# Patient Record
Sex: Male | Born: 1950
Health system: Southern US, Community
[De-identification: ages and names within clinical notes are randomized; demographics above are authoritative.]

## PROBLEM LIST (undated history)

## (undated) DIAGNOSIS — R972 Elevated prostate specific antigen [PSA]: Secondary | ICD-10-CM

## (undated) DIAGNOSIS — K649 Unspecified hemorrhoids: Secondary | ICD-10-CM

## (undated) DIAGNOSIS — G20 Parkinson's disease: Secondary | ICD-10-CM

## (undated) DIAGNOSIS — I2510 Atherosclerotic heart disease of native coronary artery without angina pectoris: Secondary | ICD-10-CM

## (undated) DIAGNOSIS — F32A Depression, unspecified: Secondary | ICD-10-CM

## (undated) DIAGNOSIS — Z9289 Personal history of other medical treatment: Secondary | ICD-10-CM

## (undated) DIAGNOSIS — Z87448 Personal history of other diseases of urinary system: Secondary | ICD-10-CM

## (undated) DIAGNOSIS — I5189 Other ill-defined heart diseases: Secondary | ICD-10-CM

## (undated) DIAGNOSIS — M51369 Other intervertebral disc degeneration, lumbar region without mention of lumbar back pain or lower extremity pain: Secondary | ICD-10-CM

## (undated) DIAGNOSIS — R0600 Dyspnea, unspecified: Secondary | ICD-10-CM

## (undated) DIAGNOSIS — F410 Panic disorder [episodic paroxysmal anxiety] without agoraphobia: Secondary | ICD-10-CM

## (undated) DIAGNOSIS — R351 Nocturia: Secondary | ICD-10-CM

## (undated) DIAGNOSIS — E785 Hyperlipidemia, unspecified: Secondary | ICD-10-CM

## (undated) DIAGNOSIS — N3000 Acute cystitis without hematuria: Secondary | ICD-10-CM

## (undated) DIAGNOSIS — I639 Cerebral infarction, unspecified: Secondary | ICD-10-CM

## (undated) DIAGNOSIS — F319 Bipolar disorder, unspecified: Secondary | ICD-10-CM

## (undated) DIAGNOSIS — I119 Hypertensive heart disease without heart failure: Secondary | ICD-10-CM

## (undated) DIAGNOSIS — E559 Vitamin D deficiency, unspecified: Secondary | ICD-10-CM

## (undated) DIAGNOSIS — G629 Polyneuropathy, unspecified: Secondary | ICD-10-CM

## (undated) DIAGNOSIS — I10 Essential (primary) hypertension: Secondary | ICD-10-CM

## (undated) DIAGNOSIS — G20A1 Parkinson's disease without dyskinesia, without mention of fluctuations: Secondary | ICD-10-CM

## (undated) DIAGNOSIS — M1990 Unspecified osteoarthritis, unspecified site: Secondary | ICD-10-CM

## (undated) DIAGNOSIS — E119 Type 2 diabetes mellitus without complications: Secondary | ICD-10-CM

## (undated) DIAGNOSIS — R42 Dizziness and giddiness: Secondary | ICD-10-CM

## (undated) DIAGNOSIS — G5600 Carpal tunnel syndrome, unspecified upper limb: Secondary | ICD-10-CM

## (undated) DIAGNOSIS — R399 Unspecified symptoms and signs involving the genitourinary system: Secondary | ICD-10-CM

## (undated) DIAGNOSIS — R1319 Other dysphagia: Secondary | ICD-10-CM

## (undated) DIAGNOSIS — F329 Major depressive disorder, single episode, unspecified: Secondary | ICD-10-CM

## (undated) DIAGNOSIS — I209 Angina pectoris, unspecified: Secondary | ICD-10-CM

## (undated) DIAGNOSIS — N400 Enlarged prostate without lower urinary tract symptoms: Secondary | ICD-10-CM

## (undated) DIAGNOSIS — N35919 Unspecified urethral stricture, male, unspecified site: Secondary | ICD-10-CM

## (undated) DIAGNOSIS — F429 Obsessive-compulsive disorder, unspecified: Secondary | ICD-10-CM

## (undated) DIAGNOSIS — K219 Gastro-esophageal reflux disease without esophagitis: Secondary | ICD-10-CM

## (undated) DIAGNOSIS — G4730 Sleep apnea, unspecified: Secondary | ICD-10-CM

## (undated) DIAGNOSIS — M5136 Other intervertebral disc degeneration, lumbar region: Secondary | ICD-10-CM

## (undated) DIAGNOSIS — D649 Anemia, unspecified: Secondary | ICD-10-CM

## (undated) DIAGNOSIS — R339 Retention of urine, unspecified: Secondary | ICD-10-CM

## (undated) DIAGNOSIS — R109 Unspecified abdominal pain: Secondary | ICD-10-CM

## (undated) DIAGNOSIS — D509 Iron deficiency anemia, unspecified: Secondary | ICD-10-CM

## (undated) DIAGNOSIS — Z87440 Personal history of urinary (tract) infections: Secondary | ICD-10-CM

## (undated) DIAGNOSIS — R319 Hematuria, unspecified: Secondary | ICD-10-CM

## (undated) DIAGNOSIS — Z87438 Personal history of other diseases of male genital organs: Secondary | ICD-10-CM

## (undated) DIAGNOSIS — I8390 Asymptomatic varicose veins of unspecified lower extremity: Secondary | ICD-10-CM

## (undated) DIAGNOSIS — J45909 Unspecified asthma, uncomplicated: Secondary | ICD-10-CM

## (undated) DIAGNOSIS — Z972 Presence of dental prosthetic device (complete) (partial): Secondary | ICD-10-CM

## (undated) DIAGNOSIS — I219 Acute myocardial infarction, unspecified: Secondary | ICD-10-CM

## (undated) HISTORY — DX: Vitamin D deficiency, unspecified: E55.9

## (undated) HISTORY — DX: Unspecified hemorrhoids: K64.9

## (undated) HISTORY — DX: Gastro-esophageal reflux disease without esophagitis: K21.9

## (undated) HISTORY — DX: Personal history of other medical treatment: Z92.89

## (undated) HISTORY — DX: Obsessive-compulsive disorder, unspecified: F42.9

## (undated) HISTORY — PX: SKIN GRAFT: SHX250

## (undated) HISTORY — DX: Other intervertebral disc degeneration, lumbar region without mention of lumbar back pain or lower extremity pain: M51.369

## (undated) HISTORY — DX: Unspecified abdominal pain: R10.9

## (undated) HISTORY — DX: Unspecified osteoarthritis, unspecified site: M19.90

## (undated) HISTORY — PX: BACK SURGERY: SHX140

## (undated) HISTORY — DX: Sleep apnea, unspecified: G47.30

## (undated) HISTORY — DX: Hyperlipidemia, unspecified: E78.5

## (undated) HISTORY — DX: Iron deficiency anemia, unspecified: D50.9

## (undated) HISTORY — DX: Nocturia: R35.1

## (undated) HISTORY — DX: Acute cystitis without hematuria: N30.00

## (undated) HISTORY — DX: Asymptomatic varicose veins of unspecified lower extremity: I83.90

## (undated) HISTORY — PX: CARDIAC CATHETERIZATION: SHX172

## (undated) HISTORY — DX: Elevated prostate specific antigen (PSA): R97.20

## (undated) HISTORY — DX: Personal history of other diseases of urinary system: Z87.448

## (undated) HISTORY — DX: Polyneuropathy, unspecified: G62.9

## (undated) HISTORY — DX: Retention of urine, unspecified: R33.9

## (undated) HISTORY — DX: Hematuria, unspecified: R31.9

## (undated) HISTORY — DX: Other dysphagia: R13.19

## (undated) HISTORY — PX: TONSILLECTOMY: SUR1361

## (undated) HISTORY — PX: UVULOPALATOPHARYNGOPLASTY: SHX827

## (undated) HISTORY — DX: Panic disorder (episodic paroxysmal anxiety): F41.0

## (undated) HISTORY — DX: Essential (primary) hypertension: I10

## (undated) HISTORY — DX: Unspecified urethral stricture, male, unspecified site: N35.919

## (undated) HISTORY — PX: CORONARY ARTERY BYPASS GRAFT: SHX141

## (undated) HISTORY — DX: Personal history of other diseases of male genital organs: Z87.438

## (undated) HISTORY — DX: Type 2 diabetes mellitus without complications: E11.9

## (undated) HISTORY — DX: Bipolar disorder, unspecified: F31.9

## (undated) HISTORY — DX: Hypertensive heart disease without heart failure: I11.9

## (undated) HISTORY — DX: Personal history of urinary (tract) infections: Z87.440

## (undated) HISTORY — PX: CORONARY ANGIOPLASTY: SHX604

## (undated) HISTORY — PX: CYST REMOVAL LEG: SHX6280

## (undated) HISTORY — DX: Carpal tunnel syndrome, unspecified upper limb: G56.00

## (undated) HISTORY — DX: Anemia, unspecified: D64.9

## (undated) HISTORY — DX: Unspecified symptoms and signs involving the genitourinary system: R39.9

## (undated) HISTORY — DX: Benign prostatic hyperplasia without lower urinary tract symptoms: N40.0

## (undated) HISTORY — DX: Other intervertebral disc degeneration, lumbar region: M51.36

## (undated) HISTORY — DX: Other ill-defined heart diseases: I51.89

---
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
## 1982-03-28 HISTORY — PX: BACK SURGERY: SHX140

## 2001-05-30 ENCOUNTER — Encounter: Admission: RE | Admit: 2001-05-30 | Discharge: 2001-05-30 | Payer: Self-pay | Admitting: *Deleted

## 2001-06-29 ENCOUNTER — Encounter: Admission: RE | Admit: 2001-06-29 | Discharge: 2001-06-29 | Payer: Self-pay | Admitting: *Deleted

## 2001-09-03 ENCOUNTER — Encounter: Admission: RE | Admit: 2001-09-03 | Discharge: 2001-09-03 | Payer: Self-pay | Admitting: *Deleted

## 2001-11-28 ENCOUNTER — Encounter: Admission: RE | Admit: 2001-11-28 | Discharge: 2001-11-28 | Payer: Self-pay | Admitting: *Deleted

## 2002-02-27 ENCOUNTER — Encounter: Admission: RE | Admit: 2002-02-27 | Discharge: 2002-02-27 | Payer: Self-pay | Admitting: *Deleted

## 2002-04-24 ENCOUNTER — Inpatient Hospital Stay (HOSPITAL_COMMUNITY): Admission: EM | Admit: 2002-04-24 | Discharge: 2002-04-27 | Payer: Self-pay | Admitting: *Deleted

## 2002-05-01 ENCOUNTER — Encounter: Admission: RE | Admit: 2002-05-01 | Discharge: 2002-05-01 | Payer: Self-pay | Admitting: *Deleted

## 2002-05-27 ENCOUNTER — Encounter: Admission: RE | Admit: 2002-05-27 | Discharge: 2002-05-27 | Payer: Self-pay | Admitting: *Deleted

## 2002-07-22 ENCOUNTER — Encounter: Admission: RE | Admit: 2002-07-22 | Discharge: 2002-07-22 | Payer: Self-pay | Admitting: *Deleted

## 2003-12-03 ENCOUNTER — Ambulatory Visit (HOSPITAL_COMMUNITY): Payer: Self-pay | Admitting: Psychiatry

## 2004-02-04 ENCOUNTER — Ambulatory Visit (HOSPITAL_COMMUNITY): Payer: Self-pay | Admitting: Psychiatry

## 2004-04-09 ENCOUNTER — Ambulatory Visit: Payer: Self-pay | Admitting: Internal Medicine

## 2004-05-19 ENCOUNTER — Ambulatory Visit (HOSPITAL_COMMUNITY): Payer: Self-pay | Admitting: Psychiatry

## 2004-06-08 ENCOUNTER — Ambulatory Visit: Payer: Self-pay | Admitting: Urology

## 2004-06-11 ENCOUNTER — Ambulatory Visit: Payer: Self-pay | Admitting: Urology

## 2004-07-26 ENCOUNTER — Ambulatory Visit: Payer: Self-pay | Admitting: Urology

## 2004-08-04 ENCOUNTER — Ambulatory Visit (HOSPITAL_COMMUNITY): Payer: Self-pay | Admitting: Psychiatry

## 2004-09-29 ENCOUNTER — Ambulatory Visit: Payer: Self-pay | Admitting: Internal Medicine

## 2004-10-05 ENCOUNTER — Ambulatory Visit: Payer: Self-pay | Admitting: Internal Medicine

## 2004-10-11 ENCOUNTER — Ambulatory Visit (HOSPITAL_COMMUNITY): Payer: Self-pay | Admitting: Psychiatry

## 2004-11-11 ENCOUNTER — Ambulatory Visit: Payer: Self-pay | Admitting: Urology

## 2004-11-15 ENCOUNTER — Ambulatory Visit: Payer: Self-pay | Admitting: Urology

## 2005-02-08 ENCOUNTER — Other Ambulatory Visit: Payer: Self-pay

## 2005-02-08 ENCOUNTER — Ambulatory Visit: Payer: Self-pay | Admitting: Urology

## 2005-02-14 ENCOUNTER — Ambulatory Visit: Payer: Self-pay | Admitting: Urology

## 2005-04-16 ENCOUNTER — Ambulatory Visit: Payer: Self-pay | Admitting: Internal Medicine

## 2005-06-01 ENCOUNTER — Ambulatory Visit (HOSPITAL_COMMUNITY): Payer: Self-pay | Admitting: Psychiatry

## 2005-08-11 ENCOUNTER — Ambulatory Visit: Payer: Self-pay

## 2005-10-27 ENCOUNTER — Ambulatory Visit (HOSPITAL_COMMUNITY): Payer: Self-pay | Admitting: Psychiatry

## 2005-11-17 ENCOUNTER — Ambulatory Visit: Payer: Self-pay | Admitting: Gastroenterology

## 2006-07-31 ENCOUNTER — Ambulatory Visit: Payer: Self-pay | Admitting: Urology

## 2006-12-29 ENCOUNTER — Ambulatory Visit: Payer: Self-pay | Admitting: Internal Medicine

## 2007-01-03 ENCOUNTER — Ambulatory Visit: Payer: Self-pay | Admitting: Unknown Physician Specialty

## 2007-01-30 ENCOUNTER — Inpatient Hospital Stay: Payer: Self-pay | Admitting: Internal Medicine

## 2007-01-30 ENCOUNTER — Other Ambulatory Visit: Payer: Self-pay

## 2007-03-29 HISTORY — PX: CARPAL TUNNEL RELEASE: SHX101

## 2007-04-22 ENCOUNTER — Ambulatory Visit: Payer: Self-pay | Admitting: Unknown Physician Specialty

## 2007-06-28 ENCOUNTER — Ambulatory Visit: Payer: Self-pay | Admitting: Orthopaedic Surgery

## 2007-06-28 ENCOUNTER — Other Ambulatory Visit: Payer: Self-pay

## 2007-07-05 ENCOUNTER — Ambulatory Visit: Payer: Self-pay | Admitting: Orthopaedic Surgery

## 2007-08-18 ENCOUNTER — Inpatient Hospital Stay: Payer: Self-pay | Admitting: Internal Medicine

## 2007-08-18 ENCOUNTER — Other Ambulatory Visit: Payer: Self-pay

## 2007-08-23 ENCOUNTER — Other Ambulatory Visit: Payer: Self-pay

## 2007-08-23 ENCOUNTER — Inpatient Hospital Stay: Payer: Self-pay | Admitting: Internal Medicine

## 2007-09-14 ENCOUNTER — Emergency Department: Payer: Self-pay

## 2007-09-14 ENCOUNTER — Other Ambulatory Visit: Payer: Self-pay

## 2007-10-02 ENCOUNTER — Ambulatory Visit: Payer: Self-pay | Admitting: Otolaryngology

## 2007-10-08 ENCOUNTER — Inpatient Hospital Stay: Payer: Self-pay | Admitting: Otolaryngology

## 2007-10-11 ENCOUNTER — Inpatient Hospital Stay: Payer: Self-pay | Admitting: Otolaryngology

## 2008-08-14 ENCOUNTER — Emergency Department: Payer: Self-pay | Admitting: Unknown Physician Specialty

## 2008-09-03 ENCOUNTER — Ambulatory Visit: Payer: Self-pay | Admitting: Cardiovascular Disease

## 2008-10-23 ENCOUNTER — Ambulatory Visit: Payer: Self-pay | Admitting: Internal Medicine

## 2008-12-03 ENCOUNTER — Inpatient Hospital Stay: Payer: Self-pay | Admitting: Psychiatry

## 2008-12-30 ENCOUNTER — Ambulatory Visit: Payer: Self-pay | Admitting: Internal Medicine

## 2009-08-05 ENCOUNTER — Ambulatory Visit: Payer: Self-pay | Admitting: Cardiovascular Disease

## 2010-01-15 ENCOUNTER — Ambulatory Visit: Payer: Self-pay | Admitting: Specialist

## 2010-02-03 ENCOUNTER — Ambulatory Visit: Payer: Self-pay | Admitting: Otolaryngology

## 2010-03-03 ENCOUNTER — Ambulatory Visit: Payer: Self-pay | Admitting: Gastroenterology

## 2010-03-05 LAB — PATHOLOGY REPORT

## 2010-04-15 ENCOUNTER — Ambulatory Visit: Payer: Self-pay | Admitting: Anesthesiology

## 2010-10-27 ENCOUNTER — Inpatient Hospital Stay: Payer: Self-pay | Admitting: Internal Medicine

## 2010-11-03 ENCOUNTER — Ambulatory Visit (HOSPITAL_COMMUNITY): Payer: Self-pay | Admitting: Psychiatry

## 2010-11-11 ENCOUNTER — Encounter: Payer: Self-pay | Admitting: Cardiovascular Disease

## 2010-11-14 ENCOUNTER — Inpatient Hospital Stay: Payer: Self-pay | Admitting: Internal Medicine

## 2010-11-20 ENCOUNTER — Emergency Department: Payer: Self-pay | Admitting: *Deleted

## 2010-11-30 ENCOUNTER — Ambulatory Visit (HOSPITAL_COMMUNITY): Payer: Self-pay | Admitting: Physician Assistant

## 2010-12-20 ENCOUNTER — Emergency Department: Payer: Self-pay | Admitting: *Deleted

## 2010-12-28 ENCOUNTER — Inpatient Hospital Stay: Payer: Self-pay | Admitting: Internal Medicine

## 2010-12-28 DIAGNOSIS — R072 Precordial pain: Secondary | ICD-10-CM

## 2011-05-31 ENCOUNTER — Ambulatory Visit: Admit: 2011-05-31 | Payer: Self-pay | Admitting: Surgery

## 2011-05-31 SURGERY — GASTRIC BANDING, LAPAROSCOPIC
Anesthesia: General

## 2011-06-25 ENCOUNTER — Inpatient Hospital Stay: Payer: Self-pay | Admitting: Internal Medicine

## 2011-06-25 LAB — PROTIME-INR
INR: 1.1
Prothrombin Time: 14.5 secs (ref 11.5–14.7)

## 2011-06-25 LAB — COMPREHENSIVE METABOLIC PANEL WITH GFR
Albumin: 3.4 g/dL
Alkaline Phosphatase: 63 U/L
Anion Gap: 10
BUN: 19 mg/dL — ABNORMAL HIGH
Bilirubin,Total: 0.4 mg/dL
Calcium, Total: 8.2 mg/dL — ABNORMAL LOW
Chloride: 113 mmol/L — ABNORMAL HIGH
Co2: 22 mmol/L
Creatinine: 0.95 mg/dL
EGFR (African American): >60
EGFR (Non-African Amer.): >60
Glucose: 109 mg/dL — ABNORMAL HIGH
Osmolality: 292
Potassium: 3.3 mmol/L — ABNORMAL LOW
SGOT(AST): 20 U/L
SGPT (ALT): 20 U/L
Sodium: 145 mmol/L
Total Protein: 6 g/dL — ABNORMAL LOW

## 2011-06-25 LAB — ETHANOL
Ethanol %: <0.003 % (ref 0.000–0.080)
Ethanol: <3 mg/dL

## 2011-06-25 LAB — CBC
HCT: 32.8 % — ABNORMAL LOW (ref 40.0–52.0)
HGB: 11.2 g/dL — ABNORMAL LOW (ref 13.0–18.0)
MCH: 30.7 pg (ref 26.0–34.0)
MCV: 90 fL (ref 80–100)
Platelet: 156 10*3/uL (ref 150–440)
RBC: 3.65 10*6/uL — ABNORMAL LOW (ref 4.40–5.90)
RDW: 14 % (ref 11.5–14.5)

## 2011-06-25 LAB — URINALYSIS, COMPLETE
Bilirubin,UR: NEGATIVE
Ketone: NEGATIVE
Nitrite: POSITIVE
Ph: 7 (ref 4.5–8.0)
Protein: NEGATIVE
Specific Gravity: 1.012 (ref 1.003–1.030)
Squamous Epithelial: 1
WBC UR: 15 /HPF (ref 0–5)

## 2011-06-25 LAB — DRUG SCREEN, URINE
Amphetamines, Ur Screen: NEGATIVE (ref ?–1000)
Benzodiazepine, Ur Scrn: NEGATIVE (ref ?–200)
Cannabinoid 50 Ng, Ur ~~LOC~~: NEGATIVE (ref ?–50)
Cocaine Metabolite,Ur ~~LOC~~: NEGATIVE (ref ?–300)
MDMA (Ecstasy)Ur Screen: NEGATIVE (ref ?–500)

## 2011-06-25 LAB — AMMONIA: Ammonia, Plasma: 35 umol/L — ABNORMAL HIGH

## 2011-06-25 LAB — TROPONIN I
Troponin-I: 0.09 ng/mL — ABNORMAL HIGH
Troponin-I: 0.09 ng/mL — ABNORMAL HIGH

## 2011-06-25 LAB — ACETAMINOPHEN LEVEL: Acetaminophen: 3 ug/mL — ABNORMAL LOW

## 2011-07-06 ENCOUNTER — Ambulatory Visit: Payer: Medicare Other | Admitting: Cardiovascular Disease

## 2011-08-05 ENCOUNTER — Ambulatory Visit: Payer: Medicare Other | Admitting: Cardiovascular Disease

## 2011-09-03 DIAGNOSIS — I252 Old myocardial infarction: Secondary | ICD-10-CM | POA: Insufficient documentation

## 2011-09-03 DIAGNOSIS — J302 Other seasonal allergic rhinitis: Secondary | ICD-10-CM | POA: Insufficient documentation

## 2011-09-08 ENCOUNTER — Emergency Department: Payer: Self-pay | Admitting: Emergency Medicine

## 2011-09-08 LAB — DRUG SCREEN, URINE
Amphetamines, Ur Screen: NEGATIVE (ref ?–1000)
Barbiturates, Ur Screen: NEGATIVE (ref ?–200)
Benzodiazepine, Ur Scrn: NEGATIVE (ref ?–200)
Cannabinoid 50 Ng, Ur ~~LOC~~: NEGATIVE (ref ?–50)
Cocaine Metabolite,Ur ~~LOC~~: NEGATIVE (ref ?–300)
MDMA (Ecstasy)Ur Screen: NEGATIVE (ref ?–500)
Methadone, Ur Screen: NEGATIVE (ref ?–300)
Opiate, Ur Screen: NEGATIVE (ref ?–300)
Tricyclic, Ur Screen: POSITIVE (ref ?–1000)

## 2011-09-08 LAB — URINALYSIS, COMPLETE
Bilirubin,UR: NEGATIVE
Glucose,UR: NEGATIVE mg/dL (ref 0–75)
Ketone: NEGATIVE
Ph: 7 (ref 4.5–8.0)
Protein: 30
RBC,UR: 9 /HPF (ref 0–5)
Specific Gravity: 1.009 (ref 1.003–1.030)
Squamous Epithelial: 1
WBC UR: 84 /HPF (ref 0–5)

## 2011-09-08 LAB — CBC
MCH: 29.9 pg (ref 26.0–34.0)
MCHC: 32.9 g/dL (ref 32.0–36.0)
Platelet: 160 10*3/uL (ref 150–440)
WBC: 5.2 10*3/uL (ref 3.8–10.6)

## 2011-09-08 LAB — ACETAMINOPHEN LEVEL: Acetaminophen: <2 ug/mL

## 2011-09-08 LAB — AMMONIA: Ammonia, Plasma: 27 mcmol/L (ref 11–32)

## 2011-09-08 LAB — COMPREHENSIVE METABOLIC PANEL
Albumin: 3.5 g/dL (ref 3.4–5.0)
Alkaline Phosphatase: 84 U/L (ref 50–136)
Anion Gap: 8 (ref 7–16)
BUN: 24 mg/dL — ABNORMAL HIGH (ref 7–18)
Bilirubin,Total: 0.3 mg/dL (ref 0.2–1.0)
Calcium, Total: 9.1 mg/dL (ref 8.5–10.1)
Co2: 25 mmol/L (ref 21–32)
Creatinine: 1.04 mg/dL (ref 0.60–1.30)
Glucose: 110 mg/dL — ABNORMAL HIGH (ref 65–99)
Potassium: 3.9 mmol/L (ref 3.5–5.1)
Sodium: 141 mmol/L (ref 136–145)
Total Protein: 6.4 g/dL (ref 6.4–8.2)

## 2011-09-08 LAB — ETHANOL
Ethanol %: <0.003 % (ref 0.000–0.080)
Ethanol: <3 mg/dL

## 2011-09-08 LAB — SALICYLATE LEVEL: Salicylates, Serum: <1.7 mg/dL

## 2011-09-08 LAB — TROPONIN I: Troponin-I: 0.07 ng/mL — ABNORMAL HIGH

## 2011-09-08 LAB — CARBAMAZEPINE LEVEL, TOTAL: Carbamazepine: <0.5 ug/mL — ABNORMAL LOW (ref 4.0–12.0)

## 2011-09-08 LAB — TSH: Thyroid Stimulating Horm: 1.73 u[IU]/mL

## 2011-09-08 LAB — PROTIME-INR: INR: 1.1

## 2011-09-10 ENCOUNTER — Emergency Department: Payer: Self-pay | Admitting: Orthopedic Surgery

## 2011-09-11 LAB — URINE CULTURE

## 2011-09-13 ENCOUNTER — Emergency Department: Payer: Self-pay | Admitting: Emergency Medicine

## 2011-09-28 ENCOUNTER — Encounter: Payer: Self-pay | Admitting: *Deleted

## 2011-09-28 ENCOUNTER — Encounter: Payer: Self-pay | Admitting: Cardiovascular Disease

## 2011-09-30 ENCOUNTER — Encounter: Payer: Self-pay | Admitting: Cardiovascular Disease

## 2011-09-30 ENCOUNTER — Ambulatory Visit (INDEPENDENT_AMBULATORY_CARE_PROVIDER_SITE_OTHER): Payer: Medicare Other | Admitting: Cardiovascular Disease

## 2011-09-30 VITALS — BP 148/94 | HR 77 | Ht 77.0 in | Wt 217.0 lb

## 2011-09-30 DIAGNOSIS — Z9861 Coronary angioplasty status: Secondary | ICD-10-CM

## 2011-09-30 DIAGNOSIS — R079 Chest pain, unspecified: Secondary | ICD-10-CM | POA: Insufficient documentation

## 2011-09-30 DIAGNOSIS — E119 Type 2 diabetes mellitus without complications: Secondary | ICD-10-CM | POA: Insufficient documentation

## 2011-09-30 DIAGNOSIS — I2510 Atherosclerotic heart disease of native coronary artery without angina pectoris: Secondary | ICD-10-CM

## 2011-09-30 DIAGNOSIS — E785 Hyperlipidemia, unspecified: Secondary | ICD-10-CM

## 2011-09-30 DIAGNOSIS — Z955 Presence of coronary angioplasty implant and graft: Secondary | ICD-10-CM

## 2011-09-30 MED ORDER — ISOSORBIDE MONONITRATE ER 30 MG PO TB24: 30.0000 mg | ORAL_TABLET | Freq: Two times a day (BID) | ORAL | Status: DC

## 2011-09-30 MED ORDER — RANOLAZINE ER 1000 MG PO TB12: 1000.0000 mg | ORAL_TABLET | Freq: Two times a day (BID) | ORAL | Status: DC

## 2011-09-30 NOTE — Patient Instructions (Addendum)
You are doing well.  Please increase the isosorbide to 30 mg twice a day for chest pain If you continue to have chest pains on a regular basis, Please increase the ranexa to 1000 mg twice a day  Please call us if you have new issues that need to be addressed before your next appt.  Your physician wants you to follow-up in: 6 months.  You will receive a reminder letter in the mail two months in advance. If you don't receive a letter, please call our office to schedule the follow-up appointment.

## 2011-09-30 NOTE — Assessment & Plan Note (Signed)
Chronic chest pain. He has had numerous cardiac catheterizations with patent stent to the left circumflex. No further testing at this time. We will increase his isosorbide to 30 mg twice a day and increase ranexa to 1000 mg twice a day.

## 2011-09-30 NOTE — Progress Notes (Signed)
Patient ID: Roger Pruitt, male    DOB: 1950-04-11, 61 y.o.   MRN: [id number]  HPI Comments: Mr. Hidrogo is a 61 year old gentleman with history of coronary artery disease, stent x2 to his circumflex, hypertension, hyperlipidemia, diabetes, obstructive sleep apnea, chronic pain, bipolar and schizophrenia who presents to establish care. Notes indicate PTCA performed 2001 in [place], mid left circumflex stent bare metal vision stent June 2010 3.0 x 12 mm.  he has a long history of smoking, stopped several years ago.  He reports that he has episodes of chest pain several times per week. He takes nitroglycerin occasionally. He was started on ranexa 500 mg twice a day and has been taking samples. He is otherwise active with no complaints.  Most recent cardiac catheterization October 2012 showing 60% proximal LAD disease, distal LAD 50%, 30% diagonal disease, 40% ostial circumflex and proximal circumflex, 30% proximal and mid RCA, 50% distal RCA, 70% PDA.  He had catheterization also in August 2012 for similar symptoms as well as May 2011  Echocardiogram August 2012 showing essentially normal LV function with moderate LVH, diastolic dysfunction, mild MR Repeat echocardiogram in May 2013 done by Dr. Welton Flakes showing moderately dilated left and right atrium, normal LV systolic function, moderate LVH, diastolic dysfunction, mild MR  Stress test May 2013 showing possible ischemia in the left circumflex and RCA territory with normal ejection fraction Note indicates moderate inferolateral defect with partial reversibility  He had an admission in March 2013 for unresponsiveness felt secondary to medications He has been time in jail. He signed out AMA for the admission in March 2013  EKG shows normal sinus rhythm with rate 77 beats per minute with poor R-wave progression to the anterior precordial leads, left anterior fascicular block   Outpatient Encounter Prescriptions as of 09/30/2011  Medication Sig Dispense  Refill  . aspirin 81 MG tablet Take 81 mg by mouth daily.        Marland Kitchen atorvastatin (LIPITOR) 80 MG tablet Take 80 mg by mouth daily.      Marland Kitchen buPROPion (ZYBAN) 150 MG 12 hr tablet Take 150 mg by mouth 2 (two) times daily.      . cloNIDine (CATAPRES) 0.2 MG tablet Take 0.2 mg by mouth 2 (two) times daily.       . clopidogrel (PLAVIX) 75 MG tablet Take 75 mg by mouth daily.      Marland Kitchen lisinopril (PRINIVIL,ZESTRIL) 10 MG tablet Take 10 mg by mouth daily.      Marland Kitchen LORazepam (ATIVAN) 1 MG tablet Take 1 mg by mouth 4 (four) times daily.       . metoprolol succinate (TOPROL-XL) 25 MG 24 hr tablet Take 25 mg by mouth daily.        . mirtazapine (REMERON) 30 MG tablet Take 30 mg by mouth at bedtime.      . nitroGLYCERIN (NITROSTAT) 0.4 MG SL tablet Place 0.4 mg under the tongue every 5 (five) minutes as needed.      Marland Kitchen oxcarbazepine (TRILEPTAL) 600 MG tablet Take 600 mg by mouth 2 (two) times daily.      . paliperidone (INVEGA) 6 MG 24 hr tablet Take 6 mg by mouth at bedtime.      . paliperidone (INVEGA) 6 MG 24 hr tablet Take 6 mg by mouth daily.      . pantoprazole (PROTONIX) 40 MG tablet Take 40 mg by mouth daily.      . QUEtiapine (SEROQUEL) 400 MG tablet Take 400 mg by mouth  at bedtime.        . Tamsulosin HCl (FLOMAX) 0.4 MG CAPS Take 0.4 mg by mouth daily.      Marland Kitchen topiramate (TOPAMAX) 100 MG tablet Take 100 mg by mouth daily.      . traMADol (ULTRAM) 50 MG tablet Take 50 mg by mouth 3 (three) times daily.       .  isosorbide mononitrate (IMDUR) 30 MG 24 hr tablet Take 30 mg by mouth daily.      . ranolazine (RANEXA) 500 MG 12 hr tablet Take 500 mg by mouth 2 (two) times daily.         Review of Systems  Constitutional: Negative.   HENT: Negative.   Eyes: Negative.   Respiratory: Negative.   Cardiovascular: Positive for chest pain.  Gastrointestinal: Negative.   Musculoskeletal: Negative.   Skin: Negative.   Neurological: Negative.   Hematological: Negative.   Psychiatric/Behavioral: Negative.     All other systems reviewed and are negative.    BP 148/94  Pulse 77  Ht 6\' 5"  (1.956 m)  Wt 217 lb (98.431 kg)  BMI 25.73 kg/m2  Physical Exam  Nursing note and vitals reviewed. Constitutional: He is oriented to person, place, and time. He appears well-developed and well-nourished.  HENT:  Head: Normocephalic.  Nose: Nose normal.  Mouth/Throat: Oropharynx is clear and moist.  Eyes: Conjunctivae are normal. Pupils are equal, round, and reactive to light.  Neck: Normal range of motion. Neck supple. No JVD present.  Cardiovascular: Normal rate, regular rhythm, S1 normal, S2 normal, normal heart sounds and intact distal pulses.  Exam reveals no gallop and no friction rub.   No murmur heard. Pulmonary/Chest: Effort normal and breath sounds normal. No respiratory distress. He has no wheezes. He has no rales. He exhibits no tenderness.  Abdominal: Soft. Bowel sounds are normal. He exhibits no distension. There is no tenderness.  Musculoskeletal: Normal range of motion. He exhibits no edema and no tenderness.  Lymphadenopathy:    He has no cervical adenopathy.  Neurological: He is alert and oriented to person, place, and time. Coordination normal.  Skin: Skin is warm and dry. No rash noted. No erythema.  Psychiatric: He has a normal mood and affect. His behavior is normal. Judgment and thought content normal.           Assessment and Plan

## 2011-09-30 NOTE — Assessment & Plan Note (Signed)
We have encouraged continued exercise, careful diet management in an effort to lose weight. 

## 2011-09-30 NOTE — Assessment & Plan Note (Signed)
Diffuse mild to moderate disease with patent stent by catheterization in 2012. Medical management at this time.

## 2011-09-30 NOTE — Assessment & Plan Note (Signed)
Continue aggressive cholesterol management. Goal LDL less than 70 

## 2012-05-26 HISTORY — PX: CARDIAC CATHETERIZATION: SHX172

## 2012-06-06 ENCOUNTER — Telehealth: Payer: Self-pay

## 2012-06-06 NOTE — Telephone Encounter (Signed)
Dr. Dario Guardian called to say pt has appt with Dr. Mariah Milling at end of month but wants pt to be seen this week by Dr. Kirke Corin for abnormal stress test Says he saw Dr. Kirke Corin in past and wants pt to see him  I offered an appt with Dr. Kirke Corin today in [facility] at 4:15 but Dr. Dario Guardian says pt not willing to go to [place] Would be ok with Friday appt  i scheduled pt for Friday 3/14 at 3:45 pm Devon at dr. Aurelio Brash office made aware I will also let Dr. Kirke Corin know and will call pt back with different time if not ok with him  I attempted to page Dr. Kirke Corin but he is in the middle of a heart cath

## 2012-06-06 NOTE — Telephone Encounter (Signed)
Friday is fine

## 2012-06-07 ENCOUNTER — Encounter: Payer: Self-pay | Admitting: *Deleted

## 2012-06-08 ENCOUNTER — Ambulatory Visit: Payer: Self-pay | Admitting: Cardiovascular Disease

## 2012-06-08 ENCOUNTER — Encounter: Payer: Self-pay | Admitting: Cardiovascular Disease

## 2012-06-08 ENCOUNTER — Ambulatory Visit (INDEPENDENT_AMBULATORY_CARE_PROVIDER_SITE_OTHER): Payer: Medicare Other | Admitting: Cardiovascular Disease

## 2012-06-08 VITALS — BP 120/90 | HR 58 | Ht 75.0 in | Wt 234.5 lb

## 2012-06-08 DIAGNOSIS — R000 Tachycardia, unspecified: Secondary | ICD-10-CM

## 2012-06-08 DIAGNOSIS — I2510 Atherosclerotic heart disease of native coronary artery without angina pectoris: Secondary | ICD-10-CM

## 2012-06-08 DIAGNOSIS — R079 Chest pain, unspecified: Secondary | ICD-10-CM

## 2012-06-08 DIAGNOSIS — E785 Hyperlipidemia, unspecified: Secondary | ICD-10-CM

## 2012-06-08 NOTE — Patient Instructions (Addendum)
[facility] Cardiac Cath

## 2012-06-09 LAB — CBC WITH DIFFERENTIAL
Basos: 1 % (ref 0–3)
HCT: 37.6 % (ref 37.5–51.0)
Hemoglobin: 12.8 g/dL (ref 12.6–17.7)
Immature Grans (Abs): 0 10*3/uL (ref 0.0–0.1)
Lymphs: 26 % (ref 14–46)
Monocytes: 7 % (ref 4–12)
Neutrophils Absolute: 3.8 10*3/uL (ref 1.4–7.0)
RBC: 4.19 x10E6/uL (ref 4.14–5.80)
WBC: 6 10*3/uL (ref 3.4–10.8)

## 2012-06-09 LAB — BASIC METABOLIC PANEL
BUN: 13 mg/dL (ref 8–27)
Calcium: 9.3 mg/dL (ref 8.6–10.2)
Creatinine, Ser: 1.16 mg/dL (ref 0.76–1.27)
GFR calc non Af Amer: 68 mL/min/{1.73_m2} (ref >59)
Glucose: 100 mg/dL — ABNORMAL HIGH (ref 65–99)

## 2012-06-09 LAB — PROTIME-INR: Prothrombin Time: 11.1 s (ref 9.1–12.0)

## 2012-06-11 ENCOUNTER — Encounter: Payer: Self-pay | Admitting: Cardiovascular Disease

## 2012-06-11 NOTE — Progress Notes (Signed)
Primary Care physician: Dr. Dario Guardian HPI  This is a 62 year old male who is here today for followup regarding an abnormal stress test. He has no history of coronary artery disease, stent x2 to his circumflex, hypertension, hyperlipidemia, diabetes, obstructive sleep apnea, chronic pain, bipolar and schizophrenia. He had previous PTCA performed 2001 in [place], mid left circumflex stent bare metal vision stent June 2010 3.0 x 12 mm.  I performed stent placement in the left circumflex in 2011.  Most recent cardiac catheterization October 2012 showing 60% proximal LAD disease, distal LAD 50%, 30% diagonal disease, 40% ostial circumflex and proximal circumflex, 30% proximal and mid RCA, 50% distal RCA, 70% PDA. He had catheterization also in August 2012 for similar symptoms as well as May 2011. He is known to have chronic angina reasonably controlled with medications. However, he reports significant worsening over the last few months. He gets substernal chest tightness and dyspnea with mild to moderate activities. He underwent a recent nuclear stress test which showed severe anterolateral ischemia with normal ejection fraction.  He had an admission in March 2013 for unresponsiveness felt secondary to medications   Allergies  Allergen Reactions  . Morphine And Related      Current Outpatient Prescriptions on File Prior to Visit  Medication Sig Dispense Refill  . atorvastatin (LIPITOR) 80 MG tablet Take 80 mg by mouth daily.      Marland Kitchen buPROPion (ZYBAN) 150 MG 12 hr tablet Take 150 mg by mouth daily.       . cloNIDine (CATAPRES) 0.2 MG tablet Take 0.2 mg by mouth 3 (three) times daily.       . clopidogrel (PLAVIX) 75 MG tablet Take 75 mg by mouth daily.      . ergocalciferol (VITAMIN D2) 50000 UNITS capsule Take 50,000 Units by mouth once a week.      . isosorbide mononitrate (IMDUR) 30 MG 24 hr tablet Take 1 tablet (30 mg total) by mouth 2 (two) times daily.  60 tablet  11  . lisinopril  (PRINIVIL,ZESTRIL) 10 MG tablet Take 10 mg by mouth daily.      Marland Kitchen LORazepam (ATIVAN) 1 MG tablet Take 1 mg by mouth 3 (three) times daily.       . metoprolol succinate (TOPROL-XL) 25 MG 24 hr tablet Take 25 mg by mouth daily.        . mirtazapine (REMERON) 30 MG tablet Take 30 mg by mouth at bedtime.      . nitroGLYCERIN (NITROSTAT) 0.4 MG SL tablet Place 0.4 mg under the tongue every 5 (five) minutes as needed.      Marland Kitchen oxcarbazepine (TRILEPTAL) 600 MG tablet Take 600 mg by mouth daily.       . paliperidone (INVEGA) 6 MG 24 hr tablet Take 6 mg by mouth daily.      . pantoprazole (PROTONIX) 40 MG tablet Take 40 mg by mouth daily.      . QUEtiapine (SEROQUEL) 400 MG tablet Take 400 mg by mouth at bedtime.        . ranolazine (RANEXA) 1000 MG SR tablet Take 1 tablet (1,000 mg total) by mouth 2 (two) times daily.  60 tablet  11  . Tamsulosin HCl (FLOMAX) 0.4 MG CAPS Take 0.4 mg by mouth daily.      Marland Kitchen topiramate (TOPAMAX) 100 MG tablet Take 100 mg by mouth daily.      . traMADol (ULTRAM) 50 MG tablet Take 50 mg by mouth 4 (four) times daily.  No current facility-administered medications on file prior to visit.     Past Medical History  Diagnosis Date  . Hypertension   . Diabetes mellitus     Type II  . Coronary artery disease   . Sleep apnea   . Esophageal reflux   . Disc degeneration, lumbar   . Bipolar disorder   . Hyperlipidemia   . MI (myocardial infarction) 2001;2012;2012  . Arthritis   . BPD (bronchopulmonary dysplasia)   . Panic attack   . Neuropathy   . Obsessive compulsive disorder   . Carpal tunnel syndrome   . History of bladder infections   . Unspecified vitamin D deficiency   . Other dysphagia   . Iron deficiency anemia, unspecified      Past Surgical History  Procedure Laterality Date  . Tonsillectomy    . Back surgery    . Skin graft    . Carpal tunnel release  2009    left  . Cardiac catheterization  6/10;2011;Aug.2012;Oct. 2012;   . Coronary  angioplasty  2011 & 2001    s/p stent     Family History  Problem Relation Age of Onset  . Heart disease Mother   . Heart disease Brother   . Heart disease Brother      History   Social History  . Marital Status: Married    Spouse Name: N/A    Number of Children: N/A  . Years of Education: N/A   Occupational History  . Not on file.   Social History Main Topics  . Smoking status: Former Smoker -- 15 years    Types: Cigarettes    Quit date: 05/17/2000  . Smokeless tobacco: Not on file  . Alcohol Use: No  . Drug Use: No  . Sexually Active:    Other Topics Concern  . Not on file   Social History Narrative  . No narrative on file     PHYSICAL EXAM   BP 120/90  Pulse 58  Ht 6\' 3"  (1.905 m)  Wt 234 lb 8 oz (106.369 kg)  BMI 29.31 kg/m2  Constitutional: He is oriented to person, place, and time. He appears well-developed and well-nourished. No distress.  HENT: No nasal discharge.  Head: Normocephalic and atraumatic.  Eyes: Pupils are equal and round. Right eye exhibits no discharge. Left eye exhibits no discharge.  Neck: Normal range of motion. Neck supple. No JVD present. No thyromegaly present.  Cardiovascular: Normal rate, regular rhythm, normal heart sounds and. Exam reveals no gallop and no friction rub. No murmur heard.  Pulmonary/Chest: Effort normal and breath sounds normal. No stridor. No respiratory distress. He has no wheezes. He has no rales. He exhibits no tenderness.  Abdominal: Soft. Bowel sounds are normal. He exhibits no distension. There is no tenderness. There is no rebound and no guarding.  Musculoskeletal: Normal range of motion. He exhibits no edema and no tenderness.  Neurological: He is alert and oriented to person, place, and time. Coordination normal.  Skin: Skin is warm and dry. No rash noted. He is not diaphoretic. No erythema. No pallor.  Psychiatric: He has a normal mood and affect. His behavior is normal. Judgment and thought content  normal.      ZOX:WRUEA  Bradycardia  -First degree A-V block  PRi = 224 -Nonspecific QRS widening and anterior fascicular block.   ABNORMAL    ASSESSMENT AND PLAN

## 2012-06-11 NOTE — Assessment & Plan Note (Signed)
Continue treatment with atorvastatin. 

## 2012-06-11 NOTE — Assessment & Plan Note (Signed)
Roger Pruitt is currently having symptoms suggestive of class III angina in spite of optimal medical therapy. His recent nuclear stress test was abnormal and showed evidence of inferolateral ischemia with normal ejection fraction. His most recent cardiac catheterization also showed borderline significant disease in the left anterior descending artery. Due to that, I recommend proceeding with cardiac catheterization and possible coronary intervention. Risks, benefits and alternatives were discussed with the patient.

## 2012-06-14 ENCOUNTER — Ambulatory Visit: Payer: Medicare Other | Admitting: Cardiovascular Disease

## 2012-06-22 ENCOUNTER — Encounter: Payer: Self-pay | Admitting: Cardiovascular Disease

## 2012-06-22 ENCOUNTER — Ambulatory Visit: Payer: Self-pay | Admitting: Cardiovascular Disease

## 2012-06-22 DIAGNOSIS — I2510 Atherosclerotic heart disease of native coronary artery without angina pectoris: Secondary | ICD-10-CM

## 2012-06-28 ENCOUNTER — Encounter: Payer: Self-pay | Admitting: *Deleted

## 2012-06-29 ENCOUNTER — Ambulatory Visit: Payer: Self-pay | Admitting: Internal Medicine

## 2012-07-10 ENCOUNTER — Ambulatory Visit (INDEPENDENT_AMBULATORY_CARE_PROVIDER_SITE_OTHER): Payer: Medicare Other | Admitting: Cardiovascular Disease

## 2012-07-10 ENCOUNTER — Encounter: Payer: Self-pay | Admitting: Cardiovascular Disease

## 2012-07-10 VITALS — BP 168/70 | HR 62 | Ht 75.0 in | Wt 234.2 lb

## 2012-07-10 DIAGNOSIS — R079 Chest pain, unspecified: Secondary | ICD-10-CM

## 2012-07-10 DIAGNOSIS — R0602 Shortness of breath: Secondary | ICD-10-CM

## 2012-07-10 DIAGNOSIS — I10 Essential (primary) hypertension: Secondary | ICD-10-CM

## 2012-07-10 DIAGNOSIS — E785 Hyperlipidemia, unspecified: Secondary | ICD-10-CM

## 2012-07-10 DIAGNOSIS — I2510 Atherosclerotic heart disease of native coronary artery without angina pectoris: Secondary | ICD-10-CM

## 2012-07-10 MED ORDER — LISINOPRIL-HYDROCHLOROTHIAZIDE 20-25 MG PO TABS: 1.0000 | ORAL_TABLET | Freq: Every day | ORAL | Status: DC

## 2012-07-10 NOTE — Assessment & Plan Note (Signed)
Continue treatment with atorvastatin. 

## 2012-07-10 NOTE — Assessment & Plan Note (Signed)
Recent cardiac catheterization showed no evidence of obstructive disease. He was noted to have elevated blood pressure mildly elevated left ventricular end-diastolic pressure which might be contributing to his symptoms of dyspnea. Thus, I will add small dose hydrochlorothiazide 25 mg once daily. Otherwise, he is on anti-angina medications.

## 2012-07-10 NOTE — Progress Notes (Signed)
Primary Care physician: Dr. Dario Guardian HPI  This is a 62 year old male who is here today for a followup visit. He has known history of coronary artery disease, stent x2 to his circumflex, hypertension, hyperlipidemia, diabetes, obstructive sleep apnea, chronic pain, bipolar and schizophrenia. He was seen recently for abnormal stress test which showed possible ischemia in the inferolateral wall. He underwent cardiac catheterization which showed patent stent in the left circumflex with moderate three-vessel disease without evidence of obstructive CAD. His coronary anatomy was unchanged from most recent cardiac catheterization. He was noted to be hypertensive with mildly elevated left ventricular end-diastolic pressure. He has been doing reasonably well. He has stable symptoms of chest pain an average of twice a week which does not require nitroglycerin. It's usually brief. He also complains of exertional dyspnea.   Allergies  Allergen Reactions  . Morphine And Related      Current Outpatient Prescriptions on File Prior to Visit  Medication Sig Dispense Refill  . atorvastatin (LIPITOR) 80 MG tablet Take 80 mg by mouth daily.      . cloNIDine (CATAPRES) 0.2 MG tablet Take 0.2 mg by mouth 3 (three) times daily.       . clopidogrel (PLAVIX) 75 MG tablet Take 75 mg by mouth daily.      . ergocalciferol (VITAMIN D2) 50000 UNITS capsule Take 50,000 Units by mouth once a week.      . isosorbide mononitrate (IMDUR) 30 MG 24 hr tablet Take 1 tablet (30 mg total) by mouth 2 (two) times daily.  60 tablet  11  . LORazepam (ATIVAN) 1 MG tablet Take 1 mg by mouth 3 (three) times daily.       . metoprolol succinate (TOPROL-XL) 25 MG 24 hr tablet Take 25 mg by mouth daily.        . mirtazapine (REMERON) 30 MG tablet Take 30 mg by mouth at bedtime.      . nitroGLYCERIN (NITROSTAT) 0.4 MG SL tablet Place 0.4 mg under the tongue every 5 (five) minutes as needed.      Marland Kitchen oxcarbazepine (TRILEPTAL) 600 MG tablet  Take 600 mg by mouth daily.       . paliperidone (INVEGA) 6 MG 24 hr tablet Take 6 mg by mouth daily.      . pantoprazole (PROTONIX) 40 MG tablet Take 40 mg by mouth daily.      . ranolazine (RANEXA) 1000 MG SR tablet Take 1 tablet (1,000 mg total) by mouth 2 (two) times daily.  60 tablet  11  . Tamsulosin HCl (FLOMAX) 0.4 MG CAPS Take 0.4 mg by mouth daily.      Marland Kitchen topiramate (TOPAMAX) 100 MG tablet Take 100 mg by mouth daily.      . traMADol (ULTRAM) 50 MG tablet Take 50 mg by mouth 4 (four) times daily.        No current facility-administered medications on file prior to visit.     Past Medical History  Diagnosis Date  . Hypertension   . Diabetes mellitus     Type II  . Coronary artery disease   . Sleep apnea   . Esophageal reflux   . Disc degeneration, lumbar   . Bipolar disorder   . Hyperlipidemia   . MI (myocardial infarction) 2001;2012;2012  . Arthritis   . BPD (bronchopulmonary dysplasia)   . Panic attack   . Neuropathy   . Obsessive compulsive disorder   . Carpal tunnel syndrome   . History of bladder  infections   . Unspecified vitamin D deficiency   . Other dysphagia   . Iron deficiency anemia, unspecified      Past Surgical History  Procedure Laterality Date  . Tonsillectomy    . Back surgery    . Skin graft    . Carpal tunnel release  2009    left  . Cardiac catheterization  6/10;2011;Aug.2012;Oct. 2012;   . Coronary angioplasty  2011 & 2001    s/p stent  . Cardiac catheterization  05/2012    [facility]; EF 60%, patent stent in the left circumflex, moderate three-vessel disease with no flow-limiting lesions. Unchanged from most recent catheterization     Family History  Problem Relation Age of Onset  . Heart disease Mother   . Heart disease Brother   . Heart disease Brother      History   Social History  . Marital Status: Married    Spouse Name: N/A    Number of Children: N/A  . Years of Education: N/A   Occupational History  . Not on file.    Social History Main Topics  . Smoking status: Former Smoker -- 15 years    Types: Cigarettes    Quit date: 05/17/2000  . Smokeless tobacco: Not on file  . Alcohol Use: No  . Drug Use: No  . Sexually Active:    Other Topics Concern  . Not on file   Social History Narrative  . No narrative on file     PHYSICAL EXAM   BP 168/70  Pulse 62  Ht 6\' 3"  (1.905 m)  Wt 234 lb 4 oz (106.255 kg)  BMI 29.28 kg/m2  Constitutional: He is oriented to person, place, and time. He appears well-developed and well-nourished. No distress.  HENT: No nasal discharge.  Head: Normocephalic and atraumatic.  Eyes: Pupils are equal and round. Right eye exhibits no discharge. Left eye exhibits no discharge.  Neck: Normal range of motion. Neck supple. No JVD present. No thyromegaly present.  Cardiovascular: Normal rate, regular rhythm, normal heart sounds and. Exam reveals no gallop and no friction rub. No murmur heard.  Pulmonary/Chest: Effort normal and breath sounds normal. No stridor. No respiratory distress. He has no wheezes. He has no rales. He exhibits no tenderness.  Abdominal: Soft. Bowel sounds are normal. He exhibits no distension. There is no tenderness. There is no rebound and no guarding.  Musculoskeletal: Normal range of motion. He exhibits no edema and no tenderness.  Neurological: He is alert and oriented to person, place, and time. Coordination normal.  Skin: Skin is warm and dry. No rash noted. He is not diaphoretic. No erythema. No pallor.  Psychiatric: He has a normal mood and affect. His behavior is normal. Judgment and thought content normal.  Right radial pulse is normal with no hematoma    ZOX:WRUEA  Rhythm  -First degree A-V block  PRi = 220 -Nonspecific QRS widening and anterior fascicular block.   ABNORMAL     ASSESSMENT AND PLAN

## 2012-07-10 NOTE — Patient Instructions (Addendum)
Stop Lisinopril.  Start Lisinopril-Hydrochlorothiazide 20/25 mg once daily.  Check labs in 1 week.  Follow up in 3 months.

## 2012-07-10 NOTE — Assessment & Plan Note (Signed)
His blood pressure is mildly elevated. Hydrochlorothiazide will be added as outlined above. I think one consideration would be to try to wean him off clonidine slowly and add amlodipine which is a better antihypertensive and anti-angina medication.

## 2012-07-17 ENCOUNTER — Ambulatory Visit (INDEPENDENT_AMBULATORY_CARE_PROVIDER_SITE_OTHER): Payer: Medicare Other

## 2012-07-17 DIAGNOSIS — R0602 Shortness of breath: Secondary | ICD-10-CM

## 2012-07-19 NOTE — Progress Notes (Signed)
Pt informed of normal lab results

## 2012-07-27 ENCOUNTER — Ambulatory Visit: Payer: Self-pay | Admitting: Emergency Medicine

## 2012-10-11 ENCOUNTER — Ambulatory Visit: Payer: Medicare Other | Admitting: Cardiovascular Disease

## 2012-11-19 ENCOUNTER — Ambulatory Visit: Payer: Medicare Other | Admitting: Cardiovascular Disease

## 2012-11-20 ENCOUNTER — Ambulatory Visit: Payer: Medicare Other | Admitting: Cardiovascular Disease

## 2012-12-03 ENCOUNTER — Inpatient Hospital Stay: Payer: Self-pay | Admitting: Internal Medicine

## 2012-12-03 DIAGNOSIS — I517 Cardiomegaly: Secondary | ICD-10-CM

## 2012-12-03 DIAGNOSIS — R079 Chest pain, unspecified: Secondary | ICD-10-CM

## 2012-12-03 LAB — DRUG SCREEN, URINE
Amphetamines, Ur Screen: NEGATIVE (ref ?–1000)
Barbiturates, Ur Screen: NEGATIVE (ref ?–200)
Benzodiazepine, Ur Scrn: NEGATIVE (ref ?–200)
Cannabinoid 50 Ng, Ur ~~LOC~~: NEGATIVE (ref ?–50)
Cocaine Metabolite,Ur ~~LOC~~: NEGATIVE (ref ?–300)
Opiate, Ur Screen: NEGATIVE (ref ?–300)
Phencyclidine (PCP) Ur S: NEGATIVE (ref ?–25)
Tricyclic, Ur Screen: POSITIVE (ref ?–1000)

## 2012-12-03 LAB — ETHANOL: Ethanol %: <0.003 % (ref 0.000–0.080)

## 2012-12-03 LAB — HEPATIC FUNCTION PANEL A (ARMC)
Bilirubin, Direct: <0.1 mg/dL (ref 0.00–0.20)
Bilirubin,Total: 0.2 mg/dL (ref 0.2–1.0)
SGPT (ALT): 26 U/L (ref 12–78)
Total Protein: 6.3 g/dL — ABNORMAL LOW (ref 6.4–8.2)

## 2012-12-03 LAB — AMMONIA: Ammonia, Plasma: 42 mcmol/L — ABNORMAL HIGH (ref 11–32)

## 2012-12-03 LAB — BASIC METABOLIC PANEL
Anion Gap: 5 — ABNORMAL LOW (ref 7–16)
BUN: 17 mg/dL (ref 7–18)
Calcium, Total: 8.8 mg/dL (ref 8.5–10.1)
Chloride: 105 mmol/L (ref 98–107)
Co2: 28 mmol/L (ref 21–32)
EGFR (Non-African Amer.): >60
Glucose: 260 mg/dL — ABNORMAL HIGH (ref 65–99)
Osmolality: 286 (ref 275–301)
Sodium: 138 mmol/L (ref 136–145)

## 2012-12-03 LAB — TROPONIN I
Troponin-I: 0.12 ng/mL — ABNORMAL HIGH
Troponin-I: 0.12 ng/mL — ABNORMAL HIGH
Troponin-I: 0.11 ng/mL — ABNORMAL HIGH

## 2012-12-03 LAB — CBC
MCV: 91 fL (ref 80–100)
Platelet: 148 10*3/uL — ABNORMAL LOW (ref 150–440)
RBC: 4.12 10*6/uL — ABNORMAL LOW (ref 4.40–5.90)
WBC: 5.4 10*3/uL (ref 3.8–10.6)

## 2012-12-03 LAB — CK TOTAL AND CKMB (NOT AT ARMC)
CK, Total: 329 U/L — ABNORMAL HIGH (ref 35–232)
CK, Total: 180 U/L (ref 35–232)
CK-MB: 2.3 ng/mL (ref 0.5–3.6)

## 2012-12-03 LAB — HEMOGLOBIN A1C: Hemoglobin A1C: 7.2 % — ABNORMAL HIGH (ref 4.2–6.3)

## 2012-12-04 LAB — CBC WITH DIFFERENTIAL/PLATELET
Basophil #: 0 10*3/uL (ref 0.0–0.1)
Basophil %: 0.7 %
Eosinophil #: 0.1 10*3/uL (ref 0.0–0.7)
Eosinophil %: 2.4 %
HCT: 35.5 % — ABNORMAL LOW (ref 40.0–52.0)
HGB: 12.3 g/dL — ABNORMAL LOW (ref 13.0–18.0)
Lymphocyte #: 1.7 10*3/uL (ref 1.0–3.6)
Lymphocyte %: 27.1 %
MCH: 31.5 pg (ref 26.0–34.0)
MCHC: 34.6 g/dL (ref 32.0–36.0)
MCV: 91 fL (ref 80–100)
Monocyte %: 8.1 %
Neutrophil #: 3.8 10*3/uL (ref 1.4–6.5)
RBC: 3.9 10*6/uL — ABNORMAL LOW (ref 4.40–5.90)
WBC: 6.2 10*3/uL (ref 3.8–10.6)

## 2012-12-06 ENCOUNTER — Ambulatory Visit: Payer: Medicare Other | Admitting: Cardiovascular Disease

## 2012-12-07 ENCOUNTER — Telehealth: Payer: Self-pay

## 2012-12-07 NOTE — Telephone Encounter (Signed)
Dr Curly Sh[place] office, psychiatrist ofc, has question about pt medications. Please call.

## 2012-12-10 ENCOUNTER — Telehealth: Payer: Self-pay

## 2012-12-10 NOTE — Telephone Encounter (Signed)
Spoke w/ Burna Mortimer at Dr. Kathreen Cornfield office.   She puts pt's medication box together for him and is questioning the change of lisinopril 10 mg to lisinopril hctz. Informed her that pt has never been seen in our office, but she said that Dr. Kirke Corin consulted on this pt in [facility]. Called her back and left a message stating that [facility] records state that pt is on Lisinopril 10 mg, but clarified that pt has never been seen in our office, so I am unable to verify the med list and for her to call with any questions or concerns.

## 2012-12-17 ENCOUNTER — Ambulatory Visit: Payer: Medicare Other | Admitting: Cardiovascular Disease

## 2012-12-26 ENCOUNTER — Emergency Department: Payer: Self-pay | Admitting: Emergency Medicine

## 2012-12-26 LAB — COMPREHENSIVE METABOLIC PANEL
Albumin: 3.6 g/dL (ref 3.4–5.0)
Alkaline Phosphatase: 71 U/L (ref 50–136)
Anion Gap: 7 (ref 7–16)
BUN: 10 mg/dL (ref 7–18)
Bilirubin,Total: 0.2 mg/dL (ref 0.2–1.0)
Calcium, Total: 8.5 mg/dL (ref 8.5–10.1)
Chloride: 106 mmol/L (ref 98–107)
Co2: 26 mmol/L (ref 21–32)
Creatinine: 1.1 mg/dL (ref 0.60–1.30)
EGFR (African American): >60
EGFR (Non-African Amer.): >60
Glucose: 153 mg/dL — ABNORMAL HIGH (ref 65–99)
Osmolality: 280 (ref 275–301)
SGOT(AST): 23 U/L (ref 15–37)
SGPT (ALT): 22 U/L (ref 12–78)
Sodium: 139 mmol/L (ref 136–145)
Total Protein: 6.5 g/dL (ref 6.4–8.2)

## 2012-12-26 LAB — CBC WITH DIFFERENTIAL/PLATELET
Basophil #: 0.1 10*3/uL (ref 0.0–0.1)
Basophil %: 1 %
HCT: 38.6 % — ABNORMAL LOW (ref 40.0–52.0)
Lymphocyte #: 1.8 10*3/uL (ref 1.0–3.6)
MCV: 90 fL (ref 80–100)
Monocyte #: 0.4 x10 3/mm (ref 0.2–1.0)
Monocyte %: 7 %
Neutrophil %: 61.1 %
Platelet: 204 10*3/uL (ref 150–440)
RBC: 4.29 10*6/uL — ABNORMAL LOW (ref 4.40–5.90)
RDW: 13.3 % (ref 11.5–14.5)
WBC: 6.4 10*3/uL (ref 3.8–10.6)

## 2012-12-27 ENCOUNTER — Encounter: Payer: Self-pay | Admitting: Cardiovascular Disease

## 2012-12-27 ENCOUNTER — Other Ambulatory Visit: Payer: Self-pay | Admitting: Cardiovascular Disease

## 2012-12-27 ENCOUNTER — Ambulatory Visit (INDEPENDENT_AMBULATORY_CARE_PROVIDER_SITE_OTHER): Payer: Medicare Other | Admitting: Cardiovascular Disease

## 2012-12-27 VITALS — BP 148/100 | HR 67 | Ht 75.0 in | Wt 247.2 lb

## 2012-12-27 DIAGNOSIS — E785 Hyperlipidemia, unspecified: Secondary | ICD-10-CM

## 2012-12-27 DIAGNOSIS — R079 Chest pain, unspecified: Secondary | ICD-10-CM

## 2012-12-27 DIAGNOSIS — I10 Essential (primary) hypertension: Secondary | ICD-10-CM

## 2012-12-27 DIAGNOSIS — I2510 Atherosclerotic heart disease of native coronary artery without angina pectoris: Secondary | ICD-10-CM

## 2012-12-27 MED ORDER — CARVEDILOL 6.25 MG PO TABS: 6.2500 mg | ORAL_TABLET | Freq: Two times a day (BID) | ORAL | Status: DC

## 2012-12-27 NOTE — Telephone Encounter (Signed)
Refilled Carvedilol sent to [facility] pharmacy.

## 2012-12-27 NOTE — Assessment & Plan Note (Signed)
Continue treatment with atorvastatin. 

## 2012-12-27 NOTE — Patient Instructions (Addendum)
Stop Metoprolol (Torol) Start Carvedilol 6.26 mg twice daily.   Follow up in 2 weeks.

## 2012-12-27 NOTE — Assessment & Plan Note (Signed)
His angina pattern has not changed. I think we have to control his blood pressure better. Continue medical therapy. Cardiac catheterization in March of this year showed moderate three-vessel coronary disease.

## 2012-12-27 NOTE — Progress Notes (Signed)
Primary Care physician: Dr. Dario Guardian  HPI  This is a 62 year old male who is here today for a followup visit. He has known history of coronary artery disease, stent x2 to his circumflex, hypertension, hyperlipidemia, diabetes, obstructive sleep apnea, chronic pain, bipolar and schizophrenia. He had an abnormal stress test in March, 2014 which showed possible ischemia in the inferolateral wall. He underwent cardiac catheterization which showed patent stent in the left circumflex with moderate three-vessel disease without evidence of obstructive CAD. His coronary anatomy was unchanged from most recent cardiac catheterization. He was noted to be hypertensive with mildly elevated left ventricular end-diastolic pressure. He has chronic stable angina which has been reasonably controlled with medications. He was hospitalized recently at [facility] with atypical chest pain which seemed to be musculoskeletal. He seems to have borderline chronic elevation of troponin. He also had abdominal distention of unclear etiology that resolved without intervention. Abdominal ultrasound showed no evidence of ascites but there was fatty liver. The patient had previous history of alcohol abuse but not in the recent years. He woke up this morning with a headache. He called EMS and was noted to have high blood pressure of 200/109. He was taken to the emergency room at [facility]. Troponin was again slightly elevated. He denies chest pain or dyspnea.   Allergies  Allergen Reactions  . Morphine And Related      Current Outpatient Prescriptions on File Prior to Visit  Medication Sig Dispense Refill  . atorvastatin (LIPITOR) 80 MG tablet Take 80 mg by mouth daily.      Marland Kitchen buPROPion (WELLBUTRIN XL) 300 MG 24 hr tablet Take 300 mg by mouth daily.       . clopidogrel (PLAVIX) 75 MG tablet Take 75 mg by mouth daily.      Marland Kitchen gabapentin (NEURONTIN) 300 MG capsule Take 300 mg by mouth 3 (three) times daily.       . isosorbide mononitrate  (IMDUR) 30 MG 24 hr tablet Take 1 tablet (30 mg total) by mouth 2 (two) times daily.  60 tablet  11  . lisinopril-hydrochlorothiazide (PRINZIDE,ZESTORETIC) 20-25 MG per tablet Take 1 tablet by mouth daily.  30 tablet  6  . LORazepam (ATIVAN) 1 MG tablet Take 1 mg by mouth 3 (three) times daily.       . metoprolol succinate (TOPROL-XL) 25 MG 24 hr tablet Take 25 mg by mouth daily.        . nitroGLYCERIN (NITROSTAT) 0.4 MG SL tablet Place 0.4 mg under the tongue every 5 (five) minutes as needed.      . pantoprazole (PROTONIX) 40 MG tablet Take 40 mg by mouth daily.      . QUEtiapine (SEROQUEL) 200 MG tablet Take 200 mg by mouth at bedtime.      . ranolazine (RANEXA) 1000 MG SR tablet Take 1 tablet (1,000 mg total) by mouth 2 (two) times daily.  60 tablet  11  . Tamsulosin HCl (FLOMAX) 0.4 MG CAPS Take 0.4 mg by mouth daily.      Marland Kitchen topiramate (TOPAMAX) 100 MG tablet Take 100 mg by mouth daily.      . traMADol (ULTRAM) 50 MG tablet Take 50 mg by mouth 4 (four) times daily.        No current facility-administered medications on file prior to visit.     Past Medical History  Diagnosis Date  . Diabetes mellitus     Type II  . Coronary artery disease   . Sleep apnea   .  Esophageal reflux   . Disc degeneration, lumbar   . Bipolar disorder   . Hyperlipidemia   . MI (myocardial infarction) 2001;2012;2012  . Arthritis   . BPD (bronchopulmonary dysplasia)   . Panic attack   . Neuropathy   . Obsessive compulsive disorder   . Carpal tunnel syndrome   . History of bladder infections   . Unspecified vitamin D deficiency   . Other dysphagia   . Iron deficiency anemia, unspecified   . Hypertension      Past Surgical History  Procedure Laterality Date  . Tonsillectomy    . Back surgery    . Skin graft    . Carpal tunnel release  2009    left  . Cardiac catheterization  6/10;2011;Aug.2012;Oct. 2012;   . Coronary angioplasty  2011 & 2001    s/p stent  . Cardiac catheterization  05/2012     [facility]; EF 60%, patent stent in the left circumflex, moderate three-vessel disease with no flow-limiting lesions. Unchanged from most recent catheterization     Family History  Problem Relation Age of Onset  . Heart disease Mother   . Heart disease Brother   . Heart disease Brother      History   Social History  . Marital Status: Married    Spouse Name: N/A    Number of Children: N/A  . Years of Education: N/A   Occupational History  . Not on file.   Social History Main Topics  . Smoking status: Former Smoker -- 15 years    Types: Cigarettes    Quit date: 05/17/2000  . Smokeless tobacco: Not on file  . Alcohol Use: No  . Drug Use: No  . Sexual Activity:    Other Topics Concern  . Not on file   Social History Narrative  . No narrative on file     PHYSICAL EXAM   BP 148/100  Pulse 67  Ht 6\' 3"  (1.905 m)  Wt 247 lb 4 oz (112.152 kg)  BMI 30.9 kg/m2  Constitutional: He is oriented to person, place, and time. He appears well-developed and well-nourished. No distress.  HENT: No nasal discharge.  Head: Normocephalic and atraumatic.  Eyes: Pupils are equal and round. Right eye exhibits no discharge. Left eye exhibits no discharge.  Neck: Normal range of motion. Neck supple. No JVD present. No thyromegaly present.  Cardiovascular: Normal rate, regular rhythm, normal heart sounds and. Exam reveals no gallop and no friction rub. No murmur heard.  Pulmonary/Chest: Effort normal and breath sounds normal. No stridor. No respiratory distress. He has no wheezes. He has no rales. He exhibits no tenderness.  Abdominal: Soft. Bowel sounds are normal. He exhibits no distension. There is no tenderness. There is no rebound and no guarding.  Musculoskeletal: Normal range of motion. He exhibits no edema and no tenderness.  Neurological: He is alert and oriented to person, place, and time. Coordination normal.  Skin: Skin is warm and dry. No rash noted. He is not diaphoretic. No  erythema. No pallor.  Psychiatric: He has a normal mood and affect. His behavior is normal. Judgment and thought content normal.  Right radial pulse is normal with no hematoma    EKG: Sinus  Rhythm  -First degree A-V block  PRi = 220 -Left axis -anterior fascicular block.   Voltage criteria for LVH  (R(aVL) exceeds 1.26 mV)  -Nonspecific QRS widening.   ABNORMAL     ASSESSMENT AND PLAN

## 2012-12-27 NOTE — Assessment & Plan Note (Signed)
I will stop metoprolol and switched to carvedilol 6.25 mg once daily. I will bring him back in 2 weeks to see if we need to increase this medication.

## 2012-12-31 ENCOUNTER — Emergency Department: Payer: Self-pay | Admitting: Emergency Medicine

## 2012-12-31 LAB — URINALYSIS, COMPLETE
Bacteria: NONE SEEN
Bilirubin,UR: NEGATIVE
Blood: NEGATIVE
Glucose,UR: NEGATIVE mg/dL (ref 0–75)
Nitrite: NEGATIVE
Ph: 6 (ref 4.5–8.0)
Protein: NEGATIVE
Specific Gravity: 1.009 (ref 1.003–1.030)
Squamous Epithelial: <1

## 2012-12-31 LAB — CBC
HCT: 41.5 % (ref 40.0–52.0)
HGB: 14.3 g/dL (ref 13.0–18.0)
MCHC: 34.4 g/dL (ref 32.0–36.0)
Platelet: 209 10*3/uL (ref 150–440)
RDW: 13.2 % (ref 11.5–14.5)
WBC: 7.3 10*3/uL (ref 3.8–10.6)

## 2012-12-31 LAB — COMPREHENSIVE METABOLIC PANEL
Alkaline Phosphatase: 68 U/L (ref 50–136)
Anion Gap: 5 — ABNORMAL LOW (ref 7–16)
BUN: 11 mg/dL (ref 7–18)
Bilirubin,Total: 0.5 mg/dL (ref 0.2–1.0)
Co2: 27 mmol/L (ref 21–32)
EGFR (African American): >60
EGFR (Non-African Amer.): >60
Glucose: 90 mg/dL (ref 65–99)
Potassium: 3.7 mmol/L (ref 3.5–5.1)
SGOT(AST): 23 U/L (ref 15–37)
SGPT (ALT): 25 U/L (ref 12–78)
Sodium: 138 mmol/L (ref 136–145)

## 2012-12-31 LAB — TROPONIN I: Troponin-I: 0.2 ng/mL — ABNORMAL HIGH

## 2013-01-01 ENCOUNTER — Encounter (HOSPITAL_COMMUNITY): Payer: Self-pay

## 2013-01-01 ENCOUNTER — Inpatient Hospital Stay (HOSPITAL_COMMUNITY)
Admission: AD | Admit: 2013-01-01 | Discharge: 2013-01-01 | DRG: 305 | Disposition: A | Discharge status: Home / Self Care | Payer: Medicare Other | Source: Other Acute Inpatient Hospital | Attending: Internal Medicine | Admitting: Internal Medicine

## 2013-01-01 DIAGNOSIS — G4730 Sleep apnea, unspecified: Secondary | ICD-10-CM | POA: Diagnosis present

## 2013-01-01 DIAGNOSIS — I152 Hypertension secondary to endocrine disorders: Secondary | ICD-10-CM | POA: Diagnosis present

## 2013-01-01 DIAGNOSIS — K219 Gastro-esophageal reflux disease without esophagitis: Secondary | ICD-10-CM | POA: Diagnosis present

## 2013-01-01 DIAGNOSIS — M51379 Other intervertebral disc degeneration, lumbosacral region without mention of lumbar back pain or lower extremity pain: Secondary | ICD-10-CM | POA: Diagnosis present

## 2013-01-01 DIAGNOSIS — I160 Hypertensive urgency: Secondary | ICD-10-CM | POA: Diagnosis present

## 2013-01-01 DIAGNOSIS — R079 Chest pain, unspecified: Secondary | ICD-10-CM | POA: Diagnosis present

## 2013-01-01 DIAGNOSIS — I252 Old myocardial infarction: Secondary | ICD-10-CM

## 2013-01-01 DIAGNOSIS — F429 Obsessive-compulsive disorder, unspecified: Secondary | ICD-10-CM | POA: Diagnosis present

## 2013-01-01 DIAGNOSIS — Z87440 Personal history of urinary (tract) infections: Secondary | ICD-10-CM

## 2013-01-01 DIAGNOSIS — Z9861 Coronary angioplasty status: Secondary | ICD-10-CM

## 2013-01-01 DIAGNOSIS — Z7902 Long term (current) use of antithrombotics/antiplatelets: Secondary | ICD-10-CM

## 2013-01-01 DIAGNOSIS — F410 Panic disorder [episodic paroxysmal anxiety] without agoraphobia: Secondary | ICD-10-CM | POA: Diagnosis present

## 2013-01-01 DIAGNOSIS — Z87891 Personal history of nicotine dependence: Secondary | ICD-10-CM

## 2013-01-01 DIAGNOSIS — E559 Vitamin D deficiency, unspecified: Secondary | ICD-10-CM | POA: Diagnosis present

## 2013-01-01 DIAGNOSIS — D509 Iron deficiency anemia, unspecified: Secondary | ICD-10-CM | POA: Diagnosis present

## 2013-01-01 DIAGNOSIS — E785 Hyperlipidemia, unspecified: Secondary | ICD-10-CM | POA: Diagnosis present

## 2013-01-01 DIAGNOSIS — E119 Type 2 diabetes mellitus without complications: Secondary | ICD-10-CM | POA: Diagnosis present

## 2013-01-01 DIAGNOSIS — Z79899 Other long term (current) drug therapy: Secondary | ICD-10-CM

## 2013-01-01 DIAGNOSIS — M129 Arthropathy, unspecified: Secondary | ICD-10-CM | POA: Diagnosis present

## 2013-01-01 DIAGNOSIS — F319 Bipolar disorder, unspecified: Secondary | ICD-10-CM | POA: Diagnosis present

## 2013-01-01 DIAGNOSIS — I10 Essential (primary) hypertension: Principal | ICD-10-CM | POA: Diagnosis present

## 2013-01-01 DIAGNOSIS — M5137 Other intervertebral disc degeneration, lumbosacral region: Secondary | ICD-10-CM | POA: Diagnosis present

## 2013-01-01 DIAGNOSIS — Z8249 Family history of ischemic heart disease and other diseases of the circulatory system: Secondary | ICD-10-CM

## 2013-01-01 DIAGNOSIS — I2510 Atherosclerotic heart disease of native coronary artery without angina pectoris: Secondary | ICD-10-CM

## 2013-01-01 DIAGNOSIS — M47817 Spondylosis without myelopathy or radiculopathy, lumbosacral region: Secondary | ICD-10-CM | POA: Diagnosis present

## 2013-01-01 LAB — CBC
HCT: 37.6 % — ABNORMAL LOW (ref 39.0–52.0)
Hemoglobin: 13 g/dL (ref 13.0–17.0)
MCHC: 34.6 g/dL (ref 30.0–36.0)
MCV: 89.1 fL (ref 78.0–100.0)
RBC: 4.22 MIL/uL (ref 4.22–5.81)
RDW: 12.8 % (ref 11.5–15.5)
WBC: 8 10*3/uL (ref 4.0–10.5)

## 2013-01-01 LAB — HEMOGLOBIN A1C: Mean Plasma Glucose: 148 mg/dL — ABNORMAL HIGH (ref <117)

## 2013-01-01 LAB — GLUCOSE, CAPILLARY
Glucose-Capillary: 136 mg/dL — ABNORMAL HIGH (ref 70–99)
Glucose-Capillary: 128 mg/dL — ABNORMAL HIGH (ref 70–99)

## 2013-01-01 LAB — COMPREHENSIVE METABOLIC PANEL
AST: 16 U/L (ref 0–37)
Albumin: 3.7 g/dL (ref 3.5–5.2)
Alkaline Phosphatase: 53 U/L (ref 39–117)
BUN: 15 mg/dL (ref 6–23)
CO2: 24 mEq/L (ref 19–32)
Chloride: 103 mEq/L (ref 96–112)
Creatinine, Ser: 0.86 mg/dL (ref 0.50–1.35)
Potassium: 4.2 mEq/L (ref 3.5–5.1)
Total Bilirubin: 0.4 mg/dL (ref 0.3–1.2)
Total Protein: 6.2 g/dL (ref 6.0–8.3)

## 2013-01-01 LAB — MAGNESIUM: Magnesium: 1.8 mg/dL (ref 1.5–2.5)

## 2013-01-01 LAB — LIPASE, BLOOD: Lipase: 17 U/L (ref 11–59)

## 2013-01-01 LAB — TSH: TSH: 1.336 u[IU]/mL (ref 0.350–4.500)

## 2013-01-01 LAB — TROPONIN I
Troponin I: <0.3 ng/mL (ref <0.30)
Troponin I: <0.3 ng/mL (ref <0.30)

## 2013-01-01 MED ORDER — TAMSULOSIN HCL 0.4 MG PO CAPS
0.4000 mg | ORAL_CAPSULE | Freq: Every day | ORAL | Status: DC
  Administered 2013-01-01: 0.4 mg via ORAL
  Filled 2013-01-01: qty 1

## 2013-01-01 MED ORDER — PALIPERIDONE ER 6 MG PO TB24
6.0000 mg | ORAL_TABLET | Freq: Every day | ORAL | Status: DC
  Administered 2013-01-01: 6 mg via ORAL
  Filled 2013-01-01: qty 1

## 2013-01-01 MED ORDER — TRAMADOL HCL 50 MG PO TABS
50.0000 mg | ORAL_TABLET | Freq: Three times a day (TID) | ORAL | Status: DC
  Administered 2013-01-01 (×2): 50 mg via ORAL
  Filled 2013-01-01 (×2): qty 1

## 2013-01-01 MED ORDER — ISOSORBIDE MONONITRATE ER 60 MG PO TB24
60.0000 mg | ORAL_TABLET | Freq: Every day | ORAL | Status: DC
  Administered 2013-01-01: 60 mg via ORAL
  Filled 2013-01-01: qty 1

## 2013-01-01 MED ORDER — QUETIAPINE FUMARATE 400 MG PO TABS
400.0000 mg | ORAL_TABLET | Freq: Every day | ORAL | Status: DC
  Filled 2013-01-01: qty 1

## 2013-01-01 MED ORDER — METOPROLOL SUCCINATE ER 25 MG PO TB24
25.0000 mg | ORAL_TABLET | Freq: Every day | ORAL | Status: DC
  Administered 2013-01-01: 25 mg via ORAL
  Filled 2013-01-01: qty 1

## 2013-01-01 MED ORDER — ONDANSETRON HCL 4 MG/2ML IJ SOLN
4.0000 mg | Freq: Four times a day (QID) | INTRAMUSCULAR | Status: DC | PRN
  Administered 2013-01-01: 4 mg via INTRAVENOUS
  Filled 2013-01-01: qty 2

## 2013-01-01 MED ORDER — SODIUM CHLORIDE 0.9 % IV SOLN: INTRAVENOUS | Status: DC

## 2013-01-01 MED ORDER — ATORVASTATIN CALCIUM 80 MG PO TABS
80.0000 mg | ORAL_TABLET | Freq: Every day | ORAL | Status: DC
  Administered 2013-01-01: 80 mg via ORAL
  Filled 2013-01-01: qty 1

## 2013-01-01 MED ORDER — AMLODIPINE BESYLATE 5 MG PO TABS
5.0000 mg | ORAL_TABLET | Freq: Every day | ORAL | Status: DC
  Administered 2013-01-01: 5 mg via ORAL
  Filled 2013-01-01: qty 1

## 2013-01-01 MED ORDER — RANOLAZINE ER 500 MG PO TB12
1000.0000 mg | ORAL_TABLET | Freq: Two times a day (BID) | ORAL | Status: DC
  Administered 2013-01-01: 1000 mg via ORAL
  Filled 2013-01-01 (×2): qty 2

## 2013-01-01 MED ORDER — HYDROCODONE-ACETAMINOPHEN 5-325 MG PO TABS
1.0000 | ORAL_TABLET | ORAL | Status: DC | PRN
  Administered 2013-01-01: 2 via ORAL
  Administered 2013-01-01: 1 via ORAL
  Filled 2013-01-01: qty 1
  Filled 2013-01-01: qty 2

## 2013-01-01 MED ORDER — HYDROMORPHONE HCL PF 1 MG/ML IJ SOLN: 0.5000 mg | INTRAMUSCULAR | Status: DC | PRN

## 2013-01-01 MED ORDER — ACETAMINOPHEN 650 MG RE SUPP: 650.0000 mg | Freq: Four times a day (QID) | RECTAL | Status: DC | PRN

## 2013-01-01 MED ORDER — INSULIN ASPART 100 UNIT/ML ~~LOC~~ SOLN
0.0000 [IU] | Freq: Three times a day (TID) | SUBCUTANEOUS | Status: DC
  Administered 2013-01-01: 1 [IU] via SUBCUTANEOUS

## 2013-01-01 MED ORDER — LORAZEPAM 1 MG PO TABS
1.0000 mg | ORAL_TABLET | Freq: Three times a day (TID) | ORAL | Status: DC
  Administered 2013-01-01: 1 mg via ORAL
  Filled 2013-01-01: qty 1

## 2013-01-01 MED ORDER — DOCUSATE SODIUM 100 MG PO CAPS
100.0000 mg | ORAL_CAPSULE | Freq: Two times a day (BID) | ORAL | Status: DC
  Administered 2013-01-01: 100 mg via ORAL
  Filled 2013-01-01 (×2): qty 1

## 2013-01-01 MED ORDER — BUPROPION HCL ER (XL) 300 MG PO TB24
300.0000 mg | ORAL_TABLET | Freq: Every day | ORAL | Status: DC
  Administered 2013-01-01: 300 mg via ORAL
  Filled 2013-01-01: qty 1

## 2013-01-01 MED ORDER — SODIUM CHLORIDE 0.9 % IJ SOLN: 3.0000 mL | Freq: Two times a day (BID) | INTRAMUSCULAR | Status: DC

## 2013-01-01 MED ORDER — ONDANSETRON HCL 4 MG PO TABS
4.0000 mg | ORAL_TABLET | Freq: Four times a day (QID) | ORAL | Status: DC | PRN
  Administered 2013-01-01: 4 mg via ORAL
  Filled 2013-01-01: qty 1

## 2013-01-01 MED ORDER — NITROGLYCERIN IN D5W 200-5 MCG/ML-% IV SOLN: 2.0000 ug/min | INTRAVENOUS | Status: DC

## 2013-01-01 MED ORDER — OXCARBAZEPINE 300 MG PO TABS
600.0000 mg | ORAL_TABLET | Freq: Every day | ORAL | Status: DC
  Filled 2013-01-01: qty 2

## 2013-01-01 MED ORDER — CLOPIDOGREL BISULFATE 75 MG PO TABS
75.0000 mg | ORAL_TABLET | Freq: Every day | ORAL | Status: DC
  Administered 2013-01-01: 75 mg via ORAL
  Filled 2013-01-01: qty 1

## 2013-01-01 MED ORDER — MIRTAZAPINE 30 MG PO TABS
30.0000 mg | ORAL_TABLET | Freq: Every day | ORAL | Status: DC
  Filled 2013-01-01: qty 1

## 2013-01-01 MED ORDER — ASPIRIN EC 81 MG PO TBEC
81.0000 mg | DELAYED_RELEASE_TABLET | Freq: Every day | ORAL | Status: DC
  Administered 2013-01-01: 81 mg via ORAL
  Filled 2013-01-01: qty 1

## 2013-01-01 MED ORDER — INSULIN ASPART 100 UNIT/ML ~~LOC~~ SOLN: 0.0000 [IU] | Freq: Every day | SUBCUTANEOUS | Status: DC

## 2013-01-01 MED ORDER — PANTOPRAZOLE SODIUM 40 MG PO TBEC
40.0000 mg | DELAYED_RELEASE_TABLET | Freq: Every day | ORAL | Status: DC
  Administered 2013-01-01: 40 mg via ORAL
  Filled 2013-01-01: qty 1

## 2013-01-01 MED ORDER — ACETAMINOPHEN 325 MG PO TABS: 650.0000 mg | ORAL_TABLET | Freq: Four times a day (QID) | ORAL | Status: DC | PRN

## 2013-01-01 MED ORDER — ISOSORBIDE MONONITRATE ER 30 MG PO TB24: 30.0000 mg | ORAL_TABLET | Freq: Two times a day (BID) | ORAL | Status: DC

## 2013-01-01 MED ORDER — GABAPENTIN 300 MG PO CAPS
300.0000 mg | ORAL_CAPSULE | Freq: Three times a day (TID) | ORAL | Status: DC
  Administered 2013-01-01: 300 mg via ORAL
  Filled 2013-01-01 (×3): qty 1

## 2013-01-01 MED ORDER — TRAZODONE HCL 50 MG PO TABS
50.0000 mg | ORAL_TABLET | Freq: Every day | ORAL | Status: DC
  Filled 2013-01-01: qty 1

## 2013-01-01 MED ORDER — ENOXAPARIN SODIUM 40 MG/0.4ML ~~LOC~~ SOLN
40.0000 mg | Freq: Every day | SUBCUTANEOUS | Status: DC
  Administered 2013-01-01: 40 mg via SUBCUTANEOUS
  Filled 2013-01-01: qty 0.4

## 2013-01-01 MED ORDER — TOPIRAMATE 100 MG PO TABS
200.0000 mg | ORAL_TABLET | Freq: Two times a day (BID) | ORAL | Status: DC
  Administered 2013-01-01: 200 mg via ORAL
  Filled 2013-01-01 (×2): qty 2

## 2013-01-01 NOTE — Progress Notes (Signed)
Discharge instructions given to patient. Patient understood information and repeated. Patient's wife will be picking him up. No change in V/S, no distress noted.

## 2013-01-01 NOTE — Discharge Summary (Signed)
Physician Discharge Summary  Roger Pruitt QIO:[id number] DOB: 31-Jul-1950 DOA: 01/01/2013  PCP: Sherrie Mustache, MD  Admit date: 01/01/2013 Discharge date: 01/01/2013  Time spent: 40 minutes  Recommendations for Outpatient Follow-up:  1. Patient is being discharged to home, however he will followup with his cardiologist this week and primary care doctor next week  Discharge Diagnoses:  Active Problems:   Chest pain   CAD (coronary artery disease)   Diabetes mellitus   Hypertension   Hypertensive urgency   Discharge Condition: Improved, being discharged home  Diet recommendation: Carb modified heart healthy  Filed Weights   01/01/13 0200 01/01/13 0800  Weight: 109.9 kg (242 lb 4.6 oz) 109.9 kg (242 lb 4.6 oz)    History of present illness:  Earlier this morning on 10/7: Patient is a 62 year old white male past medical history of bipolar disorder CAD, diabetes mellitus who has been seeing a psychiatrist the day before and was noted to have markedly high blood pressure. Patient states he's been compliant with his medication, and has been occasionally hospitalized for elevated blood pressures. Patient was sent over to the [facility] emergency room where it was felt he needed to be admitted. He was transferred over to [facility]: Because of lack of beds at [facility]. In the emergency room there, he was noted to have a blood pressure of 220/120 and he was given a dose of IV metoprolol with little improvement. Patient was started on nitroglycerin drip. He was transferred to step down unit here.  Hospital Course:  Active Problems:   Chest pain: Patient told a psychiatrist he has intermittent chest pain which is not new for him. EKG and troponins were stable during his hospitalization. He had no further chest pain after getting to the [facility] ER    CAD (coronary artery disease): Stable.    Diabetes mellitus: Patient recently diagnosed and started on metformin. Stable. CBG stable during his  hospitalization   Hypertension   Hypertensive urgency: Patient's blood pressure continued to improve nitroglycerin drip. He was given his morning medications and then nitroglycerin drip was discontinued. The patient's blood pressure stayed stable in the 140 systolic. At this point, patient in no other complaints and was felt he could be discharged home with follow up with his cardiologist/PCP to manage his blood pressure in the next week or 2.   Procedures:  None  Consultations:  None  Discharge Exam: Filed Vitals:   01/01/13 1500  BP: 165/90  Pulse:   Temp:   Resp: 20    General: Alert and oriented x3, no acute distress Cardiovascular: Regular rate and rhythm, S1-S2 Respiratory: Clear to auscultation bilaterally Abdomen: Soft, nontender, nondistended, positive bowel sounds  Discharge Instructions  Discharge Orders   Future Appointments Provider Department Dept Phone   01/10/2013 10:45 AM Iran Ouch, MD [facility] [phone]   Future Orders Complete By Expires   Diet - low sodium heart healthy  As directed    Increase activity slowly  As directed        Medication List    STOP taking these medications       lisinopril-hydrochlorothiazide 20-25 MG per tablet  Commonly known as:  PRINZIDE,ZESTORETIC      TAKE these medications       amLODipine 5 MG tablet  Commonly known as:  NORVASC  Take 5 mg by mouth daily.     atorvastatin 80 MG tablet  Commonly known as:  LIPITOR  Take 80 mg by mouth at bedtime.  buPROPion 300 MG 24 hr tablet  Commonly known as:  WELLBUTRIN XL  Take 300 mg by mouth daily.     clopidogrel 75 MG tablet  Commonly known as:  PLAVIX  Take 75 mg by mouth daily.     furosemide 40 MG tablet  Commonly known as:  LASIX  Take 40 mg by mouth daily.     isosorbide mononitrate 60 MG 24 hr tablet  Commonly known as:  IMDUR  Take 60 mg by mouth daily.     lisinopril 10 MG tablet  Commonly known as:   PRINIVIL,ZESTRIL  Take 10 mg by mouth daily.     LORazepam 1 MG tablet  Commonly known as:  ATIVAN  Take 1 mg by mouth 3 (three) times daily.     metoprolol succinate 50 MG 24 hr tablet  Commonly known as:  TOPROL-XL  Take 25 mg by mouth daily. Take with or immediately following a meal.     mirtazapine 30 MG tablet  Commonly known as:  REMERON  Take 30 mg by mouth at bedtime.     nitroGLYCERIN 0.4 MG SL tablet  Commonly known as:  NITROSTAT  Place 0.4 mg under the tongue every 5 (five) minutes as needed.     oxcarbazepine 600 MG tablet  Commonly known as:  TRILEPTAL  Take 600 mg by mouth at bedtime.     paliperidone 6 MG 24 hr tablet  Commonly known as:  INVEGA  Take 6 mg by mouth every morning.     pantoprazole 40 MG tablet  Commonly known as:  PROTONIX  Take 40 mg by mouth daily.     QUEtiapine 400 MG 24 hr tablet  Commonly known as:  SEROQUEL XR  Take 400 mg by mouth at bedtime.     ranolazine 1000 MG SR tablet  Commonly known as:  RANEXA  Take 1 tablet (1,000 mg total) by mouth 2 (two) times daily.     tamsulosin 0.4 MG Caps capsule  Commonly known as:  FLOMAX  Take 0.4 mg by mouth daily.     topiramate 100 MG tablet  Commonly known as:  TOPAMAX  Take 200 mg by mouth 2 (two) times daily.     traMADol 50 MG tablet  Commonly known as:  ULTRAM  Take 50 mg by mouth every 6 (six) hours as needed for pain.     traZODone 50 MG tablet  Commonly known as:  DESYREL  Take 50 mg by mouth at bedtime.       Allergies  Allergen Reactions  . Morphine And Related   . Synalgos-Dc [Aspirin-Caff-Dihydrocodeine] Itching    "Felt like 1000's bee's were stinging me"       Follow-up Information   Follow up with [facility], MD In 1 week.   Specialty:  Internal Medicine   Contact information:   [address] Marya Fossa   [place] [address] [phone]       Follow up with Lorine Bears, MD In 2 weeks.   Specialty:  Cardiology   Contact information:   [facility] [address] [place] [address] [phone]        The results of significant diagnostics from this hospitalization (including imaging, microbiology, ancillary and laboratory) are listed below for reference.    Significant Diagnostic Studies: No results found.  Microbiology: Recent Results (from the past 240 hour(s))  MRSA PCR SCREENING     Status: None   Collection Time    01/01/13  3:14 AM      Result Value Range Status   MRSA by PCR NEGATIVE  NEGATIVE Final   Comment:            The GeneXpert MRSA Assay (FDA     approved for NASAL specimens     only), is one component of a     comprehensive MRSA colonization     surveillance program. It is not     intended to diagnose MRSA     infection nor to guide or     monitor treatment for     MRSA infections.     Labs: Basic Metabolic Panel:  Recent Labs Lab 01/01/13 0420  NA 141  K 4.2  CL 103  CO2 24  GLUCOSE 139*  BUN 15  CREATININE 0.86  CALCIUM 8.8  MG 1.8  PHOS 3.3   Liver Function Tests:  Recent Labs Lab 01/01/13 0420  AST 16  ALT 14  ALKPHOS 53  BILITOT 0.4  PROT 6.2  ALBUMIN 3.7    Recent Labs Lab 01/01/13 0420  LIPASE 17   No results found for this basename: AMMONIA,  in the last 168 hours CBC:  Recent Labs Lab 01/01/13 0420  WBC 8.0  HGB 13.0  HCT 37.6*  MCV 89.1  PLT 201   Cardiac Enzymes:  Recent Labs Lab 01/01/13 0420 01/01/13 1017 01/01/13 1403  TROPONINI <0.30 <0.30 <0.30   BNP: BNP (last 3 results) No results found for this basename: PROBNP,  in the last 8760 hours CBG:  Recent Labs Lab 01/01/13 0816 01/01/13 1222  GLUCAP 136* 128*       Signed:  Hollice Espy  [facility] 01/01/2013, 4:32 PM

## 2013-01-01 NOTE — Care Management Note (Signed)
    Page 1 of 1   01/01/2013     8:42:44 AM   CARE MANAGEMENT NOTE 01/01/2013  Patient:  Roger Pruitt, Roger Pruitt   Account Number:  [id number]  Date Initiated:  01/01/2013  Documentation initiated by:  Junius Creamer  Subjective/Objective Assessment:   adm w htn     Action/Plan:   lives w wife, pcp dr Sherrie Mustache   Anticipated DC Date:     Anticipated DC Plan:        DC Planning Services  CM consult      Choice offered to / List presented to:             Status of service:   Medicare Important Message given?   (If response is "NO", the following Medicare IM given date fields will be blank) Date Medicare IM given:   Date Additional Medicare IM given:    Discharge Disposition:    Per UR Regulation:  Reviewed for med. necessity/level of care/duration of stay  If discussed at Long Length of Stay Meetings, dates discussed:    Comments:
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
## 2013-01-10 ENCOUNTER — Ambulatory Visit: Payer: Medicare Other | Admitting: Cardiovascular Disease

## 2013-01-14 ENCOUNTER — Ambulatory Visit (INDEPENDENT_AMBULATORY_CARE_PROVIDER_SITE_OTHER): Payer: Medicare Other | Admitting: Cardiovascular Disease

## 2013-01-14 ENCOUNTER — Encounter: Payer: Self-pay | Admitting: Cardiovascular Disease

## 2013-01-14 VITALS — BP 131/82 | HR 100 | Ht 75.0 in | Wt 235.0 lb

## 2013-01-14 DIAGNOSIS — I2510 Atherosclerotic heart disease of native coronary artery without angina pectoris: Secondary | ICD-10-CM

## 2013-01-14 DIAGNOSIS — R0789 Other chest pain: Secondary | ICD-10-CM

## 2013-01-14 DIAGNOSIS — E785 Hyperlipidemia, unspecified: Secondary | ICD-10-CM

## 2013-01-14 DIAGNOSIS — I10 Essential (primary) hypertension: Secondary | ICD-10-CM

## 2013-01-14 MED ORDER — CARVEDILOL 12.5 MG PO TABS: 12.5000 mg | ORAL_TABLET | Freq: Two times a day (BID) | ORAL | Status: DC

## 2013-01-14 NOTE — Patient Instructions (Signed)
Increase Carvedilol to 12.5 mg twice daily.  Follow up in 2 weeks.

## 2013-01-14 NOTE — Progress Notes (Signed)
Primary Care physician: Dr. Dario Guardian  HPI  This is a 62 year old male who is here today for a followup visit. He has known history of coronary artery disease, stent x2 to his circumflex, hypertension, hyperlipidemia, diabetes, obstructive sleep apnea, chronic pain, bipolar and schizophrenia. He had an abnormal stress test in March, 2014 which showed possible ischemia in the inferolateral wall. He underwent cardiac catheterization which showed patent stent in the left circumflex with moderate three-vessel disease without evidence of obstructive CAD. His coronary anatomy was unchanged from most recent cardiac catheterization. He was noted to be hypertensive with mildly elevated left ventricular end-diastolic pressure. He has chronic stable angina which has been reasonably controlled with medications. He was hospitalized recently at [facility] with atypical chest pain which seemed to be musculoskeletal. He seems to have borderline chronic elevation of troponin. He also had abdominal distention of unclear etiology that resolved without intervention. Abdominal ultrasound showed no evidence of ascites but there was fatty liver. The patient had previous history of alcohol abuse but not in the recent years. He has been having problems with uncontrolled hypertension. I switched him recently from metoprolol to carvedilol. He went to the [facility] at [facility] due to elevated blood pressure. He is feeling better now. He denies noncompliance with medications.   Allergies  Allergen Reactions  . Morphine And Related   . Synalgos-Dc [Aspirin-Caff-Dihydrocodeine] Itching    "Felt like 1000's bee's were stinging me"     Current Outpatient Prescriptions on File Prior to Visit  Medication Sig Dispense Refill  . amLODipine (NORVASC) 5 MG tablet Take 5 mg by mouth daily.       Marland Kitchen atorvastatin (LIPITOR) 80 MG tablet Take 80 mg by mouth at bedtime.      Marland Kitchen buPROPion (WELLBUTRIN XL) 300 MG 24 hr tablet Take 300 mg by  mouth daily.       . clopidogrel (PLAVIX) 75 MG tablet Take 75 mg by mouth daily.      . furosemide (LASIX) 40 MG tablet Take 40 mg by mouth daily.       . isosorbide mononitrate (IMDUR) 60 MG 24 hr tablet Take 60 mg by mouth daily.      Marland Kitchen lisinopril (PRINIVIL,ZESTRIL) 10 MG tablet Take 10 mg by mouth daily.      Marland Kitchen LORazepam (ATIVAN) 1 MG tablet Take 1 mg by mouth 3 (three) times daily.       . metoprolol succinate (TOPROL-XL) 50 MG 24 hr tablet Take 50 mg by mouth daily. Take with or immediately following a meal.      . nitroGLYCERIN (NITROSTAT) 0.4 MG SL tablet Place 0.4 mg under the tongue every 5 (five) minutes as needed.      Marland Kitchen oxcarbazepine (TRILEPTAL) 600 MG tablet Take 600 mg by mouth at bedtime.      . pantoprazole (PROTONIX) 40 MG tablet Take 40 mg by mouth daily.      . QUEtiapine (SEROQUEL XR) 400 MG 24 hr tablet Take 400 mg by mouth at bedtime.      . Tamsulosin HCl (FLOMAX) 0.4 MG CAPS Take 0.4 mg by mouth daily.      Marland Kitchen topiramate (TOPAMAX) 100 MG tablet Take 200 mg by mouth 2 (two) times daily.       . traMADol (ULTRAM) 50 MG tablet Take 50 mg by mouth every 6 (six) hours as needed for pain.      . traZODone (DESYREL) 50 MG tablet Take 50 mg by mouth at  bedtime.       No current facility-administered medications on file prior to visit.     Past Medical History  Diagnosis Date  . Diabetes mellitus     Type II  . Coronary artery disease   . Sleep apnea   . Esophageal reflux   . Disc degeneration, lumbar   . Bipolar disorder   . Hyperlipidemia   . MI (myocardial infarction) 2001;2012;2012  . Arthritis   . BPD (bronchopulmonary dysplasia)   . Panic attack   . Neuropathy   . Obsessive compulsive disorder   . Carpal tunnel syndrome   . History of bladder infections   . Unspecified vitamin D deficiency   . Other dysphagia   . Iron deficiency anemia, unspecified   . Hypertension      Past Surgical History  Procedure Laterality Date  . Tonsillectomy    . Back  surgery    . Skin graft    . Carpal tunnel release  2009    left  . Cardiac catheterization  6/10;2011;Aug.2012;Oct. 2012;   . Coronary angioplasty  2011 & 2001    s/p stent  . Cardiac catheterization  05/2012    [facility]; EF 60%, patent stent in the left circumflex, moderate three-vessel disease with no flow-limiting lesions. Unchanged from most recent catheterization     Family History  Problem Relation Age of Onset  . Heart disease Mother   . Heart disease Brother   . Heart disease Brother      History   Social History  . Marital Status: Married    Spouse Name: N/A    Number of Children: N/A  . Years of Education: N/A   Occupational History  . Not on file.   Social History Main Topics  . Smoking status: Former Smoker -- 15 years    Types: Cigarettes    Quit date: 05/17/2000  . Smokeless tobacco: Not on file  . Alcohol Use: No  . Drug Use: No  . Sexual Activity:    Other Topics Concern  . Not on file   Social History Narrative  . No narrative on file     PHYSICAL EXAM   BP 131/82  Pulse 100  Ht 6\' 3"  (1.905 m)  Wt 235 lb (106.595 kg)  BMI 29.37 kg/m2  Constitutional: He is oriented to person, place, and time. He appears well-developed and well-nourished. No distress.  HENT: No nasal discharge.  Head: Normocephalic and atraumatic.  Eyes: Pupils are equal and round. Right eye exhibits no discharge. Left eye exhibits no discharge.  Neck: Normal range of motion. Neck supple. No JVD present. No thyromegaly present.  Cardiovascular: Normal rate, regular rhythm, normal heart sounds and. Exam reveals no gallop and no friction rub. No murmur heard.  Pulmonary/Chest: Effort normal and breath sounds normal. No stridor. No respiratory distress. He has no wheezes. He has no rales. He exhibits no tenderness.  Abdominal: Soft. Bowel sounds are normal. He exhibits no distension. There is no tenderness. There is no rebound and no guarding.  Musculoskeletal: Normal range  of motion. He exhibits no edema and no tenderness.  Neurological: He is alert and oriented to person, place, and time. Coordination normal.  Skin: Skin is warm and dry. No rash noted. He is not diaphoretic. No erythema. No pallor.  Psychiatric: He has a normal mood and affect. His behavior is normal. Judgment and thought content normal.     EKG: Sinus  Rhythm  -Nonspecific QRS widening and anterior fascicular  block.   ABNORMAL     ASSESSMENT AND PLAN

## 2013-01-17 ENCOUNTER — Encounter: Payer: Self-pay | Admitting: Cardiovascular Disease

## 2013-01-17 NOTE — Assessment & Plan Note (Signed)
He is currently on atorvastatin 80 mg daily. This is followed by Dr.Jadali.

## 2013-01-17 NOTE — Assessment & Plan Note (Signed)
No symptoms of angina. Continue medical therapy. 

## 2013-01-17 NOTE — Assessment & Plan Note (Signed)
Blood pressure improved but he had another emergency room visit for high blood pressure. I will increase the dose of carvedilol to 12.5 mg twice daily. I discussed with him the importance of low sodium diet.

## 2013-01-24 ENCOUNTER — Ambulatory Visit: Payer: Self-pay | Admitting: Vascular Surgery

## 2013-01-24 LAB — BASIC METABOLIC PANEL
BUN: 20 mg/dL — ABNORMAL HIGH (ref 7–18)
Calcium, Total: 9 mg/dL (ref 8.5–10.1)
Chloride: 106 mmol/L (ref 98–107)
Co2: 31 mmol/L (ref 21–32)
Creatinine: 0.98 mg/dL (ref 0.60–1.30)
EGFR (Non-African Amer.): >60
Osmolality: 284 (ref 275–301)
Sodium: 141 mmol/L (ref 136–145)

## 2013-01-24 LAB — CBC
HCT: 37.6 % — ABNORMAL LOW (ref 40.0–52.0)
HGB: 13.1 g/dL (ref 13.0–18.0)
MCH: 31.1 pg (ref 26.0–34.0)
Platelet: 170 10*3/uL (ref 150–440)
WBC: 5.7 10*3/uL (ref 3.8–10.6)

## 2013-01-25 ENCOUNTER — Other Ambulatory Visit: Payer: Self-pay

## 2013-01-25 NOTE — Telephone Encounter (Signed)
[facility]. Please call

## 2013-01-25 NOTE — Telephone Encounter (Signed)
Refill called in for Lisinopril #30 R#3.

## 2013-01-25 NOTE — Telephone Encounter (Signed)
Error

## 2013-02-01 ENCOUNTER — Encounter: Payer: Self-pay | Admitting: Cardiovascular Disease

## 2013-02-01 ENCOUNTER — Ambulatory Visit (INDEPENDENT_AMBULATORY_CARE_PROVIDER_SITE_OTHER): Payer: Medicare Other | Admitting: Cardiovascular Disease

## 2013-02-01 VITALS — BP 160/103 | HR 68 | Ht 75.0 in | Wt 240.5 lb

## 2013-02-01 DIAGNOSIS — E785 Hyperlipidemia, unspecified: Secondary | ICD-10-CM

## 2013-02-01 DIAGNOSIS — I2510 Atherosclerotic heart disease of native coronary artery without angina pectoris: Secondary | ICD-10-CM

## 2013-02-01 DIAGNOSIS — I10 Essential (primary) hypertension: Secondary | ICD-10-CM

## 2013-02-01 MED ORDER — LISINOPRIL 20 MG PO TABS: 20.0000 mg | ORAL_TABLET | Freq: Every day | ORAL | Status: DC

## 2013-02-01 MED ORDER — CARVEDILOL 25 MG PO TABS: 25.0000 mg | ORAL_TABLET | Freq: Two times a day (BID) | ORAL | Status: DC

## 2013-02-01 NOTE — Progress Notes (Signed)
Primary Care physician: Dr. Dario Guardian  HPI  This is a 62 year old male who is here today for a followup visit. He has known history of coronary artery disease, stent x2 to his circumflex, hypertension, hyperlipidemia, diabetes, obstructive sleep apnea, chronic pain, bipolar and schizophrenia. He had an abnormal stress test in March, 2014 which showed possible ischemia in the inferolateral wall. He underwent cardiac catheterization which showed patent stent in the left circumflex with moderate three-vessel disease without evidence of obstructive CAD. His coronary anatomy was unchanged from most recent cardiac catheterization. He has chronic stable angina which has been reasonably controlled with medications. We have been having issues with uncontrolled hypertension. I recently increased the dose of carvedilol to 12.5 mg twice daily. Blood pressure is still elevated. He denies any chest pain or dyspnea.   Allergies  Allergen Reactions  . Morphine And Related   . Synalgos-Dc [Aspirin-Caff-Dihydrocodeine] Itching    "Felt like 1000's bee's were stinging me"     Current Outpatient Prescriptions on File Prior to Visit  Medication Sig Dispense Refill  . amLODipine (NORVASC) 5 MG tablet Take 5 mg by mouth daily.       Marland Kitchen atorvastatin (LIPITOR) 80 MG tablet Take 80 mg by mouth at bedtime.      Marland Kitchen buPROPion (WELLBUTRIN XL) 300 MG 24 hr tablet Take 300 mg by mouth daily.       . carvedilol (COREG) 12.5 MG tablet Take 1 tablet (12.5 mg total) by mouth 2 (two) times daily.  60 tablet  6  . clopidogrel (PLAVIX) 75 MG tablet Take 75 mg by mouth daily.      Marland Kitchen doxepin (SINEQUAN) 25 MG capsule Take 25 mg by mouth at bedtime.       . EQUETRO 200 MG CP12 12 hr capsule 200 mg 2 (two) times daily.       . furosemide (LASIX) 40 MG tablet Take 40 mg by mouth daily.       . isosorbide mononitrate (IMDUR) 60 MG 24 hr tablet Take 60 mg by mouth daily.      Marland Kitchen lisinopril (PRINIVIL,ZESTRIL) 10 MG tablet Take 10 mg by  mouth daily.      Marland Kitchen LORazepam (ATIVAN) 1 MG tablet Take 1 mg by mouth 3 (three) times daily.       . nitroGLYCERIN (NITROSTAT) 0.4 MG SL tablet Place 0.4 mg under the tongue every 5 (five) minutes as needed.      Marland Kitchen oxcarbazepine (TRILEPTAL) 600 MG tablet Take 600 mg by mouth at bedtime.      . Paliperidone Palmitate (INVEGA SUSTENNA IM) Inject into the muscle every 30 (thirty) days.      . pantoprazole (PROTONIX) 40 MG tablet Take 40 mg by mouth daily.      . QUEtiapine (SEROQUEL XR) 400 MG 24 hr tablet Take 400 mg by mouth at bedtime.      . ranolazine (RANEXA) 1000 MG SR tablet Take 500 mg by mouth 2 (two) times daily.      . Tamsulosin HCl (FLOMAX) 0.4 MG CAPS Take 0.4 mg by mouth daily.      Marland Kitchen topiramate (TOPAMAX) 100 MG tablet Take 200 mg by mouth 2 (two) times daily.       . traMADol (ULTRAM) 50 MG tablet Take 50 mg by mouth every 6 (six) hours as needed for pain.      . traZODone (DESYREL) 50 MG tablet Take 50 mg by mouth at bedtime.       No  current facility-administered medications on file prior to visit.     Past Medical History  Diagnosis Date  . Diabetes mellitus     Type II  . Coronary artery disease   . Sleep apnea   . Esophageal reflux   . Disc degeneration, lumbar   . Bipolar disorder   . Hyperlipidemia   . MI (myocardial infarction) 2001;2012;2012  . Arthritis   . BPD (bronchopulmonary dysplasia)   . Panic attack   . Neuropathy   . Obsessive compulsive disorder   . Carpal tunnel syndrome   . History of bladder infections   . Unspecified vitamin D deficiency   . Other dysphagia   . Iron deficiency anemia, unspecified   . Hypertension      Past Surgical History  Procedure Laterality Date  . Tonsillectomy    . Back surgery    . Skin graft    . Carpal tunnel release  2009    left  . Cardiac catheterization  6/10;2011;Aug.2012;Oct. 2012;   . Coronary angioplasty  2011 & 2001    s/p stent  . Cardiac catheterization  05/2012    [facility]; EF 60%, patent stent  in the left circumflex, moderate three-vessel disease with no flow-limiting lesions. Unchanged from most recent catheterization  . Cyst removal leg Right      Family History  Problem Relation Age of Onset  . Heart disease Mother   . Heart disease Brother   . Heart disease Brother      History   Social History  . Marital Status: Married    Spouse Name: N/A    Number of Children: N/A  . Years of Education: N/A   Occupational History  . Not on file.   Social History Main Topics  . Smoking status: Former Smoker -- 15 years    Types: Cigarettes    Quit date: 05/17/2000  . Smokeless tobacco: Not on file  . Alcohol Use: No  . Drug Use: No  . Sexual Activity: Not on file   Other Topics Concern  . Not on file   Social History Narrative  . No narrative on file     PHYSICAL EXAM   BP 160/103  Pulse 68  Ht 6\' 3"  (1.905 m)  Wt 240 lb 8 oz (109.09 kg)  BMI 30.06 kg/m2  Constitutional: He is oriented to person, place, and time. He appears well-developed and well-nourished. No distress.  HENT: No nasal discharge.  Head: Normocephalic and atraumatic.  Eyes: Pupils are equal and round. Right eye exhibits no discharge. Left eye exhibits no discharge.  Neck: Normal range of motion. Neck supple. No JVD present. No thyromegaly present.  Cardiovascular: Normal rate, regular rhythm, normal heart sounds and. Exam reveals no gallop and no friction rub. No murmur heard.  Pulmonary/Chest: Effort normal and breath sounds normal. No stridor. No respiratory distress. He has no wheezes. He has no rales. He exhibits no tenderness.  Abdominal: Soft. Bowel sounds are normal. He exhibits no distension. There is no tenderness. There is no rebound and no guarding.  Musculoskeletal: Normal range of motion. He exhibits no edema and no tenderness.  Neurological: He is alert and oriented to person, place, and time. Coordination normal.  Skin: Skin is warm and dry. No rash noted. He is not  diaphoretic. No erythema. No pallor.  Psychiatric: He has a normal mood and affect. His behavior is normal. Judgment and thought content normal.       ASSESSMENT AND PLAN

## 2013-02-01 NOTE — Assessment & Plan Note (Signed)
Continue treatment with atorvastatin. 

## 2013-02-01 NOTE — Assessment & Plan Note (Signed)
No angina continue medical therapy.

## 2013-02-01 NOTE — Assessment & Plan Note (Signed)
Blood pressure is still elevated. He denies noncompliance. I am increasing carvedilol to 25 mg twice daily and lisinopril to 20 mg once daily.

## 2013-02-01 NOTE — Patient Instructions (Signed)
Increase Carvedilol (Coreg) to 25 mg twice daily.  Increase Lisinopril to 20 mg once daily.   Follow up in 2 months.

## 2013-02-06 ENCOUNTER — Ambulatory Visit (INDEPENDENT_AMBULATORY_CARE_PROVIDER_SITE_OTHER): Payer: Medicare Other | Admitting: Podiatry

## 2013-02-06 ENCOUNTER — Ambulatory Visit: Payer: Medicaid Other | Admitting: Podiatry

## 2013-02-06 ENCOUNTER — Encounter: Payer: Self-pay | Admitting: Podiatry

## 2013-02-06 VITALS — BP 135/87 | HR 64 | Resp 16 | Ht 75.0 in | Wt 240.0 lb

## 2013-02-06 DIAGNOSIS — E1149 Type 2 diabetes mellitus with other diabetic neurological complication: Secondary | ICD-10-CM

## 2013-02-06 DIAGNOSIS — M79609 Pain in unspecified limb: Secondary | ICD-10-CM

## 2013-02-06 MED ORDER — GABAPENTIN 300 MG PO CAPS: 300.0000 mg | ORAL_CAPSULE | Freq: Three times a day (TID) | ORAL | Status: DC

## 2013-02-06 NOTE — Progress Notes (Signed)
Roger Pruitt presents today for followup of his gabapentin he states that he is been out of it for the past several months. He relates that his feet are returned back to normal with severe pain in the plantar aspect of the bilateral foot. States that his hemoglobin A1c is maintaining normal.  Objective: Vital signs are stable he is alert and oriented x3. Pulses are strongly palpable bilateral. Neurologic sensorium is decreased per since once the monofilament.  Assessment: Diabetic peripheral neuropathy bilateral.  Plan: Gabapentin 300 mg 1 by mouth 3 times a day.

## 2013-02-19 ENCOUNTER — Encounter: Payer: Self-pay | Admitting: *Deleted

## 2013-02-19 ENCOUNTER — Telehealth: Payer: Self-pay

## 2013-02-19 NOTE — Telephone Encounter (Signed)
Pt going for colonoscopy on Dec 5 and needs cardiac clearance. Procedure is with Dr Servando Snare [phone]. Pt woould like for [place] to call and give verbal conformation to his nurse. Ginger.

## 2013-02-19 NOTE — Telephone Encounter (Signed)
Needs cardiac clearance for colonoscopy for Dec 5. Please call.

## 2013-02-20 NOTE — Telephone Encounter (Signed)
Please send a note to GI that he is low risk for colonoscopy from a cardiac standpoint. Plavix can be stopped 5-7 days before procedure and resumed after.

## 2013-02-25 ENCOUNTER — Telehealth: Payer: Self-pay | Admitting: *Deleted

## 2013-02-25 ENCOUNTER — Encounter: Payer: Self-pay | Admitting: *Deleted

## 2013-02-25 NOTE — Telephone Encounter (Signed)
Pt informed that cardiac clearance letter was sent today. Pt stated he stopped his Plavix on 11/26.

## 2013-02-25 NOTE — Telephone Encounter (Signed)
Left voicemail for pt to stop plavix and call office on 11/24 and 12/1. Clearance and plavix instructions faxed to Dr. Vilma Prader office.

## 2013-04-04 ENCOUNTER — Ambulatory Visit (INDEPENDENT_AMBULATORY_CARE_PROVIDER_SITE_OTHER): Payer: Medicare Other | Admitting: Cardiovascular Disease

## 2013-04-04 ENCOUNTER — Encounter: Payer: Self-pay | Admitting: Cardiovascular Disease

## 2013-04-04 VITALS — BP 138/98 | HR 74 | Ht 75.0 in | Wt 240.2 lb

## 2013-04-04 DIAGNOSIS — E785 Hyperlipidemia, unspecified: Secondary | ICD-10-CM

## 2013-04-04 DIAGNOSIS — I10 Essential (primary) hypertension: Secondary | ICD-10-CM

## 2013-04-04 DIAGNOSIS — I2510 Atherosclerotic heart disease of native coronary artery without angina pectoris: Secondary | ICD-10-CM

## 2013-04-04 NOTE — Progress Notes (Signed)
Primary Care physician: Dr. Rosario Jacks  HPI  This is a 63 year old male who is here today for a followup visit. He has known history of coronary artery disease, stent x2 to his circumflex, hypertension, hyperlipidemia, diabetes, obstructive sleep apnea, chronic pain, bipolar and schizophrenia. He had an abnormal stress test in March, 2014 which showed possible ischemia in the inferolateral wall. He underwent cardiac catheterization which showed patent stent in the left circumflex with moderate three-vessel disease without evidence of obstructive CAD. His coronary anatomy was unchanged from most recent cardiac catheterization. He has chronic stable angina which has been reasonably controlled with medications. We have been having issues with uncontrolled hypertension. I recently increased the dose of both carvedilol and Lisinopril. He has been doing reasonably well. He fell this morning when he was trying to stand up quickly.    Allergies  Allergen Reactions  . Morphine And Related   . Synalgos-Dc [Aspirin-Caff-Dihydrocodeine] Itching    "Felt like 1000's bee's were stinging me"     Current Outpatient Prescriptions on File Prior to Visit  Medication Sig Dispense Refill  . amLODipine (NORVASC) 5 MG tablet Take 5 mg by mouth daily.       Marland Kitchen atorvastatin (LIPITOR) 80 MG tablet Take 80 mg by mouth at bedtime.      Marland Kitchen buPROPion (WELLBUTRIN XL) 300 MG 24 hr tablet Take 300 mg by mouth daily.       . carvedilol (COREG) 25 MG tablet Take 1 tablet (25 mg total) by mouth 2 (two) times daily.  60 tablet  6  . clopidogrel (PLAVIX) 75 MG tablet Take 75 mg by mouth daily.      Marland Kitchen doxepin (SINEQUAN) 25 MG capsule Take 25 mg by mouth at bedtime.       . EQUETRO 200 MG CP12 12 hr capsule 200 mg 2 (two) times daily.       . furosemide (LASIX) 40 MG tablet Take 40 mg by mouth daily.       Marland Kitchen gabapentin (NEURONTIN) 300 MG capsule Take 1 capsule (300 mg total) by mouth 3 (three) times daily.  90 capsule  11  .  isosorbide mononitrate (IMDUR) 60 MG 24 hr tablet Take 60 mg by mouth daily.      Marland Kitchen LORazepam (ATIVAN) 1 MG tablet Take 1 mg by mouth 3 (three) times daily.       . nitroGLYCERIN (NITROSTAT) 0.4 MG SL tablet Place 0.4 mg under the tongue every 5 (five) minutes as needed.      Marland Kitchen oxcarbazepine (TRILEPTAL) 600 MG tablet Take 600 mg by mouth at bedtime.      . Paliperidone Palmitate (INVEGA SUSTENNA IM) Inject into the muscle every 30 (thirty) days.      . pantoprazole (PROTONIX) 40 MG tablet Take 40 mg by mouth daily.      . QUEtiapine (SEROQUEL XR) 400 MG 24 hr tablet Take 400 mg by mouth at bedtime.      . ranolazine (RANEXA) 1000 MG SR tablet Take 500 mg by mouth 2 (two) times daily.      . Tamsulosin HCl (FLOMAX) 0.4 MG CAPS Take 0.4 mg by mouth daily.      Marland Kitchen topiramate (TOPAMAX) 100 MG tablet Take 200 mg by mouth 2 (two) times daily.       . traMADol (ULTRAM) 50 MG tablet Take 50 mg by mouth every 6 (six) hours as needed for pain.      . traZODone (DESYREL) 50 MG tablet Take 50  mg by mouth at bedtime.       No current facility-administered medications on file prior to visit.     Past Medical History  Diagnosis Date  . Diabetes mellitus     Type II  . Coronary artery disease   . Sleep apnea   . Esophageal reflux   . Disc degeneration, lumbar   . Bipolar disorder   . Hyperlipidemia   . MI (myocardial infarction) 2001;2012;2012  . Arthritis   . BPD (bronchopulmonary dysplasia)   . Panic attack   . Neuropathy   . Obsessive compulsive disorder   . Carpal tunnel syndrome   . History of bladder infections   . Unspecified vitamin D deficiency   . Other dysphagia   . Iron deficiency anemia, unspecified   . Hypertension      Past Surgical History  Procedure Laterality Date  . Tonsillectomy    . Back surgery    . Skin graft    . Carpal tunnel release  2009    left  . Cardiac catheterization  6/10;2011;Aug.2012;Oct. 2012;   . Coronary angioplasty  2011 & 2001    s/p stent  .  Cardiac catheterization  05/2012    [facility]; EF 60%, patent stent in the left circumflex, moderate three-vessel disease with no flow-limiting lesions. Unchanged from most recent catheterization  . Cyst removal leg Right      Family History  Problem Relation Age of Onset  . Heart disease Mother   . Heart disease Brother   . Heart disease Brother      History   Social History  . Marital Status: Married    Spouse Name: N/A    Number of Children: N/A  . Years of Education: N/A   Occupational History  . Not on file.   Social History Main Topics  . Smoking status: Former Smoker -- 15 years    Types: Cigarettes    Quit date: 05/17/2000  . Smokeless tobacco: Never Used  . Alcohol Use: No  . Drug Use: No  . Sexual Activity: Not on file   Other Topics Concern  . Not on file   Social History Narrative  . No narrative on file     PHYSICAL EXAM   BP 138/98  Pulse 74  Ht 6\' 3"  (1.905 m)  Wt 240 lb 4 oz (108.977 kg)  BMI 30.03 kg/m2  Constitutional: He is oriented to person, place, and time. He appears well-developed and well-nourished. No distress.  HENT: No nasal discharge.  Head: Normocephalic and atraumatic.  Eyes: Pupils are equal and round. Right eye exhibits no discharge. Left eye exhibits no discharge.  Neck: Normal range of motion. Neck supple. No JVD present. No thyromegaly present.  Cardiovascular: Normal rate, regular rhythm, normal heart sounds and. Exam reveals no gallop and no friction rub. No murmur heard.  Pulmonary/Chest: Effort normal and breath sounds normal. No stridor. No respiratory distress. He has no wheezes. He has no rales. He exhibits no tenderness.  Abdominal: Soft. Bowel sounds are normal. He exhibits no distension. There is no tenderness. There is no rebound and no guarding.  Musculoskeletal: Normal range of motion. He exhibits no edema and no tenderness.  Neurological: He is alert and oriented to person, place, and time. Coordination normal.    Skin: Skin is warm and dry. No rash noted. He is not diaphoretic. No erythema. No pallor.  Psychiatric: He has a normal mood and affect. His behavior is normal. Judgment and thought content normal.  ASSESSMENT AND PLAN

## 2013-04-04 NOTE — Assessment & Plan Note (Signed)
Blood pressure improved significantly after the gradually increasing the dose of carvedilol and lisinopril. Given his recent episode of what seems to be orthostatic hypotension, I am not increasing the dose of his antihypertensive medications today.

## 2013-04-04 NOTE — Assessment & Plan Note (Signed)
His angina is under good control with medical therapy.

## 2013-04-04 NOTE — Patient Instructions (Signed)
Your physician wants you to follow-up in:  6 months. You will receive a reminder letter in the mail two months in advance. If you don't receive a letter, please call our office to schedule the follow-up appointment.   

## 2013-04-04 NOTE — Assessment & Plan Note (Signed)
He is on atorvastatin 80 mg daily. This is being followed by Dr. Jadali.    

## 2013-07-27 ENCOUNTER — Ambulatory Visit: Payer: Self-pay | Admitting: Family Medicine

## 2013-08-06 ENCOUNTER — Other Ambulatory Visit: Payer: Self-pay | Admitting: Cardiovascular Disease

## 2013-08-08 ENCOUNTER — Other Ambulatory Visit: Payer: Self-pay

## 2013-08-09 ENCOUNTER — Other Ambulatory Visit: Payer: Self-pay

## 2013-08-09 ENCOUNTER — Telehealth: Payer: Self-pay

## 2013-08-09 MED ORDER — LISINOPRIL 20 MG PO TABS: 20.0000 mg | ORAL_TABLET | Freq: Every day | ORAL | Status: DC

## 2013-08-09 NOTE — Telephone Encounter (Signed)
In EPIC, when a prescription runs out it does show under active medication list. It shows under history as a "completed course". In general, these medications should always be refilled unless they were a one time course such as an antibiotic.

## 2013-08-09 NOTE — Telephone Encounter (Signed)
Per Dr. Fletcher Anon pt is to continue on the Lisinopril 20 mg.

## 2013-08-09 NOTE — Telephone Encounter (Signed)
Refill sent for Lisinopril 20 mg take one tablet daily.

## 2013-08-09 NOTE — Telephone Encounter (Signed)
Pharmacy calling for a refill on Lisinopril 20 mg take one tablet daily. I do not see on the medication list for the patient but in the notes, states Lisinopril 20 mg. Please advise if patient should be on Lisinopril 20 mg one tablet daily.

## 2013-09-24 NOTE — Telephone Encounter (Signed)
This encounter was created in error - please disregard.

## 2013-09-25 ENCOUNTER — Ambulatory Visit (INDEPENDENT_AMBULATORY_CARE_PROVIDER_SITE_OTHER): Payer: Medicare Other | Admitting: Podiatry

## 2013-09-25 VITALS — BP 142/110 | HR 73 | Resp 16

## 2013-09-25 DIAGNOSIS — B351 Tinea unguium: Secondary | ICD-10-CM

## 2013-09-25 DIAGNOSIS — M79669 Pain in unspecified lower leg: Secondary | ICD-10-CM

## 2013-09-25 DIAGNOSIS — M79609 Pain in unspecified limb: Secondary | ICD-10-CM

## 2013-09-25 DIAGNOSIS — E1149 Type 2 diabetes mellitus with other diabetic neurological complication: Secondary | ICD-10-CM

## 2013-09-26 NOTE — Progress Notes (Signed)
He presents today chief complaint of painful elongated toenails and occasional pain in his feet he cannot tell me having times a day he takes his gabapentin. He doesn't think he takes more than once or twice a day though it is written for 3 times a day.  Objective: Pulses are palpable bilateral. Hammertoe deformities are present bilateral. Nails are thick yellow dystrophic onychomycotic and painful palpation.  Assessment: Diabetic peripheral neuropathy is present with hammertoe deformities pain in limb secondary to onychomycosis 1 through 5 bilateral.  Plan: Discussed etiology pathology conservative versus surgical therapies debridement nails 1 through 5 bilateral covered service secondary to pain instructed him to bring his prescriptions within the next him he comes that we can consider increasing his Neurontin provided he is taking the this point.

## 2013-09-30 ENCOUNTER — Encounter: Payer: Self-pay | Admitting: *Deleted

## 2013-09-30 NOTE — Telephone Encounter (Signed)
This encounter was created in error - please disregard.

## 2013-10-03 ENCOUNTER — Ambulatory Visit: Payer: Medicare Other | Admitting: Cardiovascular Disease

## 2013-10-25 ENCOUNTER — Ambulatory Visit (INDEPENDENT_AMBULATORY_CARE_PROVIDER_SITE_OTHER): Payer: Medicare Other | Admitting: Cardiovascular Disease

## 2013-10-25 ENCOUNTER — Encounter: Payer: Self-pay | Admitting: Cardiovascular Disease

## 2013-10-25 VITALS — BP 138/98 | HR 62 | Ht 74.0 in | Wt 213.8 lb

## 2013-10-25 DIAGNOSIS — I209 Angina pectoris, unspecified: Secondary | ICD-10-CM

## 2013-10-25 DIAGNOSIS — I10 Essential (primary) hypertension: Secondary | ICD-10-CM

## 2013-10-25 DIAGNOSIS — R0602 Shortness of breath: Secondary | ICD-10-CM

## 2013-10-25 DIAGNOSIS — E785 Hyperlipidemia, unspecified: Secondary | ICD-10-CM

## 2013-10-25 DIAGNOSIS — I2510 Atherosclerotic heart disease of native coronary artery without angina pectoris: Secondary | ICD-10-CM

## 2013-10-25 DIAGNOSIS — I25118 Atherosclerotic heart disease of native coronary artery with other forms of angina pectoris: Secondary | ICD-10-CM

## 2013-10-25 DIAGNOSIS — Z955 Presence of coronary angioplasty implant and graft: Secondary | ICD-10-CM

## 2013-10-25 DIAGNOSIS — Z9861 Coronary angioplasty status: Secondary | ICD-10-CM

## 2013-10-25 MED ORDER — LISINOPRIL 40 MG PO TABS: 40.0000 mg | ORAL_TABLET | Freq: Every day | ORAL | Status: DC

## 2013-10-25 NOTE — Progress Notes (Signed)
Primary Care physician: Dr. Rosario Jacks  HPI  This is a 63 year old male who is here today for a followup visit. He has known history of coronary artery disease, stent x2 to his circumflex, hypertension, hyperlipidemia, diabetes, obstructive sleep apnea, chronic pain, bipolar and schizophrenia. He had an abnormal stress test in March, 2014 which showed possible ischemia in the inferolateral wall. He underwent cardiac catheterization which showed patent stent in the left circumflex with moderate three-vessel disease without evidence of obstructive CAD. His coronary anatomy was unchanged from most recent cardiac catheterization. He has chronic stable angina which has been reasonably controlled with medications. He has been doing well and denies chest pain or dyspnea.     Allergies  Allergen Reactions  . Morphine And Related   . Synalgos-Dc [Aspirin-Caff-Dihydrocodeine] Itching    "Felt like 1000's bee's were stinging me"     Current Outpatient Prescriptions on File Prior to Visit  Medication Sig Dispense Refill  . amLODipine (NORVASC) 5 MG tablet Take 5 mg by mouth daily.       Marland Kitchen atorvastatin (LIPITOR) 80 MG tablet Take 80 mg by mouth at bedtime.      Marland Kitchen buPROPion (WELLBUTRIN XL) 300 MG 24 hr tablet Take 300 mg by mouth daily.       . carvedilol (COREG) 25 MG tablet TAKE 1 TABLET BY MOUTH TWICE DAILY  60 tablet  3  . clopidogrel (PLAVIX) 75 MG tablet Take 75 mg by mouth daily.      Marland Kitchen doxepin (SINEQUAN) 25 MG capsule Take 25 mg by mouth at bedtime.       . EQUETRO 200 MG CP12 12 hr capsule 200 mg 2 (two) times daily.       . furosemide (LASIX) 40 MG tablet Take 40 mg by mouth daily.       Marland Kitchen gabapentin (NEURONTIN) 300 MG capsule Take 1 capsule (300 mg total) by mouth 3 (three) times daily.  90 capsule  11  . lisinopril (PRINIVIL,ZESTRIL) 20 MG tablet Take 1 tablet (20 mg total) by mouth daily.  30 tablet  11  . LORazepam (ATIVAN) 1 MG tablet Take 1 mg by mouth 3 (three) times daily.       .  metFORMIN (GLUCOPHAGE) 500 MG tablet Take 500 mg by mouth daily with breakfast.       . nitroGLYCERIN (NITROSTAT) 0.4 MG SL tablet Place 0.4 mg under the tongue every 5 (five) minutes as needed.      Marland Kitchen oxcarbazepine (TRILEPTAL) 600 MG tablet Take 600 mg by mouth at bedtime.      . Paliperidone Palmitate (INVEGA SUSTENNA IM) Inject into the muscle every 30 (thirty) days.      . pantoprazole (PROTONIX) 40 MG tablet Take 40 mg by mouth daily.      . QUEtiapine (SEROQUEL XR) 400 MG 24 hr tablet Take 400 mg by mouth at bedtime.      . ranolazine (RANEXA) 1000 MG SR tablet Take 1,000 mg by mouth daily.       . Tamsulosin HCl (FLOMAX) 0.4 MG CAPS Take 0.4 mg by mouth daily.      Marland Kitchen topiramate (TOPAMAX) 100 MG tablet Take 200 mg by mouth 2 (two) times daily.       . traMADol (ULTRAM) 50 MG tablet Take 50 mg by mouth every 6 (six) hours as needed for pain.       No current facility-administered medications on file prior to visit.     Past Medical History  Diagnosis Date  . Diabetes mellitus     Type II  . Coronary artery disease   . Sleep apnea   . Esophageal reflux   . Disc degeneration, lumbar   . Bipolar disorder   . Hyperlipidemia   . MI (myocardial infarction) 2001;2012;2012  . Arthritis   . BPD (bronchopulmonary dysplasia)   . Panic attack   . Neuropathy   . Obsessive compulsive disorder   . Carpal tunnel syndrome   . History of bladder infections   . Unspecified vitamin D deficiency   . Other dysphagia   . Iron deficiency anemia, unspecified   . Hypertension      Past Surgical History  Procedure Laterality Date  . Tonsillectomy    . Back surgery    . Skin graft    . Carpal tunnel release  2009    left  . Cardiac catheterization  6/10;2011;Aug.2012;Oct. 2012;   . Coronary angioplasty  2011 & 2001    s/p stent  . Cardiac catheterization  05/2012    [facility]; EF 60%, patent stent in the left circumflex, moderate three-vessel disease with no flow-limiting lesions. Unchanged  from most recent catheterization  . Cyst removal leg Right      Family History  Problem Relation Age of Onset  . Heart disease Mother   . Heart disease Brother   . Heart disease Brother      History   Social History  . Marital Status: Married    Spouse Name: N/A    Number of Children: N/A  . Years of Education: N/A   Occupational History  . Not on file.   Social History Main Topics  . Smoking status: Former Smoker -- 15 years    Types: Cigarettes    Quit date: 05/17/2000  . Smokeless tobacco: Never Used  . Alcohol Use: No  . Drug Use: No  . Sexual Activity: Not on file   Other Topics Concern  . Not on file   Social History Narrative  . No narrative on file     PHYSICAL EXAM   BP 138/98  Pulse 62  Ht 6\' 2"  (1.88 m)  Wt 213 lb 12 oz (96.956 kg)  BMI 27.43 kg/m2  Constitutional: He is oriented to person, place, and time. He appears well-developed and well-nourished. No distress.  HENT: No nasal discharge.  Head: Normocephalic and atraumatic.  Eyes: Pupils are equal and round. Right eye exhibits no discharge. Left eye exhibits no discharge.  Neck: Normal range of motion. Neck supple. No JVD present. No thyromegaly present.  Cardiovascular: Normal rate, regular rhythm, normal heart sounds and. Exam reveals no gallop and no friction rub. No murmur heard.  Pulmonary/Chest: Effort normal and breath sounds normal. No stridor. No respiratory distress. He has no wheezes. He has no rales. He exhibits no tenderness.  Abdominal: Soft. Bowel sounds are normal. He exhibits no distension. There is no tenderness. There is no rebound and no guarding.  Musculoskeletal: Normal range of motion. He exhibits no edema and no tenderness.  Neurological: He is alert and oriented to person, place, and time. Coordination normal.  Skin: Skin is warm and dry. No rash noted. He is not diaphoretic. No erythema. No pallor.  Psychiatric: He has a normal mood and affect. His behavior is  normal. Judgment and thought content normal.     EKG: Sinus  Rhythm  -Nonspecific QRS widening and anterior fascicular block.   -  Nonspecific T-abnormality.   ABNORMAL   ASSESSMENT AND  PLAN

## 2013-10-25 NOTE — Assessment & Plan Note (Signed)
BP is elevated. I increased Lisinopril to 40 mg once daily.

## 2013-10-25 NOTE — Assessment & Plan Note (Signed)
He is doing very well with no symptoms of angina. Continue medical therapy.

## 2013-10-25 NOTE — Assessment & Plan Note (Signed)
He is on atorvastatin 80 mg daily. This is being followed by Dr. Rosario Jacks.

## 2013-10-25 NOTE — Patient Instructions (Signed)
Your physician has recommended you make the following change in your medication:  Increase Lisinopril 40 mg once daily   Your physician wants you to follow-up in: 6 months. You will receive a reminder letter in the mail two months in advance. If you don't receive a letter, please call our office to schedule the follow-up appointment.

## 2013-11-20 ENCOUNTER — Other Ambulatory Visit: Payer: Self-pay | Admitting: *Deleted

## 2013-11-20 ENCOUNTER — Ambulatory Visit: Payer: Medicaid Other | Admitting: Podiatry

## 2013-11-20 MED ORDER — GABAPENTIN 300 MG PO CAPS: 300.0000 mg | ORAL_CAPSULE | Freq: Three times a day (TID) | ORAL | Status: DC

## 2013-11-20 NOTE — Telephone Encounter (Signed)
[facility] sent refill req for gabapentin 300 mg one by mouth 3 times daily. Per dr Milinda Pointer fill gabapentin #90 with 3 refills.

## 2013-11-27 ENCOUNTER — Emergency Department: Payer: Self-pay | Admitting: Emergency Medicine

## 2013-11-27 ENCOUNTER — Ambulatory Visit: Payer: Self-pay | Admitting: Urology

## 2013-12-09 ENCOUNTER — Other Ambulatory Visit: Payer: Self-pay | Admitting: Podiatry

## 2013-12-31 ENCOUNTER — Other Ambulatory Visit: Payer: Self-pay | Admitting: Cardiovascular Disease

## 2014-01-01 ENCOUNTER — Ambulatory Visit: Payer: Medicare Other | Admitting: Podiatry

## 2014-01-13 ENCOUNTER — Emergency Department: Payer: Self-pay | Admitting: Emergency Medicine

## 2014-01-13 LAB — BASIC METABOLIC PANEL
Anion Gap: 8 (ref 7–16)
BUN: 13 mg/dL (ref 7–18)
CHLORIDE: 107 mmol/L (ref 98–107)
CO2: 24 mmol/L (ref 21–32)
CREATININE: 0.7 mg/dL (ref 0.60–1.30)
Calcium, Total: 8.3 mg/dL — ABNORMAL LOW (ref 8.5–10.1)
EGFR (African American): >60
GLUCOSE: 73 mg/dL (ref 65–99)
Osmolality: 276 (ref 275–301)
Potassium: 4.3 mmol/L (ref 3.5–5.1)
Sodium: 139 mmol/L (ref 136–145)

## 2014-01-13 LAB — CBC WITH DIFFERENTIAL/PLATELET
BASOS ABS: 0.1 10*3/uL (ref 0.0–0.1)
Basophil %: 1 %
EOS PCT: 3.7 %
Eosinophil #: 0.2 10*3/uL (ref 0.0–0.7)
HCT: 39.3 % — AB (ref 40.0–52.0)
HGB: 13.2 g/dL (ref 13.0–18.0)
Lymphocyte #: 1.4 10*3/uL (ref 1.0–3.6)
Lymphocyte %: 24.7 %
MCH: 31.1 pg (ref 26.0–34.0)
MCHC: 33.5 g/dL (ref 32.0–36.0)
MCV: 93 fL (ref 80–100)
MONO ABS: 0.4 x10 3/mm (ref 0.2–1.0)
Monocyte %: 7.5 %
Neutrophil #: 3.5 10*3/uL (ref 1.4–6.5)
Neutrophil %: 63.1 %
Platelet: 195 10*3/uL (ref 150–440)
RBC: 4.24 10*6/uL — ABNORMAL LOW (ref 4.40–5.90)
RDW: 13.1 % (ref 11.5–14.5)
WBC: 5.5 10*3/uL (ref 3.8–10.6)

## 2014-01-13 LAB — TROPONIN I: Troponin-I: 0.15 ng/mL — ABNORMAL HIGH

## 2014-01-30 ENCOUNTER — Inpatient Hospital Stay: Payer: Self-pay | Admitting: Internal Medicine

## 2014-01-30 LAB — COMPREHENSIVE METABOLIC PANEL
ALT: 15 U/L
ANION GAP: 8 (ref 7–16)
Albumin: 3.4 g/dL (ref 3.4–5.0)
Alkaline Phosphatase: 58 U/L
BUN: 26 mg/dL — ABNORMAL HIGH (ref 7–18)
Bilirubin,Total: 0.8 mg/dL (ref 0.2–1.0)
CHLORIDE: 104 mmol/L (ref 98–107)
CREATININE: 1.72 mg/dL — AB (ref 0.60–1.30)
Calcium, Total: 8.6 mg/dL (ref 8.5–10.1)
Co2: 24 mmol/L (ref 21–32)
GFR CALC AF AMER: 52 — AB
GFR CALC NON AF AMER: 43 — AB
GLUCOSE: 122 mg/dL — AB (ref 65–99)
Osmolality: 278 (ref 275–301)
POTASSIUM: 4 mmol/L (ref 3.5–5.1)
SGOT(AST): 14 U/L — ABNORMAL LOW (ref 15–37)
SODIUM: 136 mmol/L (ref 136–145)
Total Protein: 6.6 g/dL (ref 6.4–8.2)

## 2014-01-30 LAB — PROTIME-INR
INR: 1.1
PROTHROMBIN TIME: 14.4 s (ref 11.5–14.7)

## 2014-01-30 LAB — DRUG SCREEN, URINE
AMPHETAMINES, UR SCREEN: NEGATIVE (ref ?–1000)
BARBITURATES, UR SCREEN: NEGATIVE (ref ?–200)
BENZODIAZEPINE, UR SCRN: NEGATIVE (ref ?–200)
CANNABINOID 50 NG, UR ~~LOC~~: NEGATIVE (ref ?–50)
Cocaine Metabolite,Ur ~~LOC~~: NEGATIVE (ref ?–300)
MDMA (Ecstasy)Ur Screen: POSITIVE (ref ?–500)
Methadone, Ur Screen: NEGATIVE (ref ?–300)
OPIATE, UR SCREEN: NEGATIVE (ref ?–300)
Phencyclidine (PCP) Ur S: NEGATIVE (ref ?–25)
Tricyclic, Ur Screen: POSITIVE (ref ?–1000)

## 2014-01-30 LAB — URINALYSIS, COMPLETE
BILIRUBIN, UR: NEGATIVE
GLUCOSE, UR: NEGATIVE mg/dL (ref 0–75)
Ketone: NEGATIVE
NITRITE: NEGATIVE
Ph: 6 (ref 4.5–8.0)
Protein: 100
RBC,UR: 8 /HPF (ref 0–5)
Specific Gravity: 1.013 (ref 1.003–1.030)
Squamous Epithelial: 2

## 2014-01-30 LAB — CBC
HCT: 38.3 % — ABNORMAL LOW (ref 40.0–52.0)
HGB: 12.8 g/dL — ABNORMAL LOW (ref 13.0–18.0)
MCH: 31.1 pg (ref 26.0–34.0)
MCHC: 33.4 g/dL (ref 32.0–36.0)
MCV: 93 fL (ref 80–100)
Platelet: 152 10*3/uL (ref 150–440)
RBC: 4.11 10*6/uL — ABNORMAL LOW (ref 4.40–5.90)
RDW: 13.6 % (ref 11.5–14.5)
WBC: 13 10*3/uL — ABNORMAL HIGH (ref 3.8–10.6)

## 2014-01-30 LAB — CK TOTAL AND CKMB (NOT AT ARMC)
CK, TOTAL: 51 U/L
CK-MB: <0.5 ng/mL — ABNORMAL LOW (ref 0.5–3.6)

## 2014-01-30 LAB — TROPONIN I
Troponin-I: 0.13 ng/mL — ABNORMAL HIGH
Troponin-I: 0.11 ng/mL — ABNORMAL HIGH
Troponin-I: 0.1 ng/mL — ABNORMAL HIGH

## 2014-01-30 LAB — ETHANOL

## 2014-01-30 LAB — ED INFLUENZA
H1N1 flu by pcr: NOT DETECTED
INFLAPCR: NEGATIVE
Influenza B By PCR: NEGATIVE

## 2014-01-30 LAB — AMMONIA: AMMONIA, PLASMA: 28 umol/L (ref 11–32)

## 2014-01-30 LAB — SALICYLATE LEVEL: Salicylates, Serum: <1.7 mg/dL

## 2014-01-30 LAB — TSH: Thyroid Stimulating Horm: 1.24 u[IU]/mL

## 2014-01-30 LAB — ACETAMINOPHEN LEVEL

## 2014-01-31 LAB — CBC WITH DIFFERENTIAL/PLATELET
BASOS PCT: 0.5 %
Basophil #: 0 10*3/uL (ref 0.0–0.1)
EOS ABS: 0.1 10*3/uL (ref 0.0–0.7)
Eosinophil %: 0.7 %
HCT: 29.8 % — ABNORMAL LOW (ref 40.0–52.0)
HGB: 10.1 g/dL — ABNORMAL LOW (ref 13.0–18.0)
LYMPHS ABS: 1 10*3/uL (ref 1.0–3.6)
LYMPHS PCT: 9.6 %
MCH: 31.8 pg (ref 26.0–34.0)
MCHC: 34.1 g/dL (ref 32.0–36.0)
MCV: 93 fL (ref 80–100)
Monocyte #: 0.8 x10 3/mm (ref 0.2–1.0)
Monocyte %: 8.3 %
NEUTROS ABS: 8.2 10*3/uL — AB (ref 1.4–6.5)
Neutrophil %: 80.9 %
Platelet: 121 10*3/uL — ABNORMAL LOW (ref 150–440)
RBC: 3.19 10*6/uL — ABNORMAL LOW (ref 4.40–5.90)
RDW: 13.3 % (ref 11.5–14.5)
WBC: 10.2 10*3/uL (ref 3.8–10.6)

## 2014-01-31 LAB — BASIC METABOLIC PANEL
ANION GAP: 7 (ref 7–16)
BUN: 24 mg/dL — AB (ref 7–18)
CALCIUM: 7.8 mg/dL — AB (ref 8.5–10.1)
CO2: 24 mmol/L (ref 21–32)
CREATININE: 1.03 mg/dL (ref 0.60–1.30)
Chloride: 109 mmol/L — ABNORMAL HIGH (ref 98–107)
EGFR (African American): >60
EGFR (Non-African Amer.): >60
GLUCOSE: 101 mg/dL — AB (ref 65–99)
Osmolality: 284 (ref 275–301)
Potassium: 3.7 mmol/L (ref 3.5–5.1)
Sodium: 140 mmol/L (ref 136–145)

## 2014-01-31 LAB — LIPID PANEL
CHOLESTEROL: 124 mg/dL (ref 0–200)
HDL Cholesterol: 25 mg/dL — ABNORMAL LOW (ref 40–60)
Ldl Cholesterol, Calc: 76 mg/dL (ref 0–100)
Triglycerides: 117 mg/dL (ref 0–200)
VLDL CHOLESTEROL, CALC: 23 mg/dL (ref 5–40)

## 2014-02-01 LAB — BASIC METABOLIC PANEL
Anion Gap: 7 (ref 7–16)
BUN: 16 mg/dL (ref 7–18)
CALCIUM: 8.3 mg/dL — AB (ref 8.5–10.1)
CO2: 23 mmol/L (ref 21–32)
CREATININE: 0.83 mg/dL (ref 0.60–1.30)
Chloride: 108 mmol/L — ABNORMAL HIGH (ref 98–107)
EGFR (Non-African Amer.): >60
Glucose: 101 mg/dL — ABNORMAL HIGH (ref 65–99)
Osmolality: 277 (ref 275–301)
POTASSIUM: 3.8 mmol/L (ref 3.5–5.1)
Sodium: 138 mmol/L (ref 136–145)

## 2014-02-01 LAB — CBC WITH DIFFERENTIAL/PLATELET
BASOS ABS: 0 10*3/uL (ref 0.0–0.1)
BASOS PCT: 0.4 %
EOS ABS: 0.2 10*3/uL (ref 0.0–0.7)
Eosinophil %: 2.3 %
HCT: 31.2 % — ABNORMAL LOW (ref 40.0–52.0)
HGB: 10.3 g/dL — AB (ref 13.0–18.0)
LYMPHS PCT: 10.2 %
Lymphocyte #: 0.8 10*3/uL — ABNORMAL LOW (ref 1.0–3.6)
MCH: 31.1 pg (ref 26.0–34.0)
MCHC: 33.1 g/dL (ref 32.0–36.0)
MCV: 94 fL (ref 80–100)
MONOS PCT: 6.9 %
Monocyte #: 0.5 x10 3/mm (ref 0.2–1.0)
NEUTROS PCT: 80.2 %
Neutrophil #: 6.3 10*3/uL (ref 1.4–6.5)
Platelet: 140 10*3/uL — ABNORMAL LOW (ref 150–440)
RBC: 3.32 10*6/uL — ABNORMAL LOW (ref 4.40–5.90)
RDW: 13.1 % (ref 11.5–14.5)
WBC: 7.8 10*3/uL (ref 3.8–10.6)

## 2014-02-02 LAB — CBC WITH DIFFERENTIAL/PLATELET
Basophil #: 0 10*3/uL (ref 0.0–0.1)
Basophil %: 0.6 %
Eosinophil #: 0.2 10*3/uL (ref 0.0–0.7)
Eosinophil %: 2.6 %
HCT: 32.4 % — ABNORMAL LOW (ref 40.0–52.0)
HGB: 11.1 g/dL — ABNORMAL LOW (ref 13.0–18.0)
Lymphocyte #: 1 10*3/uL (ref 1.0–3.6)
Lymphocyte %: 13.1 %
MCH: 31.7 pg (ref 26.0–34.0)
MCHC: 34.3 g/dL (ref 32.0–36.0)
MCV: 92 fL (ref 80–100)
MONOS PCT: 8.9 %
Monocyte #: 0.7 x10 3/mm (ref 0.2–1.0)
NEUTROS ABS: 5.8 10*3/uL (ref 1.4–6.5)
Neutrophil %: 74.8 %
Platelet: 161 10*3/uL (ref 150–440)
RBC: 3.5 10*6/uL — AB (ref 4.40–5.90)
RDW: 13.1 % (ref 11.5–14.5)
WBC: 7.8 10*3/uL (ref 3.8–10.6)

## 2014-02-02 LAB — BASIC METABOLIC PANEL
Anion Gap: 8 (ref 7–16)
BUN: 16 mg/dL (ref 7–18)
CREATININE: 0.81 mg/dL (ref 0.60–1.30)
Calcium, Total: 8.7 mg/dL (ref 8.5–10.1)
Chloride: 108 mmol/L — ABNORMAL HIGH (ref 98–107)
Co2: 26 mmol/L (ref 21–32)
EGFR (African American): >60
Glucose: 108 mg/dL — ABNORMAL HIGH (ref 65–99)
Osmolality: 285 (ref 275–301)
Potassium: 3.9 mmol/L (ref 3.5–5.1)
Sodium: 142 mmol/L (ref 136–145)

## 2014-02-02 LAB — URINE CULTURE

## 2014-02-03 LAB — BASIC METABOLIC PANEL
Anion Gap: 8 (ref 7–16)
BUN: 16 mg/dL (ref 7–18)
CALCIUM: 8.6 mg/dL (ref 8.5–10.1)
CHLORIDE: 104 mmol/L (ref 98–107)
CO2: 25 mmol/L (ref 21–32)
Creatinine: 0.86 mg/dL (ref 0.60–1.30)
EGFR (African American): >60
EGFR (Non-African Amer.): >60
Glucose: 124 mg/dL — ABNORMAL HIGH (ref 65–99)
Osmolality: 276 (ref 275–301)
POTASSIUM: 3.8 mmol/L (ref 3.5–5.1)
Sodium: 137 mmol/L (ref 136–145)

## 2014-02-03 LAB — CBC WITH DIFFERENTIAL/PLATELET
Basophil #: 0 10*3/uL (ref 0.0–0.1)
Basophil %: 0.5 %
Eosinophil #: 0.1 10*3/uL (ref 0.0–0.7)
Eosinophil %: 1.6 %
HCT: 35 % — AB (ref 40.0–52.0)
HGB: 11.5 g/dL — AB (ref 13.0–18.0)
Lymphocyte #: 0.9 10*3/uL — ABNORMAL LOW (ref 1.0–3.6)
Lymphocyte %: 12.8 %
MCH: 30.5 pg (ref 26.0–34.0)
MCHC: 32.8 g/dL (ref 32.0–36.0)
MCV: 93 fL (ref 80–100)
MONO ABS: 0.7 x10 3/mm (ref 0.2–1.0)
Monocyte %: 10.2 %
NEUTROS PCT: 74.9 %
Neutrophil #: 5.1 10*3/uL (ref 1.4–6.5)
Platelet: 174 10*3/uL (ref 150–440)
RBC: 3.76 10*6/uL — AB (ref 4.40–5.90)
RDW: 13.1 % (ref 11.5–14.5)
WBC: 6.8 10*3/uL (ref 3.8–10.6)

## 2014-02-04 LAB — CULTURE, BLOOD (SINGLE)

## 2014-02-10 ENCOUNTER — Ambulatory Visit: Payer: Medicaid Other | Admitting: Podiatry

## 2014-02-11 ENCOUNTER — Observation Stay: Payer: Self-pay | Admitting: Internal Medicine

## 2014-02-11 LAB — COMPREHENSIVE METABOLIC PANEL
ALBUMIN: 3.2 g/dL — AB (ref 3.4–5.0)
ALT: 16 U/L
ANION GAP: 2 — AB (ref 7–16)
AST: 9 U/L — AB (ref 15–37)
Alkaline Phosphatase: 58 U/L
BUN: 13 mg/dL (ref 7–18)
Bilirubin,Total: 0.2 mg/dL (ref 0.2–1.0)
CHLORIDE: 107 mmol/L (ref 98–107)
CREATININE: 1.05 mg/dL (ref 0.60–1.30)
Calcium, Total: 8.6 mg/dL (ref 8.5–10.1)
Co2: 29 mmol/L (ref 21–32)
Glucose: 109 mg/dL — ABNORMAL HIGH (ref 65–99)
Osmolality: 276 (ref 275–301)
POTASSIUM: 3.6 mmol/L (ref 3.5–5.1)
Sodium: 138 mmol/L (ref 136–145)
Total Protein: 6.2 g/dL — ABNORMAL LOW (ref 6.4–8.2)

## 2014-02-11 LAB — CK TOTAL AND CKMB (NOT AT ARMC)
CK, Total: 44 U/L
CK-MB: 0.9 ng/mL (ref 0.5–3.6)

## 2014-02-11 LAB — CBC
HCT: 34.5 % — ABNORMAL LOW (ref 40.0–52.0)
HGB: 11.7 g/dL — AB (ref 13.0–18.0)
MCH: 30.8 pg (ref 26.0–34.0)
MCHC: 33.8 g/dL (ref 32.0–36.0)
MCV: 91 fL (ref 80–100)
Platelet: 250 10*3/uL (ref 150–440)
RBC: 3.79 10*6/uL — ABNORMAL LOW (ref 4.40–5.90)
RDW: 13 % (ref 11.5–14.5)
WBC: 4.8 10*3/uL (ref 3.8–10.6)

## 2014-02-11 LAB — TROPONIN I
TROPONIN-I: 0.11 ng/mL — AB
Troponin-I: 0.12 ng/mL — ABNORMAL HIGH

## 2014-02-11 LAB — URINALYSIS, COMPLETE
BACTERIA: NONE SEEN
BILIRUBIN, UR: NEGATIVE
BLOOD: NEGATIVE
Glucose,UR: NEGATIVE mg/dL (ref 0–75)
Ketone: NEGATIVE
Leukocyte Esterase: NEGATIVE
Nitrite: NEGATIVE
PH: 7 (ref 4.5–8.0)
Protein: NEGATIVE
SPECIFIC GRAVITY: 1.006 (ref 1.003–1.030)
Squamous Epithelial: <1
WBC UR: 3 /HPF (ref 0–5)

## 2014-02-12 LAB — TROPONIN I: Troponin-I: 0.12 ng/mL — ABNORMAL HIGH

## 2014-03-03 ENCOUNTER — Telehealth: Payer: Self-pay | Admitting: Cardiovascular Disease

## 2014-03-03 NOTE — Telephone Encounter (Signed)
Pt wife calling wanted to speak to nurse. Please call patient.

## 2014-03-03 NOTE — Telephone Encounter (Signed)
Patients wife stated that she did not call us and is unsure why we are calling

## 2014-03-17 ENCOUNTER — Telehealth: Payer: Self-pay | Admitting: Cardiovascular Disease

## 2014-03-17 NOTE — Telephone Encounter (Signed)
[facility] calling needing a clearance that was sent to [place]. Pt is having a KPT laser of prostate. Needs to know when to stop meds.

## 2014-03-17 NOTE — Telephone Encounter (Signed)
Informed patient that clearance is in Dr. Jacklynn Ganong box awaiting approval  I will call when clearance is sent  Patient verbalized understanding

## 2014-03-19 ENCOUNTER — Other Ambulatory Visit: Payer: Self-pay | Admitting: Podiatry

## 2014-03-19 ENCOUNTER — Encounter: Payer: Self-pay | Admitting: *Deleted

## 2014-03-19 NOTE — Telephone Encounter (Signed)
He is at low risk from a cardiac standpoint. Hold Plavix 7 days before surgery and resume after.

## 2014-03-19 NOTE — Telephone Encounter (Signed)
Cardiac clearance and note faxed to [facility]  Patient aware of instructions

## 2014-03-25 ENCOUNTER — Ambulatory Visit: Payer: Self-pay | Admitting: Urology

## 2014-03-25 LAB — BASIC METABOLIC PANEL
Anion Gap: 7 (ref 7–16)
BUN: 14 mg/dL (ref 7–18)
CALCIUM: 9.2 mg/dL (ref 8.5–10.1)
CHLORIDE: 108 mmol/L — AB (ref 98–107)
CREATININE: 0.82 mg/dL (ref 0.60–1.30)
Co2: 27 mmol/L (ref 21–32)
EGFR (African American): >60
Glucose: 116 mg/dL — ABNORMAL HIGH (ref 65–99)
Osmolality: 285 (ref 275–301)
POTASSIUM: 3.4 mmol/L — AB (ref 3.5–5.1)
SODIUM: 142 mmol/L (ref 136–145)

## 2014-03-25 LAB — CBC
HCT: 38.4 % — AB (ref 40.0–52.0)
HGB: 12.8 g/dL — ABNORMAL LOW (ref 13.0–18.0)
MCH: 30.7 pg (ref 26.0–34.0)
MCHC: 33.3 g/dL (ref 32.0–36.0)
MCV: 93 fL (ref 80–100)
Platelet: 206 10*3/uL (ref 150–440)
RBC: 4.15 10*6/uL — ABNORMAL LOW (ref 4.40–5.90)
RDW: 13.6 % (ref 11.5–14.5)
WBC: 6.1 10*3/uL (ref 3.8–10.6)

## 2014-03-31 ENCOUNTER — Ambulatory Visit: Payer: Self-pay | Admitting: Urology

## 2014-04-11 ENCOUNTER — Ambulatory Visit: Payer: Medicare Other | Admitting: Cardiovascular Disease

## 2014-04-14 ENCOUNTER — Ambulatory Visit: Payer: Medicare Other | Admitting: Cardiovascular Disease

## 2014-04-16 ENCOUNTER — Other Ambulatory Visit: Payer: Self-pay | Admitting: Cardiovascular Disease

## 2014-05-26 ENCOUNTER — Other Ambulatory Visit: Payer: Self-pay | Admitting: Cardiovascular Disease

## 2014-06-02 ENCOUNTER — Ambulatory Visit: Payer: Medicaid Other | Admitting: Podiatry

## 2014-06-08 ENCOUNTER — Ambulatory Visit: Payer: Self-pay | Admitting: Family Medicine

## 2014-07-18 NOTE — Discharge Summary (Signed)
PATIENT NAME:  Roger Pruitt, Roger Pruitt MR#:  [id number] DATE OF BIRTH:  [birth year]  DATE OF ADMISSION:  12/03/2012 DATE OF DISCHARGE:  12/05/2012  DISCHARGE DIAGNOSES:  1.  Generalized weakness.  2.  Chest pain due to gastroesophageal reflux disease.  3.  Bipolar disorder.  4.  Coronary artery disease.  5.  Chronic angina.   DISCHARGE MEDICATIONS:  Pantoprazole 40 mg p.o. daily, Plavix 75 mg p.o. daily,  Invega 6 mg p.o. daily, Seroquel XR 400 mg p.o. daily, lisinopril 10 mg p.o. daily, Trileptal 600 mg p.o. daily, mirtazapine 30 mg p.o. daily, Topamax 200 mg p.o. b.i.d., trazodone 50 mg p.o. at bedtime, tamsulosin 0.4 mg p.o. daily, tramadol 50 mg p.o. every 4 hours, Ativan 1 mg p.o. t.i.d., bupropion xr 300 mg every day, Mobic 15 mg p.o. daily, Ranexa 1000 mg p.o. b.i.d., atorvastatin 80 mg p.o. daily, metoprolol succinate 25 mg p.o. daily, furosemide 40 mg p.o. daily, Imdur 60 mg p.o. daily, amlodipine 5 mg p.o. daily. Clonidine was discontinued from his medications.  Amlodipine has been added.    FOLLOW-UP:  The patient needs to follow with Dr. Fletcher Anon and also primary doctor, Dr. Rosario Jacks.   CONSULTATIONS:  Cardiology consult with Dr. Fletcher Anon.   HOSPITAL COURSE:  A 64 year old male patient admitted by me for generalized weakness, weight gain and also some episode of weakness.  The patient admitted for multiple things.  One is possible TIA versus CVA.  Another one is also possible non-ST-elevation MI because of troponin elevation.  The patient admitted to telemetry, started on aspirin, beta-blockers, statins, Ranexa, nitroglycerin and patient's troponins were monitored.  Initial troponins were 0.12 with the second one 0.12 as well.  The patient's CT head unremarkable and patient did not have chest pain, but seen by Dr. Fletcher Anon because of positive troponins and a history of coronary artery disease.  The patient was thought to have chronic troponin elevation.  Dr. Fletcher Anon said the patient has a history of chronic pains  in the chest, thought to have chronic angina, but this episode is not related to chronic angina.  The patient's echocardiogram showed EF more than 55% with no wall motion abnormality.  The patient also complained of weight gain, so ultrasound of abdomen was done to evaluate for any ascites.  Ultrasound showed   1. The study is somewhat limited due to bowel gas. There is hepatomegaly  with fatty infiltrative change. There is likely a stone within the fundus  of the gallbladder. 2. There is a nonobstructing stone in the upper pole of the right kidney.  There is a cyst in the left kidney. Neither kidney exhibits  hydronephrosis.  changes and also nonobstructing probable stone in the right kidney.  The patient had a history of heavy alcohol abuse, but ultrasound negative for ascites.    Regarding stroke, the patient's CAT scan of the head is negative.  He got an MRI because of some speech difficulties.  The patient has no evidence of stroke on MRI.  The patient also seen by physical therapy because of generalized weakness.  Physical therapy said the patient can go home and the patient was ambulating fine with physical therapy.  They did not recommend any skilled physical therapy needs.    The patient does have bipolar disorder and was admitted to [facility] before.  The patient is on multiple psychiatric medications that can probably cause him to have some dizziness and also history of falls.  He needs to follow up with his psychiatry  doctor to adjust the medications.   Regarding his chronic angina, the patient is on Imdur 30 mg daily.  Dr. Fletcher Anon recommended to increase to 60 mg and also clonidine was stopped and amlodipine was started for an additional antianginal med.  Also, advised to continue high-dose statins and Ranexa along with aspirin and Plavix.   Time spent on discharge preparation more than 30 minutes.     ____________________________ Epifanio Lesches, MD sk:ea D: 12/06/2012 21:52:00  ET T: 12/06/2012 23:23:54 ET JOB#: [id number]  cc: Muhammad A. Fletcher Anon, MD Isla Pence, MD Epifanio Lesches, MD, <Dictator>   Epifanio Lesches MD ELECTRONICALLY SIGNED 12/19/2012 21:58

## 2014-07-18 NOTE — Op Note (Signed)
PATIENT NAME:  Roger Pruitt, Roger Pruitt MR#:  [id number] DATE OF BIRTH:  1951-01-23  DATE OF PROCEDURE:  01/24/2013  PREOPERATIVE DIAGNOSIS: Symptomatic right leg mass.   POSTOPERATIVE DIAGNOSIS: Symptomatic right leg mass.   PROCEDURE: Excision of right leg mass.   SURGEON: Algernon Huxley, M.D.   ANESTHESIA: MAC.   ESTIMATED BLOOD LOSS: Minimal.   INDICATION FOR PROCEDURE: This is a 64 year old male who presented to my office last week with a small mass in his right thigh that was painful. There was what appeared to be a varicose vein associated with this and the possibility of a small venous malformation was raised, which was why he was referred to our office. He desired removal of this. Risks and benefits were discussed. Informed consent was obtained.   DESCRIPTION OF PROCEDURE: The patient was brought to the operative suite and after an adequate level of intravenous sedation was obtained, the area was sterilely prepped and draped and a sterile surgical field was created. An oblique incision was created in an elliptical fashion around the mass. This was dissected out without difficulty. There was a little plexus of veins in a cystic structure that was probably about 2 cm in size. This was removed and sent to pathology. The wound was then closed with 3-0 Vicryl and 4-0 nylon. Sterile dressing was placed. The patient tolerated the procedure well and was taken to the recovery room in stable condition.   ____________________________ Algernon Huxley, MD jsd:jm D: 01/24/2013 16:08:00 ET T: 01/24/2013 18:32:11 ET JOB#: [id number]  cc: Algernon Huxley, MD, <Dictator> Algernon Huxley MD ELECTRONICALLY SIGNED 01/28/2013 9:57

## 2014-07-18 NOTE — H&P (Signed)
PATIENT NAME:  Roger Pruitt, Roger Pruitt MR#:  [id number] DATE OF BIRTH:  January 12, 1951  DATE OF ADMISSION:  12/03/2012  CHIEF COMPLAINT: Difficulty in ambulation, and chest pain.   HISTORY OF PRESENT ILLNESS: A 64 year old male patient with history of hypertension, diabetes, coronary artery disease, chronic back pains, and bipolar disorder came in because of generalized weakness getting progressively worse for the past 3 to 4 days. The patient's is bumping into  things and also his wife noted that he took a long time to come out of the bathtub 2 to 3 days ago. Denies any history of falls. No history of injury to legs, and the patient uses a cane at home, but recently weakness is so large that he is feeling legs are giving out.  The patient also complains of positional chest pain which is going on and off. The patient says the chest pain is not new. No radiation to the back or hands. No nausea or vomiting, but does complain of shortness of breath and cough. Denies any fever.   The patient's past medical history is significant for a history of hypertension, bipolar disorder, coronary artery disease, history of obstructive sleep apnea, chronic back pain, arthritis, diabetes mellitus, and also neuropathy.   The patient had a weight gain of around 30 pounds in 1 month, according to the wife.   PAST SURGICAL HISTORY: The patient had a bilateral uvulopalatopharyngoplasty.  ALLERGIES: The patient is allergic to [facility] and to MORPHINE.   SOCIAL HISTORY: Previous alcoholic and previous smoker, and no drugs. The patient was in jail before.   FAMILY HISTORY: The patient had a brother who died of a massive heart attack. Mother had a stroke.   PAST MEDICAL HISTORY: Includes coronary artery disease with MI in 2001 when he had a stent, and this was mentioned in 2012 by Dr. Fletcher Anon.  PRESENT MEDICATONS:  1.  Ativan 1 mg p.o. t.i.d. 2.  Atorvastatin 80 mg p.o. daily.  3.  Bupropion 300 mg.  4.  seroquel xr 400 mg po daily   5.  Clonidine 0.2 mg p.o. b.i.d. 6.  Plavix 75 mg p.o. daily.  7.  Invega 6 mg at night.  8.  Imdur 30 mg, one-half tablet daily.  9.  Lisinopril 10 mg daily.  10.  Metoprolol succinate 25 mg p.o. daily.  11.  (remeron 30 mg at bedtime.  12.  Mobic; discontinued.  13.  Pantoprazole 40 mg p.o. daily.  14.  Ranexa 1000 mg p.o. b.i.d.  15.  Seroquel XR 400 mg p.o. daily.  16.  Tamsulosin 0.4 mg at bedtime.  17.  Topamax 200 mg p.o. b.i.d.  18.  Tramadol 50 mg every 6 hours as needed for pain.  19.  Trazodone 50 mg at bedtime.  20.  Trileptal 600 mg p.o. at bedtime.   REVIEW OF SYSTEMS:  CONSTITUTIONAL: Feels weak and fatigued. Denies any fever.  EYES: No blurred vision.  ENT: No tinnitus. No epistaxis. No difficulty swallowing.  RESPIRATORY: Complains of some occasional shortness of breath, and also has some cough with phlegm.  CARDIOVASCULAR: Has mid-sternal chest pain going on for a while. No orthopnea. No pedal edema.  GASTROINTESTINAL: Has no nausea. No vomiting. No abdominal pain.  GENITOURINARY: No dysuria. Complains of burning urination.  ENDOCRINE: No polyuria or nocturia.  HEMATOLOGIC: No anemia.  INTEGUMENTARY: No skin rashes.  MUSCULOSKELETAL: Complains of chronic back pain.  NEUROLOGIC: No history of strokes, but has neuropathy.  PSYCHIATRIC: Has a history of bipolar disorder.  PHYSICAL EXAMINATION: VITAL SIGNS: Temperature is 97.6, heart rate 69, blood pressure 137/74, saturations 97% on room air.  GENERAL: He is an alert, awake, oriented well-developed, well-nourished male, not in distress.  HEENT: Head normocephalic, atraumatic.  EYES: Pupils equally reacting to light. No conjunctival pallor. No scleral icterus.  NOSE: No turbinate hypertrophy.  EARS: No tympanic membrane congestion.  MOUTH: No erythema or edema.  NECK: Supple. No JVD. No carotid bruits.  RESPIRATORY: Bilaterally clear to auscultation. No wheeze. No rales.  CARDIOVASCULAR: S1, S2 regular. No  murmurs. PMI not displaced. Pulses are equal in carotid, femoral and dorsalis pedis. The patient has trace pedal edema.  GASTROINTESTINAL: Abdomen is soft, nontender, slightly distended. Bowel sounds are present. No organomegaly.  MUSCULOSKELETAL: The patient's extremities move x 4.  SKIN: Has no skin rashes.  VASCULAR: Good pedal pulses.  NEUROLOGIC: The patient is awake, alert, and oriented. Cranial nerves II-XII intact. DTRs  2+ bilaterally. Strength 4 x 4 in upper and lower extremities.  PSYCH: The patient is alert, awake, oriented. No hallucinations or delusions.   LABORATORY DATA: WBC 5.4, hemoglobin 12.9, hematocrit 37.2, platelets 148.   Electrolytes: Sodium 138, potassium 3.3, chloride 105, bicarbonate 28, BUN 17, creatinine  1.09, glucose 160.   The patient's chest x-ray is negative for acute cardiopulmonary disease.   Ammonia is slightly up at 42, troponins are 0.12.   CT head shows no acute abnormality.   The patient had troponins this morning. At 3:30 it was 0.12, and the second one drawn at 6:20, that is within three hours, was 0.12.   The patient did not have any CK or other labs drawn.   EKG shows normal sinus rhythm with a first-degree AV block, 71 beats minutes. No ST-T changes.   ASSESSMENT AND PLAN: The patient is a 64 year old male patient with chest pain, positive troponins. The patient has a history of coronary artery disease, hypertension and also a history of stent placement.   1.  Admit him to telemetry: The patient sees Dr. Fletcher Anon. Will consult him. At this time we are going to recheck the troponins every 8 hours for 1 more set, and along with CK and CPK-MB. The patient is already on aspirin and Plavix, Ranexa, beta blockers, ACE inhibitors, Imdur, and continue all those along with statins, and obtain cardiology consult with Dr. Fletcher Anon. Get an echocardiogram. 2.  Generalized weakness, getting progressively worse: The patient will be seen with physical therapy.   3.  Bipolar disorder, on multiple psychiatric medications including Seroquel, trazodone, Topamax and Ativan. Continue them.  4.  Elevated ammonia levels on admission: The patient has no confusion. Has no liver disease. Follow the trend. At this time will hold off on any further testing. The patient is not encephalopathic. Note that the patient was seen by the ER doctor and will hold off on any further testing. The patient is not encephalopathic. Note the patient's mental status is clear and he denies any confusion episodes, and the reason he came is generalized weakness and chest pain. The patient does have elevated ammonia at this time. It is probably secondary to multiple psychiatric medications, but has no liver disease so we will follow the trend.  5.  Chronic back pain, on tramadol 50 mg every 6 hours p.r.n. Continue that.  6.  Benign prostatic hypertrophy: Continue Flomax at 4 mg daily.   Time spent on history and physical is about 55 minutes.   This is an inpatient admission.     ____________________________  Epifanio Lesches, MD sk:dm D: 12/03/2012 08:56:26 ET T: 12/03/2012 09:17:47 ET JOB#: [id number]  cc: Epifanio Lesches, MD, <Dictator> Isla Pence, MD Epifanio Lesches MD ELECTRONICALLY SIGNED 12/29/2012 23:03

## 2014-07-18 NOTE — Consult Note (Signed)
General Aspect [name] is a 64 yo male w/ PMHx s/f CAD s/p PCI-LCx x 2, nonobstructive CAD and patent LCx stents on cath 06/22/12, chronic pain, schizophrenia, HTN, HLD, DM2, OSA, h/o EtOH abuse and bipolar disorder who was admitted to [facility] today for chest pain, + troponin.   He had a stress test earlier this year revealing changes c/w inferolateral ischemia. Cardiac cath 06/22/12 revealed moderate, nonobstructive CAD and patent LCx stents, mildly elevated LVEDP, moderate apical HK, EF 80% and a "spade-like" appearance of the LV. Aggressive medical therapy was recommended. He was seen in follow-up 06/2012. He has chronic stable angina (two brief episodes/week not requiring NTG). He noted exertional dyspnea. HCTZ was added given evidence of elevated LVEDP on cath. He is on Plavix/ACEi/Toprol-XL/statin/Ranexa 1000/Imdur 30/NTG SL PRN. He takes clonidine for HTN, There was consideration to transition to Norvasc for dual antianginal/antihypertensive benefit.  He reports experiencing generalized weakness over the past several weeks, mostly in his legs. He does note an episode of slurred speech and facial droop several days ago. Denies numbness. The weakness progressively worsened to the point where he could not lift himself from a seated or laying position and he presented to the ED.   He reports experiencing sharp substernal chest pain rated at a 6/10 without radiation lasting for several hours at a time. He relates this to his prior angina. He does note that these episodes have become more frequent. He does report exertional dyspnea. He notes a 40 lbs weight gain and significant abdominal distention. No LE edema. He continues to take all of his medications as prescribed. He was at first unsure if he was still taking Ranexa, however. Denies EtOH, tobacco or illicit drug use.   Present Illness In the ED, EKG revealed NSR, 1st degree AVB, widened QRS (134 ms), 71 bpm, LAD, no ST/T changes. TnI 0.12 x 2.  Noncontrast head CT- no acute abnormality. CXR w/o cardiopulmonary dz. BMET- K 3.2. A1C 7.2%. UDS- + opiates, MDMA. CBC- mild normocytic anemia Hgb 12.9/37.2, mild thrombocytopenia PLT 148 K. Ammonia level 42. Ethanol <0.003%. LFTs WNL.   PAST SURGICAL HISTORY: The patient had a bilateral uvulopalatopharyngoplasty.  ALLERGIES: The patient is allergic to [facility] and to MORPHINE.   SOCIAL HISTORY: Previous alcoholic and previous smoker, and no drugs. The patient was in jail before.   FAMILY HISTORY: The patient had a brother who died of a massive heart attack. Mother had a stroke.   PAST MEDICAL HISTORY: Includes coronary artery disease with MI in 2001 when he had a stent, and this was mentioned in 2012 by Dr. Fletcher Anon.   Physical Exam:  GEN no acute distress, obese   HEENT pink conjunctivae, PERRL, hearing intact to voice, + hearing aid   NECK supple  No masses  trachea midline  no appreciable JVD   RESP normal resp effort  clear BS  no use of accessory muscles  no wheezes, rales or rhonchi   CARD Regular rate and rhythm  Normal, S1, S2  II/VI systolic crescendo-decrescendo murmur at RUSB   ABD denies tenderness  soft  distant bowel sounds, fluid shift appreciated   EXTR negative cyanosis/clubbing, negative edema   SKIN normal to palpation   NEURO follows commands, motor/sensory function intact, no appreciable facial droop, equal strength to resisted ankle and hip flexion/extension, hand grip with equal strength   PSYCH alert, A+O to time, place, person   Review of Systems:  Subjective/Chief Complaint weakness   General: Weight loss or gain  Weakness   Respiratory: dyspnea on exertion   Cardiovascular: Chest pain or discomfort  Dyspnea   Gastrointestinal: abdominal distention   Neurologic: slurred speech, facial droop, bilateral leg weakness   Home Medications: Medication Instructions Status  Mobic 15 mg tablet 1 tab(s) orally once a day x 10 days Active  Trileptal 600  mg oral tablet  orally once a day (at bedtime) Active  mirtazapine 30 mg oral tablet 1 tab(s) orally once a day (at bedtime) Active  Topamax 200 mg oral tablet 1 tab(s) orally 2 times a day Active  trazodone 50 mg oral tablet  orally once a day (at bedtime) Active  tamsulosin 0.4 mg oral capsule 1 cap(s) orally once a day Active  tramadol 50 mg oral tablet 1 tab(s) orally every 4 hours Active  Ativan 1 mg oral tablet 1 tab(s) orally 3 times a day Active  buPROPion 300 mg/24 hours oral tablet, extended release 1 tab(s) orally every 24 hours Active  Ranexa 1000 mg oral tablet, extended release 1 tab(s) orally 2 times a day Active  pantoprazole 40 mg oral delayed release tablet 1 tab(s) orally once a day Active  clopidogrel 75 mg oral tablet 1 tab(s) orally once a day Active  Invega 6 mg oral tablet, extended release 1 tab(s) orally once a day (at bedtime) Active  Seroquel XR 400 mg oral tablet, extended release 1 tab(s) orally once a day (at bedtime) Active  atorvastatin 80 mg oral tablet 1 tab(s) orally once a day (at bedtime) Active  clonidine 0.2 mg oral tablet 1 tab(s) orally 2 times a day Active  lisinopril 10 mg oral tablet 1 tab(s) orally once a day Active  metoprolol succinate 25 mg oral tablet, extended release 1 tab(s) orally once a day Active  isosorbide mononitrate 30 mg oral tablet, extended release 0.5 tab(s) orally once a day Active   Lab Results:  Routine Chem:  08-Sep-14 03:32   Hemoglobin A1c ([facility])  7.2 (The American Diabetes Association recommends that a primary goal of therapy should be <7% and that physicians should reevaluate the treatment regimen in patients with HbA1c values consistently >8%.)    06:26   Ammonia, Plasma  42 (Result(s) reported on 03 Dec 2012 at 07:18AM.)  Cardiac:  08-Sep-14 03:32   Troponin I  0.12 (0.00-0.05 0.05 ng/mL or less: NEGATIVE  Repeat testing in 3-6 hrs  if clinically indicated. >0.05 ng/mL: POTENTIAL  MYOCARDIAL INJURY. Repeat   testing in 3-6 hrs if  clinically indicated. NOTE: An increase or decrease  of 30% or more on serial  testing suggests a  clinically important change)    06:26   Troponin I  0.12 (0.00-0.05 0.05 ng/mL or less: NEGATIVE  Repeat testing in 3-6 hrs  if clinically indicated. >0.05 ng/mL: POTENTIAL  MYOCARDIAL INJURY. Repeat  testing in 3-6 hrs if  clinically indicated. NOTE: An increase or decrease  of 30% or more on serial  testing suggests a  clinically important change)    Synalgos DC: Itching  Morphine: N/V/Diarrhea  Vital Signs/Nurse's Notes: **Vital Signs.:   08-Sep-14 09:53  Vital Signs Type Admission  Temperature Temperature (F) 97.3  Celsius 36.2  Temperature Source oral  Pulse Pulse 65  Respirations Respirations 20  Systolic BP Systolic BP 284  Diastolic BP (mmHg) Diastolic BP (mmHg) 132  Mean BP 117  Pulse Ox % Pulse Ox % 96  Pulse Ox Activity Level  At rest  Oxygen Delivery Room Air/ 21 %    Impression 64  yo male w/ PMHx s/f CAD s/p PCI-LCx x 2, nonobstructive CAD and patent LCx stents on cath 06/22/12, chronic pain, schizophrenia, HTN, HLD, DM2, OSA and bipolar disorder who was admitted to [facility] today for chest pain, + troponin.  1. Atypical chest pain, elevated troponin, h/o CAD s/p PCI The patient reports experiencing sharp substernal chest pain lasting for several hours aggravated at times by position changes and inspiration. He he relates this to his chronic anginal pain. Description itself is fairly atypical and more consistent with pleuritic pain. He had a cardiac cath < 6 months revealing stable, moderate, nonobstructive CAD and a preserved EF. He does have a history of CAD s/p PCI. He is on several antianginals. There was a question whether he is still taking Ranexa. Objectively, EKG indicates no ischemic changes. Troponin-I mildly elevated at 0.12. Would not pursue further ischemic work-up. Angina is overall fairly stable.       -- Obtain one additional  troponin to evaluate trend      -- Ensure Ranexa adherance      -- Could increase Imdur to 60mg  daily as nitrate tolerance may have developed or transition from clonidine to amlodipine for added antianginal benefit      -- Continue Plavix/lisinopril/Toprol-XL/atorvastatin/NTG SL PRN  2. Ascites  He reports exertional dyspnea, 40 lbs weight gain and significant abdominal distention which is appreciated on exam. Lungs are clear, no appreciable JVD or LE edema. CXR w/o evidence of pulmonary edema. The patient has a history of significant EtOH abuse ~ 30 years ago. Elevated ammonia level. Normal LFTs.       -- Check BNP to evaluate for R sided CHF as a cause for ascites      -- Review echo, R sided pressures      -- Consider abdominal u/s +/- diagnostic paracentesis   3. Weakness No focal neuro abnormality on exam. He reports experiencing slurred speech and facial droop recently. History mentions incoordination. Noncon head CT unremarkable. -- Consider brain MRI   Plan 3. HTN BP 153/100.  -- Continue current antihypertensives. Adjustments as above.   4. HLD -- Continue high dose atorvastatin.   5. Type 2 DM A1C 7.2%.  -- Consider starting oral agent.  6. Positive MDMA on UDS Patient denies illicit drug use. Pharmacy reports outpatient bupropion may cause this finding.   Electronic Signatures for Addendum Section:  Kathlyn Sacramento (MD) (Signed Addendum 08-Sep-14 13:30)  The patient was seen and examined. Agree with the above. He is well known to me. Cardiac cath in 05/2012 showed patent stents without obstructive disease. He has chronic angina which has not worsened. He presented with weakness and inability to stand. He reports 40 lbs of weight gain with abdominal distension.  TnI is mildly elevated but not trending up.  Given no worsening of angina and recent cath in 03/14, I recommend continuing medical therapy. I am concerned about his rapid weight gain and ascitis. No signs of right  sided heart failure. Echo showed normal EF with no evidence of pulmonary hypertension.  I recommend an abdominal ultrasound and diagnostic/theraputic paracentesis.   Electronic Signatures: Meriel Pica (PA-C)  (Signed 08-Sep-14 12:29)  Authored: General Aspect/Present Illness, History and Physical Exam, Review of System, Home Medications, Labs, Allergies, Vital Signs/Nurse's Notes, Impression/Plan Kathlyn Sacramento (MD)  (Signed 08-Sep-14 13:30)  Co-Signer: General Aspect/Present Illness, Home Medications, Allergies, Impression/Plan   Last Updated: 08-Sep-14 13:30 by Kathlyn Sacramento (MD)
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
## 2014-07-19 NOTE — H&P (Signed)
PATIENT NAME:  Roger Pruitt, Roger Pruitt MR#:  [id number] DATE OF BIRTH:  [birth year]  DATE OF ADMISSION:  01/30/2014  PRIMARY CARE PHYSICIAN:  Isla Pence, MD  CARDIOLOGIST:  Kathlyn Sacramento, MD  CHIEF COMPLAINT: Fever.   HISTORY OF PRESENT ILLNESS: This is a 64 year old man who has been having fever of 3 days now, spiking temperatures up to 102.  He has been disoriented.  This morning he thought his glasses was his phone. It took him 15 minutes to get out of bed, to get to the bathroom and then it took him 10 minutes to get off the toilet.  He has been very weak.  He has been staggering over the past 3 months. His speech has been slurred.  His thinking is not right.  He has been thinking a lot about his childhood.  He has been having chills with a fever this morning. He took some ibuprofen.  He is here lying in bed.  Family doing most of the history at this point since he is pretty lethargic.  In the ER, he was initially found to be hypotensive, acute renal failure, borderline troponin. Urine was positive for leukocyte esterase.  Hospitalist services were contacted for further evaluation for sepsis. The patient's white count was up and febrile.   PAST MEDICAL HISTORY: Coronary artery disease, hypertension, diabetes, bipolar schizoaffective disorder, arthritis, either a neurogenic bladder versus benign prostatic hypertrophy, has been doing self-catheterizations for the past 2 weeks. He has been using new equipment each time he does it.   PAST SURGICAL HISTORY: Stents in the heart.   ALLERGIES: MORPHINE.   MEDICATIONS: Include amlodipine 10 mg daily, Wellbutrin 300 mg daily, Coreg 25 mg twice a day, Plavix 75 mg daily, doxepin 25 mg 2 capsules at bedtime. Equetro 300 mg extended release twice a day, finasteride 5 mg daily, gabapentin 300 mg 3 times a day, lisinopril 40 mg daily, lorazepam 1 mg in the morning, 2 mg at bedtime, metformin 500 mg twice a day, omeprazole 40 mg twice a day, Protonix 40 mg daily,  Ranexa 1000 mg twice a day, Flomax 0.4 mg daily, temazepam 15 mg at bedtime, topiramate 100 mg at 12 noon, trazodone 50 mg daily vitamin D3 at 200 international units daily.   SOCIAL HISTORY: Quit smoking 2001; did smoke 2 to 3 packs per day since early age, maybe 63.  Recovering alcoholic; quit drinking in 2001. No drug use. Was a truck driver.   FAMILY HISTORY:  Mother died of a CVA. Father died at 31, unknown medical cause.   REVIEW OF SYSTEMS:  CONSTITUTIONAL: Positive for fever, positive for chills, positive for fatigue.  EYES: Does wear glasses.  EARS, NOSE, MOUTH AND THROAT: Positive for dysphagia to liquids.  CARDIOVASCULAR: No chest pain. No palpitations.  RESPIRATORY: No shortness of breath. No cough.  GASTROINTESTINAL: No nausea. No vomiting. No abdominal pain. No diarrhea. No constipation. No bright red blood per rectum. No melena.  GENITOURINARY: Has been doing self-catheterizations in the past 2 weeks.  MUSCULOSKELETAL: Positive for knee pain and knees giving out.  PSYCHIATRIC: On medication for bipolar and schizoaffective disorder.  ENDOCRINE: No thyroid problems.  HEMATOLOGIC AND LYMPHATIC: No anemia. No easy bruising or bleeding.   PHYSICAL EXAMINATION:  VITAL SIGNS: On presentation to the ER included a blood pressure on presentation of 91/57, pulse oximetry 97% on room air, respirations 16, temperature went up to 100.7, pulse 76.  GENERAL: No respiratory distress.  EYES: Conjunctivae and lids normal. Pupils equal, round,  and reactive to light. Extraocular muscles intact. No nystagmus.  EARS, NOSE, MOUTH AND THROAT: Tympanic membranes: No erythema. Nasal mucosa: No erythema. Throat:  No erythema, no exudate seen. Lips and gums: No lesions.  NECK: No JVD. No bruits. No lymphadenopathy. No thyromegaly. No thyroid nodules palpated.  LUNGS: Clear to auscultation. No use of accessory muscles to breathe. No rhonchi, rales, or wheeze heard.  CARDIOVASCULAR: S1, S2 normal. No  gallops, rubs, or murmurs heard. Carotid upstroke 2+ bilaterally. No bruits. Dorsalis pedis pulses 2+ bilaterally. No edema of the lower extremity.  ABDOMEN: Soft, nontender. No organosplenomegaly. Normoactive bowel sounds. No masses felt.  LYMPHATIC: No lymph nodes in the neck.  MUSCULOSKELETAL: No clubbing, edema, or cyanosis.  SKIN: No ulcers or lesions.  NEUROLOGIC: Cranial nerves II through XII grossly intact. Deep tendon reflexes 1+ bilateral lower extremities.  PSYCHIATRIC: The patient is alert, oriented to person and place, some confusion with answers and lethargy.   LABORATORY AND RADIOLOGICAL DATA: EKG showed normal sinus rhythm, right bundle branch block, left anterior fascicular block, left ventricular hypertrophy. Chest x-ray; No acute abnormality. ABG showed a pH of 7.38, pCO2 of 38, pO2 of 65, oxygen saturation 21%, bicarbonate 22.5.  Ammonia 28, acetaminophen less than 2.  CPK 51, glucose 122, BUN 26, creatinine 1.72, sodium 136, potassium 4.0, chloride 104, CO2 of 24, calcium 8.6.  Liver function tests normal range. Ethanol less than 3. White blood cell count 13.0, hemoglobin and hematocrit 12.8 and 33.0, INR 1.1, salicylates less than 1.7. Troponin borderline at 0.13. TSH 1.24.  Urinalysis positive for 3+ leukocyte esterase.  Urine toxicology positive for MDMA and tricyclic antidepressants.   ASSESSMENT AND PLAN:  1.  Severe sepsis with transient hypotension, acute renal failure, acute encephalopathy, fever, leukocytosis. This is likely an acute cystitis with his risk factor of having self-catheterizations for the past 2 weeks. A Foley catheter was placed in the ER.  We will keep that in for right now while the patient does have some confusion until he is able to do self-catheterizations. We will give Rocephin 1 gram IV every 12 hours. Follow up urine culture and blood cultures.  2.  Elevated troponin. I believe this is demand ischemia with sepsis and acute renal failure. The patient is  not having any chest pain or shortness of breath. The patient is on Plavix, need to hold beta blocker at this time secondary to relative hypotension.  3.  Hypotension. Hold blood pressure medications.  4.  Acute renal failure. We will give IV fluid hydration. Hold metformin and anything that can affect the kidney function.  5.  Acute encephalopathy, likely secondary to sepsis.  6.  Weakness likely secondary to sepsis. The wife would like to get an MRI done on this hospitalization because the patient has been staggering within the last 3 months and had slurred speech.  7.  History of coronary artery disease, unable to give beta blocker with hypotension; continue the Plavix.  8.  Gastroesophageal reflux disease on omeprazole and Protonix at home. We will just continue Protonix here.  9.  Diabetes. We will give sliding scale, hold Glucophage. Glucophage contraindicated with acute renal failure.  10. Bipolar disorder; will continue some of psychiatric medications. Will hold off on gabapentin for right now and the Restoril and doxepin. Other psychiatric medications will continue.  11. Either neurogenic bladder versus benign prostatic hypertrophy. Continue benign prostatic hypertrophy medications.  We will leave the Foley in for right now. Continue self-catheterizations once the Foley is  out.  12. Anemia. Will likely get worse with hydration; continue to monitor.    TIME SPENT ON ADMISSION:  55 minutes.  CODE STATUS: FULL CODE.    ____________________________ Tana Conch. Leslye Peer, MD rjw:DT D: 01/30/2014 10:51:27 ET T: 01/30/2014 11:17:42 ET JOB#: [id number]  cc: Tana Conch. Leslye Peer, MD, <Dictator> Isla Pence, MD Mertie Clause Fletcher Anon, MD  Marisue Brooklyn MD ELECTRONICALLY SIGNED 02/06/2014 1:37

## 2014-07-19 NOTE — Discharge Summary (Signed)
Dates of Admission and Diagnosis:  Date of Admission 11-Feb-2014   Date of Discharge 13-Feb-2014   Admitting Diagnosis gneralized weakness.   Final Diagnosis Generalized weakness Diabetes Stroke in past elevated troponin.    Chief Complaint/History of Present Illness a 64 year old Caucasian male with multiple medical problems, was recently admitted to the hospital on November 5th and discharged with a discharge diagnosis severe sepsis and UTI. The patient was discharged home. During that admission, he had elevated troponin which seemed to be from demand ischemia. Today, the patient is brought into the ED by wife as the patient was having generalized weakness associated with frequent falls since he was discharged from the hospital. His wife notified the primary care physician, Dr. Rosario Jacks, who sent this patient to the ED for further evaluation. The patient was evaluated by the ED physician. Troponin is still elevated at 0.12 which seems to be chronically elevated. The patient is asymptomatic, denies any chest pain or shortness of breath. He is just complaining of generalized weakness and his legs are giving away. As there is a concern for frequent falls, hospitalist team is called to admit the patient under observation status for possible treatment. The patient is resting comfortably during my examination with no complaints. Wife is not at bedside.   Allergies:  Synalgos DC: Itching  Morphine: N/V/Diarrhea  Pertinent Past History:  Pertinent Past History GERD, Diabetes mellitus type 2, bipolar disorder, chronic anemia, history of stroke, coronary artery disease, schizoaffective disorder, neurogenic bladder versus benign prostatic hypertrophy, doing self-catheterization for the past 2 weeks.   Hospital Course:  Hospital Course A 64 year old Caucasian male who was just recently admitted to the hospital on November 5th and was discharged on November 10 with a diagnosis of sepsis from urinary  tract infection, elevated troponin from demand ischemia. He went home, but feeling extremely weak and having frequent falls. Wife brought him into the ED as he is falling frequently with generalized weakness.   1.  Generalized weakness with frequent falls from recent acute sepsis. telemetry.   negative CT head to r/o new stroke, as had old stroke.    PT consult appreciated- suggest Rehab placement.     CSW working on that. likely d/c today. 2.  Chronically elevated troponin. The patient is asymptomatic. monitor him on telemetry. The patient seemed to have chronically elevated troponin as a previous troponin was also chronically elevated.    no further work ups. 3.  Gastroesophageal reflux disease.  continue Pepcid.  4.  Diabetes mellitus. on sliding scale insulin.  5.  Chronic anemia. Hemoglobin is stable at this time. No bleeding or hematemesis or melena are noticed. Monitor hemoglobin and hematocrit closely. 6.  Chronic history of cerebrovascular accident with weakness.  continue Plavix. 7.  History of neurogenic bladder versus benign prostatic hypertrophy. Continue tamsulosin if needed. The patient considering self-catheterization.   Condition on Discharge Stable   DISCHARGE INSTRUCTIONS HOME MEDS:  Medication Reconciliation: Patient's Home Medications at Discharge:     Medication Instructions  metformin 500 mg oral tablet  1 tab(s) orally 2 times a day   carvedilol 25 mg oral tablet  1 tab(s) orally 2 times a day   ranexa 1000 mg oral tablet, extended release  1 tab(s) orally 2 times a day   topiramate 100 mg oral tablet  1 tab(s) orally once a day at 12 pm   trazodone 50 mg oral tablet  1 tab(s) orally once a day (at bedtime)   gabapentin 300 mg oral capsule  1 cap(s) orally 3 times a day   equetro 300 mg oral capsule, extended release  1 cap(s) orally 2 times a day   amlodipine 10 mg oral tablet  1 tab(s) orally once a day (in the morning)   bupropion 300 mg/24 hours oral tablet,  extended release  1 tab(s) orally once a day (at bedtime)   clopidogrel 75 mg oral tablet  1 tab(s) orally once a day (in the morning)   doxepin 25 mg oral capsule  1 cap(s) orally once a day (at bedtime)   finasteride 5 mg oral tablet  1 tab(s) orally once a day (in the morning)   lisinopril 40 mg oral tablet  1 tab(s) orally once a day (in the morning)   lorazepam 1 mg oral tablet  1 tab(s) orally 2 times a day   pantoprazole 40 mg oral delayed release tablet  1 tab(s) orally once a day (in the morning)   tamsulosin 0.4 mg oral capsule  1 cap(s) orally once a day (in the morning)   temazepam 30 mg oral capsule  1 cap(s) orally once a day (at bedtime)   vitamin d3 2000 intl units oral tablet  1 tab(s) orally once a day (in the morning)   acetaminophen-hydrocodone 325 mg-5 mg oral tablet  1 tab(s) orally every 6 hours, As Needed - for Pain    PRESCRIPTIONS: ELECTRONICALLY SUBMITTED   Physician's Instructions:  Diet Low Sodium  Carbohydrate Controlled (ADA) Diet   Dietary Supplements Ensure   Dietary Supplements Frequency Two times per day   Diet Consistency Regular Consistency   Activity Limitations As tolerated   Return to Work Not Applicable   Time frame for Follow Up Appointment 1-2 weeks  PMD     Rosario Jacks, Fayegh(Family Physician): [facility], [address] [place], [place] [address], [phone]  Electronic Signatures: Vaughan Basta (MD)  (Signed [phone] 08:54)  Authored: ADMISSION DATE AND DIAGNOSIS, CHIEF COMPLAINT/HPI, Allergies, PERTINENT PAST HISTORY, HOSPITAL COURSE, Speed, PATIENT INSTRUCTIONS, Follow Up Physician   Last Updated: 20-Nov-15 08:54 by Vaughan Basta (MD)

## 2014-07-19 NOTE — Discharge Summary (Signed)
PATIENT NAME:  Roger Pruitt, Roger Pruitt MR#:  [id number] DATE OF BIRTH:  [birth year]  DATE OF ADMISSION:  01/30/2014 DATE OF DISCHARGE:  02/04/2014  PRESENTING COMPLAINT: Fever.   DISCHARGE DIAGNOSES:  1.  Severe sepsis due to urinary tract infection.  2.  Transient hypotension due to sepsis.  3.  Urinary tract infection.  4.  Elevated troponin due to demand ischemia.  5.  Acute renal failure, resolved.  6.  Acute encephalopathy, resolved.  7.  Generalized weakness.  8.  History of coronary artery disease.  9.  Gastroesophageal reflux disease.  10.  Diabetes mellitus type 2.  11.  Bipolar disorder.  12.  Neurogenic bladder versus benign prostatic hypertrophy.  13.  Chronic anemia.  14.  History of cerebrovascular accident seen for the first time on CT, thought to have occurred sometime in 2014 or 2015, not acute, no specific residual defects.   CONSULTATIONS: None.   PROCEDURES:  1.  Chest x-ray November 5th, no acute abnormality.  2.  CT of the head without contrast November 7th shows no acute intracranial findings. Bilateral lacunar infarcts which are new from 2004 but appear chronic. Generalized atrophy.  3.  Bilateral carotid Dopplers show no hemodynamically significant stenosis within bilateral internal carotid arteries.   HISTORY OF PRESENT ILLNESS: This very pleasant 64 year old man presents after 3 days of fever, temperature spiking to 102. He has been experiencing weakness and disorientation. Urine positive for leukocyte esterase. He does have urinary retention and has been needing to in and out catheterize every 4 hours at home. In the Emergency Room, he is found to be hypotensive, in acute renal failure with a borderline elevated troponin.   HOSPITAL COURSE BY PROBLEM:  1.  Severe sepsis due to urinary tract infection: This is likely due to in and out catheterization with poor technique. Urine cultures have grown pseudomonas and enterococcus, both sensitive to Levaquin. He is discharged  on oral Levaquin. Blood pressure improved with IV hydration. Blood culture were negative. Mental status improved, encephalopathy on presentation likely due to sepsis, resolved.  2.  Neurogenic bladder versus benign prostatic hypertrophy: The patient continues to require in and out catheterization. He will need to follow up with his urologist to discuss proper technique, which was demonstrated in hospital on several occasions.  3.  History of CVA: No specific residual defects. He does seem to have some progressive decrease in cognitive function per his wife. He is already on Plavix and aspirin at home. Blood pressures have supposedly been well controlled. He has had a lot of difficulty with blood pressure control. His wife thinks that he does not take his blood pressure medications reliably. Even in hospital, his blood pressure was fairly elevated once sepsis resolved. Blood pressure was controlled on home regimen and is controlled at the time of discharge.  4.  Hypertension: Resume home medications.  5.  Elevated troponin: Likely due to demand ischemia. There was no chest pain or shortness of breath. No EKG changes. He will continue on Plavix, beta blocker and statin therapy.  6.  Acute renal failure, which was due to sepsis and hypotension, acute tubular necrosis: Resolved with IV hydration. 7.  Generalized weakness: The patient was assessed by physical therapy who initially recommended skilled nursing facility and this was arranged. However, he recovered well and by the time of discharge he is actually a candidate for in-home physical therapy. He will be discharged with continued in-home physical therapy.  8.  Diabetes mellitus type 2: Controlled in  the hospital with sliding scale insulin.  9.  Bipolar disorder: Stable throughout hospitalization.   DISCHARGE PHYSICAL EXAMINATION:  VITAL SIGNS: Temperature 97.5, heart rate 67, respirations 15, blood pressure 114/77, oxygenation 95% on room air.   GENERAL: No acute distress.  CARDIOVASCULAR: Regular rate and rhythm. No murmurs, rubs or gallops.  PULMONARY: Clear to auscultation bilaterally with good air movement.  PSYCHIATRIC: The patient is alert and oriented x 4. Does have good insight into his clinical condition. May exhibit poor judgment. No signs of uncontrolled anxiety or depression.   LABORATORY DATA: Sodium 137, potassium 3.8, chloride 104, bicarb 25, BUN 16, creatinine 0.86. GFR is greater than 60. White blood cells 6.8, hemoglobin 11.5, platelets 174,000, MCV 93. Urine culture shows pseudomonas and enterococcus, both sensitive to Levaquin. Blood cultures negative, possible contaminant in one of his blood cultures. Not likely a true bacteremia.   DISCHARGE MEDICATIONS:  1.  Protonix 40 mg 1 tablet daily.  2.  Clopidogrel 75 mg 1 tablet daily.  3.  Tamsulosin 0.4 mg 1 tablet daily.  4.  Metformin 500 mg 1 tablet twice a day.  5.  Amlodipine 10 mg 1 tablet daily.  6.  Lorazepam 1 mg 2 tablets once a day at bedtime.  7.  Carvedilol 25 mg 1 tablet twice a day.  8.  Doxepin 25 mg 2 capsules once a day at bedtime.  9.  Lisinopril 40 mg 1 tablet daily.  10.  Ranexa 1000 mg oral tablet extended release 1 tablet 2 times a day.  11.  Topiramate 100 mg 1 tablet once a day at 12:00 p.m.  12.  Vitamin D3 at 2000 international units 1 tablet once a day.  13.  Trazodone 50 mg 1 tablet once a day.  14.  Gabapentin 300 mg 1 capsule 3 times a day.  15.  Bupropion 300 mg 24 hour oral tablet extended release 1 tablet once a day.  16.  Equetro 300 mg 1 capsule twice a day.  17.  Finasteride 5 mg 1 tablet once a day.  18.  Temazepam 15 mg 2 capsules once a day.  19.  Invega Sustenna 234 mg/1.5 mL intramuscular suspension 1 dose intramuscularly every 4 weeks.  20.  Levofloxacin 750 mg 1 tablet every 24 hours; stop after last dose on November 12th.  21.  Lorazepam 1 mg 1 tablet once a day in the morning.   CONDITION ON DISCHARGE: Stable.    DISCHARGE DISPOSITION: Discharge to home with home health physical therapy, nursing, social work and Doctor, hospital.   DISCHARGE INSTRUCTIONS:  DIET: Low-fat, low-cholesterol carbohydrate-controlled, ADA diet.   ACTIVITY LIMITATIONS: None.   TIMEFRAME FOR FOLLOWUP: Within 1-2 weeks with urology and 1-2 weeks with primary care.   TIME SPENT ON DISCHARGE: Forty-five minutes.    ____________________________ Earleen Newport. Volanda Napoleon, MD cpw:TT D: 02/06/2014 14:51:20 ET T: 02/06/2014 18:15:10 ET JOB#: [id number]  cc: Barnetta Chapel P. Volanda Napoleon, MD, <Dictator> Aldean Jewett MD ELECTRONICALLY SIGNED 02/07/2014 11:49

## 2014-07-20 NOTE — Consult Note (Signed)
Brief Consult Note: Comments: 64 yr old male with h/o bipolar disroder who has been on seroquel xr 400 mg for last 2days brought in by wife for AMS":AMS:likely due to seroquel,his CT head,ABg.Ammonia.cbc.BMP are wnl,not suyspecting medical  reason for AMS,recommend psych eval,off seroquel 2.:urine retention: likely from [place]  wth DR.Yves Dill (saw him before for urtheral stricture)recommended to discharge him with foley(when he  is ready),and ad flomax,follow up with him in one week, because ot foley was able to pass thru without any problem he said it is not recurrence of stricture. 3.htn/h/o cad:no chest pain:continue his ASA,bblocker,statin and plavix,ace will follow.  Electronic Signatures: Epifanio Lesches (MD)  (Signed 13-Jun-13 15:38)  Authored: Brief Consult Note   Last Updated: 13-Jun-13 15:38 by Epifanio Lesches (MD)
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
## 2014-07-20 NOTE — Consult Note (Signed)
PATIENT NAME:  Roger Pruitt, Roger Pruitt MR#:  [id number] DATE OF BIRTH:  1950/12/21  DATE OF CONSULTATION:  09/08/2011  REFERRING PHYSICIAN:   CONSULTING PHYSICIAN:  Epifanio Lesches, MD  ADDENDUM  Patient has urinary retention. I spoke with Dr. Yves Dill who is his urologist. He recommended patient can go home with Foley to the leg bag and he will see him in the office in one week and he also recommended to start him on Flomax. I am going to start him on Flomax and give appointment for patient to follow with Dr. Yves Dill in one week and he will start on Flomax as well. There is no acute need to see the patient in the hospital because his kidney function normal and he has gotten the Foley without any problems so urethral stricture is unlikely so he will follow up with him as an outpatient.   ____________________________ Epifanio Lesches, MD sk:cms D: 09/08/2011 15:36:47 ET T: 09/09/2011 07:00:27 ET JOB#: [id number]  cc: Epifanio Lesches, MD, <Dictator> Epifanio Lesches MD ELECTRONICALLY SIGNED 09/20/2011 0:02
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
## 2014-07-20 NOTE — Consult Note (Signed)
Brief Consult Note: Diagnosis: schizophrenia.   Patient was seen by consultant.   Consult note dictated.   Discussed with Attending MD.   Comments: Psychiatry: Patient seen. Chart reviewed. 64 year old man came in due to subacute difficulty talking and inability to stand up steadily. Reports recent change in medication in being placed on seroquel 400mg  at night and topamax 100mg  at night starting a few days ago. This, however, is the same medication combination he has been on before.  Now patient is alert and oriented and denies current AH or SI or HI. I could not make him stand due to urinary  catheter. Patient mentally at baseline. No need for psych admit. Unclear why he was symptomatic or why he cleared up. Suggest he skip seroquel tonight but contact Dr A tomorrow. He agrees.  Electronic Signatures: Gonzella Lex (MD)  (Signed 13-Jun-13 17:18)  Authored: Brief Consult Note   Last Updated: 13-Jun-13 17:18 by Gonzella Lex (MD)

## 2014-07-20 NOTE — Discharge Summary (Signed)
PATIENT NAME:  Roger Pruitt, Roger Pruitt MR#:  [id number] DATE OF BIRTH:  04-01-1950  DATE OF ADMISSION:  06/25/2011 DATE OF SIGNING OUT AGAINST MEDICAL ADVICE:  06/25/2011  PRIMARY CARE PHYSICIAN: Dr. Heber Tilden  PSYCHIATRIST: Dr. Bernita Raisin   CHIEF COMPLAINT: Patient was brought in secondary to unresponsiveness.   HISTORY OF PRESENT ILLNESS: 64 year old man with history of bipolar disorder, hypertension, coronary artery disease, sleep apnea, neuropathy, chronic pain was brought in with unresponsiveness and found to have a possible urinary tract infection. His unresponsiveness was thought to be secondary to taking an extra day of medications that he has been on, quite a few medications that he was not previously on in the jail. He was admitted to the medical floor. He did wake up later and signed out [place]. As per the wife he not suicidal. He was doing well prior to coming in. Actually painted the house prior to coming in today. When I saw him he was able to answer some questions but fell back asleep. I was called back by the nurse later that day that he woke up and we did start him on a diet. For his urinary tract infection we started him on Rocephin. He did have a respiratory acidosis most likely secondary to over sedation with medications. For his hypokalemia we did give him a potassium bolus. Of note troponins were done and were borderline at 0.09 and 0.11. The patient does have a history of coronary artery disease and does take Plavix, Imdur Toprol-XL and Lipitor for cardioprotection. The patient did not have any complaints of chest pain as per wife and patient. Patient signed out [place] on the evening of the 30th.   ____________________________ Tana Conch. Leslye Peer, MD rjw:cms D: 07/04/2011 13:04:19 ET T: 07/05/2011 10:12:08 ET JOB#: [id number]  cc: Tana Conch. Leslye Peer, MD, <Dictator> Maureen P. Heber [place], MD Alexander Bergeron, MD Marisue Brooklyn MD ELECTRONICALLY SIGNED  07/08/2011 16:20

## 2014-07-27 NOTE — Op Note (Signed)
PATIENT NAME:  Roger Pruitt, Roger Pruitt MR#:  [id number] DATE OF BIRTH:  Oct 27, 1950  DATE OF PROCEDURE:  03/31/2014  PREOPERATIVE DIAGNOSIS: Vesical outlet obstruction secondary to benign prostatic hypertrophy with urinary retention.  POSTOPERATIVE DIAGNOSIS: Vesical outlet obstruction secondary to benign prostatic hypertrophy with urinary retention.  PROCEDURE: KPT laser of the prostate.   ANESTHESIA: General.  SURGEON: Richard D. Elnoria Howard, DO.   DESCRIPTION OF PROCEDURE: With the patient sterilely prepped and draped and in the supine lithotomy position for ease of approach to the external genitalia and after an appropriate timeout witnessed by all parties, we begin the procedure. A 22-French laser panendoscope was introduced. The bladder was viewed. There are no tumors, masses, growths, The bladder has good volume with minimal trabeculation present. No saccular diverticula or stones or tumors are present. I began the KTP laser prostatectomy and noted a very high bladder neck sweeping laterally with the KPT laser use utilizing the verumontanum as the end point for laser energy. So we go from the bladder neck to the verumontanum which is about 3 cm. It is not a long distance. The lateral lobes are present, and they are swept away. It is very, very tight with the lateral lobes and the high bladder neck present. So we sweep laterally until I have enough space that I can see the beam projecting out of the end of the KPT laser. Then with this beam at 80 joules and then maximum utilizing 80 watts  to 120 watts and 102,000 joules, we lasered for 17 minutes on his prostate and significant opening, The patient tolerated the procedure well. There is minimal bleeding. I had 2 small oozing bleeds that were disintegrated with the laser. Calculi are seen toward the end of the procedure so I know I am right at the edge of the prostatic tissue. The bladder is left filled, the laser removed and the scope removed. There is flow. I have  inserted a 22-French Foley catheter so I can send the patient home safely. He tolerates this well. He was sent to recovery in satisfactory condition with 30 mL of Marcaine in his bladder and a B and O suppository in his rectum. Rectal exam reveals no masses, tumors, or growths.   ____________________________ Janice Coffin. Elnoria Howard, DO rdh:jh D: 03/31/2014 10:25:40 ET T: 03/31/2014 13:11:15 ET JOB#: [id number]  cc: Janice Coffin. Elnoria Howard, DO, <Dictator> RICHARD D HART DO ELECTRONICALLY SIGNED 04/04/2014 14:42

## 2014-07-29 ENCOUNTER — Other Ambulatory Visit: Payer: Self-pay | Admitting: Cardiovascular Disease

## 2014-08-23 ENCOUNTER — Emergency Department: Payer: Medicare Other

## 2014-08-23 ENCOUNTER — Observation Stay: Payer: Medicare Other

## 2014-08-23 ENCOUNTER — Observation Stay (HOSPITAL_BASED_OUTPATIENT_CLINIC_OR_DEPARTMENT_OTHER)
Admit: 2014-08-23 | Discharge: 2014-08-23 | Disposition: A | Discharge status: Home / Self Care | Payer: Medicare Other | Attending: Internal Medicine | Admitting: Internal Medicine

## 2014-08-23 ENCOUNTER — Other Ambulatory Visit: Payer: Self-pay

## 2014-08-23 ENCOUNTER — Observation Stay
Admission: EM | Admit: 2014-08-23 | Discharge: 2014-08-24 | Disposition: A | Discharge status: Home / Self Care | Payer: Medicare Other | Attending: Internal Medicine | Admitting: Internal Medicine

## 2014-08-23 DIAGNOSIS — Z8673 Personal history of transient ischemic attack (TIA), and cerebral infarction without residual deficits: Secondary | ICD-10-CM | POA: Insufficient documentation

## 2014-08-23 DIAGNOSIS — E119 Type 2 diabetes mellitus without complications: Secondary | ICD-10-CM | POA: Insufficient documentation

## 2014-08-23 DIAGNOSIS — K219 Gastro-esophageal reflux disease without esophagitis: Secondary | ICD-10-CM | POA: Diagnosis not present

## 2014-08-23 DIAGNOSIS — I2510 Atherosclerotic heart disease of native coronary artery without angina pectoris: Secondary | ICD-10-CM | POA: Insufficient documentation

## 2014-08-23 DIAGNOSIS — I10 Essential (primary) hypertension: Secondary | ICD-10-CM | POA: Insufficient documentation

## 2014-08-23 DIAGNOSIS — R4781 Slurred speech: Secondary | ICD-10-CM

## 2014-08-23 DIAGNOSIS — Z79899 Other long term (current) drug therapy: Secondary | ICD-10-CM | POA: Insufficient documentation

## 2014-08-23 DIAGNOSIS — R531 Weakness: Secondary | ICD-10-CM | POA: Diagnosis not present

## 2014-08-23 DIAGNOSIS — R079 Chest pain, unspecified: Secondary | ICD-10-CM | POA: Diagnosis present

## 2014-08-23 DIAGNOSIS — D72829 Elevated white blood cell count, unspecified: Secondary | ICD-10-CM | POA: Diagnosis present

## 2014-08-23 DIAGNOSIS — Z8249 Family history of ischemic heart disease and other diseases of the circulatory system: Secondary | ICD-10-CM | POA: Insufficient documentation

## 2014-08-23 DIAGNOSIS — E559 Vitamin D deficiency, unspecified: Secondary | ICD-10-CM | POA: Insufficient documentation

## 2014-08-23 DIAGNOSIS — Z955 Presence of coronary angioplasty implant and graft: Secondary | ICD-10-CM | POA: Insufficient documentation

## 2014-08-23 DIAGNOSIS — F42 Obsessive-compulsive disorder: Secondary | ICD-10-CM | POA: Diagnosis not present

## 2014-08-23 DIAGNOSIS — E785 Hyperlipidemia, unspecified: Secondary | ICD-10-CM | POA: Diagnosis not present

## 2014-08-23 DIAGNOSIS — M5136 Other intervertebral disc degeneration, lumbar region: Secondary | ICD-10-CM | POA: Insufficient documentation

## 2014-08-23 DIAGNOSIS — I639 Cerebral infarction, unspecified: Principal | ICD-10-CM | POA: Diagnosis present

## 2014-08-23 DIAGNOSIS — R410 Disorientation, unspecified: Secondary | ICD-10-CM | POA: Insufficient documentation

## 2014-08-23 DIAGNOSIS — I69328 Other speech and language deficits following cerebral infarction: Secondary | ICD-10-CM | POA: Insufficient documentation

## 2014-08-23 DIAGNOSIS — G629 Polyneuropathy, unspecified: Secondary | ICD-10-CM | POA: Insufficient documentation

## 2014-08-23 DIAGNOSIS — Z886 Allergy status to analgesic agent status: Secondary | ICD-10-CM | POA: Insufficient documentation

## 2014-08-23 DIAGNOSIS — F319 Bipolar disorder, unspecified: Secondary | ICD-10-CM | POA: Diagnosis not present

## 2014-08-23 DIAGNOSIS — F410 Panic disorder [episodic paroxysmal anxiety] without agoraphobia: Secondary | ICD-10-CM | POA: Diagnosis not present

## 2014-08-23 DIAGNOSIS — N400 Enlarged prostate without lower urinary tract symptoms: Secondary | ICD-10-CM | POA: Insufficient documentation

## 2014-08-23 DIAGNOSIS — M1380 Other specified arthritis, unspecified site: Secondary | ICD-10-CM | POA: Diagnosis not present

## 2014-08-23 DIAGNOSIS — I739 Peripheral vascular disease, unspecified: Secondary | ICD-10-CM | POA: Insufficient documentation

## 2014-08-23 DIAGNOSIS — I252 Old myocardial infarction: Secondary | ICD-10-CM | POA: Insufficient documentation

## 2014-08-23 DIAGNOSIS — I209 Angina pectoris, unspecified: Secondary | ICD-10-CM | POA: Insufficient documentation

## 2014-08-23 DIAGNOSIS — Z885 Allergy status to narcotic agent status: Secondary | ICD-10-CM | POA: Diagnosis not present

## 2014-08-23 DIAGNOSIS — F329 Major depressive disorder, single episode, unspecified: Secondary | ICD-10-CM | POA: Insufficient documentation

## 2014-08-23 DIAGNOSIS — R0609 Other forms of dyspnea: Secondary | ICD-10-CM | POA: Diagnosis not present

## 2014-08-23 DIAGNOSIS — G4730 Sleep apnea, unspecified: Secondary | ICD-10-CM | POA: Insufficient documentation

## 2014-08-23 DIAGNOSIS — Z87440 Personal history of urinary (tract) infections: Secondary | ICD-10-CM | POA: Diagnosis not present

## 2014-08-23 DIAGNOSIS — Z87891 Personal history of nicotine dependence: Secondary | ICD-10-CM | POA: Diagnosis not present

## 2014-08-23 DIAGNOSIS — G9340 Encephalopathy, unspecified: Secondary | ICD-10-CM | POA: Diagnosis present

## 2014-08-23 DIAGNOSIS — E876 Hypokalemia: Secondary | ICD-10-CM | POA: Diagnosis not present

## 2014-08-23 LAB — CBC WITH DIFFERENTIAL/PLATELET
BASOS PCT: 0 %
Basophils Absolute: 0.1 10*3/uL (ref 0–0.1)
EOS ABS: 0 10*3/uL (ref 0–0.7)
Eosinophils Relative: 0 %
HEMATOCRIT: 32.8 % — AB (ref 40.0–52.0)
Hemoglobin: 11.1 g/dL — ABNORMAL LOW (ref 13.0–18.0)
LYMPHS ABS: 1 10*3/uL (ref 1.0–3.6)
LYMPHS PCT: 8 %
MCH: 31 pg (ref 26.0–34.0)
MCHC: 34 g/dL (ref 32.0–36.0)
MCV: 91.3 fL (ref 80.0–100.0)
Monocytes Absolute: 0.9 10*3/uL (ref 0.2–1.0)
Monocytes Relative: 7 %
Neutro Abs: 10.1 10*3/uL — ABNORMAL HIGH (ref 1.4–6.5)
Neutrophils Relative %: 85 %
Platelets: 173 10*3/uL (ref 150–440)
RBC: 3.59 MIL/uL — ABNORMAL LOW (ref 4.40–5.90)
RDW: 13.2 % (ref 11.5–14.5)
WBC: 12 10*3/uL — ABNORMAL HIGH (ref 3.8–10.6)

## 2014-08-23 LAB — URINALYSIS COMPLETE WITH MICROSCOPIC (ARMC ONLY)
Bilirubin Urine: NEGATIVE
Glucose, UA: NEGATIVE mg/dL
Ketones, ur: NEGATIVE mg/dL
Nitrite: NEGATIVE
Protein, ur: 30 mg/dL — AB
Specific Gravity, Urine: 1.013 (ref 1.005–1.030)
pH: 6 (ref 5.0–8.0)

## 2014-08-23 LAB — COMPREHENSIVE METABOLIC PANEL
ALT: 12 U/L — ABNORMAL LOW (ref 17–63)
AST: 18 U/L (ref 15–41)
Albumin: 3.7 g/dL (ref 3.5–5.0)
Alkaline Phosphatase: 36 U/L — ABNORMAL LOW (ref 38–126)
Anion gap: 9 (ref 5–15)
BILIRUBIN TOTAL: 0.8 mg/dL (ref 0.3–1.2)
BUN: 21 mg/dL — AB (ref 6–20)
CO2: 27 mmol/L (ref 22–32)
Calcium: 8.7 mg/dL — ABNORMAL LOW (ref 8.9–10.3)
Chloride: 100 mmol/L — ABNORMAL LOW (ref 101–111)
Creatinine, Ser: 1.06 mg/dL (ref 0.61–1.24)
GFR calc non Af Amer: >60 mL/min (ref >60)
Glucose, Bld: 137 mg/dL — ABNORMAL HIGH (ref 65–99)
POTASSIUM: 3.1 mmol/L — AB (ref 3.5–5.1)
Sodium: 136 mmol/L (ref 135–145)
TOTAL PROTEIN: 6.2 g/dL — AB (ref 6.5–8.1)

## 2014-08-23 LAB — MAGNESIUM: MAGNESIUM: 1.7 mg/dL (ref 1.7–2.4)

## 2014-08-23 LAB — TROPONIN I: Troponin I: <0.03 ng/mL (ref <0.031)

## 2014-08-23 LAB — ETHANOL: Alcohol, Ethyl (B): <5 mg/dL (ref <5)

## 2014-08-23 MED ORDER — CARVEDILOL 25 MG PO TABS
25.0000 mg | ORAL_TABLET | Freq: Two times a day (BID) | ORAL | Status: DC
  Administered 2014-08-24: 25 mg via ORAL
  Filled 2014-08-23 (×2): qty 1

## 2014-08-23 MED ORDER — HEPARIN SODIUM (PORCINE) 5000 UNIT/ML IJ SOLN: 5000.0000 [IU] | Freq: Three times a day (TID) | INTRAMUSCULAR | Status: DC

## 2014-08-23 MED ORDER — HEPARIN SODIUM (PORCINE) 5000 UNIT/ML IJ SOLN
5000.0000 [IU] | Freq: Three times a day (TID) | INTRAMUSCULAR | Status: DC
  Administered 2014-08-23 – 2014-08-24 (×3): 5000 [IU] via SUBCUTANEOUS
  Filled 2014-08-23 (×3): qty 1

## 2014-08-23 MED ORDER — DOXEPIN HCL 25 MG PO CAPS
50.0000 mg | ORAL_CAPSULE | Freq: Every day | ORAL | Status: DC
Start: 2014-08-23 — End: 2014-08-24
  Administered 2014-08-23: 50 mg via ORAL
  Filled 2014-08-23: qty 2
  Filled 2014-08-23: qty 1

## 2014-08-23 MED ORDER — TAMSULOSIN HCL 0.4 MG PO CAPS
0.4000 mg | ORAL_CAPSULE | Freq: Every day | ORAL | Status: DC
  Administered 2014-08-24: 0.4 mg via ORAL
  Filled 2014-08-23: qty 1

## 2014-08-23 MED ORDER — GABAPENTIN 300 MG PO CAPS
300.0000 mg | ORAL_CAPSULE | Freq: Three times a day (TID) | ORAL | Status: DC
  Administered 2014-08-23 – 2014-08-24 (×3): 300 mg via ORAL
  Filled 2014-08-23 (×3): qty 1

## 2014-08-23 MED ORDER — ATORVASTATIN CALCIUM 20 MG PO TABS: 40.0000 mg | ORAL_TABLET | Freq: Every day | ORAL | Status: DC

## 2014-08-23 MED ORDER — CLOPIDOGREL BISULFATE 75 MG PO TABS
75.0000 mg | ORAL_TABLET | Freq: Every day | ORAL | Status: DC
  Administered 2014-08-24: 75 mg via ORAL
  Filled 2014-08-23: qty 1

## 2014-08-23 MED ORDER — CHLORTHALIDONE 25 MG PO TABS
25.0000 mg | ORAL_TABLET | Freq: Every morning | ORAL | Status: DC
  Administered 2014-08-24: 25 mg via ORAL
  Filled 2014-08-23: qty 1

## 2014-08-23 MED ORDER — LISINOPRIL 20 MG PO TABS
40.0000 mg | ORAL_TABLET | Freq: Every day | ORAL | Status: DC
  Administered 2014-08-24: 40 mg via ORAL
  Filled 2014-08-23: qty 2

## 2014-08-23 MED ORDER — HYDROCHLOROTHIAZIDE 25 MG PO TABS
25.0000 mg | ORAL_TABLET | Freq: Every morning | ORAL | Status: DC
  Administered 2014-08-24: 25 mg via ORAL
  Filled 2014-08-23: qty 1

## 2014-08-23 MED ORDER — CLOPIDOGREL BISULFATE 75 MG PO TABS
75.0000 mg | ORAL_TABLET | Freq: Once | ORAL | Status: AC
  Administered 2014-08-23: 75 mg via ORAL
  Filled 2014-08-23: qty 1

## 2014-08-23 MED ORDER — STROKE: EARLY STAGES OF RECOVERY BOOK
Freq: Once | Status: AC
  Administered 2014-08-23: 18:00:00

## 2014-08-23 MED ORDER — NITROGLYCERIN 0.4 MG SL SUBL: 0.4000 mg | SUBLINGUAL_TABLET | SUBLINGUAL | Status: DC | PRN

## 2014-08-23 MED ORDER — TRAZODONE HCL 50 MG PO TABS
50.0000 mg | ORAL_TABLET | Freq: Every day | ORAL | Status: DC
  Filled 2014-08-23: qty 1

## 2014-08-23 MED ORDER — POTASSIUM CHLORIDE CRYS ER 20 MEQ PO TBCR
40.0000 meq | EXTENDED_RELEASE_TABLET | Freq: Once | ORAL | Status: AC
  Administered 2014-08-23: 40 meq via ORAL
  Filled 2014-08-23: qty 2

## 2014-08-23 MED ORDER — ACETAMINOPHEN 325 MG PO TABS: 650.0000 mg | ORAL_TABLET | Freq: Four times a day (QID) | ORAL | Status: DC | PRN

## 2014-08-23 MED ORDER — SODIUM CHLORIDE 0.9 % IV SOLN
INTRAVENOUS | Status: DC
  Administered 2014-08-23 – 2014-08-24 (×2): via INTRAVENOUS

## 2014-08-23 MED ORDER — FINASTERIDE 5 MG PO TABS
5.0000 mg | ORAL_TABLET | Freq: Every morning | ORAL | Status: DC
Start: 2014-08-24 — End: 2014-08-24
  Administered 2014-08-24: 5 mg via ORAL
  Filled 2014-08-23: qty 1

## 2014-08-23 MED ORDER — ONDANSETRON HCL 4 MG PO TABS: 4.0000 mg | ORAL_TABLET | Freq: Four times a day (QID) | ORAL | Status: DC | PRN

## 2014-08-23 MED ORDER — AMLODIPINE BESYLATE 10 MG PO TABS
10.0000 mg | ORAL_TABLET | Freq: Every morning | ORAL | Status: DC
  Administered 2014-08-24: 10 mg via ORAL
  Filled 2014-08-23: qty 1

## 2014-08-23 MED ORDER — BUPROPION HCL ER (XL) 150 MG PO TB24
300.0000 mg | ORAL_TABLET | Freq: Every day | ORAL | Status: DC
  Administered 2014-08-24: 300 mg via ORAL
  Filled 2014-08-23: qty 2

## 2014-08-23 MED ORDER — ACETAMINOPHEN 650 MG RE SUPP: 650.0000 mg | Freq: Four times a day (QID) | RECTAL | Status: DC | PRN

## 2014-08-23 MED ORDER — CARBAMAZEPINE ER 300 MG PO CP12: 300.0000 mg | ORAL_CAPSULE | Freq: Three times a day (TID) | ORAL | Status: DC

## 2014-08-23 MED ORDER — TOPIRAMATE 100 MG PO TABS
100.0000 mg | ORAL_TABLET | Freq: Every day | ORAL | Status: DC
  Administered 2014-08-24: 100 mg via ORAL
  Filled 2014-08-23: qty 1

## 2014-08-23 MED ORDER — ONDANSETRON HCL 4 MG/2ML IJ SOLN: 4.0000 mg | Freq: Four times a day (QID) | INTRAMUSCULAR | Status: DC | PRN

## 2014-08-23 MED ORDER — SENNOSIDES-DOCUSATE SODIUM 8.6-50 MG PO TABS
1.0000 | ORAL_TABLET | Freq: Every evening | ORAL | Status: DC | PRN
  Administered 2014-08-23: 1 via ORAL
  Filled 2014-08-23: qty 1

## 2014-08-23 MED ORDER — LORAZEPAM 1 MG PO TABS
1.0000 mg | ORAL_TABLET | Freq: Every day | ORAL | Status: DC
  Administered 2014-08-23: 1 mg via ORAL
  Filled 2014-08-23: qty 1

## 2014-08-23 MED ORDER — RANOLAZINE ER 500 MG PO TB12
1000.0000 mg | ORAL_TABLET | Freq: Three times a day (TID) | ORAL | Status: DC
  Administered 2014-08-23 – 2014-08-24 (×3): 1000 mg via ORAL
  Filled 2014-08-23 (×3): qty 2

## 2014-08-23 MED ORDER — FUROSEMIDE 40 MG PO TABS
40.0000 mg | ORAL_TABLET | Freq: Every day | ORAL | Status: DC
  Administered 2014-08-24: 40 mg via ORAL
  Filled 2014-08-23: qty 1

## 2014-08-23 MED ORDER — CARBAMAZEPINE ER 200 MG PO TB12
300.0000 mg | ORAL_TABLET | Freq: Three times a day (TID) | ORAL | Status: DC
  Administered 2014-08-23 – 2014-08-24 (×3): 300 mg via ORAL
  Filled 2014-08-23 (×5): qty 1

## 2014-08-23 MED ORDER — ALBUTEROL SULFATE (2.5 MG/3ML) 0.083% IN NEBU: 2.5000 mg | INHALATION_SOLUTION | RESPIRATORY_TRACT | Status: DC | PRN

## 2014-08-23 NOTE — ED Notes (Signed)
Patient alert and oriented.  Speech mumbled and quiet.  Bed low position.  Rails up X 2.  Call bell in reach.  Respirations unlabored.  Skin warm, dry and pink.  Radial pulses WNL.

## 2014-08-23 NOTE — Progress Notes (Signed)
*  PRELIMINARY RESULTS* Echocardiogram 2D Echocardiogram has been performed.  Roger Pruitt 08/23/2014, 6:40 PM

## 2014-08-23 NOTE — ED Notes (Signed)
Alert and oriented. Admitted to floor.  Will transport with RN and telemetry.

## 2014-08-23 NOTE — H&P (Signed)
[facility] [place] NAME: Roger Pruitt    MR#:  [id number]  DATE OF BIRTH:  03-05-1951  DATE OF ADMISSION:  08/23/2014  PRIMARY CARE PHYSICIAN: JADALI,FAYEGH, MD   REQUESTING/REFERRING PHYSICIAN: Dr. Kerman Passey.  CHIEF COMPLAINT:   Chief Complaint  Patient presents with  . Chest Pain   slurred speech and generalized weakness for 2 days.  HISTORY OF PRESENT ILLNESS:  Roger Pruitt  is a 64 y.o. male with a known history of CAD with 2 stents, hypertension, diabetes, hyperlipidemia and TIA twice. The patient was sent to ED due to slurred speech, somnolence and generalized weakness. The patient is alert, awake and oriented in now. He said he has had a generalized weakness since yesterday. In addition, he has had a slurred speech for the past 2 days. Since the patient was noticed to have a slurred speech, weakness and somnolence, the patient's wife called ambulance. According to ED physician, the patient complained of chest pain for the past the 2 days, but the patient denies any chest pain. Patient denies any other symptoms. The patient did start a new medication Abilify 5 days ago, but his wife states symptoms did not start until yesterday. The patient does still has a slurred speech the ED.  PAST MEDICAL HISTORY:   Past Medical History  Diagnosis Date  . Diabetes mellitus     Type II  . Coronary artery disease   . Sleep apnea   . Esophageal reflux   . Disc degeneration, lumbar   . Bipolar disorder   . Hyperlipidemia   . MI (myocardial infarction) 2001;2012;2012  . Arthritis   . BPD (bronchopulmonary dysplasia)   . Panic attack   . Neuropathy   . Obsessive compulsive disorder   . Carpal tunnel syndrome   . History of bladder infections   . Unspecified vitamin D deficiency   . Other dysphagia   . Iron deficiency anemia, unspecified   . Hypertension     PAST SURGICAL HISTORY:   Past Surgical History  Procedure Laterality Date   . Tonsillectomy    . Back surgery    . Skin graft    . Carpal tunnel release  2009    left  . Cardiac catheterization  6/10;2011;Aug.2012;Oct. 2012;   . Coronary angioplasty  2011 & 2001    s/p stent  . Cardiac catheterization  05/2012    [facility]; EF 60%, patent stent in the left circumflex, moderate three-vessel disease with no flow-limiting lesions. Unchanged from most recent catheterization  . Cyst removal leg Right     SOCIAL HISTORY:   History  Substance Use Topics  . Smoking status: Former Smoker -- 15 years    Types: Cigarettes    Quit date: 05/17/2000  . Smokeless tobacco: Never Used  . Alcohol Use: No    FAMILY HISTORY:   Family History  Problem Relation Age of Onset  . Heart disease Mother   . Heart disease Brother   . Heart disease Brother     DRUG ALLERGIES:   Allergies  Allergen Reactions  . Morphine And Related Nausea And Vomiting  . Synalgos-Dc [Aspirin-Caff-Dihydrocodeine] Itching    "Felt like 1000's bee's were stinging me"    REVIEW OF SYSTEMS:  CONSTITUTIONAL: No fever or chills, but has a generalized weakness.  EYES: No blurred or double vision.  EARS, NOSE, AND THROAT: No tinnitus or ear pain.  RESPIRATORY: No cough, shortness of breath, wheezing or hemoptysis.  CARDIOVASCULAR:  No chest pain, orthopnea, edema.  GASTROINTESTINAL: No nausea, vomiting, diarrhea or abdominal pain.  GENITOURINARY: No dysuria, hematuria.  ENDOCRINE: No polyuria, nocturia,  HEMATOLOGY: No anemia, easy bruising or bleeding SKIN: No rash or lesion. MUSCULOSKELETAL: No joint pain or arthritis.   NEUROLOGIC: No syncope, loss of consciousness or seizure. No tingling, numbness, or focal weakness, but has a slurred speech. PSYCHIATRY: No anxiety or depression.   MEDICATIONS AT HOME:   Prior to Admission medications   Medication Sig Start Date End Date Taking? Authorizing Provider  ABILIFY MAINTENA 400 MG SUSR Inject 400 mg into the muscle every 30 (thirty) days.  08/11/14  Yes Historical Provider, MD  Alcohol Swabs 70 % PADS Use as directed. 08/09/14  Yes Historical Provider, MD  amLODipine (NORVASC) 10 MG tablet Take 10 mg by mouth every morning.   Yes Historical Provider, MD  buPROPion (WELLBUTRIN XL) 300 MG 24 hr tablet Take 300 mg by mouth daily.  05/16/12  Yes Historical Provider, MD  Carbamazepine (EQUETRO) 300 MG CP12 Take 300 mg by mouth 3 (three) times daily.   Yes Historical Provider, MD  carvedilol (COREG) 25 MG tablet TAKE 1 TABLET BY MOUTH TWICE DAILY 07/29/14  Yes Wellington Hampshire, MD  chlorthalidone (HYGROTON) 25 MG tablet Take 25 mg by mouth every morning.   Yes Historical Provider, MD  clopidogrel (PLAVIX) 75 MG tablet Take 75 mg by mouth daily.   Yes Historical Provider, MD  doxepin (SINEQUAN) 25 MG capsule Take 50 mg by mouth at bedtime.  12/28/12  Yes Historical Provider, MD  finasteride (PROSCAR) 5 MG tablet Take 5 mg by mouth every morning.   Yes Historical Provider, MD  furosemide (LASIX) 40 MG tablet Take 40 mg by mouth daily.  12/05/12  Yes Historical Provider, MD  gabapentin (NEURONTIN) 300 MG capsule Take 1 capsule (300 mg total) by mouth 3 (three) times daily. 02/06/13  Yes Max T Hyatt, DPM  hydrochlorothiazide (HYDRODIURIL) 25 MG tablet Take 25 mg by mouth every morning.   Yes Historical Provider, MD  lisinopril (PRINIVIL,ZESTRIL) 40 MG tablet Take 1 tablet (40 mg total) by mouth daily. 10/25/13  Yes Wellington Hampshire, MD  LORazepam (ATIVAN) 1 MG tablet Take 1 mg by mouth at bedtime.    Yes Historical Provider, MD  metFORMIN (GLUCOPHAGE) 1000 MG tablet Take 1,000 mg by mouth 2 (two) times daily with a meal.   Yes Historical Provider, MD  nitroGLYCERIN (NITROSTAT) 0.4 MG SL tablet Place 0.4 mg under the tongue every 5 (five) minutes as needed.   Yes Historical Provider, MD  [facility] DELICA LANCETS 74Y MISC 2 (two) times daily. Use as directed to test blood sugar 2 times a day. 06/25/14  Yes Historical Provider, MD  Glory Rosebush VERIO test  strip 2 (two) times daily. Use as directed to test blood sugar 2 times a day. 06/25/14  Yes Historical Provider, MD  ranolazine (RANEXA) 1000 MG SR tablet Take 1,000 mg by mouth 3 (three) times daily.    Yes Historical Provider, MD  Tamsulosin HCl (FLOMAX) 0.4 MG CAPS Take 0.4 mg by mouth daily.   Yes Historical Provider, MD  topiramate (TOPAMAX) 100 MG tablet Take 100 mg by mouth daily. 08/06/14  Yes Historical Provider, MD  traZODone (DESYREL) 50 MG tablet Take 50 mg by mouth at bedtime.   Yes Historical Provider, MD      VITAL SIGNS:  Blood pressure 122/72, pulse 66, resp. rate 13, SpO2 98 %.  PHYSICAL EXAMINATION:  GENERAL:  64  y.o.-year-old patient lying in the bed with no acute distress.  EYES: Pupils equal, round, reactive to light and accommodation. No scleral icterus. Extraocular muscles intact.  HEENT: Head atraumatic, normocephalic. Oropharynx and nasopharynx clear.  NECK:  Supple, no jugular venous distention. No thyroid enlargement, no tenderness.  LUNGS: Normal breath sounds bilaterally, no wheezing, rales,rhonchi or crepitation. No use of accessory muscles of respiration.  CARDIOVASCULAR: S1, S2 normal. No murmurs, rubs, or gallops.  ABDOMEN: Soft, nontender, nondistended. Bowel sounds present. No organomegaly or mass.  EXTREMITIES: No pedal edema, cyanosis, or clubbing.  NEUROLOGIC: Cranial nerves II through XII are intact except a slurred speech. Muscle strength 5/5 in all extremities. Sensation intact. Gait not checked.  PSYCHIATRIC: The patient is alert and oriented x 3.  SKIN: No obvious rash, lesion, or ulcer.   LABORATORY PANEL:   CBC  Recent Labs Lab 08/23/14 1301  WBC 12.0*  HGB 11.1*  HCT 32.8*  PLT 173   ------------------------------------------------------------------------------------------------------------------  Chemistries   Recent Labs Lab 08/23/14 1301  NA 136  K 3.1*  CL 100*  CO2 27  GLUCOSE 137*  BUN 21*  CREATININE 1.06  CALCIUM  8.7*  AST 18  ALT 12*  ALKPHOS 36*  BILITOT 0.8   ------------------------------------------------------------------------------------------------------------------  Cardiac Enzymes  Recent Labs Lab 08/23/14 1301  TROPONINI <0.03   ------------------------------------------------------------------------------------------------------------------  RADIOLOGY:  Ct Head Wo Contrast  08/23/2014   CLINICAL DATA:  Generalized weakness for 1 week. Increased confusion and slurred speech.  EXAM: CT HEAD WITHOUT CONTRAST  TECHNIQUE: Contiguous axial images were obtained from the base of the skull through the vertex without intravenous contrast.  COMPARISON:  02/12/2014  FINDINGS: The ventricles, cisterns and other CSF spaces are within normal. There is an old lacune infarct over the anterior aspect of the right lentiform nucleus unchanged. There is no mass, mass effect, shift of midline structures or acute hemorrhage. No evidence of acute infarction. Remaining bones and soft tissues are within normal.  IMPRESSION: No acute intracranial findings.  Old right-sided lacune infarct.   Electronically Signed   By: Marin Olp M.D.   On: 08/23/2014 13:50   Dg Chest Portable 1 View  08/23/2014   CLINICAL DATA:  Generalized weakness 1 week. Chest pain beginning today.  EXAM: PORTABLE CHEST - 1 VIEW  COMPARISON:  01/30/2014 and 06/25/2011  FINDINGS: Lungs are adequately inflated without consolidation or effusion. Cardiomediastinal silhouette and remainder of the exam is unchanged.  IMPRESSION: No active disease.   Electronically Signed   By: Marin Olp M.D.   On: 08/23/2014 13:32    EKG:   Orders placed or performed in visit on 10/25/13  . EKG 12-Lead    IMPRESSION AND PLAN:   Acute CVA Hypokalemia Leukocytosis Hypertension Diabetes CAD  The patient will be admitted to telemetry floor. I will continue Plavix and the statin, get an MRI of brain, echocardiogram, and carotid duplex. I will get a  PT, OT and speech study. For hypokalemia, I will give potassium supplement and follow-up potassium and magnesium level. Unclear etiology for leukocytosis, so far no source of infection, need follow-up CBC and urinalysis. Continue patient home hypertension medication. For diabetes, I will start sliding scale and check hemoglobin A1c. For CAD, continue Plavix and the statin.    All the records are reviewed and case discussed with ED provider. Management plans discussed with the patient, family and they are in agreement.  CODE STATUS: Full code  TOTAL TIME TAKING CARE OF THIS PATIENT: 53 minutes.  Demetrios Loll M.D on 08/23/2014 at 4:08 PM  Between 7am to 6pm - Pager - [phone]  After 6pm go to [email or web address] - password [email or web address] [place] [facility]  Office  [phone]  CC: Primary care physician; [facility], MD

## 2014-08-23 NOTE — ED Provider Notes (Signed)
[facility] Emergency Department Provider Note  Time seen: 1:13 PM  I have reviewed the triage vital signs and the nursing notes.   HISTORY  Chief Complaint Chest Pain    HPI COLLIS THEDE is a 64 y.o. male with a past medical history of diabetes, MI 7 with 2 stents, bipolar, hyperlipidemia, arthritis, anxiety, OCD, TIA 2, presents the emergency department with weakness. According to the patient and the wife the patient has been having increased weakness since yesterday. Today he was too weak to get out of bed and he is very somnolent which is unlike the patient per the wife. She also notes since yesterday the patient has been slurring his speech. The patient did start a new medication Abilify 5 days ago, but the wife states symptoms did not start until yesterday. Patient also describes weakness 2 days, also describes chest pain on and off 2 days. Patient denies any shortness of breath, nausea, vomiting, diarrhea, dysuria, abdominal pain, fever.Denies any focal weakness or numbness, headache, confusion. He does think his speech sounds slurred as well.     Past Medical History  Diagnosis Date  . Diabetes mellitus     Type II  . Coronary artery disease   . Sleep apnea   . Esophageal reflux   . Disc degeneration, lumbar   . Bipolar disorder   . Hyperlipidemia   . MI (myocardial infarction) 2001;2012;2012  . Arthritis   . BPD (bronchopulmonary dysplasia)   . Panic attack   . Neuropathy   . Obsessive compulsive disorder   . Carpal tunnel syndrome   . History of bladder infections   . Unspecified vitamin D deficiency   . Other dysphagia   . Iron deficiency anemia, unspecified   . Hypertension     Patient Active Problem List   Diagnosis Date Noted  . Hypertensive urgency 01/01/2013  . Hypertension   . Chest pain 09/30/2011  . CAD (coronary artery disease) 09/30/2011  . S/P coronary artery stent placement 09/30/2011  . Hyperlipidemia 09/30/2011  .  Diabetes mellitus 09/30/2011    Past Surgical History  Procedure Laterality Date  . Tonsillectomy    . Back surgery    . Skin graft    . Carpal tunnel release  2009    left  . Cardiac catheterization  6/10;2011;Aug.2012;Oct. 2012;   . Coronary angioplasty  2011 & 2001    s/p stent  . Cardiac catheterization  05/2012    [facility]; EF 60%, patent stent in the left circumflex, moderate three-vessel disease with no flow-limiting lesions. Unchanged from most recent catheterization  . Cyst removal leg Right     Current Outpatient Rx  Name  Route  Sig  Dispense  Refill  . amLODipine (NORVASC) 5 MG tablet   Oral   Take 5 mg by mouth daily.          Marland Kitchen atorvastatin (LIPITOR) 80 MG tablet   Oral   Take 80 mg by mouth at bedtime.         Marland Kitchen buPROPion (WELLBUTRIN XL) 300 MG 24 hr tablet   Oral   Take 300 mg by mouth daily.          . carvedilol (COREG) 25 MG tablet      TAKE 1 TABLET BY MOUTH TWICE DAILY   60 tablet   6   . clopidogrel (PLAVIX) 75 MG tablet   Oral   Take 75 mg by mouth daily.         Marland Kitchen  doxepin (SINEQUAN) 25 MG capsule   Oral   Take 25 mg by mouth at bedtime.          . EQUETRO 200 MG CP12 12 hr capsule      200 mg 2 (two) times daily.          . furosemide (LASIX) 40 MG tablet   Oral   Take 40 mg by mouth daily.          Marland Kitchen gabapentin (NEURONTIN) 300 MG capsule   Oral   Take 1 capsule (300 mg total) by mouth 3 (three) times daily.   90 capsule   11   . HYDROcodone-acetaminophen (NORCO/VICODIN) 5-325 MG per tablet   Oral   Take 1 tablet by mouth every 12 (twelve) hours.          . INVEGA SUSTENNA 234 MG/1.5ML SUSP      every 30 (thirty) days.          Marland Kitchen lisinopril (PRINIVIL,ZESTRIL) 40 MG tablet   Oral   Take 1 tablet (40 mg total) by mouth daily.   90 tablet   3   . LORazepam (ATIVAN) 1 MG tablet   Oral   Take 1 mg by mouth 3 (three) times daily.          . metFORMIN (GLUCOPHAGE) 500 MG tablet   Oral   Take 500 mg by  mouth daily with breakfast.          . methocarbamol (ROBAXIN) 500 MG tablet   Oral   Take 500 mg by mouth 2 (two) times daily.          . nitroGLYCERIN (NITROSTAT) 0.4 MG SL tablet   Sublingual   Place 0.4 mg under the tongue every 5 (five) minutes as needed.         Marland Kitchen oxcarbazepine (TRILEPTAL) 600 MG tablet   Oral   Take 600 mg by mouth at bedtime.         . Paliperidone Palmitate (INVEGA SUSTENNA IM)   Intramuscular   Inject into the muscle every 30 (thirty) days.         . pantoprazole (PROTONIX) 40 MG tablet   Oral   Take 40 mg by mouth daily.         . QUEtiapine (SEROQUEL XR) 400 MG 24 hr tablet   Oral   Take 400 mg by mouth at bedtime.         . ranolazine (RANEXA) 1000 MG SR tablet   Oral   Take 1,000 mg by mouth daily.          . Tamsulosin HCl (FLOMAX) 0.4 MG CAPS   Oral   Take 0.4 mg by mouth daily.         Marland Kitchen topiramate (TOPAMAX) 100 MG tablet   Oral   Take 200 mg by mouth 2 (two) times daily.          . traMADol (ULTRAM) 50 MG tablet   Oral   Take 50 mg by mouth every 6 (six) hours as needed for pain.           Allergies Morphine and related and Synalgos-dc  Family History  Problem Relation Age of Onset  . Heart disease Mother   . Heart disease Brother   . Heart disease Brother     Social History History  Substance Use Topics  . Smoking status: Former Smoker -- 15 years    Types: Cigarettes    Quit date: 05/17/2000  .  Smokeless tobacco: Never Used  . Alcohol Use: No    Review of Systems Constitutional: Negative for fever. Positive for generalized weakness. Eyes: Negative for visual changes. Cardiovascular: On/off chest pain 2 days. Denies any currently. Respiratory: Negative for shortness of breath. Gastrointestinal: Negative for abdominal pain, vomiting and diarrhea. Genitourinary: Negative for dysuria. Musculoskeletal: Negative for back pain. Skin: Negative for rash. Neurological: Negative for headaches,  focal weakness or numbness. Positive for generalized weakness. Psychiatric: Wife states the patient has been more agitated recently. 10-point ROS otherwise negative.  ____________________________________________   PHYSICAL EXAM:  VITAL SIGNS: ED Triage Vitals  Enc Vitals Group     BP 08/23/14 1255 105/64 mmHg     Pulse Rate 08/23/14 1255 71     Resp 08/23/14 1255 20     Temp --      Temp src --      SpO2 08/23/14 1255 100 %     Weight --      Height --      Head Cir --      Peak Flow --      Pain Score 08/23/14 1256 3     Pain Loc --      Pain Edu? --      Excl. in [place]? --     Constitutional: Alert but somnolent, keeps his eyes closed for most of the exam. Does answer questions appropriately, and follows all commands. Eyes: Normal exam, 2 mm PERRL, EOMI ENT   Head: Normocephalic and atraumatic.   Mouth/Throat: Mucous membranes are moist. Cardiovascular: Normal rate, regular rhythm. No murmurs Respiratory: Normal respiratory effort without tachypnea nor retractions. Breath sounds are clear Gastrointestinal: Soft and nontender. No distention.   Musculoskeletal: Nontender with normal range of motion in all extremities.  Neurologic:  Slurred/sluggish speech and moves all extremities without issue. Sensation intact in all extremities. No pronator drift. Cranial nerves intact, no facial droop.  Skin:  Skin is warm, dry and intact.  Psychiatric: Mood and affect are normal. Speech and behavior are normal.  ____________________________________________    EKG  EKG reviewed and interpreted by myself shows normal sinus rhythm at 67 bpm, narrow QRS, normal intervals, mild left axis deviation, no ST changes noted.  ____________________________________________    RADIOLOGY  CT head does not show any acute changes. Chest x-ray, no acute disease.   ____________________________________________   INITIAL IMPRESSION / ASSESSMENT AND PLAN / ED COURSE  Pertinent labs &  imaging results that were available during my care of the patient were reviewed by me and considered in my medical decision making (see chart for details).  Patient with generalized weakness, sluggishness, slurred speech, intermittent chest pain 2 days. Patient did start a new medication, Abilify, 5 days ago. Patient very somnolent in the emergency department with a blood pressure in the upper 90s. We will check labs, IV hydrate , CT head and chest x-ray. I discussed with the patient's wife over the phone Walt Geathers [phone]) ____________________________________________   FINAL CLINICAL IMPRESSION(S) / ED DIAGNOSES   generalized weakness   Harvest Dark, MD 08/24/14 1252

## 2014-08-23 NOTE — ED Notes (Signed)
Patient given urinal. Aware of need for urine.

## 2014-08-23 NOTE — ED Notes (Signed)
Pt reports to ED via EMS.  Per EMS, pt has been experiencing generalized weakness for past week.  Pt reports side/chest pain that began today.  Per EMS, pt had Abilify shot on Monday.  Pt cannot remember why he needed shot.  Pt reports is BP is normally in 150's but today is low.

## 2014-08-24 ENCOUNTER — Observation Stay: Payer: Medicare Other

## 2014-08-24 DIAGNOSIS — I639 Cerebral infarction, unspecified: Secondary | ICD-10-CM | POA: Diagnosis not present

## 2014-08-24 LAB — BASIC METABOLIC PANEL
Anion gap: 6 (ref 5–15)
BUN: 18 mg/dL (ref 6–20)
CALCIUM: 8.8 mg/dL — AB (ref 8.9–10.3)
CHLORIDE: 103 mmol/L (ref 101–111)
CO2: 30 mmol/L (ref 22–32)
Creatinine, Ser: 0.86 mg/dL (ref 0.61–1.24)
GFR calc Af Amer: >60 mL/min (ref >60)
GFR calc non Af Amer: >60 mL/min (ref >60)
GLUCOSE: 109 mg/dL — AB (ref 65–99)
Potassium: 3.2 mmol/L — ABNORMAL LOW (ref 3.5–5.1)
Sodium: 139 mmol/L (ref 135–145)

## 2014-08-24 LAB — LIPID PANEL
Cholesterol: 187 mg/dL (ref 0–200)
HDL: 39 mg/dL — ABNORMAL LOW (ref >40)
LDL CALC: 118 mg/dL — AB (ref 0–99)
Total CHOL/HDL Ratio: 4.8 RATIO
Triglycerides: 148 mg/dL (ref <150)
VLDL: 30 mg/dL (ref 0–40)

## 2014-08-24 LAB — CBC
HEMATOCRIT: 31.7 % — AB (ref 40.0–52.0)
HEMOGLOBIN: 11 g/dL — AB (ref 13.0–18.0)
MCH: 31.6 pg (ref 26.0–34.0)
MCHC: 34.8 g/dL (ref 32.0–36.0)
MCV: 90.8 fL (ref 80.0–100.0)
Platelets: 157 10*3/uL (ref 150–440)
RBC: 3.5 MIL/uL — AB (ref 4.40–5.90)
RDW: 13.4 % (ref 11.5–14.5)
WBC: 7.3 10*3/uL (ref 3.8–10.6)

## 2014-08-24 LAB — HEMOGLOBIN A1C: Hgb A1c MFr Bld: 5.4 % (ref 4.0–6.0)

## 2014-08-24 MED ORDER — ATORVASTATIN CALCIUM 40 MG PO TABS: 40.0000 mg | ORAL_TABLET | Freq: Every day | ORAL | Status: DC

## 2014-08-24 MED ORDER — POTASSIUM CHLORIDE CRYS ER 20 MEQ PO TBCR
40.0000 meq | EXTENDED_RELEASE_TABLET | Freq: Once | ORAL | Status: AC
  Administered 2014-08-24: 40 meq via ORAL
  Filled 2014-08-24: qty 2
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
## 2014-08-24 NOTE — Discharge Instructions (Signed)

## 2014-08-24 NOTE — Discharge Summary (Signed)
[facility] NAME: Roger Pruitt    MR#:  [id number]  DATE OF BIRTH:  29-Dec-1950  DATE OF ADMISSION:  08/23/2014 ADMITTING PHYSICIAN: Demetrios Loll, MD  DATE OF DISCHARGE: No discharge date for patient encounter.  PRIMARY CARE PHYSICIAN: JADALI,FAYEGH, MD    ADMISSION DIAGNOSIS:  side pain  DISCHARGE DIAGNOSIS:  Principal Problem:   CVA (cerebral infarction) Active Problems:   Acute encephalopathy   Hypokalemia   Leukocytosis   Acute CVA (cerebrovascular accident)   SECONDARY DIAGNOSIS:   Past Medical History  Diagnosis Date  . Diabetes mellitus     Type II  . Coronary artery disease   . Sleep apnea   . Esophageal reflux   . Disc degeneration, lumbar   . Bipolar disorder   . Hyperlipidemia   . MI (myocardial infarction) 2001;2012;2012  . Arthritis   . BPD (bronchopulmonary dysplasia)   . Panic attack   . Neuropathy   . Obsessive compulsive disorder   . Carpal tunnel syndrome   . History of bladder infections   . Unspecified vitamin D deficiency   . Other dysphagia   . Iron deficiency anemia, unspecified   . Hypertension     HOSPITAL COURSE:   64 year old Male with past medical history significant for hypertension, coronary artery disease, prior history of TIAs and depression presents to the hospital secondary to generalized weakness and speech changes.  #1 possible TIA-sounds more like a medication side effect from his newly started Abilify injection which he received the day before the symptoms have started. Due to his risk factors, he was admitted under observation. Carotid Dopplers are negative for any significant stenosis. - Echocardiogram is done and is pending at this time. - MRI of the brain today. -Gentle ambulate with physical therapy today.  - If All his tests come back negative, discussed with patient about discussing with PCP about discontinuing the Abilify next month as he already got  injection for this month. And then monitor. He will be discharged.  #2 hypertension-into new all his home medications. He is on Norvasc, Coreg, chlorthalidone, Lasix, hydrochlorothiazide and lisinopril. Well-controlled blood pressure. Will discontinue chlorthalidone at discharge as he is on 2 thiazide diuretics, also with hypokalemia.  #3 coronary artery disease- stable without any active chest pain. Follows with Dr. Fletcher Anon. Continue Ranexa for his chronic angina symptoms. troponins negative here. Echocardiogram is done and is pending. Patient has been having some dyspnea symptoms on exertion lately. Chest x-ray is clear. If echo is normal will advise to follow with cardiologist as outpatient  #4 depression/bipolar-continue trazodone, Topamax, Tegretol and Wellbutrin. Also just received Abilify 400 mg injection for 4 weeks. Follow-up with outpatient physician to discuss Abilify use.  #5 Diabetes Mellitus- restart metformin at discharge  DISCHARGE CONDITIONS:   Stable  CONSULTS OBTAINED:     DRUG ALLERGIES:   Allergies  Allergen Reactions  . Morphine And Related Nausea And Vomiting  . Synalgos-Dc [Aspirin-Caff-Dihydrocodeine] Itching    "Felt like 1000's bee's were stinging me"    DISCHARGE MEDICATIONS:   Current Discharge Medication List    START taking these medications   Details  atorvastatin (LIPITOR) 40 MG tablet Take 1 tablet (40 mg total) by mouth daily at 6 PM. Qty: 30 tablet, Refills: 1      CONTINUE these medications which have NOT CHANGED   Details  ABILIFY MAINTENA 400 MG SUSR Inject 400 mg into the muscle every 30 (thirty) days. Refills: 0  Alcohol Swabs 70 % PADS Use as directed. Refills: 5    amLODipine (NORVASC) 10 MG tablet Take 10 mg by mouth every morning.    buPROPion (WELLBUTRIN XL) 300 MG 24 hr tablet Take 300 mg by mouth daily.     Carbamazepine (EQUETRO) 300 MG CP12 Take 300 mg by mouth 3 (three) times daily.    carvedilol (COREG) 25 MG  tablet TAKE 1 TABLET BY MOUTH TWICE DAILY Qty: 60 tablet, Refills: 6    clopidogrel (PLAVIX) 75 MG tablet Take 75 mg by mouth daily.    doxepin (SINEQUAN) 25 MG capsule Take 50 mg by mouth at bedtime.     finasteride (PROSCAR) 5 MG tablet Take 5 mg by mouth every morning.    furosemide (LASIX) 40 MG tablet Take 40 mg by mouth daily.     gabapentin (NEURONTIN) 300 MG capsule Take 1 capsule (300 mg total) by mouth 3 (three) times daily. Qty: 90 capsule, Refills: 11    hydrochlorothiazide (HYDRODIURIL) 25 MG tablet Take 25 mg by mouth every morning.    lisinopril (PRINIVIL,ZESTRIL) 40 MG tablet Take 1 tablet (40 mg total) by mouth daily. Qty: 90 tablet, Refills: 3    LORazepam (ATIVAN) 1 MG tablet Take 1 mg by mouth at bedtime.     metFORMIN (GLUCOPHAGE) 1000 MG tablet Take 1,000 mg by mouth 2 (two) times daily with a meal.    nitroGLYCERIN (NITROSTAT) 0.4 MG SL tablet Place 0.4 mg under the tongue every 5 (five) minutes as needed.    ONETOUCH DELICA LANCETS 76E MISC 2 (two) times daily. Use as directed to test blood sugar 2 times a day. Refills: 5    ONETOUCH VERIO test strip 2 (two) times daily. Use as directed to test blood sugar 2 times a day. Refills: 5    ranolazine (RANEXA) 1000 MG SR tablet Take 1,000 mg by mouth 3 (three) times daily.     Tamsulosin HCl (FLOMAX) 0.4 MG CAPS Take 0.4 mg by mouth daily.    topiramate (TOPAMAX) 100 MG tablet Take 100 mg by mouth daily.    traZODone (DESYREL) 50 MG tablet Take 50 mg by mouth at bedtime.      STOP taking these medications     chlorthalidone (HYGROTON) 25 MG tablet          DISCHARGE INSTRUCTIONS:   1. PCP f/u in 1-2 weeks   If you experience worsening of your admission symptoms, develop shortness of breath, life threatening emergency, suicidal or homicidal thoughts you must seek medical attention immediately by calling 911 or calling your MD immediately  if symptoms less severe.  You Must read complete  instructions/literature along with all the possible adverse reactions/side effects for all the Medicines you take and that have been prescribed to you. Take any new Medicines after you have completely understood and accept all the possible adverse reactions/side effects.   Please note  You were cared for by a hospitalist during your hospital stay. If you have any questions about your discharge medications or the care you received while you were in the hospital after you are discharged, you can call the unit and asked to speak with the hospitalist on call if the hospitalist that took care of you is not available. Once you are discharged, your primary care physician will handle any further medical issues. Please note that NO REFILLS for any discharge medications will be authorized once you are discharged, as it is imperative that you return to your primary care  physician (or establish a relationship with a primary care physician if you do not have one) for your aftercare needs so that they can reassess your need for medications and monitor your lab values.    Today   CHIEF COMPLAINT:   Chief Complaint  Patient presents with  . Chest Pain     VITAL SIGNS:  Blood pressure 149/86, pulse 71, temperature 97.7 F (36.5 C), temperature source Oral, resp. rate 18, height 6\' 3"  (1.905 m), weight 95.845 kg (211 lb 4.8 oz), SpO2 100 %.  I/O:   Intake/Output Summary (Last 24 hours) at 08/24/14 0956 Last data filed at 08/24/14 0700  Gross per 24 hour  Intake  907.5 ml  Output    850 ml  Net   57.5 ml    PHYSICAL EXAMINATION:   Physical Exam  GENERAL: 64 y.o.-year-old patient lying in the bed with no acute distress.  EYES: Pupils equal, round, reactive to light and accommodation. No scleral icterus. Extraocular muscles intact.  HEENT: Head atraumatic, normocephalic. Oropharynx and nasopharynx clear.  NECK: Supple, no jugular venous distention. No thyroid enlargement, no tenderness.   LUNGS: Normal breath sounds bilaterally, no wheezing, rales,rhonchi or crepitation. No use of accessory muscles of respiration.  CARDIOVASCULAR: S1, S2 normal. No murmurs, rubs, or gallops.  ABDOMEN: Soft, nontender, nondistended. Bowel sounds present. No organomegaly or mass.  EXTREMITIES: No pedal edema, cyanosis, or clubbing.  NEUROLOGIC: Cranial nerves II through XII are intact. Muscle strength 5/5 in all extremities. Sensation intact. Gait not checked.  Speech is slow, but no dysphasia noted, no receptive or expressive aphasia noted. PSYCHIATRIC: The patient is alert and oriented x 3.  SKIN: No obvious rash, lesion, or ulcer.   DATA REVIEW:   CBC  Recent Labs Lab 08/24/14 0604  WBC 7.3  HGB 11.0*  HCT 31.7*  PLT 157    Chemistries   Recent Labs Lab 08/23/14 1301 08/23/14 1805 08/24/14 0604  NA 136  --  139  K 3.1*  --  3.2*  CL 100*  --  103  CO2 27  --  30  GLUCOSE 137*  --  109*  BUN 21*  --  18  CREATININE 1.06  --  0.86  CALCIUM 8.7*  --  8.8*  MG  --  1.7  --   AST 18  --   --   ALT 12*  --   --   ALKPHOS 36*  --   --   BILITOT 0.8  --   --     Cardiac Enzymes  Recent Labs Lab 08/23/14 1301  TROPONINI <0.03    Microbiology Results  Results for orders placed or performed in visit on 01/30/14  Urine culture     Status: None   Collection Time: 01/30/14  8:33 AM  Result Value Ref Range Status   Micro Text Report   Final       SOURCE: INDWELLING CATH    ORGANISM 1                >100,000 CFU/ML PSEUDOMONAS AERUGINOSA   ORGANISM 2                50,000 CFU ENTEROCOCCUS FAECALIS   ANTIBIOTIC                    ORG#1    ORG#2     CEFTAZIDIME  S                  CIPROFLOXACIN                 S        S         GENTAMICIN                    S                  IMIPENEM                      S                  LEVOFLOXACIN                  S        S         AMPICILLIN                             S         LINEZOLID                               S         NITROFURANTOIN                         S         TETRACYCLINE                           S           Culture, blood (single)     Status: None   Collection Time: 01/30/14  9:00 AM  Result Value Ref Range Status   Micro Text Report   Final       SOURCE: l wrist    COMMENT                   NO GROWTH AEROBICALLY/ANAEROBICALLY IN 5 DAYS   ANTIBIOTIC                                                      ED Influenza     Status: None   Collection Time: 01/30/14  9:19 AM  Result Value Ref Range Status   Influenza A By PCR NEGATIVE NEGATIVE Final   Influenza B By PCR NEGATIVE NEGATIVE Final   H1N1 flu by pcr NOT DETECTED NOT-DETECTED Final    Comment:                  ----------------------- The Xpert Flu assay (FDA approval for nasal aspirates or washes and nasopharyngeal swab specimens), is intended as an aid in the diagnosis of influenza and should not be used as a sole basis for treatment.   Culture, blood (single)     Status: None   Collection Time: 01/30/14  9:19 AM  Result Value Ref Range Status   Micro Text Report   Final       SOURCE: l thumb    ORGANISM 1  GRAM POSITIVE ROD   COMMENT                   IN AEROBIC BOTTLE ONLY   COMMENT                   POSSIBLE CONTAMINATION W/SKIN FLORA   GRAM STAIN                GRAM POSITIVE ROD   ANTIBIOTIC                                                      Urine culture     Status: None   Collection Time: 01/31/14  2:55 PM  Result Value Ref Range Status   Micro Text Report   Final       SOURCE: CLEAN CATCH    COMMENT                   NO GROWTH IN 36 HOURS   ANTIBIOTIC                                                        RADIOLOGY:  Ct Head Wo Contrast  08/23/2014   CLINICAL DATA:  Generalized weakness for 1 week. Increased confusion and slurred speech.  EXAM: CT HEAD WITHOUT CONTRAST  TECHNIQUE: Contiguous axial images were obtained from the base of the skull through the  vertex without intravenous contrast.  COMPARISON:  02/12/2014  FINDINGS: The ventricles, cisterns and other CSF spaces are within normal. There is an old lacune infarct over the anterior aspect of the right lentiform nucleus unchanged. There is no mass, mass effect, shift of midline structures or acute hemorrhage. No evidence of acute infarction. Remaining bones and soft tissues are within normal.  IMPRESSION: No acute intracranial findings.  Old right-sided lacune infarct.   Electronically Signed   By: Marin Olp M.D.   On: 08/23/2014 13:50   US Carotid Bilateral  08/23/2014   CLINICAL DATA:  Recent stroke  EXAM: BILATERAL CAROTID DUPLEX ULTRASOUND  TECHNIQUE: Pearline Cables scale imaging, color Doppler and duplex ultrasound were performed of bilateral carotid and vertebral arteries in the neck.  COMPARISON:  02/02/2014  FINDINGS: Criteria: Quantification of carotid stenosis is based on velocity parameters that correlate the residual internal carotid diameter with NASCET-based stenosis levels, using the diameter of the distal internal carotid lumen as the denominator for stenosis measurement.  The following velocity measurements were obtained:  RIGHT  ICA:  104/18 cm/sec  CCA:  77/41 cm/sec  SYSTOLIC ICA/CCA RATIO:  1.1  DIASTOLIC ICA/CCA RATIO:  1.0  ECA:  150 cm/sec  LEFT  ICA:  94/27 cm/sec  CCA:  287/86 cm/sec  SYSTOLIC ICA/CCA RATIO:  0.8  DIASTOLIC ICA/CCA RATIO:  1.5  ECA:  155 cm/sec  RIGHT CAROTID ARTERY: Grayscale images demonstrate mild atherosclerotic plaque in the region of the carotid bulb and proximal internal carotid artery. The waveforms, velocities and flow velocity ratios however demonstrate no evidence of focal hemodynamically significant stenosis.  RIGHT VERTEBRAL ARTERY:  Antegrade in nature.  LEFT CAROTID ARTERY: Grayscale images demonstrate mild atherosclerotic plaque in the region of the carotid bulb  and proximal internal carotid artery. The waveforms, velocities and flow velocity ratios  however demonstrate no evidence of focal hemodynamically significant stenosis.  LEFT VERTEBRAL ARTERY:  Antegrade in nature.  IMPRESSION: Mild plaque bilaterally without focal hemodynamically significant stenosis. No significant change from the prior exam.   Electronically Signed   By: Inez Catalina M.D.   On: 08/23/2014 17:45   Dg Chest Portable 1 View  08/23/2014   CLINICAL DATA:  Generalized weakness 1 week. Chest pain beginning today.  EXAM: PORTABLE CHEST - 1 VIEW  COMPARISON:  01/30/2014 and 06/25/2011  FINDINGS: Lungs are adequately inflated without consolidation or effusion. Cardiomediastinal silhouette and remainder of the exam is unchanged.  IMPRESSION: No active disease.   Electronically Signed   By: Marin Olp M.D.   On: 08/23/2014 13:32    EKG:   Orders placed or performed in visit on 10/25/13  . EKG 12-Lead      Management plans discussed with the patient, family and they are in agreement.  CODE STATUS:     Code Status Orders        Start     Ordered   08/23/14 1653  Full code   Continuous     08/23/14 1652    Advance Directive Documentation        Most Recent Value   Type of Advance Directive  Mental Health Advance Directive   Pre-existing out of facility DNR order (yellow form or pink MOST form)     "MOST" Form in Place?        TOTAL TIME TAKING CARE OF THIS PATIENT: 40 minutes.    Gladstone Lighter M.D on 08/24/2014 at 9:56 AM  Between 7am to 6pm - Pager - [phone]  After 6pm go to [email or web address] - password [email or web address] [facility]  Office  [phone]  CC: Primary care physician; [facility], MD

## 2014-08-24 NOTE — Progress Notes (Signed)
Pt in NAD, skin warm and dry, VSS, SR per monitor.  Pt denies any pain or discomfort at this time.  Pt discharge instructions and prescription given to and reviewed with pt. Spoke with pts wife Vonnie and went over visit and discharge with her as well.   Pt verbalized understanding.  Pt to be transported via cab to home.  IV and telemetry discontinued per policy and procedure.
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
## 2014-08-24 NOTE — Progress Notes (Signed)
[facility] NAME: Roger Pruitt    MR#:  [id number]  DATE OF BIRTH:  09-Aug-1950  SUBJECTIVE:  CHIEF COMPLAINT:   Chief Complaint  Patient presents with  . Chest Pain  Feels better, feels his speech is still slow. For MRI today Also concerned about dyspnea on exertion for 75months  REVIEW OF SYSTEMS:  Review of Systems  Constitutional: Negative for fever and chills.  Respiratory: Positive for shortness of breath. Negative for cough and wheezing.        Shortness of breath on minimal exertion  Cardiovascular: Negative for chest pain and palpitations.  Gastrointestinal: Negative for nausea, vomiting, abdominal pain, diarrhea and constipation.  Genitourinary: Negative for dysuria.  Neurological: Positive for speech change. Negative for dizziness, seizures and headaches.    DRUG ALLERGIES:   Allergies  Allergen Reactions  . Morphine And Related Nausea And Vomiting  . Synalgos-Dc [Aspirin-Caff-Dihydrocodeine] Itching    "Felt like 1000's bee's were stinging me"    VITALS:  Blood pressure 149/86, pulse 71, temperature 97.7 F (36.5 C), temperature source Oral, resp. rate 18, height 6\' 3"  (1.905 m), weight 95.845 kg (211 lb 4.8 oz), SpO2 100 %.  PHYSICAL EXAMINATION:  Physical Exam  GENERAL:  64 y.o.-year-old patient lying in the bed with no acute distress.  EYES: Pupils equal, round, reactive to light and accommodation. No scleral icterus. Extraocular muscles intact.  HEENT: Head atraumatic, normocephalic. Oropharynx and nasopharynx clear.  NECK:  Supple, no jugular venous distention. No thyroid enlargement, no tenderness.  LUNGS: Normal breath sounds bilaterally, no wheezing, rales,rhonchi or crepitation. No use of accessory muscles of respiration.  CARDIOVASCULAR: S1, S2 normal. No murmurs, rubs, or gallops.  ABDOMEN: Soft, nontender, nondistended. Bowel sounds present. No organomegaly or mass.  EXTREMITIES: No pedal  edema, cyanosis, or clubbing.  NEUROLOGIC: Cranial nerves II through XII are intact. Muscle strength 5/5 in all extremities. Sensation intact. Gait not checked.  Speech is slow, but no dysphasia noted, no receptive or expressive aphasia noted. PSYCHIATRIC: The patient is alert and oriented x 3.  SKIN: No obvious rash, lesion, or ulcer.    LABORATORY PANEL:   CBC  Recent Labs Lab 08/24/14 0604  WBC 7.3  HGB 11.0*  HCT 31.7*  PLT 157   ------------------------------------------------------------------------------------------------------------------  Chemistries   Recent Labs Lab 08/23/14 1301 08/23/14 1805 08/24/14 0604  NA 136  --  139  K 3.1*  --  3.2*  CL 100*  --  103  CO2 27  --  30  GLUCOSE 137*  --  109*  BUN 21*  --  18  CREATININE 1.06  --  0.86  CALCIUM 8.7*  --  8.8*  MG  --  1.7  --   AST 18  --   --   ALT 12*  --   --   ALKPHOS 36*  --   --   BILITOT 0.8  --   --    ------------------------------------------------------------------------------------------------------------------  Cardiac Enzymes  Recent Labs Lab 08/23/14 1301  TROPONINI <0.03   ------------------------------------------------------------------------------------------------------------------  RADIOLOGY:  Ct Head Wo Contrast  08/23/2014   CLINICAL DATA:  Generalized weakness for 1 week. Increased confusion and slurred speech.  EXAM: CT HEAD WITHOUT CONTRAST  TECHNIQUE: Contiguous axial images were obtained from the base of the skull through the vertex without intravenous contrast.  COMPARISON:  02/12/2014  FINDINGS: The ventricles, cisterns and other CSF spaces are within normal. There is an old lacune infarct  over the anterior aspect of the right lentiform nucleus unchanged. There is no mass, mass effect, shift of midline structures or acute hemorrhage. No evidence of acute infarction. Remaining bones and soft tissues are within normal.  IMPRESSION: No acute intracranial findings.  Old  right-sided lacune infarct.   Electronically Signed   By: Marin Olp M.D.   On: 08/23/2014 13:50   US Carotid Bilateral  08/23/2014   CLINICAL DATA:  Recent stroke  EXAM: BILATERAL CAROTID DUPLEX ULTRASOUND  TECHNIQUE: Pearline Cables scale imaging, color Doppler and duplex ultrasound were performed of bilateral carotid and vertebral arteries in the neck.  COMPARISON:  02/02/2014  FINDINGS: Criteria: Quantification of carotid stenosis is based on velocity parameters that correlate the residual internal carotid diameter with NASCET-based stenosis levels, using the diameter of the distal internal carotid lumen as the denominator for stenosis measurement.  The following velocity measurements were obtained:  RIGHT  ICA:  104/18 cm/sec  CCA:  58/85 cm/sec  SYSTOLIC ICA/CCA RATIO:  1.1  DIASTOLIC ICA/CCA RATIO:  1.0  ECA:  150 cm/sec  LEFT  ICA:  94/27 cm/sec  CCA:  027/74 cm/sec  SYSTOLIC ICA/CCA RATIO:  0.8  DIASTOLIC ICA/CCA RATIO:  1.5  ECA:  155 cm/sec  RIGHT CAROTID ARTERY: Grayscale images demonstrate mild atherosclerotic plaque in the region of the carotid bulb and proximal internal carotid artery. The waveforms, velocities and flow velocity ratios however demonstrate no evidence of focal hemodynamically significant stenosis.  RIGHT VERTEBRAL ARTERY:  Antegrade in nature.  LEFT CAROTID ARTERY: Grayscale images demonstrate mild atherosclerotic plaque in the region of the carotid bulb and proximal internal carotid artery. The waveforms, velocities and flow velocity ratios however demonstrate no evidence of focal hemodynamically significant stenosis.  LEFT VERTEBRAL ARTERY:  Antegrade in nature.  IMPRESSION: Mild plaque bilaterally without focal hemodynamically significant stenosis. No significant change from the prior exam.   Electronically Signed   By: Inez Catalina M.D.   On: 08/23/2014 17:45   Dg Chest Portable 1 View  08/23/2014   CLINICAL DATA:  Generalized weakness 1 week. Chest pain beginning today.  EXAM:  PORTABLE CHEST - 1 VIEW  COMPARISON:  01/30/2014 and 06/25/2011  FINDINGS: Lungs are adequately inflated without consolidation or effusion. Cardiomediastinal silhouette and remainder of the exam is unchanged.  IMPRESSION: No active disease.   Electronically Signed   By: Marin Olp M.D.   On: 08/23/2014 13:32    EKG:   Orders placed or performed in visit on 10/25/13  . EKG 12-Lead    ASSESSMENT AND PLAN:   64 year old Male with past medical history significant for hypertension, coronary artery disease, prior history of TIAs and depression presents to the hospital secondary to generalized weakness and speech changes.  #1 possible TIA-sounds more like a medication side effect from his newly started Abilify injection which he received the day before the symptoms have started. Due to his risk factors, he was admitted under observation. Carotid Dopplers are negative for any significant stenosis. - Echocardiogram is done and is pending at this time. - MRI of the brain today. -Gentle ambulate with physical therapy today.  - F All his tests come back negative, discussed with patient about discussing with PCP about discontinuing the Abilify next month as he already got injection for this month. And then monitor. He will be discharged.  #2 hypertension-into new all his home medications. He is on Norvasc, Coreg, chlorthalidone, Lasix, hydrochlorothiazide and lisinopril. Well-controlled blood pressure.  #3 coronary artery disease- stable without any  active chest pain. Follows with Dr. Fletcher Anon. Continue Ranexa for his chronic angina symptoms. Echocardiogram is done and is pending. Patient has been having some dyspnea symptoms on exertion lately. Chest x-ray is clear. If echo is normal will advise to follow with cardiologist as outpatient  #4 depression/bipolar-continue trazodone, Topamax, Tegretol and Wellbutrin. Also just received Abilify 400 mg injection for 4 weeks. Follow-up with outpatient  physician to discuss Abilify use.  #5 DVT prophylaxis-on subcutaneous heparin.   All the records are reviewed and case discussed with Care Management/Social Workerr. Management plans discussed with the patient, family and they are in agreement.  CODE STATUS: Full Code  TOTAL TIME TAKING CARE OF THIS PATIENT: 38 minutes.   POSSIBLE D/C IN 1 DAY, DEPENDING ON CLINICAL CONDITION.   Gladstone Lighter M.D on 08/24/2014 at 9:37 AM  Between 7am to 6pm - Pager - [phone]  After 6pm go to [email or web address] - password [email or web address] [facility]  Office  [phone]  CC: Primary care physician; [facility], MD

## 2014-09-16 ENCOUNTER — Encounter: Payer: Self-pay | Admitting: *Deleted

## 2014-09-17 ENCOUNTER — Ambulatory Visit: Payer: Self-pay | Admitting: Urology

## 2014-09-17 ENCOUNTER — Encounter: Payer: Self-pay | Admitting: Urology

## 2014-09-30 NOTE — Progress Notes (Signed)
   2014-08-25 1217  Acute Rehab OT Goals  Patient Stated Goal To return home  OT Goal Formulation With patient  Time For Goal Achievement 08/25/14  Potential to Achieve Goals Good  OT Time Calculation  OT Start Time (ACUTE ONLY) 1142  OT Stop Time (ACUTE ONLY) 1156  OT Time Calculation (min) 14 min  OT G-codes **NOT FOR INPATIENT CLASS**  Functional Assessment Tool Used clinical judgment  Functional Limitation Self care  Self Care Current Status (G9562) CI  Self Care Goal Status (Z3086) CI  Self Care Discharge Status (V7846) CI  OT General Charges  $OT Visit 1 Procedure  OT Evaluation  $Initial OT Evaluation Tier I 1 Procedure  late entry g codes for 25-Aug-2014 Sharon Mt, MS/OTR/L 09/30/2014 9:05 AM

## 2014-10-02 ENCOUNTER — Telehealth: Payer: Self-pay

## 2014-10-02 DIAGNOSIS — N400 Enlarged prostate without lower urinary tract symptoms: Secondary | ICD-10-CM

## 2014-10-02 NOTE — Telephone Encounter (Signed)
Pt saw Ria Comment back in April for BPH, CIC, and urinary rentention. Pt is s/p KTP. Pharmacy is requesting a refill on finasteride. Pt had an appt in June but no showed and has not rescheduled. Please advise.

## 2014-10-02 NOTE — Telephone Encounter (Signed)
He has memory issues.  Give him one month refill and reschedule his appointment.

## 2014-10-03 MED ORDER — FINASTERIDE 5 MG PO TABS: 5.0000 mg | ORAL_TABLET | Freq: Every day | ORAL | Status: DC

## 2014-10-03 NOTE — Telephone Encounter (Signed)
Spoke with pt in reference to medication. Per pt wife pt has an appt in the middle of July. Per our records pt does not have an appt on record. Made pt aware. Pt stated he would call back if needed. Medication was sent to pharmacy. Cw,lpn

## 2014-10-06 ENCOUNTER — Encounter: Payer: Self-pay | Admitting: Cardiovascular Disease

## 2014-10-06 ENCOUNTER — Telehealth: Payer: Self-pay | Admitting: Cardiovascular Disease

## 2014-10-06 ENCOUNTER — Ambulatory Visit (INDEPENDENT_AMBULATORY_CARE_PROVIDER_SITE_OTHER): Payer: Medicare Other | Admitting: Cardiovascular Disease

## 2014-10-06 VITALS — BP 98/60 | HR 63 | Ht 74.0 in | Wt 209.8 lb

## 2014-10-06 DIAGNOSIS — R0602 Shortness of breath: Secondary | ICD-10-CM | POA: Diagnosis not present

## 2014-10-06 DIAGNOSIS — I10 Essential (primary) hypertension: Secondary | ICD-10-CM

## 2014-10-06 DIAGNOSIS — E785 Hyperlipidemia, unspecified: Secondary | ICD-10-CM | POA: Diagnosis not present

## 2014-10-06 DIAGNOSIS — I2510 Atherosclerotic heart disease of native coronary artery without angina pectoris: Secondary | ICD-10-CM | POA: Diagnosis not present

## 2014-10-06 DIAGNOSIS — I639 Cerebral infarction, unspecified: Secondary | ICD-10-CM

## 2014-10-06 NOTE — Assessment & Plan Note (Signed)
Continue treatment with atorvastatin with a target LDL of less than 70. 

## 2014-10-06 NOTE — Assessment & Plan Note (Signed)
His blood pressure is low and he seems to have dizziness and weakness in his legs. I discontinued amlodipine. He is on multiple other blood pressure medications.

## 2014-10-06 NOTE — Patient Instructions (Signed)
Medication Instructions:  Your physician has recommended you make the following change in your medication:  STOP taking amlodipine   Labwork: none  Testing/Procedures: none  Follow-Up: Your physician recommends that you schedule a follow-up appointment in: three months   Any Other Special Instructions Will Be Listed Below (If Applicable).

## 2014-10-06 NOTE — Assessment & Plan Note (Signed)
He currently has no anginal symptoms. I recommend continuing medical therapy.

## 2014-10-06 NOTE — Progress Notes (Signed)
Primary Care physician: Dr. Rosario Jacks  HPI  This is a 64 year old male who is here today for a followup visit. He has known history of coronary artery disease, stent x2 to his circumflex, hypertension, hyperlipidemia, diabetes, obstructive sleep apnea, chronic pain, bipolar and schizophrenia. He had an abnormal stress test in March, 2014 which showed possible ischemia in the inferolateral wall. He underwent cardiac catheterization which showed patent stent in the left circumflex with moderate nonobstructive three-vessel disease. His coronary anatomy was unchanged from prior cardiac catheterization. He has chronic stable angina which has been reasonably controlled with medications. He was hospitalized in May with slurred speech and possible stroke. However, workup was negative and it was determined that he probably had a reaction to treatment with Abilify. No worsening chest pain. He complains of fatigue, dizziness and weakness in his legs. He is noted to have low blood pressure today.    Allergies  Allergen Reactions  . Morphine And Related Nausea And Vomiting  . Sulfa Antibiotics Nausea Only  . Synalgos-Dc [Aspirin-Caff-Dihydrocodeine] Itching    "Felt like 1000's bee's were stinging me"     Current Outpatient Prescriptions on File Prior to Visit  Medication Sig Dispense Refill  . Alcohol Swabs 70 % PADS Use as directed.  5  . amLODipine (NORVASC) 10 MG tablet Take 10 mg by mouth every morning.    Marland Kitchen atorvastatin (LIPITOR) 40 MG tablet Take 1 tablet (40 mg total) by mouth daily at 6 PM. 30 tablet 1  . buPROPion (WELLBUTRIN XL) 300 MG 24 hr tablet Take 300 mg by mouth daily.     . Carbamazepine (EQUETRO) 300 MG CP12 Take 300 mg by mouth 3 (three) times daily.    . carvedilol (COREG) 25 MG tablet TAKE 1 TABLET BY MOUTH TWICE DAILY 60 tablet 6  . clopidogrel (PLAVIX) 75 MG tablet Take 75 mg by mouth daily.    Marland Kitchen doxepin (SINEQUAN) 25 MG capsule Take 50 mg by mouth at bedtime.     .  finasteride (PROSCAR) 5 MG tablet Take 5 mg by mouth every morning.    . furosemide (LASIX) 40 MG tablet Take 40 mg by mouth daily.     Marland Kitchen gabapentin (NEURONTIN) 300 MG capsule Take 1 capsule (300 mg total) by mouth 3 (three) times daily. 90 capsule 11  . hydrochlorothiazide (HYDRODIURIL) 25 MG tablet Take 25 mg by mouth every morning.    Marland Kitchen lisinopril (PRINIVIL,ZESTRIL) 40 MG tablet Take 1 tablet (40 mg total) by mouth daily. 90 tablet 3  . LORazepam (ATIVAN) 1 MG tablet Take 1 mg by mouth at bedtime.     . metFORMIN (GLUCOPHAGE) 1000 MG tablet Take 1,000 mg by mouth 2 (two) times daily with a meal.    . nitroGLYCERIN (NITROSTAT) 0.4 MG SL tablet Place 0.4 mg under the tongue every 5 (five) minutes as needed.    Glory Rosebush DELICA LANCETS 75Z MISC 2 (two) times daily. Use as directed to test blood sugar 2 times a day.  5  . ONETOUCH VERIO test strip 2 (two) times daily. Use as directed to test blood sugar 2 times a day.  5  . ranolazine (RANEXA) 1000 MG SR tablet Take 1,000 mg by mouth 3 (three) times daily.     . Tamsulosin HCl (FLOMAX) 0.4 MG CAPS Take 0.4 mg by mouth daily.    Marland Kitchen topiramate (TOPAMAX) 100 MG tablet Take 100 mg by mouth daily.    . traZODone (DESYREL) 50 MG tablet Take 50 mg  by mouth at bedtime.     No current facility-administered medications on file prior to visit.     Past Medical History  Diagnosis Date  . Diabetes mellitus     Type II  . Coronary artery disease   . Sleep apnea   . Esophageal reflux   . Disc degeneration, lumbar   . Bipolar disorder   . Hyperlipidemia   . MI (myocardial infarction) 2001;2012;2012  . Arthritis   . BPD (bronchopulmonary dysplasia)   . Panic attack   . Neuropathy   . Obsessive compulsive disorder   . Carpal tunnel syndrome   . History of bladder infections   . Unspecified vitamin D deficiency   . Other dysphagia   . Iron deficiency anemia, unspecified   . Hypertension   . Hematuria   . Elevated PSA   . History of balanitis     . Nocturia   . Urethral stricture   . Hemorrhoid   . Incomplete bladder emptying   . Urinary retention   . BPH (benign prostatic hyperplasia)   . Kidney stones      Past Surgical History  Procedure Laterality Date  . Tonsillectomy    . Back surgery    . Skin graft    . Carpal tunnel release  2009    left  . Cardiac catheterization  6/10;2011;Aug.2012;Oct. 2012;   . Coronary angioplasty  2011 & 2001    s/p stent  . Cardiac catheterization  05/2012    [facility]; EF 60%, patent stent in the left circumflex, moderate three-vessel disease with no flow-limiting lesions. Unchanged from most recent catheterization  . Cyst removal leg Right      Family History  Problem Relation Age of Onset  . Heart disease Mother   . Heart disease Brother   . Heart disease Brother   . Prostate cancer Father      History   Social History  . Marital Status: Married    Spouse Name: N/A  . Number of Children: N/A  . Years of Education: N/A   Occupational History  . Not on file.   Social History Main Topics  . Smoking status: Former Smoker -- 15 years    Types: Cigarettes    Quit date: 05/17/2000  . Smokeless tobacco: Never Used     Comment: quit 17 + years  . Alcohol Use: No  . Drug Use: No  . Sexual Activity: Not on file   Other Topics Concern  . Not on file   Social History Narrative     PHYSICAL EXAM   BP 98/60 mmHg  Pulse 63  Ht 6\' 2"  (1.88 m)  Wt 209 lb 12 oz (95.142 kg)  BMI 26.92 kg/m2  Constitutional: He is oriented to person, place, and time. He appears well-developed and well-nourished. No distress.  HENT: No nasal discharge.  Head: Normocephalic and atraumatic.  Eyes: Pupils are equal and round. Right eye exhibits no discharge. Left eye exhibits no discharge.  Neck: Normal range of motion. Neck supple. No JVD present. No thyromegaly present.  Cardiovascular: Normal rate, regular rhythm, normal heart sounds and. Exam reveals no gallop and no friction rub. No  murmur heard.  Pulmonary/Chest: Effort normal and breath sounds normal. No stridor. No respiratory distress. He has no wheezes. He has no rales. He exhibits no tenderness.  Abdominal: Soft. Bowel sounds are normal. He exhibits no distension. There is no tenderness. There is no rebound and no guarding.  Musculoskeletal: Normal range of motion.  He exhibits no edema and no tenderness.  Neurological: He is alert and oriented to person, place, and time. Coordination normal.  Skin: Skin is warm and dry. No rash noted. He is not diaphoretic. No erythema. No pallor.  Psychiatric: He has a normal mood and affect. His behavior is normal. Judgment and thought content normal.     EKG: Sinus  Rhythm  -First degree A-V block  PRi = 234 -Nonspecific QRS widening and anterior fascicular block.   ABNORMAL    ASSESSMENT AND PLAN

## 2014-10-06 NOTE — Telephone Encounter (Signed)
Obtained medicaid [place] npi from [place] with Dr. Guerry Bruin office.  This is good for 6 visits to see Dr. Fletcher Anon.

## 2014-10-07 ENCOUNTER — Other Ambulatory Visit: Payer: Self-pay

## 2014-10-07 ENCOUNTER — Ambulatory Visit
Admission: EM | Admit: 2014-10-07 | Discharge: 2014-10-07 | Disposition: A | Discharge status: Home / Self Care | Payer: Medicare Other | Attending: Internal Medicine | Admitting: Internal Medicine

## 2014-10-07 ENCOUNTER — Encounter: Payer: Self-pay | Admitting: Emergency Medicine

## 2014-10-07 DIAGNOSIS — Z87891 Personal history of nicotine dependence: Secondary | ICD-10-CM | POA: Insufficient documentation

## 2014-10-07 DIAGNOSIS — Z87442 Personal history of urinary calculi: Secondary | ICD-10-CM | POA: Insufficient documentation

## 2014-10-07 DIAGNOSIS — F319 Bipolar disorder, unspecified: Secondary | ICD-10-CM | POA: Diagnosis not present

## 2014-10-07 DIAGNOSIS — I10 Essential (primary) hypertension: Secondary | ICD-10-CM | POA: Diagnosis not present

## 2014-10-07 DIAGNOSIS — I2510 Atherosclerotic heart disease of native coronary artery without angina pectoris: Secondary | ICD-10-CM | POA: Diagnosis not present

## 2014-10-07 DIAGNOSIS — I252 Old myocardial infarction: Secondary | ICD-10-CM | POA: Insufficient documentation

## 2014-10-07 DIAGNOSIS — K219 Gastro-esophageal reflux disease without esophagitis: Secondary | ICD-10-CM | POA: Insufficient documentation

## 2014-10-07 DIAGNOSIS — E559 Vitamin D deficiency, unspecified: Secondary | ICD-10-CM | POA: Diagnosis not present

## 2014-10-07 DIAGNOSIS — N400 Enlarged prostate without lower urinary tract symptoms: Secondary | ICD-10-CM | POA: Diagnosis not present

## 2014-10-07 DIAGNOSIS — G4730 Sleep apnea, unspecified: Secondary | ICD-10-CM | POA: Diagnosis not present

## 2014-10-07 DIAGNOSIS — Z79899 Other long term (current) drug therapy: Secondary | ICD-10-CM | POA: Diagnosis not present

## 2014-10-07 DIAGNOSIS — M5136 Other intervertebral disc degeneration, lumbar region: Secondary | ICD-10-CM | POA: Diagnosis not present

## 2014-10-07 DIAGNOSIS — N390 Urinary tract infection, site not specified: Secondary | ICD-10-CM | POA: Diagnosis not present

## 2014-10-07 DIAGNOSIS — M549 Dorsalgia, unspecified: Secondary | ICD-10-CM | POA: Diagnosis not present

## 2014-10-07 DIAGNOSIS — R300 Dysuria: Secondary | ICD-10-CM | POA: Diagnosis present

## 2014-10-07 DIAGNOSIS — E785 Hyperlipidemia, unspecified: Secondary | ICD-10-CM | POA: Diagnosis not present

## 2014-10-07 DIAGNOSIS — E119 Type 2 diabetes mellitus without complications: Secondary | ICD-10-CM | POA: Insufficient documentation

## 2014-10-07 DIAGNOSIS — F42 Obsessive-compulsive disorder: Secondary | ICD-10-CM | POA: Diagnosis not present

## 2014-10-07 LAB — URINALYSIS COMPLETE WITH MICROSCOPIC (ARMC ONLY)
Bilirubin Urine: NEGATIVE
GLUCOSE, UA: NEGATIVE mg/dL
KETONES UR: NEGATIVE mg/dL
NITRITE: POSITIVE — AB
Protein, ur: NEGATIVE mg/dL
SPECIFIC GRAVITY, URINE: 1.015 (ref 1.005–1.030)
Squamous Epithelial / LPF: NONE SEEN — AB
pH: 6.5 (ref 5.0–8.0)

## 2014-10-07 MED ORDER — CIPROFLOXACIN HCL 500 MG PO TABS: 500.0000 mg | ORAL_TABLET | Freq: Two times a day (BID) | ORAL | Status: DC

## 2014-10-07 NOTE — ED Provider Notes (Signed)
CSN: [id number]     Arrival date & time 10/07/14  1001 History   First MD Initiated Contact with Patient 10/07/14 1045     Chief Complaint  Patient presents with  . Dysuria  . Back Pain   (Consider location/radiation/quality/duration/timing/severity/associated sxs/prior Treatment) HPI   This a 64 year old gentleman presents with a recurrent UTI at this time  for 1 week. The patient states that he self catheterized himself 4 times a day because he is not able to completely empty his bladder. He is a poor historian and does not know the reason for his bladder condition. Says he will frequently have infections from the catheterization process. He denies any fever or chills but has had some minor low back pain. In the past when his UTIs have been treated he does not always get relief from his back pain. Has no flank or CVA pain or tenderness  Past Medical History  Diagnosis Date  . Diabetes mellitus     Type II  . Coronary artery disease   . Sleep apnea   . Esophageal reflux   . Disc degeneration, lumbar   . Bipolar disorder   . Hyperlipidemia   . MI (myocardial infarction) 2001;2012;2012  . Arthritis   . BPD (bronchopulmonary dysplasia)   . Panic attack   . Neuropathy   . Obsessive compulsive disorder   . Carpal tunnel syndrome   . History of bladder infections   . Unspecified vitamin D deficiency   . Other dysphagia   . Iron deficiency anemia, unspecified   . Hypertension   . Hematuria   . Elevated PSA   . History of balanitis   . Nocturia   . Urethral stricture   . Hemorrhoid   . Incomplete bladder emptying   . Urinary retention   . BPH (benign prostatic hyperplasia)   . Kidney stones    Past Surgical History  Procedure Laterality Date  . Tonsillectomy    . Back surgery    . Skin graft    . Carpal tunnel release  2009    left  . Cardiac catheterization  6/10;2011;Aug.2012;Oct. 2012;   . Coronary angioplasty  2011 & 2001    s/p stent  . Cardiac catheterization   05/2012    [facility]; EF 60%, patent stent in the left circumflex, moderate three-vessel disease with no flow-limiting lesions. Unchanged from most recent catheterization  . Cyst removal leg Right    Family History  Problem Relation Age of Onset  . Heart disease Mother   . Heart disease Brother   . Heart disease Brother   . Prostate cancer Father    History  Substance Use Topics  . Smoking status: Former Smoker -- 15 years    Types: Cigarettes    Quit date: 05/17/2000  . Smokeless tobacco: Never Used     Comment: quit 17 + years  . Alcohol Use: No    Review of Systems  Constitutional: Negative for fever and chills.  Genitourinary: Positive for dysuria and urgency.  All other systems reviewed and are negative.   Allergies  Morphine and related; Sulfa antibiotics; and Synalgos-dc  Home Medications   Prior to Admission medications   Medication Sig Start Date End Date Taking? Authorizing Provider  Alcohol Swabs 70 % PADS Use as directed. 08/09/14   Historical Provider, MD  atorvastatin (LIPITOR) 40 MG tablet Take 1 tablet (40 mg total) by mouth daily at 6 PM. 08/24/14   Gladstone Lighter, MD  buPROPion (WELLBUTRIN XL) 300  MG 24 hr tablet Take 300 mg by mouth daily.  05/16/12   Historical Provider, MD  Carbamazepine (EQUETRO) 300 MG CP12 Take 300 mg by mouth 3 (three) times daily.    Historical Provider, MD  carvedilol (COREG) 25 MG tablet TAKE 1 TABLET BY MOUTH TWICE DAILY 07/29/14   Wellington Hampshire, MD  ciprofloxacin (CIPRO) 500 MG tablet Take 1 tablet (500 mg total) by mouth 2 (two) times daily. 10/07/14   Lorin Picket, PA-C  clopidogrel (PLAVIX) 75 MG tablet Take 75 mg by mouth daily.    Historical Provider, MD  doxepin (SINEQUAN) 25 MG capsule Take 50 mg by mouth at bedtime.  12/28/12   Historical Provider, MD  finasteride (PROSCAR) 5 MG tablet Take 5 mg by mouth every morning.    Historical Provider, MD  gabapentin (NEURONTIN) 300 MG capsule Take 1 capsule (300 mg total) by mouth  3 (three) times daily. 02/06/13   Max T Hyatt, DPM  hydrochlorothiazide (HYDRODIURIL) 25 MG tablet Take 25 mg by mouth every morning.    Historical Provider, MD  lisinopril (PRINIVIL,ZESTRIL) 40 MG tablet Take 1 tablet (40 mg total) by mouth daily. 10/25/13   Wellington Hampshire, MD  LORazepam (ATIVAN) 1 MG tablet Take 1 mg by mouth at bedtime.     Historical Provider, MD  metFORMIN (GLUCOPHAGE) 1000 MG tablet Take 1,000 mg by mouth 2 (two) times daily with a meal.    Historical Provider, MD  nitroGLYCERIN (NITROSTAT) 0.4 MG SL tablet Place 0.4 mg under the tongue every 5 (five) minutes as needed.    Historical Provider, MD  [facility] DELICA LANCETS 83G MISC 2 (two) times daily. Use as directed to test blood sugar 2 times a day. 06/25/14   Historical Provider, MD  Glory Rosebush VERIO test strip 2 (two) times daily. Use as directed to test blood sugar 2 times a day. 06/25/14   Historical Provider, MD  ranolazine (RANEXA) 1000 MG SR tablet Take 1,000 mg by mouth 2 (two) times daily.     Historical Provider, MD  Tamsulosin HCl (FLOMAX) 0.4 MG CAPS Take 0.4 mg by mouth daily.    Historical Provider, MD  topiramate (TOPAMAX) 100 MG tablet Take 100 mg by mouth daily. 08/06/14   Historical Provider, MD  traZODone (DESYREL) 50 MG tablet Take 50 mg by mouth at bedtime.    Historical Provider, MD   BP 144/85 mmHg  Pulse 63  Temp(Src) 98.2 F (36.8 C) (Tympanic)  Resp 16  Ht 6' (1.829 m)  Wt 209 lb (94.802 kg)  BMI 28.34 kg/m2  SpO2 100% Physical Exam  Constitutional: He is oriented to person, place, and time. He appears well-developed and well-nourished.  HENT:  Head: Normocephalic and atraumatic.  Eyes: Pupils are equal, round, and reactive to light.  Cardiovascular: Normal rate, regular rhythm and normal heart sounds.  Exam reveals no gallop and no friction rub.   No murmur heard. Pulmonary/Chest: Effort normal and breath sounds normal. No respiratory distress. He has no wheezes. He has no rales. He exhibits  no tenderness.  Abdominal: Soft. Bowel sounds are normal. He exhibits no distension and no mass. There is no tenderness. There is no rebound and no guarding.  Neurological: He is alert and oriented to person, place, and time.  Skin: Skin is warm and dry.  Psychiatric: He has a normal mood and affect. His behavior is normal. Judgment and thought content normal.  Nursing note and vitals reviewed.   ED Course  Procedures (including  critical care time) Labs Review Labs Reviewed  URINALYSIS COMPLETEWITH MICROSCOPIC (Nowthen ONLY) - Abnormal; Notable for the following:    APPearance CLOUDY (*)    Hgb urine dipstick TRACE (*)    Nitrite POSITIVE (*)    Leukocytes, UA 3+ (*)    Bacteria, UA MANY (*)    Squamous Epithelial / LPF NONE SEEN (*)    All other components within normal limits  URINE CULTURE    Imaging Review No results found.   MDM   1. Recurrent UTI    Discharge Medication List as of 10/07/2014 11:28 AM    START taking these medications   Details  ciprofloxacin (CIPRO) 500 MG tablet Take 1 tablet (500 mg total) by mouth 2 (two) times daily., Starting 10/07/2014, Until Discontinued, Print      Plan: 1. Test/x-ray results and diagnosis reviewed with patient 2. rx as per orders; risks, benefits, potential side effects reviewed with patient 3. Recommend supportive treatment with fluids 4. F/u prn if symptoms worsen or don't improve      Lorin Picket, PA-C 10/07/14 1150

## 2014-10-07 NOTE — ED Notes (Signed)
Patient c/o burning when urinating and lower back pain for a week.  Patient denies fevers.

## 2014-10-07 NOTE — Discharge Instructions (Signed)
Catheter-Associated Urinary Tract Infection FAQs °WHAT IS "CATHETER-ASSOCIATED" URINARY TRACT INFECTION? °A urinary tract infection (also called "UTI") is an infection in the urinary system, which includes the bladder (which stores the urine) and the kidneys (which filter the blood to make urine). Germs (for example, bacteria or yeasts) do not normally live in these areas; but if germs are introduced, an infection can occur. If you have a urinary catheter, germs can travel along the catheter and cause an infection in your bladder or your kidney; in that case it is called a catheter-associated urinary tract infection (or "CA-UTI").  °WHAT IS A URINARY CATHETER? °A urinary catheter is a thin tube placed in the bladder to drain urine. Urine drains through the tube into a bag that collects the urine. A urinary  °catheter may be used: °· If you are not able to urinate on your own. °· To measure the amount of urine that you make, for example, during intensive care. °· During and after some types of surgery. °· During some tests of the kidneys and bladder . °People with urinary catheters have a much higher chance of getting a urinary tract infection than people who don't have a catheter. °HOW DO I GET A CATHETER-ASSOCIATED URINARY TRACT INFECTION (CA-UTI)? °If germs enter the urinary tract, they may cause an infection. Many of the germs that cause a catheter-associated urinary tract infection are common germs found in your intestines that do not usually cause an infection there. Germs can enter the urinary tract when the catheter is being put in or while the catheter remains in the bladder.  °WHAT ARE THE SYMPTOMS OF A URINARY TRACT INFECTION?  °Some of the common symptoms of a urinary tract infection are: °· Burning or pain in the lower abdomen (that is, below the stomach). °· Fever. °· Bloody urine may be a sign of infection, but is also caused by other problems . °· Burning during urination or an increase in the  frequency of urination after the catheter is removed. °Sometimes people with catheter-associated urinary tract infections do not have these symptoms of infection. °CAN CATHETER-ASSOCIATED URINARY TRACT INFECTIONS BE TREATED? °Yes, most catheter-associated urinary tract infections can be treated with antibiotics and removal or change of the catheter. Your doctor will determine which antibiotic is best for you.  °WHAT ARE SOME OF THE THINGS THAT HOSPITALS ARE DOING TO PREVENT CATHETER-ASSOCIATED URINARY TRACT INFECTIONS? °To prevent urinary tract infections, doctors and nurses take the following actions.  °Catheter insertion °· Catheters are put in only when necessary and they are removed as soon as possible. °· Only properly trained persons insert catheters using sterile ("clean") technique. °· The skin in the area where the catheter will be inserted is cleaned before inserting the catheter. °· Other methods to drain the urine are sometimes used, such as: °¨ External catheters in men (these look like condoms and are placed over the penis rather than into the penis) °¨ Putting a temporary catheter in to drain the urine and removing it right away. This is called intermittent urethral catheterization. °Catheter care °· Healthcare providers clean their hands by washing them with soap and water or using an alcohol-based hand rub before and after touching your catheter. °¨ If you do not see your providers clean their hands, please ask them to do so. °· Avoid disconnecting the catheter and drain tube. This helps to prevent germs from getting into the catheter tube. °· The catheter is secured to the leg to prevent pulling on the   catheter. °· Avoid twisting or kinking the catheter. °· Keep the bag lower than the bladder to prevent urine from backflowing to the bladder. °· Empty the bag regularly. The drainage spout should not touch anything while emptying the bag. °WHAT CAN I DO TO HELP PREVENT CATHETER-ASSOCIATED URINARY  TRACT INFECTIONS IF I HAVE A CATHETER? °· Always clean your hands before and after doing catheter care. °· Always keep your urine bag below the level of your bladder. °· Do not tug or pull on the tubing. °· Do not twist or kink the catheter tubing. °· Ask your healthcare provider each day if you still need the catheter. °WHAT DO I NEED TO DO WHEN I GO HOME FROM THE HOSPITAL? °· If you will be going home with a catheter, your doctor or nurse should explain everything you need to know about taking care of the catheter. Make sure you understand how to care for it before you leave the hospital. °· If you develop any of the symptoms of a urinary tract infection, such as burning or pain in the lower abdomen, fever, or an increase in the frequency of urination, contact your doctor or nurse immediately. °· Before you go home, make sure you know who to contact if you have questions or problems after you get home. °If you have questions, please ask your doctor or nurse. °Developed and co-sponsored by The Society for Healthcare Epidemiology of America (SHEA); Infectious Diseases Society of America (IDSA); The American [employer] Association; Association for Professionals in Infection Control and Epidemiology (APIC); Center for Disease Control (CDC); and The Joint Commission °Document Released: 12/07/2011 Document Reviewed: 12/07/2011 °Exit[employer]® Patient Information ©2015 [employer]. This information is not intended to replace advice given to you by your health care provider. Make sure you discuss any questions you have with your health care provider. ° °

## 2014-10-09 ENCOUNTER — Telehealth: Payer: Self-pay | Admitting: Internal Medicine

## 2014-10-09 ENCOUNTER — Telehealth (HOSPITAL_COMMUNITY): Payer: Self-pay

## 2014-10-09 LAB — URINE CULTURE
Culture: >=100000
Special Requests: NORMAL

## 2014-10-09 MED ORDER — CEPHALEXIN 500 MG PO CAPS: 500.0000 mg | ORAL_CAPSULE | Freq: Two times a day (BID) | ORAL | Status: DC

## 2014-10-09 NOTE — ED Notes (Signed)
Called patient and was advised culture report has been reviewed, and he needs a new antibiotic. new antibiotic that has been called in for him to his pharmacy  and he is to stop his old antibiotic. Patient verbalized understanding of action plan

## 2014-10-09 NOTE — ED Notes (Signed)
Urine culture growing organism resistant to cipro; patient was started on cipro at recent visit to urgent care. Will substitute keflex, which organism is sensitive to. Rx sent to pharmacy of record. Will ask nursing staff to let patient know.    Sherlene Shams, MD 10/09/14 [phone]

## 2014-10-09 NOTE — Telephone Encounter (Signed)
-----   Message from Jessee Avers, RN sent at 10/09/2014  3:11 PM EDT ----- Patient is on Cipro and bacteria is Resistant to Cipro.  Please let staff know further action.

## 2014-10-14 ENCOUNTER — Encounter: Payer: Self-pay | Admitting: *Deleted

## 2014-10-16 ENCOUNTER — Ambulatory Visit: Payer: Self-pay | Admitting: Urology

## 2014-10-27 ENCOUNTER — Other Ambulatory Visit: Payer: Self-pay | Admitting: Urology

## 2014-10-28 NOTE — Telephone Encounter (Signed)
Patient needs an office visit prior to a refill.

## 2014-10-30 NOTE — Telephone Encounter (Signed)
Spoke with pt and made aware he needed an appt before medication could be refilled. Pt voiced understanding. Pt was transferred to the front to make f/u appt.

## 2014-11-21 ENCOUNTER — Encounter: Payer: Self-pay | Admitting: Urology

## 2014-11-21 ENCOUNTER — Ambulatory Visit (INDEPENDENT_AMBULATORY_CARE_PROVIDER_SITE_OTHER): Payer: Medicare Other | Admitting: Urology

## 2014-11-21 VITALS — BP 95/62 | HR 69 | Ht 75.0 in | Wt 208.2 lb

## 2014-11-21 DIAGNOSIS — R399 Unspecified symptoms and signs involving the genitourinary system: Secondary | ICD-10-CM

## 2014-11-21 DIAGNOSIS — R3914 Feeling of incomplete bladder emptying: Secondary | ICD-10-CM | POA: Diagnosis not present

## 2014-11-21 DIAGNOSIS — R3911 Hesitancy of micturition: Secondary | ICD-10-CM

## 2014-11-21 DIAGNOSIS — N401 Enlarged prostate with lower urinary tract symptoms: Secondary | ICD-10-CM

## 2014-11-21 DIAGNOSIS — I639 Cerebral infarction, unspecified: Secondary | ICD-10-CM | POA: Diagnosis not present

## 2014-11-21 LAB — URINALYSIS, COMPLETE
BILIRUBIN UA: POSITIVE — AB
Glucose, UA: NEGATIVE
KETONES UA: NEGATIVE
NITRITE UA: NEGATIVE
SPEC GRAV UA: 1.015 (ref 1.005–1.030)
UUROB: 1 mg/dL (ref 0.2–1.0)
pH, UA: 7 (ref 5.0–7.5)

## 2014-11-21 LAB — MICROSCOPIC EXAMINATION
RBC MICROSCOPIC, UA: NONE SEEN /HPF (ref 0–?)
WBC, UA: >30 /hpf — ABNORMAL HIGH (ref 0–?)

## 2014-11-21 LAB — BLADDER SCAN AMB NON-IMAGING: SCAN RESULT: 255

## 2014-11-21 MED ORDER — FINASTERIDE 5 MG PO TABS: 5.0000 mg | ORAL_TABLET | ORAL | Status: DC

## 2014-11-21 NOTE — Progress Notes (Signed)
11/21/2014 1:32 PM   Roger Pruitt 1950-08-31 [id number]  Referring provider: Casilda Carls, MD [address]   [place], [place] [address]  Chief Complaint  Patient presents with  . Not emtying bladder    Uti?  . refills    HPI: Patient is a 64 year old white male with a history of BPH with incomplete bladder emptying and recurrent UTIs who presents today for follow-up office visit.  Patient had been on finasteride and tamsulosin for several months without improvement in his bladder emptying and was having recurrent urinary tract infections. He underwent KTP of the prostate on 03/31/2014 with Dr. Elnoria Howard. He states since that time he has been voiding well and feels that he is emptying his bladder completely.  His major complaint today frequent urination, dysuria and nocturia 2.    He has had these symptoms for several years.  He denies any hematuria or suprapubic pain.   He currently taking tamsulosin and finasteride.  His PVR is 255 mm on today's exam. He is still having significant retention of urine. He is not having any discomfort at this time.     He also denies any recent fevers, chills, nausea or vomiting.  He has severe memory issues which complicates his care because he cannot be certain if he taking his medication daily or if he is receiving signals to urinate.  He has a family history of PCa, with his father being diagnosed with prostate cancer.     PMH: Past Medical History  Diagnosis Date  . Diabetes mellitus     Type II  . Coronary artery disease   . Sleep apnea   . Esophageal reflux   . Disc degeneration, lumbar   . Bipolar disorder   . Hyperlipidemia   . MI (myocardial infarction) 2001;2012;2012  . Arthritis   . BPD (bronchopulmonary dysplasia)   . Panic attack   . Neuropathy   . Obsessive compulsive disorder   . Carpal tunnel syndrome   . History of bladder infections   . Unspecified vitamin D deficiency   . Other dysphagia   . Iron deficiency anemia,  unspecified   . Hypertension   . Hematuria   . Elevated PSA   . History of balanitis   . Nocturia   . Urethral stricture   . Hemorrhoid   . Incomplete bladder emptying   . Urinary retention   . BPH (benign prostatic hyperplasia)   . Kidney stones     Surgical History: Past Surgical History  Procedure Laterality Date  . Tonsillectomy    . Back surgery    . Skin graft    . Carpal tunnel release  2009    left  . Cardiac catheterization  6/10;2011;Aug.2012;Oct. 2012;   . Coronary angioplasty  2011 & 2001    s/p stent  . Cardiac catheterization  05/2012    [facility]; EF 60%, patent stent in the left circumflex, moderate three-vessel disease with no flow-limiting lesions. Unchanged from most recent catheterization  . Cyst removal leg Right     Home Medications:    Medication List       This list is accurate as of: 11/21/14 11:59 PM.  Always use your most recent med list.               Alcohol Swabs 70 % Pads  Use as directed.     amLODipine 10 MG tablet  Commonly known as:  NORVASC     atorvastatin 40 MG tablet  Commonly known  as:  LIPITOR  Take 1 tablet (40 mg total) by mouth daily at 6 PM.     buPROPion 300 MG 24 hr tablet  Commonly known as:  WELLBUTRIN XL  Take 300 mg by mouth daily.     Carbamazepine 300 MG Cp12  Commonly known as:  Equetro  Take 300 mg by mouth 3 (three) times daily.     carvedilol 25 MG tablet  Commonly known as:  COREG  TAKE 1 TABLET BY MOUTH TWICE DAILY     cephALEXin 500 MG capsule  Commonly known as:  KEFLEX  Take 1 capsule (500 mg total) by mouth 2 (two) times daily.     chlorthalidone 25 MG tablet  Commonly known as:  HYGROTON     ciprofloxacin 500 MG tablet  Commonly known as:  CIPRO  Take 1 tablet (500 mg total) by mouth 2 (two) times daily.     clopidogrel 75 MG tablet  Commonly known as:  PLAVIX  Take 75 mg by mouth daily.     doxepin 25 MG capsule  Commonly known as:  SINEQUAN  Take 50 mg by mouth at bedtime.      finasteride 5 MG tablet  Commonly known as:  PROSCAR  Take 1 tablet (5 mg total) by mouth every morning.     gabapentin 300 MG capsule  Commonly known as:  NEURONTIN  Take 1 capsule (300 mg total) by mouth 3 (three) times daily.     hydrochlorothiazide 25 MG tablet  Commonly known as:  HYDRODIURIL  Take 25 mg by mouth every morning.     INVEGA 6 MG 24 hr tablet  Generic drug:  paliperidone     lisinopril 40 MG tablet  Commonly known as:  PRINIVIL,ZESTRIL  Take 1 tablet (40 mg total) by mouth daily.     LORazepam 1 MG tablet  Commonly known as:  ATIVAN  Take 1 mg by mouth at bedtime.     metFORMIN 1000 MG tablet  Commonly known as:  GLUCOPHAGE  Take 1,000 mg by mouth 2 (two) times daily with a meal.     nitroGLYCERIN 0.4 MG SL tablet  Commonly known as:  NITROSTAT  Place 0.4 mg under the tongue every 5 (five) minutes as needed.     ONETOUCH DELICA LANCETS 93O Misc  2 (two) times daily. Use as directed to test blood sugar 2 times a day.     ONETOUCH VERIO test strip  Generic drug:  glucose blood  2 (two) times daily. Use as directed to test blood sugar 2 times a day.     potassium chloride 10 MEQ tablet  Commonly known as:  K-DUR,KLOR-CON     potassium chloride 10 MEQ tablet  Commonly known as:  K-DUR     RANEXA 1000 MG SR tablet  Generic drug:  ranolazine  Take 1,000 mg by mouth 2 (two) times daily.     tamsulosin 0.4 MG Caps capsule  Commonly known as:  FLOMAX  Take 0.4 mg by mouth daily.     topiramate 100 MG tablet  Commonly known as:  TOPAMAX  Take 100 mg by mouth daily.     traZODone 50 MG tablet  Commonly known as:  DESYREL  Take 50 mg by mouth at bedtime.        Allergies:  Allergies  Allergen Reactions  . Morphine And Related Nausea And Vomiting  . Sulfa Antibiotics Nausea Only  . Synalgos-Dc [Aspirin-Caff-Dihydrocodeine] Itching    "Felt like 1000's  bee's were stinging me"    Family History: Family History  Problem Relation Age of  Onset  . Heart disease Mother   . Heart disease Brother   . Heart disease Brother   . Prostate cancer Father     Social History:  reports that he quit smoking about 14 years ago. His smoking use included Cigarettes. He quit after 15 years of use. He has never used smokeless tobacco. He reports that he does not drink alcohol or use illicit drugs.  ROS: UROLOGY Frequent Urination?: Yes Hard to postpone urination?: No Burning/pain with urination?: Yes Get up at night to urinate?: Yes Leakage of urine?: No Urine stream starts and stops?: No Trouble starting stream?: No Do you have to strain to urinate?: No Blood in urine?: No Urinary tract infection?: No Sexually transmitted disease?: No Injury to kidneys or bladder?: No Painful intercourse?: No Weak stream?: No Erection problems?: No Penile pain?: No  Gastrointestinal Nausea?: No Vomiting?: No Indigestion/heartburn?: Yes Diarrhea?: No Constipation?: Yes  Constitutional Fever: No Night sweats?: No Weight loss?: Yes Fatigue?: No  Skin Skin rash/lesions?: No Itching?: No  Eyes Blurred vision?: Yes Double vision?: No  Ears/Nose/Throat Sore throat?: No Sinus problems?: No  Hematologic/Lymphatic Swollen glands?: No Easy bruising?: No  Cardiovascular Leg swelling?: No Chest pain?: No  Respiratory Cough?: No Shortness of breath?: Yes  Endocrine Excessive thirst?: Yes  Musculoskeletal Back pain?: Yes Joint pain?: No  Neurological Headaches?: No Dizziness?: Yes  Psychologic Depression?: Yes Anxiety?: Yes  Physical Exam: BP 95/62 mmHg  Pulse 69  Ht 6\' 3"  (1.905 m)  Wt 208 lb 3.2 oz (94.439 kg)  BMI 26.02 kg/m2  GU: Patient with circumcised phallus.  Urethral meatus is patent.  No penile discharge. No penile lesions or rashes. Scrotum without lesions, cysts, rashes and/or edema.  Testicles are located scrotally bilaterally. No masses are appreciated in the testicles. Left and right epididymis are  normal. Rectal: Patient with  normal sphincter tone. Perineum without scarring or rashes. No rectal masses are appreciated. Prostate is approximately 40 grams, no nodules are appreciated. Seminal vesicles are normal.   Laboratory Data: Lab Results  Component Value Date   WBC 7.3 08/24/2014   HGB 11.0* 08/24/2014   HCT 31.7* 08/24/2014   MCV 90.8 08/24/2014   PLT 157 08/24/2014    Lab Results  Component Value Date   CREATININE 0.86 08/24/2014   Lab Results  Component Value Date   HGBA1C 5.4 08/24/2014   Lab Results      PSA      0.4 ng/mL  on 11/21/2014 Urinalysis    Component Value Date/Time   COLORURINE YELLOW 10/07/2014 1043   COLORURINE Yellow 02/11/2014 1716   APPEARANCEUR CLOUDY* 10/07/2014 1043   APPEARANCEUR Clear 02/11/2014 1716   LABSPEC 1.015 10/07/2014 1043   LABSPEC 1.006 02/11/2014 1716   PHURINE 6.5 10/07/2014 1043   PHURINE 7.0 02/11/2014 1716   GLUCOSEU Negative 11/21/2014 0929   GLUCOSEU Negative 02/11/2014 1716   HGBUR TRACE* 10/07/2014 1043   HGBUR Negative 02/11/2014 1716   BILIRUBINUR Positive* 11/21/2014 0929   BILIRUBINUR NEGATIVE 10/07/2014 1043   BILIRUBINUR Negative 02/11/2014 [place] 10/07/2014 1043   KETONESUR Negative 02/11/2014 [place] 10/07/2014 1043   PROTEINUR Negative 02/11/2014 1716   NITRITE Negative 11/21/2014 0929   NITRITE POSITIVE* 10/07/2014 1043   NITRITE Negative 02/11/2014 1716   LEUKOCYTESUR 3+* 11/21/2014 0929   LEUKOCYTESUR 3+* 10/07/2014 1043   LEUKOCYTESUR Negative 02/11/2014 1716  Pertinent Imaging: Results for orders placed or performed in visit on 11/21/14  Microscopic Examination  Result Value Ref Range   WBC, UA >30 (H) 0 -  5 /hpf   RBC, UA None seen 0 -  2 /hpf   Epithelial Cells (non renal) 0-10 0 - 10 /hpf   Bacteria, UA Many (A) None seen/Few  Urinalysis, Complete  Result Value Ref Range   Specific Gravity, UA 1.015 1.005 - 1.030   pH, UA 7.0 5.0 - 7.5    Color, UA Yellow Yellow   Appearance Ur Cloudy (A) Clear   Leukocytes, UA 3+ (A) Negative   Protein, UA Trace (A) Negative/Trace   Glucose, UA Negative Negative   Ketones, UA Negative Negative   RBC, UA Trace (A) Negative   Bilirubin, UA Positive (A) Negative   Urobilinogen, Ur 1.0 0.2 - 1.0 mg/dL   Nitrite, UA Negative Negative   Microscopic Examination See below:   PSA  Result Value Ref Range   Prostate Specific Ag, Serum 0.4 0.0 - 4.0 ng/mL  BLADDER SCAN AMB NON-IMAGING  Result Value Ref Range   Scan Result 255     Assessment & Plan:    1. BPH with incomplete bladder emptying:   Patient underwent KTP of the prostate on 03/31/2014. He still continues to take the tamsulosin and finasteride. His PVR is 255 mL on today's exam. He is still having significant residuals, but he is not uncomfortable.  Patient had been referred for UDS, but due to transportation and social economic issues he did not keep that appointment.  I have refilled his tamsulosin and finasteride.   He will return in 1 year for an IPSS score, PVR, DRE and PSA.  - Urinalysis, Complete - BLADDER SCAN AMB NON-IMAGING  No Follow-up on file.  Zara Council, [facility] [address], [place], [place] [address] [phone]

## 2014-11-22 LAB — PSA: PROSTATE SPECIFIC AG, SERUM: 0.4 ng/mL (ref 0.0–4.0)

## 2014-11-24 ENCOUNTER — Telehealth: Payer: Self-pay

## 2014-11-24 NOTE — Telephone Encounter (Signed)
-----   Message from Nori Riis, PA-C sent at 11/22/2014  8:54 PM EDT ----- Patient's PSA is stable.  We will see him in 12 months.  PSA to be drawn before his next appointment.

## 2014-11-24 NOTE — Telephone Encounter (Signed)
Spoke with pt in reference to PSA results. Pt voiced understanding.  

## 2014-11-29 DIAGNOSIS — R3914 Feeling of incomplete bladder emptying: Principal | ICD-10-CM

## 2014-11-29 DIAGNOSIS — N401 Enlarged prostate with lower urinary tract symptoms: Secondary | ICD-10-CM | POA: Insufficient documentation

## 2014-12-25 ENCOUNTER — Ambulatory Visit: Payer: Medicare Other | Admitting: Cardiovascular Disease

## 2015-01-16 ENCOUNTER — Telehealth: Payer: Self-pay

## 2015-01-16 NOTE — Telephone Encounter (Signed)
S/w pt to advise him that at 10/06/14 OV, amlodipine was discontinued. Continue lisinopril as directed. Pt verbalized understanding.

## 2015-01-16 NOTE — Telephone Encounter (Signed)
Pt states he received a reminder from his pharmacy regarding his Lisinopril, he is not sure if he should be taking this. Please advise.

## 2015-01-20 ENCOUNTER — Ambulatory Visit: Payer: Self-pay | Admitting: Obstetrics and Gynecology

## 2015-01-25 ENCOUNTER — Ambulatory Visit
Admission: EM | Admit: 2015-01-25 | Discharge: 2015-01-25 | Discharge status: Against Medical Advice | Payer: Medicare Other | Attending: Family Medicine | Admitting: Family Medicine

## 2015-01-25 ENCOUNTER — Encounter: Payer: Self-pay | Admitting: Emergency Medicine

## 2015-01-25 DIAGNOSIS — M5416 Radiculopathy, lumbar region: Secondary | ICD-10-CM

## 2015-01-25 DIAGNOSIS — M5440 Lumbago with sciatica, unspecified side: Secondary | ICD-10-CM

## 2015-01-25 DIAGNOSIS — G5793 Unspecified mononeuropathy of bilateral lower limbs: Secondary | ICD-10-CM

## 2015-01-25 NOTE — ED Provider Notes (Signed)
CSN: [id number]     Arrival date & time 01/25/15  1131 History   First MD Initiated Contact with Patient 01/25/15 1239     Chief Complaint  Patient presents with  . Back Pain   (Consider location/radiation/quality/duration/timing/severity/associated sxs/prior Treatment) HPI Comments: 64 yo male with a h/o chronic low back disc disease, presents with a c/o acute worsening of low back pain since last night after bending to get something from refrigerator and feeling sudden radiating pain. States pain is in the mid lower back, with numbness and weakness to both legs as well as reported numbness to the inner thigh and genital area that is new since yesterday. Denies any bowel or bladder incontinence or retention.   Patient is a 64 y.o. male presenting with back pain. The history is provided by the patient.  Back Pain   Past Medical History  Diagnosis Date  . Diabetes mellitus     Type II  . Coronary artery disease   . Sleep apnea   . Esophageal reflux   . Disc degeneration, lumbar   . Bipolar disorder ([place])   . Hyperlipidemia   . MI (myocardial infarction) ([place]) 2001;2012;2012  . Arthritis   . BPD (bronchopulmonary dysplasia)   . Panic attack   . Neuropathy ([place])   . Obsessive compulsive disorder   . Carpal tunnel syndrome   . History of bladder infections   . Unspecified vitamin D deficiency   . Other dysphagia   . Iron deficiency anemia, unspecified   . Hypertension   . Hematuria   . Elevated PSA   . History of balanitis   . Nocturia   . Urethral stricture   . Hemorrhoid   . Incomplete bladder emptying   . Urinary retention   . BPH (benign prostatic hyperplasia)   . Kidney stones    Past Surgical History  Procedure Laterality Date  . Tonsillectomy    . Back surgery    . Skin graft    . Carpal tunnel release  2009    left  . Cardiac catheterization  6/10;2011;Aug.2012;Oct. 2012;   . Coronary angioplasty  2011 & 2001    s/p stent  . Cardiac catheterization   05/2012    [facility]; EF 60%, patent stent in the left circumflex, moderate three-vessel disease with no flow-limiting lesions. Unchanged from most recent catheterization  . Cyst removal leg Right    Family History  Problem Relation Age of Onset  . Heart disease Mother   . Heart disease Brother   . Heart disease Brother   . Prostate cancer Father    Social History  Substance Use Topics  . Smoking status: Former Smoker -- 15 years    Types: Cigarettes    Quit date: 05/17/2000  . Smokeless tobacco: Never Used     Comment: quit 17 + years  . Alcohol Use: No    Review of Systems  Musculoskeletal: Positive for back pain.    Allergies  Morphine and related; Sulfa antibiotics; and Synalgos-dc  Home Medications   Prior to Admission medications   Medication Sig Start Date End Date Taking? Authorizing Provider  Alcohol Swabs 70 % PADS Use as directed. 08/09/14   Historical Provider, MD  atorvastatin (LIPITOR) 40 MG tablet Take 1 tablet (40 mg total) by mouth daily at 6 PM. Patient not taking: Reported on 11/21/2014 08/24/14   Gladstone Lighter, MD  buPROPion (WELLBUTRIN XL) 300 MG 24 hr tablet Take 300 mg by mouth daily.  05/16/12  Historical Provider, MD  Carbamazepine (EQUETRO) 300 MG CP12 Take 300 mg by mouth 3 (three) times daily.    Historical Provider, MD  carvedilol (COREG) 25 MG tablet TAKE 1 TABLET BY MOUTH TWICE DAILY 07/29/14   Wellington Hampshire, MD  cephALEXin (KEFLEX) 500 MG capsule Take 1 capsule (500 mg total) by mouth 2 (two) times daily. Patient not taking: Reported on 11/21/2014 10/09/14   Sherlene Shams, MD  chlorthalidone (HYGROTON) 25 MG tablet  11/04/14   Historical Provider, MD  ciprofloxacin (CIPRO) 500 MG tablet Take 1 tablet (500 mg total) by mouth 2 (two) times daily. Patient not taking: Reported on 11/21/2014 10/07/14   Lorin Picket, PA-C  clopidogrel (PLAVIX) 75 MG tablet Take 75 mg by mouth daily.    Historical Provider, MD  doxepin (SINEQUAN) 25 MG capsule Take 50  mg by mouth at bedtime.  12/28/12   Historical Provider, MD  finasteride (PROSCAR) 5 MG tablet Take 1 tablet (5 mg total) by mouth every morning. 11/21/14   Nori Riis, PA-C  gabapentin (NEURONTIN) 300 MG capsule Take 1 capsule (300 mg total) by mouth 3 (three) times daily. 02/06/13   Max T Hyatt, DPM  hydrochlorothiazide (HYDRODIURIL) 25 MG tablet Take 25 mg by mouth every morning.    Historical Provider, MD  INVEGA 6 MG 24 hr tablet  11/19/14   Historical Provider, MD  lisinopril (PRINIVIL,ZESTRIL) 40 MG tablet Take 1 tablet (40 mg total) by mouth daily. 10/25/13   Wellington Hampshire, MD  LORazepam (ATIVAN) 1 MG tablet Take 1 mg by mouth at bedtime.     Historical Provider, MD  metFORMIN (GLUCOPHAGE) 1000 MG tablet Take 1,000 mg by mouth 2 (two) times daily with a meal.    Historical Provider, MD  nitroGLYCERIN (NITROSTAT) 0.4 MG SL tablet Place 0.4 mg under the tongue every 5 (five) minutes as needed.    Historical Provider, MD  [facility] DELICA LANCETS 34H MISC 2 (two) times daily. Use as directed to test blood sugar 2 times a day. 06/25/14   Historical Provider, MD  Glory Rosebush VERIO test strip 2 (two) times daily. Use as directed to test blood sugar 2 times a day. 06/25/14   Historical Provider, MD  potassium chloride (K-DUR) 10 MEQ tablet  11/19/14   Historical Provider, MD  potassium chloride (K-DUR,KLOR-CON) 10 MEQ tablet  10/15/14   Historical Provider, MD  ranolazine (RANEXA) 1000 MG SR tablet Take 1,000 mg by mouth 2 (two) times daily.     Historical Provider, MD  Tamsulosin HCl (FLOMAX) 0.4 MG CAPS Take 0.4 mg by mouth daily.    Historical Provider, MD  topiramate (TOPAMAX) 100 MG tablet Take 100 mg by mouth daily. 08/06/14   Historical Provider, MD  traZODone (DESYREL) 50 MG tablet Take 50 mg by mouth at bedtime.    Historical Provider, MD   Meds Ordered and Administered this Visit  Medications - No data to display  BP 122/78 mmHg  Pulse 75  Temp(Src) 97.8 F (36.6 C) (Tympanic)  Resp  16  Ht 6\' 3"  (1.905 m)  Wt 212 lb (96.163 kg)  BMI 26.50 kg/m2  SpO2 100% No data found.   Physical Exam  Constitutional: He appears well-developed and well-nourished. No distress.  Musculoskeletal:       Lumbar back: He exhibits tenderness and bony tenderness (lumbar midline tenderness to palpation). He exhibits no swelling, no edema, no deformity, no laceration and normal pulse.  Neurological: He is alert. A sensory deficit  is present.  Bilateral lower extremity decreased (abnormal) sensation  Skin: He is not diaphoretic.  Nursing note and vitals reviewed.   ED Course  Procedures (including critical care time)  Labs Review Labs Reviewed - No data to display  Imaging Review No results found.   Visual Acuity Review  Right Eye Distance:   Left Eye Distance:   Bilateral Distance:    Right Eye Near:   Left Eye Near:    Bilateral Near:         MDM   1. Midline low back pain with sciatica, sciatica laterality unspecified   2. Lumbar radiculopathy   3. Neuropathy involving both lower extremities (HCC)    1. Due to patient's prior history of lumbar disc disease and now acute onset of symptoms including saddle anesthesia, recommend patient go to the emergency department for further emergent evaluation and possible MRI. Explained to patient possible etiologies and concerning possible diagnosis ("cauda equina syndrome") with his symptoms and recommendation for further evaluation in the ED. Patient verbalizes understanding of this, however patient states he refuses to go to ED and walked out AMA.     Norval Gable, MD 01/25/15 [phone]

## 2015-01-25 NOTE — ED Notes (Signed)
Patient states that he bent down to get something out of the refrigerator and felt pain in his lower back.

## 2015-02-02 ENCOUNTER — Other Ambulatory Visit: Payer: Self-pay | Admitting: Cardiovascular Disease

## 2015-02-04 NOTE — Progress Notes (Signed)
This encounter was created in error - please disregard.

## 2015-02-13 ENCOUNTER — Ambulatory Visit: Payer: Medicare Other | Admitting: Nurse Practitioner

## 2015-02-25 ENCOUNTER — Ambulatory Visit: Payer: Medicare Other | Admitting: Obstetrics and Gynecology

## 2015-02-26 ENCOUNTER — Ambulatory Visit (INDEPENDENT_AMBULATORY_CARE_PROVIDER_SITE_OTHER): Payer: Medicare Other | Admitting: Obstetrics and Gynecology

## 2015-02-26 ENCOUNTER — Encounter: Payer: Self-pay | Admitting: Obstetrics and Gynecology

## 2015-02-26 VITALS — BP 133/83 | HR 60 | Resp 16 | Ht 74.0 in | Wt 203.0 lb

## 2015-02-26 DIAGNOSIS — I639 Cerebral infarction, unspecified: Secondary | ICD-10-CM

## 2015-02-26 DIAGNOSIS — R300 Dysuria: Secondary | ICD-10-CM | POA: Diagnosis not present

## 2015-02-26 DIAGNOSIS — R339 Retention of urine, unspecified: Secondary | ICD-10-CM | POA: Diagnosis not present

## 2015-02-26 LAB — MICROSCOPIC EXAMINATION: RBC, UA: NONE SEEN /hpf (ref 0–?)

## 2015-02-26 LAB — URINALYSIS, COMPLETE
Bilirubin, UA: NEGATIVE
Glucose, UA: NEGATIVE
Ketones, UA: NEGATIVE
Nitrite, UA: POSITIVE — AB
PH UA: 7 (ref 5.0–7.5)
Protein, UA: NEGATIVE
RBC, UA: NEGATIVE
Specific Gravity, UA: 1.02 (ref 1.005–1.030)
Urobilinogen, Ur: 0.2 mg/dL (ref 0.2–1.0)

## 2015-02-26 NOTE — Progress Notes (Signed)
02/26/2015 1:11 PM   Roger Pruitt 18-Jun-1950 [id number]  Referring provider: Casilda Carls, MD [address]   [place], [place] [address]  Chief Complaint  Patient presents with  . Dysuria    HPI: Patient is a 64 year old white male with a history of BPH with incomplete bladder emptying and recurrent UTIs who presents today with complaints of dysuria and strong odor x 2-3 days.  Patient had been on finasteride and tamsulosin for several months without improvement in his bladder emptying and was having recurrent urinary tract infections. He underwent KTP of the prostate on 03/31/2014 with Dr. Elnoria Howard. He states since that time he has been voiding well and feels that he is emptying his bladder completely.  His major complaint today frequent urination, dysuria and nocturia 2. He has had these symptoms for several years. He denies any hematuria or suprapubic pain.   He currently taking tamsulosin and finasteride. He is not having any abdominal discomfort at this time.   He also denies any recent fevers, chills, nausea or vomiting. He has severe memory issues which complicates his care because he cannot be certain if he taking his medication daily or if he is receiving signals to urinate.  He has a family history of PCa, with his father being diagnosed with prostate cancer.   PMH: Past Medical History  Diagnosis Date  . Diabetes mellitus     Type II  . Coronary artery disease   . Sleep apnea   . Esophageal reflux   . Disc degeneration, lumbar   . Bipolar disorder ([place])   . Hyperlipidemia   . MI (myocardial infarction) ([place]) 2001;2012;2012  . Arthritis   . BPD (bronchopulmonary dysplasia)   . Panic attack   . Neuropathy ([place])   . Obsessive compulsive disorder   . Carpal tunnel syndrome   . History of bladder infections   . Unspecified vitamin D deficiency   . Other dysphagia   . Iron deficiency anemia, unspecified   . Hypertension   . Hematuria   . Elevated PSA   .  History of balanitis   . Nocturia   . Urethral stricture   . Hemorrhoid   . Incomplete bladder emptying   . Urinary retention   . BPH (benign prostatic hyperplasia)   . Kidney stones     Surgical History: Past Surgical History  Procedure Laterality Date  . Tonsillectomy    . Back surgery    . Skin graft    . Carpal tunnel release  2009    left  . Cardiac catheterization  6/10;2011;Aug.2012;Oct. 2012;   . Coronary angioplasty  2011 & 2001    s/p stent  . Cardiac catheterization  05/2012    [facility]; EF 60%, patent stent in the left circumflex, moderate three-vessel disease with no flow-limiting lesions. Unchanged from most recent catheterization  . Cyst removal leg Right     Home Medications:    Medication List       This list is accurate as of: 02/26/15  1:11 PM.  Always use your most recent med list.               Alcohol Swabs 70 % Pads  Use as directed.     amLODipine 10 MG tablet  Commonly known as:  NORVASC     atorvastatin 40 MG tablet  Commonly known as:  LIPITOR  Take 1 tablet (40 mg total) by mouth daily at 6 PM.     buPROPion 300 MG 24 hr  tablet  Commonly known as:  WELLBUTRIN XL  Take 300 mg by mouth daily.     Carbamazepine 300 MG Cp12  Commonly known as:  Equetro  Take 300 mg by mouth 3 (three) times daily.     carvedilol 25 MG tablet  Commonly known as:  COREG  TAKE 1 TABLET BY MOUTH TWICE DAILY     chlorthalidone 25 MG tablet  Commonly known as:  HYGROTON     clopidogrel 75 MG tablet  Commonly known as:  PLAVIX  Take 75 mg by mouth daily.     doxepin 25 MG capsule  Commonly known as:  SINEQUAN  Take 50 mg by mouth at bedtime.     finasteride 5 MG tablet  Commonly known as:  PROSCAR  Take 1 tablet (5 mg total) by mouth every morning.     gabapentin 300 MG capsule  Commonly known as:  NEURONTIN  Take 1 capsule (300 mg total) by mouth 3 (three) times daily.     hydrochlorothiazide 25 MG tablet  Commonly known as:  HYDRODIURIL  Take  25 mg by mouth every morning.     INVEGA 6 MG 24 hr tablet  Generic drug:  paliperidone     JANUVIA 100 MG tablet  Generic drug:  sitaGLIPtin     lisinopril 40 MG tablet  Commonly known as:  PRINIVIL,ZESTRIL  Take 1 tablet (40 mg total) by mouth daily.     LORazepam 1 MG tablet  Commonly known as:  ATIVAN  Take 1 mg by mouth at bedtime.     metFORMIN 1000 MG tablet  Commonly known as:  GLUCOPHAGE  Take 1,000 mg by mouth 2 (two) times daily with a meal.     nitroGLYCERIN 0.4 MG SL tablet  Commonly known as:  NITROSTAT  Place 0.4 mg under the tongue every 5 (five) minutes as needed.     ONETOUCH DELICA LANCETS 99991111 Misc  2 (two) times daily. Use as directed to test blood sugar 2 times a day.     ONETOUCH VERIO test strip  Generic drug:  glucose blood  2 (two) times daily. Use as directed to test blood sugar 2 times a day.     potassium chloride 10 MEQ tablet  Commonly known as:  K-DUR,KLOR-CON     potassium chloride 10 MEQ tablet  Commonly known as:  K-DUR     RANEXA 1000 MG SR tablet  Generic drug:  ranolazine  Take 1,000 mg by mouth 2 (two) times daily.     tamsulosin 0.4 MG Caps capsule  Commonly known as:  FLOMAX  Take 0.4 mg by mouth daily.     topiramate 100 MG tablet  Commonly known as:  TOPAMAX  Take 100 mg by mouth daily.     traZODone 50 MG tablet  Commonly known as:  DESYREL  Take 50 mg by mouth at bedtime.        Allergies:  Allergies  Allergen Reactions  . Morphine And Related Nausea And Vomiting  . Sulfa Antibiotics Nausea Only  . Synalgos-Dc [Aspirin-Caff-Dihydrocodeine] Itching    "Felt like 1000's bee's were stinging me"    Family History: Family History  Problem Relation Age of Onset  . Heart disease Mother   . Heart disease Brother   . Heart disease Brother   . Prostate cancer Father     Social History:  reports that he quit smoking about 14 years ago. His smoking use included Cigarettes. He quit after 15 years  of use. He has  never used smokeless tobacco. He reports that he does not drink alcohol or use illicit drugs.  ROS: UROLOGY Frequent Urination?: Yes Hard to postpone urination?: No Burning/pain with urination?: No Get up at night to urinate?: Yes Leakage of urine?: Yes Urine stream starts and stops?: No Trouble starting stream?: No Do you have to strain to urinate?: No Blood in urine?: Yes Urinary tract infection?: Yes Sexually transmitted disease?: No Injury to kidneys or bladder?: No Painful intercourse?: No Weak stream?: Yes Erection problems?: No Penile pain?: No  Gastrointestinal Nausea?: No Vomiting?: No Indigestion/heartburn?: No Diarrhea?: No Constipation?: No  Constitutional Fever: No Night sweats?: No Weight loss?: No Fatigue?: No  Skin Skin rash/lesions?: No Itching?: No  Eyes Blurred vision?: No Double vision?: No  Ears/Nose/Throat Sore throat?: No Sinus problems?: No  Hematologic/Lymphatic Swollen glands?: No Easy bruising?: No  Cardiovascular Leg swelling?: No Chest pain?: No  Respiratory Cough?: No Shortness of breath?: No  Endocrine Excessive thirst?: No  Musculoskeletal Back pain?: No Joint pain?: No  Neurological Headaches?: No Dizziness?: No  Psychologic Depression?: Yes Anxiety?: No  Physical Exam: BP 133/83 mmHg  Pulse 60  Resp 16  Ht 6\' 2"  (1.88 m)  Wt 203 lb (92.08 kg)  BMI 26.05 kg/m2  Constitutional:  Alert and oriented, No acute distress. HEENT: [place] AT, moist mucus membranes.  Trachea midline, no masses. Cardiovascular: No clubbing, cyanosis, or edema. Respiratory: Normal respiratory effort, no increased work of breathing. GI: Abdomen is soft, nontender, nondistended, no abdominal masses GU: No CVA tenderness.  Skin: No rashes, bruises or suspicious lesions. Neurologic: Grossly intact, no focal deficits, moving all 4 extremities. Psychiatric: Normal mood and affect.  Laboratory Data:   Urinalysis   Pertinent  Imaging:   Assessment & Plan:    1. Dysuria- UA as above.  Specimen sent for culture.  2. Incomplete Bladder Emptying- May need to schedule a cystoscopy in the futyre for evaluation of possible continued BOO s/p KTP laser ablation of prostate. PVR initially 219mL.  Patient instructed to double void and repeat PVR 64mL. Patient advised to continue to double void at least twice daily once in the morning and once prior to going to bed at night.   There are no diagnoses linked to this encounter.  Return if symptoms worsen or fail to improve, for as scheduled.  These notes generated with voice recognition software. I apologize for typographical errors.  Herbert Moors, [facility] [address] [place], [place] [address] [phone]

## 2015-03-01 LAB — CULTURE, URINE COMPREHENSIVE

## 2015-03-02 ENCOUNTER — Telehealth: Payer: Self-pay | Admitting: Obstetrics and Gynecology

## 2015-03-02 MED ORDER — AMOXICILLIN-POT CLAVULANATE 875-125 MG PO TABS: 1.0000 | ORAL_TABLET | Freq: Two times a day (BID) | ORAL | Status: DC

## 2015-03-02 NOTE — Telephone Encounter (Signed)
Notified wife of +ucx and Augmentin prescription sent to pharmacy. She states he will be able to pick up the prescription on 12/7. Appt made for 12/21 @10 :00 for urine specimen.

## 2015-03-02 NOTE — Telephone Encounter (Signed)
LMOM

## 2015-03-02 NOTE — Telephone Encounter (Signed)
Please notify patient that his urine culture was positive for infection. I sent in a prescription of Augmentin to his pharmacy for him to take. He should return in approximately a week after he finishes his antibiotics and provide [place] with a urine another urine specimen. Thanks

## 2015-03-18 ENCOUNTER — Ambulatory Visit: Payer: Medicare Other | Admitting: Obstetrics and Gynecology

## 2015-03-31 ENCOUNTER — Ambulatory Visit
Admission: EM | Admit: 2015-03-31 | Discharge: 2015-03-31 | Disposition: A | Discharge status: Home / Self Care | Payer: Medicare Other | Attending: Family Medicine | Admitting: Family Medicine

## 2015-03-31 ENCOUNTER — Encounter: Payer: Self-pay | Admitting: Emergency Medicine

## 2015-03-31 DIAGNOSIS — N410 Acute prostatitis: Secondary | ICD-10-CM

## 2015-03-31 DIAGNOSIS — R350 Frequency of micturition: Secondary | ICD-10-CM | POA: Diagnosis not present

## 2015-03-31 DIAGNOSIS — F250 Schizoaffective disorder, bipolar type: Secondary | ICD-10-CM | POA: Diagnosis not present

## 2015-03-31 DIAGNOSIS — N390 Urinary tract infection, site not specified: Secondary | ICD-10-CM

## 2015-03-31 DIAGNOSIS — R339 Retention of urine, unspecified: Secondary | ICD-10-CM | POA: Insufficient documentation

## 2015-03-31 LAB — URINALYSIS COMPLETE WITH MICROSCOPIC (ARMC ONLY)
Bilirubin Urine: NEGATIVE
GLUCOSE, UA: NEGATIVE mg/dL
HGB URINE DIPSTICK: NEGATIVE
Ketones, ur: NEGATIVE mg/dL
Nitrite: NEGATIVE
Protein, ur: NEGATIVE mg/dL
Specific Gravity, Urine: 1.013 (ref 1.005–1.030)
pH: 6 (ref 5.0–8.0)

## 2015-03-31 MED ORDER — DOXYCYCLINE HYCLATE 100 MG PO CAPS: 100.0000 mg | ORAL_CAPSULE | Freq: Two times a day (BID) | ORAL | Status: AC

## 2015-03-31 NOTE — ED Notes (Signed)
Pt reports urinary frequency and pain  And chest congestion for 3 days.

## 2015-03-31 NOTE — Discharge Instructions (Signed)
Take medication as prescribed. Eat and drink regularly.  As discussed is very important to follow-up closely with your primary care physician. Follow-up with primary care physician in 2-3 days.   Also follow-up with your urologist in the next week. Return to urgent care or proceed to ER for fever, abdominal pain, difficulty urinating or moving bowels, new or worsening concerns.  Prostatitis The prostate gland is about the size and shape of a walnut. It is located just below your bladder. It produces one of the components of semen, which is made up of sperm and the fluids that help nourish and transport it out from the testicles. Prostatitis is inflammation of the prostate gland.  There are four types of prostatitis:  Acute bacterial prostatitis. This is the least common type of prostatitis. It starts quickly and usually is associated with a bladder infection, high fever, and shaking chills. It can occur at any age.  Chronic bacterial prostatitis. This is a persistent bacterial infection in the prostate. It usually develops from repeated acute bacterial prostatitis or acute bacterial prostatitis that was not properly treated. It can occur in men of any age but is most common in middle-aged men whose prostate has begun to enlarge. The symptoms are not as severe as those in acute bacterial prostatitis. Discomfort in the part of your body that is in front of your rectum and below your scrotum (perineum), lower abdomen, or in the head of your penis (glans) may represent your primary discomfort.  Chronic prostatitis (nonbacterial). This is the most common type of prostatitis. It is inflammation of the prostate gland that is not caused by a bacterial infection. The cause is unknown and may be associated with a viral infection or autoimmune disorder.  Prostatodynia (pelvic floor disorder). This is associated with increased muscular tone in the pelvis surrounding the prostate. CAUSES The causes of  bacterial prostatitis are bacterial infection. The causes of the other types of prostatitis are unknown.  SYMPTOMS  Symptoms can vary depending upon the type of prostatitis that exists. There can also be overlap in symptoms. Possible symptoms for each type of prostatitis are listed below. Acute Bacterial Prostatitis  Painful urination.  Fever or chills.  Muscle or joint pains.  Low back pain.  Low abdominal pain.  Inability to empty bladder completely. Chronic Bacterial Prostatitis, Chronic Nonbacterial Prostatitis, and Prostatodynia  Sudden urge to urinate.  Frequent urination.  Difficulty starting urine stream.  Weak urine stream.  Discharge from the urethra.  Dribbling after urination.  Rectal pain.  Pain in the testicles, penis, or tip of the penis.  Pain in the perineum.  Problems with sexual function.  Painful ejaculation.  Bloody semen. DIAGNOSIS  In order to diagnose prostatitis, your health care provider will ask about your symptoms. One or more urine samples will be taken and tested (urinalysis). If the urinalysis result is negative for bacteria, your health care provider may use a finger to feel your prostate (digital rectal exam). This exam helps your health care provider determine if your prostate is swollen and tender. It will also produce a specimen of semen that can be analyzed. TREATMENT  Treatment for prostatitis depends on the cause. If a bacterial infection is the cause, it can be treated with antibiotic medicine. In cases of chronic bacterial prostatitis, the use of antibiotics for up to 1 month or 6 weeks may be necessary. Your health care provider may instruct you to take sitz baths to help relieve pain. A sitz bath is a  bath of hot water in which your hips and buttocks are under water. This relaxes the pelvic floor muscles and often helps to relieve the pressure on your prostate. HOME CARE INSTRUCTIONS   Take all medicines as directed by your  health care provider.  Take sitz baths as directed by your health care provider. SEEK MEDICAL CARE IF:   Your symptoms get worse, not better.  You have a fever. SEEK IMMEDIATE MEDICAL CARE IF:   You have chills.  You feel nauseous or vomit.  You feel lightheaded or faint.  You are unable to urinate.  You have blood or blood clots in your urine. MAKE SURE YOU:  Understand these instructions.  Will watch your condition.  Will get help right away if you are not doing well or get worse.   This information is not intended to replace advice given to you by your health care provider. Make sure you discuss any questions you have with your health care provider.   Document Released: 03/11/2000 Document Revised: 04/04/2014 Document Reviewed: 10/01/2012 Elsevier Interactive Patient Education 2016 [employer]  Urinary Tract Infection Urinary tract infections (UTIs) can develop anywhere along your urinary tract. Your urinary tract is your body's drainage system for removing wastes and extra water. Your urinary tract includes two kidneys, two ureters, a bladder, and a urethra. Your kidneys are a pair of bean-shaped organs. Each kidney is about the size of your fist. They are located below your ribs, one on each side of your spine. CAUSES Infections are caused by microbes, which are microscopic organisms, including fungi, viruses, and bacteria. These organisms are so small that they can only be seen through a microscope. Bacteria are the microbes that most commonly cause UTIs. SYMPTOMS  Symptoms of UTIs may vary by age and gender of the patient and by the location of the infection. Symptoms in young women typically include a frequent and intense urge to urinate and a painful, burning feeling in the bladder or urethra during urination. Older women and men are more likely to be tired, shaky, and weak and have muscle aches and abdominal pain. A fever may mean the infection is in your kidneys.  Other symptoms of a kidney infection include pain in your back or sides below the ribs, nausea, and vomiting. DIAGNOSIS To diagnose a UTI, your caregiver will ask you about your symptoms. Your caregiver will also ask you to provide a urine sample. The urine sample will be tested for bacteria and white blood cells. White blood cells are made by your body to help fight infection. TREATMENT  Typically, UTIs can be treated with medication. Because most UTIs are caused by a bacterial infection, they usually can be treated with the use of antibiotics. The choice of antibiotic and length of treatment depend on your symptoms and the type of bacteria causing your infection. HOME CARE INSTRUCTIONS  If you were prescribed antibiotics, take them exactly as your caregiver instructs you. Finish the medication even if you feel better after you have only taken some of the medication.  Drink enough water and fluids to keep your urine clear or pale yellow.  Avoid caffeine, tea, and carbonated beverages. They tend to irritate your bladder.  Empty your bladder often. Avoid holding urine for long periods of time.  Empty your bladder before and after sexual intercourse.  After a bowel movement, women should cleanse from front to back. Use each tissue only once. SEEK MEDICAL CARE IF:   You have back pain.  You develop a fever.  Your symptoms do not begin to resolve within 3 days. SEEK IMMEDIATE MEDICAL CARE IF:   You have severe back pain or lower abdominal pain.  You develop chills.  You have nausea or vomiting.  You have continued burning or discomfort with urination. MAKE SURE YOU:   Understand these instructions.  Will watch your condition.  Will get help right away if you are not doing well or get worse.   This information is not intended to replace advice given to you by your health care provider. Make sure you discuss any questions you have with your health care provider.   Document  Released: 12/22/2004 Document Revised: 12/03/2014 Document Reviewed: 04/22/2011 Elsevier Interactive Patient Education [employer].

## 2015-03-31 NOTE — ED Provider Notes (Signed)
[facility]  ____________________________________________  Time seen: Approximately 11:54 AM  I have reviewed the triage vital signs and the nursing notes.   HISTORY  Chief Complaint Urinary Frequency   HPI Roger Pruitt is a 65 y.o. male with a history of BPH and incomplete bladder emptying and with a history of UTIs, who presents today for the complaints of urinary frequency and burning with urination for the last 2-3 days. Patient also reports feeling that his urine has a strong odor. Denies known fevers but report some intermittent chills and warm feeling. Patient reports that he has been following with the [facility] urology for the same in the past. Reports last urinary tract infection was one month ago. Last urinary tract infection was treated with oral Augmentin at the beginning of December. Patient reports that he does feel like he has been emptying his bladder fully recently. Patient reports that his last urinary tract infection was initially treated with Cipro but states that he was called an antibiotic or changed because his urine has been resistant to Cipro.  Patient reports that he has had intermittent urinary tract infections for years. Denies any blood in urine, abdominal pain or suprapubic pain. Denies back or flank pain. Denies nausea, vomiting, diarrhea. Denies recent sickness.  Denies chest pain, shortness of breath, abdominal pain, back pain, constipation, hematuria, blood in stool, blood in his toilet or abnormal colored stool.    Past Medical History  Diagnosis Date  . Diabetes mellitus     Type II  . Coronary artery disease   . Sleep apnea   . Esophageal reflux   . Disc degeneration, lumbar   . Bipolar disorder ([place])   . Hyperlipidemia   . MI (myocardial infarction) ([place]) 2001;2012;2012  . Arthritis   . BPD (bronchopulmonary dysplasia)   . Panic attack   . Neuropathy ([place])   . Obsessive compulsive disorder   . Carpal tunnel syndrome   . History  of bladder infections   . Unspecified vitamin D deficiency   . Other dysphagia   . Iron deficiency anemia, unspecified   . Hypertension   . Hematuria   . Elevated PSA   . History of balanitis   . Nocturia   . Urethral stricture   . Hemorrhoid   . Incomplete bladder emptying   . Urinary retention   . BPH (benign prostatic hyperplasia)   . Kidney stones     Patient Active Problem List   Diagnosis Date Noted  . Benign prostatic hypertrophy (BPH) with incomplete bladder emptying 11/29/2014  . Acute encephalopathy 08/23/2014  . CVA (cerebral infarction) 08/23/2014  . Hypokalemia 08/23/2014  . Leukocytosis 08/23/2014  . Acute CVA (cerebrovascular accident) ([place]) 08/23/2014  . Hypertensive urgency 01/01/2013  . Hypertension   . Chest pain 09/30/2011  . CAD (coronary artery disease) 09/30/2011  . S/P coronary artery stent placement 09/30/2011  . Hyperlipidemia 09/30/2011  . Diabetes mellitus ([place]) 09/30/2011    Past Surgical History  Procedure Laterality Date  . Tonsillectomy    . Back surgery    . Skin graft    . Carpal tunnel release  2009    left  . Cardiac catheterization  6/10;2011;Aug.2012;Oct. 2012;   . Coronary angioplasty  2011 & 2001    s/p stent  . Cardiac catheterization  05/2012    [facility]; EF 60%, patent stent in the left circumflex, moderate three-vessel disease with no flow-limiting lesions. Unchanged from most recent catheterization  . Cyst removal leg Right  Current Outpatient Rx  Name  Route  Sig  Dispense  Refill  . Alcohol Swabs 70 % PADS      Use as directed.      5   . amLODipine (NORVASC) 10 MG tablet               .           . atorvastatin (LIPITOR) 40 MG tablet   Oral   Take 1 tablet (40 mg total) by mouth daily at 6 PM.   30 tablet   1   . buPROPion (WELLBUTRIN XL) 300 MG 24 hr tablet   Oral   Take 300 mg by mouth daily.          . Carbamazepine (EQUETRO) 300 MG CP12   Oral   Take 300 mg by mouth 3 (three) times daily.          . carvedilol (COREG) 25 MG tablet      TAKE 1 TABLET BY MOUTH TWICE DAILY   60 tablet   6   . chlorthalidone (HYGROTON) 25 MG tablet               . clopidogrel (PLAVIX) 75 MG tablet   Oral   Take 75 mg by mouth daily.         Marland Kitchen doxepin (SINEQUAN) 25 MG capsule   Oral   Take 50 mg by mouth at bedtime.          . finasteride (PROSCAR) 5 MG tablet   Oral   Take 1 tablet (5 mg total) by mouth every morning.   90 tablet   3   . gabapentin (NEURONTIN) 300 MG capsule   Oral   Take 1 capsule (300 mg total) by mouth 3 (three) times daily.   90 capsule   11   . hydrochlorothiazide (HYDRODIURIL) 25 MG tablet   Oral   Take 25 mg by mouth every morning.         . INVEGA 6 MG 24 hr tablet                 Dispense as written.   Marland Kitchen JANUVIA 100 MG tablet                 Dispense as written.   Marland Kitchen lisinopril (PRINIVIL,ZESTRIL) 40 MG tablet   Oral   Take 1 tablet (40 mg total) by mouth daily.   90 tablet   3   . LORazepam (ATIVAN) 1 MG tablet   Oral   Take 1 mg by mouth at bedtime.          . metFORMIN (GLUCOPHAGE) 1000 MG tablet   Oral   Take 1,000 mg by mouth 2 (two) times daily with a meal.         . nitroGLYCERIN (NITROSTAT) 0.4 MG SL tablet   Sublingual   Place 0.4 mg under the tongue every 5 (five) minutes as needed.         Glory Rosebush DELICA LANCETS [id number] MISC      2 (two) times daily. Use as directed to test blood sugar 2 times a day.      5     Dispense as written.   Glory Rosebush VERIO test strip      2 (two) times daily. Use as directed to test blood sugar 2 times a day.      5     Dispense as written.   . potassium  chloride (K-DUR) 10 MEQ tablet               . potassium chloride (K-DUR,KLOR-CON) 10 MEQ tablet               . ranolazine (RANEXA) 1000 MG SR tablet   Oral   Take 1,000 mg by mouth 2 (two) times daily.          . Tamsulosin HCl (FLOMAX) 0.4 MG CAPS   Oral   Take 0.4 mg by mouth daily.          Marland Kitchen topiramate (TOPAMAX) 100 MG tablet   Oral   Take 100 mg by mouth daily.         . traZODone (DESYREL) 50 MG tablet   Oral   Take 50 mg by mouth at bedtime.           Allergies Morphine and related; Sulfa antibiotics; and Synalgos-dc  Family History  Problem Relation Age of Onset  . Heart disease Mother   . Heart disease Brother   . Heart disease Brother   . Prostate cancer Father     Social History Social History  Substance Use Topics  . Smoking status: Former Smoker -- 15 years    Types: Cigarettes    Quit date: 05/17/2000  . Smokeless tobacco: Never Used     Comment: quit 17 + years  . Alcohol Use: No    Review of Systems Constitutional: Positive chills  Eyes: No visual changes. ENT: No sore throat. Cardiovascular: Denies chest pain. Respiratory: Denies shortness of breath. Gastrointestinal: No abdominal pain.  No nausea, no vomiting.  No diarrhea.  No constipation. Genitourinary:Positive dysuria. Musculoskeletal: Negative for back pain. Skin: Negative for rash. Neurological: Negative for headaches, focal weakness or numbness.  10-point ROS otherwise negative.  ____________________________________________   PHYSICAL EXAM:  VITAL SIGNS: ED Triage Vitals  Enc Vitals Group     BP 03/31/15 1058 126/77 mmHg     Pulse Rate 03/31/15 1058 62     Resp 03/31/15 1058 18     Temp 03/31/15 1058 97.9 F (36.6 C)     Temp Source 03/31/15 1058 Oral     SpO2 03/31/15 1058 100 %     Weight 03/31/15 1058 203 lb (92.08 kg)     Height 03/31/15 1058 6\' 3"  (1.905 m)     Head Cir --      Peak Flow --      Pain Score --      Pain Loc --      Pain Edu? --      Excl. in [place]? --     Constitutional: Alert and oriented. Well appearing and in no acute distress. Eyes: Conjunctivae are normal. PERRL. EOMI. Head: Atraumatic.  Nose: No congestion/rhinnorhea.  Mouth/Throat: Mucous membranes are moist.  Oropharynx non-erythematous. Neck: No stridor.  No cervical  spine tenderness to palpation. Hematological/Lymphatic/Immunilogical: No cervical lymphadenopathy. Cardiovascular: Normal rate, regular rhythm. Grossly normal heart sounds.  Good peripheral circulation. Respiratory: Normal respiratory effort.  No retractions. Lungs CTAB.No wheezes, rales or rhonchi.  Gastrointestinal: Soft and nontender. No distention. Normal Bowel sounds.  No abdominal bruits. No CVA tenderness. Prostate/Rectal: with RN Stanton Kidney at bedside.  No external lesions. Prostate gently examined, Prostate moderately tender, mild enlargement. Stool normal color.  Musculoskeletal: No lower or upper extremity tenderness nor edema. Bilateral pedal pulses equal and easily palpated.  Neurologic:  Normal speech and language. No gross focal neurologic deficits are appreciated. No gait instability. Skin:  Skin is warm, dry and intact. No rash noted. Psychiatric: Mood and affect are normal. Speech and behavior are normal.  ____________________________________________   LABS (all labs ordered are listed, but only abnormal results are displayed)  Labs Reviewed  URINALYSIS COMPLETEWITH MICROSCOPIC ([place]) - Abnormal; Notable for the following:    Color, Urine YELLOW (*)    APPearance CLOUDY (*)    Leukocytes, UA 1+ (*)    Bacteria, UA FEW (*)    Squamous Epithelial / LPF 0-5 (*)    All other components within normal limits  URINE CULTURE     INITIAL IMPRESSION / ASSESSMENT AND PLAN / ED COURSE  Pertinent labs & imaging results that were available during my care of the patient were reviewed by me and considered in my medical decision making (see chart for details).  Well-appearing patient. No acute distress. Presents for the complaints of 2-3 days of urinary frequency, urinary urgency and burning with urination and also report of some strong odor to urine. Patient with a chronic history of incomplete bladder emptying with BPH and history of urinary tract infections. Patient also  reported some intermittent warm and chills. Prostate exam completed with RN at bedside with moderate prostate tenderness. Urinalysis positive for few bacteria, too numerous to count WBCs, leukocytes 1+, cloudy appearance. Will culture urinalysis.    epic utilized and on 02/26/2015 patient had urine culture that showed patient is resistant to Cipro and Levaquin. Patient also sulfa antibiotics allergic. As well as most recent urinary tract infection was treated with Augmentin. States that with current urinary tract infection as well as concern for acute prostatitis, will treat with oral doxycycline twice a day 3 weeks. Encouraged patient to follow-up this week within 2-3 days with his primary care physician. Also encouraged close follow-up with [facility] urological group and have a repeat urine within one week. Counseled regarding for any fever, abdominal pain, weakness, inability to eat or drink, worsening concerns to proceed directly to urgent care or ER.  Discussed this patient and plan of care with Dr Alveta Heimlich, who agrees with plan.   Discussed follow up with Primary care physician this week. Discussed follow up and return parameters including no resolution or any worsening concerns. Patient verbalized understanding and agreed to plan.   ____________________________________________   FINAL CLINICAL IMPRESSION(S) / ED DIAGNOSES  Final diagnoses:  Acute prostatitis  UTI (lower urinary tract infection)       Marylene Land, NP 03/31/15 1840

## 2015-04-01 ENCOUNTER — Telehealth: Payer: Self-pay | Admitting: *Deleted

## 2015-04-01 NOTE — ED Notes (Signed)
Patient called and informed that his urine culture is positive for bacteria. Patient confirmed understanding of information.

## 2015-04-02 ENCOUNTER — Encounter: Payer: Self-pay | Admitting: Emergency Medicine

## 2015-04-02 LAB — URINE CULTURE
Culture: >=100000
SPECIAL REQUESTS: NORMAL

## 2015-04-03 ENCOUNTER — Telehealth: Payer: Self-pay | Admitting: Emergency Medicine

## 2015-04-03 NOTE — ED Notes (Signed)
Patient were notified that his urine culture grew E. Coli.  Patient was instructed to continue with the Doxycycline but that Dr. Alveta Heimlich also wanted him to take Macrobid.  Macrobid 100mg  1 tablet twice a day for 7 day #14 with no refills was called into [facility] in [place] per Dr. Alveta Heimlich.  Patient and spouse both verbalized understanding that he is to take both the Doxycycline and Macrobid until finished.

## 2015-04-16 DIAGNOSIS — R05 Cough: Secondary | ICD-10-CM | POA: Diagnosis not present

## 2015-04-16 DIAGNOSIS — E119 Type 2 diabetes mellitus without complications: Secondary | ICD-10-CM | POA: Diagnosis not present

## 2015-04-16 DIAGNOSIS — I10 Essential (primary) hypertension: Secondary | ICD-10-CM | POA: Diagnosis not present

## 2015-04-16 DIAGNOSIS — M545 Low back pain: Secondary | ICD-10-CM | POA: Diagnosis not present

## 2015-04-21 DIAGNOSIS — F250 Schizoaffective disorder, bipolar type: Secondary | ICD-10-CM | POA: Diagnosis not present

## 2015-04-28 DIAGNOSIS — F250 Schizoaffective disorder, bipolar type: Secondary | ICD-10-CM | POA: Diagnosis not present

## 2015-04-29 ENCOUNTER — Encounter: Payer: Self-pay | Admitting: Urology

## 2015-04-29 ENCOUNTER — Ambulatory Visit: Payer: Self-pay | Admitting: Urology

## 2015-05-08 DIAGNOSIS — F250 Schizoaffective disorder, bipolar type: Secondary | ICD-10-CM | POA: Diagnosis not present

## 2015-05-11 ENCOUNTER — Ambulatory Visit
Admission: RE | Admit: 2015-05-11 | Discharge: 2015-05-11 | Disposition: A | Discharge status: Home / Self Care | Payer: Medicare Other | Source: Ambulatory Visit | Attending: Internal Medicine | Admitting: Internal Medicine

## 2015-05-11 ENCOUNTER — Encounter: Payer: Self-pay | Admitting: Obstetrics and Gynecology

## 2015-05-11 ENCOUNTER — Other Ambulatory Visit: Payer: Self-pay | Admitting: Internal Medicine

## 2015-05-11 ENCOUNTER — Ambulatory Visit (INDEPENDENT_AMBULATORY_CARE_PROVIDER_SITE_OTHER): Payer: Medicare Other | Admitting: Obstetrics and Gynecology

## 2015-05-11 VITALS — BP 114/71 | HR 65 | Resp 16 | Ht 74.0 in | Wt 211.8 lb

## 2015-05-11 DIAGNOSIS — M5136 Other intervertebral disc degeneration, lumbar region: Secondary | ICD-10-CM | POA: Insufficient documentation

## 2015-05-11 DIAGNOSIS — M545 Low back pain: Secondary | ICD-10-CM | POA: Insufficient documentation

## 2015-05-11 DIAGNOSIS — M47816 Spondylosis without myelopathy or radiculopathy, lumbar region: Secondary | ICD-10-CM | POA: Diagnosis not present

## 2015-05-11 DIAGNOSIS — R300 Dysuria: Secondary | ICD-10-CM | POA: Diagnosis not present

## 2015-05-11 DIAGNOSIS — M549 Dorsalgia, unspecified: Secondary | ICD-10-CM

## 2015-05-11 DIAGNOSIS — N390 Urinary tract infection, site not specified: Secondary | ICD-10-CM | POA: Diagnosis not present

## 2015-05-11 DIAGNOSIS — R339 Retention of urine, unspecified: Secondary | ICD-10-CM | POA: Diagnosis not present

## 2015-05-11 LAB — BLADDER SCAN AMB NON-IMAGING: Scan Result: 173

## 2015-05-11 NOTE — Progress Notes (Signed)
11:42 AM   Roger Pruitt 1950-06-15 [id number]  Referring provider: Casilda Carls, MD [address]   [place], [place] [address]  Chief Complaint  Patient presents with  . Dysuria    HPI: Patient is a 65 year old white male with a history of BPH with incomplete bladder emptying and recurrent UTIs who presents today with complaints of dysuria and strong odor x 2-3 days.  Patient had been on finasteride and tamsulosin for several months without improvement in his bladder emptying and was having recurrent urinary tract infections. He underwent KTP of the prostate on 03/31/2014 with Dr. Elnoria Howard. He states since that time he has been voiding well and feels that he is emptying his bladder completely.  His major complaint today frequent urination, dysuria and nocturia 2. He has had these symptoms for several years. He denies any hematuria or suprapubic pain.   He currently taking tamsulosin and finasteride. He is not having any abdominal discomfort at this time.   He also denies any recent fevers, chills, nausea or vomiting. He has severe memory issues which complicates his care because he cannot be certain if he taking his medication daily or if he is receiving signals to urinate.  He has a family history of PCa, with his father being diagnosed with prostate cancer. Most recent PSA 0.4 11/21/14  Interval History 05/11/15 Patient was most recently seen at an urgent care on 03/31/15 and treated with doxycycline for urinary tract infection.  Patient presents today reporting malodorous urine, dysuria and cloudy appearance of his urine. He denies any fevers, flank pain or gross hematuria. He is unsure of how long the symptoms have been occurring. He denies any other changes in urinary symptoms.  PMH: Past Medical History  Diagnosis Date  . Hematuria     history of gross and microscopic  . Diabetes ([place])   . GERD (gastroesophageal reflux disease)   . Varicose vein of leg   . History of  urethral stricture   . Anemia   . Vitamin D deficiency   . HLD (hyperlipidemia)   . HTN (hypertension)   . Lower urinary tract symptoms   . Acute cystitis without hematuria   . Abdominal pain   . Diabetes mellitus     Type II  . Coronary artery disease   . Sleep apnea   . Esophageal reflux   . Disc degeneration, lumbar   . Bipolar disorder ([place])   . Hyperlipidemia   . MI (myocardial infarction) ([place]) 2001;2012;2012  . Arthritis   . BPD (bronchopulmonary dysplasia)   . Panic attack   . Neuropathy ([place])   . Obsessive compulsive disorder   . Carpal tunnel syndrome   . History of bladder infections   . Unspecified vitamin D deficiency   . Other dysphagia   . Iron deficiency anemia, unspecified   . Hypertension   . Hematuria   . Elevated PSA   . History of balanitis   . Nocturia   . Urethral stricture   . Hemorrhoid   . Incomplete bladder emptying   . Urinary retention   . BPH (benign prostatic hyperplasia)   . Kidney stones     Surgical History: Past Surgical History  Procedure Laterality Date  . Coronary artery bypass graft      stent placement  . Cosmetic surgery  1967  . Back surgery      lumbar disc  . Tonsillectomy    . Back surgery    . Skin graft    .  Carpal tunnel release  2009    left  . Cardiac catheterization  6/10;2011;Aug.2012;Oct. 2012;   . Coronary angioplasty  2011 & 2001    s/p stent  . Cardiac catheterization  05/2012    [facility]; EF 60%, patent stent in the left circumflex, moderate three-vessel disease with no flow-limiting lesions. Unchanged from most recent catheterization  . Cyst removal leg Right     Home Medications:    Medication List       This list is accurate as of: 05/11/15 11:42 AM.  Always use your most recent med list.               Alcohol Swabs 70 % Pads  Use as directed.     buPROPion 300 MG 24 hr tablet  Commonly known as:  WELLBUTRIN XL     carbamazepine 200 MG Cp12 12 hr capsule  Commonly known as:  EQUETRO   Take 200 mg by mouth daily.     Carbamazepine 300 MG Cp12  Commonly known as:  Equetro  Take 300 mg by mouth 3 (three) times daily.     carvedilol 25 MG tablet  Commonly known as:  COREG  TAKE 1 TABLET BY MOUTH TWICE DAILY     chlorthalidone 25 MG tablet  Commonly known as:  HYGROTON     clopidogrel 75 MG tablet  Commonly known as:  PLAVIX  Take 75 mg by mouth daily.     doxepin 25 MG capsule  Commonly known as:  SINEQUAN  Take 50 mg by mouth at bedtime.     finasteride 5 MG tablet  Commonly known as:  PROSCAR  Take 1 tablet (5 mg total) by mouth every morning.     hydrochlorothiazide 25 MG tablet  Commonly known as:  HYDRODIURIL  Take 25 mg by mouth every morning.     INVEGA 6 MG 24 hr tablet  Generic drug:  paliperidone     INVEGA SUSTENNA 234 MG/1.5ML Susp injection  Generic drug:  paliperidone  Inject 234 mg into the muscle once.     JANUVIA 100 MG tablet  Generic drug:  sitaGLIPtin     lisinopril 40 MG tablet  Commonly known as:  PRINIVIL,ZESTRIL  Take 1 tablet (40 mg total) by mouth daily.     LORazepam 1 MG tablet  Commonly known as:  ATIVAN  Take 1 mg by mouth every 6 (six) hours as needed for anxiety.     metFORMIN 1000 MG tablet  Commonly known as:  GLUCOPHAGE  Take 1,000 mg by mouth 2 (two) times daily with a meal.     nitroGLYCERIN 0.4 MG SL tablet  Commonly known as:  NITROSTAT  Place 0.4 mg under the tongue every 5 (five) minutes as needed. Reported on [id number]     [facility] DELICA LANCETS [id number] Misc  2 (two) times daily. Use as directed to test blood sugar 2 times a day.     ONETOUCH VERIO test strip  Generic drug:  glucose blood  2 (two) times daily. Use as directed to test blood sugar 2 times a day.     pantoprazole 40 MG tablet  Commonly known as:  PROTONIX  Take 40 mg by mouth daily.     potassium chloride 10 MEQ tablet  Commonly known as:  K-DUR,KLOR-CON     RANEXA 1000 MG SR tablet  Generic drug:  ranolazine  Take 1,000 mg by  mouth 2 (two) times daily.     tamsulosin 0.4 MG Caps  capsule  Commonly known as:  FLOMAX  Take 0.4 mg by mouth daily.     topiramate 100 MG tablet  Commonly known as:  TOPAMAX  Take 100 mg by mouth daily.     traZODone 100 MG tablet  Commonly known as:  DESYREL  Take 100 mg by mouth at bedtime.        Allergies:  Allergies  Allergen Reactions  . Fioricet-Codeine  [Butalbital-Apap-Caff-Cod]     Other reaction(s): Unknown  . Morphine And Related Nausea And Vomiting  . Sulfa Antibiotics Nausea Only  . Synalgos-Dc [Aspirin-Caff-Dihydrocodeine] Itching    "Felt like 1000's bee's were stinging me"  . Synchlor A [Chlorpheniramine]     Family History: Family History  Problem Relation Age of Onset  . Heart disease    . Heart disease Mother   . Heart disease Brother   . Heart disease Brother   . Prostate cancer Father     Social History:  reports that he quit smoking about 14 years ago. His smoking use included Cigarettes. He quit after 15 years of use. He has never used smokeless tobacco. He reports that he does not drink alcohol or use illicit drugs.  ROS: UROLOGY Frequent Urination?: Yes Hard to postpone urination?: No Burning/pain with urination?: Yes Get up at night to urinate?: Yes Leakage of urine?: Yes Urine stream starts and stops?: No Trouble starting stream?: No Do you have to strain to urinate?: No Blood in urine?: No Urinary tract infection?: Yes Sexually transmitted disease?: No Injury to kidneys or bladder?: No Painful intercourse?: No Weak stream?: No Erection problems?: Yes Penile pain?: Yes  Gastrointestinal Nausea?: No Vomiting?: No Indigestion/heartburn?: No Diarrhea?: No Constipation?: Yes  Constitutional Fever: No Night sweats?: No Weight loss?: No Fatigue?: No  Skin Skin rash/lesions?: No Itching?: No  Eyes Blurred vision?: Yes Double vision?: No  Ears/Nose/Throat Sore throat?: No Sinus problems?:  Yes  Hematologic/Lymphatic Swollen glands?: No Easy bruising?: No  Cardiovascular Leg swelling?: No Chest pain?: Yes  Respiratory Cough?: No Shortness of breath?: Yes  Endocrine Excessive thirst?: Yes  Musculoskeletal Back pain?: Yes Joint pain?: Yes  Neurological Headaches?: No Dizziness?: Yes  Psychologic Depression?: Yes Anxiety?: No  Physical Exam: BP 114/71 mmHg  Pulse 65  Resp 16  Ht 6\' 2"  (1.88 m)  Wt 211 lb 12.8 oz (96.072 kg)  BMI 27.18 kg/m2  Constitutional:  Alert and oriented, No acute distress. HEENT: [place] AT, moist mucus membranes.  Trachea midline, no masses. Cardiovascular: No clubbing, cyanosis, or edema. Respiratory: Normal respiratory effort, no increased work of breathing. GI: Abdomen is soft, nontender, nondistended, no abdominal masses GU: No CVA tenderness.  Uncircumcised phallus with easily retractable foreskin, poor perineal hygiene Skin: No rashes, bruises or suspicious lesions. Neurologic: Grossly intact, no focal deficits, moving all 4 extremities. Psychiatric: Normal mood and affect.  Laboratory Data:   Urinalysis Results for orders placed or performed in visit on 05/11/15  BLADDER SCAN AMB NON-IMAGING  Result Value Ref Range   Scan Result 173 mL      Pertinent Imaging:   Assessment & Plan:    1. Recurrent urinary tract infection- UA highly suspicious for infection. Patient is uncircumcised. Good hygiene practices reviewed encouraged. Specimen sent for culture. KUB and renal ultrasound ordered for further evaluation of possible kidney stones as nidus for recurrent urinary tract infections.  2. Incomplete Bladder Emptying- May need to schedule a cystoscopy in the future for evaluation of possible continued BOO s/p KTP laser ablation of prostate.  PVR 163mL.  There are no diagnoses linked to this encounter.  Return for RUS/KUB results.  These notes generated with voice recognition software. I apologize for typographical  errors.  Herbert Moors, [facility] [address], [place], [place] [address] [phone]

## 2015-05-12 LAB — URINALYSIS, COMPLETE
BILIRUBIN UA: NEGATIVE
GLUCOSE, UA: NEGATIVE
KETONES UA: NEGATIVE
NITRITE UA: POSITIVE — AB
RBC, UA: NEGATIVE
SPEC GRAV UA: 1.015 (ref 1.005–1.030)
UUROB: 0.2 mg/dL (ref 0.2–1.0)
pH, UA: >9 — ABNORMAL HIGH (ref 5.0–7.5)

## 2015-05-12 LAB — MICROSCOPIC EXAMINATION
EPITHELIAL CELLS (NON RENAL): NONE SEEN /HPF (ref 0–10)
RBC MICROSCOPIC, UA: NONE SEEN /HPF (ref 0–?)

## 2015-05-13 LAB — CULTURE, URINE COMPREHENSIVE

## 2015-05-14 ENCOUNTER — Telehealth: Payer: Self-pay

## 2015-05-14 DIAGNOSIS — N390 Urinary tract infection, site not specified: Secondary | ICD-10-CM

## 2015-05-14 MED ORDER — AMOXICILLIN-POT CLAVULANATE 500-125 MG PO TABS: 1.0000 | ORAL_TABLET | Freq: Two times a day (BID) | ORAL | Status: DC

## 2015-05-14 NOTE — Telephone Encounter (Signed)
-----   Message from Roda Shutters, [facility] sent at 05/13/2015  3:38 PM EST ----- Please notify patient that his urine culture was positive for infection and send in a prescription for Augmentin 500 mg twice daily 7 days. Thanks

## 2015-05-14 NOTE — Telephone Encounter (Signed)
Spoke with pt and made aware of +ucx. Made aware medication has been sent to pharmacy. Pt voiced understanding.

## 2015-05-15 DIAGNOSIS — F250 Schizoaffective disorder, bipolar type: Secondary | ICD-10-CM | POA: Diagnosis not present

## 2015-05-19 DIAGNOSIS — F250 Schizoaffective disorder, bipolar type: Secondary | ICD-10-CM | POA: Diagnosis not present

## 2015-05-22 ENCOUNTER — Other Ambulatory Visit: Payer: Self-pay | Admitting: Obstetrics and Gynecology

## 2015-05-22 DIAGNOSIS — N390 Urinary tract infection, site not specified: Secondary | ICD-10-CM

## 2015-05-25 ENCOUNTER — Ambulatory Visit: Payer: Medicare Other | Admitting: Obstetrics and Gynecology

## 2015-05-27 ENCOUNTER — Encounter: Payer: Self-pay | Admitting: Obstetrics and Gynecology

## 2015-05-27 ENCOUNTER — Ambulatory Visit
Admission: RE | Admit: 2015-05-27 | Discharge: 2015-05-27 | Disposition: A | Discharge status: Home / Self Care | Payer: Medicare Other | Source: Ambulatory Visit | Attending: Obstetrics and Gynecology | Admitting: Obstetrics and Gynecology

## 2015-05-27 ENCOUNTER — Ambulatory Visit (INDEPENDENT_AMBULATORY_CARE_PROVIDER_SITE_OTHER): Payer: Medicare Other | Admitting: Obstetrics and Gynecology

## 2015-05-27 VITALS — BP 109/72 | HR 64 | Ht 75.0 in | Wt 206.2 lb

## 2015-05-27 DIAGNOSIS — N35919 Unspecified urethral stricture, male, unspecified site: Secondary | ICD-10-CM | POA: Insufficient documentation

## 2015-05-27 DIAGNOSIS — N390 Urinary tract infection, site not specified: Secondary | ICD-10-CM

## 2015-05-27 DIAGNOSIS — R310 Gross hematuria: Secondary | ICD-10-CM

## 2015-05-27 DIAGNOSIS — I2510 Atherosclerotic heart disease of native coronary artery without angina pectoris: Secondary | ICD-10-CM | POA: Insufficient documentation

## 2015-05-27 DIAGNOSIS — C4491 Basal cell carcinoma of skin, unspecified: Secondary | ICD-10-CM | POA: Insufficient documentation

## 2015-05-27 DIAGNOSIS — G4730 Sleep apnea, unspecified: Secondary | ICD-10-CM | POA: Insufficient documentation

## 2015-05-27 DIAGNOSIS — M5137 Other intervertebral disc degeneration, lumbosacral region: Secondary | ICD-10-CM | POA: Insufficient documentation

## 2015-05-27 DIAGNOSIS — K219 Gastro-esophageal reflux disease without esophagitis: Secondary | ICD-10-CM | POA: Insufficient documentation

## 2015-05-27 DIAGNOSIS — I10 Essential (primary) hypertension: Secondary | ICD-10-CM | POA: Insufficient documentation

## 2015-05-27 DIAGNOSIS — M51379 Other intervertebral disc degeneration, lumbosacral region without mention of lumbar back pain or lower extremity pain: Secondary | ICD-10-CM | POA: Insufficient documentation

## 2015-05-27 DIAGNOSIS — R195 Other fecal abnormalities: Secondary | ICD-10-CM | POA: Insufficient documentation

## 2015-05-27 DIAGNOSIS — N200 Calculus of kidney: Secondary | ICD-10-CM | POA: Diagnosis not present

## 2015-05-27 DIAGNOSIS — F319 Bipolar disorder, unspecified: Secondary | ICD-10-CM | POA: Insufficient documentation

## 2015-05-27 LAB — URINALYSIS, COMPLETE
Bilirubin, UA: NEGATIVE
Glucose, UA: NEGATIVE
Ketones, UA: NEGATIVE
Nitrite, UA: POSITIVE — AB
Specific Gravity, UA: 1.015 (ref 1.005–1.030)
Urobilinogen, Ur: 0.2 mg/dL (ref 0.2–1.0)
pH, UA: 8.5 — ABNORMAL HIGH (ref 5.0–7.5)

## 2015-05-27 LAB — MICROSCOPIC EXAMINATION
EPITHELIAL CELLS (NON RENAL): NONE SEEN /HPF (ref 0–10)
WBC, UA: >30 /hpf — AB (ref 0–?)

## 2015-05-27 LAB — BLADDER SCAN AMB NON-IMAGING: SCAN RESULT: 47

## 2015-05-27 NOTE — Progress Notes (Signed)
2:29 PM   Roger Pruitt 01/22/1951 [id number]  Referring provider: Casilda Carls, MD [address]   [place], [place] [address]  Chief Complaint  Patient presents with  . Dysuria    possible gross hematuria    HPI: Patient is a 65 year old white male with a history of BPH with incomplete bladder emptying and recurrent UTIs who presents today with complaints of dysuria and strong odor x 2-3 days.  Patient had been on finasteride and tamsulosin for several months without improvement in his bladder emptying and was having recurrent urinary tract infections. He underwent KTP of the prostate on 03/31/2014 with Dr. Elnoria Howard. He states since that time he has been voiding well and feels that he is emptying his bladder completely.  His major complaint today frequent urination, dysuria and nocturia 2. He has had these symptoms for several years. He denies any hematuria or suprapubic pain.   He currently taking tamsulosin and finasteride. He is not having any abdominal discomfort at this time.   He also denies any recent fevers, chills, nausea or vomiting. He has severe memory issues which complicates his care because he cannot be certain if he taking his medication daily or if he is receiving signals to urinate.  He has a family history of PCa, with his father being diagnosed with prostate cancer. Most recent PSA 0.4 11/21/14  Interval History 05/27/15  Patient presents today reporting malodorous urine, dysuria and cloudy appearance of his urine. He denies any fevers or flank pain. He is unsure of how long the symptoms have been occurring. He denies any other changes in urinary symptoms.  He also reports 1 episode of gross hematuria occurring this morning.  Former smoker x 30years.  PMH: Past Medical History  Diagnosis Date  . Hematuria     history of gross and microscopic  . Diabetes ([place])   . GERD (gastroesophageal reflux disease)   . Varicose vein of leg   . History of urethral  stricture   . Anemia   . Vitamin D deficiency   . HLD (hyperlipidemia)   . HTN (hypertension)   . Lower urinary tract symptoms   . Acute cystitis without hematuria   . Abdominal pain   . Diabetes mellitus     Type II  . Coronary artery disease   . Sleep apnea   . Esophageal reflux   . Disc degeneration, lumbar   . Bipolar disorder ([place])   . Hyperlipidemia   . MI (myocardial infarction) ([place]) 2001;2012;2012  . Arthritis   . BPD (bronchopulmonary dysplasia)   . Panic attack   . Neuropathy ([place])   . Obsessive compulsive disorder   . Carpal tunnel syndrome   . History of bladder infections   . Unspecified vitamin D deficiency   . Other dysphagia   . Iron deficiency anemia, unspecified   . Hypertension   . Hematuria   . Elevated PSA   . History of balanitis   . Nocturia   . Urethral stricture   . Hemorrhoid   . Incomplete bladder emptying   . Urinary retention   . BPH (benign prostatic hyperplasia)   . Kidney stones     Surgical History: Past Surgical History  Procedure Laterality Date  . Coronary artery bypass graft      stent placement  . Cosmetic surgery  1967  . Back surgery      lumbar disc  . Tonsillectomy    . Back surgery    . Skin graft    .  Carpal tunnel release  2009    left  . Cardiac catheterization  6/10;2011;Aug.2012;Oct. 2012;   . Coronary angioplasty  2011 & 2001    s/p stent  . Cardiac catheterization  05/2012    [facility]; EF 60%, patent stent in the left circumflex, moderate three-vessel disease with no flow-limiting lesions. Unchanged from most recent catheterization  . Cyst removal leg Right     Home Medications:    Medication List       This list is accurate as of: 05/27/15  2:29 PM.  Always use your most recent med list.               Alcohol Swabs 70 % Pads  Use as directed.     amoxicillin-clavulanate 500-125 MG tablet  Commonly known as:  AUGMENTIN  Take 1 tablet (500 mg total) by mouth 2 (two) times daily at 10 AM and 5 PM.      buPROPion 300 MG 24 hr tablet  Commonly known as:  WELLBUTRIN XL     carbamazepine 200 MG Cp12 12 hr capsule  Commonly known as:  EQUETRO  Take 200 mg by mouth daily.     Carbamazepine 300 MG Cp12  Commonly known as:  Equetro  Take 300 mg by mouth 3 (three) times daily.     carvedilol 25 MG tablet  Commonly known as:  COREG  TAKE 1 TABLET BY MOUTH TWICE DAILY     chlorthalidone 25 MG tablet  Commonly known as:  HYGROTON     clopidogrel 75 MG tablet  Commonly known as:  PLAVIX  Take 75 mg by mouth daily.     doxepin 25 MG capsule  Commonly known as:  SINEQUAN  Take 50 mg by mouth at bedtime.     finasteride 5 MG tablet  Commonly known as:  PROSCAR  Take 1 tablet (5 mg total) by mouth every morning.     hydrochlorothiazide 25 MG tablet  Commonly known as:  HYDRODIURIL  Take 25 mg by mouth every morning.     INVEGA 6 MG 24 hr tablet  Generic drug:  paliperidone     INVEGA SUSTENNA 234 MG/1.5ML Susp injection  Generic drug:  paliperidone  Inject 234 mg into the muscle once.     JANUVIA 100 MG tablet  Generic drug:  sitaGLIPtin     lisinopril 40 MG tablet  Commonly known as:  PRINIVIL,ZESTRIL  Take 1 tablet (40 mg total) by mouth daily.     LORazepam 1 MG tablet  Commonly known as:  ATIVAN  Take 1 mg by mouth every 6 (six) hours as needed for anxiety.     metFORMIN 1000 MG tablet  Commonly known as:  GLUCOPHAGE  Take 1,000 mg by mouth 2 (two) times daily with a meal.     nitroGLYCERIN 0.4 MG SL tablet  Commonly known as:  NITROSTAT  Place 0.4 mg under the tongue every 5 (five) minutes as needed. Reported on [id number]     [facility] DELICA LANCETS [id number] Misc  2 (two) times daily. Use as directed to test blood sugar 2 times a day.     ONETOUCH VERIO test strip  Generic drug:  glucose blood  2 (two) times daily. Use as directed to test blood sugar 2 times a day.     pantoprazole 40 MG tablet  Commonly known as:  PROTONIX  Take 40 mg by mouth daily.      potassium chloride SA 20 MEQ tablet  Commonly known  as:  K-DUR,KLOR-CON     RANEXA 1000 MG SR tablet  Generic drug:  ranolazine  Take 1,000 mg by mouth 2 (two) times daily.     tamsulosin 0.4 MG Caps capsule  Commonly known as:  FLOMAX  Take 0.4 mg by mouth daily.     topiramate 100 MG tablet  Commonly known as:  TOPAMAX  Take 100 mg by mouth daily.     traZODone 50 MG tablet  Commonly known as:  DESYREL        Allergies:  Allergies  Allergen Reactions  . Fioricet-Codeine  [Butalbital-Apap-Caff-Cod]     Other reaction(s): Unknown  . Morphine And Related Nausea And Vomiting  . Sulfa Antibiotics Nausea Only  . Synalgos-Dc [Aspirin-Caff-Dihydrocodeine] Itching    "Felt like 1000's bee's were stinging me"  . Synchlor A [Chlorpheniramine]     Family History: Family History  Problem Relation Age of Onset  . Heart disease    . Heart disease Mother   . Heart disease Brother   . Heart disease Brother   . Prostate cancer Father     Social History:  reports that he quit smoking about 15 years ago. His smoking use included Cigarettes. He quit after 15 years of use. He has never used smokeless tobacco. He reports that he does not drink alcohol or use illicit drugs.  ROS: UROLOGY Frequent Urination?: Yes Hard to postpone urination?: Yes Burning/pain with urination?: Yes Get up at night to urinate?: Yes Leakage of urine?: Yes Urine stream starts and stops?: Yes Trouble starting stream?: Yes Do you have to strain to urinate?: Yes Blood in urine?: Yes Urinary tract infection?: Yes Sexually transmitted disease?: No Injury to kidneys or bladder?: No Painful intercourse?: No Weak stream?: No Erection problems?: Yes Penile pain?: Yes  Gastrointestinal Nausea?: No Vomiting?: No Indigestion/heartburn?: No Diarrhea?: No Constipation?: Yes  Constitutional Fever: No Night sweats?: No Weight loss?: No Fatigue?: No  Skin Skin rash/lesions?: No Itching?:  No  Eyes Blurred vision?: Yes Double vision?: No  Ears/Nose/Throat Sore throat?: No Sinus problems?: Yes  Hematologic/Lymphatic Swollen glands?: No Easy bruising?: No  Cardiovascular Leg swelling?: No Chest pain?: No  Respiratory Cough?: No Shortness of breath?: No  Endocrine Excessive thirst?: Yes  Musculoskeletal Back pain?: No Joint pain?: Yes  Neurological Headaches?: Yes Dizziness?: No  Psychologic Depression?: No Anxiety?: No  Physical Exam: BP 109/72 mmHg  Pulse 64  Ht 6\' 3"  (1.905 m)  Wt 206 lb 3.2 oz (93.532 kg)  BMI 25.77 kg/m2  Constitutional:  Alert and oriented, No acute distress. HEENT: [place] AT, moist mucus membranes.  Trachea midline, no masses. Cardiovascular: No clubbing, cyanosis, or edema. Respiratory: Normal respiratory effort, no increased work of breathing. GI: Abdomen is soft, nontender, nondistended, no abdominal masses GU: No CVA tenderness.  Uncircumcised phallus with easily retractable foreskin, poor perineal hygiene Skin: No rashes, bruises or suspicious lesions. Neurologic: Grossly intact, no focal deficits, moving all 4 extremities. Psychiatric: Normal mood and affect.  Laboratory Data:   Urinalysis   Pertinent Imaging: Results for orders placed or performed in visit on 05/27/15  Microscopic Examination  Result Value Ref Range   WBC, UA >30 (A) 0 -  5 /hpf   RBC, UA >30 (A) 0 -  2 /hpf   Epithelial Cells (non renal) None seen 0 - 10 /hpf   Crystals Present (A) N/A   Crystal Type Amorphous Sediment N/A   Bacteria, UA Few None seen/Few  Urinalysis, Complete  Result Value Ref Range  Specific Gravity, UA 1.015 1.005 - 1.030   pH, UA 8.5 (H) 5.0 - 7.5   Color, UA Yellow Yellow   Appearance Ur Turbid (A) Clear   Leukocytes, UA 3+ (A) Negative   Protein, UA Trace (A) Negative/Trace   Glucose, UA Negative Negative   Ketones, UA Negative Negative   RBC, UA 3+ (A) Negative   Bilirubin, UA Negative Negative    Urobilinogen, Ur 0.2 0.2 - 1.0 mg/dL   Nitrite, UA Positive (A) Negative   Microscopic Examination See below:   Bladder Scan (Post Void Residual) in office  Result Value Ref Range   Scan Result 47    CLINICAL DATA: Recurrent UTI.  EXAM: ABDOMEN - 1 VIEW  COMPARISON: CT 11/27/2013  FINDINGS: Moderate stool burden in the colon. There is a non obstructive bowel gas pattern. No supine evidence of free air. No organomegaly or suspicious calcification.No acute bony abnormality.  IMPRESSION: Moderate stool burden. No acute findings.  EXAM: RENAL / URINARY TRACT ULTRASOUND COMPLETE  COMPARISON: CT abdomen pelvis of 11/27/2013  FINDINGS: Right Kidney:  Length: 11.6 cm. No hydronephrosis is seen. There is a nonobstructing calculus in the lower pole of 6 mm in diameter.  Left Kidney:  Length: 11.9 cm. No hydronephrosis is noted. A small cyst is noted medially of 1.9 cm.  Bladder:  The urinary bladder is moderately distended with no abnormality noted.  IMPRESSION: 1. No hydronephrosis. 2. Nonobstructing 6 mm right lower pole renal calculus.  Assessment & Plan:    1. Recurrent urinary tract infection- UA highly suspicious for infection. Patient is uncircumcised. Good hygiene practices reviewed encouraged. Specimen sent for culture. Will treat pending results. KUB and renal ultrasound results reviewed. KUB remarkable for possible constipation. Renal ultrasound demonstrated no hydronephrosis and possible nonobstructing 6 mm right lower pole renal calculus.  2. Incomplete Bladder Emptying- Previous PVR 174mL - today 37mL.  3. Gross Hematuria-  One episode this morning. We discussed the differential diagnosis for hematuria including nephrolithiasis, renal or upper tract tumors, bladder stones, UTIs, or bladder tumors as well as undetermined etiologies. Per AUA guidelines, I did recommend complete hematuria evaluation including CTU, possible urine cytology, and  office cystoscopy. -CT Urogram -cystosocpy  There are no diagnoses linked to this encounter.  Return for CT Urogram results and cystosocpy.  These notes generated with voice recognition software. I apologize for typographical errors.  Herbert Moors, [facility] [address] [place], [place] [address] [phone]

## 2015-05-28 ENCOUNTER — Ambulatory Visit: Payer: Medicare Other | Admitting: Obstetrics and Gynecology

## 2015-05-28 LAB — BASIC METABOLIC PANEL
BUN/Creatinine Ratio: 16 (ref 10–22)
BUN: 15 mg/dL (ref 8–27)
CALCIUM: 9.8 mg/dL (ref 8.6–10.2)
CO2: 24 mmol/L (ref 18–29)
CREATININE: 0.94 mg/dL (ref 0.76–1.27)
Chloride: 95 mmol/L — ABNORMAL LOW (ref 96–106)
GFR calc Af Amer: 99 mL/min/{1.73_m2} (ref >59)
GFR, EST NON AFRICAN AMERICAN: 85 mL/min/{1.73_m2} (ref >59)
GLUCOSE: 113 mg/dL — AB (ref 65–99)
Potassium: 4.3 mmol/L (ref 3.5–5.2)
Sodium: 137 mmol/L (ref 134–144)

## 2015-05-29 DIAGNOSIS — F250 Schizoaffective disorder, bipolar type: Secondary | ICD-10-CM | POA: Diagnosis not present

## 2015-05-30 LAB — CULTURE, URINE COMPREHENSIVE

## 2015-06-01 ENCOUNTER — Telehealth: Payer: Self-pay

## 2015-06-01 ENCOUNTER — Other Ambulatory Visit: Payer: Self-pay | Admitting: Obstetrics and Gynecology

## 2015-06-01 DIAGNOSIS — N390 Urinary tract infection, site not specified: Secondary | ICD-10-CM

## 2015-06-01 MED ORDER — AMOXICILLIN-POT CLAVULANATE 500-125 MG PO TABS: 1.0000 | ORAL_TABLET | Freq: Two times a day (BID) | ORAL | Status: DC

## 2015-06-01 NOTE — Telephone Encounter (Signed)
Spoke with pt in reference to +ucx. Pt voiced understanding stating he would go pick up medication.

## 2015-06-01 NOTE — Telephone Encounter (Signed)
-----   Message from Roda Shutters, [place] sent at 06/01/2015  8:35 AM EST ----- Please notify patient that his urine culture was positive for infection. I sent in prescription for Augmentin to his pharmacy.  He needs to keep his scheduled follow up appt.  thanks

## 2015-06-05 ENCOUNTER — Ambulatory Visit
Admission: RE | Admit: 2015-06-05 | Discharge: 2015-06-05 | Disposition: A | Discharge status: Home / Self Care | Payer: Medicare Other | Source: Ambulatory Visit | Attending: Obstetrics and Gynecology | Admitting: Obstetrics and Gynecology

## 2015-06-05 DIAGNOSIS — N200 Calculus of kidney: Secondary | ICD-10-CM | POA: Insufficient documentation

## 2015-06-05 DIAGNOSIS — I700 Atherosclerosis of aorta: Secondary | ICD-10-CM | POA: Insufficient documentation

## 2015-06-05 DIAGNOSIS — N390 Urinary tract infection, site not specified: Secondary | ICD-10-CM | POA: Insufficient documentation

## 2015-06-05 DIAGNOSIS — R918 Other nonspecific abnormal finding of lung field: Secondary | ICD-10-CM | POA: Insufficient documentation

## 2015-06-05 DIAGNOSIS — N281 Cyst of kidney, acquired: Secondary | ICD-10-CM | POA: Insufficient documentation

## 2015-06-05 DIAGNOSIS — R310 Gross hematuria: Secondary | ICD-10-CM | POA: Diagnosis not present

## 2015-06-05 MED ORDER — IOHEXOL 350 MG/ML SOLN
125.0000 mL | Freq: Once | INTRAVENOUS | Status: AC | PRN
  Administered 2015-06-05: 125 mL via INTRAVENOUS

## 2015-06-09 ENCOUNTER — Ambulatory Visit: Payer: Medicare Other | Admitting: Urology

## 2015-06-09 ENCOUNTER — Encounter: Payer: Self-pay | Admitting: Urology

## 2015-06-09 VITALS — BP 126/85 | HR 69 | Ht 75.0 in | Wt 214.3 lb

## 2015-06-09 DIAGNOSIS — R310 Gross hematuria: Secondary | ICD-10-CM

## 2015-06-09 DIAGNOSIS — N390 Urinary tract infection, site not specified: Secondary | ICD-10-CM

## 2015-06-09 LAB — MICROSCOPIC EXAMINATION: Bacteria, UA: NONE SEEN

## 2015-06-09 LAB — URINALYSIS, COMPLETE
Bilirubin, UA: NEGATIVE
Glucose, UA: NEGATIVE
Ketones, UA: NEGATIVE
Leukocytes, UA: NEGATIVE
Nitrite, UA: NEGATIVE
Protein, UA: NEGATIVE
Specific Gravity, UA: 1.015 (ref 1.005–1.030)
Urobilinogen, Ur: 0.2 mg/dL (ref 0.2–1.0)
pH, UA: 7 (ref 5.0–7.5)

## 2015-06-09 MED ORDER — CIPROFLOXACIN HCL 500 MG PO TABS
500.0000 mg | ORAL_TABLET | Freq: Once | ORAL | Status: AC
  Administered 2015-06-09: 500 mg via ORAL

## 2015-06-09 MED ORDER — LIDOCAINE HCL 2 % EX GEL
1.0000 "application " | Freq: Once | CUTANEOUS | Status: AC
  Administered 2015-06-09: 1 via URETHRAL

## 2015-06-09 NOTE — Progress Notes (Signed)
    Cystoscopy Procedure Note  Patient identification was confirmed, informed consent was obtained, and patient was prepped using Betadine solution.  Lidocaine jelly was administered per urethral meatus.    Preoperative abx where received prior to procedure.     Pre-Procedure: - Inspection reveals a normal caliber ureteral meatus.  Procedure: The flexible cystoscope was introduced without difficulty - No urethral strictures/lesions are present. - Enlarged prostate left lateral lobe regrowth - Normal bladder neck - Bilateral ureteral orifices identified - Bladder mucosa  reveals no ulcers, tumors, or lesions - No bladder stones - No trabeculation  Retroflexion shows no intravesical prostatic protrusion   Post-Procedure: - Patient tolerated the procedure well

## 2015-06-12 DIAGNOSIS — F250 Schizoaffective disorder, bipolar type: Secondary | ICD-10-CM | POA: Diagnosis not present

## 2015-06-15 ENCOUNTER — Telehealth: Payer: Self-pay

## 2015-06-15 NOTE — Telephone Encounter (Signed)
-----   Message from Nori Riis, PA-C sent at 06/15/2015 12:51 PM EDT ----- Patient is notified of his CT scan results.  He will need repeat CT scan of the chest in 3 months.  He will call Dr. Rosario Jacks for an office visit.  Would you please fax the CT results to Dr. Guerry Bruin office?

## 2015-06-15 NOTE — Telephone Encounter (Signed)
Report faxed to Dr. Guerry Bruin office

## 2015-06-24 ENCOUNTER — Other Ambulatory Visit: Payer: Self-pay

## 2015-06-24 DIAGNOSIS — N390 Urinary tract infection, site not specified: Secondary | ICD-10-CM

## 2015-06-24 DIAGNOSIS — R05 Cough: Secondary | ICD-10-CM | POA: Diagnosis not present

## 2015-06-25 ENCOUNTER — Other Ambulatory Visit: Payer: Medicare Other

## 2015-06-25 DIAGNOSIS — N390 Urinary tract infection, site not specified: Secondary | ICD-10-CM

## 2015-06-25 LAB — URINALYSIS, COMPLETE
BILIRUBIN UA: NEGATIVE
Glucose, UA: NEGATIVE
Ketones, UA: NEGATIVE
Nitrite, UA: POSITIVE — AB
PH UA: 7.5 (ref 5.0–7.5)
Protein, UA: NEGATIVE
Specific Gravity, UA: 1.015 (ref 1.005–1.030)
UUROB: 0.2 mg/dL (ref 0.2–1.0)

## 2015-06-25 LAB — MICROSCOPIC EXAMINATION
EPITHELIAL CELLS (NON RENAL): NONE SEEN /HPF (ref 0–10)
RBC MICROSCOPIC, UA: NONE SEEN /HPF (ref 0–?)

## 2015-06-26 DIAGNOSIS — F250 Schizoaffective disorder, bipolar type: Secondary | ICD-10-CM | POA: Diagnosis not present

## 2015-06-28 LAB — CULTURE, URINE COMPREHENSIVE

## 2015-07-07 DIAGNOSIS — R7889 Finding of other specified substances, not normally found in blood: Secondary | ICD-10-CM | POA: Diagnosis not present

## 2015-07-07 DIAGNOSIS — I10 Essential (primary) hypertension: Secondary | ICD-10-CM | POA: Diagnosis not present

## 2015-07-07 DIAGNOSIS — I158 Other secondary hypertension: Secondary | ICD-10-CM | POA: Diagnosis not present

## 2015-07-07 DIAGNOSIS — E784 Other hyperlipidemia: Secondary | ICD-10-CM | POA: Diagnosis not present

## 2015-07-07 DIAGNOSIS — N400 Enlarged prostate without lower urinary tract symptoms: Secondary | ICD-10-CM | POA: Diagnosis not present

## 2015-07-07 DIAGNOSIS — R739 Hyperglycemia, unspecified: Secondary | ICD-10-CM | POA: Diagnosis not present

## 2015-07-07 DIAGNOSIS — E781 Pure hyperglyceridemia: Secondary | ICD-10-CM | POA: Diagnosis not present

## 2015-07-08 DIAGNOSIS — F3132 Bipolar disorder, current episode depressed, moderate: Secondary | ICD-10-CM | POA: Diagnosis not present

## 2015-07-09 ENCOUNTER — Encounter: Payer: Self-pay | Admitting: Internal Medicine

## 2015-07-09 ENCOUNTER — Ambulatory Visit (INDEPENDENT_AMBULATORY_CARE_PROVIDER_SITE_OTHER): Payer: Medicare Other | Admitting: Internal Medicine

## 2015-07-09 VITALS — BP 110/68 | HR 67 | Ht 75.0 in | Wt 214.8 lb

## 2015-07-09 DIAGNOSIS — R918 Other nonspecific abnormal finding of lung field: Secondary | ICD-10-CM | POA: Insufficient documentation

## 2015-07-09 DIAGNOSIS — R0600 Dyspnea, unspecified: Secondary | ICD-10-CM | POA: Diagnosis not present

## 2015-07-09 DIAGNOSIS — R938 Abnormal findings on diagnostic imaging of other specified body structures: Secondary | ICD-10-CM | POA: Diagnosis not present

## 2015-07-09 DIAGNOSIS — R9389 Abnormal findings on diagnostic imaging of other specified body structures: Secondary | ICD-10-CM
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
## 2015-07-09 NOTE — Patient Instructions (Signed)
Follow up with Dr. Stevenson Clinch in: 2 months - repeat CT chest without contrast for lung nodule - PFTs and 20mwt - avoid tobacco

## 2015-07-10 DIAGNOSIS — R9389 Abnormal findings on diagnostic imaging of other specified body structures: Secondary | ICD-10-CM | POA: Insufficient documentation

## 2015-07-10 NOTE — Assessment & Plan Note (Signed)
Risk for malignancy - >28mm, irregular shape, age, smoking history, RLL 84mm irregular shape nodule in a former smoker (30 pack year) Also, 2.6x3.4cm GGO in RLL, and ill defined GGO in LLL.   Plan - CT chest w/o contrast in 2 months - avoid tobacco - PFTs and 6MWT - evaluation for possible bronchoscopy with bipsy at f/u visit.

## 2015-07-10 NOTE — Assessment & Plan Note (Signed)
Multifactorial - obesity, deconditioning, ?COPD, OSA, immobility  Plan - PFTs and 74mwt  - diet, exercise and wt loss.

## 2015-07-10 NOTE — Assessment & Plan Note (Signed)
Given hx of smoking, along with appearance and location of nodule and GGO (ground glass opacities), there is concern for malignancy (at least in early stages), however this was seen in a CT A/P.  Other differentials include - infection, inflammatory response.  Will need dedicated CT Chest with close surveillance. If worsening GGO or inc in Pulm nodule size, will then consider bronchoscopy and biopsy.  This was discussed with patient and wife, who are in agreement for f/u CT at this time.   Plan - CT chest w/o contrast in 2 months

## 2015-07-14 ENCOUNTER — Ambulatory Visit: Payer: Medicare Other | Admitting: Podiatry

## 2015-07-14 DIAGNOSIS — R7889 Finding of other specified substances, not normally found in blood: Secondary | ICD-10-CM | POA: Diagnosis not present

## 2015-07-14 DIAGNOSIS — I158 Other secondary hypertension: Secondary | ICD-10-CM | POA: Diagnosis not present

## 2015-07-20 ENCOUNTER — Emergency Department: Payer: Medicare Other

## 2015-07-20 ENCOUNTER — Other Ambulatory Visit: Payer: Self-pay

## 2015-07-20 ENCOUNTER — Inpatient Hospital Stay
Admission: EM | Admit: 2015-07-20 | Discharge: 2015-07-23 | DRG: 690 | Disposition: A | Discharge status: Home / Self Care | Payer: Medicare Other | Attending: Internal Medicine | Admitting: Internal Medicine

## 2015-07-20 DIAGNOSIS — I152 Hypertension secondary to endocrine disorders: Secondary | ICD-10-CM | POA: Diagnosis present

## 2015-07-20 DIAGNOSIS — R531 Weakness: Secondary | ICD-10-CM | POA: Diagnosis not present

## 2015-07-20 DIAGNOSIS — R42 Dizziness and giddiness: Secondary | ICD-10-CM | POA: Diagnosis not present

## 2015-07-20 DIAGNOSIS — Y92009 Unspecified place in unspecified non-institutional (private) residence as the place of occurrence of the external cause: Secondary | ICD-10-CM

## 2015-07-20 DIAGNOSIS — Z87442 Personal history of urinary calculi: Secondary | ICD-10-CM | POA: Diagnosis not present

## 2015-07-20 DIAGNOSIS — Z8249 Family history of ischemic heart disease and other diseases of the circulatory system: Secondary | ICD-10-CM

## 2015-07-20 DIAGNOSIS — Z79899 Other long term (current) drug therapy: Secondary | ICD-10-CM

## 2015-07-20 DIAGNOSIS — T07XXXA Unspecified multiple injuries, initial encounter: Secondary | ICD-10-CM

## 2015-07-20 DIAGNOSIS — M25521 Pain in right elbow: Secondary | ICD-10-CM | POA: Diagnosis present

## 2015-07-20 DIAGNOSIS — Z87440 Personal history of urinary (tract) infections: Secondary | ICD-10-CM | POA: Diagnosis not present

## 2015-07-20 DIAGNOSIS — Z951 Presence of aortocoronary bypass graft: Secondary | ICD-10-CM | POA: Diagnosis not present

## 2015-07-20 DIAGNOSIS — F319 Bipolar disorder, unspecified: Secondary | ICD-10-CM | POA: Diagnosis present

## 2015-07-20 DIAGNOSIS — Z8673 Personal history of transient ischemic attack (TIA), and cerebral infarction without residual deficits: Secondary | ICD-10-CM | POA: Diagnosis not present

## 2015-07-20 DIAGNOSIS — M546 Pain in thoracic spine: Secondary | ICD-10-CM | POA: Diagnosis not present

## 2015-07-20 DIAGNOSIS — I2510 Atherosclerotic heart disease of native coronary artery without angina pectoris: Secondary | ICD-10-CM | POA: Diagnosis present

## 2015-07-20 DIAGNOSIS — Z7902 Long term (current) use of antithrombotics/antiplatelets: Secondary | ICD-10-CM | POA: Diagnosis not present

## 2015-07-20 DIAGNOSIS — E876 Hypokalemia: Secondary | ICD-10-CM | POA: Diagnosis present

## 2015-07-20 DIAGNOSIS — K219 Gastro-esophageal reflux disease without esophagitis: Secondary | ICD-10-CM | POA: Diagnosis present

## 2015-07-20 DIAGNOSIS — S300XXA Contusion of lower back and pelvis, initial encounter: Secondary | ICD-10-CM | POA: Diagnosis not present

## 2015-07-20 DIAGNOSIS — M25561 Pain in right knee: Secondary | ICD-10-CM | POA: Diagnosis present

## 2015-07-20 DIAGNOSIS — Z9861 Coronary angioplasty status: Secondary | ICD-10-CM | POA: Diagnosis not present

## 2015-07-20 DIAGNOSIS — E785 Hyperlipidemia, unspecified: Secondary | ICD-10-CM | POA: Diagnosis present

## 2015-07-20 DIAGNOSIS — S59901A Unspecified injury of right elbow, initial encounter: Secondary | ICD-10-CM | POA: Diagnosis not present

## 2015-07-20 DIAGNOSIS — T148 Other injury of unspecified body region: Secondary | ICD-10-CM | POA: Diagnosis present

## 2015-07-20 DIAGNOSIS — Z888 Allergy status to other drugs, medicaments and biological substances status: Secondary | ICD-10-CM | POA: Diagnosis not present

## 2015-07-20 DIAGNOSIS — E119 Type 2 diabetes mellitus without complications: Secondary | ICD-10-CM | POA: Diagnosis present

## 2015-07-20 DIAGNOSIS — N400 Enlarged prostate without lower urinary tract symptoms: Secondary | ICD-10-CM | POA: Diagnosis present

## 2015-07-20 DIAGNOSIS — E871 Hypo-osmolality and hyponatremia: Secondary | ICD-10-CM | POA: Diagnosis not present

## 2015-07-20 DIAGNOSIS — W19XXXA Unspecified fall, initial encounter: Secondary | ICD-10-CM

## 2015-07-20 DIAGNOSIS — I252 Old myocardial infarction: Secondary | ICD-10-CM

## 2015-07-20 DIAGNOSIS — Z87891 Personal history of nicotine dependence: Secondary | ICD-10-CM | POA: Diagnosis not present

## 2015-07-20 DIAGNOSIS — R001 Bradycardia, unspecified: Secondary | ICD-10-CM | POA: Diagnosis present

## 2015-07-20 DIAGNOSIS — E860 Dehydration: Secondary | ICD-10-CM | POA: Diagnosis present

## 2015-07-20 DIAGNOSIS — Z8042 Family history of malignant neoplasm of prostate: Secondary | ICD-10-CM | POA: Diagnosis not present

## 2015-07-20 DIAGNOSIS — R51 Headache: Secondary | ICD-10-CM | POA: Diagnosis not present

## 2015-07-20 DIAGNOSIS — W07XXXA Fall from chair, initial encounter: Secondary | ICD-10-CM | POA: Diagnosis present

## 2015-07-20 DIAGNOSIS — Z882 Allergy status to sulfonamides status: Secondary | ICD-10-CM

## 2015-07-20 DIAGNOSIS — Z885 Allergy status to narcotic agent status: Secondary | ICD-10-CM | POA: Diagnosis not present

## 2015-07-20 DIAGNOSIS — Z7984 Long term (current) use of oral hypoglycemic drugs: Secondary | ICD-10-CM

## 2015-07-20 DIAGNOSIS — N390 Urinary tract infection, site not specified: Principal | ICD-10-CM | POA: Diagnosis present

## 2015-07-20 DIAGNOSIS — S069X9A Unspecified intracranial injury with loss of consciousness of unspecified duration, initial encounter: Secondary | ICD-10-CM | POA: Diagnosis not present

## 2015-07-20 DIAGNOSIS — I10 Essential (primary) hypertension: Secondary | ICD-10-CM | POA: Diagnosis present

## 2015-07-20 LAB — CBC
HEMATOCRIT: 37.3 % — AB (ref 40.0–52.0)
HEMOGLOBIN: 13 g/dL (ref 13.0–18.0)
MCH: 31.5 pg (ref 26.0–34.0)
MCHC: 34.9 g/dL (ref 32.0–36.0)
MCV: 90.3 fL (ref 80.0–100.0)
Platelets: 199 10*3/uL (ref 150–440)
RBC: 4.13 MIL/uL — ABNORMAL LOW (ref 4.40–5.90)
RDW: 12.9 % (ref 11.5–14.5)
WBC: 6.7 10*3/uL (ref 3.8–10.6)

## 2015-07-20 LAB — BASIC METABOLIC PANEL
ANION GAP: 9 (ref 5–15)
BUN: 20 mg/dL (ref 6–20)
CO2: 27 mmol/L (ref 22–32)
Calcium: 9.3 mg/dL (ref 8.9–10.3)
Chloride: 91 mmol/L — ABNORMAL LOW (ref 101–111)
Creatinine, Ser: 0.92 mg/dL (ref 0.61–1.24)
Glucose, Bld: 109 mg/dL — ABNORMAL HIGH (ref 65–99)
POTASSIUM: 3.3 mmol/L — AB (ref 3.5–5.1)
SODIUM: 127 mmol/L — AB (ref 135–145)

## 2015-07-20 LAB — URINALYSIS COMPLETE WITH MICROSCOPIC (ARMC ONLY)
Bilirubin Urine: NEGATIVE
GLUCOSE, UA: NEGATIVE mg/dL
Ketones, ur: NEGATIVE mg/dL
NITRITE: POSITIVE — AB
Protein, ur: NEGATIVE mg/dL
SPECIFIC GRAVITY, URINE: 1.009 (ref 1.005–1.030)
pH: 8 (ref 5.0–8.0)

## 2015-07-20 MED ORDER — DEXTROSE 5 % IV SOLN
1.0000 g | Freq: Once | INTRAVENOUS | Status: AC
  Administered 2015-07-20: 1 g via INTRAVENOUS
  Filled 2015-07-20: qty 10

## 2015-07-20 MED ORDER — SODIUM CHLORIDE 0.9 % IV BOLUS (SEPSIS)
1000.0000 mL | Freq: Once | INTRAVENOUS | Status: AC
  Administered 2015-07-20: 1000 mL via INTRAVENOUS

## 2015-07-20 NOTE — ED Notes (Signed)
Hospitalist at bedside 

## 2015-07-20 NOTE — ED Notes (Signed)
Patient transported to X-ray via stretcher 

## 2015-07-20 NOTE — ED Notes (Signed)
Pt comes into the ED via EMS from home with c/o multiple falls over the past 2 weeks, states fell today and abrasion to the right FA, pt c/o right knee pain.. States he is having dizziness with generalized weakness.Marland Kitchen

## 2015-07-20 NOTE — ED Provider Notes (Signed)
[facility] Emergency Department Provider Note  ____________________________________________  Time seen: Approximately 9:33 PM  I have reviewed the triage vital signs and the nursing notes.   HISTORY  Chief Complaint Fall; Dizziness; and Weakness    HPI Roger Pruitt is a 65 y.o. male with a history of recurrent hematuria and kidney stones, BPH, urinary retention, DM, HTN, presenting with fall 3. For the past week the patient has been sparing and sitting hematuria, dysuria and urinary frequency. He has not told his wife because he did not have to go to the doctor. Tonight he was preparing dinner when he stood up and became weak and lightheaded, sustaining a fall without any syncopal or loss of consciousness. He did not hit his head but he does have pain in his right knee and right elbow after the fall. He tried to get up 2 additional times and fell twice more. The patient is anticoagulated on Plavix. No fever, chills, nausea or vomiting. No flank pain or abdominal, testicular pain.    Past Medical History  Diagnosis Date  . Hematuria     history of gross and microscopic  . Diabetes ([place])   . GERD (gastroesophageal reflux disease)   . Varicose vein of leg   . History of urethral stricture   . Anemia   . Vitamin D deficiency   . HLD (hyperlipidemia)   . HTN (hypertension)   . Lower urinary tract symptoms   . Acute cystitis without hematuria   . Abdominal pain   . Diabetes mellitus     Type II  . Coronary artery disease   . Sleep apnea   . Esophageal reflux   . Disc degeneration, lumbar   . Bipolar disorder ([place])   . Hyperlipidemia   . MI (myocardial infarction) ([place]) 2001;2012;2012  . Arthritis   . BPD (bronchopulmonary dysplasia)   . Panic attack   . Neuropathy ([place])   . Obsessive compulsive disorder   . Carpal tunnel syndrome   . History of bladder infections   . Unspecified vitamin D deficiency   . Other dysphagia   . Iron deficiency  anemia, unspecified   . Hypertension   . Hematuria   . Elevated PSA   . History of balanitis   . Nocturia   . Urethral stricture   . Hemorrhoid   . Incomplete bladder emptying   . Urinary retention   . BPH (benign prostatic hyperplasia)     Patient Active Problem List   Diagnosis Date Noted  . Abnormal chest CT 07/10/2015  . Pulmonary nodules 07/09/2015  . Dyspnea 07/09/2015  . Basal cell carcinoma 05/27/2015  . Bipolar affective disorder ([place]) 05/27/2015  . CAD in native artery 05/27/2015  . Degeneration of intervertebral disc of lumbosacral region 05/27/2015  . Acid reflux 05/27/2015  . Benign essential HTN 05/27/2015  . Apnea, sleep 05/27/2015  . 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 Benign prostatic hypertrophy (BPH) with incomplete bladder emptying 11/29/2014  . Acute encephalopathy 08/23/2014  . CVA (cerebral infarction) 08/23/2014  . Hypokalemia 08/23/2014  . Leukocytosis 08/23/2014  . Acute CVA (cerebrovascular accident) ([place]) 08/23/2014  . Hypertensive urgency 01/01/2013  . Hypertension   . Chest pain 09/30/2011  . CAD (coronary artery disease) 09/30/2011  . S/P coronary artery stent placement 09/30/2011  . Hyperlipidemia 09/30/2011  . Diabetes mellitus ([place]) 09/30/2011  . H/O acute myocardial infarction 09/03/2011  . Allergic rhinitis, seasonal 09/03/2011    Past Surgical History  Procedure Laterality Date  . Coronary  artery bypass graft      stent placement  . Cosmetic surgery  1967  . Back surgery      lumbar disc  . Tonsillectomy    . Back surgery    . Skin graft    . Carpal tunnel release  2009    left  . Cardiac catheterization  6/10;2011;Aug.2012;Oct. 2012;   . Coronary angioplasty  2011 & 2001    s/p stent  . Cardiac catheterization  05/2012    [facility]; EF 60%, patent stent in the left circumflex, moderate three-vessel disease with no flow-limiting lesions. Unchanged from most recent catheterization  . Cyst removal leg Right     Current Outpatient Rx   Name  Route  Sig  Dispense  Refill  . Alcohol Swabs 70 % PADS      Use as directed.      5   . buPROPion (WELLBUTRIN XL) 300 MG 24 hr tablet               . carbamazepine (EQUETRO) 200 MG CP12 12 hr capsule   Oral   Take 200 mg by mouth daily.         . Carbamazepine (EQUETRO) 300 MG CP12   Oral   Take 300 mg by mouth 3 (three) times daily.         . carvedilol (COREG) 25 MG tablet      TAKE 1 TABLET BY MOUTH TWICE DAILY   60 tablet   6   . chlorthalidone (HYGROTON) 25 MG tablet               . clopidogrel (PLAVIX) 75 MG tablet   Oral   Take 75 mg by mouth daily.         Marland Kitchen doxepin (SINEQUAN) 25 MG capsule   Oral   Take 50 mg by mouth at bedtime.          . finasteride (PROSCAR) 5 MG tablet   Oral   Take 1 tablet (5 mg total) by mouth every morning.   90 tablet   3   . hydrochlorothiazide (HYDRODIURIL) 25 MG tablet   Oral   Take 25 mg by mouth every morning.         . INVEGA 6 MG 24 hr tablet                 Dispense as written.   Marland Kitchen JANUVIA 100 MG tablet                 Dispense as written.   Marland Kitchen lisinopril (PRINIVIL,ZESTRIL) 40 MG tablet   Oral   Take 1 tablet (40 mg total) by mouth daily.   90 tablet   3   . LORazepam (ATIVAN) 1 MG tablet   Oral   Take 1 mg by mouth every 6 (six) hours as needed for anxiety.         . metFORMIN (GLUCOPHAGE) 1000 MG tablet   Oral   Take 1,000 mg by mouth 2 (two) times daily with a meal.         . nitroGLYCERIN (NITROSTAT) 0.4 MG SL tablet   Sublingual   Place 0.4 mg under the tongue every 5 (five) minutes as needed. Reported on 06/09/2015         . ONETOUCH DELICA LANCETS 99991111 MISC      2 (two) times daily. Use as directed to test blood sugar 2 times a day.      5  Dispense as written.   Glory Rosebush VERIO test strip      2 (two) times daily. Use as directed to test blood sugar 2 times a day.      5     Dispense as written.   . paliperidone (INVEGA SUSTENNA) 234  MG/1.5ML SUSP injection   Intramuscular   Inject 234 mg into the muscle once.         . pantoprazole (PROTONIX) 40 MG tablet   Oral   Take 40 mg by mouth daily.         . potassium chloride SA (K-DUR,KLOR-CON) 20 MEQ tablet               . ranolazine (RANEXA) 1000 MG SR tablet   Oral   Take 1,000 mg by mouth 2 (two) times daily.          . Tamsulosin HCl (FLOMAX) 0.4 MG CAPS   Oral   Take 0.4 mg by mouth daily.         Marland Kitchen topiramate (TOPAMAX) 100 MG tablet   Oral   Take 100 mg by mouth daily.         . traZODone (DESYREL) 50 MG tablet                 Allergies Fioricet-codeine ; Morphine and related; Sulfa antibiotics; Synalgos-dc; and Synchlor a  Family History  Problem Relation Age of Onset  . Heart disease    . Heart disease Mother   . Heart disease Brother   . Heart disease Brother   . Prostate cancer Father     Social History Social History  Substance Use Topics  . Smoking status: Former Smoker -- 15 years    Types: Cigarettes    Quit date: 05/18/1999  . Smokeless tobacco: Never Used     Comment: quit 17 + years  . Alcohol Use: No    Review of Systems Constitutional: No fever/chills.Positive lightheadedness. Positive fall. Negative syncopal. Negative loss of consciousness. Eyes: No visual changes. No blurred or double vision. ENT: No sore throat. No congestion or rhinorrhea. Cardiovascular: Denies chest pain. Denies palpitations. Respiratory: Denies shortness of breath.  No cough. Gastrointestinal: No abdominal pain.  No nausea, no vomiting.  No diarrhea.  No constipation. Genitourinary: Positive for dysuria. Positive for hematuria and urinary frequency. Musculoskeletal: Positive for chronic midline back pain. No flank pain. Skin: Negative for rash. Neurological: Negative for headaches. No focal numbness, tingling or weakness.   10-point ROS otherwise negative.  ____________________________________________   PHYSICAL  EXAM:  VITAL SIGNS: ED Triage Vitals  Enc Vitals Group     BP 07/20/15 1840 165/98 mmHg     Pulse Rate 07/20/15 1840 84     Resp 07/20/15 1840 18     Temp 07/20/15 1840 97.6 F (36.4 C)     Temp Source 07/20/15 1840 Oral     SpO2 07/20/15 1840 97 %     Weight 07/20/15 1840 214 lb (97.07 kg)     Height 07/20/15 1840 6\' 3"  (1.905 m)     Head Cir --      Peak Flow --      Pain Score 07/20/15 1840 2     Pain Loc --      Pain Edu? --      Excl. in [place]? --     Constitutional: Alert and oriented.Chronically ill appearing but in no acute distress. Answers questions appropriately. Eyes: Conjunctivae are normal.  EOMI. No scleral icterus.  Head: Atraumatic. Nose: No congestion/rhinnorhea. Mouth/Throat: Mucous membranes are moist.  Neck: No stridor.  Supple.  No meningismus. Cardiovascular: Normal rate, regular rhythm. No murmurs, rubs or gallops.  Respiratory: Normal respiratory effort.  No accessory muscle use or retractions. Lungs CTAB.  No wheezes, rales or ronchi. Gastrointestinal: Soft, nontender and nondistended.  No guarding or rebound.  No peritoneal signs. No flank pain. Musculoskeletal: No LE edema. No right knee effusion but the patient does have pain with range of motion. Patella is in place and there is no evidence of dislocation. Full range of motion of the right hip and ankle without pain. Positive pain with range of motion of the right elbow but full range of motion is possible. No pain with range of motion of the right shoulder or right wrist. Normal right radial pulse. Normal sensation to light touch in the right upper and right lower extremities. Neurologic:  A&Ox3.  Speech is clear.  Face and smile are symmetric.  EOMI.  Moves all extremities well. Skin:  Skin is warm, dry and intact. No rash noted. Psychiatric: Mood and affect are normal. Speech and behavior are normal.  Normal judgement.  ____________________________________________   LABS (all labs ordered are  listed, but only abnormal results are displayed)  Labs Reviewed  BASIC METABOLIC PANEL - Abnormal; Notable for the following:    Sodium 127 (*)    Potassium 3.3 (*)    Chloride 91 (*)    Glucose, Bld 109 (*)    All other components within normal limits  CBC - Abnormal; Notable for the following:    RBC 4.13 (*)    HCT 37.3 (*)    All other components within normal limits  URINALYSIS COMPLETEWITH MICROSCOPIC ([facility] ONLY) - Abnormal; Notable for the following:    Color, Urine YELLOW (*)    APPearance TURBID (*)    Hgb urine dipstick 1+ (*)    Nitrite POSITIVE (*)    Leukocytes, UA 3+ (*)    Bacteria, UA RARE (*)    Squamous Epithelial / LPF 0-5 (*)    All other components within normal limits  URINE CULTURE  CBG MONITORING, ED   ____________________________________________  EKG  ED ECG REPORT I, Eula Listen, the attending physician, personally viewed and interpreted this ECG.   Date: 07/20/2015  EKG Time: 1853  Rate: 58  Rhythm: sinus bradycardia  Axis: Leftward  Intervals:none  ST&T Change: No ST elevation  ____________________________________________  RADIOLOGY  Ct Head Wo Contrast  07/20/2015  CLINICAL DATA:  Post fall on the kitchen tonight with loss of consciousness and headache. History of CVA in 2015. Patient currently on Plavix. EXAM: CT HEAD WITHOUT CONTRAST TECHNIQUE: Contiguous axial images were obtained from the base of the skull through the vertex without intravenous contrast. COMPARISON:  12/24/2014; 02/12/2014; brain MRI - 08/24/2014 FINDINGS: Similar findings of atrophy with sulcal prominence. Old lacunar infarct within the right basal ganglia. Scattered minimal periventricular hypodensities compatible microvascular ischemic disease. The gray-white differentiation is otherwise well maintained without CT evidence of acute large territory infarct. No intraparenchymal or extra-axial mass or hemorrhage. Unchanged size and configuration of the  ventricles and basilar cisterns. No midline shift. Limited visualization of the paranasal sinuses and mastoid air cells is normal. No air-fluid levels. Regional soft tissues appear normal. No displaced calvarial fracture. IMPRESSION: Similar findings of atrophy and microvascular ischemic disease without acute intracranial process. Electronically Signed   By: Sandi Mariscal M.D.   On: 07/20/2015 20:55    ____________________________________________  PROCEDURES  Procedure(s) performed: None  Critical Care performed: No ____________________________________________   INITIAL IMPRESSION / ASSESSMENT AND PLAN / ED COURSE  Pertinent labs & imaging results that were available during my care of the patient were reviewed by me and considered in my medical decision making (see chart for details).  65 y.o. male status post fall 3 with right knee and right elbow discomfort, in the setting of urinary symptoms. The patient has much of his workup completed from triage and does have hyponatremia with a urinary tract infection. The CT scan of his head does not show any acute intracranial process. I will have added x-rays of his right elbow and right knee to complete his trauma evaluation.  I will plan to initiate antibiotics given the results of his previous microbiology, IV fluids, and admission to the hospital.  ____________________________________________  FINAL CLINICAL IMPRESSION(S) / ED DIAGNOSES  Final diagnoses:  UTI (lower urinary tract infection)  Hyponatremia  Fall, initial encounter  Contusion, multiple sites      NEW MEDICATIONS STARTED DURING THIS VISIT:  New Prescriptions   No medications on file     Eula Listen, MD 07/20/15 2139

## 2015-07-20 NOTE — ED Notes (Signed)
Pt requesting to use bathroom. Pt provided with urinal, this RN informed pt if pt okay to use urinal due to fall hx, pt verbally agreed. Urinal provided to patient.

## 2015-07-20 NOTE — ED Notes (Signed)
Patient transported to CT via stretcher.

## 2015-07-20 NOTE — ED Notes (Addendum)
Pt presents to ED with c/o weakness, dizziness and fall. Pt c/o to spouse "my head feels funny" spouse reports repeated statement after falling as well.  Pt reports falling x 3 tonight around 5:10 pm tonight. Spouse reports patient fell out of kitchen chair attempted to get up and fell x 2, spouse and pt reports do not know if pt hit head, pt denies LOC. Pt is currently taking Plavix. Spouse reports noticed left corner of mouth dropping and right sided numbness since 3:30 pm, spouse reports symptoms are new onset . Spouse reports multiple falls in the past 2 weeks. Pt denies chest pain, shortness of breath, or abdominal pain. Spouse reports pt has hx of "3 mini strokes in 2015." Pt alert and oriented x 4, respirations even and unlabored, skin warm and dry. Pt placed on cardiac monitor, call bell within reach.

## 2015-07-20 NOTE — H&P (Signed)
[facility] [place] NAME: Roger Pruitt    MR#:  [id number]  DATE OF BIRTH:  [birth year]  DATE OF ADMISSION:  07/20/2015  PRIMARY CARE PHYSICIAN: Casilda Carls, MD   REQUESTING/REFERRING PHYSICIAN: Mariea Clonts, MD  CHIEF COMPLAINT:   Chief Complaint  Patient presents with  . Fall  . Dizziness  . Weakness    HISTORY OF PRESENT ILLNESS:  Roger Pruitt  is a 65 y.o. male who presents with Multiple falls at home. Patient states that he began to feel weak and somewhat lightheaded earlier this afternoon. When he got back home from shopping with his wife he stood up from his kitchen table to make dinner and fell. He denies any loss of consciousness. He then tried to get up 2 more times immediately and fell both times. At that point his wife brought him to the ED for evaluation. Workup here shows mild hyponatremia and nitrite positive UA suggestive of UTI. He denies any fever or chills or other infectious symptoms. Hospitals were called for admission  PAST MEDICAL HISTORY:   Past Medical History  Diagnosis Date  . Hematuria     history of gross and microscopic  . Diabetes ([place])   . GERD (gastroesophageal reflux disease)   . Varicose vein of leg   . History of urethral stricture   . Anemia   . Vitamin D deficiency   . HLD (hyperlipidemia)   . HTN (hypertension)   . Lower urinary tract symptoms   . Acute cystitis without hematuria   . Abdominal pain   . Diabetes mellitus     Type II  . Coronary artery disease   . Sleep apnea   . Esophageal reflux   . Disc degeneration, lumbar   . Bipolar disorder ([place])   . Hyperlipidemia   . MI (myocardial infarction) ([place]) 2001;2012;2012  . Arthritis   . BPD (bronchopulmonary dysplasia)   . Panic attack   . Neuropathy ([place])   . Obsessive compulsive disorder   . Carpal tunnel syndrome   . History of bladder infections   . Unspecified vitamin D deficiency   . Other dysphagia   . Iron deficiency  anemia, unspecified   . Hypertension   . Hematuria   . Elevated PSA   . History of balanitis   . Nocturia   . Urethral stricture   . Hemorrhoid   . Incomplete bladder emptying   . Urinary retention   . BPH (benign prostatic hyperplasia)     PAST SURGICAL HISTORY:   Past Surgical History  Procedure Laterality Date  . Coronary artery bypass graft      stent placement  . Cosmetic surgery  1967  . Back surgery      lumbar disc  . Tonsillectomy    . Back surgery    . Skin graft    . Carpal tunnel release  2009    left  . Cardiac catheterization  6/10;2011;Aug.2012;Oct. 2012;   . Coronary angioplasty  2011 & 2001    s/p stent  . Cardiac catheterization  05/2012    [facility]; EF 60%, patent stent in the left circumflex, moderate three-vessel disease with no flow-limiting lesions. Unchanged from most recent catheterization  . Cyst removal leg Right     SOCIAL HISTORY:   Social History  Substance Use Topics  . Smoking status: Former Smoker -- 15 years    Types: Cigarettes    Quit date: 05/18/1999  . Smokeless tobacco: Never  Used     Comment: quit 17 + years  . Alcohol Use: No    FAMILY HISTORY:   Family History  Problem Relation Age of Onset  . Heart disease    . Heart disease Mother   . Heart disease Brother   . Heart disease Brother   . Prostate cancer Father     DRUG ALLERGIES:   Allergies  Allergen Reactions  . Fioricet-Codeine [Butalbital-Apap-Caff-Cod] Nausea And Vomiting  . Morphine And Related Nausea And Vomiting  . Sulfa Antibiotics Nausea And Vomiting  . Synalgos-Dc [Aspirin-Caff-Dihydrocodeine] Itching and Other (See Comments)    Reaction:  Stinging   . Synchlor A [Chlorpheniramine] Rash    MEDICATIONS AT HOME:   Prior to Admission medications   Medication Sig Start Date End Date Taking? Authorizing Provider  buPROPion (WELLBUTRIN XL) 300 MG 24 hr tablet Take 300 mg by mouth daily.    Yes Historical Provider, MD  Carbamazepine (EQUETRO) 300 MG  CP12 Take 300 mg by mouth 2 (two) times daily.    Yes Historical Provider, MD  carvedilol (COREG) 25 MG tablet Take 25 mg by mouth 2 (two) times daily with a meal.   Yes Historical Provider, MD  chlorthalidone (HYGROTON) 25 MG tablet Take 25 mg by mouth daily.    Yes Historical Provider, MD  clopidogrel (PLAVIX) 75 MG tablet Take 75 mg by mouth daily.   Yes Historical Provider, MD  doxepin (SINEQUAN) 25 MG capsule Take 50 mg by mouth at bedtime.    Yes Historical Provider, MD  finasteride (PROSCAR) 5 MG tablet Take 5 mg by mouth daily.   Yes Historical Provider, MD  gabapentin (NEURONTIN) 600 MG tablet Take 600 mg by mouth 3 (three) times daily.   Yes Historical Provider, MD  hydrochlorothiazide (HYDRODIURIL) 25 MG tablet Take 25 mg by mouth daily.    Yes Historical Provider, MD  lisinopril (PRINIVIL,ZESTRIL) 40 MG tablet Take 1 tablet (40 mg total) by mouth daily. 10/25/13  Yes Wellington Hampshire, MD  LORazepam (ATIVAN) 1 MG tablet Take 1-2 mg by mouth 2 (two) times daily. Pt takes one tablet in the morning and two at bedtime.   Yes Historical Provider, MD  magnesium oxide (MAG-OX) 400 (241.3 Mg) MG tablet Take 400 mg by mouth daily.   Yes Historical Provider, MD  metFORMIN (GLUCOPHAGE) 1000 MG tablet Take 1,000 mg by mouth 2 (two) times daily with a meal.   Yes Historical Provider, MD  Multiple Vitamin (MULTIVITAMIN WITH MINERALS) TABS tablet Take 1 tablet by mouth daily.   Yes Historical Provider, MD  nitroGLYCERIN (NITROSTAT) 0.4 MG SL tablet Place 0.4 mg under the tongue every 5 (five) minutes as needed for chest pain.    Yes Historical Provider, MD  paliperidone (INVEGA) 6 MG 24 hr tablet Take 6 mg by mouth at bedtime.    Yes Historical Provider, MD  pantoprazole (PROTONIX) 40 MG tablet Take 40 mg by mouth daily.   Yes Historical Provider, MD  potassium chloride SA (K-DUR,KLOR-CON) 20 MEQ tablet Take 20 mEq by mouth 2 (two) times daily.    Yes Historical Provider, MD  ranolazine (RANEXA) 1000 MG  SR tablet Take 1,000 mg by mouth 2 (two) times daily.    Yes Historical Provider, MD  sitaGLIPtin (JANUVIA) 100 MG tablet Take 100 mg by mouth daily.   Yes Historical Provider, MD  Tamsulosin HCl (FLOMAX) 0.4 MG CAPS Take 0.4 mg by mouth daily after breakfast.    Yes Historical Provider, MD  topiramate (  TOPAMAX) 100 MG tablet Take 100 mg by mouth daily.   Yes Historical Provider, MD  traZODone (DESYREL) 50 MG tablet Take 50 mg by mouth at bedtime as needed for sleep.    Yes Historical Provider, MD    REVIEW OF SYSTEMS:  Review of Systems  Constitutional: Negative for fever, chills, weight loss and malaise/fatigue.  HENT: Negative for ear pain, hearing loss and tinnitus.   Eyes: Negative for blurred vision, double vision, pain and redness.  Respiratory: Negative for cough, hemoptysis and shortness of breath.   Cardiovascular: Negative for chest pain, palpitations, orthopnea and leg swelling.  Gastrointestinal: Negative for nausea, vomiting, abdominal pain, diarrhea and constipation.  Genitourinary: Negative for dysuria, frequency and hematuria.  Musculoskeletal: Positive for joint pain (knee and elbow pain) and falls. Negative for back pain and neck pain.  Skin:       No acne, rash, or lesions  Neurological: Negative for dizziness, tremors, focal weakness and weakness.  Endo/Heme/Allergies: Negative for polydipsia. Does not bruise/bleed easily.  Psychiatric/Behavioral: Negative for depression. The patient is not nervous/anxious and does not have insomnia.      VITAL SIGNS:   Filed Vitals:   07/20/15 1909 07/20/15 2100 07/20/15 2130 07/20/15 2211  BP:  144/92 150/98 139/83  Pulse:  57 58 59  Temp:      TempSrc:      Resp: 18 13 15    Height:      Weight:      SpO2:  96% 98% 96%   Wt Readings from Last 3 Encounters:  07/20/15 97.07 kg (214 lb)  07/09/15 97.433 kg (214 lb 12.8 oz)  06/09/15 97.206 kg (214 lb 4.8 oz)    PHYSICAL EXAMINATION:  Physical Exam  Vitals  reviewed. Constitutional: He is oriented to person, place, and time. He appears well-developed and well-nourished. No distress.  HENT:  Head: Normocephalic and atraumatic.  Dry mucous membranes  Eyes: Conjunctivae and EOM are normal. Pupils are equal, round, and reactive to light. No scleral icterus.  Neck: Normal range of motion. Neck supple. No JVD present. No thyromegaly present.  Cardiovascular: Normal rate, regular rhythm and intact distal pulses.  Exam reveals no gallop and no friction rub.   No murmur heard. Respiratory: Effort normal and breath sounds normal. No respiratory distress. He has no wheezes. He has no rales.  GI: Soft. Bowel sounds are normal. He exhibits no distension. There is no tenderness.  Musculoskeletal: Normal range of motion. He exhibits no edema.  No arthritis, no gout  Lymphadenopathy:    He has no cervical adenopathy.  Neurological: He is alert and oriented to person, place, and time. No cranial nerve deficit.  No dysarthria, no aphasia  Skin: Skin is warm and dry. No rash noted. No erythema.  Psychiatric: He has a normal mood and affect. His behavior is normal. Judgment and thought content normal.    LABORATORY PANEL:   CBC  Recent Labs Lab 07/20/15 1846  WBC 6.7  HGB 13.0  HCT 37.3*  PLT 199   ------------------------------------------------------------------------------------------------------------------  Chemistries   Recent Labs Lab 07/20/15 1846  NA 127*  K 3.3*  CL 91*  CO2 27  GLUCOSE 109*  BUN 20  CREATININE 0.92  CALCIUM 9.3   ------------------------------------------------------------------------------------------------------------------  Cardiac Enzymes No results for input(s): TROPONINI in the last 168 hours. ------------------------------------------------------------------------------------------------------------------  RADIOLOGY:  Dg Elbow Complete Right  07/20/2015  CLINICAL DATA:  Posterior right elbow pain  after falling tonight. EXAM: RIGHT ELBOW - COMPLETE 3+ VIEW COMPARISON:  None. FINDINGS: Medial epicondyle all hyperostosis. Moderately large olecranon spur. Small coronoid process spur. No fracture, dislocation or effusion. IMPRESSION: 1. No fracture, dislocation or effusion. 2. Degenerative changes. Electronically Signed   By: Claudie Revering M.D.   On: 07/20/2015 22:21   Ct Head Wo Contrast  07/20/2015  CLINICAL DATA:  Post fall on the kitchen tonight with loss of consciousness and headache. History of CVA in 2015. Patient currently on Plavix. EXAM: CT HEAD WITHOUT CONTRAST TECHNIQUE: Contiguous axial images were obtained from the base of the skull through the vertex without intravenous contrast. COMPARISON:  12/24/2014; 02/12/2014; brain MRI - 08/24/2014 FINDINGS: Similar findings of atrophy with sulcal prominence. Old lacunar infarct within the right basal ganglia. Scattered minimal periventricular hypodensities compatible microvascular ischemic disease. The gray-white differentiation is otherwise well maintained without CT evidence of acute large territory infarct. No intraparenchymal or extra-axial mass or hemorrhage. Unchanged size and configuration of the ventricles and basilar cisterns. No midline shift. Limited visualization of the paranasal sinuses and mastoid air cells is normal. No air-fluid levels. Regional soft tissues appear normal. No displaced calvarial fracture. IMPRESSION: Similar findings of atrophy and microvascular ischemic disease without acute intracranial process. Electronically Signed   By: Sandi Mariscal M.D.   On: 07/20/2015 20:55   Dg Knee Complete 4 Views Right  07/20/2015  CLINICAL DATA:  Patient with weakness and dizziness status post fall. Knee pain. Initial encounter. EXAM: RIGHT KNEE - COMPLETE 4+ VIEW COMPARISON:  None. FINDINGS: Normal anatomic alignment. No evidence for acute fracture or dislocation. Tricompartmental osteophytosis. Chondrocalcinosis. Vascular calcifications.  No joint effusion. IMPRESSION: No acute osseous abnormality. Chondrocalcinosis. Tricompartmental osteoarthritis. Electronically Signed   By: Lovey Newcomer M.D.   On: 07/20/2015 22:16    EKG:   Orders placed or performed during the hospital encounter of 07/20/15  . ED EKG  . ED EKG  . EKG 12-Lead  . EKG 12-Lead    IMPRESSION AND PLAN:  Principal Problem:   UTI (lower urinary tract infection) - IV Rocephin started in the ED, urine culture sent. We'll continue these antibiotics for now and follow up cultures and sensitivities. Active Problems:   Hyponatremia - very mildly low at 127. However, could potentially be contributing to his weakness and falls. Patient is dehydrated, so we will start with gentle hydration tonight and monitor for improvement.   CAD (coronary artery disease) - continue home meds   Diabetes mellitus ([place]) - on a scale insulin with corresponding glucose checks and carb modified diet   Hypertension - stable, continue home meds except for those which might lower his sodium further   Hyperlipidemia - continue home meds   GERD (gastroesophageal reflux disease) - home dose PPI  All the records are reviewed and case discussed with ED provider. Management plans discussed with the patient and/or family.  DVT PROPHYLAXIS: SubQ lovenox  GI PROPHYLAXIS: PPI  ADMISSION STATUS: Inpatient  CODE STATUS: Full Code Status History    Date Active Date Inactive Code Status Order ID Comments User Context   08/23/2014  4:52 PM 08/24/2014  4:09 PM Full Code [id number]  Demetrios Loll, MD Inpatient   01/01/2013  4:19 AM 01/01/2013  8:29 PM Full Code [id number]  Toy Baker, MD Inpatient      TOTAL TIME TAKING CARE OF THIS PATIENT: 45 minutes.    Jenay Morici [place] 07/20/2015, 11:09 PM  [facility]  Office  [phone]  CC: Primary care physician; Casilda Carls, MD

## 2015-07-21 LAB — GLUCOSE, CAPILLARY
GLUCOSE-CAPILLARY: 96 mg/dL (ref 65–99)
GLUCOSE-CAPILLARY: 95 mg/dL (ref 65–99)
Glucose-Capillary: 96 mg/dL (ref 65–99)
Glucose-Capillary: 196 mg/dL — ABNORMAL HIGH (ref 65–99)

## 2015-07-21 LAB — CBC
HCT: 32.9 % — ABNORMAL LOW (ref 40.0–52.0)
HEMOGLOBIN: 11.8 g/dL — AB (ref 13.0–18.0)
MCH: 32.1 pg (ref 26.0–34.0)
MCHC: 36 g/dL (ref 32.0–36.0)
MCV: 89.1 fL (ref 80.0–100.0)
Platelets: 174 10*3/uL (ref 150–440)
RBC: 3.69 MIL/uL — AB (ref 4.40–5.90)
RDW: 12.9 % (ref 11.5–14.5)
WBC: 5.6 10*3/uL (ref 3.8–10.6)

## 2015-07-21 LAB — HEMOGLOBIN A1C: HEMOGLOBIN A1C: 5.5 % (ref 4.0–6.0)

## 2015-07-21 MED ORDER — DOXEPIN HCL 50 MG PO CAPS
50.0000 mg | ORAL_CAPSULE | Freq: Every day | ORAL | Status: DC
  Administered 2015-07-21 – 2015-07-22 (×2): 50 mg via ORAL
  Filled 2015-07-21 (×3): qty 1

## 2015-07-21 MED ORDER — TRAMADOL HCL 50 MG PO TABS
50.0000 mg | ORAL_TABLET | Freq: Four times a day (QID) | ORAL | Status: DC | PRN
  Administered 2015-07-21: 02:00:00 50 mg via ORAL
  Filled 2015-07-21: qty 1

## 2015-07-21 MED ORDER — FINASTERIDE 5 MG PO TABS
5.0000 mg | ORAL_TABLET | Freq: Every day | ORAL | Status: DC
  Administered 2015-07-21 – 2015-07-23 (×3): 5 mg via ORAL
  Filled 2015-07-21 (×3): qty 1

## 2015-07-21 MED ORDER — INSULIN ASPART 100 UNIT/ML ~~LOC~~ SOLN: 0.0000 [IU] | Freq: Every day | SUBCUTANEOUS | Status: DC

## 2015-07-21 MED ORDER — CARBAMAZEPINE ER 100 MG PO TB12
300.0000 mg | ORAL_TABLET | Freq: Two times a day (BID) | ORAL | Status: DC
  Administered 2015-07-21 – 2015-07-23 (×5): 300 mg via ORAL
  Filled 2015-07-21 (×6): qty 3

## 2015-07-21 MED ORDER — SODIUM CHLORIDE 0.9% FLUSH
3.0000 mL | Freq: Two times a day (BID) | INTRAVENOUS | Status: DC
  Administered 2015-07-21 – 2015-07-23 (×5): 3 mL via INTRAVENOUS

## 2015-07-21 MED ORDER — TAMSULOSIN HCL 0.4 MG PO CAPS
0.4000 mg | ORAL_CAPSULE | Freq: Every day | ORAL | Status: DC
  Administered 2015-07-21 – 2015-07-23 (×3): 0.4 mg via ORAL
  Filled 2015-07-21 (×3): qty 1

## 2015-07-21 MED ORDER — PANTOPRAZOLE SODIUM 40 MG PO TBEC
40.0000 mg | DELAYED_RELEASE_TABLET | Freq: Every day | ORAL | Status: DC
  Administered 2015-07-21 – 2015-07-23 (×3): 40 mg via ORAL
  Filled 2015-07-21 (×3): qty 1

## 2015-07-21 MED ORDER — INSULIN ASPART 100 UNIT/ML ~~LOC~~ SOLN
0.0000 [IU] | Freq: Three times a day (TID) | SUBCUTANEOUS | Status: DC
  Administered 2015-07-21: 2 [IU] via SUBCUTANEOUS
  Administered 2015-07-22: 09:00:00 1 [IU] via SUBCUTANEOUS
  Filled 2015-07-21: qty 1
  Filled 2015-07-21: qty 2

## 2015-07-21 MED ORDER — POTASSIUM CHLORIDE CRYS ER 20 MEQ PO TBCR
40.0000 meq | EXTENDED_RELEASE_TABLET | Freq: Once | ORAL | Status: AC
  Administered 2015-07-21: 09:00:00 40 meq via ORAL
  Filled 2015-07-21: qty 2

## 2015-07-21 MED ORDER — SODIUM CHLORIDE 0.9 % IV SOLN
INTRAVENOUS | Status: DC
  Administered 2015-07-21: 01:00:00 via INTRAVENOUS

## 2015-07-21 MED ORDER — LORAZEPAM 2 MG/ML IJ SOLN
1.0000 mg | Freq: Every day | INTRAMUSCULAR | Status: DC
  Administered 2015-07-21 – 2015-07-23 (×3): 1 mg via INTRAVENOUS
  Filled 2015-07-21 (×3): qty 1

## 2015-07-21 MED ORDER — PALIPERIDONE ER 3 MG PO TB24
6.0000 mg | ORAL_TABLET | Freq: Every day | ORAL | Status: DC
  Administered 2015-07-21 – 2015-07-22 (×2): 6 mg via ORAL
  Filled 2015-07-21 (×3): qty 1

## 2015-07-21 MED ORDER — LISINOPRIL 20 MG PO TABS
40.0000 mg | ORAL_TABLET | Freq: Every day | ORAL | Status: DC
  Administered 2015-07-21 – 2015-07-23 (×3): 40 mg via ORAL
  Filled 2015-07-21 (×3): qty 2

## 2015-07-21 MED ORDER — CLOPIDOGREL BISULFATE 75 MG PO TABS
75.0000 mg | ORAL_TABLET | Freq: Every day | ORAL | Status: DC
  Administered 2015-07-21 – 2015-07-23 (×3): 75 mg via ORAL
  Filled 2015-07-21 (×3): qty 1

## 2015-07-21 MED ORDER — ONDANSETRON HCL 4 MG PO TABS: 4.0000 mg | ORAL_TABLET | Freq: Four times a day (QID) | ORAL | Status: DC | PRN

## 2015-07-21 MED ORDER — TOPIRAMATE 100 MG PO TABS
100.0000 mg | ORAL_TABLET | Freq: Every day | ORAL | Status: DC
  Administered 2015-07-21 – 2015-07-23 (×3): 100 mg via ORAL
  Filled 2015-07-21 (×3): qty 1

## 2015-07-21 MED ORDER — ENOXAPARIN SODIUM 40 MG/0.4ML ~~LOC~~ SOLN
40.0000 mg | Freq: Every day | SUBCUTANEOUS | Status: DC
  Administered 2015-07-21 – 2015-07-22 (×3): 40 mg via SUBCUTANEOUS
  Filled 2015-07-21 (×3): qty 0.4

## 2015-07-21 MED ORDER — CARVEDILOL 12.5 MG PO TABS
25.0000 mg | ORAL_TABLET | Freq: Two times a day (BID) | ORAL | Status: DC
  Administered 2015-07-21 – 2015-07-23 (×5): 25 mg via ORAL
  Filled 2015-07-21 (×5): qty 2

## 2015-07-21 MED ORDER — DEXTROSE 5 % IV SOLN
1.0000 g | INTRAVENOUS | Status: DC
  Administered 2015-07-21 – 2015-07-22 (×2): 1 g via INTRAVENOUS
  Filled 2015-07-21 (×3): qty 10

## 2015-07-21 MED ORDER — RANOLAZINE ER 500 MG PO TB12
1000.0000 mg | ORAL_TABLET | Freq: Two times a day (BID) | ORAL | Status: DC
  Administered 2015-07-21 – 2015-07-23 (×5): 1000 mg via ORAL
  Filled 2015-07-21 (×5): qty 2

## 2015-07-21 MED ORDER — MAGNESIUM SULFATE 2 GM/50ML IV SOLN
2.0000 g | Freq: Once | INTRAVENOUS | Status: AC
  Administered 2015-07-21: 2 g via INTRAVENOUS
  Filled 2015-07-21: qty 50

## 2015-07-21 MED ORDER — GABAPENTIN 600 MG PO TABS
600.0000 mg | ORAL_TABLET | Freq: Three times a day (TID) | ORAL | Status: DC
  Administered 2015-07-21 – 2015-07-23 (×6): 600 mg via ORAL
  Filled 2015-07-21 (×6): qty 1

## 2015-07-21 MED ORDER — ACETAMINOPHEN 650 MG RE SUPP: 650.0000 mg | Freq: Four times a day (QID) | RECTAL | Status: DC | PRN

## 2015-07-21 MED ORDER — ACETAMINOPHEN 325 MG PO TABS
650.0000 mg | ORAL_TABLET | Freq: Four times a day (QID) | ORAL | Status: DC | PRN
Start: 2015-07-21 — End: 2015-07-23
  Administered 2015-07-21 (×2): 650 mg via ORAL
  Filled 2015-07-21 (×2): qty 2

## 2015-07-21 MED ORDER — BUPROPION HCL ER (XL) 300 MG PO TB24
300.0000 mg | ORAL_TABLET | Freq: Every day | ORAL | Status: DC
  Administered 2015-07-21 – 2015-07-23 (×3): 300 mg via ORAL
  Filled 2015-07-21 (×3): qty 1

## 2015-07-21 MED ORDER — LORAZEPAM 2 MG PO TABS
2.0000 mg | ORAL_TABLET | Freq: Every day | ORAL | Status: DC
  Administered 2015-07-21 – 2015-07-22 (×3): 2 mg via ORAL
  Filled 2015-07-21 (×3): qty 1

## 2015-07-21 MED ORDER — ONDANSETRON HCL 4 MG/2ML IJ SOLN
4.0000 mg | Freq: Four times a day (QID) | INTRAMUSCULAR | Status: DC | PRN
Start: 2015-07-21 — End: 2015-07-23

## 2015-07-21 NOTE — Progress Notes (Signed)
Pharmacy Antibiotic Note  JHORDY FAZZINO is a 65 y.o. male admitted on 07/20/2015 with UTI.  Pharmacy has been consulted for ceftriaxone dosing.  Plan: Ceftriaxone 1 gm IV Q24H  Height: 6\' 3"  (190.5 cm) Weight: 214 lb (97.07 kg) IBW/kg (Calculated) : 84.5  Temp (24hrs), Avg:97.6 F (36.4 C), Min:97.6 F (36.4 C), Max:97.6 F (36.4 C)   Recent Labs Lab 07/20/15 1846  WBC 6.7  CREATININE 0.92    Estimated Creatinine Clearance: 97 mL/min (by C-G formula based on Cr of 0.92).    Allergies  Allergen Reactions  . Fioricet-Codeine [Butalbital-Apap-Caff-Cod] Nausea And Vomiting  . Morphine And Related Nausea And Vomiting  . Sulfa Antibiotics Nausea And Vomiting  . Synalgos-Dc [Aspirin-Caff-Dihydrocodeine] Itching and Other (See Comments)    Reaction:  Stinging   . Synchlor A [Chlorpheniramine] Rash    Thank you for allowing pharmacy to be a part of this patient's care.  Laural Benes, Pharm.D., BCPS Clinical Pharmacist 07/21/2015 12:38 AM

## 2015-07-21 NOTE — ED Notes (Signed)
Pt transported to room 101 

## 2015-07-21 NOTE — Care Management (Signed)
Presented to this facility with the diagnosis of UTI. Lives with wife, Vanetta Shawl, [phone] seen Dr. Rosario Jacks 3 weeks ago. Takes care of all basic activities of daily living himself, drives. No Home Health. Nursing assistants per [facility] and [facility]. [facility] 12:45-3:00pm 2 x week x 1 week. [facility] 12:45 -3:00pm 5 days a week and 3:15pm-4:00pm Saturday and Sunday. [place] in the past. Uses a rolling walker and cane when he thinks about it in the home, [place] x 3 yesterday. Good appetite. Shelbie Ammons RN MSN CCM Care Management [phone]

## 2015-07-21 NOTE — Progress Notes (Signed)
[facility] [place] NAME: Roger Pruitt    MRN#:  [id number]  DATE OF BIRTH:  30-Jan-1951  SUBJECTIVE:  Hospital Day: 1 day Roger Pruitt is a 65 y.o. male presenting with Fall; Dizziness; and Weakness .   Overnight events: No overnight events Interval Events: Still complains of some weakness however feels somewhat better  REVIEW OF SYSTEMS:  CONSTITUTIONAL: No fever, fatigue or weakness.  EYES: No blurred or double vision.  EARS, NOSE, AND THROAT: No tinnitus or ear pain.  RESPIRATORY: No cough, shortness of breath, wheezing or hemoptysis.  CARDIOVASCULAR: No chest pain, orthopnea, edema.  GASTROINTESTINAL: No nausea, vomiting, diarrhea or abdominal pain.  GENITOURINARY: No dysuria, hematuria.  ENDOCRINE: No polyuria, nocturia,  HEMATOLOGY: No anemia, easy bruising or bleeding SKIN: No rash or lesion. MUSCULOSKELETAL: No joint pain or arthritis.   NEUROLOGIC: No tingling, numbness, weakness.  PSYCHIATRY: No anxiety or depression.   DRUG ALLERGIES:   Allergies  Allergen Reactions  . Fioricet-Codeine [Butalbital-Apap-Caff-Cod] Nausea And Vomiting  . Morphine And Related Nausea And Vomiting  . Sulfa Antibiotics Nausea And Vomiting  . Synalgos-Dc [Aspirin-Caff-Dihydrocodeine] Itching and Other (See Comments)    Reaction:  Stinging   . Synchlor A [Chlorpheniramine] Rash    VITALS:  Blood pressure 150/82, pulse 65, temperature 97.5 F (36.4 C), temperature source Oral, resp. rate 18, height 6\' 3"  (1.905 m), weight 93.396 kg (205 lb 14.4 oz), SpO2 92 %.  PHYSICAL EXAMINATION:  VITAL SIGNS: Filed Vitals:   07/21/15 0450 07/21/15 0900  BP: 115/71 150/82  Pulse: 70 65  Temp:  97.5 F (36.4 C)  Resp:  18   GENERAL:64 y.o.male currently in no acute distress.  HEAD: Normocephalic, atraumatic.  EYES: Pupils equal, round, reactive to light. Extraocular muscles intact. No scleral icterus.  MOUTH: Moist mucosal membrane. Dentition  intact. No abscess noted.  EAR, NOSE, THROAT: Clear without exudates. No external lesions.  NECK: Supple. No thyromegaly. No nodules. No JVD.  PULMONARY: Clear to ascultation, without wheeze rails or rhonci. No use of accessory muscles, Good respiratory effort. good air entry bilaterally CHEST: Nontender to palpation.  CARDIOVASCULAR: S1 and S2. Regular rate and rhythm. No murmurs, rubs, or gallops. No edema. Pedal pulses 2+ bilaterally.  GASTROINTESTINAL: Soft, nontender, nondistended. No masses. Positive bowel sounds. No hepatosplenomegaly.  MUSCULOSKELETAL: No swelling, clubbing, or edema. Range of motion full in all extremities.  NEUROLOGIC: Cranial nerves II through XII are intact. No gross focal neurological deficits. Sensation intact. Reflexes intact.  SKIN: No ulceration, lesions, rashes, or cyanosis. Skin warm and dry. Turgor intact.  PSYCHIATRIC: Mood, affect within normal limits. The patient is awake, alert and oriented x 3. Insight, judgment intact.      LABORATORY PANEL:   CBC  Recent Labs Lab 07/21/15 0503  WBC 5.6  HGB 11.8*  HCT 32.9*  PLT 174   ------------------------------------------------------------------------------------------------------------------  Chemistries   Recent Labs Lab 07/21/15 0503  NA 130*  K 2.9*  CL 95*  CO2 25  GLUCOSE 131*  BUN 16  CREATININE 0.95  CALCIUM 8.8*   ------------------------------------------------------------------------------------------------------------------  Cardiac Enzymes No results for input(s): TROPONINI in the last 168 hours. ------------------------------------------------------------------------------------------------------------------  RADIOLOGY:  Dg Elbow Complete Right  07/20/2015  CLINICAL DATA:  Posterior right elbow pain after falling tonight. EXAM: RIGHT ELBOW - COMPLETE 3+ VIEW COMPARISON:  None. FINDINGS: Medial epicondyle all hyperostosis. Moderately large olecranon spur. Small coronoid  process spur. No fracture, dislocation or effusion. IMPRESSION: 1. No fracture, dislocation  or effusion. 2. Degenerative changes. Electronically Signed   By: Claudie Revering M.D.   On: 07/20/2015 22:21   Ct Head Wo Contrast  07/20/2015  CLINICAL DATA:  Post fall on the kitchen tonight with loss of consciousness and headache. History of CVA in 2015. Patient currently on Plavix. EXAM: CT HEAD WITHOUT CONTRAST TECHNIQUE: Contiguous axial images were obtained from the base of the skull through the vertex without intravenous contrast. COMPARISON:  12/24/2014; 02/12/2014; brain MRI - 08/24/2014 FINDINGS: Similar findings of atrophy with sulcal prominence. Old lacunar infarct within the right basal ganglia. Scattered minimal periventricular hypodensities compatible microvascular ischemic disease. The gray-white differentiation is otherwise well maintained without CT evidence of acute large territory infarct. No intraparenchymal or extra-axial mass or hemorrhage. Unchanged size and configuration of the ventricles and basilar cisterns. No midline shift. Limited visualization of the paranasal sinuses and mastoid air cells is normal. No air-fluid levels. Regional soft tissues appear normal. No displaced calvarial fracture. IMPRESSION: Similar findings of atrophy and microvascular ischemic disease without acute intracranial process. Electronically Signed   By: Sandi Mariscal M.D.   On: 07/20/2015 20:55   Dg Knee Complete 4 Views Right  07/20/2015  CLINICAL DATA:  Patient with weakness and dizziness status post fall. Knee pain. Initial encounter. EXAM: RIGHT KNEE - COMPLETE 4+ VIEW COMPARISON:  None. FINDINGS: Normal anatomic alignment. No evidence for acute fracture or dislocation. Tricompartmental osteophytosis. Chondrocalcinosis. Vascular calcifications. No joint effusion. IMPRESSION: No acute osseous abnormality. Chondrocalcinosis. Tricompartmental osteoarthritis. Electronically Signed   By: Lovey Newcomer M.D.   On: 07/20/2015  22:16    EKG:   Orders placed or performed during the hospital encounter of 07/20/15  . ED EKG  . ED EKG  . EKG 12-Lead  . EKG 12-Lead    ASSESSMENT AND PLAN:   Roger Pruitt is a 65 y.o. male presenting with Fall; Dizziness; and Weakness . Admitted 07/20/2015 : Day #: 1 day 1. Urinary tract infection site unspecified: Continue ceftriaxone, follow culture data 2. Weakness/falls: Physical therapy consult 3. Hypokalemia: Replace potassium goal 4-5, replace magnesium greater than 2 4. GERD without esophagitis PPI therapy 5. Type 2 diabetes hold oral agents at insulin sliding scale Venous thromboembolism prophylactic: Lovenox  All the records are reviewed and case discussed with Care Management/Social Workerr. Management plans discussed with the patient, family and they are in agreement.  CODE STATUS: full TOTAL TIME TAKING CARE OF THIS PATIENT: 28 minutes.   POSSIBLE D/C IN 1DAYS, DEPENDING ON CLINICAL CONDITION.   Hower,  Karenann Cai.D on 07/21/2015 at 12:43 PM  Between 7am to 6pm - Pager - [phone]  After 6pm: House Pager: - [phone]  [facility]  Office  [phone]  CC: Primary care physician; Casilda Carls, MD
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
## 2015-07-21 NOTE — Progress Notes (Signed)
Patient ambulated x3 around nurses station with rolling walker. Madlyn Frankel, RN

## 2015-07-22 LAB — BASIC METABOLIC PANEL
ANION GAP: 10 (ref 5–15)
BUN: 16 mg/dL (ref 6–20)
CALCIUM: 8.8 mg/dL — AB (ref 8.9–10.3)
CO2: 25 mmol/L (ref 22–32)
Chloride: 95 mmol/L — ABNORMAL LOW (ref 101–111)
Creatinine, Ser: 0.95 mg/dL (ref 0.61–1.24)
Glucose, Bld: 131 mg/dL — ABNORMAL HIGH (ref 65–99)
POTASSIUM: 2.9 mmol/L — AB (ref 3.5–5.1)
Sodium: 130 mmol/L — ABNORMAL LOW (ref 135–145)

## 2015-07-22 LAB — GLUCOSE, CAPILLARY
GLUCOSE-CAPILLARY: 129 mg/dL — AB (ref 65–99)
GLUCOSE-CAPILLARY: 115 mg/dL — AB (ref 65–99)
GLUCOSE-CAPILLARY: 101 mg/dL — AB (ref 65–99)
GLUCOSE-CAPILLARY: 113 mg/dL — AB (ref 65–99)

## 2015-07-22 LAB — MAGNESIUM: MAGNESIUM: 1.8 mg/dL (ref 1.7–2.4)

## 2015-07-22 MED ORDER — POTASSIUM CHLORIDE CRYS ER 20 MEQ PO TBCR
40.0000 meq | EXTENDED_RELEASE_TABLET | ORAL | Status: AC
  Administered 2015-07-22 (×2): 40 meq via ORAL
  Filled 2015-07-22 (×2): qty 2

## 2015-07-22 MED ORDER — SENNA 8.6 MG PO TABS
2.0000 | ORAL_TABLET | Freq: Every day | ORAL | Status: DC
  Administered 2015-07-22: 17.2 mg via ORAL
  Filled 2015-07-22: qty 2

## 2015-07-22 MED ORDER — DOCUSATE SODIUM 100 MG PO CAPS
100.0000 mg | ORAL_CAPSULE | Freq: Two times a day (BID) | ORAL | Status: DC
  Administered 2015-07-22 – 2015-07-23 (×3): 100 mg via ORAL
  Filled 2015-07-22 (×3): qty 1

## 2015-07-22 NOTE — Progress Notes (Signed)
Physical Therapy Treatment Patient Details Name: Roger Pruitt MRN: [id number] DOB: 1950/06/13 Today's Date: 07/22/2015    History of Present Illness Pt is a 65 y.o. M admitted to hospital for weakness and dizziness causing frequent falls. Pt diagnosed with UTI. Pt has hx of DM, HTN, BPH, and "mini strokes" in 2015.     PT Comments    Pt has shown significant improvement compared to previous session and has met all PT goals. Pt able to perform bed mobility with modified independence. Pt able to transfer from EOB using RW and CGA for safety. Pt able to ambulate approx 650 ft using RW and CGA. Still recommended pt use RW while ambulating d/t hx of dizziness and weakness. Pt performed seated there-ex with no assist. Pt has met all PT goals and will be discharged in house.   Follow Up Recommendations  No PT follow up     Equipment Recommendations       Recommendations for Other Services       Precautions / Restrictions Precautions Precautions: Fall Restrictions Weight Bearing Restrictions: No    Mobility  Bed Mobility Overal bed mobility: Modified Independent             General bed mobility comments: Pt able to perform bed mobility with mod I with use of bed rails and increased time.   Transfers Overall transfer level: Modified independent Equipment used: Rolling walker (2 wheeled) Transfers: Sit to/from Stand Sit to Stand: Min guard         General transfer comment: Pt able to transfer from EOB using RW and CGA for safety. Pt demonstrates improved ability to stand and upright trunk compared to previous visit.   Ambulation/Gait Ambulation/Gait assistance: Min guard Ambulation Distance (Feet): 650 Feet Assistive device: Rolling walker (2 wheeled) Gait Pattern/deviations: Step-through pattern Gait velocity: fast Gait velocity interpretation: at or above normal speed for age/gender General Gait Details: Pt able to ambulate using RW and CGA for safety. Pt demonstrates  step-through gait pattern with increased gait speed. Pt demonstrated flexed trunk w/ ambulation, PT raised RW for help with trunk. Pt reported no dizziness during ambulation.   Stairs            Wheelchair Mobility    Modified Rankin (Stroke Patients Only)       Balance                                    Cognition Arousal/Alertness: Awake/alert Behavior During Therapy: WFL for tasks assessed/performed Overall Cognitive Status: Within Functional Limits for tasks assessed                      Exercises Other Exercises Other Exercises: Pt performed seated ther-ex on B LE including SLR, hip ab/ad and ankle pumps for 2 sets of x12 reps. Pt also performed LAQ and SAQ for 1 set of 12 reps. Pt performed all ther-ex w/o assist.     General Comments        Pertinent Vitals/Pain Pain Assessment: No/denies pain    Home Living                      Prior Function            PT Goals (current goals can now be found in the care plan section) Acute Rehab PT Goals Patient Stated Goal: to return home PT Goal Formulation:  With patient Time For Goal Achievement: 08/04/15 Potential to Achieve Goals: Good Progress towards PT goals: Goals met/education completed, patient discharged from PT    Frequency       PT Plan Discharge plan needs to be updated    Co-evaluation             End of Session Equipment Utilized During Treatment: Gait belt Activity Tolerance: Patient tolerated treatment well Patient left: in chair;with call bell/phone within reach;with chair alarm set     Time: 2330-0762 PT Time Calculation (min) (ACUTE ONLY): 23 min  Charges:                       G Codes:      Sherral Hammers 2015/08/20, 5:44 PM M. Barnett Abu, SPT

## 2015-07-22 NOTE — Care Management Important Message (Signed)
Important Message  Patient Details  Name: Roger Pruitt MRN: [id number] Date of Birth: 1950/10/13   Medicare Important Message Given:  Yes    Juliann Pulse A Athene Schuhmacher 07/22/2015, 10:45 AM

## 2015-07-22 NOTE — Plan of Care (Signed)
Problem: Education: Goal: Knowledge of treatment and prevention of UTI/Pyleonephritis will improve Outcome: Progressing Cont on  Iv abx. Poss d/c home tomorrow  Per md note if pt stable  Problem: Safety: Goal: Ability to remain free from injury will improve Outcome: Progressing amb around unit with walker and 1 assist

## 2015-07-22 NOTE — Progress Notes (Signed)
[facility] [place] NAME: Roger Pruitt    MRN#:  [id number]  DATE OF BIRTH:  1950/09/15  SUBJECTIVE:  Hospital Day: 2 days Roger Pruitt is a 65 y.o. male presenting with Fall; Dizziness; and Weakness  S: Patient feels okay complains of feeling little weak urine culture results are not back he denies any other complaints  Interval Events: Still complains of some weakness however feels somewhat better  REVIEW OF SYSTEMS:  CONSTITUTIONAL: No fever, fatigue positive weakness.  EYES: No blurred or double vision.  EARS, NOSE, AND THROAT: No tinnitus or ear pain.  RESPIRATORY: No cough, shortness of breath, wheezing or hemoptysis.  CARDIOVASCULAR: No chest pain, orthopnea, edema.  GASTROINTESTINAL: No nausea, vomiting, diarrhea or abdominal pain.  GENITOURINARY: No dysuria, hematuria.  ENDOCRINE: No polyuria, nocturia,  HEMATOLOGY: No anemia, easy bruising or bleeding SKIN: No rash or lesion. MUSCULOSKELETAL: No joint pain or arthritis.   NEUROLOGIC: No tingling, numbness, weakness.  PSYCHIATRY: No anxiety or depression.   DRUG ALLERGIES:   Allergies  Allergen Reactions  . Fioricet-Codeine [Butalbital-Apap-Caff-Cod] Nausea And Vomiting  . Morphine And Related Nausea And Vomiting  . Sulfa Antibiotics Nausea And Vomiting  . Synalgos-Dc [Aspirin-Caff-Dihydrocodeine] Itching and Other (See Comments)    Reaction:  Stinging   . Synchlor A [Chlorpheniramine] Rash    VITALS:  Blood pressure 106/65, pulse 58, temperature 98.1 F (36.7 C), temperature source Oral, resp. rate 20, height 6\' 3"  (1.905 m), weight 93.396 kg (205 lb 14.4 oz), SpO2 100 %.  PHYSICAL EXAMINATION:  VITAL SIGNS: Filed Vitals:   07/22/15 0729 07/22/15 0802  BP: 98/54 106/65  Pulse: 55 58  Temp: 97.5 F (36.4 C) 98.1 F (36.7 C)  Resp: 20 20   GENERAL:64 y.o.male currently in no acute distress.  HEAD: Normocephalic, atraumatic.  EYES: Pupils equal, round,  reactive to light. Extraocular muscles intact. No scleral icterus.  MOUTH: Moist mucosal membrane. Dentition intact. No abscess noted.  EAR, NOSE, THROAT: Clear without exudates. No external lesions.  NECK: Supple. No thyromegaly. No nodules. No JVD.  PULMONARY: Clear to ascultation, without wheeze rails or rhonci. No use of accessory muscles, Good respiratory effort. good air entry bilaterally CHEST: Nontender to palpation.  CARDIOVASCULAR: S1 and S2. Regular rate and rhythm. No murmurs, rubs, or gallops. No edema. Pedal pulses 2+ bilaterally.  GASTROINTESTINAL: Soft, nontender, nondistended. No masses. Positive bowel sounds. No hepatosplenomegaly.  MUSCULOSKELETAL: No swelling, clubbing, or edema. Range of motion full in all extremities.  NEUROLOGIC: Cranial nerves II through XII are intact. No gross focal neurological deficits. Sensation intact. Reflexes intact.  SKIN: No ulceration, lesions, rashes, or cyanosis. Skin warm and dry. Turgor intact.  PSYCHIATRIC: Mood, affect within normal limits. The patient is awake, alert and oriented x 3. Insight, judgment intact.      LABORATORY PANEL:   CBC  Recent Labs Lab 07/21/15 0503  WBC 5.6  HGB 11.8*  HCT 32.9*  PLT 174   ------------------------------------------------------------------------------------------------------------------  Chemistries   Recent Labs Lab 07/21/15 0503  NA 130*  K 2.9*  CL 95*  CO2 25  GLUCOSE 131*  BUN 16  CREATININE 0.95  CALCIUM 8.8*   ------------------------------------------------------------------------------------------------------------------  Cardiac Enzymes No results for input(s): TROPONINI in the last 168 hours. ------------------------------------------------------------------------------------------------------------------  RADIOLOGY:  Dg Elbow Complete Right  07/20/2015  CLINICAL DATA:  Posterior right elbow pain after falling tonight. EXAM: RIGHT ELBOW - COMPLETE 3+ VIEW  COMPARISON:  None. FINDINGS: Medial epicondyle all hyperostosis. Moderately  large olecranon spur. Small coronoid process spur. No fracture, dislocation or effusion. IMPRESSION: 1. No fracture, dislocation or effusion. 2. Degenerative changes. Electronically Signed   By: Claudie Revering M.D.   On: 07/20/2015 22:21   Ct Head Wo Contrast  07/20/2015  CLINICAL DATA:  Post fall on the kitchen tonight with loss of consciousness and headache. History of CVA in 2015. Patient currently on Plavix. EXAM: CT HEAD WITHOUT CONTRAST TECHNIQUE: Contiguous axial images were obtained from the base of the skull through the vertex without intravenous contrast. COMPARISON:  12/24/2014; 02/12/2014; brain MRI - 08/24/2014 FINDINGS: Similar findings of atrophy with sulcal prominence. Old lacunar infarct within the right basal ganglia. Scattered minimal periventricular hypodensities compatible microvascular ischemic disease. The gray-white differentiation is otherwise well maintained without CT evidence of acute large territory infarct. No intraparenchymal or extra-axial mass or hemorrhage. Unchanged size and configuration of the ventricles and basilar cisterns. No midline shift. Limited visualization of the paranasal sinuses and mastoid air cells is normal. No air-fluid levels. Regional soft tissues appear normal. No displaced calvarial fracture. IMPRESSION: Similar findings of atrophy and microvascular ischemic disease without acute intracranial process. Electronically Signed   By: Sandi Mariscal M.D.   On: 07/20/2015 20:55   Dg Knee Complete 4 Views Right  07/20/2015  CLINICAL DATA:  Patient with weakness and dizziness status post fall. Knee pain. Initial encounter. EXAM: RIGHT KNEE - COMPLETE 4+ VIEW COMPARISON:  None. FINDINGS: Normal anatomic alignment. No evidence for acute fracture or dislocation. Tricompartmental osteophytosis. Chondrocalcinosis. Vascular calcifications. No joint effusion. IMPRESSION: No acute osseous abnormality.  Chondrocalcinosis. Tricompartmental osteoarthritis. Electronically Signed   By: Lovey Newcomer M.D.   On: 07/20/2015 22:16    EKG:   Orders placed or performed during the hospital encounter of 07/20/15  . ED EKG  . ED EKG  . EKG 12-Lead  . EKG 12-Lead    ASSESSMENT AND PLAN:   Roger Pruitt is a 65 y.o. male presenting with Fall; Dizziness; and Weakness  1. Urinary tract infection site unspecified: Culture results pending. Continue ceftriaxone will need these prior to discharge 2. Weakness/falls: Physical therapy consult is pending 3. Hypokalemia: Potassium still very low I will give him more potassium  4. GERD without esophagitis PPI therapy 5. Type 2 diabetes blood sugar currently stable continue sliding scale Venous thromboembolism prophylactic: Lovenox  All the records are reviewed and case discussed with Care Management/Social Workerr. Management plans discussed with the patient, family and they are in agreement.  CODE STATUS: full TOTAL TIME TAKING CARE OF THIS PATIENT: 28 minutes.   POSSIBLE D/C tomorrow, DEPENDING ON CLINICAL CONDITION.   Dustin Flock M.D on 07/22/2015 at 11:28 AM  Between 7am to 6pm - Pager - [phone]  After 6pm: House Pager: - [phone]  [facility]  Office  [phone]  CC: Primary care physician; Casilda Carls, MD

## 2015-07-23 LAB — BASIC METABOLIC PANEL
Anion gap: 7 (ref 5–15)
BUN: 18 mg/dL (ref 6–20)
CHLORIDE: 100 mmol/L — AB (ref 101–111)
CO2: 25 mmol/L (ref 22–32)
Calcium: 8.9 mg/dL (ref 8.9–10.3)
Creatinine, Ser: 0.85 mg/dL (ref 0.61–1.24)
GFR calc Af Amer: >60 mL/min (ref >60)
GFR calc non Af Amer: >60 mL/min (ref >60)
GLUCOSE: 120 mg/dL — AB (ref 65–99)
POTASSIUM: 3.8 mmol/L (ref 3.5–5.1)
Sodium: 132 mmol/L — ABNORMAL LOW (ref 135–145)

## 2015-07-23 LAB — URINE CULTURE: Culture: >=100000 — AB

## 2015-07-23 LAB — GLUCOSE, CAPILLARY
GLUCOSE-CAPILLARY: 94 mg/dL (ref 65–99)
GLUCOSE-CAPILLARY: 88 mg/dL (ref 65–99)

## 2015-07-23 MED ORDER — CEFUROXIME AXETIL 500 MG PO TABS: 500.0000 mg | ORAL_TABLET | Freq: Two times a day (BID) | ORAL | Status: AC

## 2015-07-23 MED ORDER — CEFUROXIME AXETIL 500 MG PO TABS
500.0000 mg | ORAL_TABLET | Freq: Two times a day (BID) | ORAL | Status: DC
  Filled 2015-07-23 (×2): qty 1

## 2015-07-23 MED ORDER — POTASSIUM CHLORIDE ER 10 MEQ PO TBCR: 10.0000 meq | EXTENDED_RELEASE_TABLET | Freq: Every day | ORAL | Status: DC

## 2015-07-23 NOTE — Progress Notes (Signed)
Pt being discharged home, discharge instructions reviewed with pt, states understanding, pt with no noted complaints at discharge

## 2015-07-23 NOTE — Discharge Summary (Signed)
Roger Pruitt, 65 y.o., DOB [birth year], MRN [id number]. Admission date: 07/20/2015 Discharge Date 07/23/2015 Primary MD Casilda Carls, MD Admitting Physician Lance Coon, MD  Admission Diagnosis  Hyponatremia [E87.1] Contusion, multiple sites [T14.8] UTI (lower urinary tract infection) [N39.0] Fall, initial encounter [W19.XXXA]  Discharge Diagnosis   Principal Problem:   UTI (lower urinary tract infection)   Fall dizziness and weakness   CAD (coronary artery disease)   Hyperlipidemia   Diabetes mellitus ([place])   Hypertension   GERD (gastroesophageal reflux disease)         Hospital Course  Patient is a 65 year old male who presented with multiple forms at home. He was feeling very weak and lightheaded. Patient came to the ED and was noted to have a urinary tract infection. He has a history of having recurrent urinary tract infections. Patient was admitted to the hospital for treatment of his UTI and further evaluation. After treatment of this urinary tract infection patient is doing much better he was seen by physical therapy. They did not recommend any further therapy. Currently is doing much better and is stable for discharge.          Consults  None  Significant Tests:  See full reports for all details       Dg Elbow Complete Right  07/20/2015  CLINICAL DATA:  Posterior right elbow pain after falling tonight. EXAM: RIGHT ELBOW - COMPLETE 3+ VIEW COMPARISON:  None. FINDINGS: Medial epicondyle all hyperostosis. Moderately large olecranon spur. Small coronoid process spur. No fracture, dislocation or effusion. IMPRESSION: 1. No fracture, dislocation or effusion. 2. Degenerative changes. Electronically Signed   By: Claudie Revering M.D.   On: 07/20/2015 22:21   Ct Head Wo Contrast  07/20/2015  CLINICAL DATA:  Post fall on the kitchen tonight with loss of consciousness and headache. History of CVA in 2015. Patient currently on Plavix. EXAM: CT HEAD WITHOUT CONTRAST TECHNIQUE:  Contiguous axial images were obtained from the base of the skull through the vertex without intravenous contrast. COMPARISON:  12/24/2014; 02/12/2014; brain MRI - 08/24/2014 FINDINGS: Similar findings of atrophy with sulcal prominence. Old lacunar infarct within the right basal ganglia. Scattered minimal periventricular hypodensities compatible microvascular ischemic disease. The gray-white differentiation is otherwise well maintained without CT evidence of acute large territory infarct. No intraparenchymal or extra-axial mass or hemorrhage. Unchanged size and configuration of the ventricles and basilar cisterns. No midline shift. Limited visualization of the paranasal sinuses and mastoid air cells is normal. No air-fluid levels. Regional soft tissues appear normal. No displaced calvarial fracture. IMPRESSION: Similar findings of atrophy and microvascular ischemic disease without acute intracranial process. Electronically Signed   By: Sandi Mariscal M.D.   On: 07/20/2015 20:55   Dg Knee Complete 4 Views Right  07/20/2015  CLINICAL DATA:  Patient with weakness and dizziness status post fall. Knee pain. Initial encounter. EXAM: RIGHT KNEE - COMPLETE 4+ VIEW COMPARISON:  None. FINDINGS: Normal anatomic alignment. No evidence for acute fracture or dislocation. Tricompartmental osteophytosis. Chondrocalcinosis. Vascular calcifications. No joint effusion. IMPRESSION: No acute osseous abnormality. Chondrocalcinosis. Tricompartmental osteoarthritis. Electronically Signed   By: Lovey Newcomer M.D.   On: 07/20/2015 22:16       Today   Subjective:   Radene Knee  patient feels better he's not complaining of any nausea vomiting or diarrhea  Objective:   Blood pressure 120/67, pulse 61, temperature 97.6 F (36.4 C), temperature source Oral, resp. rate 18, height 6\' 3"  (1.905 m), weight 93.396 kg (205 lb 14.4  oz), SpO2 97 %.  .  Intake/Output Summary (Last 24 hours) at 07/23/15 1330 Last data filed at 07/23/15 1127   Gross per 24 hour  Intake    480 ml  Output   2025 ml  Net  -1545 ml    Exam VITAL SIGNS: Blood pressure 120/67, pulse 61, temperature 97.6 F (36.4 C), temperature source Oral, resp. rate 18, height 6\' 3"  (1.905 m), weight 93.396 kg (205 lb 14.4 oz), SpO2 97 %.  GENERAL:  65 y.o.-year-old patient lying in the bed with no acute distress.  EYES: Pupils equal, round, reactive to light and accommodation. No scleral icterus. Extraocular muscles intact.  HEENT: Head atraumatic, normocephalic. Oropharynx and nasopharynx clear.  NECK:  Supple, no jugular venous distention. No thyroid enlargement, no tenderness.  LUNGS: Normal breath sounds bilaterally, no wheezing, rales,rhonchi or crepitation. No use of accessory muscles of respiration.  CARDIOVASCULAR: S1, S2 normal. No murmurs, rubs, or gallops.  ABDOMEN: Soft, nontender, nondistended. Bowel sounds present. No organomegaly or mass.  EXTREMITIES: No pedal edema, cyanosis, or clubbing.  NEUROLOGIC: Cranial nerves II through XII are intact. Muscle strength 5/5 in all extremities. Sensation intact. Gait not checked.  PSYCHIATRIC: The patient is alert and oriented x 3.  SKIN: No obvious rash, lesion, or ulcer.   Data Review     CBC w Diff: Lab Results  Component Value Date   WBC 5.6 07/21/2015   WBC 6.1 03/25/2014   WBC 6.0 06/08/2012   HGB 11.8* 07/21/2015   HGB 12.8* 03/25/2014   HCT 32.9* 07/21/2015   HCT 38.4* 03/25/2014   PLT 174 07/21/2015   PLT 206 03/25/2014   LYMPHOPCT 8 08/23/2014   LYMPHOPCT 12.8 02/03/2014   MONOPCT 7 08/23/2014   MONOPCT 10.2 02/03/2014   EOSPCT 0 08/23/2014   EOSPCT 1.6 02/03/2014   BASOPCT 0 08/23/2014   BASOPCT 0.5 02/03/2014   CMP: Lab Results  Component Value Date   NA 132* 07/23/2015   NA 137 05/27/2015   NA 142 03/25/2014   K 3.8 07/23/2015   K 3.4* 03/25/2014   CL 100* 07/23/2015   CL 108* 03/25/2014   CO2 25 07/23/2015   CO2 27 03/25/2014   BUN 18 07/23/2015   BUN 15 05/27/2015    BUN 14 03/25/2014   CREATININE 0.85 07/23/2015   CREATININE 0.82 03/25/2014   PROT 6.2* 08/23/2014   PROT 6.2* 02/11/2014   ALBUMIN 3.7 08/23/2014   ALBUMIN 3.2* 02/11/2014   BILITOT 0.8 08/23/2014   BILITOT 0.2 02/11/2014   ALKPHOS 36* 08/23/2014   ALKPHOS 58 02/11/2014   AST 18 08/23/2014   AST 9* 02/11/2014   ALT 12* 08/23/2014   ALT 16 02/11/2014  .  Micro Results Recent Results (from the past 240 hour(s))  Urine culture     Status: Abnormal   Collection Time: 07/20/15  6:46 PM  Result Value Ref Range Status   Specimen Description URINE, RANDOM  Final   Special Requests NONE  Final   Culture >=100,000 COLONIES/mL PROTEUS MIRABILIS (A)  Final   Report Status 07/23/2015 FINAL  Final   Organism ID, Bacteria PROTEUS MIRABILIS (A)  Final      Susceptibility   Proteus mirabilis - MIC*    AMPICILLIN <=2 SENSITIVE Sensitive     CEFAZOLIN <=4 SENSITIVE Sensitive     CEFTRIAXONE <=1 SENSITIVE Sensitive     CIPROFLOXACIN <=0.25 SENSITIVE Sensitive     GENTAMICIN <=1 SENSITIVE Sensitive     IMIPENEM 1 SENSITIVE Sensitive  NITROFURANTOIN 256 RESISTANT Resistant     TRIMETH/SULFA <=20 SENSITIVE Sensitive     AMPICILLIN/SULBACTAM <=2 SENSITIVE Sensitive     PIP/TAZO <=4 SENSITIVE Sensitive     * >=100,000 COLONIES/mL PROTEUS MIRABILIS        Code Status Orders        Start     Ordered   07/21/15 0030  Full code   Continuous     07/21/15 0029    Code Status History    Date Active Date Inactive Code Status Order ID Comments User Context   08/23/2014  4:52 PM 08/24/2014  4:09 PM Full Code [id number]  Demetrios Loll, MD Inpatient   01/01/2013  4:19 AM 01/01/2013  8:29 PM Full Code [id number]  Toy Baker, MD Inpatient          Follow-up Information    Follow up with Casilda Carls, MD On 07/30/2015.   Specialty:  Internal Medicine   Why:  at 11:30   Contact information:   [address] [place] [address] [phone]       Discharge Medications      Medication List    TAKE these medications        buPROPion 300 MG 24 hr tablet  Commonly known as:  WELLBUTRIN XL  Take 300 mg by mouth daily.     Carbamazepine 300 MG Cp12  Commonly known as:  Equetro  Take 300 mg by mouth 2 (two) times daily.     carvedilol 25 MG tablet  Commonly known as:  COREG  Take 25 mg by mouth 2 (two) times daily with a meal.     cefUROXime 500 MG tablet  Commonly known as:  CEFTIN  Take 1 tablet (500 mg total) by mouth 2 (two) times daily with a meal.     chlorthalidone 25 MG tablet  Commonly known as:  HYGROTON  Take 25 mg by mouth daily.     clopidogrel 75 MG tablet  Commonly known as:  PLAVIX  Take 75 mg by mouth daily.     doxepin 25 MG capsule  Commonly known as:  SINEQUAN  Take 50 mg by mouth at bedtime.     finasteride 5 MG tablet  Commonly known as:  PROSCAR  Take 5 mg by mouth daily.     gabapentin 600 MG tablet  Commonly known as:  NEURONTIN  Take 600 mg by mouth 3 (three) times daily.     lisinopril 40 MG tablet  Commonly known as:  PRINIVIL,ZESTRIL  Take 1 tablet (40 mg total) by mouth daily.     LORazepam 1 MG tablet  Commonly known as:  ATIVAN  Take 1-2 mg by mouth 2 (two) times daily. Pt takes one tablet in the morning and two at bedtime.     magnesium oxide 400 (241.3 Mg) MG tablet  Commonly known as:  MAG-OX  Take 400 mg by mouth daily.     metFORMIN 1000 MG tablet  Commonly known as:  GLUCOPHAGE  Take 1,000 mg by mouth 2 (two) times daily with a meal.     multivitamin with minerals Tabs tablet  Take 1 tablet by mouth daily.     nitroGLYCERIN 0.4 MG SL tablet  Commonly known as:  NITROSTAT  Place 0.4 mg under the tongue every 5 (five) minutes as needed for chest pain.     paliperidone 6 MG 24 hr tablet  Commonly known as:  INVEGA  Take 6 mg by mouth at bedtime.  pantoprazole 40 MG tablet  Commonly known as:  PROTONIX  Take 40 mg by mouth daily.     potassium chloride 10 MEQ tablet  Commonly known  as:  K-DUR  Take 1 tablet (10 mEq total) by mouth daily.     potassium chloride SA 20 MEQ tablet  Commonly known as:  K-DUR,KLOR-CON  Take 20 mEq by mouth 2 (two) times daily.     RANEXA 1000 MG SR tablet  Generic drug:  ranolazine  Take 1,000 mg by mouth 2 (two) times daily.     sitaGLIPtin 100 MG tablet  Commonly known as:  JANUVIA  Take 100 mg by mouth daily.     tamsulosin 0.4 MG Caps capsule  Commonly known as:  FLOMAX  Take 0.4 mg by mouth daily after breakfast.     topiramate 100 MG tablet  Commonly known as:  TOPAMAX  Take 100 mg by mouth daily.     traZODone 50 MG tablet  Commonly known as:  DESYREL  Take 50 mg by mouth at bedtime as needed for sleep.           Total Time in preparing paper work, data evaluation and todays exam - 35 minutes  Dustin Flock M.D on 07/23/2015 at 1:30 PM  [facility]   Office  [phone]

## 2015-07-23 NOTE — Discharge Instructions (Signed)
°  DIET:  °Regular diet ° °DISCHARGE CONDITION:  °Good ° °ACTIVITY:  °Activity as tolerated ° °OXYGEN:  °Home Oxygen: No. °  °Oxygen Delivery: room air ° °DISCHARGE LOCATION:  °home  ° ° °ADDITIONAL DISCHARGE INSTRUCTION: ° ° °If you experience worsening of your admission symptoms, develop shortness of breath, life threatening emergency, suicidal or homicidal thoughts you must seek medical attention immediately by calling 911 or calling your MD immediately  if symptoms less severe. ° °You Must read complete instructions/literature along with all the possible adverse reactions/side effects for all the Medicines you take and that have been prescribed to you. Take any new Medicines after you have completely understood and accpet all the possible adverse reactions/side effects.  ° °Please note ° °You were cared for by a hospitalist during your hospital stay. If you have any questions about your discharge medications or the care you received while you were in the hospital after you are discharged, you can call the unit and asked to speak with the hospitalist on call if the hospitalist that took care of you is not available. Once you are discharged, your primary care physician will handle any further medical issues. Please note that NO REFILLS for any discharge medications will be authorized once you are discharged, as it is imperative that you return to your primary care physician (or establish a relationship with a primary care physician if you do not have one) for your aftercare needs so that they can reassess your need for medications and monitor your lab values. ° ° °

## 2015-07-23 NOTE — Progress Notes (Signed)
Pharmacy Antibiotic Note  Roger Pruitt is a 65 y.o. male admitted on 07/20/2015 with UTI.  Pharmacy has been consulted for ceftriaxone dosing.  Plan: Will continue ceftriaxone 1gm IV Q24H. Culture ID and sensitivities still pending.   Height: 6\' 3"  (190.5 cm) Weight: 205 lb 14.4 oz (93.396 kg) IBW/kg (Calculated) : 84.5  Temp (24hrs), Avg:97.7 F (36.5 C), Min:97.6 F (36.4 C), Max:97.8 F (36.6 C)   Recent Labs Lab 07/20/15 1846 07/21/15 0503  WBC 6.7 5.6  CREATININE 0.92 0.95    Estimated Creatinine Clearance: 93.9 mL/min (by C-G formula based on Cr of 0.95).    Allergies  Allergen Reactions  . Fioricet-Codeine [Butalbital-Apap-Caff-Cod] Nausea And Vomiting  . Morphine And Related Nausea And Vomiting  . Sulfa Antibiotics Nausea And Vomiting  . Synalgos-Dc [Aspirin-Caff-Dihydrocodeine] Itching and Other (See Comments)    Reaction:  Stinging   . Synchlor A [Chlorpheniramine] Rash   Microbiology: 4/24 Urine: > 100k GNR  Thank you for allowing pharmacy to be a part of this patient's care.  Rexene Edison, PharmD Clinical Pharmacist   07/23/2015 8:09 AM

## 2015-07-23 NOTE — Progress Notes (Signed)
Dr Posey Pronto aware that pts K+ 3.8, per MD ok to discharge pt

## 2015-07-28 ENCOUNTER — Emergency Department: Payer: Medicare Other

## 2015-07-28 ENCOUNTER — Encounter: Payer: Self-pay | Admitting: *Deleted

## 2015-07-28 ENCOUNTER — Encounter: Payer: Self-pay | Admitting: Podiatry

## 2015-07-28 ENCOUNTER — Inpatient Hospital Stay
Admission: EM | Admit: 2015-07-28 | Discharge: 2015-07-30 | DRG: 641 | Disposition: A | Discharge status: Home / Self Care | Payer: Medicare Other | Attending: Internal Medicine | Admitting: Internal Medicine

## 2015-07-28 ENCOUNTER — Ambulatory Visit (INDEPENDENT_AMBULATORY_CARE_PROVIDER_SITE_OTHER): Payer: Medicare Other | Admitting: Podiatry

## 2015-07-28 ENCOUNTER — Encounter: Payer: Self-pay | Admitting: Emergency Medicine

## 2015-07-28 ENCOUNTER — Ambulatory Visit: Payer: Self-pay | Admitting: Cardiovascular Disease

## 2015-07-28 VITALS — BP 146/78 | HR 59 | Resp 18

## 2015-07-28 DIAGNOSIS — R29810 Facial weakness: Secondary | ICD-10-CM | POA: Diagnosis present

## 2015-07-28 DIAGNOSIS — M216X1 Other acquired deformities of right foot: Secondary | ICD-10-CM

## 2015-07-28 DIAGNOSIS — Z8042 Family history of malignant neoplasm of prostate: Secondary | ICD-10-CM | POA: Diagnosis not present

## 2015-07-28 DIAGNOSIS — Z8249 Family history of ischemic heart disease and other diseases of the circulatory system: Secondary | ICD-10-CM

## 2015-07-28 DIAGNOSIS — E871 Hypo-osmolality and hyponatremia: Secondary | ICD-10-CM | POA: Diagnosis present

## 2015-07-28 DIAGNOSIS — K219 Gastro-esophageal reflux disease without esophagitis: Secondary | ICD-10-CM | POA: Diagnosis present

## 2015-07-28 DIAGNOSIS — M216X2 Other acquired deformities of left foot: Secondary | ICD-10-CM

## 2015-07-28 DIAGNOSIS — R42 Dizziness and giddiness: Secondary | ICD-10-CM | POA: Diagnosis not present

## 2015-07-28 DIAGNOSIS — R29898 Other symptoms and signs involving the musculoskeletal system: Secondary | ICD-10-CM

## 2015-07-28 DIAGNOSIS — R531 Weakness: Secondary | ICD-10-CM

## 2015-07-28 DIAGNOSIS — Z882 Allergy status to sulfonamides status: Secondary | ICD-10-CM | POA: Diagnosis not present

## 2015-07-28 DIAGNOSIS — R410 Disorientation, unspecified: Secondary | ICD-10-CM | POA: Diagnosis not present

## 2015-07-28 DIAGNOSIS — N400 Enlarged prostate without lower urinary tract symptoms: Secondary | ICD-10-CM | POA: Diagnosis present

## 2015-07-28 DIAGNOSIS — Z885 Allergy status to narcotic agent status: Secondary | ICD-10-CM | POA: Diagnosis not present

## 2015-07-28 DIAGNOSIS — M2040 Other hammer toe(s) (acquired), unspecified foot: Secondary | ICD-10-CM

## 2015-07-28 DIAGNOSIS — R269 Unspecified abnormalities of gait and mobility: Secondary | ICD-10-CM | POA: Diagnosis not present

## 2015-07-28 DIAGNOSIS — B351 Tinea unguium: Secondary | ICD-10-CM | POA: Diagnosis not present

## 2015-07-28 DIAGNOSIS — R4781 Slurred speech: Secondary | ICD-10-CM

## 2015-07-28 DIAGNOSIS — Z87440 Personal history of urinary (tract) infections: Secondary | ICD-10-CM

## 2015-07-28 DIAGNOSIS — Z888 Allergy status to other drugs, medicaments and biological substances status: Secondary | ICD-10-CM | POA: Diagnosis not present

## 2015-07-28 DIAGNOSIS — M79676 Pain in unspecified toe(s): Secondary | ICD-10-CM | POA: Diagnosis not present

## 2015-07-28 DIAGNOSIS — G8929 Other chronic pain: Secondary | ICD-10-CM | POA: Diagnosis present

## 2015-07-28 DIAGNOSIS — E861 Hypovolemia: Secondary | ICD-10-CM | POA: Diagnosis present

## 2015-07-28 DIAGNOSIS — E1149 Type 2 diabetes mellitus with other diabetic neurological complication: Secondary | ICD-10-CM | POA: Diagnosis not present

## 2015-07-28 DIAGNOSIS — E1140 Type 2 diabetes mellitus with diabetic neuropathy, unspecified: Secondary | ICD-10-CM | POA: Diagnosis present

## 2015-07-28 DIAGNOSIS — Z79899 Other long term (current) drug therapy: Secondary | ICD-10-CM

## 2015-07-28 DIAGNOSIS — M5136 Other intervertebral disc degeneration, lumbar region: Secondary | ICD-10-CM | POA: Diagnosis present

## 2015-07-28 DIAGNOSIS — Z951 Presence of aortocoronary bypass graft: Secondary | ICD-10-CM

## 2015-07-28 DIAGNOSIS — I2510 Atherosclerotic heart disease of native coronary artery without angina pectoris: Secondary | ICD-10-CM | POA: Diagnosis present

## 2015-07-28 DIAGNOSIS — R110 Nausea: Secondary | ICD-10-CM | POA: Diagnosis not present

## 2015-07-28 DIAGNOSIS — I252 Old myocardial infarction: Secondary | ICD-10-CM

## 2015-07-28 DIAGNOSIS — M5126 Other intervertebral disc displacement, lumbar region: Secondary | ICD-10-CM | POA: Diagnosis not present

## 2015-07-28 DIAGNOSIS — I10 Essential (primary) hypertension: Secondary | ICD-10-CM | POA: Diagnosis present

## 2015-07-28 DIAGNOSIS — Z955 Presence of coronary angioplasty implant and graft: Secondary | ICD-10-CM | POA: Diagnosis not present

## 2015-07-28 DIAGNOSIS — E119 Type 2 diabetes mellitus without complications: Secondary | ICD-10-CM | POA: Diagnosis not present

## 2015-07-28 DIAGNOSIS — G4730 Sleep apnea, unspecified: Secondary | ICD-10-CM | POA: Diagnosis present

## 2015-07-28 DIAGNOSIS — M4800 Spinal stenosis, site unspecified: Secondary | ICD-10-CM | POA: Diagnosis present

## 2015-07-28 DIAGNOSIS — Z87891 Personal history of nicotine dependence: Secondary | ICD-10-CM

## 2015-07-28 DIAGNOSIS — Z9889 Other specified postprocedural states: Secondary | ICD-10-CM | POA: Diagnosis not present

## 2015-07-28 HISTORY — DX: Atherosclerotic heart disease of native coronary artery without angina pectoris: I25.10

## 2015-07-28 LAB — COMPREHENSIVE METABOLIC PANEL
ALK PHOS: 38 U/L (ref 38–126)
ALT: 15 U/L — AB (ref 17–63)
AST: 20 U/L (ref 15–41)
Albumin: 4.3 g/dL (ref 3.5–5.0)
Anion gap: 8 (ref 5–15)
BILIRUBIN TOTAL: 0.6 mg/dL (ref 0.3–1.2)
BUN: 17 mg/dL (ref 6–20)
CALCIUM: 9.3 mg/dL (ref 8.9–10.3)
CO2: 25 mmol/L (ref 22–32)
CREATININE: 0.94 mg/dL (ref 0.61–1.24)
Chloride: 93 mmol/L — ABNORMAL LOW (ref 101–111)
GFR calc Af Amer: >60 mL/min (ref >60)
Glucose, Bld: 95 mg/dL (ref 65–99)
Potassium: 3.9 mmol/L (ref 3.5–5.1)
Sodium: 126 mmol/L — ABNORMAL LOW (ref 135–145)
TOTAL PROTEIN: 7 g/dL (ref 6.5–8.1)

## 2015-07-28 LAB — CBC WITH DIFFERENTIAL/PLATELET
BASOS ABS: 0 10*3/uL (ref 0–0.1)
BASOS PCT: 0 %
EOS ABS: 0.1 10*3/uL (ref 0–0.7)
EOS PCT: 2 %
HCT: 36.9 % — ABNORMAL LOW (ref 40.0–52.0)
Hemoglobin: 12.9 g/dL — ABNORMAL LOW (ref 13.0–18.0)
LYMPHS PCT: 19 %
Lymphs Abs: 1.3 10*3/uL (ref 1.0–3.6)
MCH: 31.6 pg (ref 26.0–34.0)
MCHC: 34.8 g/dL (ref 32.0–36.0)
MCV: 90.6 fL (ref 80.0–100.0)
MONO ABS: 0.4 10*3/uL (ref 0.2–1.0)
Monocytes Relative: 6 %
Neutro Abs: 5 10*3/uL (ref 1.4–6.5)
Neutrophils Relative %: 73 %
PLATELETS: 209 10*3/uL (ref 150–440)
RBC: 4.07 MIL/uL — ABNORMAL LOW (ref 4.40–5.90)
RDW: 12.8 % (ref 11.5–14.5)
WBC: 6.9 10*3/uL (ref 3.8–10.6)

## 2015-07-28 LAB — URINALYSIS COMPLETE WITH MICROSCOPIC (ARMC ONLY)
BILIRUBIN URINE: NEGATIVE
Bacteria, UA: NONE SEEN
Glucose, UA: NEGATIVE mg/dL
Hgb urine dipstick: NEGATIVE
KETONES UR: NEGATIVE mg/dL
LEUKOCYTES UA: NEGATIVE
Nitrite: NEGATIVE
PH: 7 (ref 5.0–8.0)
PROTEIN: NEGATIVE mg/dL
Specific Gravity, Urine: 1.015 (ref 1.005–1.030)

## 2015-07-28 LAB — GLUCOSE, CAPILLARY: Glucose-Capillary: 93 mg/dL (ref 65–99)

## 2015-07-28 LAB — LIPASE, BLOOD: LIPASE: 20 U/L (ref 11–51)

## 2015-07-28 MED ORDER — ACETAMINOPHEN 325 MG PO TABS: 650.0000 mg | ORAL_TABLET | Freq: Four times a day (QID) | ORAL | Status: DC | PRN

## 2015-07-28 MED ORDER — PANTOPRAZOLE SODIUM 40 MG PO TBEC
40.0000 mg | DELAYED_RELEASE_TABLET | Freq: Every day | ORAL | Status: DC
  Administered 2015-07-29 – 2015-07-30 (×2): 40 mg via ORAL
  Filled 2015-07-28 (×2): qty 1

## 2015-07-28 MED ORDER — DOCUSATE SODIUM 100 MG PO CAPS
100.0000 mg | ORAL_CAPSULE | Freq: Two times a day (BID) | ORAL | Status: DC
  Administered 2015-07-29 – 2015-07-30 (×4): 100 mg via ORAL
  Filled 2015-07-28 (×4): qty 1

## 2015-07-28 MED ORDER — ASPIRIN EC 81 MG PO TBEC
81.0000 mg | DELAYED_RELEASE_TABLET | Freq: Every day | ORAL | Status: DC
  Administered 2015-07-29 – 2015-07-30 (×2): 81 mg via ORAL
  Filled 2015-07-28 (×2): qty 1

## 2015-07-28 MED ORDER — DOXEPIN HCL 25 MG PO CAPS
50.0000 mg | ORAL_CAPSULE | Freq: Every day | ORAL | Status: DC
  Administered 2015-07-29 (×2): 50 mg via ORAL
  Filled 2015-07-28 (×2): qty 2

## 2015-07-28 MED ORDER — RANOLAZINE ER 500 MG PO TB12
1000.0000 mg | ORAL_TABLET | Freq: Two times a day (BID) | ORAL | Status: DC
  Administered 2015-07-29 – 2015-07-30 (×4): 1000 mg via ORAL
  Filled 2015-07-28 (×4): qty 2

## 2015-07-28 MED ORDER — TRAZODONE HCL 50 MG PO TABS: 50.0000 mg | ORAL_TABLET | Freq: Every evening | ORAL | Status: DC | PRN

## 2015-07-28 MED ORDER — NITROGLYCERIN 0.4 MG SL SUBL: 0.4000 mg | SUBLINGUAL_TABLET | SUBLINGUAL | Status: DC | PRN

## 2015-07-28 MED ORDER — SODIUM CHLORIDE 0.9% FLUSH
3.0000 mL | Freq: Two times a day (BID) | INTRAVENOUS | Status: DC
  Administered 2015-07-29 – 2015-07-30 (×2): 3 mL via INTRAVENOUS

## 2015-07-28 MED ORDER — SODIUM CHLORIDE 0.9 % IV BOLUS (SEPSIS)
1000.0000 mL | Freq: Once | INTRAVENOUS | Status: AC
  Administered 2015-07-28: 1000 mL via INTRAVENOUS

## 2015-07-28 MED ORDER — INSULIN ASPART 100 UNIT/ML ~~LOC~~ SOLN: 0.0000 [IU] | Freq: Every day | SUBCUTANEOUS | Status: DC

## 2015-07-28 MED ORDER — LISINOPRIL 20 MG PO TABS
40.0000 mg | ORAL_TABLET | Freq: Every day | ORAL | Status: DC
  Administered 2015-07-29: 40 mg via ORAL
  Filled 2015-07-28 (×2): qty 2

## 2015-07-28 MED ORDER — POTASSIUM CHLORIDE CRYS ER 20 MEQ PO TBCR
20.0000 meq | EXTENDED_RELEASE_TABLET | Freq: Two times a day (BID) | ORAL | Status: DC
  Administered 2015-07-29 – 2015-07-30 (×4): 20 meq via ORAL
  Filled 2015-07-28 (×4): qty 1

## 2015-07-28 MED ORDER — SODIUM CHLORIDE 0.9 % IV SOLN
INTRAVENOUS | Status: DC
  Administered 2015-07-29: 09:00:00 via INTRAVENOUS
  Administered 2015-07-29: 125 mL/h via INTRAVENOUS
  Administered 2015-07-29: via INTRAVENOUS
  Administered 2015-07-30: 125 mL/h via INTRAVENOUS

## 2015-07-28 MED ORDER — CHLORTHALIDONE 25 MG PO TABS
25.0000 mg | ORAL_TABLET | Freq: Every day | ORAL | Status: DC
  Administered 2015-07-29: 25 mg via ORAL
  Filled 2015-07-28: qty 1

## 2015-07-28 MED ORDER — ACETAMINOPHEN 650 MG RE SUPP: 650.0000 mg | Freq: Four times a day (QID) | RECTAL | Status: DC | PRN

## 2015-07-28 MED ORDER — ONDANSETRON HCL 4 MG/2ML IJ SOLN: 4.0000 mg | Freq: Four times a day (QID) | INTRAMUSCULAR | Status: DC | PRN

## 2015-07-28 MED ORDER — CLOPIDOGREL BISULFATE 75 MG PO TABS
75.0000 mg | ORAL_TABLET | Freq: Every day | ORAL | Status: DC
  Administered 2015-07-29 – 2015-07-30 (×2): 75 mg via ORAL
  Filled 2015-07-28 (×2): qty 1

## 2015-07-28 MED ORDER — ONDANSETRON HCL 4 MG/2ML IJ SOLN
4.0000 mg | Freq: Once | INTRAMUSCULAR | Status: AC
  Administered 2015-07-28: 4 mg via INTRAVENOUS
  Filled 2015-07-28: qty 2

## 2015-07-28 MED ORDER — ADULT MULTIVITAMIN W/MINERALS CH
1.0000 | ORAL_TABLET | Freq: Every day | ORAL | Status: DC
  Administered 2015-07-29 – 2015-07-30 (×2): 1 via ORAL
  Filled 2015-07-28 (×2): qty 1

## 2015-07-28 MED ORDER — FINASTERIDE 5 MG PO TABS
5.0000 mg | ORAL_TABLET | Freq: Every day | ORAL | Status: DC
  Administered 2015-07-29 – 2015-07-30 (×2): 5 mg via ORAL
  Filled 2015-07-28 (×2): qty 1

## 2015-07-28 MED ORDER — PALIPERIDONE ER 3 MG PO TB24
6.0000 mg | ORAL_TABLET | Freq: Every day | ORAL | Status: DC
  Administered 2015-07-29 (×2): 6 mg via ORAL
  Filled 2015-07-28 (×3): qty 2

## 2015-07-28 MED ORDER — LINAGLIPTIN 5 MG PO TABS
5.0000 mg | ORAL_TABLET | Freq: Every day | ORAL | Status: DC
  Administered 2015-07-29 – 2015-07-30 (×2): 5 mg via ORAL
  Filled 2015-07-28 (×2): qty 1

## 2015-07-28 MED ORDER — HEPARIN SODIUM (PORCINE) 5000 UNIT/ML IJ SOLN
5000.0000 [IU] | Freq: Three times a day (TID) | INTRAMUSCULAR | Status: DC
  Administered 2015-07-29 (×2): 5000 [IU] via SUBCUTANEOUS
  Filled 2015-07-28 (×2): qty 1

## 2015-07-28 MED ORDER — TAMSULOSIN HCL 0.4 MG PO CAPS
0.4000 mg | ORAL_CAPSULE | Freq: Every day | ORAL | Status: DC
  Administered 2015-07-29 – 2015-07-30 (×2): 0.4 mg via ORAL
  Filled 2015-07-28 (×2): qty 1

## 2015-07-28 MED ORDER — INSULIN ASPART 100 UNIT/ML ~~LOC~~ SOLN
0.0000 [IU] | Freq: Three times a day (TID) | SUBCUTANEOUS | Status: DC
  Administered 2015-07-29: 2 [IU] via SUBCUTANEOUS
  Filled 2015-07-28: qty 2

## 2015-07-28 MED ORDER — MAGNESIUM OXIDE 400 (241.3 MG) MG PO TABS
400.0000 mg | ORAL_TABLET | Freq: Every day | ORAL | Status: DC
  Administered 2015-07-29 – 2015-07-30 (×2): 400 mg via ORAL
  Filled 2015-07-28 (×2): qty 1

## 2015-07-28 MED ORDER — CARVEDILOL 25 MG PO TABS
25.0000 mg | ORAL_TABLET | Freq: Two times a day (BID) | ORAL | Status: DC
  Administered 2015-07-29 – 2015-07-30 (×4): 25 mg via ORAL
  Filled 2015-07-28 (×4): qty 1

## 2015-07-28 MED ORDER — LORAZEPAM 1 MG PO TABS
1.0000 mg | ORAL_TABLET | Freq: Two times a day (BID) | ORAL | Status: DC
  Administered 2015-07-29: 2 mg via ORAL
  Administered 2015-07-29 – 2015-07-30 (×3): 1 mg via ORAL
  Filled 2015-07-28 (×5): qty 1

## 2015-07-28 MED ORDER — TOPIRAMATE 100 MG PO TABS
100.0000 mg | ORAL_TABLET | Freq: Every day | ORAL | Status: DC
Start: 2015-07-29 — End: 2015-07-30
  Administered 2015-07-29 – 2015-07-30 (×2): 100 mg via ORAL
  Filled 2015-07-28 (×2): qty 1

## 2015-07-28 MED ORDER — ONDANSETRON HCL 4 MG PO TABS: 4.0000 mg | ORAL_TABLET | Freq: Four times a day (QID) | ORAL | Status: DC | PRN

## 2015-07-28 MED ORDER — BUPROPION HCL ER (XL) 300 MG PO TB24
300.0000 mg | ORAL_TABLET | Freq: Every day | ORAL | Status: DC
  Administered 2015-07-29 (×2): 300 mg via ORAL
  Filled 2015-07-28 (×2): qty 1

## 2015-07-28 NOTE — ED Provider Notes (Signed)
[facility] Emergency Department Provider Note  ____________________________________________  Time seen: 620 p.m.  I have reviewed the triage vital signs and the nursing notes.   HISTORY  Chief Complaint Fall and Dizziness    HPI Roger Pruitt is a 65 y.o. male brought to the ED due to slurred speech and generalized weakness for the past 2-3 days. Gradual onset and worsening. Patient denies any pain such as headache or other complaints. No dysuria forgets emergency shortness of breath chest pain or coughing.     Past Medical History  Diagnosis Date  . Hematuria     history of gross and microscopic  . Diabetes ([place])   . GERD (gastroesophageal reflux disease)   . Varicose vein of leg   . History of urethral stricture   . Anemia   . Vitamin D deficiency   . HLD (hyperlipidemia)   . HTN (hypertension)   . Lower urinary tract symptoms   . Acute cystitis without hematuria   . Abdominal pain   . Diabetes mellitus     Type II  . Coronary artery disease   . Sleep apnea   . Esophageal reflux   . Disc degeneration, lumbar   . Bipolar disorder ([place])   . Hyperlipidemia   . MI (myocardial infarction) ([place]) 2001;2012;2012  . Arthritis   . BPD (bronchopulmonary dysplasia)   . Panic attack   . Neuropathy ([place])   . Obsessive compulsive disorder   . Carpal tunnel syndrome   . History of bladder infections   . Unspecified vitamin D deficiency   . Other dysphagia   . Iron deficiency anemia, unspecified   . Hypertension   . Hematuria   . Elevated PSA   . History of balanitis   . Nocturia   . Urethral stricture   . Hemorrhoid   . Incomplete bladder emptying   . Urinary retention   . BPH (benign prostatic hyperplasia)      Patient Active Problem List   Diagnosis Date Noted  . UTI (lower urinary tract infection) 07/20/2015  . Hyponatremia 07/20/2015  . Abnormal chest CT 07/10/2015  . Pulmonary nodules 07/09/2015  . Dyspnea 07/09/2015  . Basal  cell carcinoma 05/27/2015  . Bipolar affective disorder ([place]) 05/27/2015  . CAD in native artery 05/27/2015  . Degeneration of intervertebral disc of lumbosacral region 05/27/2015  . GERD (gastroesophageal reflux disease) 05/27/2015  . Benign essential HTN 05/27/2015  . Apnea, sleep 05/27/2015  . 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 Benign prostatic hypertrophy (BPH) with incomplete bladder emptying 11/29/2014  . Acute encephalopathy 08/23/2014  . CVA (cerebral infarction) 08/23/2014  . Hypokalemia 08/23/2014  . Leukocytosis 08/23/2014  . Acute CVA (cerebrovascular accident) ([place]) 08/23/2014  . Hypertensive urgency 01/01/2013  . Hypertension   . Chest pain 09/30/2011  . CAD (coronary artery disease) 09/30/2011  . S/P coronary artery stent placement 09/30/2011  . Hyperlipidemia 09/30/2011  . Diabetes mellitus ([place]) 09/30/2011  . H/O acute myocardial infarction 09/03/2011  . Allergic rhinitis, seasonal 09/03/2011     Past Surgical History  Procedure Laterality Date  . Coronary artery bypass graft      stent placement  . Cosmetic surgery  1967  . Back surgery      lumbar disc  . Tonsillectomy    . Back surgery    . Skin graft    . Carpal tunnel release  2009    left  . Cardiac catheterization  6/10;2011;Aug.2012;Oct. 2012;   . Coronary angioplasty  2011 &  2001    s/p stent  . Cardiac catheterization  05/2012    [facility]; EF 60%, patent stent in the left circumflex, moderate three-vessel disease with no flow-limiting lesions. Unchanged from most recent catheterization  . Cyst removal leg Right      Current Outpatient Rx  Name  Route  Sig  Dispense  Refill  . buPROPion (WELLBUTRIN XL) 300 MG 24 hr tablet   Oral   Take 300 mg by mouth daily.          . Carbamazepine (EQUETRO) 300 MG CP12   Oral   Take 300 mg by mouth 2 (two) times daily.          . carvedilol (COREG) 25 MG tablet   Oral   Take 25 mg by mouth 2 (two) times daily with a meal.         . chlorthalidone  (HYGROTON) 25 MG tablet   Oral   Take 25 mg by mouth daily.          . clopidogrel (PLAVIX) 75 MG tablet   Oral   Take 75 mg by mouth daily.         Marland Kitchen doxepin (SINEQUAN) 25 MG capsule   Oral   Take 50 mg by mouth at bedtime.          . finasteride (PROSCAR) 5 MG tablet   Oral   Take 5 mg by mouth daily.         Marland Kitchen gabapentin (NEURONTIN) 600 MG tablet   Oral   Take 600 mg by mouth 3 (three) times daily.         Marland Kitchen lisinopril (PRINIVIL,ZESTRIL) 40 MG tablet   Oral   Take 1 tablet (40 mg total) by mouth daily.   90 tablet   3   . LORazepam (ATIVAN) 1 MG tablet   Oral   Take 1-2 mg by mouth 2 (two) times daily. Pt takes one tablet in the morning and two at bedtime.         . magnesium oxide (MAG-OX) 400 (241.3 Mg) MG tablet   Oral   Take 400 mg by mouth daily.         . metFORMIN (GLUCOPHAGE) 1000 MG tablet   Oral   Take 1,000 mg by mouth 2 (two) times daily with a meal.         . Multiple Vitamin (MULTIVITAMIN WITH MINERALS) TABS tablet   Oral   Take 1 tablet by mouth daily.         . nitroGLYCERIN (NITROSTAT) 0.4 MG SL tablet   Sublingual   Place 0.4 mg under the tongue every 5 (five) minutes as needed for chest pain.          . paliperidone (INVEGA) 6 MG 24 hr tablet   Oral   Take 6 mg by mouth at bedtime.          . pantoprazole (PROTONIX) 40 MG tablet   Oral   Take 40 mg by mouth daily.         Marland Kitchen EXPIRED: potassium chloride (K-DUR) 10 MEQ tablet   Oral   Take 1 tablet (10 mEq total) by mouth daily.   3 tablet   0   . potassium chloride SA (K-DUR,KLOR-CON) 20 MEQ tablet   Oral   Take 20 mEq by mouth 2 (two) times daily.          . ranolazine (RANEXA) 1000 MG SR tablet   Oral  Take 1,000 mg by mouth 2 (two) times daily.          . sitaGLIPtin (JANUVIA) 100 MG tablet   Oral   Take 100 mg by mouth daily.         . Tamsulosin HCl (FLOMAX) 0.4 MG CAPS   Oral   Take 0.4 mg by mouth daily after breakfast.          .  topiramate (TOPAMAX) 100 MG tablet   Oral   Take 100 mg by mouth daily.         . traZODone (DESYREL) 50 MG tablet   Oral   Take 50 mg by mouth at bedtime as needed for sleep.             Allergies Fioricet-codeine; Morphine and related; Sulfa antibiotics; Synalgos-dc; and Synchlor a   Family History  Problem Relation Age of Onset  . Heart disease    . Heart disease Mother   . Heart disease Brother   . Heart disease Brother   . Prostate cancer Father     Social History Social History  Substance Use Topics  . Smoking status: Former Smoker -- 15 years    Types: Cigarettes    Quit date: 05/18/1999  . Smokeless tobacco: Never Used     Comment: quit 17 + years  . Alcohol Use: No    Review of Systems  Constitutional:   No fever or chills.  Eyes:   No vision changes.  ENT:   No sore throat. No rhinorrhea. Cardiovascular:   No chest pain. Respiratory:   No dyspnea or cough. Gastrointestinal:   Negative for abdominal pain, vomiting and diarrhea. Positive nausea Genitourinary:   Negative for dysuria or difficulty urinating. Musculoskeletal:   Negative for focal pain or swelling Neurological:   Negative for headaches. Positive generalized weakness 10-point ROS otherwise negative.  ____________________________________________   PHYSICAL EXAM:  VITAL SIGNS: ED Triage Vitals  Enc Vitals Group     BP 07/28/15 1811 158/90 mmHg     Pulse Rate 07/28/15 1811 61     Resp 07/28/15 1811 18     Temp 07/28/15 1809 97.5 F (36.4 C)     Temp Source 07/28/15 1811 Oral     SpO2 07/28/15 1811 100 %     Weight 07/28/15 1811 189 lb 1.6 oz (85.775 kg)     Height 07/28/15 1811 6\' 3"  (1.905 m)     Head Cir --      Peak Flow --      Pain Score --      Pain Loc --      Pain Edu? --      Excl. in [place]? --     Vital signs reviewed, nursing assessments reviewed.   Constitutional:   Alert and oriented. Well appearing and in no distress. Eyes:   No scleral icterus. No  conjunctival pallor. PERRL. EOMI.  No nystagmus. ENT   Head:   Normocephalic and atraumatic.   Nose:   No congestion/rhinnorhea. No septal hematoma   Mouth/Throat:   Dry mucous membranes, no pharyngeal erythema. No peritonsillar mass.    Neck:   No stridor. No SubQ emphysema. No meningismus. Hematological/Lymphatic/Immunilogical:   No cervical lymphadenopathy. Cardiovascular:   RRR. Symmetric bilateral radial and DP pulses.  No murmurs.  Respiratory:   Normal respiratory effort without tachypnea nor retractions. Breath sounds are clear and equal bilaterally. No wheezes/rales/rhonchi. Gastrointestinal:   Soft and nontender. Non distended. There is no CVA tenderness.  No rebound, rigidity, or guarding. Genitourinary:   deferred Musculoskeletal:   Nontender with normal range of motion in all extremities. No joint effusions.  No lower extremity tenderness.  No edema. Neurologic:   Dysarthria  CN 2-10 normal except for left facial droop. Global generalized weakness which is symmetric, 1 or 2 out of 5 strength in all major muscle groups Normal tone, normal biceps and patellar tendon reflexes Diminished sensation to sharp pinprick, only reports being able to feel sharp stimulus when applied to his face.. No gross focal neurologic deficits are appreciated.  Skin:    Skin is warm, dry and intact. No rash noted.  No petechiae, purpura, or bullae.  ____________________________________________    LABS (pertinent positives/negatives) (all labs ordered are listed, but only abnormal results are displayed) Labs Reviewed  COMPREHENSIVE METABOLIC PANEL - Abnormal; Notable for the following:    Sodium 126 (*)    Chloride 93 (*)    ALT 15 (*)    All other components within normal limits  CBC WITH DIFFERENTIAL/PLATELET - Abnormal; Notable for the following:    RBC 4.07 (*)    Hemoglobin 12.9 (*)    HCT 36.9 (*)    All other components within normal limits  LIPASE, BLOOD  URINALYSIS  COMPLETEWITH MICROSCOPIC ([facility] ONLY)   ____________________________________________   EKG  Interpreted by me His bradycardia rate of 55, left axis, normal intervals. Slightly prolonged QRS duration of 1:30 milliseconds, normal ST segments and T waves. No acute ischemic changes.  ____________________________________________    RADIOLOGY  CT head unremarkable Chest x-ray unremarkable  ____________________________________________   PROCEDURES   ____________________________________________   INITIAL IMPRESSION / ASSESSMENT AND PLAN / ED COURSE  Pertinent labs & imaging results that were available during my care of the patient were reviewed by me and considered in my medical decision making (see chart for details).  Patient well appearing but with significant neurologic deficits including profound weakness and dysarthria and a left-sided facial droop and diminished sensation. CT scan is unremarkable, and his symptoms are not lateralizing to suggest that this is an acute infarct, but he does have hyponatremia which may produce the symptoms. We'll continue with IV saline fluids for treatment and rehydration. Plan to admit for further management and workup.     ____________________________________________   FINAL CLINICAL IMPRESSION(S) / ED DIAGNOSES  Final diagnoses:  Hyponatremia  Slurred speech  Generalized weakness       Portions of this note were generated with dragon dictation software. Dictation errors may occur despite best attempts at proofreading.   Carrie Mew, MD 07/28/15 2024

## 2015-07-28 NOTE — Progress Notes (Signed)
   Subjective:    Patient ID: Roger Pruitt, male    DOB: [birth year], 65 y.o.   MRN: [id number]  HPI  65 year old male presents the office today for diabetic risk assessment. He states he does get some burning pain to his feeding is taking gabapentin but despite that he still had some numbness, tingling and burning pain. Denies any swelling or redness. No claudication symptoms. He has a states that his nails are elongated causing pressure in shoe gear. Denies any redness or drainage. He has diabetic shoes but no inserts and they are over 36 year old. No other complaints.   Review of Systems  All other systems reviewed and are negative.      Objective:   Physical Exam General: AAO x3, NAD  Dermatological:  Bilateral second, third, fourth digit toenails hypertrophic, dystrophic, discolored. There is tenderness over the nails 3-5 bilaterally. No surrounding redness or drainage. Prior nail avulsions the remainder the nails. No open lesions.   Vascular: Dorsalis Pedis artery and Posterior Tibial artery pedal pulses are 2/4 bilateral with immedate capillary fill time. Pedal hair growth present. No varicosities and no lower extremity edema present bilateral. There is no pain with calf compression, swelling, warmth, erythema.   Neruologic:Vibratory sensation absent, absent sensation with Derrel Nip monofilament.  Musculoskeletal:Cavus foot type is present bilaterally and hammertoes. There is no area pinpoint tenderness is no pain vibratory sensation.  Gait: Unassisted, Nonantalgic.      Assessment & Plan:  65 year old male with symptomatic onychomycosis 6, neuropathy  -Treatment options discussed including all alternatives, risks, and complications -Etiology of symptoms were discussed -Nails debrided 6 without complications or bleeding.   -I do believe that his symptoms are most likely due to neuropathy. Will add compound cream. This is ordered today.  -Paperwork for diabetic shoes was  completed today for precertification. Given his cavus foot, hammertoe neuropathy as well as foot pain of bleeding that he likely benefit from more accommodative orthotic, insert. -Follow-up in 3 months or sooner if any problems arise. In the meantime, encouraged to call the office with any questions, concerns, change in symptoms.   Celesta Gentile, DPM

## 2015-07-28 NOTE — ED Notes (Addendum)
Pt arrived via ems from home after falling. Pt reports falling twice and hitting his head. No visual deformities to head on assessment. Wife reports change in pt condition beginning Sunday with continuous lethargy, dizziness and slurred speech. Pt has trouble staying awake upon assessment and speech is slurred. EMS vitals BP 160/88, HR 58, 02 99%. CBG 95 per ems.

## 2015-07-28 NOTE — ED Notes (Signed)
Pt complains on nausea, requesting medication.

## 2015-07-28 NOTE — ED Notes (Signed)
MD at bedside. 

## 2015-07-29 ENCOUNTER — Inpatient Hospital Stay: Payer: Medicare Other

## 2015-07-29 DIAGNOSIS — R269 Unspecified abnormalities of gait and mobility: Secondary | ICD-10-CM

## 2015-07-29 LAB — URINALYSIS COMPLETE WITH MICROSCOPIC (ARMC ONLY)
Bacteria, UA: NONE SEEN
Bilirubin Urine: NEGATIVE
Glucose, UA: NEGATIVE mg/dL
Ketones, ur: NEGATIVE mg/dL
Leukocytes, UA: NEGATIVE
Nitrite: NEGATIVE
Protein, ur: NEGATIVE mg/dL
Specific Gravity, Urine: 1.005 (ref 1.005–1.030)
Squamous Epithelial / HPF: NONE SEEN
pH: 7 (ref 5.0–8.0)

## 2015-07-29 LAB — HEMOGLOBIN A1C: Hgb A1c MFr Bld: 5.3 % (ref 4.0–6.0)

## 2015-07-29 LAB — SODIUM
Sodium: 128 mmol/L — ABNORMAL LOW (ref 135–145)
Sodium: 128 mmol/L — ABNORMAL LOW (ref 135–145)
Sodium: 129 mmol/L — ABNORMAL LOW (ref 135–145)
Sodium: 130 mmol/L — ABNORMAL LOW (ref 135–145)
Sodium: 135 mmol/L (ref 135–145)

## 2015-07-29 LAB — VITAMIN B12: Vitamin B-12: 202 pg/mL (ref 180–914)

## 2015-07-29 LAB — GLUCOSE, CAPILLARY
Glucose-Capillary: 58 mg/dL — ABNORMAL LOW (ref 65–99)
Glucose-Capillary: 97 mg/dL (ref 65–99)
Glucose-Capillary: 194 mg/dL — ABNORMAL HIGH (ref 65–99)
Glucose-Capillary: 92 mg/dL (ref 65–99)

## 2015-07-29 LAB — TSH: TSH: 0.682 u[IU]/mL (ref 0.350–4.500)

## 2015-07-29 MED ORDER — AMLODIPINE BESYLATE 10 MG PO TABS
10.0000 mg | ORAL_TABLET | Freq: Every day | ORAL | Status: DC
  Administered 2015-07-29: 10 mg via ORAL
  Filled 2015-07-29 (×2): qty 1

## 2015-07-29 MED ORDER — LORAZEPAM 2 MG/ML IJ SOLN
1.0000 mg | INTRAMUSCULAR | Status: AC
  Administered 2015-07-29: 1 mg via INTRAVENOUS
  Filled 2015-07-29: qty 1

## 2015-07-29 MED ORDER — ENOXAPARIN SODIUM 40 MG/0.4ML ~~LOC~~ SOLN
40.0000 mg | SUBCUTANEOUS | Status: DC
  Administered 2015-07-29: 40 mg via SUBCUTANEOUS
  Filled 2015-07-29: qty 0.4

## 2015-07-29 MED ORDER — ACETAMINOPHEN 650 MG RE SUPP: 650.0000 mg | Freq: Four times a day (QID) | RECTAL | Status: DC | PRN

## 2015-07-29 MED ORDER — ACETAMINOPHEN 325 MG PO TABS: 650.0000 mg | ORAL_TABLET | Freq: Four times a day (QID) | ORAL | Status: DC | PRN

## 2015-07-29 MED ORDER — BENZTROPINE MESYLATE 0.5 MG PO TABS
1.0000 mg | ORAL_TABLET | Freq: Every day | ORAL | Status: DC
  Administered 2015-07-29: 1 mg via ORAL
  Filled 2015-07-29: qty 2

## 2015-07-29 NOTE — Progress Notes (Addendum)
[facility] NAME: Roger Pruitt    MR#:  [id number]  DATE OF BIRTH:  1950-08-28  SUBJECTIVE:   Patient presented with Generalized weakness. Patient says that he's had weakness for the past 4 months. He has chronic back pain. There was some concern that he may have slurred speech as well as emergency room. He has no neurological deficits at this time.  REVIEW OF SYSTEMS:    Review of Systems  Constitutional: Positive for malaise/fatigue. Negative for fever and chills.  HENT: Negative for ear discharge, ear pain, hearing loss, nosebleeds and sore throat.   Eyes: Negative for blurred vision and pain.  Respiratory: Negative for cough, hemoptysis, shortness of breath and wheezing.   Cardiovascular: Negative for chest pain, palpitations and leg swelling.  Gastrointestinal: Negative for nausea, vomiting, abdominal pain, diarrhea and blood in stool.  Genitourinary: Negative for dysuria.  Musculoskeletal: Negative for back pain.  Neurological: Positive for weakness. Negative for dizziness, tremors, speech change, focal weakness, seizures and headaches.  Endo/Heme/Allergies: Does not bruise/bleed easily.  Psychiatric/Behavioral: Negative for depression, suicidal ideas and hallucinations.    Tolerating Diet:yes      DRUG ALLERGIES:   Allergies  Allergen Reactions  . Fioricet-Codeine [Butalbital-Apap-Caff-Cod] Nausea And Vomiting  . Morphine And Related Nausea And Vomiting  . Sulfa Antibiotics Nausea And Vomiting  . Synalgos-Dc [Aspirin-Caff-Dihydrocodeine] Itching and Other (See Comments)    Reaction:  Stinging   . Synchlor A [Chlorpheniramine] Rash    VITALS:  Blood pressure 154/92, pulse 63, temperature 97.5 F (36.4 C), temperature source Oral, resp. rate 18, height 6\' 3"  (1.905 m), weight 99.746 kg (219 lb 14.4 oz), SpO2 100 %.  PHYSICAL EXAMINATION:   Physical Exam  Constitutional: He is oriented to person, place, and time  and well-developed, well-nourished, and in no distress. No distress.  HENT:  Head: Normocephalic.  Eyes: No scleral icterus.  Neck: Normal range of motion. Neck supple. No JVD present. No tracheal deviation present.  Cardiovascular: Normal rate, regular rhythm and normal heart sounds.  Exam reveals no gallop and no friction rub.   No murmur heard. Pulmonary/Chest: Effort normal and breath sounds normal. No respiratory distress. He has no wheezes. He has no rales. He exhibits no tenderness.  Abdominal: Soft. Bowel sounds are normal. He exhibits no distension and no mass. There is no tenderness. There is no rebound and no guarding.  Musculoskeletal: Normal range of motion. He exhibits no edema.  Neurological: He is alert and oriented to person, place, and time.  Skin: Skin is warm. No rash noted. No erythema.  Psychiatric: Affect and judgment normal.      LABORATORY PANEL:   CBC  Recent Labs Lab 07/28/15 1928  WBC 6.9  HGB 12.9*  HCT 36.9*  PLT 209   ------------------------------------------------------------------------------------------------------------------  Chemistries   Recent Labs Lab 07/22/15 1159  07/28/15 1928  07/29/15 0529  NA  --   < > 126*  < > 129*  K  --   < > 3.9  --   --   CL  --   < > 93*  --   --   CO2  --   < > 25  --   --   GLUCOSE  --   < > 95  --   --   BUN  --   < > 17  --   --   CREATININE  --   < > 0.94  --   --  CALCIUM  --   < > 9.3  --   --   MG 1.8  --   --   --   --   AST  --   --  20  --   --   ALT  --   --  15*  --   --   ALKPHOS  --   --  38  --   --   BILITOT  --   --  0.6  --   --   < > = values in this interval not displayed. ------------------------------------------------------------------------------------------------------------------  Cardiac Enzymes No results for input(s): TROPONINI in the last 168  hours. ------------------------------------------------------------------------------------------------------------------  RADIOLOGY:  Ct Head Wo Contrast  07/28/2015  CLINICAL DATA:  Post fall hitting head. Lethargy, dizziness and slurred speech. EXAM: CT HEAD WITHOUT CONTRAST TECHNIQUE: Contiguous axial images were obtained from the base of the skull through the vertex without intravenous contrast. COMPARISON:  07/20/2015; 08/23/2014; brain MRI - 08/24/2014 FINDINGS: Similar findings of atrophy with sulcal prominence. Old lacunar infarct within the right basal ganglia (image 13, series 2), unchanged. Scattered periventricular hypodensities compatible microvascular ischemic disease. The gray-white differentiation is otherwise well maintained without CT evidence of acute large territory infarct. No intraparenchymal or extra-axial mass or hemorrhage. Unchanged size and configuration of the ventricles and basilar cisterns. No midline shift. Limited visualization of the paranasal sinuses and mastoid air cells is normal. No air-fluid levels. Debris is noted within in the right external auditory canal. Regional soft tissues appear normal. No displaced calvarial fracture. IMPRESSION: Similar findings of atrophy and microvascular ischemic disease without acute intracranial process. Electronically Signed   By: Sandi Mariscal M.D.   On: 07/28/2015 19:46   Dg Chest Portable 1 View  07/28/2015  CLINICAL DATA:  65 y/o male here with c/o generalized weakness, nausea, dizziness today. Hx HTN - on meds, MI, DM, ex-smoker, CABG, CAD, BPD, cardiac cath EXAM: PORTABLE CHEST 1 VIEW COMPARISON:  08/23/2014 FINDINGS: The heart size and mediastinal contours are within normal limits. Both lungs are clear. The visualized skeletal structures are unremarkable. IMPRESSION: No active disease. Electronically Signed   By: Nolon Nations M.D.   On: 07/28/2015 19:06     ASSESSMENT AND PLAN:   65 year old male who presents with  generalized weakness and found to have hyponatremia with possible slurred speech.  1. Hyponatremia due to hypovolemia: This is likely contributing to generalized weakness. Sodium has improved IV fluids. TSH is within normal limits.   2. Generalized weakness: Follow up on neurology and PT consult. Carbamazepine and gabapentin have been discontinued on admission due to generalized weakness. Obtain B12 level.  3. Type 2 diabetes: Continue sliding scale insulin, Tradjenta, and ADA diet.  4. Essential hypertension: Blood pressure is controlled. Continue lisinopril and Coreg  5. BPH: Continue Proscar   Management plans discussed with the patient and he is in agreement.  CODE STATUS: FULL  TOTAL TIME TAKING CARE OF THIS PATIENT: 30 minutes.     POSSIBLE D/C 1-2 days, DEPENDING ON CLINICAL CONDITION.   Brentlee Sciara M.D on 07/29/2015 at 11:26 AM  Between 7am to 6pm - Pager - [phone] After 6pm go to [email or web address] - password [email or web address] [facility]  Office  [phone]  CC: Primary care physician; Casilda Carls, MD  Note: This dictation was prepared with Dragon dictation along with smaller phrase technology. Any transcriptional errors that result from this process are unintentional.

## 2015-07-29 NOTE — H&P (Addendum)
Roger Pruitt is an 65 y.o. male.   Chief Complaint: Weakness HPI: The patient with past medical history significant for neuropathy, diabetes and coronary artery disease presents emergency department complaining of generalized weakness. There is a report of a fall earlier in the evening. The patient did not strike his head or injure himself but admits to being unable to support his weight over the last 2 weeks. He denies pain anywhere. In the emergency department the patient's voice was garbled and his speech pattern halting but he states this is baseline and actually better than when he "does not take his medicines". He is unclear which medication is the one that makes him speak more clearly. Initial evaluation in emergency department was concerning for a left-sided facial droop. Also the emergency department physician found that the patient has no sensation of pain in his lower extremities bilaterally. Laboratory evaluation showed hyponatremia. The patient's constellations of symptoms were concerning for central nervous system disorder which prompted the emergency department to call for admission.  Past Medical History  Diagnosis Date  . Hematuria     history of gross and microscopic  . Diabetes ([place])   . GERD (gastroesophageal reflux disease)   . Varicose vein of leg   . History of urethral stricture   . Anemia   . Vitamin D deficiency   . HLD (hyperlipidemia)   . HTN (hypertension)   . Lower urinary tract symptoms   . Acute cystitis without hematuria   . Abdominal pain   . Diabetes mellitus     Type II  . Coronary artery disease   . Sleep apnea   . Esophageal reflux   . Disc degeneration, lumbar   . Bipolar disorder ([place])   . Hyperlipidemia   . MI (myocardial infarction) ([place]) 2001;2012;2012  . Arthritis   . BPD (bronchopulmonary dysplasia)   . Panic attack   . Neuropathy ([place])   . Obsessive compulsive disorder   . Carpal tunnel syndrome   . History of bladder infections   .  Unspecified vitamin D deficiency   . Other dysphagia   . Iron deficiency anemia, unspecified   . Hypertension   . Hematuria   . Elevated PSA   . History of balanitis   . Nocturia   . Urethral stricture   . Hemorrhoid   . Incomplete bladder emptying   . Urinary retention   . BPH (benign prostatic hyperplasia)   . CAD (coronary artery disease)     Past Surgical History  Procedure Laterality Date  . Coronary artery bypass graft      stent placement  . Cosmetic surgery  1967  . Back surgery      lumbar disc  . Tonsillectomy    . Back surgery    . Skin graft    . Carpal tunnel release  2009    left  . Cardiac catheterization  6/10;2011;Aug.2012;Oct. 2012;   . Coronary angioplasty  2011 & 2001    s/p stent  . Cardiac catheterization  05/2012    [facility]; EF 60%, patent stent in the left circumflex, moderate three-vessel disease with no flow-limiting lesions. Unchanged from most recent catheterization  . Cyst removal leg Right     Family History  Problem Relation Age of Onset  . Heart disease    . Heart disease Mother   . Heart disease Brother   . Heart disease Brother   . Prostate cancer Father    Social History:  reports that he quit  smoking about 16 years ago. His smoking use included Cigarettes. He quit after 15 years of use. He has never used smokeless tobacco. He reports that he does not drink alcohol or use illicit drugs.  Allergies:  Allergies  Allergen Reactions  . Fioricet-Codeine [Butalbital-Apap-Caff-Cod] Nausea And Vomiting  . Morphine And Related Nausea And Vomiting  . Sulfa Antibiotics Nausea And Vomiting  . Synalgos-Dc [Aspirin-Caff-Dihydrocodeine] Itching and Other (See Comments)    Reaction:  Stinging   . Synchlor A [Chlorpheniramine] Rash    Medications Prior to Admission  Medication Sig Dispense Refill  . buPROPion (WELLBUTRIN XL) 300 MG 24 hr tablet Take 300 mg by mouth at bedtime.     . Carbamazepine (EQUETRO) 300 MG CP12 Take 300 mg by mouth 2  (two) times daily.     . carvedilol (COREG) 25 MG tablet Take 25 mg by mouth 2 (two) times daily with a meal.    . chlorthalidone (HYGROTON) 25 MG tablet Take 25 mg by mouth daily.     . clopidogrel (PLAVIX) 75 MG tablet Take 75 mg by mouth daily.    Marland Kitchen doxepin (SINEQUAN) 25 MG capsule Take 50 mg by mouth at bedtime.     . finasteride (PROSCAR) 5 MG tablet Take 5 mg by mouth daily.    Marland Kitchen gabapentin (NEURONTIN) 600 MG tablet Take 600 mg by mouth 3 (three) times daily.    Marland Kitchen lisinopril (PRINIVIL,ZESTRIL) 40 MG tablet Take 1 tablet (40 mg total) by mouth daily. 90 tablet 3  . LORazepam (ATIVAN) 1 MG tablet Take 1-2 mg by mouth 2 (two) times daily. Pt takes one tablet in the morning and two at bedtime.    . magnesium oxide (MAG-OX) 400 (241.3 Mg) MG tablet Take 400 mg by mouth daily.    . metFORMIN (GLUCOPHAGE) 1000 MG tablet Take 1,000 mg by mouth 2 (two) times daily with a meal.    . Multiple Vitamin (MULTIVITAMIN WITH MINERALS) TABS tablet Take 1 tablet by mouth daily.    . nitroGLYCERIN (NITROSTAT) 0.4 MG SL tablet Place 0.4 mg under the tongue every 5 (five) minutes as needed for chest pain.     . paliperidone (INVEGA) 6 MG 24 hr tablet Take 6 mg by mouth at bedtime.     . pantoprazole (PROTONIX) 40 MG tablet Take 40 mg by mouth daily.    . potassium chloride SA (K-DUR,KLOR-CON) 20 MEQ tablet Take 20 mEq by mouth 2 (two) times daily.    . ranolazine (RANEXA) 1000 MG SR tablet Take 1,000 mg by mouth 2 (two) times daily.     . sitaGLIPtin (JANUVIA) 100 MG tablet Take 100 mg by mouth daily.    . Tamsulosin HCl (FLOMAX) 0.4 MG CAPS Take 0.4 mg by mouth daily after breakfast.     . topiramate (TOPAMAX) 100 MG tablet Take 100 mg by mouth daily.    . traZODone (DESYREL) 50 MG tablet Take 50 mg by mouth at bedtime as needed for sleep.       Results for orders placed or performed during the hospital encounter of 07/28/15 (from the past 48 hour(s))  Comprehensive metabolic panel     Status: Abnormal    Collection Time: 07/28/15  7:28 PM  Result Value Ref Range   Sodium 126 (L) 135 - 145 mmol/L   Potassium 3.9 3.5 - 5.1 mmol/L   Chloride 93 (L) 101 - 111 mmol/L   CO2 25 22 - 32 mmol/L   Glucose, Bld 95 65 - 99  mg/dL   BUN 17 6 - 20 mg/dL   Creatinine, Ser 0.94 0.61 - 1.24 mg/dL   Calcium 9.3 8.9 - 10.3 mg/dL   Total Protein 7.0 6.5 - 8.1 g/dL   Albumin 4.3 3.5 - 5.0 g/dL   AST 20 15 - 41 U/L   ALT 15 (L) 17 - 63 U/L   Alkaline Phosphatase 38 38 - 126 U/L   Total Bilirubin 0.6 0.3 - 1.2 mg/dL   GFR calc non Af Amer >60 >60 mL/min   GFR calc Af Amer >60 >60 mL/min    Comment: (NOTE) The eGFR has been calculated using the CKD EPI equation. This calculation has not been validated in all clinical situations. eGFR's persistently <60 mL/min signify possible Chronic Kidney Disease.    Anion gap 8 5 - 15  Lipase, blood     Status: None   Collection Time: 07/28/15  7:28 PM  Result Value Ref Range   Lipase 20 11 - 51 U/L  CBC with Differential     Status: Abnormal   Collection Time: 07/28/15  7:28 PM  Result Value Ref Range   WBC 6.9 3.8 - 10.6 K/uL   RBC 4.07 (L) 4.40 - 5.90 MIL/uL   Hemoglobin 12.9 (L) 13.0 - 18.0 g/dL   HCT 36.9 (L) 40.0 - 52.0 %   MCV 90.6 80.0 - 100.0 fL   MCH 31.6 26.0 - 34.0 pg   MCHC 34.8 32.0 - 36.0 g/dL   RDW 12.8 11.5 - 14.5 %   Platelets 209 150 - 440 K/uL   Neutrophils Relative % 73 %   Neutro Abs 5.0 1.4 - 6.5 K/uL   Lymphocytes Relative 19 %   Lymphs Abs 1.3 1.0 - 3.6 K/uL   Monocytes Relative 6 %   Monocytes Absolute 0.4 0.2 - 1.0 K/uL   Eosinophils Relative 2 %   Eosinophils Absolute 0.1 0 - 0.7 K/uL   Basophils Relative 0 %   Basophils Absolute 0.0 0 - 0.1 K/uL  Urinalysis complete, with microscopic     Status: Abnormal   Collection Time: 07/28/15 10:52 PM  Result Value Ref Range   Color, Urine YELLOW (A) YELLOW   APPearance CLOUDY (A) CLEAR   Glucose, UA NEGATIVE NEGATIVE mg/dL   Bilirubin Urine NEGATIVE NEGATIVE   Ketones, ur  NEGATIVE NEGATIVE mg/dL   Specific Gravity, Urine 1.015 1.005 - 1.030   Hgb urine dipstick NEGATIVE NEGATIVE   pH 7.0 5.0 - 8.0   Protein, ur NEGATIVE NEGATIVE mg/dL   Nitrite NEGATIVE NEGATIVE   Leukocytes, UA NEGATIVE NEGATIVE   RBC / HPF 0-5 0 - 5 RBC/hpf   WBC, UA 0-5 0 - 5 WBC/hpf   Bacteria, UA NONE SEEN NONE SEEN   Squamous Epithelial / LPF 0-5 (A) NONE SEEN   Budding Yeast PRESENT    Amorphous Crystal PRESENT   Sodium     Status: Abnormal   Collection Time: 07/28/15 11:38 PM  Result Value Ref Range   Sodium 128 (L) 135 - 145 mmol/L  TSH     Status: None   Collection Time: 07/28/15 11:38 PM  Result Value Ref Range   TSH 0.682 0.350 - 4.500 uIU/mL  Hemoglobin A1c     Status: None   Collection Time: 07/28/15 11:38 PM  Result Value Ref Range   Hgb A1c MFr Bld 5.3 4.0 - 6.0 %  Glucose, capillary     Status: None   Collection Time: 07/28/15 11:45 PM  Result Value Ref  Range   Glucose-Capillary 93 65 - 99 mg/dL   Ct Head Wo Contrast  07/28/2015  CLINICAL DATA:  Post fall hitting head. Lethargy, dizziness and slurred speech. EXAM: CT HEAD WITHOUT CONTRAST TECHNIQUE: Contiguous axial images were obtained from the base of the skull through the vertex without intravenous contrast. COMPARISON:  07/20/2015; 08/23/2014; brain MRI - 08/24/2014 FINDINGS: Similar findings of atrophy with sulcal prominence. Old lacunar infarct within the right basal ganglia (image 13, series 2), unchanged. Scattered periventricular hypodensities compatible microvascular ischemic disease. The gray-white differentiation is otherwise well maintained without CT evidence of acute large territory infarct. No intraparenchymal or extra-axial mass or hemorrhage. Unchanged size and configuration of the ventricles and basilar cisterns. No midline shift. Limited visualization of the paranasal sinuses and mastoid air cells is normal. No air-fluid levels. Debris is noted within in the right external auditory canal. Regional  soft tissues appear normal. No displaced calvarial fracture. IMPRESSION: Similar findings of atrophy and microvascular ischemic disease without acute intracranial process. Electronically Signed   By: Sandi Mariscal M.D.   On: 07/28/2015 19:46   Dg Chest Portable 1 View  07/28/2015  CLINICAL DATA:  65 y/o male here with c/o generalized weakness, nausea, dizziness today. Hx HTN - on meds, MI, DM, ex-smoker, CABG, CAD, BPD, cardiac cath EXAM: PORTABLE CHEST 1 VIEW COMPARISON:  08/23/2014 FINDINGS: The heart size and mediastinal contours are within normal limits. Both lungs are clear. The visualized skeletal structures are unremarkable. IMPRESSION: No active disease. Electronically Signed   By: Nolon Nations M.D.   On: 07/28/2015 19:06    Review of Systems  Constitutional: Negative for fever and chills.  HENT: Negative for sore throat and tinnitus.   Eyes: Negative for blurred vision and redness.  Respiratory: Negative for cough and shortness of breath.   Cardiovascular: Negative for chest pain, palpitations and PND.  Gastrointestinal: Negative for nausea, vomiting, abdominal pain and diarrhea.  Genitourinary: Negative for dysuria, urgency and frequency.  Musculoskeletal: Negative for myalgias and joint pain.  Skin: Negative for rash.       No lesions  Neurological: Positive for sensory change and focal weakness. Negative for speech change and weakness.  Endo/Heme/Allergies: Does not bruise/bleed easily.       No temperature intolerance  Psychiatric/Behavioral: Negative for depression and suicidal ideas.    Blood pressure 176/97, pulse 67, temperature 97.9 F (36.6 C), temperature source Oral, resp. rate 18, height 6' 3" (1.905 m), weight 99.474 kg (219 lb 4.8 oz), SpO2 99 %. Physical Exam  Constitutional: He is oriented to person, place, and time. He appears well-developed and well-nourished. No distress.  HENT:  Head: Normocephalic and atraumatic.  Mouth/Throat: Oropharynx is clear and  moist.  Eyes: Conjunctivae and EOM are normal. Pupils are equal, round, and reactive to light. No scleral icterus.  Neck: Normal range of motion. Neck supple. No JVD present. No tracheal deviation present. No thyromegaly present.  Cardiovascular: Normal rate and regular rhythm.  Exam reveals no gallop and no friction rub.   No murmur heard. GI: Soft. Bowel sounds are normal. He exhibits no distension. There is no tenderness.  Genitourinary:  Deferred  Musculoskeletal: Normal range of motion. He exhibits no edema.  Lymphadenopathy:    He has no cervical adenopathy.  Neurological: He is alert and oriented to person, place, and time. He has normal strength. He displays abnormal reflex. A cranial nerve deficit (left facial droop) and sensory deficit (no pin prick sensation lower extremities bilaterally and forearms bilaterally) is  present. Coordination normal. GCS eye subscore is 4. GCS verbal subscore is 5. GCS motor subscore is 6. He displays Babinski's sign on the right side.  Reflex Scores:      Patellar reflexes are 3+ on the right side and 0 on the left side. No response to Babinski on left  Skin: Skin is warm and dry. No rash noted. No erythema.  Psychiatric: He has a normal mood and affect. His behavior is normal. Judgment and thought content normal.     Assessment/Plan This is a 65 year old male admitted for hyponatremia and demyelination syndrome. 1. Hyponatremia: Mild to moderate; surprising that this degree of hyponatremia may have caused his neurologic symptoms. Nonetheless we will rehydrate with intravenous saline as this appears to be hypovolemic hyponatremia. I will check sodium every 6 hours to help prevent central pontine myelinolysis. 2. Demyelination syndrome: As the aforementioned condition is a concern with rapid repletion of sodium, it is unclear why the patient's neurologic presentation has occurred at this time. He has a history of hyponatremia which makes his current serum  sodium level unlikely to be the etiology of his current neuropathy. He has upper motor neuron symptoms. I have ordered and neurology consult. I also placed the patient on Cogentin as some of his antipsychotic medication could be contributing to some of his findings, i.e. speech and hyperreflexia (although notably, the patient does not have extrapyramidal symptoms) and held his gabapentin. I have also held his carbamazepine as the patient has had one seizure in his lifetime when he was a child but was placed on this medication as an adult, presumably due to history of OCD. 3. Diabetes mellitus type 2: Hold oral hypoglycemic medication. I place patient on sliding scale insulin while hospitalized. May continue Tradjenta 4. Coronary artery disease: Stable. Continue aspirin, Plavix, and Ranexa 5. Essential hypertension: Continue chlorthalidone, carvedilol and lisinopril 6. BPH: Continue finasteride and tamsulosin 7. DVT prophylaxis: Heparin 8. GI prophylaxis: Pantoprazole per home regimen The patient is a full code. Time spent on admission orders and patient care proximally 45 minutes  Harrie Foreman, MD 07/29/2015, 2:31 AM

## 2015-07-29 NOTE — Progress Notes (Signed)
BP elevated, Dr Benjie Karvonen notified. Proceed to give am meds and BP meds, recheck in an hour.

## 2015-07-29 NOTE — Progress Notes (Signed)
Wife requesting to speak to neurologist. Dr Doy Mince paged. Waiting on callback.

## 2015-07-29 NOTE — Progress Notes (Signed)
Dr Benjie Karvonen called back, states she will place orders for elevated BP and urine specimen.

## 2015-07-29 NOTE — Progress Notes (Signed)
Dr Benjie Karvonen paged, BP still elevated. Wife also requesting to do urinalysis for recent UTI before hospital admission. Waiting on callback.

## 2015-07-29 NOTE — Progress Notes (Signed)
Dr Doy Mince called back, will come back and speak with wife in a little over an hour, or can call her on a telephone if wife needs to leave sooner. Wife aware.

## 2015-07-29 NOTE — Care Management (Signed)
Readmit per previous RNCM note: Presented to this facility with the diagnosis of UTI. Lives with wife, Vanetta Shawl, [phone]). PCP Dr. Rosario Jacks.. Takes care of all basic activities of daily living himself, drives. No Home Health. Nursing assistants per [facility] and [place] [facility]. [facility] 12:45-3:00pm 2 x week x 1 week. [facility] 12:45 -3:00pm 5 days a week and 3:15pm-4:00pm Saturday and Sunday. [place] in the past. Uses a rolling walker and cane when he thinks about it in the home, [place] x 3 previous visit. Good appetite. Chronic low Sodium problem. Another fall. Unclear about how to use Rx at home according to MD note. May benefit from home health services this time. RNCM will follow patient.

## 2015-07-29 NOTE — Progress Notes (Signed)
PT consult ordered.

## 2015-07-29 NOTE — NC FL2 (Signed)
[place] MEDICAID FL2 LEVEL OF CARE SCREENING TOOL     IDENTIFICATION  Patient Name: Roger Pruitt Birthdate: April 13, 1950 Sex: male Admission Date (Current Location): 07/28/2015  [employer] and [place] Number:  Engineering geologist and Address:  [facility], [place] [address], [place], [place] [address]      Provider Number: [id number]  Attending Physician Name and Address:  Bettey Costa, MD  Relative Name and Phone Number:       Current Level of Care: Hospital Recommended Level of Care: [place] Prior Approval Number:    Date Approved/Denied:   PASRR Number:    Discharge Plan: SNF    Current Diagnoses: Patient Active Problem List   Diagnosis Date Noted  . UTI (lower urinary tract infection) 07/20/2015  . Hyponatremia 07/20/2015  . Abnormal chest CT 07/10/2015  . Pulmonary nodules 07/09/2015  . Dyspnea 07/09/2015  . Basal cell carcinoma 05/27/2015  . Bipolar affective disorder ([place]) 05/27/2015  . CAD in native artery 05/27/2015  . Degeneration of intervertebral disc of lumbosacral region 05/27/2015  . GERD (gastroesophageal reflux disease) 05/27/2015  . Benign essential HTN 05/27/2015  . Apnea, sleep 05/27/2015  . 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 Benign prostatic hypertrophy (BPH) with incomplete bladder emptying 11/29/2014  . Acute encephalopathy 08/23/2014  . CVA (cerebral infarction) 08/23/2014  . Hypokalemia 08/23/2014  . Leukocytosis 08/23/2014  . Acute CVA (cerebrovascular accident) ([place]) 08/23/2014  . Hypertensive urgency 01/01/2013  . Hypertension   . Chest pain 09/30/2011  . CAD (coronary artery disease) 09/30/2011  . S/P coronary artery stent placement 09/30/2011  . Hyperlipidemia 09/30/2011  . Diabetes mellitus ([place]) 09/30/2011  . H/O acute myocardial infarction 09/03/2011  . Allergic rhinitis, seasonal 09/03/2011    Orientation RESPIRATION BLADDER Height & Weight     Self, Time, Situation, Place  Normal Continent  Weight: 219 lb 14.4 oz (99.746 kg) Height:  6\' 3"  ([id number] cm)  BEHAVIORAL SYMPTOMS/MOOD NEUROLOGICAL BOWEL NUTRITION STATUS   (none )  (none ) Continent Diet (Regular Diet )  AMBULATORY STATUS COMMUNICATION OF NEEDS Skin   Extensive Assist Verbally Normal                       Personal Care Assistance Level of Assistance  Bathing, Feeding, Dressing Bathing Assistance: Limited assistance Feeding assistance: Independent Dressing Assistance: Limited assistance     Functional Limitations Info  Sight, Hearing, Speech Sight Info: Adequate Hearing Info: Adequate      SPECIAL CARE FACTORS FREQUENCY  PT (By licensed PT), OT (By licensed OT)     PT Frequency:  (5) OT Frequency:  (5)            Contractures      Additional Factors Info  Code Status, Allergies Code Status Info:  (Full Code. ) Allergies Info:  (Fioricet-codeine, Morphine And Related, Sulfa Antibiotics, Synalgos-dc, Synchlor A)           Current Medications (07/29/2015):  This is the current hospital active medication list Current Facility-Administered Medications  Medication Dose Route Frequency Provider Last Rate Last Dose  . 0.9 %  sodium chloride infusion   Intravenous Continuous Harrie Foreman, MD 125 mL/hr at 07/29/15 [id number]    . acetaminophen (TYLENOL) tablet 650 mg  650 mg Oral Q6H PRN Bettey Costa, MD       Or  . acetaminophen (TYLENOL) suppository 650 mg  650 mg Rectal Q6H PRN Bettey Costa, MD      .  amLODipine (NORVASC) tablet 10 mg  10 mg Oral Daily Bettey Costa, MD   10 mg at 07/29/15 1208  . aspirin EC tablet 81 mg  81 mg Oral Daily Harrie Foreman, MD   81 mg at 07/29/15 0739  . buPROPion (WELLBUTRIN XL) 24 hr tablet 300 mg  300 mg Oral QHS Harrie Foreman, MD   300 mg at 07/29/15 0001  . carvedilol (COREG) tablet 25 mg  25 mg Oral BID WC Harrie Foreman, MD   25 mg at 07/29/15 [id number]  . clopidogrel (PLAVIX) tablet 75 mg  75 mg Oral Daily Harrie Foreman, MD   75 mg at 07/29/15 0739  .  docusate sodium (COLACE) capsule 100 mg  100 mg Oral BID Harrie Foreman, MD   100 mg at 07/29/15 0739  . doxepin (SINEQUAN) capsule 50 mg  50 mg Oral QHS Harrie Foreman, MD   50 mg at 07/29/15 0001  . enoxaparin (LOVENOX) injection 40 mg  40 mg Subcutaneous Q24H Sital Mody, MD      . finasteride (PROSCAR) tablet 5 mg  5 mg Oral Daily Harrie Foreman, MD   5 mg at 07/29/15 0740  . insulin aspart (novoLOG) injection 0-5 Units  0-5 Units Subcutaneous QHS Harrie Foreman, MD   0 Units at 07/28/15 2349  . insulin aspart (novoLOG) injection 0-9 Units  0-9 Units Subcutaneous TID WC Harrie Foreman, MD   0 Units at 07/29/15 0800  . linagliptin (TRADJENTA) tablet 5 mg  5 mg Oral Daily Harrie Foreman, MD   5 mg at 07/29/15 [id number]  . lisinopril (PRINIVIL,ZESTRIL) tablet 40 mg  40 mg Oral Daily Harrie Foreman, MD   40 mg at 07/29/15 0739  . LORazepam (ATIVAN) tablet 1-2 mg  1-2 mg Oral BID Harrie Foreman, MD   1 mg at 07/29/15 0854  . magnesium oxide (MAG-OX) tablet 400 mg  400 mg Oral Daily Harrie Foreman, MD   400 mg at 07/29/15 0739  . multivitamin with minerals tablet 1 tablet  1 tablet Oral Daily Harrie Foreman, MD   1 tablet at 07/29/15 [id number]  . nitroGLYCERIN (NITROSTAT) SL tablet 0.4 mg  0.4 mg Sublingual Q5 min PRN Harrie Foreman, MD      . ondansetron [facility]) tablet 4 mg  4 mg Oral Q6H PRN Harrie Foreman, MD       Or  . ondansetron [facility]) injection 4 mg  4 mg Intravenous Q6H PRN Harrie Foreman, MD      . paliperidone (INVEGA) 24 hr tablet 6 mg  6 mg Oral QHS Harrie Foreman, MD   6 mg at 07/29/15 0001  . pantoprazole (PROTONIX) EC tablet 40 mg  40 mg Oral Daily Harrie Foreman, MD   40 mg at 07/29/15 [id number]  . potassium chloride SA (K-DUR,KLOR-CON) CR tablet 20 mEq  20 mEq Oral BID Harrie Foreman, MD   20 mEq at 07/29/15 0739  . ranolazine (RANEXA) 12 hr tablet 1,000 mg  1,000 mg Oral BID Harrie Foreman, MD   1,000 mg at 07/29/15 0739  . sodium chloride  flush (NS) 0.9 % injection 3 mL  3 mL Intravenous Q12H Harrie Foreman, MD   3 mL at 07/29/15 0018  . tamsulosin (FLOMAX) capsule 0.4 mg  0.4 mg Oral QPC breakfast Harrie Foreman, MD   0.4 mg at 07/29/15 [id number]  . topiramate (TOPAMAX) tablet 100 mg  100 mg Oral Daily Harrie Foreman, MD   100 mg at 07/29/15 0740  . traZODone (DESYREL) tablet 50 mg  50 mg Oral QHS PRN Harrie Foreman, MD         Discharge Medications: Please see discharge summary for a list of discharge medications.  Relevant Imaging Results:  Relevant Lab Results:   Additional Information  (SSN: [id number])  Loralyn Freshwater, LCSW

## 2015-07-29 NOTE — Consult Note (Signed)
Reason for Consult: back pain, lower extremity numbness, questionable facial droop Referring Physician: Mody  CC: back pain, lower extremity numbness, questionable facial droop  HPI: Roger Pruitt is an 65 y.o. male who reports that he has had back pain and lower extremity numbness since the late 1980's.  Was told that he had spinal stenosis and was offered surgery and other options but refused.  Over time his symptoms have slowly progressed and particularly so over the past few months, beng at their worst on the day of admission when he fell three times.  His wife reports that he is just unable to support his weight and she is unable to help him as well.  His wife has also noted that he has had a new facial droop for the past 3-4 days and has had some mild confusion.    Past Medical History  Diagnosis Date  . Hematuria     history of gross and microscopic  . Diabetes ([place])   . GERD (gastroesophageal reflux disease)   . Varicose vein of leg   . History of urethral stricture   . Anemia   . Vitamin D deficiency   . HLD (hyperlipidemia)   . HTN (hypertension)   . Lower urinary tract symptoms   . Acute cystitis without hematuria   . Abdominal pain   . Diabetes mellitus     Type II  . Coronary artery disease   . Sleep apnea   . Esophageal reflux   . Disc degeneration, lumbar   . Bipolar disorder ([place])   . Hyperlipidemia   . MI (myocardial infarction) ([place]) 2001;2012;2012  . Arthritis   . BPD (bronchopulmonary dysplasia)   . Panic attack   . Neuropathy ([place])   . Obsessive compulsive disorder   . Carpal tunnel syndrome   . History of bladder infections   . Unspecified vitamin D deficiency   . Other dysphagia   . Iron deficiency anemia, unspecified   . Hypertension   . Hematuria   . Elevated PSA   . History of balanitis   . Nocturia   . Urethral stricture   . Hemorrhoid   . Incomplete bladder emptying   . Urinary retention   . BPH (benign prostatic hyperplasia)   . CAD  (coronary artery disease)     Past Surgical History  Procedure Laterality Date  . Coronary artery bypass graft      stent placement  . Cosmetic surgery  1967  . Back surgery      lumbar disc  . Tonsillectomy    . Back surgery    . Skin graft    . Carpal tunnel release  2009    left  . Cardiac catheterization  6/10;2011;Aug.2012;Oct. 2012;   . Coronary angioplasty  2011 & 2001    s/p stent  . Cardiac catheterization  05/2012    [facility]; EF 60%, patent stent in the left circumflex, moderate three-vessel disease with no flow-limiting lesions. Unchanged from most recent catheterization  . Cyst removal leg Right     Family History  Problem Relation Age of Onset  . Heart disease    . Heart disease Mother   . Heart disease Brother   . Heart disease Brother   . Prostate cancer Father     Social History:  reports that he quit smoking about 16 years ago. His smoking use included Cigarettes. He quit after 15 years of use. He has never used smokeless tobacco. He reports that he does  not drink alcohol or use illicit drugs.  Allergies  Allergen Reactions  . Fioricet-Codeine [Butalbital-Apap-Caff-Cod] Nausea And Vomiting  . Morphine And Related Nausea And Vomiting  . Sulfa Antibiotics Nausea And Vomiting  . Synalgos-Dc [Aspirin-Caff-Dihydrocodeine] Itching and Other (See Comments)    Reaction:  Stinging   . Synchlor A [Chlorpheniramine] Rash    Medications:  I have reviewed the patient's current medications. Prior to Admission:  Prescriptions prior to admission  Medication Sig Dispense Refill Last Dose  . buPROPion (WELLBUTRIN XL) 300 MG 24 hr tablet Take 300 mg by mouth at bedtime.    07/27/2015 at Unknown time  . Carbamazepine (EQUETRO) 300 MG CP12 Take 300 mg by mouth 2 (two) times daily.    07/28/2015 at Unknown time  . carvedilol (COREG) 25 MG tablet Take 25 mg by mouth 2 (two) times daily with a meal.   07/28/2015 at 0900  . chlorthalidone (HYGROTON) 25 MG tablet Take 25 mg by mouth  daily.    07/28/2015 at Unknown time  . clopidogrel (PLAVIX) 75 MG tablet Take 75 mg by mouth daily.   07/28/2015 at 0900  . doxepin (SINEQUAN) 25 MG capsule Take 50 mg by mouth at bedtime.    07/27/2015 at Unknown time  . finasteride (PROSCAR) 5 MG tablet Take 5 mg by mouth daily.   07/28/2015 at Unknown time  . gabapentin (NEURONTIN) 600 MG tablet Take 600 mg by mouth 3 (three) times daily.   07/28/2015 at Unknown time  . lisinopril (PRINIVIL,ZESTRIL) 40 MG tablet Take 1 tablet (40 mg total) by mouth daily. 90 tablet 3 07/28/2015 at Unknown time  . LORazepam (ATIVAN) 1 MG tablet Take 1-2 mg by mouth 2 (two) times daily. Pt takes one tablet in the morning and two at bedtime.   07/28/2015 at Unknown time  . magnesium oxide (MAG-OX) 400 (241.3 Mg) MG tablet Take 400 mg by mouth daily.   07/28/2015 at Unknown time  . metFORMIN (GLUCOPHAGE) 1000 MG tablet Take 1,000 mg by mouth 2 (two) times daily with a meal.   07/28/2015 at Unknown time  . Multiple Vitamin (MULTIVITAMIN WITH MINERALS) TABS tablet Take 1 tablet by mouth daily.   07/28/2015 at Unknown time  . nitroGLYCERIN (NITROSTAT) 0.4 MG SL tablet Place 0.4 mg under the tongue every 5 (five) minutes as needed for chest pain.    Past Month at Unknown time  . paliperidone (INVEGA) 6 MG 24 hr tablet Take 6 mg by mouth at bedtime.    07/27/2015 at Unknown time  . pantoprazole (PROTONIX) 40 MG tablet Take 40 mg by mouth daily.   07/28/2015 at Unknown time  . potassium chloride SA (K-DUR,KLOR-CON) 20 MEQ tablet Take 20 mEq by mouth 2 (two) times daily.   07/28/2015 at Unknown time  . ranolazine (RANEXA) 1000 MG SR tablet Take 1,000 mg by mouth 2 (two) times daily.    07/28/2015 at Unknown time  . sitaGLIPtin (JANUVIA) 100 MG tablet Take 100 mg by mouth daily.   07/28/2015 at Unknown time  . Tamsulosin HCl (FLOMAX) 0.4 MG CAPS Take 0.4 mg by mouth daily after breakfast.    07/28/2015 at Unknown time  . topiramate (TOPAMAX) 100 MG tablet Take 100 mg by mouth daily.   07/28/2015 at Unknown  time  . traZODone (DESYREL) 50 MG tablet Take 50 mg by mouth at bedtime as needed for sleep.    07/27/2015 at Unknown time   Scheduled: . amLODipine  10 mg Oral Daily  . aspirin  EC  81 mg Oral Daily  . buPROPion  300 mg Oral QHS  . carvedilol  25 mg Oral BID WC  . clopidogrel  75 mg Oral Daily  . docusate sodium  100 mg Oral BID  . doxepin  50 mg Oral QHS  . enoxaparin (LOVENOX) injection  40 mg Subcutaneous Q24H  . finasteride  5 mg Oral Daily  . insulin aspart  0-5 Units Subcutaneous QHS  . insulin aspart  0-9 Units Subcutaneous TID WC  . linagliptin  5 mg Oral Daily  . lisinopril  40 mg Oral Daily  . LORazepam  1-2 mg Oral BID  . magnesium oxide  400 mg Oral Daily  . multivitamin with minerals  1 tablet Oral Daily  . paliperidone  6 mg Oral QHS  . pantoprazole  40 mg Oral Daily  . potassium chloride SA  20 mEq Oral BID  . ranolazine  1,000 mg Oral BID  . sodium chloride flush  3 mL Intravenous Q12H  . tamsulosin  0.4 mg Oral QPC breakfast  . topiramate  100 mg Oral Daily    ROS: History obtained from the patient  General ROS: negative for - chills, fatigue, fever, night sweats, weight gain or weight loss Psychological ROS: negative for - behavioral disorder, hallucinations, memory difficulties, mood swings or suicidal ideation Ophthalmic ROS: negative for - blurry vision, double vision, eye pain or loss of vision ENT ROS: negative for - epistaxis, nasal discharge, oral lesions, sore throat, tinnitus or vertigo Allergy and Immunology ROS: negative for - hives or itchy/watery eyes Hematological and Lymphatic ROS: negative for - bleeding problems, bruising or swollen lymph nodes Endocrine ROS: negative for - galactorrhea, hair pattern changes, polydipsia/polyuria or temperature intolerance Respiratory ROS: negative for - cough, hemoptysis, shortness of breath or wheezing Cardiovascular ROS: negative for - chest pain, dyspnea on exertion, edema or irregular  heartbeat Gastrointestinal ROS: negative for - abdominal pain, diarrhea, hematemesis, nausea/vomiting or stool incontinence Genito-Urinary ROS: recurrent UTI's Musculoskeletal ROS: as noted in HPI Neurological ROS: as noted in HPI Dermatological ROS: negative for rash and skin lesion changes  Physical Examination: Blood pressure 156/102, pulse 68, temperature 97.8 F (36.6 C), temperature source Oral, resp. rate 18, height 6\' 3"  (1.905 m), weight 99.746 kg (219 lb 14.4 oz), SpO2 99 %.  HEENT-  Normocephalic, no lesions, without obvious abnormality.  Normal external eye and conjunctiva.  Normal TM's bilaterally.  Normal auditory canals and external ears. Normal external nose, mucus membranes and septum.  Normal pharynx. Cardiovascular- S1, S2 normal, pulses palpable throughout   Lungs- chest clear, no wheezing, rales, normal symmetric air entry Abdomen- soft, non-tender; bowel sounds normal; no masses,  no organomegaly Extremities- no edema Lymph-no adenopathy palpable Musculoskeletal-no joint tenderness, deformity or swelling Skin-warm and dry, no hyperpigmentation, vitiligo, or suspicious lesions  Neurological Examination Mental Status: Alert, oriented, thought content appropriate.  Speech fluent without evidence of aphasia.  Able to follow 3 step commands without difficulty. Cranial Nerves: II: Discs flat bilaterally; Visual fields grossly normal, pupils equal, round, reactive to light and accommodation III,IV, VI: ptosis not present, extra-ocular motions intact bilaterally V,VII: decrease in the left NLF, facial light touch sensation normal bilaterally VIII: hearing normal bilaterally IX,X: gag reflex present XI: bilateral shoulder shrug XII: midline tongue extension Motor: Right : Upper extremity   5/5    Left:     Upper extremity   5/5  Lower extremity   5/5     Lower extremity   5/5  Tone and bulk:normal tone throughout; no atrophy noted Sensory: Pinprick and light touch  decreased below the knees circumferentially  Deep Tendon Reflexes: 2+ in the upper extremities, 1+ at the knees and absent at the ankles Plantars: Right: mute   Left: mute Cerebellar: Normal finger-to-nose and normal heel-to-shin testing bilaterally Gait: not tested due to safety concerns    Laboratory Studies:   Basic Metabolic Panel:  Recent Labs Lab 07/23/15 1046 07/28/15 1928 07/28/15 2338 07/29/15 0529  NA 132* 126* 128* 129*  K 3.8 3.9  --   --   CL 100* 93*  --   --   CO2 25 25  --   --   GLUCOSE 120* 95  --   --   BUN 18 17  --   --   CREATININE 0.85 0.94  --   --   CALCIUM 8.9 9.3  --   --     Liver Function Tests:  Recent Labs Lab 07/28/15 1928  AST 20  ALT 15*  ALKPHOS 38  BILITOT 0.6  PROT 7.0  ALBUMIN 4.3    Recent Labs Lab 07/28/15 1928  LIPASE 20   No results for input(s): AMMONIA in the last 168 hours.  CBC:  Recent Labs Lab 07/28/15 1928  WBC 6.9  NEUTROABS 5.0  HGB 12.9*  HCT 36.9*  MCV 90.6  PLT 209    Cardiac Enzymes: No results for input(s): CKTOTAL, CKMB, CKMBINDEX, TROPONINI in the last 168 hours.  BNP: Invalid input(s): POCBNP  CBG:  Recent Labs Lab 07/23/15 0717 07/23/15 1132 07/28/15 2345 07/29/15 0729 07/29/15 1106  GLUCAP 94 88 93 58* 97    Microbiology: Results for orders placed or performed during the hospital encounter of 07/20/15  Urine culture     Status: Abnormal   Collection Time: 07/20/15  6:46 PM  Result Value Ref Range Status   Specimen Description URINE, RANDOM  Final   Special Requests NONE  Final   Culture >=100,000 COLONIES/mL PROTEUS MIRABILIS (A)  Final   Report Status 07/23/2015 FINAL  Final   Organism ID, Bacteria PROTEUS MIRABILIS (A)  Final      Susceptibility   Proteus mirabilis - MIC*    AMPICILLIN <=2 SENSITIVE Sensitive     CEFAZOLIN <=4 SENSITIVE Sensitive     CEFTRIAXONE <=1 SENSITIVE Sensitive     CIPROFLOXACIN <=0.25 SENSITIVE Sensitive     GENTAMICIN <=1 SENSITIVE  Sensitive     IMIPENEM 1 SENSITIVE Sensitive     NITROFURANTOIN 256 RESISTANT Resistant     TRIMETH/SULFA <=20 SENSITIVE Sensitive     AMPICILLIN/SULBACTAM <=2 SENSITIVE Sensitive     PIP/TAZO <=4 SENSITIVE Sensitive     * >=100,000 COLONIES/mL PROTEUS MIRABILIS    Coagulation Studies: No results for input(s): LABPROT, INR in the last 72 hours.  Urinalysis:  Recent Labs Lab 07/28/15 2252  COLORURINE YELLOW*  LABSPEC 1.015  PHURINE 7.0  GLUCOSEU NEGATIVE  HGBUR NEGATIVE  BILIRUBINUR NEGATIVE  KETONESUR NEGATIVE  PROTEINUR NEGATIVE  NITRITE NEGATIVE  LEUKOCYTESUR NEGATIVE    Lipid Panel:     Component Value Date/Time   CHOL 187 08/24/2014 0604   CHOL 124 01/31/2014 0448   TRIG 148 08/24/2014 0604   TRIG 117 01/31/2014 0448   HDL 39* 08/24/2014 0604   HDL 25* 01/31/2014 0448   CHOLHDL 4.8 08/24/2014 0604   VLDL 30 08/24/2014 0604   VLDL 23 01/31/2014 0448   LDLCALC 118* 08/24/2014 0604   LDLCALC 76 01/31/2014 0448  HgbA1C:  Lab Results  Component Value Date   HGBA1C 5.3 07/28/2015    Urine Drug Screen:     Component Value Date/Time   LABOPIA NEGATIVE 01/30/2014 0833   COCAINSCRNUR NEGATIVE 01/30/2014 0833   LABBENZ NEGATIVE 01/30/2014 0833   AMPHETMU NEGATIVE 01/30/2014 0833   THCU NEGATIVE 01/30/2014 0833   LABBARB NEGATIVE 01/30/2014 0833    Alcohol Level: No results for input(s): ETH in the last 168 hours.  Other results: EKG: sinus rhythm at 55 bpm.  Imaging: Ct Head Wo Contrast  07/28/2015  CLINICAL DATA:  Post fall hitting head. Lethargy, dizziness and slurred speech. EXAM: CT HEAD WITHOUT CONTRAST TECHNIQUE: Contiguous axial images were obtained from the base of the skull through the vertex without intravenous contrast. COMPARISON:  07/20/2015; 08/23/2014; brain MRI - 08/24/2014 FINDINGS: Similar findings of atrophy with sulcal prominence. Old lacunar infarct within the right basal ganglia (image 13, series 2), unchanged. Scattered  periventricular hypodensities compatible microvascular ischemic disease. The gray-white differentiation is otherwise well maintained without CT evidence of acute large territory infarct. No intraparenchymal or extra-axial mass or hemorrhage. Unchanged size and configuration of the ventricles and basilar cisterns. No midline shift. Limited visualization of the paranasal sinuses and mastoid air cells is normal. No air-fluid levels. Debris is noted within in the right external auditory canal. Regional soft tissues appear normal. No displaced calvarial fracture. IMPRESSION: Similar findings of atrophy and microvascular ischemic disease without acute intracranial process. Electronically Signed   By: Sandi Mariscal M.D.   On: 07/28/2015 19:46   Dg Chest Portable 1 View  07/28/2015  CLINICAL DATA:  65 y/o male here with c/o generalized weakness, nausea, dizziness today. Hx HTN - on meds, MI, DM, ex-smoker, CABG, CAD, BPD, cardiac cath EXAM: PORTABLE CHEST 1 VIEW COMPARISON:  08/23/2014 FINDINGS: The heart size and mediastinal contours are within normal limits. Both lungs are clear. The visualized skeletal structures are unremarkable. IMPRESSION: No active disease. Electronically Signed   By: Nolon Nations M.D.   On: 07/28/2015 19:06     Assessment/Plan: 65 year old male presenting with complaints of low back pain and lower extremity weakness.  Also noted was mild confusion and facial droop.  Patient reports that his face does not look different to him.  Patient with a history of spinal stenosis and it is unclear if his worsening is due to progression of his spinal stenosis or if he has had an acute ischemic event.  Head CT personally reviewed and shows no acute changes.  Further work up recommended.    Recommendations: 1.  MRI of the brain without contrast.  If unremarkable for acute disease, MRI of the lumbar spine indicated.   2.  PT consult  Alexis Goodell, MD Neurology [phone] 07/29/2015, 12:45 PM

## 2015-07-29 NOTE — Progress Notes (Signed)
PT Cancellation Note  Patient Details Name: WARIS MCHAFFIE MRN: [id number] DOB: October 15, 1950   Cancelled Treatment:    Reason Eval/Treat Not Completed: Patient at procedure or test/unavailable.  Nursing reports pt currently off floor for imaging.  Will re-attempt PT eval at a later date/time as medically appropriate.   Raquel Sarna Lewanda Perea 07/29/2015, 2:51 PM Leitha Bleak, [place]

## 2015-07-29 NOTE — Care Management Note (Signed)
Case Management Note  Patient Details  Name: Roger Pruitt MRN: [id number] Date of Birth: 02/22/1951  Subjective/Objective:                  Met with patient and his wife to discuss discharge planning. Patient's wife request to speak with neuro; RN notified. Patient states he has a cane and a front-wheeled walker at home but they live in a trailer and he says there's not enough room to use them. He has been to [facility] in the past and agrees to go again if needed. PT requested during progression meeting. He states that he is taking his home meds and understands how and why he's taking them. He denies difficulty obtaining medications. He has never used home health before. His PCP is Dr. Jadali and he prefers to use [facility] home health if needed. Frequent falls; chronic hyponatremia.   Action/Plan: List of home health care agencies left with patient/wife. RNCM will continue to follow.   Expected Discharge Date:                  Expected Discharge Plan:     In-House Referral:     Discharge planning Services  CM Consult  Post Acute Care Choice:  Home Health Choice offered to:  Patient, Spouse  DME Arranged:    DME Agency:     HH Arranged:    HH Agency:     Status of Service:  In process, will continue to follow  Medicare Important Message Given:    Date Medicare IM Given:    Medicare IM give by:    Date Additional Medicare IM Given:    Additional Medicare Important Message give by:     If discussed at Long Length of Stay Meetings, dates discussed:    Additional Comments:   , RN 07/29/2015, 10:52 AM  

## 2015-07-30 ENCOUNTER — Inpatient Hospital Stay: Payer: Medicare Other

## 2015-07-30 DIAGNOSIS — R29898 Other symptoms and signs involving the musculoskeletal system: Secondary | ICD-10-CM

## 2015-07-30 LAB — CBC
HCT: 33 % — ABNORMAL LOW (ref 40.0–52.0)
Hemoglobin: 11.7 g/dL — ABNORMAL LOW (ref 13.0–18.0)
MCH: 31.6 pg (ref 26.0–34.0)
MCHC: 35.3 g/dL (ref 32.0–36.0)
MCV: 89.5 fL (ref 80.0–100.0)
PLATELETS: 191 10*3/uL (ref 150–440)
RBC: 3.69 MIL/uL — ABNORMAL LOW (ref 4.40–5.90)
RDW: 13.3 % (ref 11.5–14.5)
WBC: 5.4 10*3/uL (ref 3.8–10.6)

## 2015-07-30 LAB — SODIUM
Sodium: 133 mmol/L — ABNORMAL LOW (ref 135–145)
Sodium: 133 mmol/L — ABNORMAL LOW (ref 135–145)

## 2015-07-30 LAB — BASIC METABOLIC PANEL
ANION GAP: 6 (ref 5–15)
BUN: 18 mg/dL (ref 6–20)
CALCIUM: 8.8 mg/dL — AB (ref 8.9–10.3)
CO2: 24 mmol/L (ref 22–32)
CREATININE: 0.89 mg/dL (ref 0.61–1.24)
Chloride: 106 mmol/L (ref 101–111)
GFR calc non Af Amer: >60 mL/min (ref >60)
GLUCOSE: 98 mg/dL (ref 65–99)
POTASSIUM: 3.9 mmol/L (ref 3.5–5.1)
Sodium: 136 mmol/L (ref 135–145)

## 2015-07-30 LAB — GLUCOSE, CAPILLARY
Glucose-Capillary: 101 mg/dL — ABNORMAL HIGH (ref 65–99)
Glucose-Capillary: 84 mg/dL (ref 65–99)
Glucose-Capillary: 94 mg/dL (ref 65–99)

## 2015-07-30 NOTE — Progress Notes (Signed)
Pt sent out via wheelchair to waiting cab

## 2015-07-30 NOTE — Progress Notes (Signed)
Spoke with Dr. Fritzi Mandes to see if the patient needed the continuous fluids. Order received to saline lock the patient.

## 2015-07-30 NOTE — Progress Notes (Signed)
Subjective: Patient unchanged.  To work with therapy today.    Objective: Current vital signs: BP 111/67 mmHg  Pulse 66  Temp(Src) 97.8 F (36.6 C) (Oral)  Resp 16  Ht 6\' 3"  (1.905 m)  Wt 101.152 kg (223 lb)  BMI 27.87 kg/m2  SpO2 96% Vital signs in last 24 hours: Temp:  [97.8 F (36.6 C)-98.3 F (36.8 C)] 97.8 F (36.6 C) (05/04 0741) Pulse Rate:  [64-75] 66 (05/04 0741) Resp:  [16-20] 16 (05/04 0741) BP: (111-156)/(67-103) 111/67 mmHg (05/04 0741) SpO2:  [94 %-100 %] 96 % (05/04 0741) Weight:  [101.152 kg (223 lb)] 101.152 kg (223 lb) (05/04 0500)  Intake/Output from previous day: 05/03 0701 - 05/04 0700 In: 4308.3 [P.O.:1200; I.V.:3108.3] Out: 4400 [Urine:4400] Intake/Output this shift: Total I/O In: 240 [P.O.:240] Out: 500 [Urine:500] Nutritional status: Diet regular Room service appropriate?: Yes; Fluid consistency:: Thin  Neurologic Exam: Mental Status: Alert, oriented, thought content appropriate. Speech fluent without evidence of aphasia. Able to follow 3 step commands without difficulty. Cranial Nerves: II: Discs flat bilaterally; Visual fields grossly normal, pupils equal, round, reactive to light and accommodation III,IV, VI: ptosis not present, extra-ocular motions intact bilaterally V,VII: decrease in the left NLF, facial light touch sensation normal bilaterally VIII: hearing normal bilaterally IX,X: gag reflex present XI: bilateral shoulder shrug XII: midline tongue extension Motor: Right :Upper extremity 5/5Left: Upper extremity 5/5 Lower extremity 5/5Lower extremity 5/5  Lab Results: Basic Metabolic Panel:  Recent Labs Lab 07/23/15 1046 07/28/15 1928  07/29/15 0529 07/29/15 1222 07/29/15 1718 07/29/15 2312 07/30/15 0454  NA 132* 126*  < > 129* 128* 130* 135 136  K 3.8 3.9  --   --   --   --   --  3.9  CL 100* 93*  --   --    --   --   --  106  CO2 25 25  --   --   --   --   --  24  GLUCOSE 120* 95  --   --   --   --   --  98  BUN 18 17  --   --   --   --   --  18  CREATININE 0.85 0.94  --   --   --   --   --  0.89  CALCIUM 8.9 9.3  --   --   --   --   --  8.8*  < > = values in this interval not displayed.  Liver Function Tests:  Recent Labs Lab 07/28/15 1928  AST 20  ALT 15*  ALKPHOS 38  BILITOT 0.6  PROT 7.0  ALBUMIN 4.3    Recent Labs Lab 07/28/15 1928  LIPASE 20   No results for input(s): AMMONIA in the last 168 hours.  CBC:  Recent Labs Lab 07/28/15 1928 07/30/15 0454  WBC 6.9 5.4  NEUTROABS 5.0  --   HGB 12.9* 11.7*  HCT 36.9* 33.0*  MCV 90.6 89.5  PLT 209 191    Cardiac Enzymes: No results for input(s): CKTOTAL, CKMB, CKMBINDEX, TROPONINI in the last 168 hours.  Lipid Panel: No results for input(s): CHOL, TRIG, HDL, CHOLHDL, VLDL, LDLCALC in the last 168 hours.  CBG:  Recent Labs Lab 07/29/15 0729 07/29/15 1106 07/29/15 1618 07/29/15 2153 07/30/15 0757  GLUCAP 58* 97 194* 92 101*    Microbiology: Results for orders placed or performed during the hospital encounter of 07/20/15  Urine culture  Status: Abnormal   Collection Time: 07/20/15  6:46 PM  Result Value Ref Range Status   Specimen Description URINE, RANDOM  Final   Special Requests NONE  Final   Culture >=100,000 COLONIES/mL PROTEUS MIRABILIS (A)  Final   Report Status 07/23/2015 FINAL  Final   Organism ID, Bacteria PROTEUS MIRABILIS (A)  Final      Susceptibility   Proteus mirabilis - MIC*    AMPICILLIN <=2 SENSITIVE Sensitive     CEFAZOLIN <=4 SENSITIVE Sensitive     CEFTRIAXONE <=1 SENSITIVE Sensitive     CIPROFLOXACIN <=0.25 SENSITIVE Sensitive     GENTAMICIN <=1 SENSITIVE Sensitive     IMIPENEM 1 SENSITIVE Sensitive     NITROFURANTOIN 256 RESISTANT Resistant     TRIMETH/SULFA <=20 SENSITIVE Sensitive     AMPICILLIN/SULBACTAM <=2 SENSITIVE Sensitive     PIP/TAZO <=4 SENSITIVE Sensitive      * >=100,000 COLONIES/mL PROTEUS MIRABILIS    Coagulation Studies: No results for input(s): LABPROT, INR in the last 72 hours.  Imaging: Ct Head Wo Contrast  07/28/2015  CLINICAL DATA:  Post fall hitting head. Lethargy, dizziness and slurred speech. EXAM: CT HEAD WITHOUT CONTRAST TECHNIQUE: Contiguous axial images were obtained from the base of the skull through the vertex without intravenous contrast. COMPARISON:  07/20/2015; 08/23/2014; brain MRI - 08/24/2014 FINDINGS: Similar findings of atrophy with sulcal prominence. Old lacunar infarct within the right basal ganglia (image 13, series 2), unchanged. Scattered periventricular hypodensities compatible microvascular ischemic disease. The gray-white differentiation is otherwise well maintained without CT evidence of acute large territory infarct. No intraparenchymal or extra-axial mass or hemorrhage. Unchanged size and configuration of the ventricles and basilar cisterns. No midline shift. Limited visualization of the paranasal sinuses and mastoid air cells is normal. No air-fluid levels. Debris is noted within in the right external auditory canal. Regional soft tissues appear normal. No displaced calvarial fracture. IMPRESSION: Similar findings of atrophy and microvascular ischemic disease without acute intracranial process. Electronically Signed   By: Sandi Mariscal M.D.   On: 07/28/2015 19:46   Mr Brain Wo Contrast  07/29/2015  CLINICAL DATA:  Facial droop with confusion. Inability to walk. Multiple falls. EXAM: MRI HEAD WITHOUT CONTRAST TECHNIQUE: Multiplanar, multiecho pulse sequences of the brain and surrounding structures were obtained without intravenous contrast. COMPARISON:  Prior CT head 07/28/2015.  MR head 08/24/2014. FINDINGS: No evidence for acute infarction, hemorrhage, mass lesion, hydrocephalus, or extra-axial fluid. Global atrophy premature for age. Mild subcortical and periventricular T2 and FLAIR hyperintensities, likely chronic  microvascular ischemic change. Scattered areas of chronic lacunar infarction. Pituitary, pineal, and cerebellar tonsils unremarkable. No upper cervical lesions. Flow voids are maintained throughout the carotid, basilar, and vertebral arteries. There are no areas of chronic hemorrhage. Extracranial soft tissues unremarkable. Compared with priors, similar appearance. IMPRESSION: Chronic changes as described.  No acute intracranial findings. Electronically Signed   By: Staci Righter M.D.   On: 07/29/2015 15:37   Dg Chest Portable 1 View  07/28/2015  CLINICAL DATA:  65 y/o male here with c/o generalized weakness, nausea, dizziness today. Hx HTN - on meds, MI, DM, ex-smoker, CABG, CAD, BPD, cardiac cath EXAM: PORTABLE CHEST 1 VIEW COMPARISON:  08/23/2014 FINDINGS: The heart size and mediastinal contours are within normal limits. Both lungs are clear. The visualized skeletal structures are unremarkable. IMPRESSION: No active disease. Electronically Signed   By: Nolon Nations M.D.   On: 07/28/2015 19:06    Medications:  I have reviewed the patient's current medications. Scheduled: .  amLODipine  10 mg Oral Daily  . aspirin EC  81 mg Oral Daily  . buPROPion  300 mg Oral QHS  . carvedilol  25 mg Oral BID WC  . clopidogrel  75 mg Oral Daily  . docusate sodium  100 mg Oral BID  . doxepin  50 mg Oral QHS  . enoxaparin (LOVENOX) injection  40 mg Subcutaneous Q24H  . finasteride  5 mg Oral Daily  . insulin aspart  0-5 Units Subcutaneous QHS  . insulin aspart  0-9 Units Subcutaneous TID WC  . linagliptin  5 mg Oral Daily  . lisinopril  40 mg Oral Daily  . LORazepam  1-2 mg Oral BID  . magnesium oxide  400 mg Oral Daily  . multivitamin with minerals  1 tablet Oral Daily  . paliperidone  6 mg Oral QHS  . pantoprazole  40 mg Oral Daily  . potassium chloride SA  20 mEq Oral BID  . ranolazine  1,000 mg Oral BID  . sodium chloride flush  3 mL Intravenous Q12H  . tamsulosin  0.4 mg Oral QPC breakfast  .  topiramate  100 mg Oral Daily    Assessment/Plan: Patient unchanged.  MRI of the brain personally reviewed and shows no evidence of acute changes.   Will further investigate complaints with MRI of the lumbar spine.   Recommendations: 1.  Agree with therapy     LOS: 2 days   Alexis Goodell, MD Neurology [phone] 07/30/2015  10:10 AM

## 2015-07-30 NOTE — Care Management (Signed)
Followed up with patient regarding home health needs. He would like to use [facility]. Referral has been sent to [facility] with [facility]. Patient needs nursing for medication management, social work, physical and occupational therapy for home health services. RNCM will continue to follow.

## 2015-07-30 NOTE — Progress Notes (Signed)
Clinical Social Worker (CSW) was following patient for possible SNF placement. PT is recommending home health. Patient walked 400 feet. RN Case Manager is aware of above. Please reconsult if future social work needs arise. CSW signing off.   Blima Rich, LCSW [phone]

## 2015-07-30 NOTE — Care Management Important Message (Signed)
Important Message  Patient Details  Name: Roger Pruitt MRN: [id number] Date of Birth: 02-23-1951   Medicare Important Message Given:  Yes    Juliann Pulse A Camari Quintanilla 07/30/2015, 11:02 AM
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
## 2015-07-30 NOTE — Discharge Summary (Signed)
[facility] NAME: Roger Pruitt    MR#:  [id number]  DATE OF BIRTH:  July 02, 1950  DATE OF ADMISSION:  07/28/2015 ADMITTING PHYSICIAN: Harrie Foreman, MD  DATE OF DISCHARGE: 07/30/15  PRIMARY CARE PHYSICIAN: Casilda Carls, MD    ADMISSION DIAGNOSIS:  Slurred speech [R47.81] Hyponatremia [E87.1] Generalized weakness [R53.1]  DISCHARGE DIAGNOSIS:  Acute hyponatremia-resolved generalized weakness  SECONDARY DIAGNOSIS:   Past Medical History  Diagnosis Date  . Hematuria     history of gross and microscopic  . Diabetes ([place])   . GERD (gastroesophageal reflux disease)   . Varicose vein of leg   . History of urethral stricture   . Anemia   . Vitamin D deficiency   . HLD (hyperlipidemia)   . HTN (hypertension)   . Lower urinary tract symptoms   . Acute cystitis without hematuria   . Abdominal pain   . Diabetes mellitus     Type II  . Coronary artery disease   . Sleep apnea   . Esophageal reflux   . Disc degeneration, lumbar   . Bipolar disorder ([place])   . Hyperlipidemia   . MI (myocardial infarction) ([place]) 2001;2012;2012  . Arthritis   . BPD (bronchopulmonary dysplasia)   . Panic attack   . Neuropathy ([place])   . Obsessive compulsive disorder   . Carpal tunnel syndrome   . History of bladder infections   . Unspecified vitamin D deficiency   . Other dysphagia   . Iron deficiency anemia, unspecified   . Hypertension   . Hematuria   . Elevated PSA   . History of balanitis   . Nocturia   . Urethral stricture   . Hemorrhoid   . Incomplete bladder emptying   . Urinary retention   . BPH (benign prostatic hyperplasia)   . CAD (coronary artery disease)     HOSPITAL COURSE:  65 year old male who presents with generalized weakness and found to have hyponatremia with possible slurred speech.  1. Hyponatremia due to hypovolemia: This is likely contributing to generalized weakness. Sodium has improved IV  fluids. TSH is within normal limits. D/c HCTZ  2. Generalized weakness:  PT recommemd HH for f/u Seen by neurology MRI lumbar spine suggestive of progressive lumbar spinal stenosis.  3. Type 2 diabetes: Continue sliding scale insulin,Tradjenta, and ADA diet.  4. Essential hypertension: Blood pressure is controlled. Continue lisinopril and Coreg  5. BPH: Continue Proscar  Overall improved D/c home with [facility]  CONSULTS OBTAINED:  Treatment Team:  Bettey Costa, MD Alexis Goodell, MD  DRUG ALLERGIES:   Allergies  Allergen Reactions  . Fioricet-Codeine [Butalbital-Apap-Caff-Cod] Nausea And Vomiting  . Morphine And Related Nausea And Vomiting  . Sulfa Antibiotics Nausea And Vomiting  . Synalgos-Dc [Aspirin-Caff-Dihydrocodeine] Itching and Other (See Comments)    Reaction:  Stinging   . Synchlor A [Chlorpheniramine] Rash    DISCHARGE MEDICATIONS:   Current Discharge Medication List    CONTINUE these medications which have NOT CHANGED   Details  buPROPion (WELLBUTRIN XL) 300 MG 24 hr tablet Take 300 mg by mouth at bedtime.     Carbamazepine (EQUETRO) 300 MG CP12 Take 300 mg by mouth 2 (two) times daily.     carvedilol (COREG) 25 MG tablet Take 25 mg by mouth 2 (two) times daily with a meal.    clopidogrel (PLAVIX) 75 MG tablet Take 75 mg by mouth daily.    doxepin (SINEQUAN) 25 MG capsule Take 50  mg by mouth at bedtime.     finasteride (PROSCAR) 5 MG tablet Take 5 mg by mouth daily.    gabapentin (NEURONTIN) 600 MG tablet Take 600 mg by mouth 3 (three) times daily.    lisinopril (PRINIVIL,ZESTRIL) 40 MG tablet Take 1 tablet (40 mg total) by mouth daily. Qty: 90 tablet, Refills: 3    LORazepam (ATIVAN) 1 MG tablet Take 1-2 mg by mouth 2 (two) times daily. Pt takes one tablet in the morning and two at bedtime.    magnesium oxide (MAG-OX) 400 (241.3 Mg) MG tablet Take 400 mg by mouth daily.    metFORMIN (GLUCOPHAGE) 1000 MG tablet Take 1,000 mg by mouth 2 (two) times  daily with a meal.    Multiple Vitamin (MULTIVITAMIN WITH MINERALS) TABS tablet Take 1 tablet by mouth daily.    nitroGLYCERIN (NITROSTAT) 0.4 MG SL tablet Place 0.4 mg under the tongue every 5 (five) minutes as needed for chest pain.     paliperidone (INVEGA) 6 MG 24 hr tablet Take 6 mg by mouth at bedtime.     pantoprazole (PROTONIX) 40 MG tablet Take 40 mg by mouth daily.    potassium chloride SA (K-DUR,KLOR-CON) 20 MEQ tablet Take 20 mEq by mouth 2 (two) times daily.    ranolazine (RANEXA) 1000 MG SR tablet Take 1,000 mg by mouth 2 (two) times daily.     sitaGLIPtin (JANUVIA) 100 MG tablet Take 100 mg by mouth daily.    Tamsulosin HCl (FLOMAX) 0.4 MG CAPS Take 0.4 mg by mouth daily after breakfast.     topiramate (TOPAMAX) 100 MG tablet Take 100 mg by mouth daily.    traZODone (DESYREL) 50 MG tablet Take 50 mg by mouth at bedtime as needed for sleep.       STOP taking these medications     chlorthalidone (HYGROTON) 25 MG tablet         If you experience worsening of your admission symptoms, develop shortness of breath, life threatening emergency, suicidal or homicidal thoughts you must seek medical attention immediately by calling 911 or calling your MD immediately  if symptoms less severe.  You Must read complete instructions/literature along with all the possible adverse reactions/side effects for all the Medicines you take and that have been prescribed to you. Take any new Medicines after you have completely understood and accept all the possible adverse reactions/side effects.   Please note  You were cared for by a hospitalist during your hospital stay. If you have any questions about your discharge medications or the care you received while you were in the hospital after you are discharged, you can call the unit and asked to speak with the hospitalist on call if the hospitalist that took care of you is not available. Once you are discharged, your primary care physician  will handle any further medical issues. Please note that NO REFILLS for any discharge medications will be authorized once you are discharged, as it is imperative that you return to your primary care physician (or establish a relationship with a primary care physician if you do not have one) for your aftercare needs so that they can reassess your need for medications and monitor your lab values. Today   SUBJECTIVE    Feels a lot better today. 8 good meals. VITAL SIGNS:  Blood pressure 110/62, pulse 64, temperature 97.8 F (36.6 C), temperature source Oral, resp. rate 16, height 6\' 3"  (1.905 m), weight 101.152 kg (223 lb), SpO2 96 %.  I/O:  Intake/Output Summary (Last 24 hours) at 07/30/15 1409 Last data filed at 07/30/15 1403  Gross per 24 hour  Intake   2925 ml  Output   3625 ml  Net   -700 ml    PHYSICAL EXAMINATION:  GENERAL:  65 y.o.-year-old patient lying in the bed with no acute distress.  EYES: Pupils equal, round, reactive to light and accommodation. No scleral icterus. Extraocular muscles intact.  HEENT: Head atraumatic, normocephalic. Oropharynx and nasopharynx clear.  NECK:  Supple, no jugular venous distention. No thyroid enlargement, no tenderness.  LUNGS: Normal breath sounds bilaterally, no wheezing, rales,rhonchi or crepitation. No use of accessory muscles of respiration.  CARDIOVASCULAR: S1, S2 normal. No murmurs, rubs, or gallops.  ABDOMEN: Soft, non-tender, non-distended. Bowel sounds present. No organomegaly or mass.  EXTREMITIES: No pedal edema, cyanosis, or clubbing. Chronic deformities due to arthritis in both upper and lower extremity NEUROLOGIC: Cranial nerves II through XII are intact. Muscle strength 5/5 in all extremities. Sensation intact. Gait not checked. Generalized weakness PSYCHIATRIC: The patient is alert and oriented x 3.  SKIN: No obvious rash, lesion, or ulcer.   DATA REVIEW:   CBC   Recent Labs Lab 07/30/15 0454  WBC 5.4  HGB 11.7*   HCT 33.0*  PLT 191    Chemistries   Recent Labs Lab 07/28/15 1928  07/30/15 0454 07/30/15 1118  NA 126*  < > 136 133*  K 3.9  --  3.9  --   CL 93*  --  106  --   CO2 25  --  24  --   GLUCOSE 95  --  98  --   BUN 17  --  18  --   CREATININE 0.94  --  0.89  --   CALCIUM 9.3  --  8.8*  --   AST 20  --   --   --   ALT 15*  --   --   --   ALKPHOS 38  --   --   --   BILITOT 0.6  --   --   --   < > = values in this interval not displayed.  Microbiology Results   Recent Results (from the past 240 hour(s))  Urine culture     Status: Abnormal   Collection Time: 07/20/15  6:46 PM  Result Value Ref Range Status   Specimen Description URINE, RANDOM  Final   Special Requests NONE  Final   Culture >=100,000 COLONIES/mL PROTEUS MIRABILIS (A)  Final   Report Status 07/23/2015 FINAL  Final   Organism ID, Bacteria PROTEUS MIRABILIS (A)  Final      Susceptibility   Proteus mirabilis - MIC*    AMPICILLIN <=2 SENSITIVE Sensitive     CEFAZOLIN <=4 SENSITIVE Sensitive     CEFTRIAXONE <=1 SENSITIVE Sensitive     CIPROFLOXACIN <=0.25 SENSITIVE Sensitive     GENTAMICIN <=1 SENSITIVE Sensitive     IMIPENEM 1 SENSITIVE Sensitive     NITROFURANTOIN 256 RESISTANT Resistant     TRIMETH/SULFA <=20 SENSITIVE Sensitive     AMPICILLIN/SULBACTAM <=2 SENSITIVE Sensitive     PIP/TAZO <=4 SENSITIVE Sensitive     * >=100,000 COLONIES/mL PROTEUS MIRABILIS    RADIOLOGY:  Ct Head Wo Contrast  07/28/2015  CLINICAL DATA:  Post fall hitting head. Lethargy, dizziness and slurred speech. EXAM: CT HEAD WITHOUT CONTRAST TECHNIQUE: Contiguous axial images were obtained from the base of the skull through the vertex without intravenous contrast. COMPARISON:  07/20/2015; 08/23/2014; brain  MRI - 08/24/2014 FINDINGS: Similar findings of atrophy with sulcal prominence. Old lacunar infarct within the right basal ganglia (image 13, series 2), unchanged. Scattered periventricular hypodensities compatible microvascular  ischemic disease. The gray-white differentiation is otherwise well maintained without CT evidence of acute large territory infarct. No intraparenchymal or extra-axial mass or hemorrhage. Unchanged size and configuration of the ventricles and basilar cisterns. No midline shift. Limited visualization of the paranasal sinuses and mastoid air cells is normal. No air-fluid levels. Debris is noted within in the right external auditory canal. Regional soft tissues appear normal. No displaced calvarial fracture. IMPRESSION: Similar findings of atrophy and microvascular ischemic disease without acute intracranial process. Electronically Signed   By: Sandi Mariscal M.D.   On: 07/28/2015 19:46   Mr Brain Wo Contrast  07/29/2015  CLINICAL DATA:  Facial droop with confusion. Inability to walk. Multiple falls. EXAM: MRI HEAD WITHOUT CONTRAST TECHNIQUE: Multiplanar, multiecho pulse sequences of the brain and surrounding structures were obtained without intravenous contrast. COMPARISON:  Prior CT head 07/28/2015.  MR head 08/24/2014. FINDINGS: No evidence for acute infarction, hemorrhage, mass lesion, hydrocephalus, or extra-axial fluid. Global atrophy premature for age. Mild subcortical and periventricular T2 and FLAIR hyperintensities, likely chronic microvascular ischemic change. Scattered areas of chronic lacunar infarction. Pituitary, pineal, and cerebellar tonsils unremarkable. No upper cervical lesions. Flow voids are maintained throughout the carotid, basilar, and vertebral arteries. There are no areas of chronic hemorrhage. Extracranial soft tissues unremarkable. Compared with priors, similar appearance. IMPRESSION: Chronic changes as described.  No acute intracranial findings. Electronically Signed   By: Staci Righter M.D.   On: 07/29/2015 15:37   Mr Lumbar Spine Wo Contrast  07/30/2015  CLINICAL DATA:  Chronic low back pain with bilateral lower extremity weakness and numbness. EXAM: MRI LUMBAR SPINE WITHOUT CONTRAST  TECHNIQUE: Multiplanar, multisequence MR imaging of the lumbar spine was performed. No intravenous contrast was administered. COMPARISON:  CT the abdomen pelvis 06/05/2015. MRI of the lumbar spine 06/11/2004 FINDINGS: Normal signal is present in the conus medullaris which terminates at L1. Heterogeneous fatty infiltration of the marrow is within normal limits for age. Limited imaging the abdomen is unremarkable. There is no significant adenopathy. L1-2:  Mild disc bulging is present without significant stenosis. L2-3: A leftward disc protrusion has progressed. There is potential contact of the exiting left L2 nerve root within the foramen. Mild left subarticular narrowing is evident. L3-4: A mild broad-based disc protrusion has progressed. The central canal and foramina are patent. L4-5: A central and left paramedian disc protrusion is present. This results in moderate subarticular stenosis bilaterally. Mild facet hypertrophy contributes. Mild foraminal narrowing is present bilaterally. L5-S1: There is chronic loss of disc height. A broad-based disc protrusion is present. Mild subarticular narrowing is evident bilaterally. The foramina are patent. IMPRESSION: 1. Progressive central disc protrusion at L4-5 with moderate subarticular and mild bilateral foraminal stenosis. 2. Mild left subarticular and foraminal narrowing at L2-3 is new. 3. Progressive loss of disc height at L5-S1 with mild subarticular narrowing bilaterally but no significant foraminal stenosis. Electronically Signed   By: San Morelle M.D.   On: 07/30/2015 13:17   Dg Chest Portable 1 View  07/28/2015  CLINICAL DATA:  65 y/o male here with c/o generalized weakness, nausea, dizziness today. Hx HTN - on meds, MI, DM, ex-smoker, CABG, CAD, BPD, cardiac cath EXAM: PORTABLE CHEST 1 VIEW COMPARISON:  08/23/2014 FINDINGS: The heart size and mediastinal contours are within normal limits. Both lungs are clear. The visualized skeletal structures  are  unremarkable. IMPRESSION: No active disease. Electronically Signed   By: Nolon Nations M.D.   On: 07/28/2015 19:06     Management plans discussed with the patient, family and they are in agreement.  CODE STATUS:     Code Status Orders        Start     Ordered   07/28/15 2330  Full code   Continuous     07/28/15 2329    Code Status History    Date Active Date Inactive Code Status Order ID Comments User Context   07/21/2015 12:29 AM 07/23/2015  5:33 PM Full Code [id number]  Lance Coon, MD Inpatient   08/23/2014  4:52 PM 08/24/2014  4:09 PM Full Code [id number]  Demetrios Loll, MD Inpatient   01/01/2013  4:19 AM 01/01/2013  8:29 PM Full Code [id number]  Toy Baker, MD Inpatient    Advance Directive Documentation        Most Recent Value   Type of Advance Directive  Healthcare Power of [place], Advance instruction for mental health treatment   Pre-existing out of facility DNR order (yellow form or pink MOST form)     "MOST" Form in Place?        TOTAL TIME TAKING CARE OF THIS PATIENT: 40 minutes.    Enzio Buchler M.D on 07/30/2015 at 2:09 PM  Between 7am to 6pm - Pager - [phone] After 6pm go to [email or web address] - password [email or web address] [facility]  Office  [phone]  CC: Primary care physician; Casilda Carls, MD

## 2015-07-30 NOTE — Care Management (Signed)
Patient discharging to home today. Roger Pruitt with [facility] aware. MD will put in home health orders. No further RNCM needs. Case closed.

## 2015-08-04 DIAGNOSIS — E871 Hypo-osmolality and hyponatremia: Secondary | ICD-10-CM | POA: Diagnosis not present

## 2015-08-05 DIAGNOSIS — D509 Iron deficiency anemia, unspecified: Secondary | ICD-10-CM | POA: Diagnosis not present

## 2015-08-05 DIAGNOSIS — E1140 Type 2 diabetes mellitus with diabetic neuropathy, unspecified: Secondary | ICD-10-CM | POA: Diagnosis not present

## 2015-08-05 DIAGNOSIS — E785 Hyperlipidemia, unspecified: Secondary | ICD-10-CM | POA: Diagnosis not present

## 2015-08-05 DIAGNOSIS — K219 Gastro-esophageal reflux disease without esophagitis: Secondary | ICD-10-CM | POA: Diagnosis not present

## 2015-08-05 DIAGNOSIS — Z951 Presence of aortocoronary bypass graft: Secondary | ICD-10-CM | POA: Diagnosis not present

## 2015-08-05 DIAGNOSIS — N400 Enlarged prostate without lower urinary tract symptoms: Secondary | ICD-10-CM | POA: Diagnosis not present

## 2015-08-05 DIAGNOSIS — Z7902 Long term (current) use of antithrombotics/antiplatelets: Secondary | ICD-10-CM | POA: Diagnosis not present

## 2015-08-05 DIAGNOSIS — F319 Bipolar disorder, unspecified: Secondary | ICD-10-CM | POA: Diagnosis not present

## 2015-08-05 DIAGNOSIS — M5136 Other intervertebral disc degeneration, lumbar region: Secondary | ICD-10-CM | POA: Diagnosis not present

## 2015-08-05 DIAGNOSIS — Z7984 Long term (current) use of oral hypoglycemic drugs: Secondary | ICD-10-CM | POA: Diagnosis not present

## 2015-08-05 DIAGNOSIS — I10 Essential (primary) hypertension: Secondary | ICD-10-CM | POA: Diagnosis not present

## 2015-08-05 DIAGNOSIS — I2510 Atherosclerotic heart disease of native coronary artery without angina pectoris: Secondary | ICD-10-CM | POA: Diagnosis not present

## 2015-08-05 DIAGNOSIS — Z87891 Personal history of nicotine dependence: Secondary | ICD-10-CM | POA: Diagnosis not present

## 2015-08-05 DIAGNOSIS — Z955 Presence of coronary angioplasty implant and graft: Secondary | ICD-10-CM | POA: Diagnosis not present

## 2015-08-05 DIAGNOSIS — I252 Old myocardial infarction: Secondary | ICD-10-CM | POA: Diagnosis not present

## 2015-08-05 DIAGNOSIS — G379 Demyelinating disease of central nervous system, unspecified: Secondary | ICD-10-CM | POA: Diagnosis not present

## 2015-08-05 DIAGNOSIS — Z9181 History of falling: Secondary | ICD-10-CM | POA: Diagnosis not present

## 2015-08-05 DIAGNOSIS — M1990 Unspecified osteoarthritis, unspecified site: Secondary | ICD-10-CM | POA: Diagnosis not present

## 2015-08-07 DIAGNOSIS — E1140 Type 2 diabetes mellitus with diabetic neuropathy, unspecified: Secondary | ICD-10-CM | POA: Diagnosis not present

## 2015-08-07 DIAGNOSIS — M5136 Other intervertebral disc degeneration, lumbar region: Secondary | ICD-10-CM | POA: Diagnosis not present

## 2015-08-07 DIAGNOSIS — I10 Essential (primary) hypertension: Secondary | ICD-10-CM | POA: Diagnosis not present

## 2015-08-07 DIAGNOSIS — Z9181 History of falling: Secondary | ICD-10-CM | POA: Diagnosis not present

## 2015-08-07 DIAGNOSIS — I2510 Atherosclerotic heart disease of native coronary artery without angina pectoris: Secondary | ICD-10-CM | POA: Diagnosis not present

## 2015-08-07 DIAGNOSIS — G379 Demyelinating disease of central nervous system, unspecified: Secondary | ICD-10-CM | POA: Diagnosis not present

## 2015-08-11 DIAGNOSIS — E1140 Type 2 diabetes mellitus with diabetic neuropathy, unspecified: Secondary | ICD-10-CM | POA: Diagnosis not present

## 2015-08-11 DIAGNOSIS — I2510 Atherosclerotic heart disease of native coronary artery without angina pectoris: Secondary | ICD-10-CM | POA: Diagnosis not present

## 2015-08-11 DIAGNOSIS — I10 Essential (primary) hypertension: Secondary | ICD-10-CM | POA: Diagnosis not present

## 2015-08-11 DIAGNOSIS — Z9181 History of falling: Secondary | ICD-10-CM | POA: Diagnosis not present

## 2015-08-11 DIAGNOSIS — G379 Demyelinating disease of central nervous system, unspecified: Secondary | ICD-10-CM | POA: Diagnosis not present

## 2015-08-11 DIAGNOSIS — M5136 Other intervertebral disc degeneration, lumbar region: Secondary | ICD-10-CM | POA: Diagnosis not present

## 2015-08-13 DIAGNOSIS — G379 Demyelinating disease of central nervous system, unspecified: Secondary | ICD-10-CM | POA: Diagnosis not present

## 2015-08-13 DIAGNOSIS — E1140 Type 2 diabetes mellitus with diabetic neuropathy, unspecified: Secondary | ICD-10-CM | POA: Diagnosis not present

## 2015-08-13 DIAGNOSIS — M5136 Other intervertebral disc degeneration, lumbar region: Secondary | ICD-10-CM | POA: Diagnosis not present

## 2015-08-13 DIAGNOSIS — Z9181 History of falling: Secondary | ICD-10-CM | POA: Diagnosis not present

## 2015-08-13 DIAGNOSIS — I10 Essential (primary) hypertension: Secondary | ICD-10-CM | POA: Diagnosis not present

## 2015-08-13 DIAGNOSIS — I2510 Atherosclerotic heart disease of native coronary artery without angina pectoris: Secondary | ICD-10-CM | POA: Diagnosis not present

## 2015-08-14 DIAGNOSIS — G379 Demyelinating disease of central nervous system, unspecified: Secondary | ICD-10-CM | POA: Diagnosis not present

## 2015-08-14 DIAGNOSIS — Z9181 History of falling: Secondary | ICD-10-CM | POA: Diagnosis not present

## 2015-08-14 DIAGNOSIS — E1140 Type 2 diabetes mellitus with diabetic neuropathy, unspecified: Secondary | ICD-10-CM | POA: Diagnosis not present

## 2015-08-14 DIAGNOSIS — M5136 Other intervertebral disc degeneration, lumbar region: Secondary | ICD-10-CM | POA: Diagnosis not present

## 2015-08-14 DIAGNOSIS — I10 Essential (primary) hypertension: Secondary | ICD-10-CM | POA: Diagnosis not present

## 2015-08-14 DIAGNOSIS — I2510 Atherosclerotic heart disease of native coronary artery without angina pectoris: Secondary | ICD-10-CM | POA: Diagnosis not present

## 2015-08-18 ENCOUNTER — Ambulatory Visit (INDEPENDENT_AMBULATORY_CARE_PROVIDER_SITE_OTHER): Payer: Medicare Other | Admitting: Nurse Practitioner

## 2015-08-18 ENCOUNTER — Encounter: Payer: Self-pay | Admitting: Nurse Practitioner

## 2015-08-18 VITALS — BP 132/72 | HR 70 | Ht 75.0 in | Wt 220.8 lb

## 2015-08-18 DIAGNOSIS — I25119 Atherosclerotic heart disease of native coronary artery with unspecified angina pectoris: Secondary | ICD-10-CM | POA: Diagnosis not present

## 2015-08-18 DIAGNOSIS — E782 Mixed hyperlipidemia: Secondary | ICD-10-CM | POA: Insufficient documentation

## 2015-08-18 DIAGNOSIS — E119 Type 2 diabetes mellitus without complications: Secondary | ICD-10-CM | POA: Insufficient documentation

## 2015-08-18 DIAGNOSIS — I119 Hypertensive heart disease without heart failure: Secondary | ICD-10-CM | POA: Diagnosis not present

## 2015-08-18 DIAGNOSIS — E1140 Type 2 diabetes mellitus with diabetic neuropathy, unspecified: Secondary | ICD-10-CM | POA: Diagnosis not present

## 2015-08-18 DIAGNOSIS — I10 Essential (primary) hypertension: Secondary | ICD-10-CM | POA: Diagnosis not present

## 2015-08-18 DIAGNOSIS — M5136 Other intervertebral disc degeneration, lumbar region: Secondary | ICD-10-CM | POA: Diagnosis not present

## 2015-08-18 DIAGNOSIS — E785 Hyperlipidemia, unspecified: Secondary | ICD-10-CM | POA: Diagnosis not present

## 2015-08-18 DIAGNOSIS — Z9181 History of falling: Secondary | ICD-10-CM | POA: Diagnosis not present

## 2015-08-18 DIAGNOSIS — I2510 Atherosclerotic heart disease of native coronary artery without angina pectoris: Secondary | ICD-10-CM | POA: Diagnosis not present

## 2015-08-18 DIAGNOSIS — G379 Demyelinating disease of central nervous system, unspecified: Secondary | ICD-10-CM | POA: Diagnosis not present

## 2015-08-18 NOTE — Progress Notes (Signed)
Office Visit    Patient Name: Roger Pruitt Date of Encounter: 08/18/2015  Primary Care Provider:  Casilda Carls, MD Primary Cardiologist:  Jerilynn Mages. Fletcher Anon, MD   Chief Complaint    65 year old male with prior history of CAD who presents for follow-up.  Past Medical History    Past Medical History  Diagnosis Date  . Hematuria     history of gross and microscopic  . GERD (gastroesophageal reflux disease)   . Varicose vein of leg   . History of urethral stricture   . Anemia   . Vitamin D deficiency   . HLD (hyperlipidemia)   . Lower urinary tract symptoms   . Acute cystitis without hematuria   . Abdominal pain   . Type II diabetes mellitus ([place])   . Sleep apnea   . Esophageal reflux   . Disc degeneration, lumbar   . Bipolar disorder ([place])   . Hyperlipidemia   . Arthritis   . BPD (bronchopulmonary dysplasia)   . Panic attack   . Neuropathy ([place])   . Obsessive compulsive disorder   . Carpal tunnel syndrome   . History of bladder infections   . Unspecified vitamin D deficiency   . Other dysphagia   . Iron deficiency anemia, unspecified   . Hypertensive heart disease   . Hematuria   . Elevated PSA   . History of balanitis   . Nocturia   . Urethral stricture   . Hemorrhoid   . Incomplete bladder emptying   . Urinary retention   . BPH (benign prostatic hyperplasia)   . CAD (coronary artery disease)     a. s/p MI 2001, 2012;  b. s/p prior LCX stenting; b. 05/2012 Abnl MV; c. 05/2012 Cath: patent LCX stent w/ otw mod non-obs dzs->Med Rx.  Marland Kitchen History of echocardiogram     a. 07/2014 Echo: EF 60-65%, no rwma, nl LA size, nl RV/PASP.   Past Surgical History  Procedure Laterality Date  . Coronary artery bypass graft      stent placement  . Cosmetic surgery  1967  . Back surgery      lumbar disc  . Tonsillectomy    . Back surgery    . Skin graft    . Carpal tunnel release  2009    left  . Cardiac catheterization  6/10;2011;Aug.2012;Oct. 2012;   . Coronary angioplasty   2011 & 2001    s/p stent  . Cardiac catheterization  05/2012    [facility]; EF 60%, patent stent in the left circumflex, moderate three-vessel disease with no flow-limiting lesions. Unchanged from most recent catheterization  . Cyst removal leg Right     Allergies  Allergies  Allergen Reactions  . Fioricet-Codeine [Butalbital-Apap-Caff-Cod] Nausea And Vomiting  . Morphine And Related Nausea And Vomiting  . Sulfa Antibiotics Nausea And Vomiting  . Synalgos-Dc [Aspirin-Caff-Dihydrocodeine] Itching and Other (See Comments)    Reaction:  Stinging   . Synchlor A [Chlorpheniramine] Rash    History of Present Illness    65 year old male with the above complex past medical history. Is a history of MI with circumflex stenting in 2012. He also has a history of hypertension, hyperlipidemia, diabetes, bipolar disorder, and spinal stenosis with bilateral lower extremity weakness. His last catheterization was in 2014 revealing patent left circumflex stent with otherwise nonobstructive disease. He was recently hospitalized in early May secondary to profound weakness. He was found to be dehydrated and hyponatremic. Hydrochlorothiazide was discontinued. Because of his leg weakness, an  MRI was performed and showed progressive lumbar spinal stenosis. He was seen by neurology and was subsequently discharged home with home health PT and nursing. Since his discharge, he is done reasonably well. He has continued to have leg weakness but there has been some improvement in this. He notes one brief episode of chest discomfort yesterday that he didn't think too much of them did not take nitroglycerin for. In general, he expresses an episode of chest discomfort about once every 6 months and he feels that overall this has been stable. He denies dyspnea on exertion, PND, orthopnea, syncope or edema. He had been having some lightheadedness but this is improved after changing his gabapentin dose to a lower dose.  Home Medications      Prior to Admission medications   Medication Sig Start Date End Date Taking? Authorizing Provider  buPROPion (WELLBUTRIN XL) 300 MG 24 hr tablet Take 300 mg by mouth at bedtime.    Yes Historical Provider, MD  Carbamazepine (EQUETRO) 300 MG CP12 Take 300 mg by mouth 2 (two) times daily.    Yes Historical Provider, MD  carvedilol (COREG) 25 MG tablet Take 25 mg by mouth 2 (two) times daily with a meal.   Yes Historical Provider, MD  clopidogrel (PLAVIX) 75 MG tablet Take 75 mg by mouth daily.   Yes Historical Provider, MD  doxepin (SINEQUAN) 25 MG capsule Take 50 mg by mouth at bedtime.    Yes Historical Provider, MD  finasteride (PROSCAR) 5 MG tablet Take 5 mg by mouth daily.   Yes Historical Provider, MD  gabapentin (NEURONTIN) 600 MG tablet Take 600 mg by mouth 3 (three) times daily.   Yes Historical Provider, MD  lisinopril (PRINIVIL,ZESTRIL) 40 MG tablet Take 1 tablet (40 mg total) by mouth daily. 10/25/13  Yes Wellington Hampshire, MD  LORazepam (ATIVAN) 1 MG tablet Take 1-2 mg by mouth 2 (two) times daily. Pt takes one tablet in the morning and two at bedtime.   Yes Historical Provider, MD  magnesium oxide (MAG-OX) 400 (241.3 Mg) MG tablet Take 400 mg by mouth daily.   Yes Historical Provider, MD  metFORMIN (GLUCOPHAGE) 1000 MG tablet Take 1,000 mg by mouth 2 (two) times daily with a meal.   Yes Historical Provider, MD  Multiple Vitamin (MULTIVITAMIN WITH MINERALS) TABS tablet Take 1 tablet by mouth daily.   Yes Historical Provider, MD  nitroGLYCERIN (NITROSTAT) 0.4 MG SL tablet Place 0.4 mg under the tongue every 5 (five) minutes as needed for chest pain.    Yes Historical Provider, MD  paliperidone (INVEGA) 6 MG 24 hr tablet Take 6 mg by mouth at bedtime.    Yes Historical Provider, MD  pantoprazole (PROTONIX) 40 MG tablet Take 40 mg by mouth daily.   Yes Historical Provider, MD  potassium chloride SA (K-DUR,KLOR-CON) 20 MEQ tablet Take 20 mEq by mouth 2 (two) times daily.   Yes Historical  Provider, MD  ranolazine (RANEXA) 1000 MG SR tablet Take 1,000 mg by mouth 2 (two) times daily.    Yes Historical Provider, MD  sitaGLIPtin (JANUVIA) 100 MG tablet Take 100 mg by mouth daily.   Yes Historical Provider, MD  Tamsulosin HCl (FLOMAX) 0.4 MG CAPS Take 0.4 mg by mouth daily after breakfast.    Yes Historical Provider, MD  topiramate (TOPAMAX) 100 MG tablet Take 100 mg by mouth daily.   Yes Historical Provider, MD  traZODone (DESYREL) 50 MG tablet Take 50 mg by mouth at bedtime as needed for  sleep.    Yes Historical Provider, MD    Review of Systems    As above, he continues to have some degree of bilateral lower extremity weakness.  He experiences an episode of chest discomfort about once every 6 months and did have a brief episode yesterday. He recently had some lightheadedness that was found to be attributable to his gabapentin dose. This has since been reduced. He denies dyspnea, PND, orthopnea, syncope, edema, or early satiety.  All other systems reviewed and are otherwise negative except as noted above.  Physical Exam    VS:  BP 132/72 mmHg  Pulse 70  Ht 6\' 3"  (1.905 m)  Wt 220 lb 12.8 oz (100.154 kg)  BMI 27.60 kg/m2  SpO2 98% , BMI Body mass index is 27.6 kg/(m^2). GEN: Well nourished, well developed, in no acute distress. HEENT: normal. Neck: Supple, no JVD, carotid bruits, or masses. Cardiac: RRR, 2/6 systolic murmur at the left lower sternal border to the apex. No rubs, or gallops. No clubbing, cyanosis, edema.  Radials/DP/PT 2+ and equal bilaterally.  Respiratory:  Respirations regular and unlabored, clear to auscultation bilaterally. GI: Soft, nontender, nondistended, BS + x 4. MS: no deformity or atrophy. Skin: warm and dry, no rash. Neuro:  Strength and sensation are intact. Psych: Normal affect.  Accessory Clinical Findings    None  Assessment & Plan    1.  Coronary artery disease: Patient status post prior left circumflex stenting with stable anatomy on  catheterization 2014. He has some degree of chronic stable angina, expressing chest discomfort about once every 6 months. He did have a brief episode of chest discomfort yesterday but did not think too much of it and did not require glycerin. He has not had any chest discomfort in the setting of recent physical therapy. I recommended continued medical therapy with beta blocker, ACE inhibitor, Ranexa, and Plavix. He has not been on aspirin in many years. He says he is also on a statin but doesn't know the name and will call us back with that information.  2. Hypertensive heart disease: Stable on beta blocker and ACE inhibitor therapy.  3. Dehydration/hyponatremia: Status post recent hospitalization during which HCTZ was discontinued. He has been improving his hydration status at home and his strength has been improving.  4. Hyperlipidemia: LDL was 118 on May 29. He says he takes a statin at home but this is not on his medicine list. He and his wife will go home and review his medicine list and call us with that information.  5. Disposition: Follow-up with Dr. Fletcher Anon in 6 months or sooner if necessary.   Murray Hodgkins, NP 08/18/2015, 10:33 AM

## 2015-08-18 NOTE — Patient Instructions (Signed)
Medication Instructions:  Your physician recommends that you continue on your current medications as directed. Please refer to the Current Medication list given to you today.   Labwork: None ordered  Testing/Procedures: None ordered  Follow-Up: Your physician wants you to follow-up in: 6 months with Dr.Arida You will receive a reminder letter in the mail two months in advance. If you don't receive a letter, please call our office to schedule the follow-up appointment.   Any Other Special Instructions Will Be Listed Below (If Applicable).     If you need a refill on your cardiac medications before your next appointment, please call your pharmacy.   

## 2015-08-19 DIAGNOSIS — E1140 Type 2 diabetes mellitus with diabetic neuropathy, unspecified: Secondary | ICD-10-CM | POA: Diagnosis not present

## 2015-08-19 DIAGNOSIS — G379 Demyelinating disease of central nervous system, unspecified: Secondary | ICD-10-CM | POA: Diagnosis not present

## 2015-08-19 DIAGNOSIS — Z9181 History of falling: Secondary | ICD-10-CM | POA: Diagnosis not present

## 2015-08-19 DIAGNOSIS — I2510 Atherosclerotic heart disease of native coronary artery without angina pectoris: Secondary | ICD-10-CM | POA: Diagnosis not present

## 2015-08-19 DIAGNOSIS — M5136 Other intervertebral disc degeneration, lumbar region: Secondary | ICD-10-CM | POA: Diagnosis not present

## 2015-08-19 DIAGNOSIS — I10 Essential (primary) hypertension: Secondary | ICD-10-CM | POA: Diagnosis not present

## 2015-08-20 DIAGNOSIS — M5136 Other intervertebral disc degeneration, lumbar region: Secondary | ICD-10-CM | POA: Diagnosis not present

## 2015-08-20 DIAGNOSIS — G379 Demyelinating disease of central nervous system, unspecified: Secondary | ICD-10-CM | POA: Diagnosis not present

## 2015-08-20 DIAGNOSIS — Z9181 History of falling: Secondary | ICD-10-CM | POA: Diagnosis not present

## 2015-08-20 DIAGNOSIS — I10 Essential (primary) hypertension: Secondary | ICD-10-CM | POA: Diagnosis not present

## 2015-08-20 DIAGNOSIS — E1140 Type 2 diabetes mellitus with diabetic neuropathy, unspecified: Secondary | ICD-10-CM | POA: Diagnosis not present

## 2015-08-20 DIAGNOSIS — I2510 Atherosclerotic heart disease of native coronary artery without angina pectoris: Secondary | ICD-10-CM | POA: Diagnosis not present

## 2015-08-26 DIAGNOSIS — I10 Essential (primary) hypertension: Secondary | ICD-10-CM | POA: Diagnosis not present

## 2015-08-26 DIAGNOSIS — G379 Demyelinating disease of central nervous system, unspecified: Secondary | ICD-10-CM | POA: Diagnosis not present

## 2015-08-26 DIAGNOSIS — Z9181 History of falling: Secondary | ICD-10-CM | POA: Diagnosis not present

## 2015-08-26 DIAGNOSIS — M5136 Other intervertebral disc degeneration, lumbar region: Secondary | ICD-10-CM | POA: Diagnosis not present

## 2015-08-26 DIAGNOSIS — I2510 Atherosclerotic heart disease of native coronary artery without angina pectoris: Secondary | ICD-10-CM | POA: Diagnosis not present

## 2015-08-26 DIAGNOSIS — E1140 Type 2 diabetes mellitus with diabetic neuropathy, unspecified: Secondary | ICD-10-CM | POA: Diagnosis not present

## 2015-08-28 DIAGNOSIS — Z9181 History of falling: Secondary | ICD-10-CM | POA: Diagnosis not present

## 2015-08-28 DIAGNOSIS — I2510 Atherosclerotic heart disease of native coronary artery without angina pectoris: Secondary | ICD-10-CM | POA: Diagnosis not present

## 2015-08-28 DIAGNOSIS — G379 Demyelinating disease of central nervous system, unspecified: Secondary | ICD-10-CM | POA: Diagnosis not present

## 2015-08-28 DIAGNOSIS — M5136 Other intervertebral disc degeneration, lumbar region: Secondary | ICD-10-CM | POA: Diagnosis not present

## 2015-08-28 DIAGNOSIS — E1140 Type 2 diabetes mellitus with diabetic neuropathy, unspecified: Secondary | ICD-10-CM | POA: Diagnosis not present

## 2015-08-28 DIAGNOSIS — I10 Essential (primary) hypertension: Secondary | ICD-10-CM | POA: Diagnosis not present

## 2015-08-31 DIAGNOSIS — F3132 Bipolar disorder, current episode depressed, moderate: Secondary | ICD-10-CM | POA: Diagnosis not present

## 2015-09-02 ENCOUNTER — Ambulatory Visit (INDEPENDENT_AMBULATORY_CARE_PROVIDER_SITE_OTHER): Payer: Medicare Other | Admitting: *Deleted

## 2015-09-02 DIAGNOSIS — M216X2 Other acquired deformities of left foot: Secondary | ICD-10-CM

## 2015-09-02 DIAGNOSIS — M5136 Other intervertebral disc degeneration, lumbar region: Secondary | ICD-10-CM | POA: Diagnosis not present

## 2015-09-02 DIAGNOSIS — M2040 Other hammer toe(s) (acquired), unspecified foot: Secondary | ICD-10-CM

## 2015-09-02 DIAGNOSIS — E1149 Type 2 diabetes mellitus with other diabetic neurological complication: Secondary | ICD-10-CM

## 2015-09-02 DIAGNOSIS — Z9181 History of falling: Secondary | ICD-10-CM | POA: Diagnosis not present

## 2015-09-02 DIAGNOSIS — I10 Essential (primary) hypertension: Secondary | ICD-10-CM | POA: Diagnosis not present

## 2015-09-02 DIAGNOSIS — G379 Demyelinating disease of central nervous system, unspecified: Secondary | ICD-10-CM | POA: Diagnosis not present

## 2015-09-02 DIAGNOSIS — E1140 Type 2 diabetes mellitus with diabetic neuropathy, unspecified: Secondary | ICD-10-CM | POA: Diagnosis not present

## 2015-09-02 DIAGNOSIS — I2510 Atherosclerotic heart disease of native coronary artery without angina pectoris: Secondary | ICD-10-CM | POA: Diagnosis not present

## 2015-09-07 ENCOUNTER — Ambulatory Visit: Payer: Medicare Other

## 2015-09-07 ENCOUNTER — Ambulatory Visit: Payer: Self-pay

## 2015-09-08 ENCOUNTER — Other Ambulatory Visit: Payer: Self-pay | Admitting: Cardiovascular Disease

## 2015-09-09 ENCOUNTER — Ambulatory Visit: Payer: Medicare Other | Admitting: Urology

## 2015-09-09 DIAGNOSIS — F250 Schizoaffective disorder, bipolar type: Secondary | ICD-10-CM | POA: Diagnosis not present

## 2015-09-10 ENCOUNTER — Ambulatory Visit: Payer: Self-pay | Admitting: Internal Medicine

## 2015-09-11 NOTE — Progress Notes (Signed)
Measured for diabetic shoes and insoles. 

## 2015-09-14 ENCOUNTER — Telehealth: Payer: Self-pay | Admitting: Internal Medicine

## 2015-09-14 DIAGNOSIS — R5383 Other fatigue: Secondary | ICD-10-CM | POA: Diagnosis not present

## 2015-09-14 DIAGNOSIS — R42 Dizziness and giddiness: Secondary | ICD-10-CM | POA: Diagnosis not present

## 2015-09-14 NOTE — Telephone Encounter (Signed)
Called and spoke with patient and he has been r/s for SMW, PFT, CT Chest and ROV with Dr. Stevenson Clinch.  CT Chest is scheduled for Friday 10/23/15 at 1:00 pm at [facility], pt to arrive at [place] at 12:45., SMW, PFT scheduled after that at 3:00 at [facility], Follow Up appointment with Dr. Stevenson Clinch Monday 10/27/15. Information given to patient and also mailed out appointment card, CT Chest Appointment and SMW, PFT instructions to patient. Pt is aware and aware to contact us sooner if he needs [place] before these appointments. Pt voiced understanding and thanked me for my time.  Information placed in mail. Rhonda J Cobb

## 2015-09-14 NOTE — Telephone Encounter (Signed)
Pt calling would like to reschedule the CT he cancelled last week but would like to also reschedule the PFT and 6 minute walk test. He would like to have them back to back like he had it before Please advise.

## 2015-09-16 DIAGNOSIS — F3132 Bipolar disorder, current episode depressed, moderate: Secondary | ICD-10-CM | POA: Diagnosis not present

## 2015-09-23 DIAGNOSIS — F3132 Bipolar disorder, current episode depressed, moderate: Secondary | ICD-10-CM | POA: Diagnosis not present

## 2015-09-30 ENCOUNTER — Ambulatory Visit (INDEPENDENT_AMBULATORY_CARE_PROVIDER_SITE_OTHER): Payer: Medicare Other | Admitting: Podiatry

## 2015-09-30 DIAGNOSIS — M2040 Other hammer toe(s) (acquired), unspecified foot: Secondary | ICD-10-CM | POA: Diagnosis not present

## 2015-09-30 DIAGNOSIS — E1149 Type 2 diabetes mellitus with other diabetic neurological complication: Secondary | ICD-10-CM

## 2015-10-02 ENCOUNTER — Encounter: Payer: Self-pay | Admitting: Emergency Medicine

## 2015-10-02 ENCOUNTER — Ambulatory Visit
Admission: EM | Admit: 2015-10-02 | Discharge: 2015-10-02 | Disposition: A | Discharge status: Home / Self Care | Payer: Medicare Other | Attending: Family Medicine | Admitting: Family Medicine

## 2015-10-02 DIAGNOSIS — R300 Dysuria: Secondary | ICD-10-CM | POA: Diagnosis present

## 2015-10-02 DIAGNOSIS — E559 Vitamin D deficiency, unspecified: Secondary | ICD-10-CM | POA: Insufficient documentation

## 2015-10-02 DIAGNOSIS — K219 Gastro-esophageal reflux disease without esophagitis: Secondary | ICD-10-CM | POA: Diagnosis not present

## 2015-10-02 DIAGNOSIS — N401 Enlarged prostate with lower urinary tract symptoms: Secondary | ICD-10-CM | POA: Insufficient documentation

## 2015-10-02 DIAGNOSIS — E119 Type 2 diabetes mellitus without complications: Secondary | ICD-10-CM | POA: Diagnosis not present

## 2015-10-02 DIAGNOSIS — Z87891 Personal history of nicotine dependence: Secondary | ICD-10-CM | POA: Insufficient documentation

## 2015-10-02 DIAGNOSIS — G4730 Sleep apnea, unspecified: Secondary | ICD-10-CM | POA: Diagnosis not present

## 2015-10-02 DIAGNOSIS — Z79899 Other long term (current) drug therapy: Secondary | ICD-10-CM | POA: Insufficient documentation

## 2015-10-02 DIAGNOSIS — N390 Urinary tract infection, site not specified: Secondary | ICD-10-CM

## 2015-10-02 DIAGNOSIS — F319 Bipolar disorder, unspecified: Secondary | ICD-10-CM | POA: Insufficient documentation

## 2015-10-02 DIAGNOSIS — I2510 Atherosclerotic heart disease of native coronary artery without angina pectoris: Secondary | ICD-10-CM | POA: Insufficient documentation

## 2015-10-02 DIAGNOSIS — Z885 Allergy status to narcotic agent status: Secondary | ICD-10-CM | POA: Diagnosis not present

## 2015-10-02 DIAGNOSIS — E785 Hyperlipidemia, unspecified: Secondary | ICD-10-CM | POA: Insufficient documentation

## 2015-10-02 DIAGNOSIS — Z7984 Long term (current) use of oral hypoglycemic drugs: Secondary | ICD-10-CM | POA: Insufficient documentation

## 2015-10-02 DIAGNOSIS — Z882 Allergy status to sulfonamides status: Secondary | ICD-10-CM | POA: Insufficient documentation

## 2015-10-02 DIAGNOSIS — F429 Obsessive-compulsive disorder, unspecified: Secondary | ICD-10-CM | POA: Diagnosis not present

## 2015-10-02 DIAGNOSIS — G5600 Carpal tunnel syndrome, unspecified upper limb: Secondary | ICD-10-CM | POA: Diagnosis not present

## 2015-10-02 LAB — URINALYSIS COMPLETE WITH MICROSCOPIC (ARMC ONLY)
Glucose, UA: NEGATIVE mg/dL
Ketones, ur: NEGATIVE mg/dL
Nitrite: NEGATIVE
PH: 7 (ref 5.0–8.0)
PROTEIN: NEGATIVE mg/dL
Specific Gravity, Urine: 1.015 (ref 1.005–1.030)

## 2015-10-02 MED ORDER — CIPROFLOXACIN HCL 500 MG PO TABS: 500.0000 mg | ORAL_TABLET | Freq: Two times a day (BID) | ORAL | Status: DC

## 2015-10-02 NOTE — Discharge Instructions (Signed)

## 2015-10-02 NOTE — ED Notes (Signed)
Patient reports urinary frequency and burning when urinating for a week.  Patient denies fevers.  Patient denies N/V.

## 2015-10-02 NOTE — ED Provider Notes (Signed)
CSN: [id number]     Arrival date & time 10/02/15  [id number] History   First MD Initiated Contact with Patient 10/02/15 [phone]     Chief Complaint  Patient presents with  . Dysuria   (Consider location/radiation/quality/duration/timing/severity/associated sxs/prior Treatment) HPI  This is a 65 year old gentleman who presents with a one-week history of urinary frequency and burning. He states that he has not had any fever or chills. He said no penile discharge. His urinary stream seems to be normal slightly diminished at times. He has had some mild low back pain. His past medical history he has recurrent UTIs has history of urinary stricture and urinary retention which seems to be dispose him to UTIs. Has been hospitalized for these in the past. He does not appear toxic today. He is allergic to sulfa.      Past Medical History  Diagnosis Date  . Hematuria     history of gross and microscopic  . GERD (gastroesophageal reflux disease)   . Varicose vein of leg   . History of urethral stricture   . Anemia   . Vitamin D deficiency   . HLD (hyperlipidemia)   . Lower urinary tract symptoms   . Acute cystitis without hematuria   . Abdominal pain   . Type II diabetes mellitus ([place])   . Sleep apnea   . Esophageal reflux   . Disc degeneration, lumbar   . Bipolar disorder ([place])   . Hyperlipidemia   . Arthritis   . BPD (bronchopulmonary dysplasia)   . Panic attack   . Neuropathy ([place])   . Obsessive compulsive disorder   . Carpal tunnel syndrome   . History of bladder infections   . Unspecified vitamin D deficiency   . Other dysphagia   . Iron deficiency anemia, unspecified   . Hypertensive heart disease   . Hematuria   . Elevated PSA   . History of balanitis   . Nocturia   . Urethral stricture   . Hemorrhoid   . Incomplete bladder emptying   . Urinary retention   . BPH (benign prostatic hyperplasia)   . CAD (coronary artery disease)     a. s/p MI 2001, 2012;  b. s/p prior LCX  stenting; b. 05/2012 Abnl MV; c. 05/2012 Cath: patent LCX stent w/ otw mod non-obs dzs->Med Rx.  Marland Kitchen History of echocardiogram     a. 07/2014 Echo: EF 60-65%, no rwma, nl LA size, nl RV/PASP.   Past Surgical History  Procedure Laterality Date  . Coronary artery bypass graft      stent placement  . Cosmetic surgery  1967  . Back surgery      lumbar disc  . Tonsillectomy    . Back surgery    . Skin graft    . Carpal tunnel release  2009    left  . Cardiac catheterization  6/10;2011;Aug.2012;Oct. 2012;   . Coronary angioplasty  2011 & 2001    s/p stent  . Cardiac catheterization  05/2012    [facility]; EF 60%, patent stent in the left circumflex, moderate three-vessel disease with no flow-limiting lesions. Unchanged from most recent catheterization  . Cyst removal leg Right    Family History  Problem Relation Age of Onset  . Heart disease    . Heart disease Mother   . Heart disease Brother   . Heart disease Brother   . Prostate cancer Father    Social History  Substance Use Topics  . Smoking status: Former Smoker --  15 years    Types: Cigarettes    Quit date: 05/18/1999  . Smokeless tobacco: Never Used     Comment: quit 17 + years  . Alcohol Use: No    Review of Systems  Constitutional: Negative for fever, chills, diaphoresis, activity change and fatigue.  Genitourinary: Positive for dysuria, urgency and frequency. Negative for hematuria, flank pain, decreased urine volume, discharge, scrotal swelling, difficulty urinating, penile pain and testicular pain.  Musculoskeletal: Positive for back pain.  All other systems reviewed and are negative.   Allergies  Fioricet-codeine; Morphine and related; Sulfa antibiotics; Synalgos-dc; and Synchlor a  Home Medications   Prior to Admission medications   Medication Sig Start Date End Date Taking? Authorizing Provider  buPROPion (WELLBUTRIN XL) 300 MG 24 hr tablet Take 300 mg by mouth at bedtime.     Historical Provider, MD  Carbamazepine  (EQUETRO) 300 MG CP12 Take 300 mg by mouth 2 (two) times daily.     Historical Provider, MD  carvedilol (COREG) 25 MG tablet Take 25 mg by mouth 2 (two) times daily with a meal.    Historical Provider, MD  carvedilol (COREG) 25 MG tablet TAKE 1 TABLET BY MOUTH TWICE DAILY 09/08/15   Wellington Hampshire, MD  ciprofloxacin (CIPRO) 500 MG tablet Take 1 tablet (500 mg total) by mouth 2 (two) times daily. 10/02/15   Lorin Picket, PA-C  clopidogrel (PLAVIX) 75 MG tablet Take 75 mg by mouth daily.    Historical Provider, MD  doxepin (SINEQUAN) 25 MG capsule Take 50 mg by mouth at bedtime.     Historical Provider, MD  finasteride (PROSCAR) 5 MG tablet Take 5 mg by mouth daily.    Historical Provider, MD  gabapentin (NEURONTIN) 600 MG tablet Take 600 mg by mouth 3 (three) times daily.    Historical Provider, MD  lisinopril (PRINIVIL,ZESTRIL) 40 MG tablet Take 1 tablet (40 mg total) by mouth daily. 10/25/13   Wellington Hampshire, MD  LORazepam (ATIVAN) 1 MG tablet Take 1-2 mg by mouth 2 (two) times daily. Pt takes one tablet in the morning and two at bedtime.    Historical Provider, MD  magnesium oxide (MAG-OX) 400 (241.3 Mg) MG tablet Take 400 mg by mouth daily.    Historical Provider, MD  metFORMIN (GLUCOPHAGE) 1000 MG tablet Take 1,000 mg by mouth 2 (two) times daily with a meal.    Historical Provider, MD  Multiple Vitamin (MULTIVITAMIN WITH MINERALS) TABS tablet Take 1 tablet by mouth daily.    Historical Provider, MD  nitroGLYCERIN (NITROSTAT) 0.4 MG SL tablet Place 0.4 mg under the tongue every 5 (five) minutes as needed for chest pain.     Historical Provider, MD  paliperidone (INVEGA) 6 MG 24 hr tablet Take 6 mg by mouth at bedtime.     Historical Provider, MD  pantoprazole (PROTONIX) 40 MG tablet Take 40 mg by mouth daily.    Historical Provider, MD  potassium chloride SA (K-DUR,KLOR-CON) 20 MEQ tablet Take 20 mEq by mouth 2 (two) times daily.    Historical Provider, MD  ranolazine (RANEXA) 1000 MG SR  tablet Take 1,000 mg by mouth 2 (two) times daily.     Historical Provider, MD  sitaGLIPtin (JANUVIA) 100 MG tablet Take 100 mg by mouth daily.    Historical Provider, MD  Tamsulosin HCl (FLOMAX) 0.4 MG CAPS Take 0.4 mg by mouth daily after breakfast.     Historical Provider, MD  topiramate (TOPAMAX) 100 MG tablet Take 100 mg  by mouth daily.    Historical Provider, MD  traZODone (DESYREL) 50 MG tablet Take 50 mg by mouth at bedtime as needed for sleep.     Historical Provider, MD   Meds Ordered and Administered this Visit  Medications - No data to display  BP 129/79 mmHg  Pulse 68  Temp(Src) 97 F (36.1 C) (Tympanic)  Resp 16  Ht 6\' 2"  (1.88 m)  Wt 220 lb (99.791 kg)  BMI 28.23 kg/m2  SpO2 100% No data found.   Physical Exam  Constitutional: He is oriented to person, place, and time. He appears well-developed and well-nourished. No distress.  HENT:  Head: Normocephalic and atraumatic.  Eyes: Conjunctivae are normal. Pupils are equal, round, and reactive to light.  Neck: Normal range of motion. Neck supple.  Pulmonary/Chest: Effort normal and breath sounds normal. No respiratory distress. He has no wheezes. He has no rales.  Abdominal: Soft. Bowel sounds are normal. He exhibits no distension and no mass. There is no tenderness. There is no rebound and no guarding.  Musculoskeletal: Normal range of motion.  Neurological: He is alert and oriented to person, place, and time.  Skin: Skin is warm and dry. He is not diaphoretic.  Psychiatric: He has a normal mood and affect. His behavior is normal. Judgment and thought content normal.  Nursing note and vitals reviewed.   ED Course  Procedures (including critical care time)  Labs Review Labs Reviewed  URINALYSIS COMPLETEWITH MICROSCOPIC ([facility] ONLY) - Abnormal; Notable for the following:    APPearance CLOUDY (*)    Bilirubin Urine 1+ (*)    Hgb urine dipstick TRACE (*)    Leukocytes, UA 1+ (*)    Bacteria, UA RARE (*)     Squamous Epithelial / LPF 0-5 (*)    All other components within normal limits  URINE CULTURE    Imaging Review No results found.   Visual Acuity Review  Right Eye Distance:   Left Eye Distance:   Bilateral Distance:    Right Eye Near:   Left Eye Near:    Bilateral Near:         MDM   1. Urinary tract infection, recurrent    New Prescriptions   CIPROFLOXACIN (CIPRO) 500 MG TABLET    Take 1 tablet (500 mg total) by mouth 2 (two) times daily.  Plan: 1. Test/x-ray results and diagnosis reviewed with patient 2. rx as per orders; risks, benefits, potential side effects reviewed with patient 3. Recommend supportive treatment with Increased fluids. He stated he did not require any Pyridium because his pain is not severe. I will obtain a urine culture since his urine does show too numerous to count white cells. Because of the recurrent nature of his urinary tract infections and from previous urine cultures he was resistant to number of the previous medications I will actually start him on Cipro since until the cultures are available is unknown the exact organism. Also because of his recurrent UTIs and the mechanical problems he has trouble getting to them I have recommended close follow-up with his primary care or urologist. I've urged him to arrange an appointment and see them next week. He runs any fevers or is not improving in a day or 2 he should go to the emergency department or may return to this clinic if he is unable to see his primary care physician  4. F/u prn if symptoms worsen or don't improve     Lorin Picket, PA-C 10/02/15 [phone]

## 2015-10-04 LAB — URINE CULTURE
Culture: >=100000 — AB
Special Requests: NORMAL

## 2015-10-09 ENCOUNTER — Telehealth: Payer: Self-pay | Admitting: *Deleted

## 2015-10-10 ENCOUNTER — Telehealth: Payer: Self-pay | Admitting: *Deleted

## 2015-10-10 NOTE — Telephone Encounter (Signed)
Called patient and asked him how he was feeling. Patient reported that he is feeling much better and improving. Directed patient to seek medical attention if symptoms return.

## 2015-10-12 NOTE — Progress Notes (Signed)
Dispensed diabetic shoes and 3 pairs of insoles. Instructions were reviewed and a copy was given to the patient. Patient to reappointment for regularly scheduled diabetic foot care visits or if she experiences any trouble with her diabetic shoes. 

## 2015-10-13 ENCOUNTER — Ambulatory Visit: Payer: Medicare Other | Admitting: Urology

## 2015-10-15 DIAGNOSIS — E538 Deficiency of other specified B group vitamins: Secondary | ICD-10-CM | POA: Diagnosis not present

## 2015-10-15 DIAGNOSIS — N390 Urinary tract infection, site not specified: Secondary | ICD-10-CM | POA: Diagnosis not present

## 2015-10-15 DIAGNOSIS — R42 Dizziness and giddiness: Secondary | ICD-10-CM | POA: Diagnosis not present

## 2015-10-23 ENCOUNTER — Ambulatory Visit (INDEPENDENT_AMBULATORY_CARE_PROVIDER_SITE_OTHER): Payer: Medicare Other | Admitting: *Deleted

## 2015-10-23 ENCOUNTER — Ambulatory Visit
Admission: RE | Admit: 2015-10-23 | Discharge: 2015-10-23 | Disposition: A | Discharge status: Home / Self Care | Payer: Medicare Other | Source: Ambulatory Visit | Attending: Internal Medicine | Admitting: Internal Medicine

## 2015-10-23 DIAGNOSIS — I712 Thoracic aortic aneurysm, without rupture: Secondary | ICD-10-CM | POA: Insufficient documentation

## 2015-10-23 DIAGNOSIS — R0600 Dyspnea, unspecified: Secondary | ICD-10-CM

## 2015-10-23 DIAGNOSIS — R918 Other nonspecific abnormal finding of lung field: Secondary | ICD-10-CM | POA: Insufficient documentation

## 2015-10-23 DIAGNOSIS — I2510 Atherosclerotic heart disease of native coronary artery without angina pectoris: Secondary | ICD-10-CM | POA: Diagnosis not present

## 2015-10-23 LAB — PULMONARY FUNCTION TEST
DL/VA % PRED: 90 %
DL/VA: 4.43 ml/min/mmHg/L
DLCO unc % pred: 75 %
DLCO unc: 29.68 ml/min/mmHg
FEF 25-75 POST: 2.16 L/s
FEF 25-75 Pre: 3.6 L/sec
FEF2575-%Change-Post: -40 %
FEF2575-%Pred-Post: 66 %
FEF2575-%Pred-Pre: 111 %
FEV1-%Change-Post: -20 %
FEV1-%PRED-PRE: 89 %
FEV1-%Pred-Post: 70 %
FEV1-PRE: 3.65 L
FEV1-Post: 2.89 L
FEV1FVC-%Change-Post: -19 %
FEV1FVC-%PRED-PRE: 104 %
FEV6-%Change-Post: -1 %
FEV6-%PRED-POST: 87 %
FEV6-%Pred-Pre: 88 %
FEV6-POST: 4.56 L
FEV6-Pre: 4.64 L
FEV6FVC-%CHANGE-POST: 0 %
FEV6FVC-%PRED-POST: 104 %
FEV6FVC-%Pred-Pre: 105 %
FVC-%CHANGE-POST: -1 %
FVC-%PRED-POST: 83 %
FVC-%Pred-Pre: 84 %
FVC-PRE: 4.65 L
FVC-Post: 4.58 L
POST FEV1/FVC RATIO: 63 %
PRE FEV1/FVC RATIO: 79 %
Post FEV6/FVC ratio: 100 %
Pre FEV6/FVC Ratio: 100 %

## 2015-10-23 NOTE — Progress Notes (Signed)
PFT performed today with nitrogen washout. 

## 2015-10-23 NOTE — Progress Notes (Signed)
SMW performed today. 

## 2015-10-26 ENCOUNTER — Telehealth: Payer: Self-pay | Admitting: Urology

## 2015-10-26 ENCOUNTER — Ambulatory Visit (INDEPENDENT_AMBULATORY_CARE_PROVIDER_SITE_OTHER): Payer: Medicare Other | Admitting: Urology

## 2015-10-26 ENCOUNTER — Encounter: Payer: Self-pay | Admitting: Urology

## 2015-10-26 ENCOUNTER — Telehealth: Payer: Self-pay | Admitting: *Deleted

## 2015-10-26 VITALS — BP 160/98 | HR 69 | Ht 75.0 in | Wt 225.3 lb

## 2015-10-26 DIAGNOSIS — I25119 Atherosclerotic heart disease of native coronary artery with unspecified angina pectoris: Secondary | ICD-10-CM | POA: Diagnosis not present

## 2015-10-26 DIAGNOSIS — N138 Other obstructive and reflux uropathy: Secondary | ICD-10-CM

## 2015-10-26 DIAGNOSIS — Z87448 Personal history of other diseases of urinary system: Secondary | ICD-10-CM | POA: Diagnosis not present

## 2015-10-26 DIAGNOSIS — N401 Enlarged prostate with lower urinary tract symptoms: Secondary | ICD-10-CM | POA: Diagnosis not present

## 2015-10-26 DIAGNOSIS — R3914 Feeling of incomplete bladder emptying: Secondary | ICD-10-CM | POA: Diagnosis not present

## 2015-10-26 DIAGNOSIS — N400 Enlarged prostate without lower urinary tract symptoms: Secondary | ICD-10-CM | POA: Diagnosis not present

## 2015-10-26 NOTE — Progress Notes (Signed)
10/26/2015 11:22 AM   Roger Pruitt 1950/06/25 [id number]  Referring provider: Casilda Carls, MD [address]   [place], [place] [address]  Chief Complaint  Patient presents with  . Hematuria    3 month follow up  . Recurrent UTI    HPI: Patient is a 65 year old Caucasian male with a history of BPH with incomplete bladder emptying and hematuria who presents today for follow-up office visit.  Patient had been on finasteride and tamsulosin for several months without improvement in his bladder emptying and was having recurrent urinary tract infections.  He underwent KTP of the prostate on 03/31/2014 with Dr. Elnoria Howard.  His major complaints today are frequent urination, urgency, nocturia 2 and urge incontinence.    He has had these symptoms for several years.  He denies any hematuria or suprapubic pain.   He currently taking tamsulosin and finasteride.  He had been cathing himself 3 times daily but ran out of caths 3 months ago.  He is still having significant retention of urine. He is not having any discomfort at this time.  He also denies any recent fevers, chills, nausea or vomiting.  He has severe memory issues which complicates his care because he cannot be certain if he taking his medication daily or if he is receiving signals to urinate.   He has a family history of PCa, with his father being diagnosed with prostate cancer.         IPSS    Row Name 10/26/15 1000         International Prostate Symptom Score   How often have you had the sensation of not emptying your bladder? More than half the time     How often have you had to urinate less than every two hours? Not at All     How often have you found you stopped and started again several times when you urinated? Not at All     How often have you found it difficult to postpone urination? More than half the time     How often have you had a weak urinary stream? Less than half the time     How often have you had to strain to start  urination? Less than half the time     How many times did you typically get up at night to urinate? 2 Times     Total IPSS Score 14       Quality of Life due to urinary symptoms   If you were to spend the rest of your life with your urinary condition just the way it is now how would you feel about that? Unhappy         History of hematuria Patient underwent a hematuria work up in 05/2015 and no GU malignancies were found.  He has not seen any gross hematuria.      PMH:  Past Medical History:  Diagnosis Date  . Abdominal pain   . Acute cystitis without hematuria   . Anemia   . Arthritis   . Bipolar disorder ([place])   . BPD (bronchopulmonary dysplasia)   . BPH (benign prostatic hyperplasia)   . CAD (coronary artery disease)    a. s/p MI 2001, 2012;  b. s/p prior LCX stenting; b. 05/2012 Abnl MV; c. 05/2012 Cath: patent LCX stent w/ otw mod non-obs dzs->Med Rx.  . Carpal tunnel syndrome   . Disc degeneration, lumbar   . Elevated PSA   . Esophageal reflux   .  GERD (gastroesophageal reflux disease)   . Hematuria    history of gross and microscopic  . Hematuria   . Hemorrhoid   . History of balanitis   . History of bladder infections   . History of echocardiogram    a. 07/2014 Echo: EF 60-65%, no rwma, nl LA size, nl RV/PASP.  Marland Kitchen History of urethral stricture   . HLD (hyperlipidemia)   . Hyperlipidemia   . Hypertensive heart disease   . Incomplete bladder emptying   . Iron deficiency anemia, unspecified   . Lower urinary tract symptoms   . Neuropathy ([place])   . Nocturia   . Obsessive compulsive disorder   . Other dysphagia   . Panic attack   . Sleep apnea   . Type II diabetes mellitus ([place])   . Unspecified vitamin D deficiency   . Urethral stricture   . Urinary retention   . Varicose vein of leg   . Vitamin D deficiency     Surgical History: Past Surgical History:  Procedure Laterality Date  . BACK SURGERY     lumbar disc  . BACK SURGERY    . CARDIAC  CATHETERIZATION  6/10;2011;Aug.2012;Oct. 2012;   . CARDIAC CATHETERIZATION  05/2012   [facility]; EF 60%, patent stent in the left circumflex, moderate three-vessel disease with no flow-limiting lesions. Unchanged from most recent catheterization  . CARPAL TUNNEL RELEASE  2009   left  . CORONARY ANGIOPLASTY  2011 & 2001   s/p stent  . CORONARY ARTERY BYPASS GRAFT     stent placement  . [place]  . CYST REMOVAL LEG Right   . SKIN GRAFT    . TONSILLECTOMY      Home Medications:    Medication List       Accurate as of 10/26/15 11:22 AM. Always use your most recent med list.          ACCU-CHEK AVIVA PLUS test strip Generic drug:  glucose blood TEST ONCE D   aspirin 81 MG chewable tablet Chew by mouth.   B-D SINGLE USE SWABS REGULAR Pads   buPROPion 300 MG 24 hr tablet Commonly known as:  WELLBUTRIN XL Take 300 mg by mouth at bedtime.   Carbamazepine 300 MG Cp12 Commonly known as:  Equetro Take 300 mg by mouth 2 (two) times daily.   carvedilol 25 MG tablet Commonly known as:  COREG Take 25 mg by mouth 2 (two) times daily with a meal.   carvedilol 25 MG tablet Commonly known as:  COREG TAKE 1 TABLET BY MOUTH TWICE DAILY   chlorthalidone 25 MG tablet Commonly known as:  HYGROTON   ciprofloxacin 500 MG tablet Commonly known as:  CIPRO Take 1 tablet (500 mg total) by mouth 2 (two) times daily.   clonazePAM 1 MG tablet Commonly known as:  KLONOPIN   clopidogrel 75 MG tablet Commonly known as:  PLAVIX Take 75 mg by mouth daily.   doxepin 25 MG capsule Commonly known as:  SINEQUAN Take 50 mg by mouth at bedtime.   finasteride 5 MG tablet Commonly known as:  PROSCAR Take 5 mg by mouth daily.   gabapentin 600 MG tablet Commonly known as:  NEURONTIN Take 600 mg by mouth 3 (three) times daily.   JANUMET 50-1000 MG tablet Generic drug:  sitaGLIPtin-metformin   lisinopril 40 MG tablet Commonly known as:  PRINIVIL,ZESTRIL Take 1 tablet (40 mg total)  by mouth daily.   LORazepam 1 MG tablet Commonly known as:  ATIVAN Take 1-2 mg by mouth 2 (two) times daily. Pt takes one tablet in the morning and two at bedtime.   magnesium oxide 400 (241.3 Mg) MG tablet Commonly known as:  MAG-OX Take 400 mg by mouth daily.   metFORMIN 1000 MG tablet Commonly known as:  GLUCOPHAGE Take 1,000 mg by mouth 2 (two) times daily with a meal.   multivitamin with minerals Tabs tablet Take 1 tablet by mouth daily.   nitroGLYCERIN 0.4 MG SL tablet Commonly known as:  NITROSTAT Place 0.4 mg under the tongue every 5 (five) minutes as needed for chest pain.   paliperidone 156 MG/ML Susp injection Commonly known as:  INVEGA SUSTENNA   paliperidone 6 MG 24 hr tablet Commonly known as:  INVEGA Take 6 mg by mouth at bedtime.   pantoprazole 40 MG tablet Commonly known as:  PROTONIX Take 40 mg by mouth daily.   potassium chloride 10 MEQ tablet Commonly known as:  K-DUR TK 1 T PO D   potassium chloride SA 20 MEQ tablet Commonly known as:  K-DUR,KLOR-CON Take 20 mEq by mouth 2 (two) times daily.   RANEXA 1000 MG SR tablet Generic drug:  ranolazine Take 1,000 mg by mouth 2 (two) times daily.   sitaGLIPtin 100 MG tablet Commonly known as:  JANUVIA Take 100 mg by mouth daily.   tamsulosin 0.4 MG Caps capsule Commonly known as:  FLOMAX Take 0.4 mg by mouth daily after breakfast.   topiramate 100 MG tablet Commonly known as:  TOPAMAX Take 100 mg by mouth daily.   traMADol 50 MG tablet Commonly known as:  ULTRAM Take by mouth.   traZODone 50 MG tablet Commonly known as:  DESYREL Take 50 mg by mouth at bedtime as needed for sleep.       Allergies:  Allergies  Allergen Reactions  . Fioricet-Codeine [Butalbital-Apap-Caff-Cod] Nausea And Vomiting  . Morphine And Related Nausea And Vomiting  . Sulfa Antibiotics Nausea And Vomiting  . Synalgos-Dc [Aspirin-Caff-Dihydrocodeine] Itching and Other (See Comments)    Reaction:  Stinging   .  Synchlor A [Chlorpheniramine] Rash    Family History: Family History  Problem Relation Age of Onset  . Heart disease Mother   . Heart disease Brother   . Prostate cancer Father   . Heart disease    . Heart disease Brother   . Kidney disease Neg Hx     Social History:  reports that he quit smoking about 16 years ago. His smoking use included Cigarettes. He quit after 15.00 years of use. He has never used smokeless tobacco. He reports that he does not drink alcohol or use drugs.  ROS: UROLOGY Frequent Urination?: Yes Hard to postpone urination?: Yes Burning/pain with urination?: No Get up at night to urinate?: Yes Leakage of urine?: Yes Urine stream starts and stops?: No Trouble starting stream?: No Do you have to strain to urinate?: No Blood in urine?: Yes Urinary tract infection?: Yes Sexually transmitted disease?: No Injury to kidneys or bladder?: No Painful intercourse?: No Weak stream?: No Erection problems?: No Penile pain?: No  Gastrointestinal Nausea?: No Vomiting?: No Indigestion/heartburn?: No Diarrhea?: No Constipation?: No  Constitutional Fever: No Night sweats?: No Weight loss?: No Fatigue?: No  Skin Skin rash/lesions?: No Itching?: No  Eyes Blurred vision?: No Double vision?: No  Ears/Nose/Throat Sore throat?: No Sinus problems?: No  Hematologic/Lymphatic Swollen glands?: No Easy bruising?: No  Cardiovascular Leg swelling?: No Chest pain?: No  Respiratory Cough?: Yes Shortness of breath?: Yes  Endocrine Excessive thirst?: No  Musculoskeletal Back pain?: Yes Joint pain?: No  Neurological Headaches?: No Dizziness?: No  Psychologic Depression?: Yes Anxiety?: No  Physical Exam: BP (!) 160/98   Pulse 69   Ht 6\' 3"  (1.905 m)   Wt 225 lb 4.8 oz (102.2 kg)   BMI 28.16 kg/m   Constitutional: Well nourished. Alert and oriented, No acute distress. HEENT:  AT, moist mucus membranes. Trachea midline, no  masses. Cardiovascular: No clubbing, cyanosis, or edema. Respiratory: Normal respiratory effort, no increased work of breathing. GI: Abdomen is soft, non tender, non distended, no abdominal masses. Liver and spleen not palpable.  No hernias appreciated.  Stool sample for occult testing is not indicated.   GU: No CVA tenderness.  No bladder fullness or masses.  Patient with uncircumcised phallus. Foreskin easily retracted  Urethral meatus is patent.  No penile discharge. No penile lesions or rashes. Scrotum without lesions, cysts, rashes and/or edema.  Testicles are located scrotally bilaterally. No masses are appreciated in the testicles. Left and right epididymis are normal. Rectal: Patient with  normal sphincter tone. Anus and perineum without scarring or rashes. No rectal masses are appreciated. Prostate is approximately 45 grams, no nodules are appreciated. Seminal vesicles are normal. Skin: No rashes, bruises or suspicious lesions. Lymph: No cervical or inguinal adenopathy. Neurologic: Grossly intact, no focal deficits, moving all 4 extremities. Psychiatric: Normal mood and affect.   Laboratory Data: Lab Results  Component Value Date   WBC 5.4 07/30/2015   HGB 11.7 (L) 07/30/2015   HCT 33.0 (L) 07/30/2015   MCV 89.5 07/30/2015   PLT 191 07/30/2015    Lab Results  Component Value Date   CREATININE 0.89 07/30/2015   Lab Results  Component Value Date   HGBA1C 5.3 07/28/2015   Lab Results      PSA      0.4 ng/mL  on 11/21/2014  Results for orders placed or performed in visit on 10/23/15  Pulmonary function test  Result Value Ref Range   FVC-Pre 4.65 L   FVC-%Pred-Pre 84 %   FVC-Post 4.58 L   FVC-%Pred-Post 83 %   FVC-%Change-Post -1 %   FEV1-Pre 3.65 L   FEV1-%Pred-Pre 89 %   FEV1-Post 2.89 L   FEV1-%Pred-Post 70 %   FEV1-%Change-Post -20 %   FEV6-Pre 4.64 L   FEV6-%Pred-Pre 88 %   FEV6-Post 4.56 L   FEV6-%Pred-Post 87 %   FEV6-%Change-Post -1 %   Pre FEV1/FVC  ratio 79 %   FEV1FVC-%Pred-Pre 104 %   Post FEV1/FVC ratio 63 %   FEV1FVC-%Change-Post -19 %   Pre FEV6/FVC Ratio 100 %   FEV6FVC-%Pred-Pre 105 %   Post FEV6/FVC ratio 100 %   FEV6FVC-%Pred-Post 104 %   FEV6FVC-%Change-Post 0 %   FEF 25-75 Pre 3.60 L/sec   FEF2575-%Pred-Pre 111 %   FEF 25-75 Post 2.16 L/sec   FEF2575-%Pred-Post 66 %   FEF2575-%Change-Post -40 %   DLCO unc 29.68 ml/min/mmHg   DLCO unc % pred 75 %   DL/VA 4.43 ml/min/mmHg/L   DL/VA % pred 90 %    Assessment & Plan:    1. BPH with incomplete bladder emptying:   Patient underwent KTP of the prostate on 03/31/2014. He still continues to take the tamsulosin and finasteride.  He was cathing himself three times daily, but he has not received catheter supplies in the last three months.  Patient will need to catheterize three times daily indefinitely due to urinary retention.  We will  contact a catheter supply company to reinstate his deliveries.    2. History of hematuria:   Patient completed hematuria work up in 05/2015.  No GU malignancies were discovered.  We will continue to monitor UA's.  He will report any gross hematuria.  He will RTC in one year for UA.  3. BPH with LUTS  - IPSS score is 14/5, it is stable/improving/worsening  - Continue tamsulosin 0.4 mg daily and finasteride 5 mg daily  - RTC in 12 months for IPSS, PSA and exam   Return in about 1 year (around 10/25/2016) for IPSS, PSA, UA and exam.  Zara Council, [facility]  [place] [address] [place], [place] [address] [phone]

## 2015-10-26 NOTE — Telephone Encounter (Signed)
Done

## 2015-10-26 NOTE — Telephone Encounter (Signed)
Please call [phone] and order caths for this patient.

## 2015-10-26 NOTE — Telephone Encounter (Signed)
Responding to patient wife call back after patient office visit today. LMOM to call office back.

## 2015-10-27 ENCOUNTER — Ambulatory Visit (INDEPENDENT_AMBULATORY_CARE_PROVIDER_SITE_OTHER): Payer: Medicare HMO | Admitting: Internal Medicine

## 2015-10-27 ENCOUNTER — Encounter: Payer: Self-pay | Admitting: Internal Medicine

## 2015-10-27 ENCOUNTER — Telehealth: Payer: Self-pay | Admitting: *Deleted

## 2015-10-27 ENCOUNTER — Telehealth: Payer: Self-pay | Admitting: Urology

## 2015-10-27 VITALS — BP 130/82 | HR 84 | Ht 74.0 in | Wt 225.6 lb

## 2015-10-27 DIAGNOSIS — G4730 Sleep apnea, unspecified: Secondary | ICD-10-CM | POA: Diagnosis not present

## 2015-10-27 DIAGNOSIS — R0600 Dyspnea, unspecified: Secondary | ICD-10-CM

## 2015-10-27 DIAGNOSIS — R918 Other nonspecific abnormal finding of lung field: Secondary | ICD-10-CM | POA: Diagnosis not present

## 2015-10-27 DIAGNOSIS — R0789 Other chest pain: Secondary | ICD-10-CM

## 2015-10-27 LAB — PSA: Prostate Specific Ag, Serum: 0.6 ng/mL (ref 0.0–4.0)

## 2015-10-27 NOTE — Telephone Encounter (Signed)
I spoke with Mrs. Chimento this afternoon. She is upset that her husband did not give [place] a note during his exam yesterday.  I explained that I could not be responsible if the patient themselves did not have me at note.  She stated that Dr. Rosario Jacks told her that he had a urinary tract infection.  I asked the wife with symptoms the patient was having and she stated that she saw blood in his urine.  Patient during his appointment yesterday stated that he had not been cathetering himself for the last 3 months because he told the catheter company he did not need any further catheters.  We reinstated the catheter prescription and patient will begin cathing again.  I explained to the patient that the blood in the urine may come from the fact that he was not emptying the bladder completely.  He has undergone a hematuria workup.  She also stated that she was very upset that I told her husband he did not need to return for a 1 year.   I stated that his exam was normal and his laboratory results, specifically, his PSA were normal.  He is comfortable with [facility], so there was no medical indication for the patient to return sooner than a year.   I reiterated to her that it was important for the patient to have catheterized specimens to rule out infection.  I would like him to present to the office for a catheterized specimen due to his history of recurrent urinary tract infections.  She then hung up the phone.

## 2015-10-27 NOTE — Telephone Encounter (Signed)
Spoke with patient's wife and she states Roger Pruitt had a piece of paper he was supposed to show [place] yesterday about a possible UTI that could be happening because his urine was checked at Dr. Fredirick Maudlin office and he knew patient had appointment here and told him to follow up with [place]. I let her know that patient did not bring this up at all or did not complain of any symptoms. I offered to put him on the nurse schedule to check a urine to see. Patient wife of with plan. Also gave PSA results as stable and follow up in one year. Ok with plan

## 2015-10-27 NOTE — Progress Notes (Signed)
[facility] Consultation      MRN# [id number] Roger Pruitt Aug 27, 1950   CC: Chief Complaint  Patient presents with  . Follow-up    42mo rov. CT/PFT/SMW results. breathing has worsen. c/o increased sob, non prod cough & chest pressure w/sob.      Brief History: 65 yo male former smoker seen for Multiple pulmonary nodules.    Events since last clinic visit: Presents today for follow-up visit of his pulmonary nodules, multiple and diffuse along with chronic cough. He states that since his last visit his cough is minimal at the same, it's dry, more of a tickle in the throat clearing of the throat. He noticed the cough is usually worse with eating and shortly after meals. Patient states that he's been having some chest discomfort which he describes as chest pressure over the past one month mostly with exertion, no lightheadedness, no diaphoresis, no right arm pain or left arm pain associated with this chest discomfort. He does follow with cardiology for chronic chest pain and has had several catheterizations in the past for it.     Review of Systems  Constitutional: Negative for chills and fever.  Eyes: Negative for blurred vision.  Respiratory: Positive for cough and shortness of breath. Negative for hemoptysis, sputum production and wheezing.   Cardiovascular: Positive for chest pain.  Neurological: Negative for dizziness and headaches.  Endo/Heme/Allergies: Does not bruise/bleed easily.  Psychiatric/Behavioral: Negative for depression.      Allergies:  Fioricet-codeine [butalbital-apap-caff-cod]; Morphine and related; Sulfa antibiotics; Synalgos-dc [aspirin-caff-dihydrocodeine]; and Synchlor a [chlorpheniramine]  Physical Examination:  VS: BP 130/82 (BP Location: Left Arm, Cuff Size: Normal)   Pulse 84   Ht 6\' 2"  (1.88 m)   Wt 225 lb 9.6 oz (102.3 kg)   SpO2 98%   BMI 28.97 kg/m   General Appearance: No distress  HEENT: PERRLA, no ptosis, no other  lesions noticed Pulmonary:normal breath sounds., diaphragmatic excursion normal.No wheezing, No rales   Cardiovascular:  Normal S1,S2.  No m/r/g.     Abdomen:Exam: Benign, Soft, non-tender, No masses  Skin:   warm, no rashes, no ecchymosis  Extremities: normal, no cyanosis, clubbing, warm with normal capillary refill.      Rad results:(The following images and results were reviewed by Dr. Stevenson Clinch on 10/27/2015). CT Chest 10/23/15 CLINICAL DATA:  Cough for 2 months.  Pulmonary nodules. EXAM: CT CHEST WITHOUT CONTRAST TECHNIQUE: Multidetector CT imaging of the chest was performed following the standard protocol without IV contrast. COMPARISON:  Abdominal CT 06/05/2015 and abdominal CT 11/27/2013 FINDINGS: Cardiovascular: Diffuse coronary artery calcifications. Dilatation of the ascending thoracic aorta measuring up to 4.0 cm. Cannot evaluate for aortic dissection on this non contrast examination. Atherosclerotic calcifications in the thoracic aorta. Mediastinum/Nodes: Normal appearance of the esophagus. No suspicious mediastinal or hilar lymphadenopathy. No suspicious axillary lymphadenopathy. No significant pericardial fluid.  Lungs/Pleura: Trachea and mainstem bronchi are patent. The sub solid opacity at the right lung base has resolved. Punctate pleural-based nodularity or scarring at the right lung base on sequence 3, image 141 was probably present on the CT abdomen from 11/27/2013. 4 mm pleural-based nodule in the right lower lobe on sequence 3, image 69. Patchy ground-glass and parenchymal densities in the posterior right upper lobe and the superior segment of the right lower lobe. Additional punctate nodular densities in the right upper lobe. Pleural-based calcification at the left lung apex. Peripheral 3 mm nodule in the left upper lobe on sequence 3, image 82. Punctate nodule at  the left lung base on image 120. The patchy parenchymal disease along the medial left lower lobe  has resolved from the previous examination. 3 mm nodule left lower lobe on image 92. 7 x 6 mm nodule in the superior segment of the left lower lobe measured on sequence 3, image 65. Upper Abdomen: Stable calcification at the right hepatic dome. No acute abnormality in the upper abdomen. Musculoskeletal: No acute abnormality.  IMPRESSION: The mixed solid and ground-glass densities in the right lower lobe and the subtle parenchymal densities in left lower lobe have resolved from the previous examination. These findings were likely inflammatory or infectious etiology. However, there are patchy ground-glass densities throughout the right upper lobe and within the superior segment of the right lower lobe. These ground-glass densities most likely represent related to an infectious or inflammatory etiology and may explain the patient's persistent coughing. Multiple small pulmonary nodules scattered throughout both lungs which are indeterminate. The largest pulmonary nodule measures up to 7 mm in the left lower lobe. Non-contrast chest CT at 3-6 months is recommended. If the nodules are stable at time of repeat CT, then future CT at 18-24 months (from today's scan) is considered optional for low-risk patients, but is recommended for high-risk patients. This recommendation follows the consensus statement: Guidelines for Management of Incidental Pulmonary Nodules Detected on CT Images:From the Fleischner Society 2017; published online before print (10.1148/radiol.IJ:2314499). Coronary artery calcifications. Fusiform aneurysm of the ascending thoracic aorta, measuring up to 4.0 cm. Recommend annual imaging followup by CTA or MRA.      Assessment and Plan: Apnea, sleep S/P UPPP  Pulmonary nodules Repeat CT chest with mix solid and groundglass densities in the right lower lobe and left lower lobe previously resolved most likely inflammatory and infectious etiology. There are some patchy  groundglass densities throughout the right upper lobe and within the superior segment of the right lower lobe that will need further surveillance CAT scan.  Plan: -Resolution of some pulmonary nodules however with continued groundglass opacities in the right lower lobe; follow-up CT chest without contrast for surveillance in 3 months  Dyspnea Stable dyspnea on exertion. I suspect deconditioning is may be adding to the level of dyspnea is having. Primary function tests and 6 minute walk test within acceptable limits. Pulmonary function test showed FEV1 of 89% FEV1/FVC 79%, flow loops with mild flattening at end expiratory showing may be some early signs of obstruction. He did not have any sniff response to bronchodilators. At this time no significant obstructive or restrictive disease noted on his primary function tests. No need for inhalers at this time.  Plan: -Exercise as tolerated, 30 minutes of walking at a moderate pace at least 3 times per week as tolerated  Chest tightness or pressure Show a chronic chest pain and chest discomfort for a number of years, has had multiple cardiac catheterizations and followed closely by cardiology. He has stable angina and has been operatively managed for this.  Plan: -His angina has persisted over the past one month, I recommended follow-up with cardiology in the next 4-6 weeks or sooner if angina continues to persistent and he has worsening chest pain and diaphoresis   Updated Medication List Outpatient Encounter Prescriptions as of 10/27/2015  Medication Sig  . ACCU-CHEK AVIVA PLUS test strip TEST ONCE D  . Alcohol Swabs (B-D SINGLE USE SWABS REGULAR) PADS   . buPROPion (WELLBUTRIN XL) 300 MG 24 hr tablet Take 300 mg by mouth at bedtime.   . Carbamazepine (EQUETRO)  300 MG CP12 Take 300 mg by mouth 2 (two) times daily.   . carvedilol (COREG) 25 MG tablet TAKE 1 TABLET BY MOUTH TWICE DAILY  . chlorthalidone (HYGROTON) 25 MG tablet   . clonazePAM  (KLONOPIN) 1 MG tablet   . clopidogrel (PLAVIX) 75 MG tablet Take 75 mg by mouth daily.  Marland Kitchen doxepin (SINEQUAN) 25 MG capsule Take 50 mg by mouth at bedtime.   . finasteride (PROSCAR) 5 MG tablet Take 5 mg by mouth daily.  Marland Kitchen gabapentin (NEURONTIN) 600 MG tablet Take 600 mg by mouth 3 (three) times daily.  Marland Kitchen JANUMET 50-1000 MG tablet   . lisinopril (PRINIVIL,ZESTRIL) 40 MG tablet Take 1 tablet (40 mg total) by mouth daily.  Marland Kitchen LORazepam (ATIVAN) 1 MG tablet Take 1-2 mg by mouth 2 (two) times daily. Pt takes one tablet in the morning and two at bedtime.  . magnesium oxide (MAG-OX) 400 (241.3 Mg) MG tablet Take 400 mg by mouth daily.  . metFORMIN (GLUCOPHAGE) 1000 MG tablet Take 1,000 mg by mouth 2 (two) times daily with a meal.  . Multiple Vitamin (MULTIVITAMIN WITH MINERALS) TABS tablet Take 1 tablet by mouth daily.  . nitroGLYCERIN (NITROSTAT) 0.4 MG SL tablet Place 0.4 mg under the tongue every 5 (five) minutes as needed for chest pain.   . paliperidone (INVEGA SUSTENNA) 156 MG/ML SUSP injection   . paliperidone (INVEGA) 6 MG 24 hr tablet Take 6 mg by mouth at bedtime.   . pantoprazole (PROTONIX) 40 MG tablet Take 40 mg by mouth daily.  . potassium chloride SA (K-DUR,KLOR-CON) 20 MEQ tablet Take 20 mEq by mouth 2 (two) times daily.  . ranolazine (RANEXA) 1000 MG SR tablet Take 1,000 mg by mouth 2 (two) times daily.   . sitaGLIPtin (JANUVIA) 100 MG tablet Take 100 mg by mouth daily.  . Tamsulosin HCl (FLOMAX) 0.4 MG CAPS Take 0.4 mg by mouth daily after breakfast.   . topiramate (TOPAMAX) 100 MG tablet Take 100 mg by mouth daily.  . traZODone (DESYREL) 50 MG tablet Take 50 mg by mouth at bedtime as needed for sleep.   . [DISCONTINUED] aspirin 81 MG chewable tablet Chew by mouth.  . [DISCONTINUED] traMADol (ULTRAM) 50 MG tablet Take by mouth.  . [DISCONTINUED] carvedilol (COREG) 25 MG tablet Take 25 mg by mouth 2 (two) times daily with a meal.  . [DISCONTINUED] ciprofloxacin (CIPRO) 500 MG tablet  Take 1 tablet (500 mg total) by mouth 2 (two) times daily. (Patient not taking: Reported on 10/27/2015)  . [DISCONTINUED] potassium chloride (K-DUR) 10 MEQ tablet TK 1 T PO D   No facility-administered encounter medications on file as of 10/27/2015.     Orders for this visit: Orders Placed This Encounter  Procedures  . CT CHEST WO CONTRAST    Standing Status:   Future    Standing Expiration Date:   12/26/2016    Order Specific Question:   Reason for Exam (SYMPTOM  OR DIAGNOSIS REQUIRED)    Answer:   lung nodules    Order Specific Question:   Preferred imaging location?    Answer:   [facility]    Thank  you for the visitation and for allowing  [facility] to assist in the care of your patient. Our recommendations are noted above.  Please contact us if we can be of further service.  Vilinda Boehringer, MD [facility] Office Number: [phone]  Note: This note was prepared with  Dragon dictation along with smaller Company secretary. Any transcriptional errors that result from this process are unintentional.

## 2015-10-27 NOTE — Patient Instructions (Addendum)
Follow up with Dr. Stevenson Clinch in:3 months - CT Chest w/o contrast - pulmonary nodules - follow up with Cardiology in the next 4-6 weeks for chest pain since this is a chronic issue

## 2015-10-27 NOTE — Telephone Encounter (Signed)
Spoke with patient wife and gave results will follow up in one year. Ok with plan.

## 2015-10-27 NOTE — Assessment & Plan Note (Signed)
Repeat CT chest with mix solid and groundglass densities in the right lower lobe and left lower lobe previously resolved most likely inflammatory and infectious etiology. There are some patchy groundglass densities throughout the right upper lobe and within the superior segment of the right lower lobe that will need further surveillance CAT scan.  Plan: -Resolution of some pulmonary nodules however with continued groundglass opacities in the right lower lobe; follow-up CT chest without contrast for surveillance in 3 months

## 2015-10-27 NOTE — Assessment & Plan Note (Signed)
Stable dyspnea on exertion. I suspect deconditioning is may be adding to the level of dyspnea is having. Primary function tests and 6 minute walk test within acceptable limits. Pulmonary function test showed FEV1 of 89% FEV1/FVC 79%, flow loops with mild flattening at end expiratory showing may be some early signs of obstruction. He did not have any sniff response to bronchodilators. At this time no significant obstructive or restrictive disease noted on his primary function tests. No need for inhalers at this time.  Plan: -Exercise as tolerated, 30 minutes of walking at a moderate pace at least 3 times per week as tolerated

## 2015-10-27 NOTE — Telephone Encounter (Signed)
-----   Message from Nori Riis, PA-C sent at 10/27/2015  7:52 AM EDT ----- Please notify the patient that his PSA is stable. We will see him in one year.

## 2015-10-27 NOTE — Assessment & Plan Note (Signed)
S/P UPPP

## 2015-10-27 NOTE — Assessment & Plan Note (Signed)
Show a chronic chest pain and chest discomfort for a number of years, has had multiple cardiac catheterizations and followed closely by cardiology. He has stable angina and has been operatively managed for this.  Plan: -His angina has persisted over the past one month, I recommended follow-up with cardiology in the next 4-6 weeks or sooner if angina continues to persistent and he has worsening chest pain and diaphoresis

## 2015-10-28 ENCOUNTER — Ambulatory Visit (INDEPENDENT_AMBULATORY_CARE_PROVIDER_SITE_OTHER): Payer: Medicare HMO | Admitting: Family Medicine

## 2015-10-28 DIAGNOSIS — Z87448 Personal history of other diseases of urinary system: Secondary | ICD-10-CM | POA: Diagnosis not present

## 2015-10-28 LAB — MICROSCOPIC EXAMINATION
BACTERIA UA: NONE SEEN
WBC, UA: NONE SEEN /hpf (ref 0–?)

## 2015-10-28 LAB — URINALYSIS, COMPLETE
Bilirubin, UA: NEGATIVE
Glucose, UA: NEGATIVE
Ketones, UA: NEGATIVE
LEUKOCYTES UA: NEGATIVE
Nitrite, UA: NEGATIVE
PH UA: 8.5 — AB (ref 5.0–7.5)
PROTEIN UA: NEGATIVE
SPEC GRAV UA: 1.015 (ref 1.005–1.030)
Urobilinogen, Ur: 0.2 mg/dL (ref 0.2–1.0)

## 2015-10-28 NOTE — Progress Notes (Signed)
In and Out Catheterization  Patient is present today for a I & O catheterization due to hematuria. Patient was cleaned and prepped in a sterile fashion with betadine and Lidocaine 2% jelly was instilled into the urethra.  A 14FR cath was inserted no complications were noted , 229ml of urine return was noted, urine was amber in color. A clean urine sample was collected for urinalysis. Bladder was drained  And catheter was removed with out difficulty.    Preformed by: Elberta Leatherwood, CMA

## 2015-10-28 NOTE — Telephone Encounter (Signed)
-----   Message from Nori Riis, PA-C sent at 10/28/2015  2:03 PM EDT ----- Please let the patient know that his urine did not demonstrate infection.  It was nitrate negative, no bacteria and no pus.  There was blood, but this is most likely due to his difficulty with his cathing.  He was worked up for his blood in the urine in March and no cancer was found in the GU system.  If he continues to have difficulty with cathing or it becomes more and more difficult, he will need a cystoscopy to evaluate for stricture.

## 2015-10-28 NOTE — Telephone Encounter (Signed)
Patient notified and will let us know if the cathing gets more difficult for him

## 2015-10-29 ENCOUNTER — Ambulatory Visit: Payer: Medicare Other

## 2015-11-02 DIAGNOSIS — F3132 Bipolar disorder, current episode depressed, moderate: Secondary | ICD-10-CM | POA: Diagnosis not present

## 2015-11-03 ENCOUNTER — Ambulatory Visit: Payer: Medicare Other | Admitting: Podiatry

## 2015-11-12 DIAGNOSIS — F250 Schizoaffective disorder, bipolar type: Secondary | ICD-10-CM | POA: Diagnosis not present

## 2015-11-14 ENCOUNTER — Emergency Department
Admission: EM | Admit: 2015-11-14 | Discharge: 2015-11-14 | Disposition: A | Discharge status: Home / Self Care | Payer: Medicare HMO | Attending: Emergency Medicine | Admitting: Emergency Medicine

## 2015-11-14 DIAGNOSIS — Z87891 Personal history of nicotine dependence: Secondary | ICD-10-CM | POA: Diagnosis not present

## 2015-11-14 DIAGNOSIS — Y999 Unspecified external cause status: Secondary | ICD-10-CM | POA: Insufficient documentation

## 2015-11-14 DIAGNOSIS — I2510 Atherosclerotic heart disease of native coronary artery without angina pectoris: Secondary | ICD-10-CM | POA: Insufficient documentation

## 2015-11-14 DIAGNOSIS — T162XXA Foreign body in left ear, initial encounter: Secondary | ICD-10-CM | POA: Diagnosis not present

## 2015-11-14 DIAGNOSIS — I119 Hypertensive heart disease without heart failure: Secondary | ICD-10-CM | POA: Insufficient documentation

## 2015-11-14 DIAGNOSIS — Z7984 Long term (current) use of oral hypoglycemic drugs: Secondary | ICD-10-CM | POA: Insufficient documentation

## 2015-11-14 DIAGNOSIS — Y929 Unspecified place or not applicable: Secondary | ICD-10-CM | POA: Diagnosis not present

## 2015-11-14 DIAGNOSIS — Z791 Long term (current) use of non-steroidal anti-inflammatories (NSAID): Secondary | ICD-10-CM | POA: Insufficient documentation

## 2015-11-14 DIAGNOSIS — E119 Type 2 diabetes mellitus without complications: Secondary | ICD-10-CM | POA: Insufficient documentation

## 2015-11-14 DIAGNOSIS — Y939 Activity, unspecified: Secondary | ICD-10-CM | POA: Insufficient documentation

## 2015-11-14 DIAGNOSIS — X58XXXA Exposure to other specified factors, initial encounter: Secondary | ICD-10-CM | POA: Insufficient documentation

## 2015-11-14 DIAGNOSIS — Z79899 Other long term (current) drug therapy: Secondary | ICD-10-CM | POA: Insufficient documentation

## 2015-11-14 DIAGNOSIS — Z955 Presence of coronary angioplasty implant and graft: Secondary | ICD-10-CM | POA: Diagnosis not present

## 2015-11-14 DIAGNOSIS — I252 Old myocardial infarction: Secondary | ICD-10-CM | POA: Diagnosis not present

## 2015-11-14 MED ORDER — LIDOCAINE HCL (PF) 1 % IJ SOLN
INTRAMUSCULAR | Status: AC
  Filled 2015-11-14: qty 5

## 2015-11-14 NOTE — ED Triage Notes (Signed)
Pt ambulatory to triage with no difficulty. Pt reports he has a bug in his left ear.

## 2015-11-14 NOTE — ED Notes (Signed)
1% lidocaine placed in left ear per PA request.

## 2015-11-14 NOTE — ED Provider Notes (Signed)
[facility] Emergency Department Provider Note   ____________________________________________   None    (approximate)  I have reviewed the triage vital signs and the nursing notes.   HISTORY  Chief Complaint Foreign Body in Ear    HPI Roger Pruitt is a 65 y.o. male patient complaining of bug in left ear. Patient state he felt foreign body area approximate hour ago. No palliative measures for this complaint. Patient rates his pain as a 6/10.   Past Medical History:  Diagnosis Date  . Abdominal pain   . Acute cystitis without hematuria   . Anemia   . Arthritis   . Bipolar disorder ([place])   . BPD (bronchopulmonary dysplasia)   . BPH (benign prostatic hyperplasia)   . CAD (coronary artery disease)    a. s/p MI 2001, 2012;  b. s/p prior LCX stenting; b. 05/2012 Abnl MV; c. 05/2012 Cath: patent LCX stent w/ otw mod non-obs dzs->Med Rx.  . Carpal tunnel syndrome   . Disc degeneration, lumbar   . Elevated PSA   . Esophageal reflux   . GERD (gastroesophageal reflux disease)   . Hematuria    history of gross and microscopic  . Hematuria   . Hemorrhoid   . History of balanitis   . History of bladder infections   . History of echocardiogram    a. 07/2014 Echo: EF 60-65%, no rwma, nl LA size, nl RV/PASP.  Marland Kitchen History of urethral stricture   . HLD (hyperlipidemia)   . Hyperlipidemia   . Hypertensive heart disease   . Incomplete bladder emptying   . Iron deficiency anemia, unspecified   . Lower urinary tract symptoms   . Neuropathy ([place])   . Nocturia   . Obsessive compulsive disorder   . Other dysphagia   . Panic attack   . Sleep apnea   . Type II diabetes mellitus ([place])   . Unspecified vitamin D deficiency   . Urethral stricture   . Urinary retention   . Varicose vein of leg   . Vitamin D deficiency     Patient Active Problem List   Diagnosis Date Noted  . Chest tightness or pressure 10/27/2015  . Hypertensive heart disease   . HLD  (hyperlipidemia)   . Type II diabetes mellitus ([place])   . UTI (lower urinary tract infection) 07/20/2015  . Hyponatremia 07/20/2015  . Abnormal chest CT 07/10/2015  . Pulmonary nodules 07/09/2015  . Dyspnea 07/09/2015  . Basal cell carcinoma 05/27/2015  . Bipolar affective disorder ([place]) 05/27/2015  . CAD in native artery 05/27/2015  . Degeneration of intervertebral disc of lumbosacral region 05/27/2015  . GERD (gastroesophageal reflux disease) 05/27/2015  . Benign essential HTN 05/27/2015  . Apnea, sleep 05/27/2015  . 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 Benign prostatic hypertrophy (BPH) with incomplete bladder emptying 11/29/2014  . Acute encephalopathy 08/23/2014  . CVA (cerebral infarction) 08/23/2014  . Hypokalemia 08/23/2014  . Leukocytosis 08/23/2014  . Acute CVA (cerebrovascular accident) ([place]) 08/23/2014  . Hypertensive urgency 01/01/2013  . Hypertension   . Chest pain 09/30/2011  . CAD (coronary artery disease) 09/30/2011  . S/P coronary artery stent placement 09/30/2011  . Hyperlipidemia 09/30/2011  . Diabetes mellitus ([place]) 09/30/2011  . H/O acute myocardial infarction 09/03/2011  . Allergic rhinitis, seasonal 09/03/2011    Past Surgical History:  Procedure Laterality Date  . BACK SURGERY     lumbar disc  . BACK SURGERY    . CARDIAC CATHETERIZATION  6/10;2011;Aug.2012;Oct. 2012;   Marland Kitchen  CARDIAC CATHETERIZATION  05/2012   [facility]; EF 60%, patent stent in the left circumflex, moderate three-vessel disease with no flow-limiting lesions. Unchanged from most recent catheterization  . CARPAL TUNNEL RELEASE  2009   left  . CORONARY ANGIOPLASTY  2011 & 2001   s/p stent  . CORONARY ARTERY BYPASS GRAFT     stent placement  . [place]  . CYST REMOVAL LEG Right   . SKIN GRAFT    . TONSILLECTOMY      Prior to Admission medications   Medication Sig Start Date End Date Taking? Authorizing Provider  ACCU-CHEK AVIVA PLUS test strip TEST ONCE D 10/09/15   Historical  Provider, MD  Alcohol Swabs (B-D SINGLE USE SWABS REGULAR) PADS  10/21/15   Historical Provider, MD  buPROPion (WELLBUTRIN XL) 300 MG 24 hr tablet Take 300 mg by mouth at bedtime.     Historical Provider, MD  Carbamazepine (EQUETRO) 300 MG CP12 Take 300 mg by mouth 2 (two) times daily.     Historical Provider, MD  carvedilol (COREG) 25 MG tablet TAKE 1 TABLET BY MOUTH TWICE DAILY 09/08/15   Wellington Hampshire, MD  chlorthalidone (HYGROTON) 25 MG tablet  08/18/15   Historical Provider, MD  clonazePAM Bobbye [place]) 1 MG tablet  08/29/15   Historical Provider, MD  clopidogrel (PLAVIX) 75 MG tablet Take 75 mg by mouth daily.    Historical Provider, MD  doxepin (SINEQUAN) 25 MG capsule Take 50 mg by mouth at bedtime.     Historical Provider, MD  finasteride (PROSCAR) 5 MG tablet Take 5 mg by mouth daily.    Historical Provider, MD  gabapentin (NEURONTIN) 600 MG tablet Take 600 mg by mouth 3 (three) times daily.    Historical Provider, MD  JANUMET 50-1000 MG tablet  10/13/15   Historical Provider, MD  lisinopril (PRINIVIL,ZESTRIL) 40 MG tablet Take 1 tablet (40 mg total) by mouth daily. 10/25/13   Wellington Hampshire, MD  LORazepam (ATIVAN) 1 MG tablet Take 1-2 mg by mouth 2 (two) times daily. Pt takes one tablet in the morning and two at bedtime.    Historical Provider, MD  magnesium oxide (MAG-OX) 400 (241.3 Mg) MG tablet Take 400 mg by mouth daily.    Historical Provider, MD  metFORMIN (GLUCOPHAGE) 1000 MG tablet Take 1,000 mg by mouth 2 (two) times daily with a meal.    Historical Provider, MD  Multiple Vitamin (MULTIVITAMIN WITH MINERALS) TABS tablet Take 1 tablet by mouth daily.    Historical Provider, MD  nitroGLYCERIN (NITROSTAT) 0.4 MG SL tablet Place 0.4 mg under the tongue every 5 (five) minutes as needed for chest pain.     Historical Provider, MD  paliperidone (INVEGA SUSTENNA) 156 MG/ML SUSP injection  07/18/11   Historical Provider, MD  paliperidone (INVEGA) 6 MG 24 hr tablet Take 6 mg by mouth at  bedtime.     Historical Provider, MD  pantoprazole (PROTONIX) 40 MG tablet Take 40 mg by mouth daily.    Historical Provider, MD  potassium chloride SA (K-DUR,KLOR-CON) 20 MEQ tablet Take 20 mEq by mouth 2 (two) times daily.    Historical Provider, MD  ranolazine (RANEXA) 1000 MG SR tablet Take 1,000 mg by mouth 2 (two) times daily.     Historical Provider, MD  sitaGLIPtin (JANUVIA) 100 MG tablet Take 100 mg by mouth daily.    Historical Provider, MD  Tamsulosin HCl (FLOMAX) 0.4 MG CAPS Take 0.4 mg by mouth daily after breakfast.  Historical Provider, MD  topiramate (TOPAMAX) 100 MG tablet Take 100 mg by mouth daily.    Historical Provider, MD  traZODone (DESYREL) 50 MG tablet Take 50 mg by mouth at bedtime as needed for sleep.     Historical Provider, MD    Allergies Fioricet-codeine [butalbital-apap-caff-cod]; Morphine and related; Sulfa antibiotics; Synalgos-dc [aspirin-caff-dihydrocodeine]; and Synchlor a [chlorpheniramine]  Family History  Problem Relation Age of Onset  . Heart disease Mother   . Heart disease Brother   . Prostate cancer Father   . Heart disease    . Heart disease Brother   . Kidney disease Neg Hx     Social History Social History  Substance Use Topics  . Smoking status: Former Smoker    Years: 15.00    Types: Cigarettes    Quit date: 05/18/1999  . Smokeless tobacco: Never Used     Comment: quit 17 + years  . Alcohol use No    Review of Systems Constitutional: No fever/chills Eyes: No visual changes. ENT: No sore throat.Foreign body left ear Cardiovascular: Denies chest pain. Respiratory: Denies shortness of breath. Gastrointestinal: No abdominal pain.  No nausea, no vomiting.  No diarrhea.  No constipation. Genitourinary: Negative for dysuria. Musculoskeletal: Negative for back pain. Skin: Negative for rash. Neurological: Negative for headaches, focal weakness or numbness.    ____________________________________________   PHYSICAL  EXAM:  VITAL SIGNS: ED Triage Vitals [11/14/15 2226]  Enc Vitals Group     BP (!) 161/88     Pulse Rate 70     Resp 18     Temp 97.9 F (36.6 C)     Temp Source Oral     SpO2 97 %     Weight 220 lb (99.8 kg)     Height 6\' 3"  (1.905 m)     Head Circumference      Peak Flow      Pain Score 6     Pain Loc      Pain Edu?      Excl. in [place]?     Constitutional: Alert and oriented. Well appearing and in no acute distress. Eyes: Conjunctivae are normal. PERRL. EOMI. Head: Atraumatic. Nose: No congestion/rhinnorhea. Mouth/Throat: Mucous membranes are moist.  Oropharynx non-erythematous. Neck: No stridor.  No cervical spine tenderness to palpation. Hematological/Lymphatic/Immunilogical: No cervical lymphadenopathy. Cardiovascular: Normal rate, regular rhythm. Grossly normal heart sounds.  Good peripheral circulation.Elevated blood pressure Respiratory: Normal respiratory effort.  No retractions. Lungs CTAB. Gastrointestinal: Soft and nontender. No distention. No abdominal bruits. No CVA tenderness. Musculoskeletal: No lower extremity tenderness nor edema.  No joint effusions. Neurologic:  Normal speech and language. No gross focal neurologic deficits are appreciated. No gait instability. Skin:  Skin is warm, dry and intact. No rash noted. Psychiatric: Mood and affect are normal. Speech and behavior are normal.  ____________________________________________   LABS (all labs ordered are listed, but only abnormal results are displayed)  Labs Reviewed - No data to display ____________________________________________  EKG   ____________________________________________  RADIOLOGY   ____________________________________________   PROCEDURES  Procedure(s) performed: None  Procedures  Critical Care performed: No  ____________________________________________   INITIAL IMPRESSION / ASSESSMENT AND PLAN / ED COURSE  Pertinent labs & imaging results that were available  during my care of the patient were reviewed by me and considered in my medical decision making (see chart for details).  Foreign body left ear consistent of insect. Foreign body was removed with alligator forceps. Patient tolerated procedure. No complaints.  Clinical Course  ____________________________________________   FINAL CLINICAL IMPRESSION(S) / ED DIAGNOSES  Final diagnoses:  Foreign body in left ear, initial encounter      NEW MEDICATIONS STARTED DURING THIS VISIT:  New Prescriptions   No medications on file     Note:  This document was prepared using Dragon voice recognition software and may include unintentional dictation errors.    Sable Feil, PA-C 11/14/15 [place], MD 11/14/15 [phone]

## 2015-11-24 DIAGNOSIS — F250 Schizoaffective disorder, bipolar type: Secondary | ICD-10-CM | POA: Diagnosis not present

## 2015-12-01 DIAGNOSIS — I200 Unstable angina: Secondary | ICD-10-CM | POA: Diagnosis present

## 2015-12-04 ENCOUNTER — Ambulatory Visit (INDEPENDENT_AMBULATORY_CARE_PROVIDER_SITE_OTHER): Payer: Medicare HMO

## 2015-12-04 VITALS — BP 131/88 | HR 63 | Temp 97.9°F | Wt 225.5 lb

## 2015-12-04 DIAGNOSIS — N390 Urinary tract infection, site not specified: Secondary | ICD-10-CM | POA: Diagnosis not present

## 2015-12-04 LAB — MICROSCOPIC EXAMINATION: WBC, UA: >30 /hpf — AB (ref 0–?)

## 2015-12-04 LAB — URINALYSIS, COMPLETE
Bilirubin, UA: NEGATIVE
Glucose, UA: NEGATIVE
Ketones, UA: NEGATIVE
Nitrite, UA: POSITIVE — AB
PH UA: 7 (ref 5.0–7.5)
Protein, UA: NEGATIVE
Specific Gravity, UA: 1.015 (ref 1.005–1.030)
Urobilinogen, Ur: 0.2 mg/dL (ref 0.2–1.0)

## 2015-12-04 NOTE — Progress Notes (Signed)
In and Out Catheterization  Patient is present today for a I & O catheterization due to possible UTI. Patient was cleaned and prepped in a sterile fashion with betadine and Lidocaine 2% jelly was instilled into the urethra.  A 14FR cath was inserted no complications were noted , 519ml of urine return was noted, urine was amber in color. A clean urine sample was collected for u/a and cx. Bladder was drained  And catheter was removed with out difficulty.    Preformed by: Toniann Fail, LPN   Follow up/ Additional notes:  Pt described UTI s/s to be urinary frequency, urgency, burning on urination, leakage of uirne, and back pain. Pt stated that he only does CIC when he feels like he needs it.  Blood pressure 131/88, pulse 63, temperature 97.9 F (36.6 C), weight 225 lb 8 oz (102.3 kg).

## 2015-12-08 ENCOUNTER — Encounter: Payer: Self-pay | Admitting: Family Medicine

## 2015-12-08 ENCOUNTER — Telehealth: Payer: Self-pay | Admitting: Family Medicine

## 2015-12-08 LAB — CULTURE, URINE COMPREHENSIVE

## 2015-12-08 MED ORDER — NITROFURANTOIN MONOHYD MACRO 100 MG PO CAPS: 100.0000 mg | ORAL_CAPSULE | Freq: Two times a day (BID) | ORAL | 0 refills | Status: DC

## 2015-12-08 NOTE — Telephone Encounter (Signed)
Medication was sent to pharmacy. Unable to reach patient. The phone number on file does not except incoming calls. A letter was sent

## 2015-12-08 NOTE — Telephone Encounter (Signed)
-----   Message from Nori Riis, PA-C sent at 12/08/2015  8:30 AM EDT ----- Patient's urine culture is positive.  Please start him on Macrobid, twice daily for seven days.  He needs to CIC x 2 daily.  Regardless if he feels like he needs to or not.  His residuals are too high.

## 2015-12-09 ENCOUNTER — Telehealth: Payer: Self-pay | Admitting: Family Medicine

## 2015-12-10 ENCOUNTER — Ambulatory Visit
Admission: RE | Admit: 2015-12-10 | Discharge: 2015-12-10 | Disposition: A | Discharge status: Home / Self Care | Payer: Medicare HMO | Source: Ambulatory Visit | Attending: Internal Medicine | Admitting: Internal Medicine

## 2015-12-10 ENCOUNTER — Encounter
Admission: RE | Disposition: A | Discharge status: Home / Self Care | Payer: Self-pay | Source: Ambulatory Visit | Attending: Internal Medicine

## 2015-12-10 DIAGNOSIS — I10 Essential (primary) hypertension: Secondary | ICD-10-CM | POA: Insufficient documentation

## 2015-12-10 DIAGNOSIS — Z8249 Family history of ischemic heart disease and other diseases of the circulatory system: Secondary | ICD-10-CM | POA: Diagnosis not present

## 2015-12-10 DIAGNOSIS — Z91018 Allergy to other foods: Secondary | ICD-10-CM | POA: Diagnosis not present

## 2015-12-10 DIAGNOSIS — I200 Unstable angina: Secondary | ICD-10-CM | POA: Diagnosis present

## 2015-12-10 DIAGNOSIS — E7800 Pure hypercholesterolemia, unspecified: Secondary | ICD-10-CM | POA: Insufficient documentation

## 2015-12-10 DIAGNOSIS — Z801 Family history of malignant neoplasm of trachea, bronchus and lung: Secondary | ICD-10-CM | POA: Diagnosis not present

## 2015-12-10 DIAGNOSIS — Z79899 Other long term (current) drug therapy: Secondary | ICD-10-CM | POA: Diagnosis not present

## 2015-12-10 DIAGNOSIS — F319 Bipolar disorder, unspecified: Secondary | ICD-10-CM | POA: Diagnosis not present

## 2015-12-10 DIAGNOSIS — Z85828 Personal history of other malignant neoplasm of skin: Secondary | ICD-10-CM | POA: Diagnosis not present

## 2015-12-10 DIAGNOSIS — F329 Major depressive disorder, single episode, unspecified: Secondary | ICD-10-CM | POA: Insufficient documentation

## 2015-12-10 DIAGNOSIS — I2510 Atherosclerotic heart disease of native coronary artery without angina pectoris: Secondary | ICD-10-CM | POA: Insufficient documentation

## 2015-12-10 DIAGNOSIS — M5136 Other intervertebral disc degeneration, lumbar region: Secondary | ICD-10-CM | POA: Insufficient documentation

## 2015-12-10 DIAGNOSIS — Z8042 Family history of malignant neoplasm of prostate: Secondary | ICD-10-CM | POA: Diagnosis not present

## 2015-12-10 DIAGNOSIS — Z955 Presence of coronary angioplasty implant and graft: Secondary | ICD-10-CM | POA: Insufficient documentation

## 2015-12-10 DIAGNOSIS — E119 Type 2 diabetes mellitus without complications: Secondary | ICD-10-CM | POA: Diagnosis not present

## 2015-12-10 DIAGNOSIS — M5137 Other intervertebral disc degeneration, lumbosacral region: Secondary | ICD-10-CM | POA: Diagnosis not present

## 2015-12-10 DIAGNOSIS — Z8673 Personal history of transient ischemic attack (TIA), and cerebral infarction without residual deficits: Secondary | ICD-10-CM | POA: Insufficient documentation

## 2015-12-10 DIAGNOSIS — I252 Old myocardial infarction: Secondary | ICD-10-CM | POA: Diagnosis not present

## 2015-12-10 DIAGNOSIS — Z809 Family history of malignant neoplasm, unspecified: Secondary | ICD-10-CM | POA: Diagnosis not present

## 2015-12-10 DIAGNOSIS — G4730 Sleep apnea, unspecified: Secondary | ICD-10-CM | POA: Diagnosis not present

## 2015-12-10 DIAGNOSIS — K219 Gastro-esophageal reflux disease without esophagitis: Secondary | ICD-10-CM | POA: Diagnosis not present

## 2015-12-10 DIAGNOSIS — Z7982 Long term (current) use of aspirin: Secondary | ICD-10-CM | POA: Diagnosis not present

## 2015-12-10 DIAGNOSIS — Z886 Allergy status to analgesic agent status: Secondary | ICD-10-CM | POA: Insufficient documentation

## 2015-12-10 HISTORY — PX: CARDIAC CATHETERIZATION: SHX172

## 2015-12-10 LAB — GLUCOSE, CAPILLARY: Glucose-Capillary: 97 mg/dL (ref 65–99)

## 2015-12-10 SURGERY — LEFT HEART CATH AND CORONARY ANGIOGRAPHY
Anesthesia: Moderate Sedation

## 2015-12-10 MED ORDER — SODIUM CHLORIDE 0.9 % WEIGHT BASED INFUSION: 1.0000 mL/kg/h | INTRAVENOUS | Status: DC

## 2015-12-10 MED ORDER — SODIUM CHLORIDE 0.9 % WEIGHT BASED INFUSION: 3.0000 mL/kg/h | INTRAVENOUS | Status: DC

## 2015-12-10 MED ORDER — FENTANYL CITRATE (PF) 100 MCG/2ML IJ SOLN
INTRAMUSCULAR | Status: DC | PRN
  Administered 2015-12-10: 25 ug via INTRAVENOUS

## 2015-12-10 MED ORDER — IOPAMIDOL (ISOVUE-300) INJECTION 61%
INTRAVENOUS | Status: DC | PRN
  Administered 2015-12-10: 130 mL via INTRA_ARTERIAL

## 2015-12-10 MED ORDER — HEPARIN (PORCINE) IN NACL 2-0.9 UNIT/ML-% IJ SOLN
INTRAMUSCULAR | Status: AC
  Filled 2015-12-10: qty 500

## 2015-12-10 MED ORDER — MIDAZOLAM HCL 2 MG/2ML IJ SOLN
INTRAMUSCULAR | Status: DC | PRN
  Administered 2015-12-10: 1 mg via INTRAVENOUS

## 2015-12-10 MED ORDER — MIDAZOLAM HCL 2 MG/2ML IJ SOLN
INTRAMUSCULAR | Status: AC
  Filled 2015-12-10: qty 2

## 2015-12-10 MED ORDER — SODIUM CHLORIDE 0.9 % IV SOLN: 250.0000 mL | INTRAVENOUS | Status: DC | PRN

## 2015-12-10 MED ORDER — SODIUM CHLORIDE 0.9% FLUSH: 3.0000 mL | INTRAVENOUS | Status: DC | PRN

## 2015-12-10 MED ORDER — SODIUM CHLORIDE 0.9% FLUSH: 3.0000 mL | Freq: Two times a day (BID) | INTRAVENOUS | Status: DC

## 2015-12-10 MED ORDER — FENTANYL CITRATE (PF) 100 MCG/2ML IJ SOLN
INTRAMUSCULAR | Status: AC
  Filled 2015-12-10: qty 2

## 2015-12-10 MED ORDER — ASPIRIN 81 MG PO CHEW: 81.0000 mg | CHEWABLE_TABLET | ORAL | Status: DC

## 2015-12-10 SURGICAL SUPPLY — 10 items
CATH 5FR JR4 DIAGNOSTIC (CATHETERS) ×2 IMPLANT
CATH INFINITI 5FR ANG PIGTAIL (CATHETERS) ×2 IMPLANT
CATH INFINITI 5FR JL4 (CATHETERS) ×2 IMPLANT
DEVICE CLOSURE MYNXGRIP 5F (Vascular Products) ×2 IMPLANT
KIT MANI 3VAL PERCEP (MISCELLANEOUS) ×3 IMPLANT
NDL PERC 18GX7CM (NEEDLE) IMPLANT
NEEDLE PERC 18GX7CM (NEEDLE) ×3 IMPLANT
PACK CARDIAC CATH (CUSTOM PROCEDURE TRAY) ×3 IMPLANT
SHEATH PINNACLE 5F 10CM (SHEATH) ×2 IMPLANT
WIRE EMERALD 3MM-J .035X150CM (WIRE) ×2 IMPLANT

## 2015-12-11 ENCOUNTER — Encounter: Payer: Self-pay | Admitting: *Deleted

## 2015-12-14 ENCOUNTER — Encounter: Payer: Self-pay | Admitting: Thoracic Surgery (Cardiothoracic Vascular Surgery)

## 2015-12-17 ENCOUNTER — Encounter: Payer: Self-pay | Admitting: Thoracic Surgery (Cardiothoracic Vascular Surgery)

## 2015-12-18 ENCOUNTER — Institutional Professional Consult (permissible substitution) (INDEPENDENT_AMBULATORY_CARE_PROVIDER_SITE_OTHER): Payer: Medicare HMO | Admitting: Thoracic Surgery (Cardiothoracic Vascular Surgery)

## 2015-12-18 ENCOUNTER — Other Ambulatory Visit: Payer: Self-pay | Admitting: *Deleted

## 2015-12-18 ENCOUNTER — Encounter: Payer: Self-pay | Admitting: Thoracic Surgery (Cardiothoracic Vascular Surgery)

## 2015-12-18 VITALS — BP 124/79 | HR 71 | Resp 16 | Ht 75.0 in | Wt 225.0 lb

## 2015-12-18 DIAGNOSIS — I2510 Atherosclerotic heart disease of native coronary artery without angina pectoris: Secondary | ICD-10-CM
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
## 2015-12-22 ENCOUNTER — Encounter (HOSPITAL_COMMUNITY)
Admission: RE | Admit: 2015-12-22 | Discharge: 2015-12-22 | Disposition: A | Discharge status: Home / Self Care | Payer: Medicare HMO | Source: Ambulatory Visit | Attending: Thoracic Surgery (Cardiothoracic Vascular Surgery) | Admitting: Thoracic Surgery (Cardiothoracic Vascular Surgery)

## 2015-12-22 ENCOUNTER — Encounter (HOSPITAL_COMMUNITY): Payer: Self-pay

## 2015-12-22 ENCOUNTER — Ambulatory Visit (HOSPITAL_COMMUNITY)
Admission: RE | Admit: 2015-12-22 | Discharge: 2015-12-22 | Disposition: A | Discharge status: Home / Self Care | Payer: Medicare HMO | Source: Ambulatory Visit | Attending: Thoracic Surgery (Cardiothoracic Vascular Surgery) | Admitting: Thoracic Surgery (Cardiothoracic Vascular Surgery)

## 2015-12-22 ENCOUNTER — Ambulatory Visit (HOSPITAL_BASED_OUTPATIENT_CLINIC_OR_DEPARTMENT_OTHER)
Admission: RE | Admit: 2015-12-22 | Discharge: 2015-12-22 | Disposition: A | Discharge status: Home / Self Care | Payer: Medicare HMO | Source: Ambulatory Visit | Attending: Thoracic Surgery (Cardiothoracic Vascular Surgery) | Admitting: Thoracic Surgery (Cardiothoracic Vascular Surgery)

## 2015-12-22 DIAGNOSIS — Z01818 Encounter for other preprocedural examination: Secondary | ICD-10-CM | POA: Insufficient documentation

## 2015-12-22 DIAGNOSIS — R918 Other nonspecific abnormal finding of lung field: Secondary | ICD-10-CM

## 2015-12-22 DIAGNOSIS — I2510 Atherosclerotic heart disease of native coronary artery without angina pectoris: Secondary | ICD-10-CM

## 2015-12-22 HISTORY — DX: Parkinson's disease: G20

## 2015-12-22 HISTORY — DX: Acute myocardial infarction, unspecified: I21.9

## 2015-12-22 HISTORY — DX: Angina pectoris, unspecified: I20.9

## 2015-12-22 HISTORY — DX: Parkinson's disease without dyskinesia, without mention of fluctuations: G20.A1

## 2015-12-22 HISTORY — DX: Cerebral infarction, unspecified: I63.9

## 2015-12-22 LAB — ABO/RH: ABO/RH(D): A POS

## 2015-12-22 LAB — COMPREHENSIVE METABOLIC PANEL
ALK PHOS: 44 U/L (ref 38–126)
ALT: 22 U/L (ref 17–63)
AST: 26 U/L (ref 15–41)
Albumin: 3.9 g/dL (ref 3.5–5.0)
Anion gap: 10 (ref 5–15)
BUN: 16 mg/dL (ref 6–20)
CALCIUM: 9.5 mg/dL (ref 8.9–10.3)
CO2: 20 mmol/L — ABNORMAL LOW (ref 22–32)
CREATININE: 1.07 mg/dL (ref 0.61–1.24)
Chloride: 108 mmol/L (ref 101–111)
Glucose, Bld: 101 mg/dL — ABNORMAL HIGH (ref 65–99)
Potassium: 3.6 mmol/L (ref 3.5–5.1)
Sodium: 138 mmol/L (ref 135–145)
TOTAL PROTEIN: 6.3 g/dL — AB (ref 6.5–8.1)
Total Bilirubin: 0.6 mg/dL (ref 0.3–1.2)

## 2015-12-22 LAB — URINALYSIS, ROUTINE W REFLEX MICROSCOPIC
Bilirubin Urine: NEGATIVE
GLUCOSE, UA: NEGATIVE mg/dL
Hgb urine dipstick: NEGATIVE
Ketones, ur: NEGATIVE mg/dL
LEUKOCYTES UA: NEGATIVE
Nitrite: NEGATIVE
PH: 6.5 (ref 5.0–8.0)
Protein, ur: NEGATIVE mg/dL
Specific Gravity, Urine: 1.006 (ref 1.005–1.030)

## 2015-12-22 LAB — VAS US DOPPLER PRE CABG
LCCADDIAS: 21 cm/s
LCCADSYS: 70 cm/s
LCCAPDIAS: 14 cm/s
LEFT ECA DIAS: -14 cm/s
LEFT VERTEBRAL DIAS: -12 cm/s
LICADSYS: -71 cm/s
Left CCA prox sys: 83 cm/s
Left ICA dist dias: -25 cm/s
Left ICA prox dias: -20 cm/s
Left ICA prox sys: -64 cm/s
RCCADSYS: -59 cm/s
RCCAPSYS: 78 cm/s
RIGHT ECA DIAS: -13 cm/s
RIGHT VERTEBRAL DIAS: 14 cm/s
Right CCA prox dias: 14 cm/s

## 2015-12-22 LAB — PROTIME-INR
INR: 0.97
PROTHROMBIN TIME: 12.8 s (ref 11.4–15.2)

## 2015-12-22 LAB — BLOOD GAS, ARTERIAL
Acid-base deficit: 1.3 mmol/L (ref 0.0–2.0)
Bicarbonate: 23.1 mmol/L (ref 20.0–28.0)
Drawn by: 449841
FIO2: 21
O2 SAT: 96.6 %
PCO2 ART: 39.8 mmHg (ref 32.0–48.0)
PO2 ART: 90.4 mmHg (ref 83.0–108.0)
Patient temperature: 98.6
pH, Arterial: 7.381 (ref 7.350–7.450)

## 2015-12-22 LAB — CBC
HCT: 37.2 % — ABNORMAL LOW (ref 39.0–52.0)
HEMOGLOBIN: 12.2 g/dL — AB (ref 13.0–17.0)
MCH: 30.3 pg (ref 26.0–34.0)
MCHC: 32.8 g/dL (ref 30.0–36.0)
MCV: 92.5 fL (ref 78.0–100.0)
PLATELETS: 195 10*3/uL (ref 150–400)
RBC: 4.02 MIL/uL — AB (ref 4.22–5.81)
RDW: 12.8 % (ref 11.5–15.5)
WBC: 5 10*3/uL (ref 4.0–10.5)

## 2015-12-22 LAB — APTT: aPTT: 32 seconds (ref 24–36)

## 2015-12-22 LAB — SURGICAL PCR SCREEN
MRSA, PCR: NEGATIVE
Staphylococcus aureus: NEGATIVE

## 2015-12-22 LAB — GLUCOSE, CAPILLARY: GLUCOSE-CAPILLARY: 93 mg/dL (ref 65–99)

## 2015-12-22 NOTE — Pre-Procedure Instructions (Addendum)
Roger Pruitt  12/22/2015      [facility] - [place], [place] [address] AT [address] [place] [address] Phone: [phone] Fax: [phone]  [place], [place] [address] [place] [address] Phone: [phone] Fax: [phone]    Your procedure is scheduled on   Friday  12/25/15  Report to [facility] Admitting at 530 A.M.  Call this number if you have problems the morning of surgery:  [phone]   Remember:  Do not eat food or drink liquids after midnight.  Take these medicines the morning of surgery with A SIP OF WATER   CARBAMAZEPINE (EQUETRO), CARVEDILOL (COREG), FINASTERIDE, GABAPENTIN, RANOLAZINE (RANEXA), TAMSULOSIN             (STOP NOW ASPIRIN OR ASPIRIN PRODUCTS, IBUPROFEN/ ADVIL/ MOTRIN, GOODY POWDERS/ BC'S, HERBAL MEDICINES, PLAVIX - STOP 5 DAYS AS DIRECTED)    How to Manage Your Diabetes Before and After Surgery  Why is it important to control my blood sugar before and after surgery? . Improving blood sugar levels before and after surgery helps healing and can limit problems. . A way of improving blood sugar control is eating a healthy diet by: o  Eating less sugar and carbohydrates o  Increasing activity/exercise o  Talking with your doctor about reaching your blood sugar goals . High blood sugars (greater than 180 mg/dL) can raise your risk of infections and slow your recovery, so you will need to focus on controlling your diabetes during the weeks before surgery. . Make sure that the doctor who takes care of your diabetes knows about your planned surgery including the date and location.  How do I manage my blood sugar before surgery? . Check your blood sugar at least 4 times a day, starting 2 days before surgery, to make sure that the level is not too high or low. o Check your blood sugar the morning of your surgery when  you wake up and every 2 hours until you get to the Short Stay unit. . If your blood sugar is less than 70 mg/dL, you will need to treat for low blood sugar: o Do not take insulin. o Treat a low blood sugar (less than 70 mg/dL) with  cup of clear juice (cranberry or apple), 4 glucose tablets, OR glucose gel. o Recheck blood sugar in 15 minutes after treatment (to make sure it is greater than 70 mg/dL). If your blood sugar is not greater than 70 mg/dL on recheck, call [phone] for further instructions. . Report your blood sugar to the short stay nurse when you get to Short Stay.  . If you are admitted to the hospital after surgery: o Your blood sugar will be checked by the staff and you will probably be given insulin after surgery (instead of oral diabetes medicines) to make sure you have good blood sugar levels. o The goal for blood sugar control after surgery is 80-180 mg/dL.              WHAT DO I DO ABOUT MY DIABETES MEDICATION?   Marland Kitchen Do not take oral diabetes medicines (pills) the morning of surgery.      Other Instructions:          Patient Signature:  Date:   Nurse Signature:  Date:   Reviewed and Endorsed by [facility]  Health Patient Education Committee, August 2015  Do not wear jewelry, make-up or nail polish.  Do not wear lotions, powders, or perfumes, or deoderant.  Do not shave 48 hours prior to surgery.  Men may shave face and neck.  Do not bring valuables to the hospital.  [facility] is not responsible for any belongings or valuables.  Contacts, dentures or bridgework may not be worn into surgery.  Leave your suitcase in the car.  After surgery it may be brought to your room.  For patients admitted to the hospital, discharge time will be determined by your treatment team.  Patients discharged the day of surgery will not be allowed to drive home.   Name and phone number of your driver:    Special instructions:  SEE PREPARING FOR SURGERY  Please read  over the following fact sheets that you were given. MRSA Information and Surgical Site Infection Prevention

## 2015-12-22 NOTE — Progress Notes (Signed)
Pre-op Cardiac Surgery  Carotid Findings:  No evidence of a significant stenosis noted in bilateral carotid arteries.   Upper Extremity Right Left  Brachial Pressures 136 151  Radial Waveforms Triphasic Triphasic  Ulnar Waveforms Triphasic Triphasic  Palmar Arch (Allen's Test) WNL WNL   Findings:  Doppler waveforms remain within normal limits with bilateral radial and ulnar arteries compressions.    Lower  Extremity Right Left  Dorsalis Pedis 173 163  Posterior Tibial 205 201  Ankle/Brachial Indices 1.3 1.3    Findings:  ABIs normal at rest.   Oda Cogan, BS, RDMS, RVT

## 2015-12-22 NOTE — Progress Notes (Signed)
Spoke with Zella Richer in respiratory here at [facility] who stated she did not have patient down for PFTs.  Zella Richer called and spoke with Thurmond Butts at office who stated Dr. Roxan Hockey has okayed using PFTs from July.  We do not need to repeat.

## 2015-12-23 LAB — HEMOGLOBIN A1C
Hgb A1c MFr Bld: 5.1 % (ref 4.8–5.6)
Mean Plasma Glucose: 100 mg/dL

## 2015-12-24 MED ORDER — EPINEPHRINE HCL 1 MG/ML IJ SOLN
0.0000 ug/min | INTRAVENOUS | Status: DC
  Filled 2015-12-24: qty 4

## 2015-12-24 MED ORDER — DEXMEDETOMIDINE HCL IN NACL 400 MCG/100ML IV SOLN
0.1000 ug/kg/h | INTRAVENOUS | Status: AC
  Administered 2015-12-25: .3 ug/kg/h via INTRAVENOUS
  Administered 2015-12-25: 12:00:00 via INTRAVENOUS
  Filled 2015-12-24: qty 100

## 2015-12-24 MED ORDER — SODIUM CHLORIDE 0.9 % IV SOLN
INTRAVENOUS | Status: AC
  Administered 2015-12-25: 12:00:00 via INTRAVENOUS
  Administered 2015-12-25: 69.8 mL/h via INTRAVENOUS
  Filled 2015-12-24: qty 40

## 2015-12-24 MED ORDER — PHENYLEPHRINE HCL 10 MG/ML IJ SOLN
30.0000 ug/min | INTRAMUSCULAR | Status: AC
  Administered 2015-12-25: 15 ug/min via INTRAVENOUS
  Filled 2015-12-24: qty 2

## 2015-12-24 MED ORDER — NITROGLYCERIN IN D5W 200-5 MCG/ML-% IV SOLN
2.0000 ug/min | INTRAVENOUS | Status: DC
  Filled 2015-12-24: qty 250

## 2015-12-24 MED ORDER — DEXTROSE 5 % IV SOLN
1.5000 g | INTRAVENOUS | Status: AC
  Administered 2015-12-25: 1.5 g via INTRAVENOUS
  Administered 2015-12-25: .75 g via INTRAVENOUS
  Filled 2015-12-24 (×2): qty 1.5

## 2015-12-24 MED ORDER — VANCOMYCIN HCL 10 G IV SOLR
1500.0000 mg | INTRAVENOUS | Status: AC
  Administered 2015-12-25: 1500 mg via INTRAVENOUS
  Filled 2015-12-24: qty 1500

## 2015-12-24 MED ORDER — POTASSIUM CHLORIDE 2 MEQ/ML IV SOLN
80.0000 meq | INTRAVENOUS | Status: DC
  Filled 2015-12-24: qty 40

## 2015-12-24 MED ORDER — HEPARIN SODIUM (PORCINE) 1000 UNIT/ML IJ SOLN
INTRAMUSCULAR | Status: DC
  Filled 2015-12-24: qty 30

## 2015-12-24 MED ORDER — DEXTROSE 5 % IV SOLN
750.0000 mg | INTRAVENOUS | Status: DC
  Filled 2015-12-24: qty 750

## 2015-12-24 MED ORDER — MAGNESIUM SULFATE 50 % IJ SOLN
40.0000 meq | INTRAMUSCULAR | Status: DC
  Filled 2015-12-24: qty 10

## 2015-12-24 MED ORDER — SODIUM CHLORIDE 0.9 % IV SOLN
INTRAVENOUS | Status: AC
  Administered 2015-12-25: .5 [IU]/h via INTRAVENOUS
  Filled 2015-12-24: qty 2.5

## 2015-12-24 MED ORDER — PLASMA-LYTE 148 IV SOLN
INTRAVENOUS | Status: AC
  Administered 2015-12-25: 500 mL
  Filled 2015-12-24: qty 2.5

## 2015-12-24 MED ORDER — DOPAMINE-DEXTROSE 3.2-5 MG/ML-% IV SOLN
0.0000 ug/kg/min | INTRAVENOUS | Status: DC
  Filled 2015-12-24: qty 250

## 2015-12-24 NOTE — Anesthesia Preprocedure Evaluation (Addendum)
Anesthesia Evaluation  Patient identified by MRN, date of birth, ID band Patient awake    Reviewed: Allergy & Precautions, NPO status , Patient's Chart, lab work & pertinent test results, reviewed documented beta blocker date and time   History of Anesthesia Complications Negative for: history of anesthetic complications  Airway Mallampati: II  TM Distance: >3 FB Neck ROM: Full    Dental  (+) Edentulous Upper, Edentulous Lower   Pulmonary sleep apnea , former smoker,    breath sounds clear to auscultation       Cardiovascular hypertension, Pt. on medications and Pt. on home beta blockers (-) angina+ CAD (3v ASCADz), + Past MI and + Cardiac Stents   Rhythm:Regular Rate:Normal  Normal LVF    Neuro/Psych Anxiety Depression Bipolar Disorder Parkinson's CVA, No Residual Symptoms    GI/Hepatic Neg liver ROS, GERD  Medicated and Controlled,  Endo/Other  diabetes, Oral Hypoglycemic Agents  Renal/GU negative Renal ROS     Musculoskeletal  (+) Arthritis , Osteoarthritis,    Abdominal   Peds  Hematology negative hematology ROS (+)   Anesthesia Other Findings   Reproductive/Obstetrics                            Anesthesia Physical Anesthesia Plan  ASA: III  Anesthesia Plan: General   Post-op Pain Management:    Induction: Intravenous  Airway Management Planned: Oral ETT  Additional Equipment: Arterial line, CVP, PA Cath, Ultrasound Guidance Line Placement and TEE  Intra-op Plan:   Post-operative Plan: Post-operative intubation/ventilation  Informed Consent: I have reviewed the patients History and Physical, chart, labs and discussed the procedure including the risks, benefits and alternatives for the proposed anesthesia with the patient or authorized representative who has indicated his/her understanding and acceptance.     Plan Discussed with: CRNA and Surgeon  Anesthesia Plan  Comments: (Plan routine monitors, A line, PA cath, GETA with post op intubation )        Anesthesia Quick Evaluation

## 2015-12-25 ENCOUNTER — Encounter (HOSPITAL_COMMUNITY): Payer: Self-pay | Admitting: *Deleted

## 2015-12-25 ENCOUNTER — Inpatient Hospital Stay (HOSPITAL_COMMUNITY)
Admission: RE | Admit: 2015-12-25 | Discharge: 2015-12-30 | DRG: 236 | Disposition: A | Discharge status: Home Health | Payer: Medicare HMO | Source: Ambulatory Visit | Attending: Thoracic Surgery (Cardiothoracic Vascular Surgery) | Admitting: Thoracic Surgery (Cardiothoracic Vascular Surgery)

## 2015-12-25 ENCOUNTER — Inpatient Hospital Stay (HOSPITAL_COMMUNITY): Payer: Medicare HMO | Admitting: Certified Registered Nurse Anesthetist

## 2015-12-25 ENCOUNTER — Inpatient Hospital Stay (HOSPITAL_COMMUNITY): Payer: Medicare HMO

## 2015-12-25 ENCOUNTER — Encounter (HOSPITAL_COMMUNITY)
Admission: RE | Disposition: A | Discharge status: Home Health | Payer: Self-pay | Source: Ambulatory Visit | Attending: Thoracic Surgery (Cardiothoracic Vascular Surgery)

## 2015-12-25 ENCOUNTER — Inpatient Hospital Stay (HOSPITAL_COMMUNITY): Payer: Medicare HMO | Admitting: Emergency Medicine

## 2015-12-25 DIAGNOSIS — E876 Hypokalemia: Secondary | ICD-10-CM | POA: Diagnosis not present

## 2015-12-25 DIAGNOSIS — R079 Chest pain, unspecified: Secondary | ICD-10-CM | POA: Diagnosis not present

## 2015-12-25 DIAGNOSIS — Z8673 Personal history of transient ischemic attack (TIA), and cerebral infarction without residual deficits: Secondary | ICD-10-CM | POA: Diagnosis not present

## 2015-12-25 DIAGNOSIS — E785 Hyperlipidemia, unspecified: Secondary | ICD-10-CM | POA: Diagnosis present

## 2015-12-25 DIAGNOSIS — Z87891 Personal history of nicotine dependence: Secondary | ICD-10-CM | POA: Diagnosis not present

## 2015-12-25 DIAGNOSIS — G4730 Sleep apnea, unspecified: Secondary | ICD-10-CM | POA: Diagnosis present

## 2015-12-25 DIAGNOSIS — I2510 Atherosclerotic heart disease of native coronary artery without angina pectoris: Secondary | ICD-10-CM

## 2015-12-25 DIAGNOSIS — Z955 Presence of coronary angioplasty implant and graft: Secondary | ICD-10-CM | POA: Diagnosis not present

## 2015-12-25 DIAGNOSIS — Z85828 Personal history of other malignant neoplasm of skin: Secondary | ICD-10-CM

## 2015-12-25 DIAGNOSIS — J9811 Atelectasis: Secondary | ICD-10-CM | POA: Diagnosis not present

## 2015-12-25 DIAGNOSIS — I712 Thoracic aortic aneurysm, without rupture: Secondary | ICD-10-CM | POA: Diagnosis present

## 2015-12-25 DIAGNOSIS — G20 Parkinson's disease: Secondary | ICD-10-CM | POA: Diagnosis present

## 2015-12-25 DIAGNOSIS — Z79899 Other long term (current) drug therapy: Secondary | ICD-10-CM | POA: Diagnosis not present

## 2015-12-25 DIAGNOSIS — J90 Pleural effusion, not elsewhere classified: Secondary | ICD-10-CM | POA: Diagnosis not present

## 2015-12-25 DIAGNOSIS — I25119 Atherosclerotic heart disease of native coronary artery with unspecified angina pectoris: Principal | ICD-10-CM | POA: Diagnosis present

## 2015-12-25 DIAGNOSIS — K219 Gastro-esophageal reflux disease without esophagitis: Secondary | ICD-10-CM | POA: Diagnosis present

## 2015-12-25 DIAGNOSIS — M1990 Unspecified osteoarthritis, unspecified site: Secondary | ICD-10-CM | POA: Diagnosis present

## 2015-12-25 DIAGNOSIS — F319 Bipolar disorder, unspecified: Secondary | ICD-10-CM | POA: Diagnosis present

## 2015-12-25 DIAGNOSIS — J939 Pneumothorax, unspecified: Secondary | ICD-10-CM | POA: Diagnosis not present

## 2015-12-25 DIAGNOSIS — F419 Anxiety disorder, unspecified: Secondary | ICD-10-CM | POA: Diagnosis present

## 2015-12-25 DIAGNOSIS — D62 Acute posthemorrhagic anemia: Secondary | ICD-10-CM | POA: Diagnosis not present

## 2015-12-25 DIAGNOSIS — I10 Essential (primary) hypertension: Secondary | ICD-10-CM | POA: Diagnosis present

## 2015-12-25 DIAGNOSIS — N401 Enlarged prostate with lower urinary tract symptoms: Secondary | ICD-10-CM | POA: Diagnosis present

## 2015-12-25 DIAGNOSIS — Z951 Presence of aortocoronary bypass graft: Secondary | ICD-10-CM

## 2015-12-25 DIAGNOSIS — E119 Type 2 diabetes mellitus without complications: Secondary | ICD-10-CM | POA: Diagnosis present

## 2015-12-25 DIAGNOSIS — I252 Old myocardial infarction: Secondary | ICD-10-CM

## 2015-12-25 DIAGNOSIS — R05 Cough: Secondary | ICD-10-CM | POA: Diagnosis not present

## 2015-12-25 DIAGNOSIS — R262 Difficulty in walking, not elsewhere classified: Secondary | ICD-10-CM

## 2015-12-25 HISTORY — PX: CORONARY ARTERY BYPASS GRAFT: SHX141

## 2015-12-25 HISTORY — PX: TEE WITHOUT CARDIOVERSION: SHX5443

## 2015-12-25 LAB — GLUCOSE, CAPILLARY
GLUCOSE-CAPILLARY: 129 mg/dL — AB (ref 65–99)
GLUCOSE-CAPILLARY: 156 mg/dL — AB (ref 65–99)
GLUCOSE-CAPILLARY: 131 mg/dL — AB (ref 65–99)
Glucose-Capillary: 79 mg/dL (ref 65–99)
Glucose-Capillary: 99 mg/dL (ref 65–99)
Glucose-Capillary: 97 mg/dL (ref 65–99)
Glucose-Capillary: 114 mg/dL — ABNORMAL HIGH (ref 65–99)
Glucose-Capillary: 145 mg/dL — ABNORMAL HIGH (ref 65–99)
Glucose-Capillary: 163 mg/dL — ABNORMAL HIGH (ref 65–99)
Glucose-Capillary: 149 mg/dL — ABNORMAL HIGH (ref 65–99)

## 2015-12-25 LAB — POCT I-STAT 3, ART BLOOD GAS (G3+)
ACID-BASE DEFICIT: 1 mmol/L (ref 0.0–2.0)
ACID-BASE DEFICIT: 7 mmol/L — AB (ref 0.0–2.0)
Acid-base deficit: 6 mmol/L — ABNORMAL HIGH (ref 0.0–2.0)
Acid-base deficit: 2 mmol/L (ref 0.0–2.0)
Acid-base deficit: 3 mmol/L — ABNORMAL HIGH (ref 0.0–2.0)
BICARBONATE: 22.6 mmol/L (ref 20.0–28.0)
Bicarbonate: 24.3 mmol/L (ref 20.0–28.0)
Bicarbonate: 19.8 mmol/L — ABNORMAL LOW (ref 20.0–28.0)
Bicarbonate: 21.2 mmol/L (ref 20.0–28.0)
Bicarbonate: 23.3 mmol/L (ref 20.0–28.0)
O2 SAT: 100 %
O2 SAT: 100 %
O2 SAT: 98 %
O2 Saturation: 93 %
O2 Saturation: 95 %
PCO2 ART: 44.5 mmHg (ref 32.0–48.0)
PCO2 ART: 49.4 mmHg — AB (ref 32.0–48.0)
PCO2 ART: 43.9 mmHg (ref 32.0–48.0)
PCO2 ART: 42 mmHg (ref 32.0–48.0)
PH ART: 7.24 — AB (ref 7.350–7.450)
PH ART: 7.334 — AB (ref 7.350–7.450)
PH ART: 7.34 — AB (ref 7.350–7.450)
PO2 ART: 79 mmHg — AB (ref 83.0–108.0)
TCO2: 26 mmol/L (ref 0–100)
TCO2: 21 mmol/L (ref 0–100)
TCO2: 23 mmol/L (ref 0–100)
TCO2: 25 mmol/L (ref 0–100)
TCO2: 24 mmol/L (ref 0–100)
pCO2 arterial: 43.8 mmHg (ref 32.0–48.0)
pH, Arterial: 7.352 (ref 7.350–7.450)
pH, Arterial: 7.257 — ABNORMAL LOW (ref 7.350–7.450)
pO2, Arterial: 420 mmHg — ABNORMAL HIGH (ref 83.0–108.0)
pO2, Arterial: 226 mmHg — ABNORMAL HIGH (ref 83.0–108.0)
pO2, Arterial: 80 mmHg — ABNORMAL LOW (ref 83.0–108.0)
pO2, Arterial: 116 mmHg — ABNORMAL HIGH (ref 83.0–108.0)

## 2015-12-25 LAB — POCT I-STAT, CHEM 8
BUN: 23 mg/dL — ABNORMAL HIGH (ref 6–20)
BUN: 21 mg/dL — AB (ref 6–20)
BUN: 22 mg/dL — AB (ref 6–20)
BUN: 21 mg/dL — ABNORMAL HIGH (ref 6–20)
BUN: 21 mg/dL — AB (ref 6–20)
BUN: 24 mg/dL — AB (ref 6–20)
CALCIUM ION: 1.32 mmol/L (ref 1.15–1.40)
CALCIUM ION: 1.19 mmol/L (ref 1.15–1.40)
CALCIUM ION: 1.17 mmol/L (ref 1.15–1.40)
CHLORIDE: 104 mmol/L (ref 101–111)
CHLORIDE: 101 mmol/L (ref 101–111)
CHLORIDE: 103 mmol/L (ref 101–111)
CREATININE: 0.8 mg/dL (ref 0.61–1.24)
Calcium, Ion: 1.14 mmol/L — ABNORMAL LOW (ref 1.15–1.40)
Calcium, Ion: 1.24 mmol/L (ref 1.15–1.40)
Calcium, Ion: 1.15 mmol/L (ref 1.15–1.40)
Chloride: 104 mmol/L (ref 101–111)
Chloride: 103 mmol/L (ref 101–111)
Chloride: 104 mmol/L (ref 101–111)
Creatinine, Ser: 1.1 mg/dL (ref 0.61–1.24)
Creatinine, Ser: 1 mg/dL (ref 0.61–1.24)
Creatinine, Ser: 1 mg/dL (ref 0.61–1.24)
Creatinine, Ser: 1.1 mg/dL (ref 0.61–1.24)
Creatinine, Ser: 1 mg/dL (ref 0.61–1.24)
GLUCOSE: 127 mg/dL — AB (ref 65–99)
GLUCOSE: 189 mg/dL — AB (ref 65–99)
Glucose, Bld: 85 mg/dL (ref 65–99)
Glucose, Bld: 96 mg/dL (ref 65–99)
Glucose, Bld: 178 mg/dL — ABNORMAL HIGH (ref 65–99)
Glucose, Bld: 146 mg/dL — ABNORMAL HIGH (ref 65–99)
HCT: 30 % — ABNORMAL LOW (ref 39.0–52.0)
HCT: 26 % — ABNORMAL LOW (ref 39.0–52.0)
HCT: 25 % — ABNORMAL LOW (ref 39.0–52.0)
HCT: 25 % — ABNORMAL LOW (ref 39.0–52.0)
HEMATOCRIT: 26 % — AB (ref 39.0–52.0)
HEMATOCRIT: 25 % — AB (ref 39.0–52.0)
HEMOGLOBIN: 10.2 g/dL — AB (ref 13.0–17.0)
HEMOGLOBIN: 8.5 g/dL — AB (ref 13.0–17.0)
Hemoglobin: 8.8 g/dL — ABNORMAL LOW (ref 13.0–17.0)
Hemoglobin: 8.8 g/dL — ABNORMAL LOW (ref 13.0–17.0)
Hemoglobin: 8.5 g/dL — ABNORMAL LOW (ref 13.0–17.0)
Hemoglobin: 8.5 g/dL — ABNORMAL LOW (ref 13.0–17.0)
Potassium: 3.6 mmol/L (ref 3.5–5.1)
Potassium: 3.4 mmol/L — ABNORMAL LOW (ref 3.5–5.1)
Potassium: 3.5 mmol/L (ref 3.5–5.1)
Potassium: 4.4 mmol/L (ref 3.5–5.1)
Potassium: 3.6 mmol/L (ref 3.5–5.1)
Potassium: 5.4 mmol/L — ABNORMAL HIGH (ref 3.5–5.1)
SODIUM: 142 mmol/L (ref 135–145)
SODIUM: 143 mmol/L (ref 135–145)
SODIUM: 141 mmol/L (ref 135–145)
SODIUM: 139 mmol/L (ref 135–145)
Sodium: 142 mmol/L (ref 135–145)
Sodium: 140 mmol/L (ref 135–145)
TCO2: 26 mmol/L (ref 0–100)
TCO2: 24 mmol/L (ref 0–100)
TCO2: 26 mmol/L (ref 0–100)
TCO2: 24 mmol/L (ref 0–100)
TCO2: 23 mmol/L (ref 0–100)
TCO2: 25 mmol/L (ref 0–100)

## 2015-12-25 LAB — CBC
HCT: 26.6 % — ABNORMAL LOW (ref 39.0–52.0)
HEMATOCRIT: 24.7 % — AB (ref 39.0–52.0)
HEMOGLOBIN: 8.4 g/dL — AB (ref 13.0–17.0)
Hemoglobin: 8.9 g/dL — ABNORMAL LOW (ref 13.0–17.0)
MCH: 30.9 pg (ref 26.0–34.0)
MCH: 30.6 pg (ref 26.0–34.0)
MCHC: 34 g/dL (ref 30.0–36.0)
MCHC: 33.5 g/dL (ref 30.0–36.0)
MCV: 90.8 fL (ref 78.0–100.0)
MCV: 91.4 fL (ref 78.0–100.0)
PLATELETS: 116 10*3/uL — AB (ref 150–400)
PLATELETS: 138 10*3/uL — AB (ref 150–400)
RBC: 2.72 MIL/uL — ABNORMAL LOW (ref 4.22–5.81)
RBC: 2.91 MIL/uL — ABNORMAL LOW (ref 4.22–5.81)
RDW: 12.8 % (ref 11.5–15.5)
RDW: 12.7 % (ref 11.5–15.5)
WBC: 8.3 10*3/uL (ref 4.0–10.5)
WBC: 6.2 10*3/uL (ref 4.0–10.5)

## 2015-12-25 LAB — POCT I-STAT 4, (NA,K, GLUC, HGB,HCT)
GLUCOSE: 126 mg/dL — AB (ref 65–99)
HEMATOCRIT: 23 % — AB (ref 39.0–52.0)
HEMOGLOBIN: 7.8 g/dL — AB (ref 13.0–17.0)
Potassium: 3.1 mmol/L — ABNORMAL LOW (ref 3.5–5.1)
Sodium: 143 mmol/L (ref 135–145)

## 2015-12-25 LAB — APTT: APTT: 34 s (ref 24–36)

## 2015-12-25 LAB — PROTIME-INR
INR: 1.52
PROTHROMBIN TIME: 18.4 s — AB (ref 11.4–15.2)

## 2015-12-25 LAB — HEMOGLOBIN AND HEMATOCRIT, BLOOD
HCT: 25.3 % — ABNORMAL LOW (ref 39.0–52.0)
Hemoglobin: 8.7 g/dL — ABNORMAL LOW (ref 13.0–17.0)

## 2015-12-25 LAB — PREPARE RBC (CROSSMATCH)

## 2015-12-25 LAB — PLATELET COUNT: Platelets: 163 10*3/uL (ref 150–400)

## 2015-12-25 LAB — CREATININE, SERUM
CREATININE: 1.05 mg/dL (ref 0.61–1.24)
GFR calc Af Amer: >60 mL/min (ref >60)

## 2015-12-25 LAB — MAGNESIUM: Magnesium: 2.4 mg/dL (ref 1.7–2.4)

## 2015-12-25 SURGERY — CORONARY ARTERY BYPASS GRAFTING (CABG)
Anesthesia: General | Site: Chest

## 2015-12-25 MED ORDER — LIDOCAINE 2% (20 MG/ML) 5 ML SYRINGE
INTRAMUSCULAR | Status: AC
  Filled 2015-12-25: qty 5

## 2015-12-25 MED ORDER — POTASSIUM CHLORIDE 10 MEQ/50ML IV SOLN
10.0000 meq | INTRAVENOUS | Status: AC
  Administered 2015-12-25 (×3): 10 meq via INTRAVENOUS

## 2015-12-25 MED ORDER — TAMSULOSIN HCL 0.4 MG PO CAPS
0.4000 mg | ORAL_CAPSULE | Freq: Every day | ORAL | Status: DC
  Administered 2015-12-26 – 2015-12-30 (×5): 0.4 mg via ORAL
  Filled 2015-12-25 (×5): qty 1

## 2015-12-25 MED ORDER — ACETAMINOPHEN 650 MG RE SUPP
650.0000 mg | Freq: Once | RECTAL | Status: AC
  Administered 2015-12-25: 650 mg via RECTAL

## 2015-12-25 MED ORDER — DEXTROSE 5 % IV SOLN
30.0000 ug/min | INTRAVENOUS | Status: DC
  Filled 2015-12-25 (×2): qty 2

## 2015-12-25 MED ORDER — SODIUM CHLORIDE 0.9 % IV SOLN: INTRAVENOUS | Status: DC

## 2015-12-25 MED ORDER — PROTAMINE SULFATE 10 MG/ML IV SOLN
INTRAVENOUS | Status: AC
  Filled 2015-12-25: qty 10

## 2015-12-25 MED ORDER — BISACODYL 5 MG PO TBEC
10.0000 mg | DELAYED_RELEASE_TABLET | Freq: Every day | ORAL | Status: DC
  Administered 2015-12-26 – 2015-12-29 (×4): 10 mg via ORAL
  Filled 2015-12-25 (×5): qty 2

## 2015-12-25 MED ORDER — PROPOFOL 10 MG/ML IV BOLUS
INTRAVENOUS | Status: DC | PRN
  Administered 2015-12-25: 20 mg via INTRAVENOUS

## 2015-12-25 MED ORDER — LACTATED RINGERS IV SOLN
500.0000 mL | Freq: Once | INTRAVENOUS | Status: DC | PRN
Start: 2015-12-25 — End: 2015-12-30

## 2015-12-25 MED ORDER — CHLORHEXIDINE GLUCONATE 0.12 % MT SOLN
15.0000 mL | OROMUCOSAL | Status: AC
  Administered 2015-12-25: 15 mL via OROMUCOSAL

## 2015-12-25 MED ORDER — HEMOSTATIC AGENTS (NO CHARGE) OPTIME
TOPICAL | Status: DC | PRN
  Administered 2015-12-25: 1 via TOPICAL

## 2015-12-25 MED ORDER — SODIUM CHLORIDE 0.9 % IV SOLN
INTRAVENOUS | Status: DC
  Administered 2015-12-25: 0.7 [IU]/h via INTRAVENOUS
  Filled 2015-12-25 (×2): qty 2.5

## 2015-12-25 MED ORDER — FAMOTIDINE IN NACL 20-0.9 MG/50ML-% IV SOLN
20.0000 mg | Freq: Two times a day (BID) | INTRAVENOUS | Status: AC
  Administered 2015-12-25: 20 mg via INTRAVENOUS

## 2015-12-25 MED ORDER — ONDANSETRON HCL 4 MG/2ML IJ SOLN
4.0000 mg | Freq: Four times a day (QID) | INTRAMUSCULAR | Status: DC | PRN
  Administered 2015-12-26: 4 mg via INTRAVENOUS
  Filled 2015-12-25: qty 2

## 2015-12-25 MED ORDER — PROTAMINE SULFATE 10 MG/ML IV SOLN
INTRAVENOUS | Status: DC | PRN
  Administered 2015-12-25: 350 mg via INTRAVENOUS

## 2015-12-25 MED ORDER — LACTATED RINGERS IV SOLN
INTRAVENOUS | Status: DC | PRN
  Administered 2015-12-25: 07:00:00 via INTRAVENOUS

## 2015-12-25 MED ORDER — ORAL CARE MOUTH RINSE
15.0000 mL | Freq: Four times a day (QID) | OROMUCOSAL | Status: DC
  Administered 2015-12-25 – 2015-12-26 (×3): 15 mL via OROMUCOSAL

## 2015-12-25 MED ORDER — HEPARIN SODIUM (PORCINE) 1000 UNIT/ML IJ SOLN
INTRAMUSCULAR | Status: AC
  Filled 2015-12-25: qty 1

## 2015-12-25 MED ORDER — SODIUM CHLORIDE 0.9 % IV SOLN: 250.0000 mL | INTRAVENOUS | Status: DC

## 2015-12-25 MED ORDER — FENTANYL CITRATE (PF) 250 MCG/5ML IJ SOLN
INTRAMUSCULAR | Status: AC
  Filled 2015-12-25: qty 5

## 2015-12-25 MED ORDER — NOREPINEPHRINE BITARTRATE 1 MG/ML IV SOLN
0.0000 ug/min | INTRAVENOUS | Status: DC
  Filled 2015-12-25: qty 4

## 2015-12-25 MED ORDER — FENTANYL CITRATE (PF) 250 MCG/5ML IJ SOLN
INTRAMUSCULAR | Status: AC
  Filled 2015-12-25: qty 20

## 2015-12-25 MED ORDER — ROCURONIUM BROMIDE 10 MG/ML (PF) SYRINGE
PREFILLED_SYRINGE | INTRAVENOUS | Status: AC
  Filled 2015-12-25: qty 10

## 2015-12-25 MED ORDER — FENTANYL CITRATE (PF) 250 MCG/5ML IJ SOLN
INTRAMUSCULAR | Status: DC | PRN
  Administered 2015-12-25: 100 ug via INTRAVENOUS
  Administered 2015-12-25: 500 ug via INTRAVENOUS
  Administered 2015-12-25: 100 ug via INTRAVENOUS
  Administered 2015-12-25 (×2): 50 ug via INTRAVENOUS
  Administered 2015-12-25 (×2): 100 ug via INTRAVENOUS

## 2015-12-25 MED ORDER — SODIUM CHLORIDE 0.9 % IV SOLN
0.5000 g/h | Freq: Once | INTRAVENOUS | Status: DC
  Filled 2015-12-25: qty 20

## 2015-12-25 MED ORDER — DEXTROSE 5 % IV SOLN
0.0000 ug/min | INTRAVENOUS | Status: DC
  Filled 2015-12-25: qty 4

## 2015-12-25 MED ORDER — METOPROLOL TARTRATE 25 MG/10 ML ORAL SUSPENSION
12.5000 mg | Freq: Two times a day (BID) | ORAL | Status: DC
Start: 2015-12-25 — End: 2015-12-26

## 2015-12-25 MED ORDER — EPHEDRINE 5 MG/ML INJ
INTRAVENOUS | Status: AC
  Filled 2015-12-25: qty 10

## 2015-12-25 MED ORDER — SODIUM BICARBONATE 8.4 % IV SOLN
50.0000 meq | Freq: Once | INTRAVENOUS | Status: AC
  Administered 2015-12-25: 50 meq via INTRAVENOUS

## 2015-12-25 MED ORDER — CARBAMAZEPINE ER 200 MG PO CP12: 300.0000 mg | ORAL_CAPSULE | Freq: Two times a day (BID) | ORAL | Status: DC

## 2015-12-25 MED ORDER — ASPIRIN 81 MG PO CHEW
324.0000 mg | CHEWABLE_TABLET | Freq: Every day | ORAL | Status: DC
  Filled 2015-12-25: qty 4

## 2015-12-25 MED ORDER — CHLORHEXIDINE GLUCONATE 0.12 % MT SOLN
15.0000 mL | Freq: Once | OROMUCOSAL | Status: AC
  Administered 2015-12-25: 15 mL via OROMUCOSAL
  Filled 2015-12-25: qty 15

## 2015-12-25 MED ORDER — OXYCODONE HCL 5 MG PO TABS
5.0000 mg | ORAL_TABLET | ORAL | Status: DC | PRN
  Administered 2015-12-25 – 2015-12-26 (×3): 5 mg via ORAL
  Administered 2015-12-26: 10 mg via ORAL
  Filled 2015-12-25: qty 1
  Filled 2015-12-25: qty 2
  Filled 2015-12-25 (×2): qty 1

## 2015-12-25 MED ORDER — FINASTERIDE 5 MG PO TABS
5.0000 mg | ORAL_TABLET | Freq: Every day | ORAL | Status: DC
  Administered 2015-12-26 – 2015-12-30 (×5): 5 mg via ORAL
  Filled 2015-12-25 (×5): qty 1

## 2015-12-25 MED ORDER — LIDOCAINE HCL (CARDIAC) 20 MG/ML IV SOLN
INTRAVENOUS | Status: DC | PRN
  Administered 2015-12-25: 60 mg via INTRAVENOUS

## 2015-12-25 MED ORDER — PANTOPRAZOLE SODIUM 40 MG PO TBEC
40.0000 mg | DELAYED_RELEASE_TABLET | Freq: Every day | ORAL | Status: DC
  Administered 2015-12-27 – 2015-12-30 (×4): 40 mg via ORAL
  Filled 2015-12-25 (×4): qty 1

## 2015-12-25 MED ORDER — PHENYLEPHRINE HCL 10 MG/ML IJ SOLN
0.0000 ug/min | INTRAVENOUS | Status: DC
  Administered 2015-12-25: 35 ug/min via INTRAVENOUS
  Filled 2015-12-25 (×3): qty 2

## 2015-12-25 MED ORDER — ALBUMIN HUMAN 5 % IV SOLN
250.0000 mL | INTRAVENOUS | Status: AC | PRN
  Administered 2015-12-25 – 2015-12-26 (×4): 250 mL via INTRAVENOUS
  Filled 2015-12-25: qty 250

## 2015-12-25 MED ORDER — SODIUM CHLORIDE 0.9% FLUSH
3.0000 mL | Freq: Two times a day (BID) | INTRAVENOUS | Status: DC
  Administered 2015-12-26 – 2015-12-27 (×3): 3 mL via INTRAVENOUS

## 2015-12-25 MED ORDER — TOPIRAMATE 25 MG PO TABS
100.0000 mg | ORAL_TABLET | Freq: Every day | ORAL | Status: DC
  Administered 2015-12-25 – 2015-12-30 (×6): 100 mg via ORAL
  Filled 2015-12-25: qty 4
  Filled 2015-12-25: qty 1
  Filled 2015-12-25: qty 4
  Filled 2015-12-25 (×3): qty 1

## 2015-12-25 MED ORDER — METOPROLOL TARTRATE 5 MG/5ML IV SOLN: 2.5000 mg | INTRAVENOUS | Status: DC | PRN

## 2015-12-25 MED ORDER — PROTAMINE SULFATE 10 MG/ML IV SOLN
INTRAVENOUS | Status: AC
  Filled 2015-12-25: qty 25

## 2015-12-25 MED ORDER — CALCIUM CHLORIDE 10 % IV SOLN
1.0000 g | Freq: Once | INTRAVENOUS | Status: AC
  Administered 2015-12-25: 1 g via INTRAVENOUS

## 2015-12-25 MED ORDER — MIDAZOLAM HCL 5 MG/5ML IJ SOLN
INTRAMUSCULAR | Status: DC | PRN
  Administered 2015-12-25: 3 mg via INTRAVENOUS
  Administered 2015-12-25: 2 mg via INTRAVENOUS

## 2015-12-25 MED ORDER — LACTATED RINGERS IV SOLN: INTRAVENOUS | Status: DC

## 2015-12-25 MED ORDER — ACETAMINOPHEN 160 MG/5ML PO SOLN: 1000.0000 mg | Freq: Four times a day (QID) | ORAL | Status: DC

## 2015-12-25 MED ORDER — ROCURONIUM BROMIDE 10 MG/ML (PF) SYRINGE
PREFILLED_SYRINGE | INTRAVENOUS | Status: AC
  Filled 2015-12-25: qty 20

## 2015-12-25 MED ORDER — SODIUM CHLORIDE 0.9% FLUSH: 3.0000 mL | INTRAVENOUS | Status: DC | PRN

## 2015-12-25 MED ORDER — SODIUM CHLORIDE 0.45 % IV SOLN
INTRAVENOUS | Status: DC | PRN
  Administered 2015-12-25: 14:00:00 via INTRAVENOUS

## 2015-12-25 MED ORDER — 0.9 % SODIUM CHLORIDE (POUR BTL) OPTIME
TOPICAL | Status: DC | PRN
  Administered 2015-12-25: 6000 mL

## 2015-12-25 MED ORDER — HEPARIN SODIUM (PORCINE) 1000 UNIT/ML IJ SOLN
INTRAMUSCULAR | Status: DC | PRN
  Administered 2015-12-25: 33000 [IU] via INTRAVENOUS
  Administered 2015-12-25: 2000 [IU] via INTRAVENOUS

## 2015-12-25 MED ORDER — MIDAZOLAM HCL 2 MG/2ML IJ SOLN
2.0000 mg | INTRAMUSCULAR | Status: DC | PRN
Start: 2015-12-25 — End: 2015-12-26

## 2015-12-25 MED ORDER — ROCURONIUM BROMIDE 100 MG/10ML IV SOLN: INTRAVENOUS | Status: DC | PRN

## 2015-12-25 MED ORDER — ASPIRIN EC 325 MG PO TBEC
325.0000 mg | DELAYED_RELEASE_TABLET | Freq: Every day | ORAL | Status: DC
  Administered 2015-12-26 – 2015-12-30 (×5): 325 mg via ORAL
  Filled 2015-12-25 (×5): qty 1

## 2015-12-25 MED ORDER — NITROGLYCERIN IN D5W 200-5 MCG/ML-% IV SOLN: 0.0000 ug/min | INTRAVENOUS | Status: DC

## 2015-12-25 MED ORDER — VANCOMYCIN HCL IN DEXTROSE 1-5 GM/200ML-% IV SOLN
1000.0000 mg | Freq: Once | INTRAVENOUS | Status: AC
  Administered 2015-12-25: 1000 mg via INTRAVENOUS
  Filled 2015-12-25: qty 200

## 2015-12-25 MED ORDER — TRAMADOL HCL 50 MG PO TABS: 50.0000 mg | ORAL_TABLET | ORAL | Status: DC | PRN

## 2015-12-25 MED ORDER — DOXEPIN HCL 10 MG PO CAPS
50.0000 mg | ORAL_CAPSULE | Freq: Every day | ORAL | Status: DC
  Administered 2015-12-26 – 2015-12-29 (×4): 50 mg via ORAL
  Filled 2015-12-25 (×3): qty 1
  Filled 2015-12-25 (×2): qty 5

## 2015-12-25 MED ORDER — ACETAMINOPHEN 160 MG/5ML PO SOLN
650.0000 mg | Freq: Once | ORAL | Status: AC
Start: 2015-12-25 — End: 2015-12-25

## 2015-12-25 MED ORDER — DEXMEDETOMIDINE HCL IN NACL 200 MCG/50ML IV SOLN
0.0000 ug/kg/h | INTRAVENOUS | Status: DC
  Filled 2015-12-25: qty 50

## 2015-12-25 MED ORDER — METOPROLOL TARTRATE 12.5 MG HALF TABLET: 12.5000 mg | ORAL_TABLET | Freq: Once | ORAL | Status: DC

## 2015-12-25 MED ORDER — DOCUSATE SODIUM 100 MG PO CAPS
200.0000 mg | ORAL_CAPSULE | Freq: Every day | ORAL | Status: DC
Start: 2015-12-26 — End: 2015-12-27
  Administered 2015-12-26 – 2015-12-27 (×2): 200 mg via ORAL
  Filled 2015-12-25 (×2): qty 2

## 2015-12-25 MED ORDER — BISACODYL 10 MG RE SUPP: 10.0000 mg | Freq: Every day | RECTAL | Status: DC

## 2015-12-25 MED ORDER — SODIUM CHLORIDE 0.9 % IJ SOLN
OROMUCOSAL | Status: DC | PRN
  Administered 2015-12-25 (×3): 4 mL via TOPICAL

## 2015-12-25 MED ORDER — POTASSIUM CHLORIDE 10 MEQ/50ML IV SOLN
10.0000 meq | INTRAVENOUS | Status: AC
  Administered 2015-12-25 (×2): 10 meq via INTRAVENOUS

## 2015-12-25 MED ORDER — FENTANYL CITRATE (PF) 100 MCG/2ML IJ SOLN
50.0000 ug | INTRAMUSCULAR | Status: DC | PRN
  Administered 2015-12-25 – 2015-12-26 (×2): 50 ug via INTRAVENOUS
  Filled 2015-12-25 (×2): qty 2

## 2015-12-25 MED ORDER — CARBAMAZEPINE ER 300 MG PO CP12: 300.0000 mg | ORAL_CAPSULE | Freq: Two times a day (BID) | ORAL | Status: DC

## 2015-12-25 MED ORDER — EPHEDRINE SULFATE 50 MG/ML IJ SOLN
INTRAMUSCULAR | Status: DC | PRN
  Administered 2015-12-25 (×2): 5 mg via INTRAVENOUS

## 2015-12-25 MED ORDER — METOPROLOL TARTRATE 12.5 MG HALF TABLET: 12.5000 mg | ORAL_TABLET | Freq: Two times a day (BID) | ORAL | Status: DC

## 2015-12-25 MED ORDER — ALBUMIN HUMAN 5 % IV SOLN
INTRAVENOUS | Status: DC | PRN
  Administered 2015-12-25 (×2): via INTRAVENOUS

## 2015-12-25 MED ORDER — MAGNESIUM SULFATE 4 GM/100ML IV SOLN
4.0000 g | Freq: Once | INTRAVENOUS | Status: AC
  Administered 2015-12-25: 4 g via INTRAVENOUS
  Filled 2015-12-25: qty 100

## 2015-12-25 MED ORDER — PHENYLEPHRINE 40 MCG/ML (10ML) SYRINGE FOR IV PUSH (FOR BLOOD PRESSURE SUPPORT)
PREFILLED_SYRINGE | INTRAVENOUS | Status: AC
  Filled 2015-12-25: qty 10

## 2015-12-25 MED ORDER — DEXTROSE 5 % IV SOLN
1.5000 g | Freq: Two times a day (BID) | INTRAVENOUS | Status: AC
  Administered 2015-12-25 – 2015-12-27 (×4): 1.5 g via INTRAVENOUS
  Filled 2015-12-25 (×4): qty 1.5

## 2015-12-25 MED ORDER — CHLORHEXIDINE GLUCONATE 4 % EX LIQD: 30.0000 mL | CUTANEOUS | Status: DC

## 2015-12-25 MED ORDER — ACETAMINOPHEN 500 MG PO TABS
1000.0000 mg | ORAL_TABLET | Freq: Four times a day (QID) | ORAL | Status: DC
  Administered 2015-12-25 – 2015-12-30 (×17): 1000 mg via ORAL
  Filled 2015-12-25 (×18): qty 2

## 2015-12-25 MED ORDER — PROPOFOL 10 MG/ML IV BOLUS
INTRAVENOUS | Status: AC
  Filled 2015-12-25: qty 20

## 2015-12-25 MED ORDER — CHLORHEXIDINE GLUCONATE 0.12% ORAL RINSE (MEDLINE KIT)
15.0000 mL | Freq: Two times a day (BID) | OROMUCOSAL | Status: DC
  Administered 2015-12-25 – 2015-12-26 (×2): 15 mL via OROMUCOSAL

## 2015-12-25 MED ORDER — ORAL CARE MOUTH RINSE
15.0000 mL | Freq: Two times a day (BID) | OROMUCOSAL | Status: DC
  Administered 2015-12-25 – 2015-12-27 (×5): 15 mL via OROMUCOSAL

## 2015-12-25 MED ORDER — GABAPENTIN 600 MG PO TABS
300.0000 mg | ORAL_TABLET | Freq: Three times a day (TID) | ORAL | Status: DC
  Administered 2015-12-26 – 2015-12-30 (×13): 300 mg via ORAL
  Filled 2015-12-25 (×13): qty 1

## 2015-12-25 MED ORDER — ROCURONIUM BROMIDE 10 MG/ML (PF) SYRINGE
PREFILLED_SYRINGE | INTRAVENOUS | Status: DC | PRN
  Administered 2015-12-25 (×5): 50 mg via INTRAVENOUS

## 2015-12-25 MED ORDER — PHENYLEPHRINE HCL 10 MG/ML IJ SOLN
INTRAMUSCULAR | Status: DC | PRN
  Administered 2015-12-25 (×3): 80 ug via INTRAVENOUS

## 2015-12-25 MED ORDER — INSULIN REGULAR BOLUS VIA INFUSION
0.0000 [IU] | Freq: Three times a day (TID) | INTRAVENOUS | Status: DC
  Filled 2015-12-25: qty 10

## 2015-12-25 MED ORDER — MIDAZOLAM HCL 10 MG/2ML IJ SOLN
INTRAMUSCULAR | Status: AC
  Filled 2015-12-25: qty 2

## 2015-12-25 MED FILL — Mannitol IV Soln 20%: INTRAVENOUS | Qty: 500 | Status: AC

## 2015-12-25 MED FILL — Heparin Sodium (Porcine) Inj 1000 Unit/ML: INTRAMUSCULAR | Qty: 10 | Status: AC

## 2015-12-25 MED FILL — Lidocaine HCl IV Inj 20 MG/ML: INTRAVENOUS | Qty: 5 | Status: AC

## 2015-12-25 MED FILL — Magnesium Sulfate Inj 50%: INTRAMUSCULAR | Qty: 10 | Status: AC

## 2015-12-25 MED FILL — Sodium Chloride IV Soln 0.9%: INTRAVENOUS | Qty: 2000 | Status: AC

## 2015-12-25 MED FILL — Heparin Sodium (Porcine) Inj 1000 Unit/ML: INTRAMUSCULAR | Qty: 30 | Status: AC

## 2015-12-25 MED FILL — Electrolyte-R (PH 7.4) Solution: INTRAVENOUS | Qty: 4000 | Status: AC

## 2015-12-25 MED FILL — Potassium Chloride Inj 2 mEq/ML: INTRAVENOUS | Qty: 40 | Status: AC

## 2015-12-25 MED FILL — Sodium Bicarbonate IV Soln 8.4%: INTRAVENOUS | Qty: 50 | Status: AC

## 2015-12-25 SURGICAL SUPPLY — 90 items
BAG DECANTER FOR FLEXI CONT (MISCELLANEOUS) ×4 IMPLANT
BANDAGE ACE 4X5 VEL STRL LF (GAUZE/BANDAGES/DRESSINGS) ×4 IMPLANT
BANDAGE ACE 6X5 VEL STRL LF (GAUZE/BANDAGES/DRESSINGS) ×4 IMPLANT
BANDAGE ELASTIC 4 VELCRO ST LF (GAUZE/BANDAGES/DRESSINGS) ×2 IMPLANT
BANDAGE ELASTIC 6 VELCRO ST LF (GAUZE/BANDAGES/DRESSINGS) ×2 IMPLANT
BASKET HEART  (ORDER IN 25'S) (MISCELLANEOUS) ×1
BASKET HEART (ORDER IN 25'S) (MISCELLANEOUS) ×1
BASKET HEART (ORDER IN 25S) (MISCELLANEOUS) ×2 IMPLANT
BLADE STERNUM SYSTEM 6 (BLADE) ×4 IMPLANT
BNDG GAUZE ELAST 4 BULKY (GAUZE/BANDAGES/DRESSINGS) ×4 IMPLANT
CANISTER SUCTION 2500CC (MISCELLANEOUS) ×4 IMPLANT
CANNULA EZ GLIDE AORTIC 21FR (CANNULA) ×4 IMPLANT
CATH CPB KIT HENDRICKSON (MISCELLANEOUS) ×4 IMPLANT
CATH ROBINSON RED A/P 18FR (CATHETERS) ×6 IMPLANT
CATH THORACIC 36FR (CATHETERS) ×4 IMPLANT
CATH THORACIC 36FR RT ANG (CATHETERS) ×4 IMPLANT
CLIP FOGARTY SPRING 6M (CLIP) ×2 IMPLANT
CLIP TI MEDIUM 24 (CLIP) IMPLANT
CLIP TI WIDE RED SMALL 24 (CLIP) ×6 IMPLANT
CRADLE DONUT ADULT HEAD (MISCELLANEOUS) ×4 IMPLANT
DRAPE CARDIOVASCULAR INCISE (DRAPES) ×4
DRAPE SLUSH/WARMER DISC (DRAPES) ×4 IMPLANT
DRAPE SRG 135X102X78XABS (DRAPES) ×2 IMPLANT
DRSG AQUACEL AG ADV 3.5X14 (GAUZE/BANDAGES/DRESSINGS) ×2 IMPLANT
DRSG COVADERM 4X14 (GAUZE/BANDAGES/DRESSINGS) ×2 IMPLANT
ELECT REM PT RETURN 9FT ADLT (ELECTROSURGICAL) ×8
ELECTRODE REM PT RTRN 9FT ADLT (ELECTROSURGICAL) ×4 IMPLANT
FELT TEFLON 1X6 (MISCELLANEOUS) ×6 IMPLANT
GAUZE SPONGE 4X4 12PLY STRL (GAUZE/BANDAGES/DRESSINGS) ×8 IMPLANT
GLOVE BIO SURGEON STRL SZ 6 (GLOVE) ×6 IMPLANT
GLOVE SURG SIGNA 7.5 PF LTX (GLOVE) ×12 IMPLANT
GOWN STRL REUS W/ TWL LRG LVL3 (GOWN DISPOSABLE) ×8 IMPLANT
GOWN STRL REUS W/ TWL XL LVL3 (GOWN DISPOSABLE) ×4 IMPLANT
GOWN STRL REUS W/TWL LRG LVL3 (GOWN DISPOSABLE) ×20
GOWN STRL REUS W/TWL XL LVL3 (GOWN DISPOSABLE) ×8
HEMOSTAT POWDER SURGIFOAM 1G (HEMOSTASIS) ×12 IMPLANT
HEMOSTAT SURGICEL 2X14 (HEMOSTASIS) ×4 IMPLANT
INSERT FOGARTY XLG (MISCELLANEOUS) IMPLANT
KIT BASIN OR (CUSTOM PROCEDURE TRAY) ×4 IMPLANT
KIT ROOM TURNOVER OR (KITS) ×4 IMPLANT
KIT SUCTION CATH 14FR (SUCTIONS) ×8 IMPLANT
KIT VASOVIEW HEMOPRO VH 3000 (KITS) ×4 IMPLANT
MARKER GRAFT CORONARY BYPASS (MISCELLANEOUS) ×12 IMPLANT
NS IRRIG 1000ML POUR BTL (IV SOLUTION) ×22 IMPLANT
PACK OPEN HEART (CUSTOM PROCEDURE TRAY) ×4 IMPLANT
PAD ARMBOARD 7.5X6 YLW CONV (MISCELLANEOUS) ×8 IMPLANT
PAD ELECT DEFIB RADIOL ZOLL (MISCELLANEOUS) ×4 IMPLANT
PENCIL BUTTON HOLSTER BLD 10FT (ELECTRODE) ×4 IMPLANT
PUNCH AORTIC ROTATE  4.5MM 8IN (MISCELLANEOUS) ×2 IMPLANT
PUNCH AORTIC ROTATE 4.0MM (MISCELLANEOUS) IMPLANT
PUNCH AORTIC ROTATE 4.5MM 8IN (MISCELLANEOUS) IMPLANT
PUNCH AORTIC ROTATE 5MM 8IN (MISCELLANEOUS) IMPLANT
SET CARDIOPLEGIA MPS 5001102 (MISCELLANEOUS) ×2 IMPLANT
SPONGE GAUZE 4X4 12PLY STER LF (GAUZE/BANDAGES/DRESSINGS) ×4 IMPLANT
SPONGE LAP 18X18 X RAY DECT (DISPOSABLE) ×2 IMPLANT
SUT BONE WAX W31G (SUTURE) ×4 IMPLANT
SUT MNCRL AB 4-0 PS2 18 (SUTURE) IMPLANT
SUT PROLENE 3 0 SH DA (SUTURE) ×4 IMPLANT
SUT PROLENE 4 0 RB 1 (SUTURE)
SUT PROLENE 4 0 SH DA (SUTURE) IMPLANT
SUT PROLENE 4-0 RB1 .5 CRCL 36 (SUTURE) IMPLANT
SUT PROLENE 6 0 C 1 30 (SUTURE) ×12 IMPLANT
SUT PROLENE 7 0 BV 1 (SUTURE) ×6 IMPLANT
SUT PROLENE 7 0 BV1 MDA (SUTURE) ×6 IMPLANT
SUT PROLENE 8 0 BV175 6 (SUTURE) ×6 IMPLANT
SUT SILK  1 MH (SUTURE) ×2
SUT SILK 1 MH (SUTURE) IMPLANT
SUT STEEL 6MS V (SUTURE) ×4 IMPLANT
SUT STEEL STERNAL CCS#1 18IN (SUTURE) IMPLANT
SUT STEEL SZ 6 DBL 3X14 BALL (SUTURE) ×4 IMPLANT
SUT VIC AB 1 CTX 36 (SUTURE) ×8
SUT VIC AB 1 CTX36XBRD ANBCTR (SUTURE) ×4 IMPLANT
SUT VIC AB 2-0 CT1 27 (SUTURE) ×4
SUT VIC AB 2-0 CT1 TAPERPNT 27 (SUTURE) IMPLANT
SUT VIC AB 2-0 CTX 27 (SUTURE) IMPLANT
SUT VIC AB 3-0 SH 27 (SUTURE)
SUT VIC AB 3-0 SH 27X BRD (SUTURE) IMPLANT
SUT VIC AB 3-0 X1 27 (SUTURE) ×2 IMPLANT
SUT VICRYL 4-0 PS2 18IN ABS (SUTURE) IMPLANT
SUTURE E-PAK OPEN HEART (SUTURE) ×4 IMPLANT
SYSTEM SAHARA CHEST DRAIN ATS (WOUND CARE) ×4 IMPLANT
TAPE CLOTH SURG 4X10 WHT LF (GAUZE/BANDAGES/DRESSINGS) ×2 IMPLANT
TAPE PAPER 2X10 WHT MICROPORE (GAUZE/BANDAGES/DRESSINGS) ×2 IMPLANT
TOWEL OR 17X24 6PK STRL BLUE (TOWEL DISPOSABLE) ×8 IMPLANT
TOWEL OR 17X26 10 PK STRL BLUE (TOWEL DISPOSABLE) ×8 IMPLANT
TRAY FOLEY IC TEMP SENS 16FR (CATHETERS) ×4 IMPLANT
TUBE FEEDING 8FR 16IN STR KANG (MISCELLANEOUS) ×4 IMPLANT
TUBING INSUFFLATION (TUBING) ×4 IMPLANT
UNDERPAD 30X30 (UNDERPADS AND DIAPERS) ×4 IMPLANT
WATER STERILE IRR 1000ML POUR (IV SOLUTION) ×8 IMPLANT

## 2015-12-25 NOTE — OR Nursing (Signed)
1228 First call made to SICU RN.

## 2015-12-25 NOTE — Progress Notes (Signed)
Echocardiogram Echocardiogram Transesophageal has been performed.  Roger Pruitt 12/25/2015, 8:43 AM

## 2015-12-25 NOTE — Anesthesia Postprocedure Evaluation (Signed)
Anesthesia Post Note  Patient: Roger Pruitt  Procedure(s) Performed: Procedure(s) (LRB): CORONARY ARTERY BYPASS GRAFTING (CABG) x4  , using left internal mammary artery and right leg greater saphenous vein harvested endoscopically LIMA-LAD SVG-OM2 SVG-DIAG SVG-PD (N/A) TRANSESOPHAGEAL ECHOCARDIOGRAM (TEE) (N/A)  Patient location during evaluation: SICU Anesthesia Type: General Level of consciousness: patient remains intubated per anesthesia plan Pain management: pain level controlled Vital Signs Assessment: post-procedure vital signs reviewed and stable Respiratory status: patient on ventilator - see flowsheet for VS and patient remains intubated per anesthesia plan Cardiovascular status: stable Postop Assessment: no signs of nausea or vomiting Anesthetic complications: no    Last Vitals:  Vitals:   12/25/15 1645 12/25/15 1700  BP:  91/61  Pulse: 81 82  Resp: 14 12  Temp: 36.8 C 36.9 C    Last Pain:  Vitals:   12/25/15 0648  TempSrc: Oral                 Railee Bonillas,E. Kenika Sahm

## 2015-12-25 NOTE — Progress Notes (Signed)
      [address]       [place] [address]             [phone]      S/p CABG x 4  Extubated, neuro intact  BP (!) 87/60 Comment: Simultaneous filing. User may not have seen previous data.  Pulse 88   Temp 99.3 F (37.4 C)   Resp 15   SpO2 98%   40/23 CI= 2.8  Intake/Output Summary (Last 24 hours) at 12/25/15 1931 Last data filed at 12/25/15 1900  Gross per 24 hour  Intake           6480.3 ml  Output             3325 ml  Net           3155.3 ml   ABG post extubation 7.34/42/79/24  Doing well early postop  Remo Lipps C. Roxan Hockey, MD [facility] [phone]

## 2015-12-25 NOTE — Interval H&P Note (Signed)
History and Physical Interval Note:  12/25/2015 7:21 AM  Roger Pruitt  has presented today for surgery, with the diagnosis of CAD  The various methods of treatment have been discussed with the patient and family. After consideration of risks, benefits and other options for treatment, the patient has consented to  Procedure(s): CORONARY ARTERY BYPASS GRAFTING (CABG) (N/A) TRANSESOPHAGEAL ECHOCARDIOGRAM (TEE) (N/A) as a surgical intervention .  The patient's history has been reviewed, patient examined, no change in status, stable for surgery.  I have reviewed the patient's chart and labs.  Questions were answered to the patient's satisfaction.     Melrose Nakayama
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
## 2015-12-25 NOTE — Brief Op Note (Addendum)
12/25/2015  11:38 AM  PATIENT:  Nena Polio  65 y.o. male :     [address]       [place],[address] [address]             [phone]     12/25/2015  11:40 AM  PATIENT:  Nena Polio  65 y.o. male  PRE-OPERATIVE DIAGNOSIS: 3 VESSEL CAD  POST-OPERATIVE DIAGNOSIS:  3 VESSEL CAD  PROCEDURE:  Procedure(s): CORONARY ARTERY BYPASS GRAFTING (CABG) x 4 TRANSESOPHAGEAL ECHOCARDIOGRAM (TEE)  LIMA-LAD  SVG-OM 2  SVG-DIAGONAL 1  SVG-PDA Endoscopic greater saphenous vein harvest RIGHT leg SURGEON:  Surgeon(s): Melrose Nakayama, MD  PHYSICIAN ASSISTANT: WAYNE GOLD PA-C  ANESTHESIA:   general  PATIENT CONDITION:  ICU - intubated and hemodynamically stable.  PRE-OPERATIVE WEIGHT: [id number]  COMPLICATIONS: NO KNOWN  XC= 66 min CPB= 128 min  FINDINGS Diffusely diseased coronaries.  Good quality conduits.

## 2015-12-25 NOTE — Procedures (Signed)
Extubation Procedure Note  Patient Details:   Name: Roger Pruitt DOB: 05-26-1950 MRN: [id number]   Airway Documentation:     Evaluation  O2 sats: stable throughout Complications: No apparent complications Patient did tolerate procedure well. Bilateral Breath Sounds: Clear, Diminished   Yes   Pt tolerated wean, positive for cuff leak, extubated to [facility]. No dyspnea or stridor noted after extubation. RT will continue to monitor.   Mariam Dollar 12/25/2015, 7:16 PM

## 2015-12-25 NOTE — OR Nursing (Signed)
[place] call made to SICU RN.

## 2015-12-25 NOTE — Anesthesia Procedure Notes (Signed)
Procedures

## 2015-12-25 NOTE — Anesthesia Procedure Notes (Addendum)
Procedure Name: Intubation Date/Time: 12/25/2015 7:53 AM Performed by: Tressia Miners LEFFEW Pre-anesthesia Checklist: Patient identified, Patient being monitored, Timeout performed, Emergency Drugs available and Suction available Patient Re-evaluated:Patient Re-evaluated prior to inductionOxygen Delivery Method: Circle System Utilized Preoxygenation: Pre-oxygenation with 100% oxygen Intubation Type: IV induction Ventilation: Two handed mask ventilation required and Oral airway inserted - appropriate to patient size Laryngoscope Size: Mac and 3 Grade View: Grade I Tube type: Oral Tube size: 8.0 mm Number of attempts: 1 Airway Equipment and Method: Stylet Placement Confirmation: ETT inserted through vocal cords under direct vision,  positive ETCO2 and breath sounds checked- equal and bilateral Secured at: 24 cm Tube secured with: Tape Dental Injury: Teeth and Oropharynx as per pre-operative assessment

## 2015-12-25 NOTE — Transfer of Care (Signed)
Immediate Anesthesia Transfer of Care Note  Patient: Roger Pruitt  Procedure(s) Performed: Procedure(s): CORONARY ARTERY BYPASS GRAFTING (CABG) x4  , using left internal mammary artery and right leg greater saphenous vein harvested endoscopically LIMA-LAD SVG-OM2 SVG-DIAG SVG-PD (N/A) TRANSESOPHAGEAL ECHOCARDIOGRAM (TEE) (N/A)  Patient Location: SICU  Anesthesia Type:General  Level of Consciousness: Patient remains intubated per anesthesia plan  Airway & Oxygen Therapy: Patient remains intubated per anesthesia plan  Post-op Assessment: Report given to RN and Post -op Vital signs reviewed and stable  Post vital signs: Reviewed and stable  Last Vitals:  Vitals:   12/25/15 0724 12/25/15 0725  BP:    Pulse: 65 66  Resp: 15 17  Temp:      Last Pain:  Vitals:   12/25/15 0648  TempSrc: Oral      Patients Stated Pain Goal: 3 ([id number])  Complications: No apparent anesthesia complications

## 2015-12-25 NOTE — Progress Notes (Signed)
MD Roxan Hockey gave verbal order to follow non-diabetic insulin protocol for patient. Will monitor patient's blood sugar q1hour until patient has met protocol to turn insulin drip off.  Levon Hedger, RN

## 2015-12-25 NOTE — OR Nursing (Signed)
[place] call made to SICU RN.

## 2015-12-26 ENCOUNTER — Inpatient Hospital Stay (HOSPITAL_COMMUNITY): Payer: Medicare HMO

## 2015-12-26 LAB — POCT I-STAT 4, (NA,K, GLUC, HGB,HCT)
Glucose, Bld: 129 mg/dL — ABNORMAL HIGH (ref 65–99)
HEMATOCRIT: 27 % — AB (ref 39.0–52.0)
HEMOGLOBIN: 9.2 g/dL — AB (ref 13.0–17.0)
Potassium: 3.8 mmol/L (ref 3.5–5.1)
SODIUM: 140 mmol/L (ref 135–145)

## 2015-12-26 LAB — POCT I-STAT, CHEM 8
BUN: 17 mg/dL (ref 6–20)
CALCIUM ION: 1.2 mmol/L (ref 1.15–1.40)
Chloride: 98 mmol/L — ABNORMAL LOW (ref 101–111)
Creatinine, Ser: 1 mg/dL (ref 0.61–1.24)
Glucose, Bld: 123 mg/dL — ABNORMAL HIGH (ref 65–99)
HCT: 27 % — ABNORMAL LOW (ref 39.0–52.0)
Hemoglobin: 9.2 g/dL — ABNORMAL LOW (ref 13.0–17.0)
Potassium: 3.1 mmol/L — ABNORMAL LOW (ref 3.5–5.1)
SODIUM: 140 mmol/L (ref 135–145)
TCO2: 25 mmol/L (ref 0–100)

## 2015-12-26 LAB — CREATININE, SERUM: CREATININE: 0.95 mg/dL (ref 0.61–1.24)

## 2015-12-26 LAB — BASIC METABOLIC PANEL
ANION GAP: 6 (ref 5–15)
BUN: 16 mg/dL (ref 6–20)
CHLORIDE: 107 mmol/L (ref 101–111)
CO2: 25 mmol/L (ref 22–32)
CREATININE: 0.99 mg/dL (ref 0.61–1.24)
Calcium: 8.1 mg/dL — ABNORMAL LOW (ref 8.9–10.3)
GFR calc non Af Amer: >60 mL/min (ref >60)
Glucose, Bld: 108 mg/dL — ABNORMAL HIGH (ref 65–99)
Potassium: 3.4 mmol/L — ABNORMAL LOW (ref 3.5–5.1)
SODIUM: 138 mmol/L (ref 135–145)

## 2015-12-26 LAB — CBC
HEMATOCRIT: 26.4 % — AB (ref 39.0–52.0)
HEMATOCRIT: 25.8 % — AB (ref 39.0–52.0)
HEMOGLOBIN: 8.9 g/dL — AB (ref 13.0–17.0)
HEMOGLOBIN: 8.6 g/dL — AB (ref 13.0–17.0)
MCH: 30.8 pg (ref 26.0–34.0)
MCH: 30.6 pg (ref 26.0–34.0)
MCHC: 33.7 g/dL (ref 30.0–36.0)
MCHC: 33.3 g/dL (ref 30.0–36.0)
MCV: 91.3 fL (ref 78.0–100.0)
MCV: 91.8 fL (ref 78.0–100.0)
Platelets: 146 10*3/uL — ABNORMAL LOW (ref 150–400)
Platelets: 125 10*3/uL — ABNORMAL LOW (ref 150–400)
RBC: 2.89 MIL/uL — AB (ref 4.22–5.81)
RBC: 2.81 MIL/uL — ABNORMAL LOW (ref 4.22–5.81)
RDW: 12.9 % (ref 11.5–15.5)
RDW: 13 % (ref 11.5–15.5)
WBC: 7 10*3/uL (ref 4.0–10.5)
WBC: 7.8 10*3/uL (ref 4.0–10.5)

## 2015-12-26 LAB — MAGNESIUM
MAGNESIUM: 2 mg/dL (ref 1.7–2.4)
MAGNESIUM: 1.8 mg/dL (ref 1.7–2.4)

## 2015-12-26 LAB — GLUCOSE, CAPILLARY
GLUCOSE-CAPILLARY: 112 mg/dL — AB (ref 65–99)
GLUCOSE-CAPILLARY: 117 mg/dL — AB (ref 65–99)
GLUCOSE-CAPILLARY: 106 mg/dL — AB (ref 65–99)
GLUCOSE-CAPILLARY: 106 mg/dL — AB (ref 65–99)
GLUCOSE-CAPILLARY: 102 mg/dL — AB (ref 65–99)
GLUCOSE-CAPILLARY: 104 mg/dL — AB (ref 65–99)
GLUCOSE-CAPILLARY: 119 mg/dL — AB (ref 65–99)
GLUCOSE-CAPILLARY: 135 mg/dL — AB (ref 65–99)
Glucose-Capillary: 112 mg/dL — ABNORMAL HIGH (ref 65–99)
Glucose-Capillary: 114 mg/dL — ABNORMAL HIGH (ref 65–99)
Glucose-Capillary: 112 mg/dL — ABNORMAL HIGH (ref 65–99)
Glucose-Capillary: 110 mg/dL — ABNORMAL HIGH (ref 65–99)
Glucose-Capillary: 108 mg/dL — ABNORMAL HIGH (ref 65–99)
Glucose-Capillary: 116 mg/dL — ABNORMAL HIGH (ref 65–99)
Glucose-Capillary: 137 mg/dL — ABNORMAL HIGH (ref 65–99)

## 2015-12-26 MED ORDER — ATORVASTATIN CALCIUM 40 MG PO TABS
40.0000 mg | ORAL_TABLET | Freq: Every day | ORAL | Status: DC
  Administered 2015-12-26 – 2015-12-29 (×4): 40 mg via ORAL
  Filled 2015-12-26 (×4): qty 1

## 2015-12-26 MED ORDER — INSULIN DETEMIR 100 UNIT/ML ~~LOC~~ SOLN
25.0000 [IU] | Freq: Two times a day (BID) | SUBCUTANEOUS | Status: DC
  Administered 2015-12-26 – 2015-12-27 (×3): 25 [IU] via SUBCUTANEOUS
  Filled 2015-12-26 (×4): qty 0.25

## 2015-12-26 MED ORDER — INSULIN ASPART 100 UNIT/ML ~~LOC~~ SOLN
0.0000 [IU] | SUBCUTANEOUS | Status: DC
  Administered 2015-12-26 – 2015-12-27 (×3): 2 [IU] via SUBCUTANEOUS

## 2015-12-26 MED ORDER — CARVEDILOL 6.25 MG PO TABS
6.2500 mg | ORAL_TABLET | Freq: Two times a day (BID) | ORAL | Status: DC
  Administered 2015-12-26 – 2015-12-30 (×9): 6.25 mg via ORAL
  Filled 2015-12-26 (×9): qty 1

## 2015-12-26 MED ORDER — FUROSEMIDE 10 MG/ML IJ SOLN
40.0000 mg | Freq: Once | INTRAMUSCULAR | Status: AC
  Administered 2015-12-26: 40 mg via INTRAVENOUS
  Filled 2015-12-26: qty 4

## 2015-12-26 MED ORDER — BUPROPION HCL ER (XL) 300 MG PO TB24
300.0000 mg | ORAL_TABLET | Freq: Every day | ORAL | Status: DC
  Administered 2015-12-26 – 2015-12-30 (×5): 300 mg via ORAL
  Filled 2015-12-26 (×2): qty 1
  Filled 2015-12-26 (×3): qty 2

## 2015-12-26 MED ORDER — LINAGLIPTIN 5 MG PO TABS
5.0000 mg | ORAL_TABLET | Freq: Every day | ORAL | Status: DC
  Administered 2015-12-26 – 2015-12-30 (×5): 5 mg via ORAL
  Filled 2015-12-26 (×5): qty 1

## 2015-12-26 MED ORDER — POTASSIUM CHLORIDE 10 MEQ/50ML IV SOLN
10.0000 meq | INTRAVENOUS | Status: AC
  Administered 2015-12-26 (×3): 10 meq via INTRAVENOUS
  Filled 2015-12-26 (×2): qty 50

## 2015-12-26 MED ORDER — POTASSIUM CHLORIDE 10 MEQ/50ML IV SOLN
10.0000 meq | INTRAVENOUS | Status: AC
Start: 2015-12-26 — End: 2015-12-26
  Administered 2015-12-26: 10 meq via INTRAVENOUS
  Filled 2015-12-26 (×2): qty 50

## 2015-12-26 MED ORDER — CHLORTHALIDONE 25 MG PO TABS
25.0000 mg | ORAL_TABLET | Freq: Every day | ORAL | Status: DC
  Administered 2015-12-26 – 2015-12-30 (×5): 25 mg via ORAL
  Filled 2015-12-26 (×5): qty 1

## 2015-12-26 MED ORDER — POTASSIUM CHLORIDE 10 MEQ/50ML IV SOLN
10.0000 meq | INTRAVENOUS | Status: AC
Start: 2015-12-26 — End: 2015-12-26
  Administered 2015-12-26 (×2): 10 meq via INTRAVENOUS
  Filled 2015-12-26 (×2): qty 50

## 2015-12-26 MED ORDER — INSULIN DETEMIR 100 UNIT/ML ~~LOC~~ SOLN: 25.0000 [IU] | Freq: Two times a day (BID) | SUBCUTANEOUS | Status: DC

## 2015-12-26 MED ORDER — PALIPERIDONE ER 6 MG PO TB24
6.0000 mg | ORAL_TABLET | Freq: Every day | ORAL | Status: DC
Start: 2015-12-26 — End: 2015-12-30
  Administered 2015-12-26 – 2015-12-29 (×4): 6 mg via ORAL
  Filled 2015-12-26 (×6): qty 1

## 2015-12-26 NOTE — Op Note (Signed)
NAMEMarland Pruitt  VALLEY, MENELEY NO.:  [id number]  MEDICAL RECORD NO.:  [id number]  LOCATION:  2S11C                        FACILITY:  [facility]  PHYSICIAN:  Revonda Standard. Roxan Hockey, M.D.DATE OF BIRTH:  07-21-1950  DATE OF PROCEDURE:  12/25/2015 DATE OF DISCHARGE:                              OPERATIVE REPORT   PREOPERATIVE DIAGNOSIS:  Three-vessel coronary artery disease with exertional angina.  POSTOPERATIVE DIAGNOSIS:  Three-vessel coronary artery disease with exertional angina.  PROCEDURE:  Median sternotomy, extracorporeal circulation, Coronary artery bypass grafting x 4  Left internal mammary artery to left anterior descending,  Saphenous vein graft to first diagonal,  Saphenous vein graft to obtuse marginal 2,  Saphenous vein graft to posterior descending), Endoscopic vein harvest right leg.  SURGEON:  Revonda Standard. Roxan Hockey, M.D.  ASSISTANT:  John Giovanni, P.A.-C.  ANESTHESIA:  General.  FINDINGS:  Transesophageal echocardiography showed preserved left ventricular contractility. There was mild mitral regurgitation.  No significant valvular pathology.  Good quality conduits.  Diffusely diseased and calcified coronary arteries. Poor candidate for redo grafting.  CLINICAL NOTE:  Mr. Zaitz is a 65 year old gentleman with multiple medical issues including known coronary artery disease with prior MI and stents.  He presented with shortness of breath and chest tightness with exertion.  A stress test was positive and at catheterization he had three-vessel coronary artery disease.  He was referred for coronary artery bypass grafting.  The indications, risks, benefits, and alternatives were discussed in detail with the patient.  He understood and accepted the risks and agreed to proceed.  OPERATIVE NOTE:  Mr. Escobedo was brought to the preoperative holding area on December 25, 2015.  Dr. Glennon Mac of the Anesthesia Service placed a Swan-Ganz catheter and an arterial  blood pressure monitoring line.  He was taken to the operating room, anesthetized, and intubated.  A Foley catheter was placed.  Intravenous antibiotics were administered. Transesophageal echocardiography was performed.  Findings as previously noted.  The chest, abdomen, and legs were prepped and draped in usual sterile fashion.  A median sternotomy was performed and the left internal mammary artery was harvested using standard technique. Simultaneously, incision was made in the medial aspect of the right leg just below the knee.  The greater saphenous vein was harvested from mid calf to groin.  The saphenous vein was of good quality as was the mammary artery. 2000 units of heparin was administered during the vessel harvest.  The remainder of the full heparin dose was given prior to opening the pericardium.  After harvesting the conduits, the pericardium was opened.  The ascending aorta was inspected.  There was no evidence of atherosclerotic disease.  After confirming adequate anticoagulation with ACT measurement, the aorta was cannulated via concentric 2-0 Ethibond pledgeted pursestring sutures.  A dual-stage venous cannula was placed via a pursestring suture in the right atrial appendage.  Cardiopulmonary bypass was initiated.  Flows were maintained per protocol.  The patient was cooled to 32 degrees Celsius.  The coronary arteries were inspected and anastomotic sites were chosen.  The conduits were inspected and cut to length.  A foam pad was placed in the pericardium to insulate the heart.  A temperature probe was placed in myocardial septum and a cardioplegia cannula was placed in the ascending aorta.  The aorta was crossclamped.  The left ventricle was emptied via the aortic root vent.  Cardiac arrest then was achieved with combination of cold antegrade blood cardioplegia and topical iced saline.  After achieving a complete diastolic arrest and septal cooling to 11  degrees Celsius with 1.5 L of cardioplegia, the following distal anastomoses were performed.  First, a reversed saphenous vein graft was placed end to side to the posterior descending branch of the right coronary.  This graft was placed very distally due to diffuse disease in the posterior descending.  It was good quality at the site of the anastomosis and a 1.5-mm probe did pass distally.  An end-to-side anastomosis was performed with a running 7-0 Prolene suture.  All anastomoses were probed proximally and distally at their completion to ensure patency before tying the suture. Cardioplegia was administered down to each vein graft to assess flow and Hemostasis, both were good.  Next, a reversed saphenous vein graft was placed end-to-side to the first diagonal branch to the LAD.  This was a large dominant anterolateral branch.  It was diffusely diseased.  It was a poor quality vessel.  The vein was of good quality.  An end-to-side anastomosis was performed with a running 7-0 Prolene suture.  With cardioplegia administration, there was better than expected flow and good hemostasis.  Next, a reversed saphenous vein graft was placed end-to-side to obtuse marginal 2. This was a distal circumflex branch.  It was a 1.5 mm target again of poor quality due to diffuse disease.  The vein was anastomosed end-to-side with a running 7-0 Prolene suture.  Again, there was good flow and good hemostasis.  Additional cardioplegia was administered down the aortic root.  The left internal mammary artery was brought through a window in the pericardium. The distal end was beveled.  It was anastomosed end-to-side to the distal LAD.  The LAD was diffusely diseased vessel, but a 1.5-mm probe did pass distally to the apex.  Overall, the LAD was a poor quality vessel.  The end-to-side anastomosis was performed with a running 8-0 Prolene suture.  At the completion of the mammary to LAD anastomosis, the bulldog  clamp was briefly removed to inspect for hemostasis.  Rapid septal rewarming was noted.  The bulldog clamp was replaced.  The mammary pedicle was tacked to the epicardial surface of the heart with 6- 0 Prolene sutures.  Rewarming was begun.  Additional cardioplegia was administered.  The vein grafts were cut to length.  The proximal vein graft anastomoses for the posterior descending and diagonal grafts were performed to 4.5 mm punch aortotomies with running 6-0 Prolene sutures.  Due to the relatively short length of the graft to the circumflex it was placed end-to- side to the diagonal vein graft to a longitudinal venotomy.  This anastomosis was done with a running 7-0 Prolene suture.  There was a bifurcation in the OM vein graft and both limbs were left patent.  This was just distal to the proximal anastomosis.  After completing the final proximal anastomosis, the patient was placed in Trendelenburg position. Lidocaine was administered.  The bulldog clamp was removed from the left mammary artery.  The aortic root was de-aired and the aortic crossclamp was removed.  The patient spontaneously resumed sinus rhythm and did not require defibrillation.  All proximal and distal anastomoses were inspected for hemostasis. Epicardial pacing wires were  placed on the right ventricle and right atrium and atrial pacing at 80 beats per minute was initiated.  When the patient had rewarmed to a core temperature of 37 degrees Celsius, he was weaned from cardiopulmonary bypass on the first attempt without difficulty.  He did not require inotropic support.  Total bypass time was 128 minutes.  The initial cardiac index was greater than 2 L/minute/meter squared and the patient remained hemodynamically stable throughout the post bypass.  He was requiring relatively high doses of Neo-Synephrine due to peripheral vasodilatation.  A test dose of protamine was administered and was well tolerated.  The atrial  and aortic cannulae were removed.  The remainder of the protamine was administered without incident.  The chest was irrigated with warm saline.  Hemostasis was achieved.  Left pleural and mediastinal chest tubes were placed in separate subcostal incisions.  The pericardium was not closed.  The sternum was closed with a combination of single and double heavy gauge stainless steel wires.  The pectoralis fascia, subcutaneous tissue, and skin were closed in standard fashion.  All sponge, needle, and instrument counts were correct at the end of the procedure.  The patient was taken from the operating room to the Surgical Intensive Care Unit intubated and in good condition.  The patient is a poor candidate for redo grafting.     Revonda Standard Roxan Hockey, M.D.     SCH/MEDQ  D:  12/25/2015  T:  12/26/2015  Job:  [id number]

## 2015-12-26 NOTE — Progress Notes (Signed)
1 Day Post-Op Procedure(s) (LRB): CORONARY ARTERY BYPASS GRAFTING (CABG) x4  , using left internal mammary artery and right leg greater saphenous vein harvested endoscopically LIMA-LAD SVG-OM2 SVG-DIAG SVG-PD (N/A) TRANSESOPHAGEAL ECHOCARDIOGRAM (TEE) (N/A) Subjective: C/o incisional pain  Objective: Vital signs in last 24 hours: Temp:  [96.6 F (35.9 C)-99.9 F (37.7 C)] 99 F (37.2 C) (09/30 0900) Pulse Rate:  [73-94] 74 (09/30 0900) Cardiac Rhythm: Normal sinus rhythm (09/30 0800) Resp:  [10-29] 18 (09/30 0900) BP: (80-137)/(45-86) 108/69 (09/30 0900) SpO2:  [92 %-100 %] 100 % (09/30 0900) Arterial Line BP: (80-155)/(45-81) 124/56 (09/30 0900) FiO2 (%):  [40 %-50 %] 40 % (09/29 1715) Weight:  [229 lb 8 oz (104.1 kg)] 229 lb 8 oz (104.1 kg) (09/30 0459)  Hemodynamic parameters for last 24 hours: PAP: (25-40)/(11-26) 28/15 CO:  [4.1 L/min-7.8 L/min] 7.4 L/min CI:  [1.8 L/min/m2-3.4 L/min/m2] 3.3 L/min/m2  Intake/Output from previous day: 09/29 0701 - 09/30 0700 In: 8367.4 [P.O.:200; I.V.:5661.4; Blood:306; IV Piggyback:2200] Out: [id number] [Urine:3465; Blood:850; Chest Tube:1120] Intake/Output this shift: Total I/O In: 138.6 [I.V.:88.6; IV Piggyback:50] Out: 250 [Urine:220; Chest Tube:30]  General appearance: alert, cooperative and no distress Neurologic: intact Heart: RRR, faint rub Lungs: diminished breath sounds bibasilar Abdomen: normal findings: soft, non-tender  Lab Results:  Recent Labs  12/25/15 2009 12/26/15 0400  WBC 6.2 7.0  HGB 8.9* 8.9*  HCT 26.6* 26.4*  PLT 138* 146*   BMET:  Recent Labs  12/25/15 2004 12/25/15 2009 12/26/15 0400  NA 139  --  138  K 5.4*  --  3.4*  CL 104  --  107  CO2  --   --  25  GLUCOSE 146*  --  108*  BUN 24*  --  16  CREATININE 1.00 1.05 0.99  CALCIUM  --   --  8.1*    PT/INR:  Recent Labs  12/25/15 1358  LABPROT 18.4*  INR 1.52   ABG    Component Value Date/Time   PHART 7.340 (L) 12/25/2015 1921   HCO3  22.6 12/25/2015 1921   TCO2 25 12/25/2015 2004   ACIDBASEDEF 3.0 (H) 12/25/2015 1921   O2SAT 95.0 12/25/2015 1921   CBG (last 3)   Recent Labs  12/26/15 0556 12/26/15 0657 12/26/15 0803  GLUCAP 106* 108* 106*    Assessment/Plan: S/P Procedure(s) (LRB): CORONARY ARTERY BYPASS GRAFTING (CABG) x4  , using left internal mammary artery and right leg greater saphenous vein harvested endoscopically LIMA-LAD SVG-OM2 SVG-DIAG SVG-PD (N/A) TRANSESOPHAGEAL ECHOCARDIOGRAM (TEE) (N/A) -  CV- good cardiac index- dc swan and A line  Restart coreg. Continue ASA, add lipitor  RESP_ IS for atelectasis  RENAL- creatinine OK. Hypokalemia- supplement  ENDO_ CBG well controlled on drip. Restart PO meds, trnasition to levemir + SSI  Anemia secondary to ABL- follow  DC mediastinal CT, keep pleural tube in place  mobilize   LOS: 1 day    Roger Pruitt 12/26/2015

## 2015-12-26 NOTE — Progress Notes (Signed)
      [address]       [place] [address]             [phone]      No complaints, denies pain  BP 101/69   Pulse 80   Temp 98.8 F (37.1 C)   Resp 15   Wt 229 lb 8 oz (104.1 kg)   SpO2 93%   BMI 28.69 kg/m    Intake/Output Summary (Last 24 hours) at 12/26/15 1857 Last data filed at 12/26/15 1800  Gross per 24 hour  Intake          2790.58 ml  Output             5195 ml  Net         -2404.42 ml    K= 3.1- will give 5 runs Drainage from CT decreasing- will leave in overnight and dc in AM  Roger Pruitt C. Roxan Hockey, MD [facility] [phone]

## 2015-12-26 NOTE — Progress Notes (Signed)
Dangle/Stand pt at 1200, chest tube output of 200 ml.  Dangle/Stand pt at 1400, chest tube output of 250 ml Both output from right mediastinal tube.  MD Roxan Hockey paged and order to leave chest tube in at this time.

## 2015-12-27 ENCOUNTER — Encounter (HOSPITAL_COMMUNITY): Payer: Self-pay | Admitting: *Deleted

## 2015-12-27 ENCOUNTER — Inpatient Hospital Stay (HOSPITAL_COMMUNITY): Payer: Medicare HMO

## 2015-12-27 LAB — GLUCOSE, CAPILLARY
GLUCOSE-CAPILLARY: 155 mg/dL — AB (ref 65–99)
GLUCOSE-CAPILLARY: 98 mg/dL (ref 65–99)
Glucose-Capillary: 82 mg/dL (ref 65–99)
Glucose-Capillary: 128 mg/dL — ABNORMAL HIGH (ref 65–99)
Glucose-Capillary: 112 mg/dL — ABNORMAL HIGH (ref 65–99)

## 2015-12-27 LAB — BASIC METABOLIC PANEL
ANION GAP: 5 (ref 5–15)
BUN: 17 mg/dL (ref 6–20)
CALCIUM: 8.3 mg/dL — AB (ref 8.9–10.3)
CHLORIDE: 106 mmol/L (ref 101–111)
CO2: 28 mmol/L (ref 22–32)
Creatinine, Ser: 0.97 mg/dL (ref 0.61–1.24)
GFR calc non Af Amer: >60 mL/min (ref >60)
Glucose, Bld: 82 mg/dL (ref 65–99)
POTASSIUM: 3.2 mmol/L — AB (ref 3.5–5.1)
Sodium: 139 mmol/L (ref 135–145)

## 2015-12-27 LAB — CBC
HCT: 23.3 % — ABNORMAL LOW (ref 39.0–52.0)
HEMOGLOBIN: 7.8 g/dL — AB (ref 13.0–17.0)
MCH: 30.7 pg (ref 26.0–34.0)
MCHC: 33.5 g/dL (ref 30.0–36.0)
MCV: 91.7 fL (ref 78.0–100.0)
Platelets: 120 10*3/uL — ABNORMAL LOW (ref 150–400)
RBC: 2.54 MIL/uL — AB (ref 4.22–5.81)
RDW: 13.2 % (ref 11.5–15.5)
WBC: 7.2 10*3/uL (ref 4.0–10.5)

## 2015-12-27 MED ORDER — LORAZEPAM 1 MG PO TABS: 1.0000 mg | ORAL_TABLET | Freq: Two times a day (BID) | ORAL | Status: DC | PRN

## 2015-12-27 MED ORDER — POTASSIUM CHLORIDE 10 MEQ/50ML IV SOLN
10.0000 meq | INTRAVENOUS | Status: AC
  Administered 2015-12-27 (×3): 10 meq via INTRAVENOUS
  Filled 2015-12-27 (×2): qty 50

## 2015-12-27 MED ORDER — POTASSIUM CHLORIDE CRYS ER 20 MEQ PO TBCR
20.0000 meq | EXTENDED_RELEASE_TABLET | Freq: Two times a day (BID) | ORAL | Status: DC
  Administered 2015-12-28: 20 meq via ORAL
  Filled 2015-12-27: qty 1

## 2015-12-27 MED ORDER — LORAZEPAM 1 MG PO TABS: 1.0000 mg | ORAL_TABLET | Freq: Two times a day (BID) | ORAL | Status: DC

## 2015-12-27 MED ORDER — INSULIN ASPART 100 UNIT/ML ~~LOC~~ SOLN: 0.0000 [IU] | Freq: Every day | SUBCUTANEOUS | Status: DC

## 2015-12-27 MED ORDER — POTASSIUM CHLORIDE 10 MEQ/50ML IV SOLN
10.0000 meq | INTRAVENOUS | Status: DC
  Filled 2015-12-27: qty 50

## 2015-12-27 MED ORDER — INSULIN ASPART 100 UNIT/ML ~~LOC~~ SOLN
0.0000 [IU] | Freq: Three times a day (TID) | SUBCUTANEOUS | Status: DC
  Administered 2015-12-27: 4 [IU] via SUBCUTANEOUS
  Administered 2015-12-29 – 2015-12-30 (×2): 3 [IU] via SUBCUTANEOUS
  Administered 2015-12-30: 4 [IU] via SUBCUTANEOUS

## 2015-12-27 MED ORDER — SODIUM CHLORIDE 0.9% FLUSH: 3.0000 mL | INTRAVENOUS | Status: DC | PRN

## 2015-12-27 MED ORDER — SODIUM CHLORIDE 0.9 % IV SOLN: 250.0000 mL | INTRAVENOUS | Status: DC | PRN

## 2015-12-27 MED ORDER — MOVING RIGHT ALONG BOOK
Freq: Once | Status: AC
  Administered 2015-12-27: 18:00:00
  Filled 2015-12-27: qty 1

## 2015-12-27 MED ORDER — FLUTICASONE PROPIONATE 50 MCG/ACT NA SUSP
1.0000 | Freq: Every day | NASAL | Status: DC
  Administered 2015-12-27 – 2015-12-30 (×4): 1 via NASAL
  Filled 2015-12-27 (×2): qty 16

## 2015-12-27 MED ORDER — SODIUM CHLORIDE 0.9% FLUSH
3.0000 mL | Freq: Two times a day (BID) | INTRAVENOUS | Status: DC
  Administered 2015-12-27 – 2015-12-29 (×5): 3 mL via INTRAVENOUS

## 2015-12-27 MED ORDER — MAGNESIUM OXIDE 400 (241.3 MG) MG PO TABS
400.0000 mg | ORAL_TABLET | Freq: Two times a day (BID) | ORAL | Status: DC
  Administered 2015-12-27 – 2015-12-30 (×7): 400 mg via ORAL
  Filled 2015-12-27 (×8): qty 1

## 2015-12-27 MED ORDER — CLOPIDOGREL BISULFATE 75 MG PO TABS
75.0000 mg | ORAL_TABLET | Freq: Every day | ORAL | Status: DC
  Administered 2015-12-28 – 2015-12-30 (×3): 75 mg via ORAL
  Filled 2015-12-27 (×3): qty 1

## 2015-12-27 MED ORDER — MAGNESIUM HYDROXIDE 400 MG/5ML PO SUSP: 30.0000 mL | Freq: Every day | ORAL | Status: DC | PRN

## 2015-12-27 MED ORDER — METFORMIN HCL 500 MG PO TABS
1000.0000 mg | ORAL_TABLET | Freq: Two times a day (BID) | ORAL | Status: DC
  Administered 2015-12-27 – 2015-12-30 (×6): 1000 mg via ORAL
  Filled 2015-12-27 (×6): qty 2

## 2015-12-27 MED ORDER — METFORMIN HCL 500 MG PO TABS: 1000.0000 mg | ORAL_TABLET | Freq: Two times a day (BID) | ORAL | Status: DC

## 2015-12-27 MED ORDER — GUAIFENESIN-DM 100-10 MG/5ML PO SYRP
5.0000 mL | ORAL_SOLUTION | ORAL | Status: DC | PRN
  Administered 2015-12-27 – 2015-12-29 (×2): 5 mL via ORAL
  Filled 2015-12-27 (×2): qty 5

## 2015-12-27 MED ORDER — POTASSIUM CHLORIDE 10 MEQ/50ML IV SOLN
10.0000 meq | INTRAVENOUS | Status: AC
  Administered 2015-12-27 (×2): 10 meq via INTRAVENOUS
  Filled 2015-12-27 (×2): qty 50

## 2015-12-27 MED ORDER — ALUM & MAG HYDROXIDE-SIMETH 200-200-20 MG/5ML PO SUSP: 15.0000 mL | ORAL | Status: DC | PRN

## 2015-12-27 NOTE — Progress Notes (Signed)
2 Days Post-Op Procedure(s) (LRB): CORONARY ARTERY BYPASS GRAFTING (CABG) x4  , using left internal mammary artery and right leg greater saphenous vein harvested endoscopically LIMA-LAD SVG-OM2 SVG-DIAG SVG-PD (N/A) TRANSESOPHAGEAL ECHOCARDIOGRAM (TEE) (N/A) Subjective: C/o cough, otherwise feels well  Objective: Vital signs in last 24 hours: Temp:  [97.7 F (36.5 C)-98.8 F (37.1 C)] 98.2 F (36.8 C) (10/01 0843) Pulse Rate:  [74-88] 88 (10/01 0900) Cardiac Rhythm: Normal sinus rhythm (10/01 0900) Resp:  [13-27] 22 (10/01 0900) BP: (97-125)/(46-80) 97/46 (10/01 0900) SpO2:  [93 %-99 %] 98 % (10/01 0900) Arterial Line BP: (139)/(64) 139/64 (09/30 1100) Weight:  [224 lb 6.9 oz (101.8 kg)] 224 lb 6.9 oz (101.8 kg) (10/01 0600)  Hemodynamic parameters for last 24 hours: PAP: (31)/(16) 31/16  Intake/Output from previous day: 09/30 0701 - 10/01 0700 In: 1980.7 [P.O.:720; I.V.:710.7; IV Piggyback:550] Out: [id number] [Urine:3640; Chest Tube:740] Intake/Output this shift: Total I/O In: 20 [I.V.:20] Out: -   General appearance: alert and cooperative Neurologic: intact Heart: regular rate and rhythm Lungs: diminished breath sounds bibasilar Abdomen: normal findings: soft, non-tender  Lab Results:  Recent Labs  12/26/15 1701 12/27/15 0435  WBC 7.8 7.2  HGB 8.6* 7.8*  HCT 25.8* 23.3*  PLT 125* 120*   BMET:  Recent Labs  12/26/15 0400  12/26/15 1646 12/26/15 1701 12/27/15 0435  NA 138  < > 140  --  139  K 3.4*  < > 3.1*  --  3.2*  CL 107  --  98*  --  106  CO2 25  --   --   --  28  GLUCOSE 108*  < > 123*  --  82  BUN 16  --  17  --  17  CREATININE 0.99  --  1.00 0.95 0.97  CALCIUM 8.1*  --   --   --  8.3*  < > = values in this interval not displayed.  PT/INR:  Recent Labs  12/25/15 1358  LABPROT 18.4*  INR 1.52   ABG    Component Value Date/Time   PHART 7.340 (L) 12/25/2015 1921   HCO3 22.6 12/25/2015 1921   TCO2 25 12/26/2015 1646   ACIDBASEDEF 3.0 (H)  12/25/2015 1921   O2SAT 95.0 12/25/2015 1921   CBG (last 3)   Recent Labs  12/26/15 2327 12/27/15 0358 12/27/15 0838  GLUCAP 137* 82 128*    Assessment/Plan: S/P Procedure(s) (LRB): CORONARY ARTERY BYPASS GRAFTING (CABG) x4  , using left internal mammary artery and right leg greater saphenous vein harvested endoscopically LIMA-LAD SVG-OM2 SVG-DIAG SVG-PD (N/A) TRANSESOPHAGEAL ECHOCARDIOGRAM (TEE) (N/A) -  CV- stable in SR  ASA, coreg, statin  Resume lisinopril prior to dc if BP allows  Resume plavix tomorrow  RESP- CXR OK, some congestion-Robitussin PRN  RENAL- creatinine Ok  Hypokalemia- supplement  ENDO- CBG well controlled, dc levemir restart metformin  DC chest tubes  Anemia- secondary to ABL, Hct down slightly, follow  DVT prophylaxis- SCD  Transfer to [place]    LOS: 2 days    Melrose Nakayama 12/27/2015

## 2015-12-27 NOTE — Progress Notes (Signed)
      [address]       [place] [address]             [phone]      No complaints  BP 110/71 (BP Location: Right Arm)   Pulse 84   Temp 98.4 F (36.9 C) (Oral)   Resp (!) 28   Wt 224 lb 6.9 oz (101.8 kg)   SpO2 96%   BMI 28.05 kg/m    Intake/Output Summary (Last 24 hours) at 12/27/15 2016 Last data filed at 12/27/15 1900  Gross per 24 hour  Intake             1680 ml  Output             2906 ml  Net            -1226 ml    Waiting for bed on [place]. Roxan Hockey, MD [facility] [phone]

## 2015-12-28 ENCOUNTER — Inpatient Hospital Stay (HOSPITAL_COMMUNITY): Payer: Medicare HMO

## 2015-12-28 LAB — GLUCOSE, CAPILLARY
GLUCOSE-CAPILLARY: 120 mg/dL — AB (ref 65–99)
GLUCOSE-CAPILLARY: 126 mg/dL — AB (ref 65–99)
Glucose-Capillary: 106 mg/dL — ABNORMAL HIGH (ref 65–99)
Glucose-Capillary: 98 mg/dL (ref 65–99)

## 2015-12-28 LAB — BASIC METABOLIC PANEL
Anion gap: 6 (ref 5–15)
BUN: 17 mg/dL (ref 6–20)
CALCIUM: 8.6 mg/dL — AB (ref 8.9–10.3)
CO2: 28 mmol/L (ref 22–32)
CREATININE: 1.08 mg/dL (ref 0.61–1.24)
Chloride: 104 mmol/L (ref 101–111)
GFR calc Af Amer: >60 mL/min (ref >60)
GFR calc non Af Amer: >60 mL/min (ref >60)
GLUCOSE: 108 mg/dL — AB (ref 65–99)
Potassium: 3.2 mmol/L — ABNORMAL LOW (ref 3.5–5.1)
Sodium: 138 mmol/L (ref 135–145)

## 2015-12-28 LAB — CBC
HEMATOCRIT: 24.4 % — AB (ref 39.0–52.0)
Hemoglobin: 8.2 g/dL — ABNORMAL LOW (ref 13.0–17.0)
MCH: 30.8 pg (ref 26.0–34.0)
MCHC: 33.6 g/dL (ref 30.0–36.0)
MCV: 91.7 fL (ref 78.0–100.0)
Platelets: 143 10*3/uL — ABNORMAL LOW (ref 150–400)
RBC: 2.66 MIL/uL — ABNORMAL LOW (ref 4.22–5.81)
RDW: 13.6 % (ref 11.5–15.5)
WBC: 7.8 10*3/uL (ref 4.0–10.5)

## 2015-12-28 MED ORDER — POTASSIUM CHLORIDE CRYS ER 20 MEQ PO TBCR
40.0000 meq | EXTENDED_RELEASE_TABLET | Freq: Two times a day (BID) | ORAL | Status: DC
  Administered 2015-12-28: 40 meq via ORAL
  Administered 2015-12-28: 20 meq via ORAL
  Administered 2015-12-29 – 2015-12-30 (×3): 40 meq via ORAL
  Filled 2015-12-28 (×5): qty 2

## 2015-12-28 NOTE — Care Management Note (Signed)
Case Management Note  Patient Details  Name: Roger Pruitt MRN: [id number] Date of Birth: February 22, 1951  Subjective/Objective:    S/p CABG                Action/Plan:  PTA independent home from wife.  Pt has aid that comes in 5 days a week (2.5 hour a day for pt and 2.5 hours for wife - to help with cleaning , both pt and wife are independent with ADLs - per pt.  Pt has walker and cane already in the home.  Wife / medicaid paid aide will be with pt for the recommended supervision period.  CM will continue to follow for discharge needs   Expected Discharge Date:                  Expected Discharge Plan:  Home/Self Care  In-House Referral:     Discharge planning Services  CM Consult  Post Acute Care Choice:    Choice offered to:     DME Arranged:    DME Agency:     HH Arranged:    HH Agency:     Status of Service:  In process, will continue to follow  If discussed at Long Length of Stay Meetings, dates discussed:    Additional Comments:  Maryclare Labrador, RN 12/28/2015, 11:17 AM

## 2015-12-28 NOTE — Care Management Important Message (Signed)
Important Message  Patient Details  Name: Roger Pruitt MRN: [id number] Date of Birth: [birth year]   Medicare Important Message Given:  Yes    Nathen May 12/28/2015, 12:18 PM

## 2015-12-28 NOTE — Progress Notes (Signed)
3 Days Post-Op Procedure(s) (LRB): CORONARY ARTERY BYPASS GRAFTING (CABG) x4  , using left internal mammary artery and right leg greater saphenous vein harvested endoscopically LIMA-LAD SVG-OM2 SVG-DIAG SVG-PD (N/A) TRANSESOPHAGEAL ECHOCARDIOGRAM (TEE) (N/A) Subjective: No complaints this AM  Objective: Vital signs in last 24 hours: Temp:  [97.4 F (36.3 C)-98.5 F (36.9 C)] 98.5 F (36.9 C) (10/02 0624) Pulse Rate:  [80-89] 85 (10/02 0624) Cardiac Rhythm: Normal sinus rhythm (10/01 2000) Resp:  [15-28] 18 (10/02 0500) BP: (91-129)/(46-71) 112/66 (10/02 0624) SpO2:  [94 %-100 %] 100 % (10/02 0500) Weight:  [225 lb 8.5 oz (102.3 kg)] 225 lb 8.5 oz (102.3 kg) (10/02 [id number])  Hemodynamic parameters for last 24 hours:    Intake/Output from previous day: 10/01 0701 - 10/02 0700 In: 1160 [P.O.:1010; I.V.:100; IV Piggyback:50] Out: [id number] [Urine:3150; Stool:1; Chest Tube:50] Intake/Output this shift: No intake/output data recorded.  General appearance: alert, cooperative and no distress Neurologic: intact Heart: regular rate and rhythm Lungs: clear to auscultation bilaterally Abdomen: normal findings: soft, non-tender  Lab Results:  Recent Labs  12/27/15 0435 12/28/15 0614  WBC 7.2 7.8  HGB 7.8* 8.2*  HCT 23.3* 24.4*  PLT 120* 143*   BMET:  Recent Labs  12/27/15 0435 12/28/15 0614  NA 139 138  K 3.2* 3.2*  CL 106 104  CO2 28 28  GLUCOSE 82 108*  BUN 17 17  CREATININE 0.97 1.08  CALCIUM 8.3* 8.6*    PT/INR:  Recent Labs  12/25/15 1358  LABPROT 18.4*  INR 1.52   ABG    Component Value Date/Time   PHART 7.340 (L) 12/25/2015 1921   HCO3 22.6 12/25/2015 1921   TCO2 25 12/26/2015 1646   ACIDBASEDEF 3.0 (H) 12/25/2015 1921   O2SAT 95.0 12/25/2015 1921   CBG (last 3)   Recent Labs  12/27/15 1234 12/27/15 1634 12/27/15 2132  GLUCAP 155* 98 112*    Assessment/Plan: S/P Procedure(s) (LRB): CORONARY ARTERY BYPASS GRAFTING (CABG) x4  , using left  internal mammary artery and right leg greater saphenous vein harvested endoscopically LIMA-LAD SVG-OM2 SVG-DIAG SVG-PD (N/A) TRANSESOPHAGEAL ECHOCARDIOGRAM (TEE) (N/A) -Doing well overall  Awaiting bed on 2 west Needs to increase ambulation Hypokalemia persists- increase KDur to 40 BID Weight stable CBG well controlled   LOS: 3 days    Roger Pruitt 12/28/2015

## 2015-12-28 NOTE — Plan of Care (Signed)
Problem: Cardiac: Goal: Hemodynamic stability will improve Outcome: Completed/Met Date Met: 12/28/15 Pt maintaining stable vitals and hemodynamics off all drips with increase of activity.   Problem: Respiratory: Goal: Respiratory status will improve Outcome: Completed/Met Date Met: 12/28/15 Pt maintain high O2 sats. on room air and independently performs I.S. and C/DB exercises.

## 2015-12-28 NOTE — Evaluation (Signed)
Physical Therapy Evaluation Patient Details Name: Roger Pruitt MRN: [id number] DOB: 09/10/1950 Today's Date: 12/28/2015   History of Present Illness  65 yo with CAD s/p CABGx 4. PMHx: CAD, MI, HTN, HLD, DM, bipolar, arthritis, Parkinsons, hyponatremia  Clinical Impression  Pt very pleasant in chair on arrival. Pt unaware of sternal precautions, educated for all with handout provided. Pt overall moving well with decreased activity tolerance and knowledge of precautions who will benefit from acute therapy to maximize mobility, gait and independence to decrease burden of care prior to D/C with probably transition to cardiac rehab for D/C. Pt encourage to walk daily with nursing. Pt does have history of 3 falls which he reports are related to hyponatremia and will benefit from further balance assessment.   HR 88-95 BP 108/62 before 118/76 after sats 99% on RA    Follow Up Recommendations No PT follow up    Equipment Recommendations  None recommended by PT    Recommendations for Other Services       Precautions / Restrictions Precautions Precautions: Fall;Sternal      Mobility  Bed Mobility Overal bed mobility: Needs Assistance Bed Mobility: Rolling;Sit to Sidelying Rolling: Min guard       Sit to sidelying: Min guard General bed mobility comments: cues for hand placement and sequence  Transfers Overall transfer level: Needs assistance   Transfers: Sit to/from Stand Sit to Stand: Supervision         General transfer comment: cues for hand placement and safety  Ambulation/Gait Ambulation/Gait assistance: Min guard Ambulation Distance (Feet): 300 Feet Assistive device: Rolling walker (2 wheeled) Gait Pattern/deviations: Step-through pattern;Trunk flexed   Gait velocity interpretation: Below normal speed for age/gender General Gait Details: cues for posture and position in RW  Stairs            Wheelchair Mobility    Modified Rankin (Stroke Patients  Only)       Balance Overall balance assessment: History of Falls                                           Pertinent Vitals/Pain Pain Assessment: No/denies pain    Home Living Family/patient expects to be discharged to:: Private residence Living Arrangements: Spouse/significant other Available Help at Discharge: Family;Available 24 hours/day Type of Home: Mobile home Home Access: Ramped entrance     Home Layout: One level Home Equipment: [place] - 2 wheels;Cane - quad;Bedside commode      Prior Function Level of Independence: Independent with assistive device(s)         Comments: pt states he occasionally uses cane. Pt reports 3 falls in the last year related to hyponatremia     Hand Dominance        Extremity/Trunk Assessment   Upper Extremity Assessment: Overall WFL for tasks assessed           Lower Extremity Assessment: Overall WFL for tasks assessed      Cervical / Trunk Assessment: Normal  Communication   Communication: No difficulties  Cognition Arousal/Alertness: Awake/alert Behavior During Therapy: WFL for tasks assessed/performed Overall Cognitive Status: Within Functional Limits for tasks assessed                      General Comments      Exercises     Assessment/Plan    PT Assessment Patient needs continued PT services  PT Problem List Decreased activity tolerance;Decreased knowledge of use of DME;Decreased knowledge of precautions;Decreased balance;Decreased mobility          PT Treatment Interventions Gait training;Functional mobility training;Therapeutic exercise;DME instruction;Therapeutic activities;Patient/family education    PT Goals (Current goals can be found in the Care Plan section)  Acute Rehab PT Goals Patient Stated Goal: return home PT Goal Formulation: With patient Time For Goal Achievement: 01/11/16 Potential to Achieve Goals: Good    Frequency Min 3X/week   Barriers to discharge         Co-evaluation               End of Session Equipment Utilized During Treatment: Gait belt Activity Tolerance: Patient tolerated treatment well Patient left: in bed;with call bell/phone within reach Nurse Communication: Mobility status         Time: [id number] PT Time Calculation (min) (ACUTE ONLY): 17 min   Charges:   PT Evaluation $PT Eval Moderate Complexity: 1 Procedure     PT G CodesMelford Aase 12/28/2015, 10:06 AM Elwyn Reach, [place]

## 2015-12-28 NOTE — Plan of Care (Signed)
Problem: Bowel/Gastric: Goal: Gastrointestinal status for postoperative course will improve Outcome: Completed/Met Date Met: 12/28/15 Pt tolerating advanced diet of carb. mod/heart healthy without difficulty, eating 75-100% of meals. (+) bowel sounds throughout abd. 1st BM 12-27-15.

## 2015-12-29 LAB — BASIC METABOLIC PANEL
Anion gap: 5 (ref 5–15)
BUN: 19 mg/dL (ref 6–20)
CHLORIDE: 107 mmol/L (ref 101–111)
CO2: 25 mmol/L (ref 22–32)
Calcium: 8.8 mg/dL — ABNORMAL LOW (ref 8.9–10.3)
Creatinine, Ser: 0.93 mg/dL (ref 0.61–1.24)
GFR calc Af Amer: >60 mL/min (ref >60)
GFR calc non Af Amer: >60 mL/min (ref >60)
Glucose, Bld: 139 mg/dL — ABNORMAL HIGH (ref 65–99)
POTASSIUM: 3.4 mmol/L — AB (ref 3.5–5.1)
SODIUM: 137 mmol/L (ref 135–145)

## 2015-12-29 LAB — GLUCOSE, CAPILLARY
GLUCOSE-CAPILLARY: 115 mg/dL — AB (ref 65–99)
GLUCOSE-CAPILLARY: 86 mg/dL (ref 65–99)
GLUCOSE-CAPILLARY: 97 mg/dL (ref 65–99)
Glucose-Capillary: 139 mg/dL — ABNORMAL HIGH (ref 65–99)
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
## 2015-12-29 NOTE — Progress Notes (Signed)
Physical Therapy Treatment Patient Details Name: Roger Pruitt MRN: [id number] DOB: [birth year] Today's Date: 22-Jan-2016    History of Present Illness 65 yo with CAD s/p CABGx 4. PMHx: CAD, MI, HTN, HLD, DM, bipolar, arthritis, Parkinsons, hyponatremia    PT Comments    Pt making steady progress.  Follow Up Recommendations  Home health PT     Equipment Recommendations  None recommended by PT    Recommendations for Other Services       Precautions / Restrictions Precautions Precautions: Fall;Sternal Restrictions Weight Bearing Restrictions: Yes (sternal precaution)    Mobility  Bed Mobility Overal bed mobility: Needs Assistance Bed Mobility: Supine to Sit     Supine to sit: Supervision     General bed mobility comments: Pt able to come to sit straight from supine without any use of hands. Cues not to push with UE's to get to the EOB.  Transfers Overall transfer level: Needs assistance   Transfers: Sit to/from Stand Sit to Stand: Supervision         General transfer comment: cues for hand placement and safety  Ambulation/Gait Ambulation/Gait assistance: Supervision Ambulation Distance (Feet): 200 Feet Assistive device: Rolling walker (2 wheeled) Gait Pattern/deviations: Step-through pattern;Decreased stride length Gait velocity: decr Gait velocity interpretation: Below normal speed for age/gender General Gait Details: 1 standing rest break. Dyspnea 2/4.   Stairs            Wheelchair Mobility    Modified Rankin (Stroke Patients Only)       Balance Overall balance assessment: Needs assistance Sitting-balance support: No upper extremity supported;Feet supported Sitting balance-Leahy Scale: Good     Standing balance support: No upper extremity supported Standing balance-Leahy Scale: Fair                      Cognition Arousal/Alertness: Awake/alert Behavior During Therapy: WFL for tasks assessed/performed Overall Cognitive  Status: Within Functional Limits for tasks assessed                      Exercises      General Comments        Pertinent Vitals/Pain      Home Living                      Prior Function            PT Goals (current goals can now be found in the care plan section) Progress towards PT goals: Progressing toward goals    Frequency    Min 3X/week      PT Plan Discharge plan needs to be updated    Co-evaluation             End of Session   Activity Tolerance: Patient tolerated treatment well Patient left: with call bell/phone within reach;in chair     Time: 1357-1410 PT Time Calculation (min) (ACUTE ONLY): 13 min  Charges:  $Gait Training: 8-22 mins                    G Codes:      Quamaine Webb 01/22/2016, 2:29 PM [employer] PT [phone]

## 2015-12-29 NOTE — Progress Notes (Addendum)
      [address]       [place] [address]             [phone]      4 Days Post-Op Procedure(s) (LRB): CORONARY ARTERY BYPASS GRAFTING (CABG) x4  , using left internal mammary artery and right leg greater saphenous vein harvested endoscopically LIMA-LAD SVG-OM2 SVG-DIAG SVG-PD (N/A) TRANSESOPHAGEAL ECHOCARDIOGRAM (TEE) (N/A) Subjective: Feeling better this morning. Sitting up in bed eating breakfast.  Objective: Vital signs in last 24 hours: Temp:  [97.5 F (36.4 C)-97.9 F (36.6 C)] 97.9 F (36.6 C) (10/03 0500) Pulse Rate:  [80-90] 90 (10/03 0500) Cardiac Rhythm: Normal sinus rhythm (10/02 1900) Resp:  [18-24] 18 (10/03 0500) BP: (97-145)/(61-82) 123/67 (10/03 0500) SpO2:  [97 %-99 %] 98 % (10/03 0500) Weight:  [217 lb 14.4 oz (98.8 kg)] 217 lb 14.4 oz (98.8 kg) (10/03 0500)     Intake/Output from previous day: 10/02 0701 - 10/03 0700 In: 2150 [P.O.:2150] Out: 4400 [Urine:4400] Intake/Output this shift: No intake/output data recorded.  General appearance: alert and no distress Heart: regular rate and rhythm, S1, S2 normal, no murmur, click, rub or gallop Lungs: clear to auscultation bilaterally Abdomen: soft, non-tender; bowel sounds normal; no masses,  no organomegaly Extremities: extremities normal, atraumatic, no cyanosis or edema Wound: c/d/i without drainage  Lab Results:  Recent Labs  12/27/15 0435 12/28/15 0614  WBC 7.2 7.8  HGB 7.8* 8.2*  HCT 23.3* 24.4*  PLT 120* 143*   BMET:  Recent Labs  12/28/15 0614 12/29/15 0220  NA 138 137  K 3.2* 3.4*  CL 104 107  CO2 28 25  GLUCOSE 108* 139*  BUN 17 19  CREATININE 1.08 0.93  CALCIUM 8.6* 8.8*    PT/INR: No results for input(s): LABPROT, INR in the last 72 hours. ABG    Component Value Date/Time   PHART 7.340 (L) 12/25/2015 1921   HCO3 22.6 12/25/2015 1921   TCO2 25 12/26/2015 1646   ACIDBASEDEF 3.0 (H) 12/25/2015 1921   O2SAT 95.0 12/25/2015 1921   CBG (last 3)    Recent Labs  12/28/15 1636 12/28/15 2059 12/29/15 0613  GLUCAP 120* 126* 115*    Assessment/Plan: S/P Procedure(s) (LRB): CORONARY ARTERY BYPASS GRAFTING (CABG) x4  , using left internal mammary artery and right leg greater saphenous vein harvested endoscopically LIMA-LAD SVG-OM2 SVG-DIAG SVG-PD (N/A) TRANSESOPHAGEAL ECHOCARDIOGRAM (TEE) (N/A)  1. CV- 90s NSR, possibly increase BB since BP could tolerate. D/c pacing wires. 2. Pulm- on room air. No CXR today. Encouraged IS 3. Endo-blood sugars under control 4. GI-continue to progress diet. No nausea 5. Renal-creatinine stable 0.93. Replacing potassium. Continue diuretics ->2L 6. Plavix preop?  7. Psych- Bipolar, taking Wellbutrin, Doxepin, and Invega. Ativan PRN for insomnia.  Plan: Mobilize, continue hourly Is. Maintaining NSR rate 90s. Discontinue pacing wires. Continue diuretics. Likely discharge home with family in 1-2 days.   LOS: 4 days    Roger Pruitt 12/29/2015 Patient seen and examined, agree with above Needs to ambulate more Will dc pacing wires  Remo Lipps C. Roxan Hockey, MD [facility] [phone]

## 2015-12-29 NOTE — Progress Notes (Signed)
Pacing wired discontinued per order. Pt tolerated well. Vitals signs being assessed Q15X4 Q30X4. 1 hr bed rest until 12pm. Isac Caddy, RN

## 2015-12-29 NOTE — Progress Notes (Signed)
CARDIAC REHAB PHASE I   PRE:  Rate/Rhythm: 92 SR  BP:  Supine:   Sitting: 120/72 right arm  Standing:    SaO2: 98%RA  MODE:  Ambulation: 60 ft  And then 260 ft after sitting in room   POST:  Rate/Rhythm: 108 , 95 SR  BP:  Supine:   Sitting: left arm 193/93, right 162/83  First walk   Standing:    SaO2: 99 %RA               157/80 second walk  0922-1002 Pt willing to walk and ready. Pt walked about 30 ft and stated he needed to sit down as he was dizzy and legs weak. Assisted back to room and had pt sit while I assessed vs. Pt's BP was very elevated at 193/93. Pt rested for a few minutes and decided he wanted to try again. He stated he felt as if he got up too fast. We walked 260 ft on RA with gait belt use, rolling walker and asst x 1. Tolerated better this time and BP 157/80 after walk. To recliner with call bell. Encouraged more walks with staff later.  Graylon Good, RN BSN  12/29/2015 9:56 AM

## 2015-12-29 NOTE — Discharge Instructions (Signed)
Coronary Artery Bypass Grafting, Care After °Refer to this sheet in the next few weeks. These instructions provide you with information on caring for yourself after your procedure. Your health care provider may also give you more specific instructions. Your treatment has been planned according to current medical practices, but problems sometimes occur. Call your health care provider if you have any problems or questions after your procedure. °WHAT TO EXPECT AFTER THE PROCEDURE °Recovery from surgery will be different for everyone. Some people feel well after 3 or 4 weeks, while for others it takes longer. After your procedure, it is typical to have the following: °· Nausea and a lack of appetite.   °· Constipation. °· Weakness and fatigue.   °· Depression or irritability.   °· Pain or discomfort at your incision site. °HOME CARE INSTRUCTIONS °· Take medicines only as directed by your health care provider. Do not stop taking medicines or start any new medicines without first checking with your health care provider. °· Take your pulse as directed by your health care provider. °· Perform deep breathing as directed by your health care provider. If you were given a device called an incentive spirometer, use it to practice deep breathing several times a day. Support your chest with a pillow or your arms when you take deep breaths or cough. °· Keep incision areas clean, dry, and protected. Remove or change any bandages (dressings) only as directed by your health care provider. You may have skin adhesive strips over the incision areas. Do not take the strips off. They will fall off on their own. °· Check incision areas daily for any swelling, redness, or drainage. °· If incisions were made in your legs, do the following: °¨ Avoid crossing your legs.   °¨ Avoid sitting for long periods of time. Change positions every 30 minutes.   °¨ Elevate your legs when you are sitting. °· Wear compression stockings as directed by your  health care provider. These stockings help keep blood clots from forming in your legs. °· Take showers once your health care provider approves. Until then, only take sponge baths. Pat incisions dry. Do not rub incisions with a washcloth or towel. Do not take baths, swim, or use a hot tub until your health care provider approves. °· Eat foods that are high in fiber, such as raw fruits and vegetables, whole grains, beans, and nuts. Meats should be lean cut. Avoid canned, processed, and fried foods. °· Drink enough fluid to keep your urine clear or pale yellow. °· Weigh yourself every day. This helps identify if you are retaining fluid that may make your heart and lungs work harder. °· Rest and limit activity as directed by your health care provider. You may be instructed to: °¨ Stop any activity at once if you have chest pain, shortness of breath, irregular heartbeats, or dizziness. Get help right away if you have any of these symptoms. °¨ Move around frequently for short periods or take short walks as directed by your health care provider. Increase your activities gradually. You may need physical therapy or cardiac rehabilitation to help strengthen your muscles and build your endurance. °¨ Avoid lifting, pushing, or pulling anything heavier than 10 lb (4.5 kg) for at least 6 weeks after surgery. °· Do not drive until your health care provider approves.  °· Ask your health care provider when you may return to work. °· Ask your health care provider when you may resume sexual activity. °· Keep all follow-up visits as directed by your health care   provider. This is important. °SEEK MEDICAL CARE IF: °· You have swelling, redness, increasing pain, or drainage at the site of an incision. °· You have a fever. °· You have swelling in your ankles or legs. °· You have pain in your legs.   °· You gain 2 or more pounds (0.9 kg) a day. °· You are nauseous or vomit. °· You have diarrhea.  °SEEK IMMEDIATE MEDICAL CARE IF: °· You have  chest pain that goes to your jaw or arms. °· You have shortness of breath.   °· You have a fast or irregular heartbeat.   °· You notice a "clicking" in your breastbone (sternum) when you move.   °· You have numbness or weakness in your arms or legs. °· You feel dizzy or light-headed.   °MAKE SURE YOU: °· Understand these instructions. °· Will watch your condition. °· Will get help right away if you are not doing well or get worse. °  °This information is not intended to replace advice given to you by your health care provider. Make sure you discuss any questions you have with your health care provider. °  °Document Released: 10/01/2004 Document Revised: 04/04/2014 Document Reviewed: 08/21/2012 °Elsevier Interactive Patient Education ©2016 [employer] Inc[employer] ° °

## 2015-12-30 LAB — GLUCOSE, CAPILLARY
GLUCOSE-CAPILLARY: 127 mg/dL — AB (ref 65–99)
GLUCOSE-CAPILLARY: 168 mg/dL — AB (ref 65–99)

## 2015-12-30 MED ORDER — ATORVASTATIN CALCIUM 40 MG PO TABS: 40.0000 mg | ORAL_TABLET | Freq: Every day | ORAL | 3 refills | Status: AC

## 2015-12-30 MED ORDER — CARVEDILOL 6.25 MG PO TABS: 6.2500 mg | ORAL_TABLET | Freq: Two times a day (BID) | ORAL | 3 refills | Status: DC

## 2015-12-30 MED ORDER — ASPIRIN 325 MG PO TBEC: 325.0000 mg | DELAYED_RELEASE_TABLET | Freq: Every day | ORAL | 0 refills | Status: DC

## 2015-12-30 NOTE — Care Management Important Message (Signed)
Important Message  Patient Details  Name: Roger Pruitt MRN: [id number] Date of Birth: April 10, 1950   Medicare Important Message Given:  Yes    [name] 12/30/2015, 10:41 AM

## 2015-12-30 NOTE — Care Management Note (Signed)
Case Management Note Previous CM note initiated by Maryclare Labrador, RN 12/28/2015, 11:17 AM   Patient Details  Name: Roger Pruitt MRN: [id number] Date of Birth: 1951-03-05  Subjective/Objective:    S/p CABG                Action/Plan:  PTA independent home from wife.  Pt has aid that comes in 5 days a week (2.5 hour a day for pt and 2.5 hours for wife - to help with cleaning , both pt and wife are independent with ADLs - per pt.  Pt has walker and cane already in the home.  Wife / medicaid paid aide will be with pt for the recommended supervision period.  CM will continue to follow for discharge needs Pt tx from ICU to 2W on 12/28/15  Expected Discharge Date:    12/30/15              Expected Discharge Plan:  [place]  In-House Referral:     Discharge planning Services  CM Consult  Post Acute Care Choice:  Home Health Choice offered to:  Patient  DME Arranged:  N/A DME Agency:  NA  HH Arranged:  PT [place]:  [place]  Status of Service:  Completed, signed off  If discussed at [place] of Stay Meetings, dates discussed:   Discharge Disposition: Home with Home Health    Additional Comments:  12/30/15- 1000- Marvetta Gibbons RN CM- pt for d/c home today- with wife- order placed for HHPT- spoke with pt at bedside- list provided for [place] agencies- per pt choice he would like to use [facility] for [place]- referral called to [place] with Alvis Lemmings for [place]- referral accepted-  Per pt he has needed DME at home- was thinking of [facility] but decided he didn't want it.   Dahlia Client [place], RN 12/30/2015, 10:18 AM [phone]

## 2015-12-30 NOTE — Progress Notes (Signed)
      [address]       [place] [address]             [phone]      5 Days Post-Op Procedure(s) (LRB): CORONARY ARTERY BYPASS GRAFTING (CABG) x4  , using left internal mammary artery and right leg greater saphenous vein harvested endoscopically LIMA-LAD SVG-OM2 SVG-DIAG SVG-PD (N/A) TRANSESOPHAGEAL ECHOCARDIOGRAM (TEE) (N/A)   Subjective:  Roger Pruitt has no complaints.  He is feeling good and wants to go home.    Objective: Vital signs in last 24 hours: Temp:  [97.8 F (36.6 C)-98 F (36.7 C)] 98 F (36.7 C) (10/04 0452) Pulse Rate:  [84-92] 88 (10/04 0452) Cardiac Rhythm: Normal sinus rhythm;Bundle branch block (10/04 0749) Resp:  [20] 20 (10/04 0452) BP: (107-135)/(65-80) 107/65 (10/04 0452) SpO2:  [92 %-100 %] 99 % (10/04 0452) Weight:  [222 lb 9.6 oz (101 kg)] 222 lb 9.6 oz (101 kg) (10/04 0452)  Intake/Output from previous day: 10/03 0701 - 10/04 0700 In: 476 [P.O.:476] Out: 1600 [Urine:1600] Intake/Output this shift: Total I/O In: -  Out: 500 [Urine:500]  General appearance: alert, cooperative and no distress Heart: regular rate and rhythm Lungs: clear to auscultation bilaterally Abdomen: soft, non-tender; bowel sounds normal; no masses,  no organomegaly Extremities: edema trace Wound: clean and dry  Lab Results:  Recent Labs  12/28/15 0614  WBC 7.8  HGB 8.2*  HCT 24.4*  PLT 143*   BMET:  Recent Labs  12/28/15 0614 12/29/15 0220  NA 138 137  K 3.2* 3.4*  CL 104 107  CO2 28 25  GLUCOSE 108* 139*  BUN 17 19  CREATININE 1.08 0.93  CALCIUM 8.6* 8.8*    PT/INR: No results for input(s): LABPROT, INR in the last 72 hours. ABG    Component Value Date/Time   PHART 7.340 (L) 12/25/2015 1921   HCO3 22.6 12/25/2015 1921   TCO2 25 12/26/2015 1646   ACIDBASEDEF 3.0 (H) 12/25/2015 1921   O2SAT 95.0 12/25/2015 1921   CBG (last 3)   Recent Labs  12/29/15 1611 12/29/15 2138 12/30/15 0619  GLUCAP 86 97 127*     Assessment/Plan: S/P Procedure(s) (LRB): CORONARY ARTERY BYPASS GRAFTING (CABG) x4  , using left internal mammary artery and right leg greater saphenous vein harvested endoscopically LIMA-LAD SVG-OM2 SVG-DIAG SVG-PD (N/A) TRANSESOPHAGEAL ECHOCARDIOGRAM (TEE) (N/A)  1. CV- NSR- continue Coreg 2. Pulm- no acute issues, continue IS 3. Renal- creatinine has been stable, weight is stable... Not requiring diuretics 4. H/O Bipolar disorder, continue home medications 5. DM- sugars controlled 6. Dispo- patient stable, maintaining NSR, H/H PT orders placed per recs... Will d/c home today   LOS: 5 days    Roger Pruitt 12/30/2015

## 2015-12-30 NOTE — Progress Notes (Signed)
0900-0925 Education completed with pt who voiced understanding. Encouraged IS and sternal precautions. Pt stated he has walker at home. Encouraged walking with assistance. Gave ex ed. Discussed counting carbs and heart healthy eating. Discussed CRP 2 which pt has done before. Will send letter of interest as I cannot write order for Dr Nehemiah Massed. Will notify [place] Phase 2. Put on discharge video for pt to view. Graylon Good RN BSN 12/30/2015 9:25 AM

## 2015-12-30 NOTE — Progress Notes (Signed)
Patient's chest tube sutures were removed without complication

## 2015-12-30 NOTE — Progress Notes (Signed)
Order received to discharge patient.  Patient expresses readiness to discharge.  Discharge instructions, follow up, medications and instructions for their use were discussed with patient and he voiced understanding. Telemetry monitor was removed and CCMD notified.  PIV access was removed without complication.

## 2015-12-31 ENCOUNTER — Encounter: Payer: Self-pay | Admitting: Emergency Medicine

## 2015-12-31 ENCOUNTER — Emergency Department: Payer: Medicare Other

## 2015-12-31 ENCOUNTER — Observation Stay
Admission: EM | Admit: 2015-12-31 | Discharge: 2016-01-04 | Disposition: A | Discharge status: Skilled Nursing Facility | Payer: Medicare Other | Attending: Internal Medicine | Admitting: Internal Medicine

## 2015-12-31 DIAGNOSIS — I119 Hypertensive heart disease without heart failure: Secondary | ICD-10-CM | POA: Insufficient documentation

## 2015-12-31 DIAGNOSIS — N400 Enlarged prostate without lower urinary tract symptoms: Secondary | ICD-10-CM | POA: Diagnosis not present

## 2015-12-31 DIAGNOSIS — G4730 Sleep apnea, unspecified: Secondary | ICD-10-CM | POA: Diagnosis not present

## 2015-12-31 DIAGNOSIS — I25719 Atherosclerosis of autologous vein coronary artery bypass graft(s) with unspecified angina pectoris: Secondary | ICD-10-CM | POA: Diagnosis not present

## 2015-12-31 DIAGNOSIS — J90 Pleural effusion, not elsewhere classified: Secondary | ICD-10-CM | POA: Diagnosis not present

## 2015-12-31 DIAGNOSIS — J302 Other seasonal allergic rhinitis: Secondary | ICD-10-CM | POA: Insufficient documentation

## 2015-12-31 DIAGNOSIS — I2510 Atherosclerotic heart disease of native coronary artery without angina pectoris: Secondary | ICD-10-CM | POA: Insufficient documentation

## 2015-12-31 DIAGNOSIS — R531 Weakness: Secondary | ICD-10-CM | POA: Insufficient documentation

## 2015-12-31 DIAGNOSIS — E785 Hyperlipidemia, unspecified: Secondary | ICD-10-CM | POA: Diagnosis not present

## 2015-12-31 DIAGNOSIS — I252 Old myocardial infarction: Secondary | ICD-10-CM | POA: Insufficient documentation

## 2015-12-31 DIAGNOSIS — F259 Schizoaffective disorder, unspecified: Secondary | ICD-10-CM | POA: Diagnosis not present

## 2015-12-31 DIAGNOSIS — R55 Syncope and collapse: Secondary | ICD-10-CM | POA: Diagnosis not present

## 2015-12-31 DIAGNOSIS — Z7982 Long term (current) use of aspirin: Secondary | ICD-10-CM | POA: Diagnosis not present

## 2015-12-31 DIAGNOSIS — Z8673 Personal history of transient ischemic attack (TIA), and cerebral infarction without residual deficits: Secondary | ICD-10-CM | POA: Insufficient documentation

## 2015-12-31 DIAGNOSIS — R42 Dizziness and giddiness: Secondary | ICD-10-CM | POA: Diagnosis not present

## 2015-12-31 DIAGNOSIS — G20 Parkinson's disease: Secondary | ICD-10-CM | POA: Insufficient documentation

## 2015-12-31 DIAGNOSIS — F419 Anxiety disorder, unspecified: Secondary | ICD-10-CM | POA: Insufficient documentation

## 2015-12-31 DIAGNOSIS — R5381 Other malaise: Secondary | ICD-10-CM | POA: Diagnosis not present

## 2015-12-31 DIAGNOSIS — R778 Other specified abnormalities of plasma proteins: Secondary | ICD-10-CM | POA: Insufficient documentation

## 2015-12-31 DIAGNOSIS — D649 Anemia, unspecified: Secondary | ICD-10-CM | POA: Diagnosis not present

## 2015-12-31 DIAGNOSIS — F319 Bipolar disorder, unspecified: Secondary | ICD-10-CM | POA: Insufficient documentation

## 2015-12-31 DIAGNOSIS — R0602 Shortness of breath: Secondary | ICD-10-CM | POA: Diagnosis not present

## 2015-12-31 DIAGNOSIS — E1140 Type 2 diabetes mellitus with diabetic neuropathy, unspecified: Secondary | ICD-10-CM | POA: Insufficient documentation

## 2015-12-31 DIAGNOSIS — Z85828 Personal history of other malignant neoplasm of skin: Secondary | ICD-10-CM | POA: Insufficient documentation

## 2015-12-31 DIAGNOSIS — Z7951 Long term (current) use of inhaled steroids: Secondary | ICD-10-CM | POA: Insufficient documentation

## 2015-12-31 DIAGNOSIS — K219 Gastro-esophageal reflux disease without esophagitis: Secondary | ICD-10-CM | POA: Diagnosis not present

## 2015-12-31 DIAGNOSIS — R748 Abnormal levels of other serum enzymes: Secondary | ICD-10-CM | POA: Diagnosis not present

## 2015-12-31 DIAGNOSIS — E860 Dehydration: Secondary | ICD-10-CM | POA: Insufficient documentation

## 2015-12-31 DIAGNOSIS — Z955 Presence of coronary angioplasty implant and graft: Secondary | ICD-10-CM | POA: Diagnosis not present

## 2015-12-31 DIAGNOSIS — Z79899 Other long term (current) drug therapy: Secondary | ICD-10-CM | POA: Insufficient documentation

## 2015-12-31 DIAGNOSIS — Z87891 Personal history of nicotine dependence: Secondary | ICD-10-CM | POA: Insufficient documentation

## 2015-12-31 DIAGNOSIS — I951 Orthostatic hypotension: Secondary | ICD-10-CM | POA: Diagnosis not present

## 2015-12-31 DIAGNOSIS — Z7902 Long term (current) use of antithrombotics/antiplatelets: Secondary | ICD-10-CM | POA: Insufficient documentation

## 2015-12-31 DIAGNOSIS — Z7984 Long term (current) use of oral hypoglycemic drugs: Secondary | ICD-10-CM | POA: Insufficient documentation

## 2015-12-31 DIAGNOSIS — Z951 Presence of aortocoronary bypass graft: Secondary | ICD-10-CM | POA: Diagnosis not present

## 2015-12-31 DIAGNOSIS — R262 Difficulty in walking, not elsewhere classified: Secondary | ICD-10-CM

## 2015-12-31 DIAGNOSIS — I959 Hypotension, unspecified: Secondary | ICD-10-CM | POA: Diagnosis not present

## 2015-12-31 LAB — URINALYSIS COMPLETE WITH MICROSCOPIC (ARMC ONLY)
Bacteria, UA: NONE SEEN
Bilirubin Urine: NEGATIVE
GLUCOSE, UA: NEGATIVE mg/dL
Hgb urine dipstick: NEGATIVE
KETONES UR: NEGATIVE mg/dL
Leukocytes, UA: NEGATIVE
Nitrite: NEGATIVE
PROTEIN: NEGATIVE mg/dL
RBC / HPF: NONE SEEN RBC/hpf (ref 0–5)
Specific Gravity, Urine: 1.006 (ref 1.005–1.030)
pH: 7 (ref 5.0–8.0)

## 2015-12-31 LAB — CBC WITH DIFFERENTIAL/PLATELET
BASOS PCT: 1 %
Basophils Absolute: 0 10*3/uL (ref 0–0.1)
EOS ABS: 0.2 10*3/uL (ref 0–0.7)
EOS PCT: 2 %
HCT: 27.1 % — ABNORMAL LOW (ref 40.0–52.0)
HEMOGLOBIN: 9.3 g/dL — AB (ref 13.0–18.0)
LYMPHS ABS: 1.4 10*3/uL (ref 1.0–3.6)
Lymphocytes Relative: 16 %
MCH: 31.7 pg (ref 26.0–34.0)
MCHC: 34.3 g/dL (ref 32.0–36.0)
MCV: 92.5 fL (ref 80.0–100.0)
MONO ABS: 0.8 10*3/uL (ref 0.2–1.0)
MONOS PCT: 9 %
Neutro Abs: 6.4 10*3/uL (ref 1.4–6.5)
Neutrophils Relative %: 72 %
PLATELETS: 225 10*3/uL (ref 150–440)
RBC: 2.92 MIL/uL — ABNORMAL LOW (ref 4.40–5.90)
RDW: 13.3 % (ref 11.5–14.5)
WBC: 8.7 10*3/uL (ref 3.8–10.6)

## 2015-12-31 LAB — TYPE AND SCREEN
ABO/RH(D): A POS
Antibody Screen: NEGATIVE
UNIT DIVISION: 0
UNIT DIVISION: 0
UNIT DIVISION: 0
Unit division: 0

## 2015-12-31 LAB — BRAIN NATRIURETIC PEPTIDE: B Natriuretic Peptide: 274 pg/mL — ABNORMAL HIGH (ref 0.0–100.0)

## 2015-12-31 LAB — GLUCOSE, CAPILLARY
Glucose-Capillary: 146 mg/dL — ABNORMAL HIGH (ref 65–99)
Glucose-Capillary: 98 mg/dL (ref 65–99)

## 2015-12-31 LAB — TROPONIN I
Troponin I: 0.28 ng/mL (ref <0.03)
Troponin I: 0.25 ng/mL
Troponin I: 0.26 ng/mL

## 2015-12-31 LAB — COMPREHENSIVE METABOLIC PANEL
ALK PHOS: 44 U/L (ref 38–126)
ALT: 21 U/L (ref 17–63)
AST: 20 U/L (ref 15–41)
Albumin: 3.4 g/dL — ABNORMAL LOW (ref 3.5–5.0)
Anion gap: 9 (ref 5–15)
BUN: 16 mg/dL (ref 6–20)
CALCIUM: 8.9 mg/dL (ref 8.9–10.3)
CHLORIDE: 106 mmol/L (ref 101–111)
CO2: 23 mmol/L (ref 22–32)
CREATININE: 0.95 mg/dL (ref 0.61–1.24)
GFR calc Af Amer: >60 mL/min (ref >60)
Glucose, Bld: 112 mg/dL — ABNORMAL HIGH (ref 65–99)
Potassium: 4 mmol/L (ref 3.5–5.1)
SODIUM: 138 mmol/L (ref 135–145)
Total Bilirubin: 0.7 mg/dL (ref 0.3–1.2)
Total Protein: 6.5 g/dL (ref 6.5–8.1)

## 2015-12-31 MED ORDER — CARBAMAZEPINE ER 100 MG PO TB12
300.0000 mg | ORAL_TABLET | Freq: Two times a day (BID) | ORAL | Status: DC
  Administered 2015-12-31 – 2016-01-04 (×8): 300 mg via ORAL
  Filled 2015-12-31 (×8): qty 3

## 2015-12-31 MED ORDER — SODIUM CHLORIDE 0.9 % IV SOLN
INTRAVENOUS | Status: DC
  Administered 2015-12-31 – 2016-01-01 (×2): via INTRAVENOUS

## 2015-12-31 MED ORDER — CLOPIDOGREL BISULFATE 75 MG PO TABS
75.0000 mg | ORAL_TABLET | Freq: Every day | ORAL | Status: DC
  Administered 2016-01-01 – 2016-01-04 (×4): 75 mg via ORAL
  Filled 2015-12-31 (×4): qty 1

## 2015-12-31 MED ORDER — POTASSIUM CHLORIDE CRYS ER 20 MEQ PO TBCR
20.0000 meq | EXTENDED_RELEASE_TABLET | Freq: Two times a day (BID) | ORAL | Status: DC
  Administered 2015-12-31 – 2016-01-02 (×4): 20 meq via ORAL
  Filled 2015-12-31 (×4): qty 1

## 2015-12-31 MED ORDER — PALIPERIDONE ER 3 MG PO TB24
6.0000 mg | ORAL_TABLET | Freq: Every day | ORAL | Status: DC
  Administered 2015-12-31 – 2016-01-03 (×4): 6 mg via ORAL
  Filled 2015-12-31 (×5): qty 2
  Filled 2015-12-31: qty 1

## 2015-12-31 MED ORDER — TRAMADOL HCL 50 MG PO TABS: 50.0000 mg | ORAL_TABLET | Freq: Four times a day (QID) | ORAL | Status: DC | PRN

## 2015-12-31 MED ORDER — SODIUM CHLORIDE 0.9% FLUSH: 3.0000 mL | INTRAVENOUS | Status: DC | PRN

## 2015-12-31 MED ORDER — ONDANSETRON HCL 4 MG PO TABS: 4.0000 mg | ORAL_TABLET | Freq: Four times a day (QID) | ORAL | Status: DC | PRN

## 2015-12-31 MED ORDER — INSULIN ASPART 100 UNIT/ML ~~LOC~~ SOLN
0.0000 [IU] | Freq: Three times a day (TID) | SUBCUTANEOUS | Status: DC
  Administered 2015-12-31: 1 [IU] via SUBCUTANEOUS
  Filled 2015-12-31: qty 1

## 2015-12-31 MED ORDER — CARVEDILOL 6.25 MG PO TABS
6.2500 mg | ORAL_TABLET | Freq: Two times a day (BID) | ORAL | Status: DC
  Administered 2015-12-31: 6.25 mg via ORAL
  Filled 2015-12-31: qty 1

## 2015-12-31 MED ORDER — ATORVASTATIN CALCIUM 20 MG PO TABS
40.0000 mg | ORAL_TABLET | Freq: Every day | ORAL | Status: DC
  Administered 2015-12-31 – 2016-01-03 (×4): 40 mg via ORAL
  Filled 2015-12-31 (×5): qty 2

## 2015-12-31 MED ORDER — MAGNESIUM OXIDE 400 (241.3 MG) MG PO TABS
400.0000 mg | ORAL_TABLET | Freq: Two times a day (BID) | ORAL | Status: DC
  Administered 2015-12-31 – 2016-01-04 (×8): 400 mg via ORAL
  Filled 2015-12-31 (×8): qty 1

## 2015-12-31 MED ORDER — SODIUM CHLORIDE 0.9% FLUSH
3.0000 mL | Freq: Two times a day (BID) | INTRAVENOUS | Status: DC
  Administered 2016-01-01 – 2016-01-04 (×5): 3 mL via INTRAVENOUS

## 2015-12-31 MED ORDER — PANTOPRAZOLE SODIUM 40 MG PO TBEC
40.0000 mg | DELAYED_RELEASE_TABLET | Freq: Every day | ORAL | Status: DC
  Administered 2016-01-01 – 2016-01-04 (×4): 40 mg via ORAL
  Filled 2015-12-31 (×4): qty 1

## 2015-12-31 MED ORDER — ONDANSETRON HCL 4 MG/2ML IJ SOLN: 4.0000 mg | Freq: Four times a day (QID) | INTRAMUSCULAR | Status: DC | PRN

## 2015-12-31 MED ORDER — SODIUM CHLORIDE 0.9 % IV SOLN: 250.0000 mL | INTRAVENOUS | Status: DC | PRN

## 2015-12-31 MED ORDER — INSULIN ASPART 100 UNIT/ML ~~LOC~~ SOLN: 0.0000 [IU] | Freq: Every day | SUBCUTANEOUS | Status: DC

## 2015-12-31 MED ORDER — METFORMIN HCL 500 MG PO TABS
1000.0000 mg | ORAL_TABLET | Freq: Two times a day (BID) | ORAL | Status: DC
  Administered 2015-12-31 – 2016-01-04 (×7): 1000 mg via ORAL
  Filled 2015-12-31 (×7): qty 2

## 2015-12-31 MED ORDER — SODIUM CHLORIDE 0.9% FLUSH
3.0000 mL | Freq: Two times a day (BID) | INTRAVENOUS | Status: DC
  Administered 2016-01-02 – 2016-01-03 (×3): 3 mL via INTRAVENOUS

## 2015-12-31 MED ORDER — LORAZEPAM 1 MG PO TABS
1.0000 mg | ORAL_TABLET | Freq: Two times a day (BID) | ORAL | Status: DC
Start: 2015-12-31 — End: 2016-01-04
  Administered 2015-12-31 – 2016-01-01 (×2): 2 mg via ORAL
  Administered 2016-01-01 – 2016-01-04 (×6): 1 mg via ORAL
  Filled 2015-12-31 (×2): qty 2
  Filled 2015-12-31 (×3): qty 1
  Filled 2015-12-31: qty 2
  Filled 2015-12-31 (×2): qty 1

## 2015-12-31 MED ORDER — GABAPENTIN 300 MG PO CAPS
300.0000 mg | ORAL_CAPSULE | Freq: Three times a day (TID) | ORAL | Status: DC
  Administered 2015-12-31 – 2016-01-04 (×12): 300 mg via ORAL
  Filled 2015-12-31 (×12): qty 1

## 2015-12-31 MED ORDER — SODIUM CHLORIDE 0.9 % IV BOLUS (SEPSIS)
500.0000 mL | Freq: Once | INTRAVENOUS | Status: AC
  Administered 2015-12-31: 500 mL via INTRAVENOUS

## 2015-12-31 MED ORDER — DOCUSATE SODIUM 100 MG PO CAPS
100.0000 mg | ORAL_CAPSULE | Freq: Two times a day (BID) | ORAL | Status: DC
  Administered 2015-12-31 – 2016-01-04 (×8): 100 mg via ORAL
  Filled 2015-12-31 (×8): qty 1

## 2015-12-31 MED ORDER — ASPIRIN EC 325 MG PO TBEC
325.0000 mg | DELAYED_RELEASE_TABLET | Freq: Every day | ORAL | Status: DC
  Administered 2016-01-01 – 2016-01-04 (×4): 325 mg via ORAL
  Filled 2015-12-31 (×4): qty 1

## 2015-12-31 MED ORDER — LINAGLIPTIN 5 MG PO TABS
5.0000 mg | ORAL_TABLET | Freq: Every day | ORAL | Status: DC
  Administered 2016-01-01: 5 mg via ORAL
  Filled 2015-12-31: qty 1

## 2015-12-31 MED ORDER — BUPROPION HCL ER (XL) 150 MG PO TB24
300.0000 mg | ORAL_TABLET | Freq: Every day | ORAL | Status: DC
  Administered 2016-01-01 – 2016-01-04 (×4): 300 mg via ORAL
  Filled 2015-12-31 (×4): qty 2

## 2015-12-31 MED ORDER — DOXEPIN HCL 50 MG PO CAPS
50.0000 mg | ORAL_CAPSULE | Freq: Every day | ORAL | Status: DC
  Administered 2015-12-31 – 2016-01-03 (×4): 50 mg via ORAL
  Filled 2015-12-31 (×5): qty 1

## 2015-12-31 MED ORDER — TOPIRAMATE 100 MG PO TABS
100.0000 mg | ORAL_TABLET | Freq: Three times a day (TID) | ORAL | Status: DC
  Administered 2015-12-31 – 2016-01-04 (×12): 100 mg via ORAL
  Filled 2015-12-31 (×12): qty 1

## 2015-12-31 MED ORDER — FLUTICASONE PROPIONATE 50 MCG/ACT NA SUSP
1.0000 | Freq: Every day | NASAL | Status: DC
  Administered 2016-01-01 – 2016-01-04 (×3): 1 via NASAL
  Filled 2015-12-31: qty 16

## 2015-12-31 MED ORDER — ACETAMINOPHEN 500 MG PO TABS: 1000.0000 mg | ORAL_TABLET | Freq: Four times a day (QID) | ORAL | Status: DC | PRN

## 2015-12-31 MED ORDER — INSULIN ASPART 100 UNIT/ML ~~LOC~~ SOLN
4.0000 [IU] | Freq: Three times a day (TID) | SUBCUTANEOUS | Status: DC
  Administered 2015-12-31 – 2016-01-04 (×10): 4 [IU] via SUBCUTANEOUS
  Filled 2015-12-31 (×10): qty 4

## 2015-12-31 MED ORDER — LISINOPRIL 20 MG PO TABS: 40.0000 mg | ORAL_TABLET | Freq: Every day | ORAL | Status: DC

## 2015-12-31 MED ORDER — FINASTERIDE 5 MG PO TABS
5.0000 mg | ORAL_TABLET | Freq: Every day | ORAL | Status: DC
  Administered 2016-01-01 – 2016-01-04 (×4): 5 mg via ORAL
  Filled 2015-12-31 (×4): qty 1

## 2015-12-31 NOTE — ED Triage Notes (Signed)
Pt c/o general weakness and dizziness yesterday. Quadruple bypass last Thursday. Syncope today with [facility] prior

## 2015-12-31 NOTE — ED Notes (Signed)
Troponin 0.28. Dr Burlene Arnt notified

## 2015-12-31 NOTE — ED Provider Notes (Addendum)
[facility] Emergency Department Provider Note  ____________________________________________   I have reviewed the triage vital signs and the nursing notes.   HISTORY  Chief Complaint Loss of Consciousness    HPI Roger Pruitt is a 65 y.o. male who has multiple medical problems had aCABG on 9/30 of this year, went home and was feeling well, and then had a brief episode of lightheadedness last night which resolved had a brief episode of lightheadedness today which resolved and then was seen to pass out briefly in the chair when his home nurse was there. Patient did not have any chest pain. States he may felt a little short of breath earlier today but was not feeling short of breath at the time of the incident. He did not have any other prodrome. He has no complaints at this time of any variety he feels 100% back to his baseline. Patient does not seem to have any pleuritic complaint there is no evidence to suggest the patient has a PE which causes syncope.     Past Medical History:  Diagnosis Date  . Abdominal pain   . Acute cystitis without hematuria   . Anemia   . Anginal pain ([place])   . Arthritis   . Bipolar disorder ([place])    BIPOLAR/ SCHIZO/ AFFECTIVE   . BPD (bronchopulmonary dysplasia)   . BPH (benign prostatic hyperplasia)   . CAD (coronary artery disease)    a. s/p MI 2001, 2012;  b. s/p prior LCX stenting; b. 05/2012 Abnl MV; c. 05/2012 Cath: patent LCX stent w/ otw mod non-obs dzs->Med Rx.  . Carpal tunnel syndrome   . Disc degeneration, lumbar   . Elevated PSA   . Esophageal reflux   . GERD (gastroesophageal reflux disease)   . Hematuria    history of gross and microscopic  . Hematuria   . Hemorrhoid   . History of balanitis   . History of bladder infections   . History of echocardiogram    a. 07/2014 Echo: EF 60-65%, no rwma, nl LA size, nl RV/PASP.  Marland Kitchen History of urethral stricture   . HLD (hyperlipidemia)   . Hyperlipidemia   .  Hypertension   . Hypertensive heart disease   . Incomplete bladder emptying   . Iron deficiency anemia, unspecified   . Lower urinary tract symptoms   . Myocardial infarction    X7  . Neuropathy ([place])   . Nocturia   . Obsessive compulsive disorder   . Other dysphagia   . Panic attack   . Parkinson's disease ([place])   . Sleep apnea    SURGICAL TX  . Stroke ([place])   . Type II diabetes mellitus ([place])   . Unspecified vitamin D deficiency   . Urethral stricture   . Urinary retention   . Varicose vein of leg   . Vitamin D deficiency     Patient Active Problem List   Diagnosis Date Noted  . S/P CABG x 4 12/25/2015  . Unstable angina ([place]) 12/01/2015  . Chest tightness or pressure 10/27/2015  . Hypertensive heart disease   . HLD (hyperlipidemia)   . Type II diabetes mellitus ([place])   . UTI (lower urinary tract infection) 07/20/2015  . Hyponatremia 07/20/2015  . Abnormal chest CT 07/10/2015  . Pulmonary nodules 07/09/2015  . Dyspnea 07/09/2015  . Basal cell carcinoma 05/27/2015  . Bipolar affective disorder ([place]) 05/27/2015  . CAD in native artery 05/27/2015  . Degeneration of intervertebral disc of lumbosacral  region 05/27/2015  . GERD (gastroesophageal reflux disease) 05/27/2015  . Benign essential HTN 05/27/2015  . Apnea, sleep 05/27/2015  . 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 Benign prostatic hypertrophy (BPH) with incomplete bladder emptying 11/29/2014  . Acute encephalopathy 08/23/2014  . CVA (cerebral infarction) 08/23/2014  . Hypokalemia 08/23/2014  . Leukocytosis 08/23/2014  . Acute CVA (cerebrovascular accident) ([place]) 08/23/2014  . Hypertensive urgency 01/01/2013  . Hypertension   . Chest pain 09/30/2011  . CAD (coronary artery disease) 09/30/2011  . S/P coronary artery stent placement 09/30/2011  . Hyperlipidemia 09/30/2011  . Diabetes mellitus ([place]) 09/30/2011  . H/O acute myocardial infarction 09/03/2011  . Allergic rhinitis, seasonal 09/03/2011    Past Surgical  History:  Procedure Laterality Date  . BACK SURGERY     lumbar disc  . BACK SURGERY    . CARDIAC CATHETERIZATION  6/10;2011;Aug.2012;Oct. 2012;   . CARDIAC CATHETERIZATION  05/2012   [facility]; EF 60%, patent stent in the left circumflex, moderate three-vessel disease with no flow-limiting lesions. Unchanged from most recent catheterization  . CARDIAC CATHETERIZATION N/A 12/10/2015   Procedure: Left Heart Cath and Coronary Angiography;  Surgeon: Corey Skains, MD;  Location: [facility];  Service: Cardiovascular;  Laterality: N/A;  . CARPAL TUNNEL RELEASE  2009   left  . CORONARY ANGIOPLASTY  2011 & 2001   s/p stent  . CORONARY ARTERY BYPASS GRAFT     stent placement  . CORONARY ARTERY BYPASS GRAFT N/A 12/25/2015   Procedure: CORONARY ARTERY BYPASS GRAFTING (CABG) x4  , using left internal mammary artery and right leg greater saphenous vein harvested endoscopically LIMA-LAD SVG-OM2 SVG-DIAG SVG-PD;  Surgeon: Melrose Nakayama, MD;  Location: [facility];  Service: Open Heart Surgery;  Laterality: N/A;  . [place]  . CYST REMOVAL LEG Right   . SKIN GRAFT    . TEE WITHOUT CARDIOVERSION N/A 12/25/2015   Procedure: TRANSESOPHAGEAL ECHOCARDIOGRAM (TEE);  Surgeon: Melrose Nakayama, MD;  Location: [facility];  Service: Open Heart Surgery;  Laterality: N/A;  . TONSILLECTOMY      Prior to Admission medications   Medication Sig Start Date End Date Taking? Authorizing Provider  ACCU-CHEK AVIVA PLUS test strip TEST TWICE DAILY 10/09/15   Historical Provider, MD  acetaminophen (TYLENOL) 500 MG tablet Take 1,000 mg by mouth every 6 (six) hours as needed (pain).    Historical Provider, MD  Alcohol Swabs (B-D SINGLE USE SWABS REGULAR) PADS  10/21/15   Historical Provider, MD  aspirin EC 325 MG EC tablet Take 1 tablet (325 mg total) by mouth daily. 12/30/15   Erin R Barrett, PA-C  atorvastatin (LIPITOR) 40 MG tablet Take 1 tablet (40 mg total) by mouth daily at 6 PM. 12/30/15   Erin R  Barrett, PA-C  buPROPion (WELLBUTRIN XL) 300 MG 24 hr tablet Take 300 mg by mouth daily at 12 noon. Takes at noon.    Historical Provider, MD  Carbamazepine (EQUETRO) 300 MG CP12 Take 300 mg by mouth 2 (two) times daily.     Historical Provider, MD  carvedilol (COREG) 6.25 MG tablet Take 1 tablet (6.25 mg total) by mouth 2 (two) times daily. 12/30/15   Erin R Barrett, PA-C  chlorthalidone (HYGROTON) 25 MG tablet Take 25 mg by mouth daily. Takes at noon.    Historical Provider, MD  clopidogrel (PLAVIX) 75 MG tablet Take 75 mg by mouth daily. 11/25/15   Historical Provider, MD  doxepin (SINEQUAN) 25 MG capsule Take 50 mg by mouth  at bedtime.     Historical Provider, MD  finasteride (PROSCAR) 5 MG tablet Take 5 mg by mouth daily.    Historical Provider, MD  gabapentin (NEURONTIN) 600 MG tablet Take 300 mg by mouth 3 (three) times daily.     Historical Provider, MD  JANUMET 50-1000 MG tablet Take 1 tablet by mouth 2 (two) times daily with a meal.  10/13/15   Historical Provider, MD  lisinopril (PRINIVIL,ZESTRIL) 40 MG tablet Take 1 tablet (40 mg total) by mouth daily. 10/25/13   Wellington [place], MD  LORazepam (ATIVAN) 1 MG tablet Take 1-2 mg by mouth 2 (two) times daily. Pt takes one tablet in the morning and two at bedtime.    Historical Provider, MD  magnesium oxide (MAG-OX) 400 (241.3 Mg) MG tablet Take 400 mg by mouth 2 (two) times daily.     Historical Provider, MD  metFORMIN (GLUCOPHAGE) 1000 MG tablet Take 1,000 mg by mouth 2 (two) times daily with a meal.    Historical Provider, MD  mometasone (NASONEX) 50 MCG/ACT nasal spray Place 2 sprays into the nose daily as needed (allergies).    Historical Provider, MD  paliperidone (INVEGA) 6 MG 24 hr tablet Take 6 mg by mouth at bedtime.     Historical Provider, MD  pantoprazole (PROTONIX) 40 MG tablet Take 40 mg by mouth daily. Takes at noon.    Historical Provider, MD  potassium chloride SA (K-DUR,KLOR-CON) 20 MEQ tablet Take 20 mEq by mouth 2 (two)  times daily.    Historical Provider, MD  sitaGLIPtin (JANUVIA) 100 MG tablet Take 100 mg by mouth daily.    Historical Provider, MD  Tamsulosin HCl (FLOMAX) 0.4 MG CAPS Take 0.4 mg by mouth daily after breakfast.     Historical Provider, MD  topiramate (TOPAMAX) 100 MG tablet Take 100 mg by mouth daily. Takes at noon.    Historical Provider, MD  traMADol (ULTRAM) 50 MG tablet Take 50 mg by mouth every 6 (six) hours as needed.    Historical Provider, MD    Allergies Morphine and related; Synalgos-dc [aspirin-caff-dihydrocodeine]; and Synchlor a [chlorpheniramine]  Family History  Problem Relation Age of Onset  . Heart disease Mother   . Heart disease Brother   . Prostate cancer Father   . Heart disease    . Heart disease Brother   . Kidney disease Neg Hx     Social History Social History  Substance Use Topics  . Smoking status: Former Smoker    Packs/day: 0.50    Years: 15.00    Types: Cigarettes    Quit date: 05/18/1999  . Smokeless tobacco: Never Used     Comment: quit 17 + years  . Alcohol use No    Review of Systems Constitutional: No fever/chills Eyes: No visual changes. ENT: No sore throat. No stiff neck no neck pain Cardiovascular: Denies chest pain. Respiratory: Denies shortness of breathAt this time. Gastrointestinal:   no vomiting.  No diarrhea.  No constipation. Genitourinary: Negative for dysuria. Musculoskeletal: Negative lower extremity swelling Skin: Negative for rash. Neurological: Negative for severe headaches, focal weakness or numbness. 10-point ROS otherwise negative.  ____________________________________________   PHYSICAL EXAM:  VITAL SIGNS: ED Triage Vitals  Enc Vitals Group     BP 12/31/15 1234 121/79     Pulse Rate 12/31/15 1234 83     Resp 12/31/15 1234 19     Temp 12/31/15 1233 98.1 F (36.7 C)     Temp Source 12/31/15 1233 Oral  SpO2 12/31/15 1234 100 %     Weight 12/31/15 1231 211 lb (95.7 kg)     Height 12/31/15 1231 6\' 3"   (1.905 m)     Head Circumference --      Peak Flow --      Pain Score --      Pain Loc --      Pain Edu? --      Excl. in [place]? --     Constitutional: Alert and oriented. Well appearing and in no acute distress. Eyes: Conjunctivae are normal. PERRL. EOMI. Head: Atraumatic. Nose: No congestion/rhinnorhea. Mouth/Throat: Mucous membranes are moist.  Oropharynx non-erythematous. Neck: No stridor.   Nontender with no meningismus Cardiovascular: Normal rate, regular rhythm. Grossly normal heart sounds.  Good peripheral circulation. Chest: Surgical site clean dry and intact with no evidence of infection Respiratory: Normal respiratory effort.  No retractions. Lungs CTAB. Abdominal: Soft and nontender. No distention. No guarding no rebound Back:  There is no focal tenderness or step off.  there is no midline tenderness there are no lesions noted. there is no CVA tenderness Musculoskeletal: No lower extremity tenderness, no upper extremity tenderness. No joint effusions, no DVT signs strong distal pulses no edema Neurologic:  Normal speech and language. No gross focal neurologic deficits are appreciated.  Skin:  Skin is warm, dry and intact. No rash noted. Psychiatric: Mood and affect are normal. Speech and behavior are normal.  ____________________________________________   LABS (all labs ordered are listed, but only abnormal results are displayed)  Labs Reviewed  TROPONIN I - Abnormal; Notable for the following:       Result Value   Troponin I 0.28 (*)    All other components within normal limits  CBC WITH DIFFERENTIAL/PLATELET - Abnormal; Notable for the following:    RBC 2.92 (*)    Hemoglobin 9.3 (*)    HCT 27.1 (*)    All other components within normal limits  URINALYSIS COMPLETEWITH MICROSCOPIC ([facility] ONLY) - Abnormal; Notable for the following:    Color, Urine YELLOW (*)    APPearance CLEAR (*)    Squamous Epithelial / LPF 0-5 (*)    All other components within normal limits   COMPREHENSIVE METABOLIC PANEL - Abnormal; Notable for the following:    Glucose, Bld 112 (*)    Albumin 3.4 (*)    All other components within normal limits  BRAIN NATRIURETIC PEPTIDE   ____________________________________________  EKG  I personally interpreted any EKGs ordered by me or triage Normal sinus rhythm rate 82 bpm, intraventricular conduction delay noted, no acute ST elevation or depression suggestive of ischemia. LAD noted. No significant change from prior ____________________________________________  RADIOLOGY  I reviewed any imaging ordered by me or triage that were performed during my shift and, if possible, patient and/or family made aware of any abnormal findings. ____________________________________________   PROCEDURES  Procedure(s) performed: None  Procedures  Critical Care performed: None  ____________________________________________   INITIAL IMPRESSION / ASSESSMENT AND PLAN / ED COURSE  Pertinent labs & imaging results that were available during my care of the patient were reviewed by me and considered in my medical decision making (see chart for details).  Patient in the immediate postoperative period had a syncopal event. He has not had any symptoms since that time nor did he have any significant symptoms prior to that. He has no evidence of a surgical infection on exam, his vital signs are reassuring his EKG is reassuring. I did discuss with the on-call PA,  Ms. Ahmed Prima for the cardiothoracic surgery service as Dr. Roxan Hockey was in the operating room. She feels the patient does not need to be transferred to [facility] slowed his cardiology is comfortable taking care of him here. I discussed with Dr. Ubaldo Glassing, who is on-call for Dr. Nehemiah Massed who is the patient's cardiologist according to the patient. They are comfortable taking care of him here as well. We will admit him for observation. The troponin is 0.28, and given the immediate postoperative period,  the surgical service does not feel that this is likely secondary to ischemia more likely secondary to the patient's recent surgery however this will need to be trended. Patient states he did have aspirin today.  Clinical Course   ____________________________________________   FINAL CLINICAL IMPRESSION(S) / ED DIAGNOSES  Final diagnoses:  None      This chart was dictated using voice recognition software.  Despite best efforts to proofread,  errors can occur which can change meaning.      Schuyler Amor, MD 12/31/15 [place], MD 12/31/15 1344

## 2015-12-31 NOTE — Consult Note (Signed)
[place]  CARDIOLOGY CONSULT NOTE  Patient ID: Roger Pruitt MRN: [id number] DOB/AGE: 65-Dec-1952 65 y.o.  Admit date: 12/31/2015 Referring Physician Dr. Ether Griffins Primary Physician   Primary Cardiologist Dr. Thana Farr Reason for Consultation syncope  HPI: Pt is a 65 yo male with history of recently diagnosed parkinsons disease, s/p cabg 6 days ago at [facility] who was discharged from [facility] yesterday. He became syncopal while working with physical therapy today. He has a mild troponin elevation of 0.24but no ischemic changes on ekg. His incisions are intact and there is no evidence of chf on cxr. He  States he felt weak and fatigued yesterday at time of discharge. He had ambulated with assistance at time of discharge. He was orthosatic in the er.   Review of Systems  HENT: Negative.   Eyes: Negative.   Respiratory: Negative.   Cardiovascular: Negative.   Gastrointestinal: Negative.   Genitourinary: Negative.   Musculoskeletal: Negative.   Skin: Negative.   Neurological: Positive for tremors and weakness.  Endo/Heme/Allergies: Negative.   Psychiatric/Behavioral: Negative.     Past Medical History:  Diagnosis Date  . Abdominal pain   . Acute cystitis without hematuria   . Anemia   . Anginal pain ([place])   . Arthritis   . Bipolar disorder ([place])    BIPOLAR/ SCHIZO/ AFFECTIVE   . BPD (bronchopulmonary dysplasia)   . BPH (benign prostatic hyperplasia)   . CAD (coronary artery disease)    a. s/p MI 2001, 2012;  b. s/p prior LCX stenting; b. 05/2012 Abnl MV; c. 05/2012 Cath: patent LCX stent w/ otw mod non-obs dzs->Med Rx.  . Carpal tunnel syndrome   . Disc degeneration, lumbar   . Elevated PSA   . Esophageal reflux   . GERD (gastroesophageal reflux disease)   . Hematuria    history of gross and microscopic  . Hematuria   . Hemorrhoid   . History of balanitis   . History of bladder infections   . History of echocardiogram    a. 07/2014  Echo: EF 60-65%, no rwma, nl LA size, nl RV/PASP.  Marland Kitchen History of urethral stricture   . HLD (hyperlipidemia)   . Hyperlipidemia   . Hypertension   . Hypertensive heart disease   . Incomplete bladder emptying   . Iron deficiency anemia, unspecified   . Lower urinary tract symptoms   . Myocardial infarction    X7  . Neuropathy ([place])   . Nocturia   . Obsessive compulsive disorder   . Other dysphagia   . Panic attack   . Parkinson's disease ([place])   . Sleep apnea    SURGICAL TX  . Stroke ([place])   . Type II diabetes mellitus ([place])   . Unspecified vitamin D deficiency   . Urethral stricture   . Urinary retention   . Varicose vein of leg   . Vitamin D deficiency     Family History  Problem Relation Age of Onset  . Heart disease Mother   . Heart disease Brother   . Prostate cancer Father   . Heart disease    . Heart disease Brother   . Kidney disease Neg Hx     Social History   Social History  . Marital status: Married    Spouse name: N/A  . Number of children: N/A  . Years of education: N/A   Occupational History  . Not on file.   Social History Main Topics  . Smoking  status: Former Smoker    Packs/day: 0.50    Years: 15.00    Types: Cigarettes    Quit date: 05/18/1999  . Smokeless tobacco: Never Used     Comment: quit 17 + years  . Alcohol use No  . Drug use: No  . Sexual activity: Not on file   Other Topics Concern  . Not on file   Social History Narrative   ** Merged History Encounter **        Past Surgical History:  Procedure Laterality Date  . BACK SURGERY     lumbar disc  . BACK SURGERY    . CARDIAC CATHETERIZATION  6/10;2011;Aug.2012;Oct. 2012;   . CARDIAC CATHETERIZATION  05/2012   [facility]; EF 60%, patent stent in the left circumflex, moderate three-vessel disease with no flow-limiting lesions. Unchanged from most recent catheterization  . CARDIAC CATHETERIZATION N/A 12/10/2015   Procedure: Left Heart Cath and Coronary Angiography;  Surgeon: Corey Skains, MD;  Location: [place] [facility];  Service: Cardiovascular;  Laterality: N/A;  . CARPAL TUNNEL RELEASE  2009   left  . CORONARY ANGIOPLASTY  2011 & 2001   s/p stent  . CORONARY ARTERY BYPASS GRAFT     stent placement  . CORONARY ARTERY BYPASS GRAFT N/A 12/25/2015   Procedure: CORONARY ARTERY BYPASS GRAFTING (CABG) x4  , using left internal mammary artery and right leg greater saphenous vein harvested endoscopically LIMA-LAD SVG-OM2 SVG-DIAG SVG-PD;  Surgeon: Melrose Nakayama, MD;  Location: [place] [facility];  Service: Open Heart Surgery;  Laterality: N/A;  . [place]  . CYST REMOVAL LEG Right   . SKIN GRAFT    . TEE WITHOUT CARDIOVERSION N/A 12/25/2015   Procedure: TRANSESOPHAGEAL ECHOCARDIOGRAM (TEE);  Surgeon: Melrose Nakayama, MD;  Location: [place];  Service: Open Heart Surgery;  Laterality: N/A;  . TONSILLECTOMY       Prescriptions Prior to Admission  Medication Sig Dispense Refill Last Dose  . acetaminophen (TYLENOL) 500 MG tablet Take 1,000 mg by mouth every 6 (six) hours as needed (pain).   prn at prn  . aspirin EC 325 MG EC tablet Take 1 tablet (325 mg total) by mouth daily. 30 tablet 0 12/31/2015 at 0800  . atorvastatin (LIPITOR) 40 MG tablet Take 1 tablet (40 mg total) by mouth daily at 6 PM. 30 tablet 3 12/30/2015 at Unknown time  . buPROPion (WELLBUTRIN XL) 300 MG 24 hr tablet Take 300 mg by mouth daily. Takes at noon.   12/31/2015 at 0800  . Carbamazepine (EQUETRO) 300 MG CP12 Take 300 mg by mouth 2 (two) times daily.    12/31/2015 at 0800  . carvedilol (COREG) 6.25 MG tablet Take 1 tablet (6.25 mg total) by mouth 2 (two) times daily. 60 tablet 3 12/31/2015 at 0800  . chlorthalidone (HYGROTON) 25 MG tablet Take 25 mg by mouth daily. Takes at noon.   12/30/2015 at 1200  . clopidogrel (PLAVIX) 75 MG tablet Take 75 mg by mouth daily.   12/31/2015 at Unknown time  . doxepin (SINEQUAN) 25 MG capsule Take 50 mg by mouth at bedtime.    12/30/2015 at Unknown time  .  finasteride (PROSCAR) 5 MG tablet Take 5 mg by mouth daily.   12/31/2015 at 0800  . fluticasone (FLONASE) 50 MCG/ACT nasal spray Place 1 spray into both nostrils daily.   12/31/2015 at 0800  . gabapentin (NEURONTIN) 300 MG capsule Take 300 mg by mouth 3 (three) times daily.  12/31/2015 at 0800  . JANUMET 50-1000 MG tablet Take 1 tablet by mouth 2 (two) times daily with a meal.    12/31/2015 at 0800  . lisinopril (PRINIVIL,ZESTRIL) 40 MG tablet Take 1 tablet (40 mg total) by mouth daily. 90 tablet 3 12/31/2015 at 0800  . LORazepam (ATIVAN) 1 MG tablet Take 1-2 mg by mouth 2 (two) times daily. Pt takes one tablet in the morning and two at bedtime.   12/31/2015 at 0800  . magnesium oxide (MAG-OX) 400 (241.3 Mg) MG tablet Take 400 mg by mouth 2 (two) times daily.    12/31/2015 at 0800  . metFORMIN (GLUCOPHAGE) 1000 MG tablet Take 1,000 mg by mouth 2 (two) times daily with a meal.   12/31/2015 at 080  . paliperidone (INVEGA) 6 MG 24 hr tablet Take 6 mg by mouth at bedtime.    12/30/2015 at pm  . pantoprazole (PROTONIX) 40 MG tablet Take 40 mg by mouth daily. Takes at noon.   12/30/2015 at 1200  . potassium chloride SA (K-DUR,KLOR-CON) 20 MEQ tablet Take 20 mEq by mouth 2 (two) times daily.   12/31/2015 at 0800  . sitaGLIPtin (JANUVIA) 100 MG tablet Take 100 mg by mouth daily.   12/31/2015 at 0800  . topiramate (TOPAMAX) 100 MG tablet Take 100 mg by mouth 3 (three) times daily. Takes at noon.   12/31/2015 at 0800  . traMADol (ULTRAM) 50 MG tablet Take 50 mg by mouth every 6 (six) hours as needed.   12/30/2015 at Unknown time  . ACCU-CHEK AVIVA PLUS test strip TEST TWICE DAILY  0 Taking  . Alcohol Swabs (B-D SINGLE USE SWABS REGULAR) PADS    Taking    Physical Exam: Blood pressure 120/77, pulse 89, temperature 97.6 F (36.4 C), temperature source Oral, resp. rate 16, height 6\' 3"  (1.905 m), weight 95.2 kg (209 lb 12.8 oz), SpO2 (!) 86 %.   Wt Readings from Last 1 Encounters:  12/31/15 95.2 kg (209 lb 12.8 oz)      General appearance: cooperative Resp: clear to auscultation bilaterally Cardio: regular rate and rhythm GI: soft, non-tender; bowel sounds normal; no masses,  no organomegaly Extremities: extremities normal, atraumatic, no cyanosis or edema Neurologic: Mental status: trmor and flat affect  Labs:   Lab Results  Component Value Date   WBC 8.7 12/31/2015   HGB 9.3 (L) 12/31/2015   HCT 27.1 (L) 12/31/2015   MCV 92.5 12/31/2015   PLT 225 12/31/2015    Recent Labs Lab 12/31/15 1240  NA 138  K 4.0  CL 106  CO2 23  BUN 16  CREATININE 0.95  CALCIUM 8.9  PROT 6.5  BILITOT 0.7  ALKPHOS 44  ALT 21  AST 20  GLUCOSE 112*   Lab Results  Component Value Date   CKTOTAL 44 02/11/2014   CKMB 0.9 02/11/2014   TROPONINI 0.26 ([place]) 12/31/2015      Radiology: no pulmonary edema EKG: nsr  ASSESSMENT AND PLAN:  Pt with recent cabg discharged from [facility] yesterday who had a syncopal episode today while getting physical therapy. Was orthostatic in the er. Fairly debilitated exacerbated by his parkinsons disease. Will gently hydrate and will resume post cabg physical therapy. Will liekly need rehab admision at discharge. Continue current meds.  Signed: Teodoro Spray MD, [facility] 12/31/2015, 11:30 PM

## 2015-12-31 NOTE — H&P (Addendum)
[place] at [place] NAME: Roger Pruitt    MR#:  [id number]  DATE OF BIRTH:  [birth year]  DATE OF ADMISSION:  12/31/2015  PRIMARY CARE PHYSICIAN: Casilda Carls, MD   REQUESTING/REFERRING PHYSICIAN:   CHIEF COMPLAINT:   Chief Complaint  Patient presents with  . Loss of Consciousness    HISTORY OF PRESENT ILLNESS: Roger Pruitt  is a 65 y.o. male with a known history of Coronary artery disease, status post coronary artery bypass grafting about a week ago at [facility], Arthritis, bipolar disorder, hypertension, who presents to the hospital with complaints of feeling dizzy, weak and passing out. When the patient, he was exercising with physical therapy, when he suddenly became dizzy, very weak, he was helped to the chair, however, passed out for approximately 1 minute. No incontinence was reported and no seizure. Patient was completing some blurring of vision in the morning to. He was brought to emergency room for further evaluation where he was noted to have normal vital signs, although orthostatic vital signs were never performed, labs were unremarkable, except a mild anemia with hemoglobin level of 9.3, troponin was slightly elevated at 0.28, hospitalist services were contacted for admission .  PS orthostatic vital signs were positive, so the pressure dropped down from 143-122 as soon as patient was placed from laying position to up to sitting  PAST MEDICAL HISTORY:   Past Medical History:  Diagnosis Date  . Abdominal pain   . Acute cystitis without hematuria   . Anemia   . Anginal pain ([place])   . Arthritis   . Bipolar disorder ([place])    BIPOLAR/ SCHIZO/ AFFECTIVE   . BPD (bronchopulmonary dysplasia)   . BPH (benign prostatic hyperplasia)   . CAD (coronary artery disease)    a. s/p MI 2001, 2012;  b. s/p prior LCX stenting; b. 05/2012 Abnl MV; c. 05/2012 Cath: patent LCX stent w/ otw mod non-obs dzs->Med Rx.  . Carpal tunnel syndrome   . Disc  degeneration, lumbar   . Elevated PSA   . Esophageal reflux   . GERD (gastroesophageal reflux disease)   . Hematuria    history of gross and microscopic  . Hematuria   . Hemorrhoid   . History of balanitis   . History of bladder infections   . History of echocardiogram    a. 07/2014 Echo: EF 60-65%, no rwma, nl LA size, nl RV/PASP.  Marland Kitchen History of urethral stricture   . HLD (hyperlipidemia)   . Hyperlipidemia   . Hypertension   . Hypertensive heart disease   . Incomplete bladder emptying   . Iron deficiency anemia, unspecified   . Lower urinary tract symptoms   . Myocardial infarction    X7  . Neuropathy ([place])   . Nocturia   . Obsessive compulsive disorder   . Other dysphagia   . Panic attack   . Parkinson's disease ([place])   . Sleep apnea    SURGICAL TX  . Stroke ([place])   . Type II diabetes mellitus ([place])   . Unspecified vitamin D deficiency   . Urethral stricture   . Urinary retention   . Varicose vein of leg   . Vitamin D deficiency     PAST SURGICAL HISTORY: Past Surgical History:  Procedure Laterality Date  . BACK SURGERY     lumbar disc  . BACK SURGERY    . CARDIAC CATHETERIZATION  6/10;2011;Aug.2012;Oct. 2012;   . CARDIAC CATHETERIZATION  05/2012  [facility]; EF 60%, patent stent in the left circumflex, moderate three-vessel disease with no flow-limiting lesions. Unchanged from most recent catheterization  . CARDIAC CATHETERIZATION N/A 12/10/2015   Procedure: Left Heart Cath and Coronary Angiography;  Surgeon: Corey Skains, MD;  Location: [place] [facility];  Service: Cardiovascular;  Laterality: N/A;  . CARPAL TUNNEL RELEASE  2009   left  . CORONARY ANGIOPLASTY  2011 & 2001   s/p stent  . CORONARY ARTERY BYPASS GRAFT     stent placement  . CORONARY ARTERY BYPASS GRAFT N/A 12/25/2015   Procedure: CORONARY ARTERY BYPASS GRAFTING (CABG) x4  , using left internal mammary artery and right leg greater saphenous vein harvested endoscopically LIMA-LAD SVG-OM2 SVG-DIAG  SVG-PD;  Surgeon: Melrose Nakayama, MD;  Location: [place];  Service: Open Heart Surgery;  Laterality: N/A;  . [place]  . CYST REMOVAL LEG Right   . SKIN GRAFT    . TEE WITHOUT CARDIOVERSION N/A 12/25/2015   Procedure: TRANSESOPHAGEAL ECHOCARDIOGRAM (TEE);  Surgeon: Melrose Nakayama, MD;  Location: [place];  Service: Open Heart Surgery;  Laterality: N/A;  . TONSILLECTOMY      SOCIAL HISTORY:  Social History  Substance Use Topics  . Smoking status: Former Smoker    Packs/day: 0.50    Years: 15.00    Types: Cigarettes    Quit date: 05/18/1999  . Smokeless tobacco: Never Used     Comment: quit 17 + years  . Alcohol use No    FAMILY HISTORY:  Family History  Problem Relation Age of Onset  . Heart disease Mother   . Heart disease Brother   . Prostate cancer Father   . Heart disease    . Heart disease Brother   . Kidney disease Neg Hx     DRUG ALLERGIES:  Allergies  Allergen Reactions  . Morphine And Related Nausea And Vomiting  . Synalgos-Dc [Aspirin-Caff-Dihydrocodeine] Itching and Other (See Comments)    Reaction:  Stinging   . Synchlor A [Chlorpheniramine] Rash    Patient doesn't recall this allergy.    Review of Systems  Constitutional: Positive for weight loss. Negative for chills and fever.  HENT: Negative for congestion.   Eyes: Positive for blurred vision. Negative for double vision.  Respiratory: Positive for cough and shortness of breath. Negative for sputum production and wheezing.   Cardiovascular: Negative for chest pain, palpitations, orthopnea, leg swelling and PND.  Gastrointestinal: Negative for abdominal pain, blood in stool, constipation, diarrhea, nausea and vomiting.  Genitourinary: Negative for dysuria, frequency, hematuria and urgency.  Musculoskeletal: Negative for falls.  Neurological: Positive for loss of consciousness and weakness. Negative for dizziness, tremors, focal weakness and headaches.  Endo/Heme/Allergies: Does not  bruise/bleed easily.  Psychiatric/Behavioral: Negative for depression. The patient does not have insomnia.     MEDICATIONS AT HOME:  Prior to Admission medications   Medication Sig Start Date End Date Taking? Authorizing Provider  acetaminophen (TYLENOL) 500 MG tablet Take 1,000 mg by mouth every 6 (six) hours as needed (pain).   Yes Historical Provider, MD  aspirin EC 325 MG EC tablet Take 1 tablet (325 mg total) by mouth daily. 12/30/15  Yes Erin R Barrett, PA-C  atorvastatin (LIPITOR) 40 MG tablet Take 1 tablet (40 mg total) by mouth daily at 6 PM. 12/30/15  Yes Erin R Barrett, PA-C  buPROPion (WELLBUTRIN XL) 300 MG 24 hr tablet Take 300 mg by mouth daily. Takes at noon.   Yes Historical Provider, MD  Carbamazepine (EQUETRO) 300 MG CP12 Take 300 mg by mouth 2 (two) times daily.    Yes Historical Provider, MD  carvedilol (COREG) 6.25 MG tablet Take 1 tablet (6.25 mg total) by mouth 2 (two) times daily. 12/30/15  Yes Erin R Barrett, PA-C  chlorthalidone (HYGROTON) 25 MG tablet Take 25 mg by mouth daily. Takes at noon.   Yes Historical Provider, MD  clopidogrel (PLAVIX) 75 MG tablet Take 75 mg by mouth daily. 11/25/15  Yes Historical Provider, MD  doxepin (SINEQUAN) 25 MG capsule Take 50 mg by mouth at bedtime.    Yes Historical Provider, MD  finasteride (PROSCAR) 5 MG tablet Take 5 mg by mouth daily.   Yes Historical Provider, MD  fluticasone (FLONASE) 50 MCG/ACT nasal spray Place 1 spray into both nostrils daily.   Yes Historical Provider, MD  gabapentin (NEURONTIN) 300 MG capsule Take 300 mg by mouth 3 (three) times daily.    Yes Historical Provider, MD  JANUMET 50-1000 MG tablet Take 1 tablet by mouth 2 (two) times daily with a meal.  10/13/15  Yes Historical Provider, MD  lisinopril (PRINIVIL,ZESTRIL) 40 MG tablet Take 1 tablet (40 mg total) by mouth daily. 10/25/13  Yes Wellington Hampshire, MD  LORazepam (ATIVAN) 1 MG tablet Take 1-2 mg by mouth 2 (two) times daily. Pt takes one tablet in the  morning and two at bedtime.   Yes Historical Provider, MD  magnesium oxide (MAG-OX) 400 (241.3 Mg) MG tablet Take 400 mg by mouth 2 (two) times daily.    Yes Historical Provider, MD  metFORMIN (GLUCOPHAGE) 1000 MG tablet Take 1,000 mg by mouth 2 (two) times daily with a meal.   Yes Historical Provider, MD  paliperidone (INVEGA) 6 MG 24 hr tablet Take 6 mg by mouth at bedtime.    Yes Historical Provider, MD  pantoprazole (PROTONIX) 40 MG tablet Take 40 mg by mouth daily. Takes at noon.   Yes Historical Provider, MD  potassium chloride SA (K-DUR,KLOR-CON) 20 MEQ tablet Take 20 mEq by mouth 2 (two) times daily.   Yes Historical Provider, MD  sitaGLIPtin (JANUVIA) 100 MG tablet Take 100 mg by mouth daily.   Yes Historical Provider, MD  topiramate (TOPAMAX) 100 MG tablet Take 100 mg by mouth 3 (three) times daily. Takes at noon.   Yes Historical Provider, MD  traMADol (ULTRAM) 50 MG tablet Take 50 mg by mouth every 6 (six) hours as needed.   Yes Historical Provider, MD  ACCU-CHEK AVIVA PLUS test strip TEST TWICE DAILY 10/09/15   Historical Provider, MD  Alcohol Swabs (B-D SINGLE USE SWABS REGULAR) PADS  10/21/15   Historical Provider, MD      PHYSICAL EXAMINATION:   VITAL SIGNS: Blood pressure (!) 161/85, pulse 88, temperature 98.1 F (36.7 C), temperature source Oral, resp. rate 20, height 6\' 3"  (1.905 m), weight 95.7 kg (211 lb), SpO2 100 %.  GENERAL:  65 y.o.-year-old patient lying in the bed with no acute distress. Dry oral mucosa. Pale complexion, appears weak, chronically ill, sunken eyes EYES: Pupils equal, round, reactive to light and accommodation. No scleral icterus. Extraocular muscles intact.  HEENT: Head atraumatic, normocephalic. Oropharynx and nasopharynx clear, dry.  NECK:  Supple, no jugular venous distention. No thyroid enlargement, no tenderness.  LUNGS: Normal breath sounds bilaterally, no wheezing, rales,rhonchi or crepitation. No use of accessory muscles of respiration.   CARDIOVASCULAR: S1, S2 normal. No murmurs, rubs, or gallops.  ABDOMEN: Soft, nontender, nondistended. Bowel sounds present. No organomegaly or mass.  EXTREMITIES: No pedal edema, cyanosis, or clubbing. Right hand has skin changes due to skin graft NEUROLOGIC: Cranial nerves II through XII are intact. Muscle strength 5/5 in all extremities. Sensation intact. Gait not checked.  PSYCHIATRIC: The patient is alert and oriented x 3. Slow movements, has difficulty sitting up, slow responses SKIN: No obvious rash, lesion, or ulcer.   LABORATORY PANEL:   CBC  Recent Labs Lab 12/26/15 0400  12/26/15 1646 12/26/15 1701 12/27/15 0435 12/28/15 0614 12/31/15 1240  WBC 7.0  --   --  7.8 7.2 7.8 8.7  HGB 8.9*  < > 9.2* 8.6* 7.8* 8.2* 9.3*  HCT 26.4*  < > 27.0* 25.8* 23.3* 24.4* 27.1*  PLT 146*  --   --  125* 120* 143* 225  MCV 91.3  --   --  91.8 91.7 91.7 92.5  MCH 30.8  --   --  30.6 30.7 30.8 31.7  MCHC 33.7  --   --  33.3 33.5 33.6 34.3  RDW 12.9  --   --  13.0 13.2 13.6 13.3  LYMPHSABS  --   --   --   --   --   --  1.4  MONOABS  --   --   --   --   --   --  0.8  EOSABS  --   --   --   --   --   --  0.2  BASOSABS  --   --   --   --   --   --  0.0  < > = values in this interval not displayed. ------------------------------------------------------------------------------------------------------------------  Chemistries   Recent Labs Lab 12/26/15 1701  12/31/15 1240  NA  --   < > 138  K  --   < > 4.0  CL  --   < > 106  CO2  --   < > 23  GLUCOSE  --   < > 112*  BUN  --   < > 16  CREATININE 0.95  < > 0.95  CALCIUM  --   < > 8.9  MG 1.8  --   --   AST  --   --  20  ALT  --   --  21  ALKPHOS  --   --  44  BILITOT  --   --  0.7  < > = values in this interval not displayed. ------------------------------------------------------------------------------------------------------------------  Cardiac Enzymes  Recent Labs Lab 12/31/15 1240  TROPONINI 0.28*    ------------------------------------------------------------------------------------------------------------------  RADIOLOGY: Dg Chest 2 View  Result Date: 12/31/2015 CLINICAL DATA:  65 year old male with general weakness and dizziness now 1 week status post 4 vessel CABG. EXAM: CHEST  2 VIEW COMPARISON:  Prior chest x-ray 12/28/2015 FINDINGS: Stable cardiac and mediastinal contours. Patient is status post median sternotomy with evidence of multivessel CABG including LIMA bypass. Significantly improved bibasilar atelectasis compared to prior. There are persistent small bilateral layering pleural effusions. The lungs are clear without evidence of pulmonary edema or consolidation. No pneumothorax. No acute osseous abnormality. IMPRESSION: 1. Improved appearance of the chest compared to 12/28/2015 with resolution of bibasilar atelectasis. 2. Persistent but improving small bilateral layering pleural effusions. 3. No evidence of acute cardiopulmonary process. Electronically Signed   By: Jacqulynn Cadet M.D.   On: 12/31/2015 13:12    EKG: Orders placed or performed during the hospital encounter of 12/31/15  . EKG 12-Lead  . EKG 12-Lead  . ED EKG  . ED EKG  EKG in the emergency room sinus rhythm at 82 bpm, nonspecific IVCD with LAD, abnormal initial Q waves, ST elevation in inferior leads, no significant changes bilateral lead IMPRESSION AND PLAN:  Principal Problem:   Syncope Active Problems:   Status post coronary artery bypass grafting #1 syncope due to orthostatic hypotension, dehydration, admit patient to medical floor, hold diuretics, initiate him on IV fluids, follow orthostatic vital signs every shift and discontinue IV fluids as soon as orthostatics normalized #2. Orthostatic hypotension, as above. Continue IV fluids, following orthostatic vital signs #3. Elevated troponin, follow cardiac enzymes, no chest pain was recorded, likely trending after surgery, continue outpatient  medications #4 anemia, followed with rehydration #5. Generalized weakness, get physical therapist evaluation, patient's family wants the patient to be placed to rehabilitation   All the records are reviewed and case discussed with ED provider. Management plans discussed with the patient, family and they are in agreement.  CODE STATUS: Code Status History    Date Active Date Inactive Code Status Order ID Comments User Context   12/25/2015  1:32 PM 12/30/2015  4:40 PM Full Code [id number]  John Giovanni, PA-C Inpatient   07/28/2015 11:29 PM 07/30/2015  9:21 PM Full Code [id number]  Harrie Foreman, MD Inpatient   07/21/2015 12:29 AM 07/23/2015  5:33 PM Full Code [id number]  Lance Coon, MD Inpatient   08/23/2014  4:52 PM 08/24/2014  4:09 PM Full Code [id number]  Demetrios Loll, MD Inpatient   01/01/2013  4:19 AM 01/01/2013  8:29 PM Full Code [id number]  Toy Baker, MD Inpatient       TOTAL TIME TAKING CARE OF THIS PATIENT: 50 minutes.    Theodoro Grist M.D on 12/31/2015 at 2:37 PM  Between 7am to 6pm - Pager - [phone] After 6pm go to [email or web address] - password [email or web address] [facility]  Office  [phone]  CC: Primary care physician; Casilda Carls, MD

## 2015-12-31 NOTE — Progress Notes (Signed)
65 yo gentleman with complex medical history admitted for syncope, general weakness, dizziness, and passing out while working with physical therapy.  Patient had bypass surgery 6 days ago at [facility]. Chest and leg incisions are clean, dry and healing well. Postural BP was positive this am in ED, negative on admission to floor. States he had some dizziness when first sitting at the side of the bed, but it went away quickly.  Patient requested to ask for assistance when getting up to the BR.  Also using a urinal. Also has new diagnosis of Parkinsons.  Denies pain.  Oriented to room and unit routines

## 2015-12-31 NOTE — Care Management Obs Status (Signed)
[place] NOTIFICATION   Patient Details  Name: Roger Pruitt MRN: [id number] Date of Birth: 1950/05/08   Medicare Observation Status Notification Given:   yes    Beau Fanny, RN 12/31/2015, 2:27 PM

## 2015-12-31 NOTE — ED Notes (Signed)
Informed RN bed ready 

## 2016-01-01 ENCOUNTER — Encounter: Payer: Self-pay | Admitting: *Deleted

## 2016-01-01 DIAGNOSIS — R748 Abnormal levels of other serum enzymes: Secondary | ICD-10-CM | POA: Diagnosis not present

## 2016-01-01 DIAGNOSIS — I951 Orthostatic hypotension: Secondary | ICD-10-CM | POA: Diagnosis not present

## 2016-01-01 DIAGNOSIS — R55 Syncope and collapse: Secondary | ICD-10-CM | POA: Diagnosis not present

## 2016-01-01 DIAGNOSIS — I2510 Atherosclerotic heart disease of native coronary artery without angina pectoris: Secondary | ICD-10-CM | POA: Diagnosis not present

## 2016-01-01 LAB — BASIC METABOLIC PANEL
Anion gap: 9 (ref 5–15)
BUN: 15 mg/dL (ref 6–20)
CHLORIDE: 110 mmol/L (ref 101–111)
CO2: 22 mmol/L (ref 22–32)
CREATININE: 0.86 mg/dL (ref 0.61–1.24)
Calcium: 8.9 mg/dL (ref 8.9–10.3)
GFR calc Af Amer: >60 mL/min (ref >60)
GFR calc non Af Amer: >60 mL/min (ref >60)
Glucose, Bld: 108 mg/dL — ABNORMAL HIGH (ref 65–99)
POTASSIUM: 3.7 mmol/L (ref 3.5–5.1)
SODIUM: 141 mmol/L (ref 135–145)

## 2016-01-01 LAB — GLUCOSE, CAPILLARY
GLUCOSE-CAPILLARY: 99 mg/dL (ref 65–99)
Glucose-Capillary: 103 mg/dL — ABNORMAL HIGH (ref 65–99)
Glucose-Capillary: 104 mg/dL — ABNORMAL HIGH (ref 65–99)
Glucose-Capillary: 108 mg/dL — ABNORMAL HIGH (ref 65–99)

## 2016-01-01 LAB — CBC
HEMATOCRIT: 25.3 % — AB (ref 40.0–52.0)
Hemoglobin: 8.8 g/dL — ABNORMAL LOW (ref 13.0–18.0)
MCH: 31.9 pg (ref 26.0–34.0)
MCHC: 34.8 g/dL (ref 32.0–36.0)
MCV: 91.7 fL (ref 80.0–100.0)
Platelets: 234 10*3/uL (ref 150–440)
RBC: 2.76 MIL/uL — AB (ref 4.40–5.90)
RDW: 13.8 % (ref 11.5–14.5)
WBC: 7 10*3/uL (ref 3.8–10.6)

## 2016-01-01 LAB — TROPONIN I: Troponin I: 0.22 ng/mL (ref <0.03)

## 2016-01-01 MED ORDER — CARVEDILOL 3.125 MG PO TABS
3.1250 mg | ORAL_TABLET | Freq: Two times a day (BID) | ORAL | Status: DC
  Administered 2016-01-01: 3.125 mg via ORAL
  Filled 2016-01-01: qty 1

## 2016-01-01 MED ORDER — LISINOPRIL 5 MG PO TABS
2.5000 mg | ORAL_TABLET | Freq: Every day | ORAL | Status: DC
  Administered 2016-01-01: 2.5 mg via ORAL
  Filled 2016-01-01: qty 1

## 2016-01-01 MED ORDER — SODIUM CHLORIDE 0.9 % IV SOLN
INTRAVENOUS | Status: DC
  Administered 2016-01-01 – 2016-01-02 (×2): via INTRAVENOUS

## 2016-01-01 NOTE — Clinical Social Work Note (Addendum)
MSW acknowledge receipt of consult for SNF placement.  MSW to assess pending PT recommendations.  Patient has a Level 2 passar screen, MSW waiting for Passar number for patient.  Patient unable to discharge to a SNF until Passar number has been received.  MSW to continue to follow patient's progress throughout discharge planning.  Jones Broom. Aloura Matsuoka, MSW [phone]  Mon-Fri 8a-4:30p 01/01/2016 9:14 AM

## 2016-01-01 NOTE — Progress Notes (Signed)
Patient ID: Roger Pruitt, male   DOB: Aug 08, 1950, 65 y.o.   MRN: [id number]  [employer] PROGRESS NOTE  Roger Pruitt [id number] DOB: 02-11-1951 DOA: 12/31/2015 PCP: Casilda Carls, MD  HPI/Subjective: Patient felt dizzy with standing. He did well after his bypass and did well enough to go home. When at home he had a passing out episode.  Objective: Vitals:   01/01/16 0747 01/01/16 1110  BP: 105/73 (!) 149/75  Pulse: 87 87  Resp: 20 16  Temp: 97.2 F (36.2 C) 97.6 F (36.4 C)    Filed Weights   12/31/15 1231 12/31/15 1545  Weight: 95.7 kg (211 lb) 95.2 kg (209 lb 12.8 oz)    ROS: Review of Systems  Constitutional: Negative for chills and fever.  Eyes: Negative for blurred vision.  Respiratory: Negative for cough and shortness of breath.   Cardiovascular: Negative for chest pain.  Gastrointestinal: Negative for constipation, diarrhea, nausea and vomiting.  Genitourinary: Negative for dysuria.  Musculoskeletal: Negative for joint pain.  Neurological: Positive for weakness. Negative for dizziness and headaches.   Exam: Physical Exam  Constitutional: He is oriented to person, place, and time.  HENT:  Nose: No mucosal edema.  Mouth/Throat: No oropharyngeal exudate or posterior oropharyngeal edema.  Eyes: Conjunctivae, EOM and lids are normal. Pupils are equal, round, and reactive to light.  Neck: No JVD present. Carotid bruit is not present. No edema present. No thyroid mass and no thyromegaly present.  Cardiovascular: S1 normal and S2 normal.  Exam reveals no gallop.   No murmur heard. Pulses:      Dorsalis pedis pulses are 2+ on the right side, and 2+ on the left side.  Respiratory: No respiratory distress. He has no wheezes. He has no rhonchi. He has no rales.  GI: Soft. Bowel sounds are normal. There is no tenderness.  Musculoskeletal:       Right ankle: He exhibits no swelling.       Left ankle: He exhibits no swelling.  Lymphadenopathy:    He has no cervical  adenopathy.  Neurological: He is alert and oriented to person, place, and time. No cranial nerve deficit.  Skin: Skin is warm. No rash noted. Nails show no clubbing.  Psychiatric: He has a normal mood and affect.      Data Reviewed: Basic Metabolic Panel:  Recent Labs Lab 12/25/15 2009  12/26/15 0400  12/26/15 1701 12/27/15 0435 12/28/15 [id number] 12/29/15 0220 12/31/15 1240 01/01/16 0350  NA  --   --  138  < >  --  139 138 137 138 141  K  --   --  3.4*  < >  --  3.2* 3.2* 3.4* 4.0 3.7  CL  --   --  107  < >  --  106 104 107 106 110  CO2  --   < > 25  --   --  28 28 25 23 22   GLUCOSE  --   --  108*  < >  --  82 108* 139* 112* 108*  BUN  --   --  16  < >  --  17 17 19 16 15   CREATININE 1.05  --  0.99  < > 0.95 0.97 1.08 0.93 0.95 0.86  CALCIUM  --   < > 8.1*  --   --  8.3* 8.6* 8.8* 8.9 8.9  MG 2.4  --  2.0  --  1.8  --   --   --   --   --   < > =  values in this interval not displayed. Liver Function Tests:  Recent Labs Lab 12/31/15 1240  AST 20  ALT 21  ALKPHOS 44  BILITOT 0.7  PROT 6.5  ALBUMIN 3.4*   CBC:  Recent Labs Lab 12/26/15 1701 12/27/15 0435 12/28/15 0614 12/31/15 1240 01/01/16 0350  WBC 7.8 7.2 7.8 8.7 7.0  NEUTROABS  --   --   --  6.4  --   HGB 8.6* 7.8* 8.2* 9.3* 8.8*  HCT 25.8* 23.3* 24.4* 27.1* 25.3*  MCV 91.8 91.7 91.7 92.5 91.7  PLT 125* 120* 143* 225 234   Cardiac Enzymes:  Recent Labs Lab 12/31/15 1240 12/31/15 1605 12/31/15 2138 01/01/16 0350  TROPONINI 0.28* 0.25* 0.26* 0.22*   BNP (last 3 results)  Recent Labs  12/31/15 1240  BNP 274.0*    CBG:  Recent Labs Lab 12/30/15 1111 12/31/15 1640 12/31/15 2105 01/01/16 0731 01/01/16 1111  GLUCAP 168* 146* 98 103* 104*    Studies: Dg Chest 2 View  Result Date: 12/31/2015 CLINICAL DATA:  65 year old male with general weakness and dizziness now 1 week status post 4 vessel CABG. EXAM: CHEST  2 VIEW COMPARISON:  Prior chest x-ray 12/28/2015 FINDINGS: Stable cardiac and  mediastinal contours. Patient is status post median sternotomy with evidence of multivessel CABG including LIMA bypass. Significantly improved bibasilar atelectasis compared to prior. There are persistent small bilateral layering pleural effusions. The lungs are clear without evidence of pulmonary edema or consolidation. No pneumothorax. No acute osseous abnormality. IMPRESSION: 1. Improved appearance of the chest compared to 12/28/2015 with resolution of bibasilar atelectasis. 2. Persistent but improving small bilateral layering pleural effusions. 3. No evidence of acute cardiopulmonary process. Electronically Signed   By: Jacqulynn Cadet M.D.   On: 12/31/2015 13:12    Scheduled Meds: . aspirin  325 mg Oral Daily  . atorvastatin  40 mg Oral q1800  . buPROPion  300 mg Oral Daily  . carbamazepine  300 mg Oral BID  . clopidogrel  75 mg Oral Daily  . docusate sodium  100 mg Oral BID  . doxepin  50 mg Oral QHS  . finasteride  5 mg Oral Daily  . fluticasone  1 spray Each Nare Daily  . gabapentin  300 mg Oral TID  . insulin aspart  0-5 Units Subcutaneous QHS  . insulin aspart  0-9 Units Subcutaneous TID WC  . insulin aspart  4 Units Subcutaneous TID WC  . LORazepam  1-2 mg Oral BID  . magnesium oxide  400 mg Oral BID  . metFORMIN  1,000 mg Oral BID WC  . paliperidone  6 mg Oral QHS  . pantoprazole  40 mg Oral Daily  . potassium chloride SA  20 mEq Oral BID  . sodium chloride flush  3 mL Intravenous Q12H  . sodium chloride flush  3 mL Intravenous Q12H  . topiramate  100 mg Oral TID    Assessment/Plan:  1. Orthostatic hypotension and syncope. Hold lisinopril and Coreg. IV fluid hydration 2. CAD with recent CABG. Continue aspirin, Plavix and statin. Blood pressure too low at this point for any other medication 3. Type 2 diabetes mellitus. Metformin only at this point stop other medications 4. Elevated troponin. Patient had a recent CABG not too concerned. 5. Weakness. Physical therapy  recommended rehabilitation and this needs to be approved by insurance company. 6. GERD on Protonix 7. Hyperlipidemia unspecified on atorvastatin 8. BPH on finasteride 9. Continue psychiatric medications  Code Status:     Code Status  Orders        Start     Ordered   12/31/15 1548  Full code  Continuous     12/31/15 1548    Code Status History    Date Active Date Inactive Code Status Order ID Comments User Context   12/25/2015  1:32 PM 12/30/2015  4:40 PM Full Code [id number]  John Giovanni, PA-C Inpatient   07/28/2015 11:29 PM 07/30/2015  9:21 PM Full Code [id number]  Harrie Foreman, MD Inpatient   07/21/2015 12:29 AM 07/23/2015  5:33 PM Full Code [id number]  Lance Coon, MD Inpatient   08/23/2014  4:52 PM 08/24/2014  4:09 PM Full Code [id number]  Demetrios Loll, MD Inpatient   01/01/2013  4:19 AM 01/01/2013  8:29 PM Full Code [id number]  Toy Baker, MD Inpatient    Advance Directive Documentation   Flowsheet Row Most Recent Value  Type of Advance Directive  Healthcare Power of Attorney, Living will  Pre-existing out of facility DNR order (yellow form or pink MOST form)  No data  "MOST" Form in Place?  No data     Disposition Plan: Potentially out to rehabilitation once insurance cup and he approves  Consultants:  Cardiology  Time spent: 25 minutes  [place], [place]

## 2016-01-01 NOTE — Care Management (Signed)
Patient discharged from [facility] 10/3 after cabg x 4 surgery.  Was being seen by home health physical therapist and had syncopal event.  There is a current PT consult pending.  Patient and his wife receive in home aide 5 days a week.  has a walker.  Spoke with Karolee Stamps with [facility] and if patient is able to discharge back home, will add home health nurse to current referral.  Wife verbalizes concern that patient is weak and should go to skilled nursing.  Discussed this will have to be approved by his insurance.  Patient ambulated 200 feet with therapist at [facility] on 10/3

## 2016-01-01 NOTE — Clinical Social Work Placement (Addendum)
   CLINICAL SOCIAL WORK PLACEMENT  NOTE  Date:  01/01/2016  Patient Details  Name: Roger Pruitt MRN: [id number] Date of Birth: 01-04-1951  Clinical Social Work is seeking post-discharge placement for this patient at the [place] level of care (*CSW will initial, date and re-position this form in  chart as items are completed):  Yes   Patient/family provided with [place] Work Department's list of facilities offering this level of care within the geographic area requested by the patient (or if unable, by the patient's family).  Yes   Patient/family informed of their freedom to choose among providers that offer the needed level of care, that participate in Medicare, Medicaid or managed care program needed by the patient, have an available bed and are willing to accept the patient.  Yes   Patient/family informed of [facility]'s ownership interest in [facility] and [facility], as well as of the fact that they are under no obligation to receive care at these facilities.  PASRR submitted to EDS on 01/01/16     PASRR number received on  01-04-16  (updated Evette Cristal, MSW, 01-04-16)   Existing PASRR number confirmed on       FL2 transmitted to all facilities in geographic area requested by pt/family on 01/01/16     FL2 transmitted to all facilities within larger geographic area on       Patient informed that his/her managed care company has contracts with or will negotiate with certain facilities, including the following:         01-04-16   Patient/family informed of bed offers received.  (updated Evette Cristal, MSW, 01-04-16)  Patient chooses bed at  [facility] (updated Evette Cristal, MSW, 01-04-16)     Physician recommends and patient chooses bed at      Patient to be transferred to  [facility] on  01-04-16 (updated Evette Cristal, MSW, 01-04-16).  Patient to be transferred to facility by  [facility]  (updated Evette Cristal, MSW, 01-04-16)     Patient family notified on  01-04-16 of transfer. (updated Evette Cristal, MSW, 01-04-16)  Name of family member notified:   [name] [phone] (updated Evette Cristal, MSW, 01-04-16)     PHYSICIAN Please sign FL2     Additional Comment:    _______________________________________________ Ross Ludwig 01/01/2016, 5:12 PM

## 2016-01-01 NOTE — Clinical Social Work Note (Signed)
Clinical Social Work Assessment  Patient Details  Name: Roger Pruitt MRN: [id number] Date of Birth: [birth year]  Date of referral:  01/01/16               Reason for consult:  Facility Placement                Permission sought to share information with:  Facility Sport and exercise psychologist, Family Supports Permission granted to share information::  Yes, Verbal Permission Granted  Name::     Stucki,Vonnie Spouse [phone]  [phone]   Agency::  SNF admissions  Relationship::     Contact Information:     Housing/Transportation Living arrangements for the past 2 months:  Single Family Home Source of Information:  Patient Patient Interpreter Needed:  None Criminal Activity/Legal Involvement Pertinent to Current Situation/Hospitalization:  No - Comment as needed Significant Relationships:  Spouse Lives with:  Spouse Do you feel safe going back to the place where you live?  No Need for family participation in patient care:  No (Coment)  Care giving concerns:  Patient and family feel he needs some short term rehab before he is able to return back home.   Social Worker assessment / plan:  Patient is a 65 year old male who is married and lives with his wife.  Patient is alert and oriented x4 and ablet o express his feelings.  Patient was recently discharged home with home health from [facility], however he was readmitted to [facility].  Patient states he has been to rehab in the past and would like to return to [facility] SNF if beds are available.  Patient was explained role of MSW and process for looking for beds for short term rehab.  MSW informed patient that he may not be able to get to go to [facility], based on bed availability and he agreed to have MSW look for other facilities in [facility] as well.  Patient did not express any other questions or concerns.  Employment status:  Retired Forensic scientist:  Medicaid In [place], [place] PT Recommendations:  [place] / Referral to community resources:     Patient/Family's Response to care:  Patient and wife agreeable to have patient go to SNF for short term rehab.  Patient/Family's Understanding of and Emotional Response to Diagnosis, Current Treatment, and Prognosis:  Patient states he did not feel like he was ready to discharge home, and he is now looking to go to SNF for rehab instead.  Patient's wife is in agreement to going to SNF as well.  Emotional Assessment Appearance:  Appears stated age Attitude/Demeanor/Rapport:    Affect (typically observed):  Appropriate Orientation:  Oriented to  Time, Oriented to Place, Oriented to Self, Oriented to Situation Alcohol / Substance use:    Psych involvement (Current and /or in the community):  No (Comment)  Discharge Needs  Concerns to be addressed:  Lack of Support Readmission within the last 30 days:  Yes (October 4th 2017 to home with home health.) Current discharge risk:  Lack of support system Barriers to Discharge:  Awaiting State Approval ([place])   Ross Ludwig 01/01/2016, 5:06 PM

## 2016-01-01 NOTE — Progress Notes (Signed)
Initial Nutrition Assessment  DOCUMENTATION CODES:   Not applicable  INTERVENTION:  -Recommend addition of Heart Healthy to current diet order. Cater to pt preferences within dietary restrictions -Provided verbal/written education on CABG Nutrition Therapy, included Heart Healthy Label Reading and Cooking/Shopping tips as well. Also provided separate handout on low sodium nutrition therapy. Receptive to education, adherence likely  NUTRITION DIAGNOSIS:   Food and nutrition related knowledge deficit related to  (nutrition therapy post CABG) as evidenced by  (pt and wife requesting diet education).  GOAL:   Patient will meet greater than or equal to 90% of their needs  MONITOR:   PO intake, Weight trends, Labs  REASON FOR ASSESSMENT:   Malnutrition Screening Tool    ASSESSMENT:    65 yo male admitted with weakness, syncope after CABG x 4 on 12/25/15 at [facility].   Pt reports good appetite at present; recorded po intake 100% of meals. Pt does report approximately 10 pound wt loss post surgery; pt attributes this to being on liquid diet with poor appetite post surgery. 4.6% wt loss. Although noted per weight encounters pt with wt of 222 pounds on 10/4 at [facility]. Unsure if true wt loss or difference in scale.   Nutrition-Focused physical exam completed. Findings are WDL for fat depletion, muscle depletion, and edema.   Past Medical History:  Diagnosis Date  . Abdominal pain   . Acute cystitis without hematuria   . Anemia   . Anginal pain ([place])   . Arthritis   . Bipolar disorder ([place])    BIPOLAR/ SCHIZO/ AFFECTIVE   . BPD (bronchopulmonary dysplasia)   . BPH (benign prostatic hyperplasia)   . CAD (coronary artery disease)    a. s/p MI 2001, 2012;  b. s/p prior LCX stenting; b. 05/2012 Abnl MV; c. 05/2012 Cath: patent LCX stent w/ otw mod non-obs dzs->Med Rx.  . Carpal tunnel syndrome   . Disc degeneration, lumbar   . Elevated PSA   . Esophageal reflux   . GERD  (gastroesophageal reflux disease)   . Hematuria    history of gross and microscopic  . Hemorrhoid   . History of balanitis   . History of bladder infections   . History of echocardiogram    a. 07/2014 Echo: EF 60-65%, no rwma, nl LA size, nl RV/PASP.  Marland Kitchen History of urethral stricture   . Hyperlipidemia   . Hypertension   . Hypertensive heart disease   . Incomplete bladder emptying   . Iron deficiency anemia, unspecified   . Lower urinary tract symptoms   . Myocardial infarction    X7  . Neuropathy ([place])   . Nocturia   . Obsessive compulsive disorder   . Other dysphagia   . Panic attack   . Parkinson's disease ([place])   . Sleep apnea    SURGICAL TX  . Stroke ([place])   . Type II diabetes mellitus ([place])   . Unspecified vitamin D deficiency   . Urethral stricture   . Urinary retention   . Varicose vein of leg   . Vitamin D deficiency      Diet Order:  Carb Modified Diet  Skin:  Reviewed, no issues  Last BM:  10/4   Labs:  Glucose Profile:   Recent Labs  12/31/15 2105 01/01/16 0731 01/01/16 1111  GLUCAP 98 103* 104*    Lipid Profile:     Component Value Date/Time   CHOL 187 08/24/2014 0604   CHOL 124 01/31/2014 0448  TRIG 148 08/24/2014 0604   TRIG 117 01/31/2014 0448   HDL 39 (L) 08/24/2014 0604   HDL 25 (L) 01/31/2014 0448   CHOLHDL 4.8 08/24/2014 0604   VLDL 30 08/24/2014 0604   VLDL 23 01/31/2014 0448   LDLCALC 118 (H) 08/24/2014 0604   LDLCALC 76 01/31/2014 0448   Meds: NS at 75 ml/hr, ss novolog with meals, glucophage  Height:   Ht Readings from Last 1 Encounters:  12/31/15 6\' 3"  (1.905 m)    Weight:   Wt Readings from Last 1 Encounters:  12/31/15 209 lb 12.8 oz (95.2 kg)   Filed Weights   12/31/15 1231 12/31/15 1545  Weight: 211 lb (95.7 kg) 209 lb 12.8 oz (95.2 kg)    BMI:  Body mass index is 26.22 kg/m.  Estimated Nutritional Needs:   Kcal:  2000-2200 kcals   Protein:  100-110 g  Fluid:  >/= 2 L  EDUCATION NEEDS:    Education needs addressed  Kerman Passey MS, RD, LDN [phone] Pager  [phone] Weekend/On-Call Pager

## 2016-01-01 NOTE — Evaluation (Signed)
Physical Therapy Evaluation Patient Details Name: Roger Pruitt MRN: [id number] DOB: 01-Jun-1950 Today's Date: 01/01/2016   History of Present Illness  Pt is a 65 y/o M who was d/c from [facility] on 10/3 after CABG x4.  While working with [place] pt had a syncopal event and was brought to [facility]. Per cardiologist note pt was orthostatic in the ER. Pt's PMH includes anemia, bipolar disorder, lumbar DDD, MI, neuropathy, Parkinson's Disease, panic attack, stroke, back surgery.    Clinical Impression  Pt admitted with above diagnosis. Pt currently with functional limitations due to the deficits listed below (see PT Problem List). Mr. Scaff presents with dizziness with positional changes, although not orthostatic.  He was limited to short distance ambulation (3 ft) in room due to +dizziness.  Recommending SNF at d/c given his current mobility status and the fact that his wife is able to provide supervision but is not available to provide physical assist as she uses a rollator herself. Pt will benefit from skilled PT to increase their independence and safety with mobility to allow discharge to the venue listed below.      Follow Up Recommendations SNF;Supervision for mobility/OOB    Equipment Recommendations  Rolling walker with 5" wheels (Pt reports he does not have RW at home)    Recommendations for Other Services       Precautions / Restrictions Precautions Precautions: Fall;Sternal Precaution Comments: Reviewed sternal precautions with pt, pt was able to recall all precautions without cues. Restrictions Weight Bearing Restrictions: No      Mobility  Bed Mobility Overal bed mobility: Needs Assistance Bed Mobility: Supine to Sit     Supine to sit: Supervision     General bed mobility comments: Supervision and verbal cues to not push or pull with UEs due to sternal precautions.  +dizziness once sitting EOB which clears after ~3  minutes  Transfers Overall transfer level: Needs assistance Equipment used: Rolling walker (2 wheeled) Transfers: Sit to/from Stand Sit to Stand: Min assist         General transfer comment: Cues to remind pt of proper technique to avoid pushing or pulling with UEs.  Achieves standing on third attempt, pt rocking to use momentum to stand with posterior bias.    Ambulation/Gait Ambulation/Gait assistance: Min guard Ambulation Distance (Feet): 3 Feet Assistive device: Rolling walker (2 wheeled) Gait Pattern/deviations: Shuffle Gait velocity: decreased   General Gait Details: Pt takes a few steps from bed to chair as he reports +dizziness once standing EOB which improves but does not completely clear.  Close min guard for safety.  Stairs            Wheelchair Mobility    Modified Rankin (Stroke Patients Only)       Balance Overall balance assessment: Needs assistance Sitting-balance support: No upper extremity supported;Feet supported Sitting balance-Leahy Scale: Fair     Standing balance support: Bilateral upper extremity supported;During functional activity Standing balance-Leahy Scale: Fair                               Pertinent Vitals/Pain Pain Assessment: No/denies pain    Home Living Family/patient expects to be discharged to:: Private residence Living Arrangements: Spouse/significant other Available Help at Discharge: Family;Available 24 hours/day Type of Home: Mobile home Home Access: Ramped entrance     Home Layout: One level Home Equipment: [place] - single point;Shower seat  Prior Function Level of Independence: Independent with assistive device(s)         Comments: Was home one day before readmission, says he did not use his cane during this time.  Prior to CABG pt was using cane intermittently.     Hand Dominance   Dominant Hand: Right    Extremity/Trunk Assessment   Upper Extremity Assessment: Overall WFL for tasks  assessed           Lower Extremity Assessment: Overall WFL for tasks assessed      Cervical / Trunk Assessment: Normal  Communication   Communication: No difficulties  Cognition Arousal/Alertness: Awake/alert Behavior During Therapy: WFL for tasks assessed/performed Overall Cognitive Status: Within Functional Limits for tasks assessed                      General Comments General comments (skin integrity, edema, etc.): BP supine 103/81, sitting EOB 114/81, standing at bedside 121/76.  Pt symptomatic (+dizziness) with each positional change.    Exercises     Assessment/Plan    PT Assessment Patient needs continued PT services  PT Problem List Decreased activity tolerance;Decreased balance;Decreased knowledge of use of DME;Decreased safety awareness;Cardiopulmonary status limiting activity          PT Treatment Interventions DME instruction;Gait training;Functional mobility training;Therapeutic activities;Therapeutic exercise;Balance training;Patient/family education    PT Goals (Current goals can be found in the Care Plan section)  Acute Rehab PT Goals Patient Stated Goal: rehab before home PT Goal Formulation: With patient Time For Goal Achievement: 01/11/16 Potential to Achieve Goals: Good    Frequency Min 2X/week   Barriers to discharge Decreased caregiver support Wife able to provide supervision but unable to provide assist if needed as she ambualtes with rollator    Co-evaluation               End of Session Equipment Utilized During Treatment: Gait belt Activity Tolerance: Treatment limited secondary to medical complications (Comment) (+dizziness) Patient left: in chair;with call bell/phone within reach;with nursing/sitter in room;with chair alarm set Nurse Communication: Mobility status;Other (comment) (orthostatic BP readings)    Functional Assessment Tool Used: Clinical Judgement Functional Limitation: Mobility: Walking and moving  around Mobility: Walking and Moving Around Current Status [id number]): At least 1 percent but less than 20 percent impaired, limited or restricted Mobility: Walking and Moving Around Goal Status [phone]): At least 1 percent but less than 20 percent impaired, limited or restricted    Time: [id number] PT Time Calculation (min) (ACUTE ONLY): 28 min   Charges:   PT Evaluation $PT Eval Low Complexity: 1 Procedure PT Treatments $Therapeutic Activity: 8-22 mins   PT G Codes:   PT G-Codes **NOT FOR INPATIENT CLASS** Functional Assessment Tool Used: Clinical Judgement Functional Limitation: Mobility: Walking and moving around Mobility: Walking and Moving Around Current Status [id number]): At least 1 percent but less than 20 percent impaired, limited or restricted Mobility: Walking and Moving Around Goal Status [phone]): At least 1 percent but less than 20 percent impaired, limited or restricted    Collie Siad PT, DPT 01/01/2016, 9:34 AM

## 2016-01-01 NOTE — NC FL2 (Signed)
[place] MEDICAID FL2 LEVEL OF CARE SCREENING TOOL     IDENTIFICATION  Patient Name: Roger Pruitt Birthdate: [birth year] Sex: male Admission Date (Current Location): 12/31/2015  [facility] [place] Number:  Engineering geologist and Address:  [facility] [place] [place] [address] [place], [place] [address]      Provider Number: [id number]  Attending Physician Name and Address:  Loletha Grayer, MD  Relative Name and Phone Number:     [place], [place] [phone]  [phone]       Current Level of Care: Hospital Recommended Level of Care: [place] Prior Approval Number:    Date Approved/Denied:   PASRR Number:  Pending  Discharge Plan: SNF    Current Diagnoses: Patient Active Problem List   Diagnosis Date Noted  . Syncope 12/31/2015  . Status post coronary artery bypass grafting 12/31/2015  . S/P CABG x 4 12/25/2015  . Unstable angina ([place]) 12/01/2015  . Chest tightness or pressure 10/27/2015  . Hypertensive heart disease   . HLD (hyperlipidemia)   . Type II diabetes mellitus ([place])   . UTI (lower urinary tract infection) 07/20/2015  . Hyponatremia 07/20/2015  . Abnormal chest CT 07/10/2015  . Pulmonary nodules 07/09/2015  . Dyspnea 07/09/2015  . Basal cell carcinoma 05/27/2015  . Bipolar affective disorder ([place]) 05/27/2015  . CAD in native artery 05/27/2015  . Degeneration of intervertebral disc of lumbosacral region 05/27/2015  . GERD (gastroesophageal reflux disease) 05/27/2015  . Benign essential HTN 05/27/2015  . Apnea, sleep 05/27/2015  . 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 Benign prostatic hypertrophy (BPH) with incomplete bladder emptying 11/29/2014  . Acute encephalopathy 08/23/2014  . CVA (cerebral infarction) 08/23/2014  . Hypokalemia 08/23/2014  . Leukocytosis 08/23/2014  . Acute CVA (cerebrovascular accident) ([place]) 08/23/2014  . Hypertensive urgency 01/01/2013  . Hypertension   . Chest pain 09/30/2011  . CAD  (coronary artery disease) 09/30/2011  . S/P coronary artery stent placement 09/30/2011  . Hyperlipidemia 09/30/2011  . Diabetes mellitus ([place]) 09/30/2011  . H/O acute myocardial infarction 09/03/2011  . Allergic rhinitis, seasonal 09/03/2011    Orientation RESPIRATION BLADDER Height & Weight     Self, Time, Situation, Place  Normal Continent Weight: 209 lb 12.8 oz (95.2 kg) Height:  6\' 3"  (190.5 cm)  BEHAVIORAL SYMPTOMS/MOOD NEUROLOGICAL BOWEL NUTRITION STATUS      Continent  (Cardiac )  AMBULATORY STATUS COMMUNICATION OF NEEDS Skin   Limited Assist Verbally Normal                       Personal Care Assistance Level of Assistance  Bathing, Feeding, Dressing Bathing Assistance: Limited assistance Feeding assistance: Limited assistance Dressing Assistance: Limited assistance     Functional Limitations Info  Sight, Hearing, Speech Sight Info: Adequate Hearing Info: Adequate      SPECIAL CARE FACTORS FREQUENCY  PT (By licensed PT)     PT Frequency: 5x a week              Contractures Contractures Info: Not present    Additional Factors Info  Code Status, Allergies, Insulin Sliding Scale, Psychotropic Code Status Info: Full Allergies Info: MORPHINE AND RELATED, SYNALGOS-DC ASPIRIN-CAFF-DIHYDROCODEINE, SYNCHLOR A CHLORPHENIRAMINE  Psychotropic Info: buPROPion (WELLBUTRIN XL) 24 hr tablet 300 mg Insulin Sliding Scale Info: 3x a day       Current Medications (01/01/2016):  This is the current hospital active medication list Current Facility-Administered Medications  Medication Dose Route Frequency Provider Last Rate Last  Dose  . 0.9 %  sodium chloride infusion  250 mL Intravenous PRN Theodoro Grist, MD      . 0.9 %  sodium chloride infusion   Intravenous Continuous Loletha Grayer, MD      . acetaminophen (TYLENOL) tablet 1,000 mg  1,000 mg Oral Q6H PRN Theodoro Grist, MD      . aspirin EC tablet 325 mg  325 mg Oral Daily Theodoro Grist, MD   325 mg at 01/01/16  0917  . atorvastatin (LIPITOR) tablet 40 mg  40 mg Oral q1800 Theodoro Grist, MD   40 mg at 12/31/15 1712  . buPROPion (WELLBUTRIN XL) 24 hr tablet 300 mg  300 mg Oral Daily Theodoro Grist, MD   300 mg at 01/01/16 0917  . carbamazepine (TEGRETOL XR) 12 hr tablet 300 mg  300 mg Oral BID Theodoro Grist, MD   300 mg at 01/01/16 0917  . clopidogrel (PLAVIX) tablet 75 mg  75 mg Oral Daily Theodoro Grist, MD   75 mg at 01/01/16 0916  . docusate sodium (COLACE) capsule 100 mg  100 mg Oral BID Theodoro Grist, MD   100 mg at 01/01/16 0918  . doxepin (SINEQUAN) capsule 50 mg  50 mg Oral QHS Theodoro Grist, MD   50 mg at 12/31/15 2230  . finasteride (PROSCAR) tablet 5 mg  5 mg Oral Daily Theodoro Grist, MD   5 mg at 01/01/16 0917  . fluticasone (FLONASE) 50 MCG/ACT nasal spray 1 spray  1 spray Each Nare Daily Theodoro Grist, MD   1 spray at 01/01/16 0919  . gabapentin (NEURONTIN) capsule 300 mg  300 mg Oral TID Theodoro Grist, MD   300 mg at 01/01/16 0916  . insulin aspart (novoLOG) injection 0-5 Units  0-5 Units Subcutaneous QHS Theodoro Grist, MD      . insulin aspart (novoLOG) injection 0-9 Units  0-9 Units Subcutaneous TID WC Theodoro Grist, MD   1 Units at 12/31/15 1712  . insulin aspart (novoLOG) injection 4 Units  4 Units Subcutaneous TID WC Theodoro Grist, MD   4 Units at 01/01/16 1142  . LORazepam (ATIVAN) tablet 1-2 mg  1-2 mg Oral BID Theodoro Grist, MD   1 mg at 01/01/16 0917  . magnesium oxide (MAG-OX) tablet 400 mg  400 mg Oral BID Theodoro Grist, MD   400 mg at 01/01/16 0918  . metFORMIN (GLUCOPHAGE) tablet 1,000 mg  1,000 mg Oral BID WC Theodoro Grist, MD   1,000 mg at 12/31/15 1712  . ondansetron (ZOFRAN) tablet 4 mg  4 mg Oral Q6H PRN Theodoro Grist, MD       Or  . ondansetron (ZOFRAN) injection 4 mg  4 mg Intravenous Q6H PRN Theodoro Grist, MD      . paliperidone (INVEGA) 24 hr tablet 6 mg  6 mg Oral QHS Theodoro Grist, MD   6 mg at 12/31/15 2230  . pantoprazole (PROTONIX) EC tablet 40 mg  40 mg Oral Daily  Theodoro Grist, MD   40 mg at 01/01/16 0918  . potassium chloride SA (K-DUR,KLOR-CON) CR tablet 20 mEq  20 mEq Oral BID Theodoro Grist, MD   20 mEq at 01/01/16 0916  . sodium chloride flush (NS) 0.9 % injection 3 mL  3 mL Intravenous Q12H Theodoro Grist, MD      . sodium chloride flush (NS) 0.9 % injection 3 mL  3 mL Intravenous Q12H Theodoro Grist, MD   3 mL at 01/01/16 1000  . sodium chloride flush (NS) 0.9 %  injection 3 mL  3 mL Intravenous PRN Theodoro Grist, MD      . topiramate (TOPAMAX) tablet 100 mg  100 mg Oral TID Theodoro Grist, MD   100 mg at 01/01/16 1148  . traMADol (ULTRAM) tablet 50 mg  50 mg Oral Q6H PRN Theodoro Grist, MD         Discharge Medications: Please see discharge summary for a list of discharge medications.  Relevant Imaging Results:  Relevant Lab Results:   Additional Information SSN [id number]  Ross Ludwig

## 2016-01-02 DIAGNOSIS — G20 Parkinson's disease: Secondary | ICD-10-CM | POA: Diagnosis not present

## 2016-01-02 DIAGNOSIS — I2510 Atherosclerotic heart disease of native coronary artery without angina pectoris: Secondary | ICD-10-CM | POA: Diagnosis not present

## 2016-01-02 DIAGNOSIS — I951 Orthostatic hypotension: Secondary | ICD-10-CM | POA: Diagnosis not present

## 2016-01-02 DIAGNOSIS — R55 Syncope and collapse: Secondary | ICD-10-CM | POA: Diagnosis not present

## 2016-01-02 LAB — BASIC METABOLIC PANEL
Anion gap: 6 (ref 5–15)
BUN: 16 mg/dL (ref 6–20)
CHLORIDE: 110 mmol/L (ref 101–111)
CO2: 23 mmol/L (ref 22–32)
Calcium: 8.8 mg/dL — ABNORMAL LOW (ref 8.9–10.3)
Creatinine, Ser: 0.87 mg/dL (ref 0.61–1.24)
GFR calc Af Amer: >60 mL/min (ref >60)
GFR calc non Af Amer: >60 mL/min (ref >60)
Glucose, Bld: 104 mg/dL — ABNORMAL HIGH (ref 65–99)
Potassium: 4 mmol/L (ref 3.5–5.1)
SODIUM: 139 mmol/L (ref 135–145)

## 2016-01-02 LAB — GLUCOSE, CAPILLARY
GLUCOSE-CAPILLARY: 105 mg/dL — AB (ref 65–99)
GLUCOSE-CAPILLARY: 101 mg/dL — AB (ref 65–99)
GLUCOSE-CAPILLARY: 109 mg/dL — AB (ref 65–99)
Glucose-Capillary: 101 mg/dL — ABNORMAL HIGH (ref 65–99)

## 2016-01-02 MED ORDER — CARVEDILOL 3.125 MG PO TABS
3.1250 mg | ORAL_TABLET | Freq: Two times a day (BID) | ORAL | Status: DC
Start: 2016-01-02 — End: 2016-01-04
  Administered 2016-01-02 – 2016-01-03 (×4): 3.125 mg via ORAL
  Filled 2016-01-02 (×4): qty 1

## 2016-01-02 MED ORDER — ENOXAPARIN SODIUM 40 MG/0.4ML ~~LOC~~ SOLN
40.0000 mg | SUBCUTANEOUS | Status: DC
  Administered 2016-01-02 – 2016-01-04 (×3): 40 mg via SUBCUTANEOUS
  Filled 2016-01-02 (×3): qty 0.4

## 2016-01-02 NOTE — Progress Notes (Signed)
[facility] [place] NAME: Roger Pruitt    MR#:  [id number]  DATE OF BIRTH:  1950-11-04  SUBJECTIVE:  CHIEF COMPLAINT:   Chief Complaint  Patient presents with  . Loss of Consciousness   - dizziness improved, feels better - but weaker overall  REVIEW OF SYSTEMS:  Review of Systems  Constitutional: Positive for malaise/fatigue. Negative for chills and fever.  HENT: Negative for ear discharge, ear pain and nosebleeds.   Eyes: Negative for blurred vision and double vision.  Respiratory: Negative for cough, shortness of breath and wheezing.   Cardiovascular: Negative for chest pain, palpitations and leg swelling.  Gastrointestinal: Negative for abdominal pain, constipation, diarrhea, nausea and vomiting.  Genitourinary: Negative for dysuria and urgency.  Musculoskeletal: Negative for myalgias.  Neurological: Positive for dizziness. Negative for tremors, sensory change, speech change, seizures and headaches.  Psychiatric/Behavioral: Negative for depression.    DRUG ALLERGIES:   Allergies  Allergen Reactions  . Morphine And Related Nausea And Vomiting  . Synalgos-Dc [Aspirin-Caff-Dihydrocodeine] Itching and Other (See Comments)    Reaction:  Stinging   . Synchlor A [Chlorpheniramine] Rash    Patient doesn't recall this allergy.    VITALS:  Blood pressure 132/83, pulse 99, temperature 97.6 F (36.4 C), temperature source Oral, resp. rate 18, height 6\' 3"  (1.905 m), weight 95.2 kg (209 lb 12.8 oz), SpO2 100 %.  PHYSICAL EXAMINATION:  Physical Exam  GENERAL:  65 y.o.-year-old patient lying in the bed with no acute distress.  EYES: Pupils equal, round, reactive to light and accommodation. No scleral icterus. Extraocular muscles intact.  HEENT: Head atraumatic, normocephalic. Oropharynx and nasopharynx clear.  NECK:  Supple, no jugular venous distention. No thyroid enlargement, no tenderness.  LUNGS: Normal breath sounds bilaterally,  no wheezing, rales,rhonchi or crepitation. No use of accessory muscles of respiration.  CARDIOVASCULAR: S1, S2 normal. No  rubs, or gallops. CABG scar healing. 2/6 systolic murmur present. ABDOMEN: Soft, nontender, nondistended. Bowel sounds present. No organomegaly or mass.  EXTREMITIES: No pedal edema, cyanosis, or clubbing.  NEUROLOGIC: Cranial nerves II through XII are intact. Muscle strength 5/5 in all extremities. Sensation intact. Gait not checked.  PSYCHIATRIC: The patient is alert and oriented x 3.  SKIN: No obvious rash, lesion, or ulcer.    LABORATORY PANEL:   CBC  Recent Labs Lab 01/01/16 0350  WBC 7.0  HGB 8.8*  HCT 25.3*  PLT 234   ------------------------------------------------------------------------------------------------------------------  Chemistries   Recent Labs Lab 12/26/15 1701  12/31/15 1240  01/02/16 0248  NA  --   < > 138  < > 139  K  --   < > 4.0  < > 4.0  CL  --   < > 106  < > 110  CO2  --   < > 23  < > 23  GLUCOSE  --   < > 112*  < > 104*  BUN  --   < > 16  < > 16  CREATININE 0.95  < > 0.95  < > 0.87  CALCIUM  --   < > 8.9  < > 8.8*  MG 1.8  --   --   --   --   AST  --   --  20  --   --   ALT  --   --  21  --   --   ALKPHOS  --   --  44  --   --  BILITOT  --   --  0.7  --   --   < > = values in this interval not displayed. ------------------------------------------------------------------------------------------------------------------  Cardiac Enzymes  Recent Labs Lab 01/01/16 0350  TROPONINI 0.22*   ------------------------------------------------------------------------------------------------------------------  RADIOLOGY:  Dg Chest 2 View  Result Date: 12/31/2015 CLINICAL DATA:  65 year old male with general weakness and dizziness now 1 week status post 4 vessel CABG. EXAM: CHEST  2 VIEW COMPARISON:  Prior chest x-ray 12/28/2015 FINDINGS: Stable cardiac and mediastinal contours. Patient is status post median sternotomy with  evidence of multivessel CABG including LIMA bypass. Significantly improved bibasilar atelectasis compared to prior. There are persistent small bilateral layering pleural effusions. The lungs are clear without evidence of pulmonary edema or consolidation. No pneumothorax. No acute osseous abnormality. IMPRESSION: 1. Improved appearance of the chest compared to 12/28/2015 with resolution of bibasilar atelectasis. 2. Persistent but improving small bilateral layering pleural effusions. 3. No evidence of acute cardiopulmonary process. Electronically Signed   By: Jacqulynn Cadet M.D.   On: 12/31/2015 13:12    EKG:   Orders placed or performed during the hospital encounter of 12/31/15  . EKG 12-Lead  . EKG 12-Lead  . ED EKG  . ED EKG    ASSESSMENT AND PLAN:   65y/o M with PMH of CAD recent CABG last week, anemia, HTN, diabetes,GERD, hyperlipidemia admitted with syncope, dizziness.  #1 Syncope, dizziness- orthostatic changes, hypotension, deconditioning - needs rehab, Can stop fluids today as blood pressure is better. - Improving  #2 CAD-status post recent CABG. Appreciate cardiology consult. -Restarted low-dose Coreg today. Continue to hold lisinopril. -Continue aspirin, Plavix and statin. -Recommend rehabilitation.   #3 neuropathy-on gabapentin.  #4 depression and anxiety-on Wellbutrin, Tegretol, doxepin and in [place] Recent diagnosis of possible Parkinson's disease. Being monitored by a neurologist  #5 diabetes mellitus-hold Januvia. Continue metformin. Sugars in the low normal range. -Added sliding scale insulin  #6 DVT prophylaxis-on Lovenox  Physical Therapy recommended rehab   All the records are reviewed and case discussed with Care Management/Social Workerr. Management plans discussed with the patient, family and they are in agreement.  CODE STATUS: Full Code  TOTAL TIME TAKING CARE OF THIS PATIENT: 37 minutes.   POSSIBLE D/C IN 1-2 DAYS, DEPENDING ON CLINICAL  CONDITION.   Gladstone Lighter M.D on 01/02/2016 at 11:33 AM  Between 7am to 6pm - Pager - [phone]  After 6pm go to [email or web address] - password [email or web address] [place] [facility]  Office  [phone]  CC: Primary care physician; Casilda Carls, MD

## 2016-01-02 NOTE — Progress Notes (Signed)
Pt is alert and oriented. No complaints of pain during the night. No complaints of being lightheaded or dizzy. Voiding without difficulty. Able to sleep in between care.

## 2016-01-02 NOTE — Progress Notes (Signed)
Notified Dr. Tressia Miners that patients incision had a small amount of serous drainage and a small gauze was placed on it.  She just acknowledged, now new orders.

## 2016-01-03 DIAGNOSIS — I951 Orthostatic hypotension: Secondary | ICD-10-CM | POA: Diagnosis not present

## 2016-01-03 DIAGNOSIS — R55 Syncope and collapse: Secondary | ICD-10-CM | POA: Diagnosis not present

## 2016-01-03 DIAGNOSIS — I2510 Atherosclerotic heart disease of native coronary artery without angina pectoris: Secondary | ICD-10-CM | POA: Diagnosis not present

## 2016-01-03 DIAGNOSIS — G20 Parkinson's disease: Secondary | ICD-10-CM | POA: Diagnosis not present

## 2016-01-03 LAB — BASIC METABOLIC PANEL
Anion gap: 7 (ref 5–15)
BUN: 15 mg/dL (ref 6–20)
CALCIUM: 8.6 mg/dL — AB (ref 8.9–10.3)
CO2: 23 mmol/L (ref 22–32)
CREATININE: 0.92 mg/dL (ref 0.61–1.24)
Chloride: 104 mmol/L (ref 101–111)
GFR calc non Af Amer: >60 mL/min (ref >60)
Glucose, Bld: 106 mg/dL — ABNORMAL HIGH (ref 65–99)
Potassium: 3.6 mmol/L (ref 3.5–5.1)
SODIUM: 134 mmol/L — AB (ref 135–145)

## 2016-01-03 LAB — GLUCOSE, CAPILLARY
GLUCOSE-CAPILLARY: 122 mg/dL — AB (ref 65–99)
GLUCOSE-CAPILLARY: 123 mg/dL — AB (ref 65–99)
Glucose-Capillary: 115 mg/dL — ABNORMAL HIGH (ref 65–99)
Glucose-Capillary: 110 mg/dL — ABNORMAL HIGH (ref 65–99)

## 2016-01-03 MED ORDER — POLYETHYLENE GLYCOL 3350 17 G PO PACK: 17.0000 g | PACK | Freq: Every day | ORAL | Status: DC | PRN

## 2016-01-03 MED ORDER — SENNA 8.6 MG PO TABS
1.0000 | ORAL_TABLET | Freq: Every day | ORAL | Status: DC
  Administered 2016-01-03 – 2016-01-04 (×2): 8.6 mg via ORAL
  Filled 2016-01-03 (×2): qty 1

## 2016-01-03 NOTE — Progress Notes (Signed)
NSR. Room air. Takes meds ok. Up to chair and tolerated it well. Dressing to sternum was changed. FS are stable. Urinal. Pt has no further concerns at this time.

## 2016-01-03 NOTE — Progress Notes (Signed)
Pt. Slept throughout the night with no signs or c/o pain, SOB or acute distress noted.  

## 2016-01-03 NOTE — Progress Notes (Signed)
[facility] [place] NAME: Donivin Kosterman    MR#:  [id number]  DATE OF BIRTH:  01/08/1951  SUBJECTIVE:  CHIEF COMPLAINT:   Chief Complaint  Patient presents with  . Loss of Consciousness   -feels better, serous discharge oozing through the CABG scar- no focal erythema or tenderness noted.  REVIEW OF SYSTEMS:  Review of Systems  Constitutional: Negative for chills, fever and malaise/fatigue.  HENT: Negative for ear discharge, ear pain and nosebleeds.   Eyes: Negative for blurred vision and double vision.  Respiratory: Negative for cough, shortness of breath and wheezing.   Cardiovascular: Negative for chest pain, palpitations and leg swelling.  Gastrointestinal: Positive for constipation. Negative for abdominal pain, diarrhea, nausea and vomiting.  Genitourinary: Negative for dysuria and urgency.  Musculoskeletal: Negative for myalgias.  Neurological: Negative for dizziness, tremors, sensory change, speech change, seizures and headaches.  Psychiatric/Behavioral: Negative for depression.    DRUG ALLERGIES:   Allergies  Allergen Reactions  . Morphine And Related Nausea And Vomiting  . Synalgos-Dc [Aspirin-Caff-Dihydrocodeine] Itching and Other (See Comments)    Reaction:  Stinging   . Synchlor A [Chlorpheniramine] Rash    Patient doesn't recall this allergy.    VITALS:  Blood pressure 123/68, pulse 95, temperature 97.8 F (36.6 C), temperature source Oral, resp. rate 18, height 6\' 3"  (1.905 m), weight 95.2 kg (209 lb 12.8 oz), SpO2 97 %.  PHYSICAL EXAMINATION:  Physical Exam  GENERAL:  65 y.o.-year-old patient lying in the bed with no acute distress.  EYES: Pupils equal, round, reactive to light and accommodation. No scleral icterus. Extraocular muscles intact.  HEENT: Head atraumatic, normocephalic. Oropharynx and nasopharynx clear.  NECK:  Supple, no jugular venous distention. No thyroid enlargement, no tenderness.  LUNGS:  Normal breath sounds bilaterally, no wheezing, rales,rhonchi or crepitation. No use of accessory muscles of respiration.  CARDIOVASCULAR: S1, S2 normal. No  rubs, or gallops.. 2/6 systolic murmur present.  CABG scar healing, but serous drainage, no gaping in the scar, no fluid collection noted. ABDOMEN: Soft, nontender, nondistended. Bowel sounds present. No organomegaly or mass.  EXTREMITIES: No pedal edema, cyanosis, or clubbing.  NEUROLOGIC: Cranial nerves II through XII are intact. Muscle strength 5/5 in all extremities. Sensation intact. Gait not checked.  PSYCHIATRIC: The patient is alert and oriented x 3.  SKIN: No obvious rash, lesion, or ulcer.    LABORATORY PANEL:   CBC  Recent Labs Lab 01/01/16 0350  WBC 7.0  HGB 8.8*  HCT 25.3*  PLT 234   ------------------------------------------------------------------------------------------------------------------  Chemistries   Recent Labs Lab 12/31/15 1240  01/03/16 0433  NA 138  < > 134*  K 4.0  < > 3.6  CL 106  < > 104  CO2 23  < > 23  GLUCOSE 112*  < > 106*  BUN 16  < > 15  CREATININE 0.95  < > 0.92  CALCIUM 8.9  < > 8.6*  AST 20  --   --   ALT 21  --   --   ALKPHOS 44  --   --   BILITOT 0.7  --   --   < > = values in this interval not displayed. ------------------------------------------------------------------------------------------------------------------  Cardiac Enzymes  Recent Labs Lab 01/01/16 0350  TROPONINI 0.22*   ------------------------------------------------------------------------------------------------------------------  RADIOLOGY:  No results found.  EKG:   Orders placed or performed during the hospital encounter of 12/31/15  . EKG 12-Lead  . EKG 12-Lead  .  ED EKG  . ED EKG    ASSESSMENT AND PLAN:   65y/o M with PMH of CAD recent CABG last week, anemia, HTN, diabetes,GERD, hyperlipidemia admitted with syncope, dizziness.  #1 Syncope, dizziness- orthostatic changes,  hypotension, deconditioning - needs rehab, stopped fluids as blood pressure is better. - Improving  #2 CAD-status post recent CABG. Appreciate cardiology consult. -Restarted low-dose Coreg. Continue to hold lisinopril. -Continue aspirin, Plavix and statin. -Recommend rehabilitation. - minimal serous drainage through CABG scar, no infection noted, outpt follow up with cardiothoracic surgeon next week.   #3 neuropathy-on gabapentin.  #4 depression and anxiety-on Wellbutrin, Tegretol, doxepin and inVega Recent diagnosis of possible Parkinson's disease. Being monitored by a neurologist  #5 diabetes mellitus-hold Januvia. Continue metformin. Sugars in the normal range. -Added sliding scale insulin  #6 DVT prophylaxis-on Lovenox  Physical Therapy recommended rehab Likely discharge to rehab tomorrow   All the records are reviewed and case discussed with Care Management/Social Workerr. Management plans discussed with the patient, family and they are in agreement.  CODE STATUS: Full Code  TOTAL TIME TAKING CARE OF THIS PATIENT: 37 minutes.   POSSIBLE D/C TOMORROW, DEPENDING ON CLINICAL CONDITION.   Gladstone Lighter M.D on 01/03/2016 at 8:32 AM  Between 7am to 6pm - Pager - [phone]  After 6pm go to [email or web address] - password [email or web address] [facility]  Office  [phone]  CC: Primary care physician; Casilda Carls, MD

## 2016-01-03 NOTE — Progress Notes (Signed)
Mid line chest incision continues to drain a small amount of serosanguineous drainage, incision cleaned with NS and foam dressing applied. Prior dressing saturated. No redness, edema or odor noted noted to incision line. Surrounding tissue intact.

## 2016-01-03 NOTE — Progress Notes (Signed)
AMbulated pt around the nurses station but unable to go more than 200 ft. Pt states his legs got heavy. Pt has no further concerns at this time.

## 2016-01-04 DIAGNOSIS — Z48812 Encounter for surgical aftercare following surgery on the circulatory system: Secondary | ICD-10-CM | POA: Diagnosis not present

## 2016-01-04 DIAGNOSIS — F419 Anxiety disorder, unspecified: Secondary | ICD-10-CM | POA: Diagnosis not present

## 2016-01-04 DIAGNOSIS — R531 Weakness: Secondary | ICD-10-CM | POA: Diagnosis not present

## 2016-01-04 DIAGNOSIS — E860 Dehydration: Secondary | ICD-10-CM | POA: Diagnosis not present

## 2016-01-04 DIAGNOSIS — R55 Syncope and collapse: Secondary | ICD-10-CM | POA: Diagnosis not present

## 2016-01-04 DIAGNOSIS — I10 Essential (primary) hypertension: Secondary | ICD-10-CM | POA: Diagnosis not present

## 2016-01-04 DIAGNOSIS — F319 Bipolar disorder, unspecified: Secondary | ICD-10-CM | POA: Diagnosis not present

## 2016-01-04 DIAGNOSIS — K219 Gastro-esophageal reflux disease without esophagitis: Secondary | ICD-10-CM | POA: Diagnosis not present

## 2016-01-04 DIAGNOSIS — R41841 Cognitive communication deficit: Secondary | ICD-10-CM | POA: Diagnosis not present

## 2016-01-04 DIAGNOSIS — D649 Anemia, unspecified: Secondary | ICD-10-CM | POA: Diagnosis not present

## 2016-01-04 DIAGNOSIS — I959 Hypotension, unspecified: Secondary | ICD-10-CM | POA: Diagnosis not present

## 2016-01-04 DIAGNOSIS — R1319 Other dysphagia: Secondary | ICD-10-CM | POA: Diagnosis not present

## 2016-01-04 DIAGNOSIS — I2510 Atherosclerotic heart disease of native coronary artery without angina pectoris: Secondary | ICD-10-CM | POA: Diagnosis not present

## 2016-01-04 DIAGNOSIS — G20 Parkinson's disease: Secondary | ICD-10-CM | POA: Diagnosis not present

## 2016-01-04 DIAGNOSIS — R42 Dizziness and giddiness: Secondary | ICD-10-CM | POA: Diagnosis not present

## 2016-01-04 DIAGNOSIS — E119 Type 2 diabetes mellitus without complications: Secondary | ICD-10-CM | POA: Diagnosis not present

## 2016-01-04 DIAGNOSIS — R471 Dysarthria and anarthria: Secondary | ICD-10-CM | POA: Diagnosis not present

## 2016-01-04 DIAGNOSIS — M6281 Muscle weakness (generalized): Secondary | ICD-10-CM | POA: Diagnosis not present

## 2016-01-04 DIAGNOSIS — I951 Orthostatic hypotension: Secondary | ICD-10-CM | POA: Diagnosis not present

## 2016-01-04 DIAGNOSIS — N401 Enlarged prostate with lower urinary tract symptoms: Secondary | ICD-10-CM | POA: Diagnosis not present

## 2016-01-04 DIAGNOSIS — R778 Other specified abnormalities of plasma proteins: Secondary | ICD-10-CM | POA: Diagnosis not present

## 2016-01-04 DIAGNOSIS — Z743 Need for continuous supervision: Secondary | ICD-10-CM | POA: Diagnosis not present

## 2016-01-04 LAB — GLUCOSE, CAPILLARY
Glucose-Capillary: 116 mg/dL — ABNORMAL HIGH (ref 65–99)
Glucose-Capillary: 97 mg/dL (ref 65–99)

## 2016-01-04 MED ORDER — TRAMADOL HCL 50 MG PO TABS: 50.0000 mg | ORAL_TABLET | Freq: Four times a day (QID) | ORAL | 0 refills | Status: DC | PRN

## 2016-01-04 MED ORDER — BISACODYL 5 MG PO TBEC
10.0000 mg | DELAYED_RELEASE_TABLET | Freq: Once | ORAL | Status: AC
  Administered 2016-01-04: 10 mg via ORAL
  Filled 2016-01-04: qty 2

## 2016-01-04 MED ORDER — CARVEDILOL 3.125 MG PO TABS: 3.1250 mg | ORAL_TABLET | Freq: Two times a day (BID) | ORAL | 2 refills | Status: DC

## 2016-01-04 MED ORDER — DOCUSATE SODIUM 100 MG PO CAPS
100.0000 mg | ORAL_CAPSULE | Freq: Two times a day (BID) | ORAL | 0 refills | Status: DC
Start: 2016-01-04 — End: 2016-03-01

## 2016-01-04 MED ORDER — LORAZEPAM 1 MG PO TABS: 1.0000 mg | ORAL_TABLET | Freq: Two times a day (BID) | ORAL | 0 refills | Status: DC

## 2016-01-04 NOTE — Discharge Summary (Signed)
[facility] [place] NAME: Roger Pruitt    MR#:  [id number]  DATE OF BIRTH:  Jul 18, 1950  DATE OF ADMISSION:  12/31/2015   ADMITTING PHYSICIAN: Theodoro Grist, MD  DATE OF DISCHARGE: 01/04/16  PRIMARY CARE PHYSICIAN: Casilda Carls, MD   ADMISSION DIAGNOSIS:   Syncope, unspecified syncope type [R55]  DISCHARGE DIAGNOSIS:   Principal Problem:   Syncope Active Problems:   Status post coronary artery bypass grafting   SECONDARY DIAGNOSIS:   Past Medical History:  Diagnosis Date  . Abdominal pain   . Acute cystitis without hematuria   . Anemia   . Anginal pain ([place])   . Arthritis   . Bipolar disorder ([place])    BIPOLAR/ SCHIZO/ AFFECTIVE   . BPD (bronchopulmonary dysplasia)   . BPH (benign prostatic hyperplasia)   . CAD (coronary artery disease)    a. s/p MI 2001, 2012;  b. s/p prior LCX stenting; b. 05/2012 Abnl MV; c. 05/2012 Cath: patent LCX stent w/ otw mod non-obs dzs->Med Rx.  . Carpal tunnel syndrome   . Disc degeneration, lumbar   . Elevated PSA   . Esophageal reflux   . GERD (gastroesophageal reflux disease)   . Hematuria    history of gross and microscopic  . Hemorrhoid   . History of balanitis   . History of bladder infections   . History of echocardiogram    a. 07/2014 Echo: EF 60-65%, no rwma, nl LA size, nl RV/PASP.  Marland Kitchen History of urethral stricture   . Hyperlipidemia   . Hypertension   . Hypertensive heart disease   . Incomplete bladder emptying   . Iron deficiency anemia, unspecified   . Lower urinary tract symptoms   . Myocardial infarction    X7  . Neuropathy ([place])   . Nocturia   . Obsessive compulsive disorder   . Other dysphagia   . Panic attack   . Parkinson's disease ([place])   . Sleep apnea    SURGICAL TX  . Stroke ([place])   . Type II diabetes mellitus ([place])   . Unspecified vitamin D deficiency   . Urethral stricture   . Urinary retention   . Varicose vein of leg   . Vitamin D deficiency      HOSPITAL COURSE:   65y/o M with PMH of CAD recent CABG last week, anemia, HTN, diabetes,GERD, hyperlipidemia admitted with syncope, dizziness.  #1 Syncope, dizziness- orthostatic changes, hypotension, deconditioning - needs rehab, stopped fluids as blood pressure is better. - Improving - stopped BP meds and also diabetic meds except metformin as sugars low as well  #2 CAD-status post recent CABG. Appreciate cardiology consult. -Restarted low-dose Coreg. Continue to hold lisinopril. -Continue aspirin, Plavix and statin. -Recommend rehabilitation. - minimal serous drainage through CABG scar, no infection noted, outpt follow up with cardiothoracic surgeon next week.   #3 neuropathy-on gabapentin.  #4 depression and anxiety-on Wellbutrin, Tegretol, doxepin and inVega Recent diagnosis of possible Parkinson's disease. Being monitored by a neurologist  #5 diabetes mellitus-discontinue Januvia. Continue metformin. Sugars in the normal range.   Physical Therapy recommended rehab Likely discharge to rehab today   DISCHARGE CONDITIONS:   Guarded  CONSULTS OBTAINED:   Treatment Team:  Teodoro Spray, MD Gladstone Lighter, MD Corey Skains, MD  DRUG ALLERGIES:   Allergies  Allergen Reactions  . Morphine And Related Nausea And Vomiting  . Synalgos-Dc [Aspirin-Caff-Dihydrocodeine] Itching and Other (See Comments)    Reaction:  Stinging   .  Synchlor A [Chlorpheniramine] Rash    Patient doesn't recall this allergy.   DISCHARGE MEDICATIONS:     Medication List    STOP taking these medications   ACCU-CHEK AVIVA PLUS test strip Generic drug:  glucose blood   B-D SINGLE USE SWABS REGULAR Pads   chlorthalidone 25 MG tablet Commonly known as:  HYGROTON   JANUMET 50-1000 MG tablet Generic drug:  sitaGLIPtin-metformin   lisinopril 40 MG tablet Commonly known as:  PRINIVIL,ZESTRIL   potassium chloride SA 20 MEQ tablet Commonly known as:  K-DUR,KLOR-CON    sitaGLIPtin 100 MG tablet Commonly known as:  JANUVIA     TAKE these medications   acetaminophen 500 MG tablet Commonly known as:  TYLENOL Take 1,000 mg by mouth every 6 (six) hours as needed (pain).   aspirin 325 MG EC tablet Take 1 tablet (325 mg total) by mouth daily.   atorvastatin 40 MG tablet Commonly known as:  LIPITOR Take 1 tablet (40 mg total) by mouth daily at 6 PM.   buPROPion 300 MG 24 hr tablet Commonly known as:  WELLBUTRIN XL Take 300 mg by mouth daily. Takes at noon.   Carbamazepine 300 MG Cp12 Commonly known as:  Equetro Take 300 mg by mouth 2 (two) times daily.   carvedilol 3.125 MG tablet Commonly known as:  COREG Take 1 tablet (3.125 mg total) by mouth 2 (two) times daily with a meal. What changed:  medication strength  how much to take  when to take this   clopidogrel 75 MG tablet Commonly known as:  PLAVIX Take 75 mg by mouth daily.   docusate sodium 100 MG capsule Commonly known as:  COLACE Take 1 capsule (100 mg total) by mouth 2 (two) times daily.   doxepin 25 MG capsule Commonly known as:  SINEQUAN Take 50 mg by mouth at bedtime.   finasteride 5 MG tablet Commonly known as:  PROSCAR Take 5 mg by mouth daily.   fluticasone 50 MCG/ACT nasal spray Commonly known as:  FLONASE Place 1 spray into both nostrils daily.   gabapentin 300 MG capsule Commonly known as:  NEURONTIN Take 300 mg by mouth 3 (three) times daily.   LORazepam 1 MG tablet Commonly known as:  ATIVAN Take 1-2 tablets (1-2 mg total) by mouth 2 (two) times daily. Pt takes one tablet in the morning and two at bedtime.   magnesium oxide 400 (241.3 Mg) MG tablet Commonly known as:  MAG-OX Take 400 mg by mouth 2 (two) times daily.   metFORMIN 1000 MG tablet Commonly known as:  GLUCOPHAGE Take 1,000 mg by mouth 2 (two) times daily with a meal.   paliperidone 6 MG 24 hr tablet Commonly known as:  INVEGA Take 6 mg by mouth at bedtime.   pantoprazole 40 MG  tablet Commonly known as:  PROTONIX Take 40 mg by mouth daily. Takes at noon.   topiramate 100 MG tablet Commonly known as:  TOPAMAX Take 100 mg by mouth 3 (three) times daily. Takes at noon.   traMADol 50 MG tablet Commonly known as:  ULTRAM Take 1 tablet (50 mg total) by mouth every 6 (six) hours as needed.        DISCHARGE INSTRUCTIONS:   1. F/u with cardiothoracic surgeon in 1 week 2. PCP f/u in 1 week 3. Cardiology f/u in 3 weeks  DIET:   Cardiac diet  ACTIVITY:   Activity as tolerated  OXYGEN:   Home Oxygen: No.  Oxygen Delivery: room air  DISCHARGE LOCATION:   nursing home   If you experience worsening of your admission symptoms, develop shortness of breath, life threatening emergency, suicidal or homicidal thoughts you must seek medical attention immediately by calling 911 or calling your MD immediately  if symptoms less severe.  You Must read complete instructions/literature along with all the possible adverse reactions/side effects for all the Medicines you take and that have been prescribed to you. Take any new Medicines after you have completely understood and accpet all the possible adverse reactions/side effects.   Please note  You were cared for by a hospitalist during your hospital stay. If you have any questions about your discharge medications or the care you received while you were in the hospital after you are discharged, you can call the unit and asked to speak with the hospitalist on call if the hospitalist that took care of you is not available. Once you are discharged, your primary care physician will handle any further medical issues. Please note that NO REFILLS for any discharge medications will be authorized once you are discharged, as it is imperative that you return to your primary care physician (or establish a relationship with a primary care physician if you do not have one) for your aftercare needs so that they can reassess your need for  medications and monitor your lab values.    On the day of Discharge:  VITAL SIGNS:   Blood pressure 105/78, pulse 88, temperature 97.8 F (36.6 C), temperature source Oral, resp. rate 16, height 6\' 3"  (1.905 m), weight 95.2 kg (209 lb 12.8 oz), SpO2 98 %.  PHYSICAL EXAMINATION:    GENERAL:  65 y.o.-year-old patient lying in the bed with no acute distress.  EYES: Pupils equal, round, reactive to light and accommodation. No scleral icterus. Extraocular muscles intact.  HEENT: Head atraumatic, normocephalic. Oropharynx and nasopharynx clear.  NECK:  Supple, no jugular venous distention. No thyroid enlargement, no tenderness.  LUNGS: Normal breath sounds bilaterally, no wheezing, rales,rhonchi or crepitation. No use of accessory muscles of respiration.  CARDIOVASCULAR: S1, S2 normal. No  rubs, or gallops.. 2/6 systolic murmur present.  CABG scar healing, but serous drainage, no gaping in the scar, no fluid collection noted. ABDOMEN: Soft, nontender, nondistended. Bowel sounds present. No organomegaly or mass.  EXTREMITIES: No pedal edema, cyanosis, or clubbing.  NEUROLOGIC: Cranial nerves II through XII are intact. Muscle strength 5/5 in all extremities. Sensation intact. Gait not checked.  PSYCHIATRIC: The patient is alert and oriented x 3.  SKIN: No obvious rash, lesion, or ulcer.   DATA REVIEW:   CBC  Recent Labs Lab 01/01/16 0350  WBC 7.0  HGB 8.8*  HCT 25.3*  PLT 234    Chemistries   Recent Labs Lab 12/31/15 1240  01/03/16 0433  NA 138  < > 134*  K 4.0  < > 3.6  CL 106  < > 104  CO2 23  < > 23  GLUCOSE 112*  < > 106*  BUN 16  < > 15  CREATININE 0.95  < > 0.92  CALCIUM 8.9  < > 8.6*  AST 20  --   --   ALT 21  --   --   ALKPHOS 44  --   --   BILITOT 0.7  --   --   < > = values in this interval not displayed.   Microbiology Results  Results for orders placed or performed during the hospital encounter of 12/22/15  Surgical pcr screen  Status: None    Collection Time: 12/22/15 10:00 AM  Result Value Ref Range Status   MRSA, PCR NEGATIVE NEGATIVE Final   Staphylococcus aureus NEGATIVE NEGATIVE Final    Comment:        The Xpert SA Assay (FDA approved for NASAL specimens in patients over 9 years of age), is one component of a comprehensive surveillance program.  Test performance has been validated by [facility] for patients greater than or equal to 45 year old. It is not intended to diagnose infection nor to guide or monitor treatment.     RADIOLOGY:  No results found.   Management plans discussed with the patient, family and they are in agreement.  CODE STATUS:     Code Status Orders        Start     Ordered   12/31/15 1548  Full code  Continuous     12/31/15 1548    Code Status History    Date Active Date Inactive Code Status Order ID Comments User Context   12/25/2015  1:32 PM 12/30/2015  4:40 PM Full Code [id number]  John Giovanni, PA-C Inpatient   07/28/2015 11:29 PM 07/30/2015  9:21 PM Full Code [id number]  Harrie Foreman, MD Inpatient   07/21/2015 12:29 AM 07/23/2015  5:33 PM Full Code [id number]  Lance Coon, MD Inpatient   08/23/2014  4:52 PM 08/24/2014  4:09 PM Full Code [id number]  Demetrios Loll, MD Inpatient   01/01/2013  4:19 AM 01/01/2013  8:29 PM Full Code [id number]  Toy Baker, MD Inpatient    Advance Directive Documentation   Flowsheet Row Most Recent Value  Type of Advance Directive  Healthcare Power of Attorney, Living will  Pre-existing out of facility DNR order (yellow form or pink MOST form)  No data  "MOST" Form in Place?  No data      TOTAL TIME TAKING CARE OF THIS PATIENT: 37 minutes.    Wetzel Meester M.D on 01/04/2016 at 8:59 AM  Between 7am to 6pm - Pager - [phone]  After 6pm go to [email or web address] - Proofreader  [facility]  Office  [phone]  CC: Primary care physician; Casilda Carls, MD   Note: This dictation was prepared with Dragon  dictation along with smaller phrase technology. Any transcriptional errors that result from this process are unintentional.

## 2016-01-04 NOTE — Clinical Social Work Note (Addendum)
MSW presented bed offers to patient and his wife and they chose [facility].  MSW contacted [facility] and they have agreed to accept patient.  MSW is waiting on [facility] number before patient can go to SNF, required clinicals were faxed to [facility] today awaiting response back from [facility].  MSW to facilitate discharge planning to [facility] once [facility] number has been received.  10:30 am MSW received [facility] 2 [facility] number back, MSW updated patient and [facility]. Patient to be d/c'ed today to [facility].  Patient and family agreeable to plans will transport via ems RN to call report to Rm. [phone].  Evette Cristal, MSW Mon-Fri 8a-4:30p [phone]   Jones Broom. Fortunato Nordin, MSW [phone]  Mon-Fri 8a-4:30p 01/04/2016 9:43 AM

## 2016-01-04 NOTE — Progress Notes (Signed)
Report called to Connye Burkitt RN at [facility].

## 2016-01-04 NOTE — Progress Notes (Signed)
EMS here to transport pt to [facility] 

## 2016-01-04 NOTE — Care Management Obs Status (Signed)
[facility] NOTIFICATION   Patient Details  Name: Roger Pruitt MRN: [id number] Date of Birth: March 11, 1951   Medicare Observation Status Notification Given:  Yes    Marshell Garfinkel, RN 01/04/2016, 8:34 AM

## 2016-01-04 NOTE — Progress Notes (Signed)
EMS called for non emergent transport to [facility]. 

## 2016-01-05 DIAGNOSIS — I2510 Atherosclerotic heart disease of native coronary artery without angina pectoris: Secondary | ICD-10-CM | POA: Diagnosis not present

## 2016-01-05 DIAGNOSIS — K219 Gastro-esophageal reflux disease without esophagitis: Secondary | ICD-10-CM | POA: Diagnosis not present

## 2016-01-05 DIAGNOSIS — I10 Essential (primary) hypertension: Secondary | ICD-10-CM | POA: Diagnosis not present

## 2016-01-05 DIAGNOSIS — F319 Bipolar disorder, unspecified: Secondary | ICD-10-CM | POA: Diagnosis not present

## 2016-01-05 DIAGNOSIS — N401 Enlarged prostate with lower urinary tract symptoms: Secondary | ICD-10-CM | POA: Diagnosis not present

## 2016-01-05 DIAGNOSIS — E119 Type 2 diabetes mellitus without complications: Secondary | ICD-10-CM | POA: Diagnosis not present

## 2016-01-05 DIAGNOSIS — M6281 Muscle weakness (generalized): Secondary | ICD-10-CM | POA: Diagnosis not present

## 2016-01-05 DIAGNOSIS — R55 Syncope and collapse: Secondary | ICD-10-CM | POA: Diagnosis not present

## 2016-01-08 NOTE — Telephone Encounter (Signed)
error 

## 2016-01-12 ENCOUNTER — Ambulatory Visit: Payer: Self-pay | Admitting: Thoracic Surgery (Cardiothoracic Vascular Surgery)

## 2016-01-14 DIAGNOSIS — E782 Mixed hyperlipidemia: Secondary | ICD-10-CM | POA: Diagnosis not present

## 2016-01-14 DIAGNOSIS — I2510 Atherosclerotic heart disease of native coronary artery without angina pectoris: Secondary | ICD-10-CM | POA: Diagnosis not present

## 2016-01-14 DIAGNOSIS — R55 Syncope and collapse: Secondary | ICD-10-CM | POA: Diagnosis not present

## 2016-01-14 DIAGNOSIS — I10 Essential (primary) hypertension: Secondary | ICD-10-CM | POA: Diagnosis not present

## 2016-01-15 DIAGNOSIS — Z951 Presence of aortocoronary bypass graft: Secondary | ICD-10-CM | POA: Diagnosis not present

## 2016-01-15 DIAGNOSIS — Z48812 Encounter for surgical aftercare following surgery on the circulatory system: Secondary | ICD-10-CM | POA: Diagnosis not present

## 2016-01-16 DIAGNOSIS — Z951 Presence of aortocoronary bypass graft: Secondary | ICD-10-CM | POA: Diagnosis not present

## 2016-01-16 DIAGNOSIS — Z48812 Encounter for surgical aftercare following surgery on the circulatory system: Secondary | ICD-10-CM | POA: Diagnosis not present

## 2016-01-18 DIAGNOSIS — Z48812 Encounter for surgical aftercare following surgery on the circulatory system: Secondary | ICD-10-CM | POA: Diagnosis not present

## 2016-01-18 DIAGNOSIS — Z951 Presence of aortocoronary bypass graft: Secondary | ICD-10-CM | POA: Diagnosis not present

## 2016-01-19 DIAGNOSIS — Z951 Presence of aortocoronary bypass graft: Secondary | ICD-10-CM | POA: Diagnosis not present

## 2016-01-19 DIAGNOSIS — Z48812 Encounter for surgical aftercare following surgery on the circulatory system: Secondary | ICD-10-CM | POA: Diagnosis not present

## 2016-01-21 DIAGNOSIS — Z951 Presence of aortocoronary bypass graft: Secondary | ICD-10-CM | POA: Diagnosis not present

## 2016-01-21 DIAGNOSIS — Z48812 Encounter for surgical aftercare following surgery on the circulatory system: Secondary | ICD-10-CM | POA: Diagnosis not present

## 2016-01-22 DIAGNOSIS — Z951 Presence of aortocoronary bypass graft: Secondary | ICD-10-CM | POA: Diagnosis not present

## 2016-01-22 DIAGNOSIS — G20 Parkinson's disease: Secondary | ICD-10-CM | POA: Diagnosis not present

## 2016-01-22 DIAGNOSIS — I2510 Atherosclerotic heart disease of native coronary artery without angina pectoris: Secondary | ICD-10-CM | POA: Diagnosis not present

## 2016-01-22 DIAGNOSIS — I158 Other secondary hypertension: Secondary | ICD-10-CM | POA: Diagnosis not present

## 2016-01-22 DIAGNOSIS — Z48812 Encounter for surgical aftercare following surgery on the circulatory system: Secondary | ICD-10-CM | POA: Diagnosis not present

## 2016-01-22 DIAGNOSIS — F319 Bipolar disorder, unspecified: Secondary | ICD-10-CM | POA: Diagnosis not present

## 2016-01-26 ENCOUNTER — Ambulatory Visit: Payer: Self-pay | Admitting: Physical Therapy

## 2016-01-26 ENCOUNTER — Ambulatory Visit: Payer: Medicare HMO | Admitting: Speech Pathology

## 2016-01-26 DIAGNOSIS — Z951 Presence of aortocoronary bypass graft: Secondary | ICD-10-CM | POA: Diagnosis not present

## 2016-01-26 DIAGNOSIS — Z48812 Encounter for surgical aftercare following surgery on the circulatory system: Secondary | ICD-10-CM | POA: Diagnosis not present

## 2016-01-27 ENCOUNTER — Encounter (HOSPITAL_COMMUNITY): Payer: Self-pay | Admitting: *Deleted

## 2016-01-27 DIAGNOSIS — Z48812 Encounter for surgical aftercare following surgery on the circulatory system: Secondary | ICD-10-CM | POA: Diagnosis not present

## 2016-01-27 DIAGNOSIS — Z951 Presence of aortocoronary bypass graft: Secondary | ICD-10-CM | POA: Diagnosis not present

## 2016-01-28 DIAGNOSIS — Z48812 Encounter for surgical aftercare following surgery on the circulatory system: Secondary | ICD-10-CM | POA: Diagnosis not present

## 2016-01-28 DIAGNOSIS — Z951 Presence of aortocoronary bypass graft: Secondary | ICD-10-CM | POA: Diagnosis not present

## 2016-02-01 ENCOUNTER — Other Ambulatory Visit: Payer: Self-pay | Admitting: Thoracic Surgery (Cardiothoracic Vascular Surgery)

## 2016-02-01 ENCOUNTER — Ambulatory Visit: Payer: Medicare HMO | Admitting: Physical Therapy

## 2016-02-01 ENCOUNTER — Encounter: Payer: Self-pay | Admitting: Speech Pathology

## 2016-02-01 DIAGNOSIS — Z951 Presence of aortocoronary bypass graft: Secondary | ICD-10-CM

## 2016-02-02 ENCOUNTER — Encounter: Payer: Self-pay | Admitting: Speech Pathology

## 2016-02-02 ENCOUNTER — Ambulatory Visit: Payer: Self-pay | Admitting: Thoracic Surgery (Cardiothoracic Vascular Surgery)

## 2016-02-02 ENCOUNTER — Ambulatory Visit: Payer: Self-pay | Admitting: Physical Therapy

## 2016-02-03 ENCOUNTER — Encounter: Payer: Self-pay | Admitting: Speech Pathology

## 2016-02-03 ENCOUNTER — Ambulatory Visit: Payer: Self-pay | Admitting: Physical Therapy

## 2016-02-04 ENCOUNTER — Encounter: Payer: Self-pay | Admitting: Speech Pathology

## 2016-02-04 ENCOUNTER — Ambulatory Visit: Payer: Self-pay | Admitting: Physical Therapy

## 2016-02-06 DIAGNOSIS — Z951 Presence of aortocoronary bypass graft: Secondary | ICD-10-CM | POA: Diagnosis not present

## 2016-02-06 DIAGNOSIS — Z48812 Encounter for surgical aftercare following surgery on the circulatory system: Secondary | ICD-10-CM | POA: Diagnosis not present

## 2016-02-08 ENCOUNTER — Encounter: Payer: Self-pay | Admitting: Speech Pathology

## 2016-02-08 ENCOUNTER — Ambulatory Visit: Payer: Self-pay | Admitting: Physical Therapy

## 2016-02-09 ENCOUNTER — Ambulatory Visit: Payer: Self-pay | Admitting: Physical Therapy

## 2016-02-09 ENCOUNTER — Ambulatory Visit (INDEPENDENT_AMBULATORY_CARE_PROVIDER_SITE_OTHER): Payer: Self-pay | Admitting: Thoracic Surgery (Cardiothoracic Vascular Surgery)

## 2016-02-09 ENCOUNTER — Encounter: Payer: Self-pay | Admitting: Speech Pathology

## 2016-02-09 ENCOUNTER — Ambulatory Visit
Admission: RE | Admit: 2016-02-09 | Discharge: 2016-02-09 | Disposition: A | Discharge status: Home / Self Care | Payer: Medicare Other | Source: Ambulatory Visit | Attending: Thoracic Surgery (Cardiothoracic Vascular Surgery) | Admitting: Thoracic Surgery (Cardiothoracic Vascular Surgery)

## 2016-02-09 VITALS — BP 155/95 | HR 100 | Resp 18 | Ht 75.0 in | Wt 204.0 lb

## 2016-02-09 DIAGNOSIS — R079 Chest pain, unspecified: Secondary | ICD-10-CM | POA: Diagnosis not present

## 2016-02-09 DIAGNOSIS — Z951 Presence of aortocoronary bypass graft: Secondary | ICD-10-CM

## 2016-02-09 DIAGNOSIS — I2510 Atherosclerotic heart disease of native coronary artery without angina pectoris: Secondary | ICD-10-CM

## 2016-02-09 NOTE — Progress Notes (Signed)
[address]       [place] [address]             [phone]       HPI: Mr. Germani is for a scheduled postoperative visit.  He is a 65 year old man with a history of coronary disease, MI, hypertension, hyperlipidemia, type 2 diabetes, bipolar disorder, multiple mini strokes, and recent onset of Parkinson's disease. He presented with exertional shortness of breath and chest tightness. He was found to have three-vessel disease catheterization. He underwent coronary bypass grafting 4 on 12/25/2015. At the time of surgery he was noted to have severe diffuse coronary disease.  His postoperative course was unremarkable and he went home on day 5. The following day he had a syncopal episode and was admitted at [facility]. He was hospitalized there for about 4 days before he was discharged to rehabilitation. He did not have any further syncopal episodes while in the hospital or rehabilitation but then had another one the day after he left the rehabilitation facility. Dr. Nehemiah Massed stopped all of his blood pressure medication.  Past Medical History:  Diagnosis Date  . Abdominal pain   . Acute cystitis without hematuria   . Anemia   . Anginal pain ([place])   . Arthritis   . Bipolar disorder ([place])    BIPOLAR/ SCHIZO/ AFFECTIVE   . BPD (bronchopulmonary dysplasia)   . BPH (benign prostatic hyperplasia)   . CAD (coronary artery disease)    a. s/p MI 2001, 2012;  b. s/p prior LCX stenting; b. 05/2012 Abnl MV; c. 05/2012 Cath: patent LCX stent w/ otw mod non-obs dzs->Med Rx.  . Carpal tunnel syndrome   . Disc degeneration, lumbar   . Elevated PSA   . Esophageal reflux   . GERD (gastroesophageal reflux disease)   . Hematuria    history of gross and microscopic  . Hemorrhoid   . History of balanitis   . History of bladder infections   . History of echocardiogram    a. 07/2014 Echo: EF 60-65%, no rwma, nl LA size, nl RV/PASP.  Marland Kitchen History of urethral stricture   .  Hyperlipidemia   . Hypertension   . Hypertensive heart disease   . Incomplete bladder emptying   . Iron deficiency anemia, unspecified   . Lower urinary tract symptoms   . Myocardial infarction    X7  . Neuropathy ([place])   . Nocturia   . Obsessive compulsive disorder   . Other dysphagia   . Panic attack   . Parkinson's disease ([place])   . Sleep apnea    SURGICAL TX  . Stroke ([place])   . Type II diabetes mellitus ([place])   . Unspecified vitamin D deficiency   . Urethral stricture   . Urinary retention   . Varicose vein of leg   . Vitamin D deficiency       Current Outpatient Prescriptions  Medication Sig Dispense Refill  . acetaminophen (TYLENOL) 500 MG tablet Take 1,000 mg by mouth every 6 (six) hours as needed (pain).    Marland Kitchen aspirin EC 325 MG EC tablet Take 1 tablet (325 mg total) by mouth daily. 30 tablet 0  . atorvastatin (LIPITOR) 40 MG tablet Take 1 tablet (40 mg total) by mouth daily at 6 PM. 30 tablet 3  . buPROPion (WELLBUTRIN XL) 300 MG 24 hr tablet Take 300 mg by mouth daily. Takes at noon.    . Carbamazepine (EQUETRO) 300 MG CP12 Take  300 mg by mouth 2 (two) times daily.     . clopidogrel (PLAVIX) 75 MG tablet Take 75 mg by mouth daily.    Marland Kitchen docusate sodium (COLACE) 100 MG capsule Take 1 capsule (100 mg total) by mouth 2 (two) times daily. 10 capsule 0  . finasteride (PROSCAR) 5 MG tablet Take 5 mg by mouth daily.    . fluticasone (FLONASE) 50 MCG/ACT nasal spray Place 1 spray into both nostrils daily.    Marland Kitchen gabapentin (NEURONTIN) 300 MG capsule Take 300 mg by mouth 3 (three) times daily.     Marland Kitchen LORazepam (ATIVAN) 1 MG tablet Take 1-2 tablets (1-2 mg total) by mouth 2 (two) times daily. Pt takes one tablet in the morning and two at bedtime. 30 tablet 0  . metFORMIN (GLUCOPHAGE) 1000 MG tablet Take 1,000 mg by mouth 2 (two) times daily with a meal.    . paliperidone (INVEGA) 6 MG 24 hr tablet Take 6 mg by mouth at bedtime.     . pantoprazole (PROTONIX) 40 MG tablet Take 40 mg  by mouth daily. Takes at noon.    . topiramate (TOPAMAX) 100 MG tablet Take 100 mg by mouth 3 (three) times daily. Takes at noon.    . traMADol (ULTRAM) 50 MG tablet Take 1 tablet (50 mg total) by mouth every 6 (six) hours as needed. 30 tablet 0   No current facility-administered medications for this visit.     Physical Exam BP (!) 155/95 (BP Location: Left Arm, Cuff Size: Normal)   Pulse 100   Resp 18   Ht 6\' 3"  (1.905 m)   Wt 204 lb (92.5 kg)   SpO2 99%   BMI 25.49 kg/m  65 year old man in no acute distress Alert and oriented 3 Cardiac mildly tachycardic but regular Lungs clear with equal breath sounds bilaterally Sternum stable, incision well-healed Leg incisions healing well No peripheral edema  Diagnostic Tests: CHEST  2 VIEW  COMPARISON:  PA and lateral chest x-ray of December 31, 2015.  FINDINGS: The lungs are adequately inflated. The pleural effusions have resolved. The heart and pulmonary vascularity are normal. The mediastinum is normal in width. There is no pneumothorax or pneumomediastinum. The sternal wires are intact. The retrosternal soft tissues have normalized. There is mild multilevel degenerative disc disease of the thoracic spine.  IMPRESSION: No CHF, residual atelectasis, or pleural effusion. No acute cardiopulmonary abnormality.   Electronically Signed   By: David  Martinique M.D.   On: 02/09/2016 12:41 I personally reviewed the chest x-ray and concur with the findings noted above  Impression: 65 year old man with a multitude of medical problems who had coronary bypass grafting 4 about 7 weeks ago. His initial postoperative course was unremarkable, but he had multiple syncopal spells after discharge and ended up back in the hospital and [place]. He then went through rehabilitation and then had another syncopal episode after leaving there. All of his blood pressure medications have been stopped and now his wife is concerned about his blood  pressure being elevated.  A surgical standpoint Mr. Decola doing fine. He's not having much pain. His incisions are healing well. His exercise tolerance is improving.  Syncope remains a concern. I agree with Dr. Alveria Apley management. I think tolerating a mildly elevated blood pressure and short-term is far less dangerous to him than repeated syncopal episodes. I will defer whether some type of long-term cardiac monitoring is needed to Dr. Nehemiah Massed.  Plan: Follow-up as scheduled with Dr. Nehemiah Massed.  I will  be happy to see Mr. Drechsler back again at any time the future if I can be of any further assistance with his care.  Melrose Nakayama, MD [facility] [phone]

## 2016-02-10 ENCOUNTER — Ambulatory Visit (INDEPENDENT_AMBULATORY_CARE_PROVIDER_SITE_OTHER): Payer: Medicare Other | Admitting: Urology

## 2016-02-10 ENCOUNTER — Ambulatory Visit: Payer: Self-pay | Admitting: Physical Therapy

## 2016-02-10 ENCOUNTER — Encounter: Payer: Self-pay | Admitting: Speech Pathology

## 2016-02-10 ENCOUNTER — Encounter: Payer: Self-pay | Admitting: Urology

## 2016-02-10 ENCOUNTER — Telehealth: Payer: Self-pay | Admitting: Family Medicine

## 2016-02-10 ENCOUNTER — Ambulatory Visit
Admission: RE | Admit: 2016-02-10 | Discharge: 2016-02-10 | Disposition: A | Discharge status: Home / Self Care | Payer: Medicare Other | Source: Ambulatory Visit | Attending: Urology | Admitting: Urology

## 2016-02-10 VITALS — BP 106/70 | HR 101 | Ht 75.0 in | Wt 201.8 lb

## 2016-02-10 DIAGNOSIS — N433 Hydrocele, unspecified: Secondary | ICD-10-CM | POA: Diagnosis not present

## 2016-02-10 DIAGNOSIS — N453 Epididymo-orchitis: Secondary | ICD-10-CM

## 2016-02-10 DIAGNOSIS — N5089 Other specified disorders of the male genital organs: Secondary | ICD-10-CM | POA: Diagnosis not present

## 2016-02-10 DIAGNOSIS — R6889 Other general symptoms and signs: Secondary | ICD-10-CM | POA: Insufficient documentation

## 2016-02-10 DIAGNOSIS — N390 Urinary tract infection, site not specified: Secondary | ICD-10-CM | POA: Diagnosis not present

## 2016-02-10 DIAGNOSIS — I2510 Atherosclerotic heart disease of native coronary artery without angina pectoris: Secondary | ICD-10-CM

## 2016-02-10 LAB — URINALYSIS, COMPLETE
Bilirubin, UA: NEGATIVE
GLUCOSE, UA: NEGATIVE
NITRITE UA: NEGATIVE
SPEC GRAV UA: 1.02 (ref 1.005–1.030)
Urobilinogen, Ur: 1 mg/dL (ref 0.2–1.0)
pH, UA: 6 (ref 5.0–7.5)

## 2016-02-10 LAB — MICROSCOPIC EXAMINATION
Epithelial Cells (non renal): NONE SEEN /hpf (ref 0–10)
RBC MICROSCOPIC, UA: NONE SEEN /HPF (ref 0–?)
WBC, UA: >30 /hpf — ABNORMAL HIGH (ref 0–?)

## 2016-02-10 MED ORDER — CIPROFLOXACIN HCL 500 MG PO TABS: 500.0000 mg | ORAL_TABLET | Freq: Two times a day (BID) | ORAL | 0 refills | Status: DC

## 2016-02-10 NOTE — Telephone Encounter (Signed)
-----   Message from Nori Riis, PA-C sent at 02/10/2016  4:12 PM EST ----- Please notify the patient that the scrotal ultrasound does indicate that he has an infection.  He needs to start the Cipro and we need to see him in 2 weeks.

## 2016-02-10 NOTE — Progress Notes (Signed)
02/10/2016 4:13 PM   Roger Pruitt Sep 10, 1950 [id number]  Referring provider: Casilda Carls [address] [place], [place] [address]  Chief Complaint  Patient presents with  . Recurrent UTI    HPI: Patient is a 65 year old Caucasian male who presents today requesting an urgent appointment for urinary frequency and dysuria.  He has had these symptoms for two months.  He has stopped CIC two months ago as he felt it was not necessary.  He denies fevers, chills, nausea and vomiting.  He has had an episode of gross hematuria, but he denies suprapubic pain.   He also is complaining of scrotal swelling and pain that started two days ago.  He denies any trauma to the scrotal area.     His PVR today was 319 mL.  His UA was >30WBC's and many.  Patient was cathed for UA.   PMH: Past Medical History:  Diagnosis Date  . Abdominal pain   . Acute cystitis without hematuria   . Anemia   . Anginal pain ([place])   . Arthritis   . Bipolar disorder ([place])    BIPOLAR/ SCHIZO/ AFFECTIVE   . BPD (bronchopulmonary dysplasia)   . BPH (benign prostatic hyperplasia)   . CAD (coronary artery disease)    a. s/p MI 2001, 2012;  b. s/p prior LCX stenting; b. 05/2012 Abnl MV; c. 05/2012 Cath: patent LCX stent w/ otw mod non-obs dzs->Med Rx.  . Carpal tunnel syndrome   . Disc degeneration, lumbar   . Elevated PSA   . Esophageal reflux   . GERD (gastroesophageal reflux disease)   . Hematuria    history of gross and microscopic  . Hemorrhoid   . History of balanitis   . History of bladder infections   . History of echocardiogram    a. 07/2014 Echo: EF 60-65%, no rwma, nl LA size, nl RV/PASP.  Marland Kitchen History of urethral stricture   . Hyperlipidemia   . Hypertension   . Hypertensive heart disease   . Incomplete bladder emptying   . Iron deficiency anemia, unspecified   . Lower urinary tract symptoms   . Myocardial infarction    X7  . Neuropathy ([place])   . Nocturia   . Obsessive compulsive disorder   .  Other dysphagia   . Panic attack   . Parkinson's disease ([place])   . Sleep apnea    SURGICAL TX  . Stroke ([place])   . Type II diabetes mellitus ([place])   . Unspecified vitamin D deficiency   . Urethral stricture   . Urinary retention   . Varicose vein of leg   . Vitamin D deficiency     Surgical History: Past Surgical History:  Procedure Laterality Date  . BACK SURGERY     lumbar disc  . BACK SURGERY    . CARDIAC CATHETERIZATION  6/10;2011;Aug.2012;Oct. 2012;   . CARDIAC CATHETERIZATION  05/2012   [facility]; EF 60%, patent stent in the left circumflex, moderate three-vessel disease with no flow-limiting lesions. Unchanged from most recent catheterization  . CARDIAC CATHETERIZATION N/A 12/10/2015   Procedure: Left Heart Cath and Coronary Angiography;  Surgeon: Corey Skains, MD;  Location: [facility];  Service: Cardiovascular;  Laterality: N/A;  . CARPAL TUNNEL RELEASE  2009   left  . CORONARY ANGIOPLASTY  2011 & 2001   s/p stent  . CORONARY ARTERY BYPASS GRAFT     stent placement  . CORONARY ARTERY BYPASS GRAFT N/A 12/25/2015  Procedure: CORONARY ARTERY BYPASS GRAFTING (CABG) x4  , using left internal mammary artery and right leg greater saphenous vein harvested endoscopically LIMA-LAD SVG-OM2 SVG-DIAG SVG-PD;  Surgeon: Melrose Nakayama, MD;  Location: [facility];  Service: Open Heart Surgery;  Laterality: N/A;  . [place]  . CYST REMOVAL LEG Right   . SKIN GRAFT    . TEE WITHOUT CARDIOVERSION N/A 12/25/2015   Procedure: TRANSESOPHAGEAL ECHOCARDIOGRAM (TEE);  Surgeon: Melrose Nakayama, MD;  Location: [facility];  Service: Open Heart Surgery;  Laterality: N/A;  . TONSILLECTOMY      Home Medications:    Medication List       Accurate as of 02/10/16  4:13 PM. Always use your most recent med list.          acetaminophen 500 MG tablet Commonly known as:  TYLENOL Take 1,000 mg by mouth every 6 (six) hours as needed (pain).   aspirin 325 MG EC tablet Take 1  tablet (325 mg total) by mouth daily.   atorvastatin 40 MG tablet Commonly known as:  LIPITOR Take 1 tablet (40 mg total) by mouth daily at 6 PM.   buPROPion 300 MG 24 hr tablet Commonly known as:  WELLBUTRIN XL Take 300 mg by mouth daily. Takes at noon.   Carbamazepine 300 MG Cp12 Commonly known as:  Equetro Take 300 mg by mouth 2 (two) times daily.   ciprofloxacin 500 MG tablet Commonly known as:  CIPRO Take 1 tablet (500 mg total) by mouth every 12 (twelve) hours.   clopidogrel 75 MG tablet Commonly known as:  PLAVIX Take 75 mg by mouth daily.   docusate sodium 100 MG capsule Commonly known as:  COLACE Take 1 capsule (100 mg total) by mouth 2 (two) times daily.   finasteride 5 MG tablet Commonly known as:  PROSCAR Take 5 mg by mouth daily.   fluticasone 50 MCG/ACT nasal spray Commonly known as:  FLONASE Place 1 spray into both nostrils daily.   gabapentin 300 MG capsule Commonly known as:  NEURONTIN Take 300 mg by mouth 3 (three) times daily.   LORazepam 1 MG tablet Commonly known as:  ATIVAN Take 1-2 tablets (1-2 mg total) by mouth 2 (two) times daily. Pt takes one tablet in the morning and two at bedtime.   metFORMIN 1000 MG tablet Commonly known as:  GLUCOPHAGE Take 1,000 mg by mouth 2 (two) times daily with a meal.   paliperidone 6 MG 24 hr tablet Commonly known as:  INVEGA Take 6 mg by mouth at bedtime.   pantoprazole 40 MG tablet Commonly known as:  PROTONIX Take 40 mg by mouth daily. Takes at noon.   topiramate 100 MG tablet Commonly known as:  TOPAMAX Take 100 mg by mouth 3 (three) times daily. Takes at noon.   traMADol 50 MG tablet Commonly known as:  ULTRAM Take 1 tablet (50 mg total) by mouth every 6 (six) hours as needed.       Allergies:  Allergies  Allergen Reactions  . Morphine And Related Nausea And Vomiting  . Synalgos-Dc [Aspirin-Caff-Dihydrocodeine] Itching and Other (See Comments)    Reaction:  Stinging   . Synchlor A  [Chlorpheniramine] Rash    Patient doesn't recall this allergy.    Family History: Family History  Problem Relation Age of Onset  . Heart disease Mother   . Heart disease Brother   . Prostate cancer Father   . Heart disease    . Heart disease Brother   .  Kidney disease Neg Hx     Social History:  reports that he quit smoking about 16 years ago. His smoking use included Cigarettes. He has a 7.50 pack-year smoking history. He has never used smokeless tobacco. He reports that he does not drink alcohol or use drugs.  ROS: UROLOGY Frequent Urination?: Yes Hard to postpone urination?: Yes Burning/pain with urination?: Yes Get up at night to urinate?: No Leakage of urine?: No Urine stream starts and stops?: Yes Trouble starting stream?: No Do you have to strain to urinate?: No Blood in urine?: Yes Urinary tract infection?: Yes Sexually transmitted disease?: No Injury to kidneys or bladder?: No Painful intercourse?: No Weak stream?: No Erection problems?: No Penile pain?: No  Gastrointestinal Nausea?: No Vomiting?: No Indigestion/heartburn?: No Diarrhea?: No Constipation?: No  Constitutional Fever: No Night sweats?: No Weight loss?: No Fatigue?: No  Skin Skin rash/lesions?: No Itching?: No  Eyes Blurred vision?: No Double vision?: No  Ears/Nose/Throat Sore throat?: No Sinus problems?: No  Hematologic/Lymphatic Swollen glands?: No Easy bruising?: No  Cardiovascular Leg swelling?: No Chest pain?: No  Respiratory Cough?: No Shortness of breath?: No  Endocrine Excessive thirst?: No  Musculoskeletal Back pain?: No Joint pain?: No  Neurological Headaches?: No Dizziness?: No  Psychologic Depression?: No Anxiety?: No  Physical Exam: BP 106/70   Pulse (!) 101   Ht 6\' 3"  (1.905 m)   Wt 201 lb 12.8 oz (91.5 kg)   BMI 25.22 kg/m   Constitutional: Well nourished. Alert and oriented, No acute distress. HEENT: [place] AT, moist mucus membranes.  Trachea midline, no masses. Cardiovascular: No clubbing, cyanosis, or edema. Respiratory: Normal respiratory effort, no increased work of breathing. GI: Abdomen is soft, non tender, non distended, no abdominal masses. Liver and spleen not palpable.  No hernias appreciated.  Stool sample for occult testing is not indicated.   GU: No CVA tenderness.  No bladder fullness or masses.  Patient with uncircumcised phallus. Foreskin easily retracted  Urethral meatus is patent.  No penile discharge. No penile lesions or rashes. Scrotum without lesions, cysts, rashes and/or edema.  Testicles are located scrotally bilaterally. Left testicle is tender, swollen.  Left epididymis is tender and indurated.  Reactive hydrocele is present.  No masses are appreciated in the testicles. Right epididymis are normal. Rectal: Deferred.   Skin: No rashes, bruises or suspicious lesions. Lymph: No cervical or inguinal adenopathy. Neurologic: Grossly intact, no focal deficits, moving all 4 extremities. Psychiatric: Normal mood and affect.  Laboratory Data: Lab Results  Component Value Date   WBC 7.0 01/01/2016   HGB 8.8 (L) 01/01/2016   HCT 25.3 (L) 01/01/2016   MCV 91.7 01/01/2016   PLT 234 01/01/2016    Lab Results  Component Value Date   CREATININE 0.92 01/03/2016    Lab Results  Component Value Date   HGBA1C 5.1 12/22/2015    Lab Results  Component Value Date   TSH 0.682 07/28/2015       Component Value Date/Time   CHOL 187 08/24/2014 0604   CHOL 124 01/31/2014 0448   HDL 39 (L) 08/24/2014 0604   HDL 25 (L) 01/31/2014 0448   CHOLHDL 4.8 08/24/2014 0604   VLDL 30 08/24/2014 0604   VLDL 23 01/31/2014 0448   LDLCALC 118 (H) 08/24/2014 0604   LDLCALC 76 01/31/2014 0448    Lab Results  Component Value Date   AST 20 12/31/2015   Lab Results  Component Value Date   ALT 21 12/31/2015    Urinalysis >  30 WBC's/hpf and many bacteria.  See EPIC.   Pertinent Imaging: CLINICAL DATA: Orchitis  epididymitis. Pain and swelling on the left, 2 days duration. Some symptoms on the right.  EXAM: SCROTAL ULTRASOUND  DOPPLER ULTRASOUND OF THE TESTICLES  TECHNIQUE: Complete ultrasound examination of the testicles, epididymis, and other scrotal structures was performed. Color and spectral Doppler ultrasound were also utilized to evaluate blood flow to the testicles.  COMPARISON: None.  FINDINGS: Right testicle  Measurements: 4.8 x 2.2 x 2.8 cm. No mass or microlithiasis visualized. Normal color flow pattern.  Left testicle  Measurements: 4.8 x 3.1 x 3.1 cm. No mass or microlithiasis visualized. Mild hyperemia.  Right epididymis: Normal  Left epididymis: Mild hyperemia.  Hydrocele: Small right and moderate left hydrocele.  Varicocele: None  Pulsed Doppler interrogation of both testes demonstrates normal low resistance arterial and venous waveforms bilaterally.  IMPRESSION: Findings consistent with left epididymo-orchitis. Mild hyperemia and swelling. Left hydrocele which could be reactive or pre date the inflammation.   Electronically Signed By: Nelson Chimes M.D. On: 02/10/2016 15:24  Assessment & Plan:    1. Orchitis/epididymis  - obtain scrotal ultrasound today-Findings consistent with left epididymo-orchitis. Mild hyperemia and swelling. Left hydrocele which could be reactive or pre date the inflammation.  - start Cipro 500 mg, bid for two weeks  - RTC in 2 weeks for exam and symptom recheck    2. Recurrent UTI  - Urinalysis, Complete  - Bladder Scan (Post Void Residual) in office   Return in about 2 weeks (around 02/24/2016) for exam and PVR.  These notes generated with voice recognition software. I apologize for typographical errors.  Zara Council, [facility] [address], [place], [place] [address] [phone]

## 2016-02-10 NOTE — Telephone Encounter (Signed)
Patient notified and will start ABX 

## 2016-02-11 ENCOUNTER — Encounter: Payer: Self-pay | Admitting: Speech Pathology

## 2016-02-11 ENCOUNTER — Ambulatory Visit: Payer: Self-pay | Admitting: Physical Therapy

## 2016-02-11 ENCOUNTER — Telehealth: Payer: Self-pay | Admitting: Urology

## 2016-02-11 NOTE — Telephone Encounter (Signed)
Appt made next month at [facility] location. Pt aware of location.

## 2016-02-11 NOTE — Telephone Encounter (Signed)
Please call patient and schedule a 1 month follow up with [facility] for testicle swelling

## 2016-02-12 ENCOUNTER — Telehealth: Payer: Self-pay | Admitting: Urology

## 2016-02-12 DIAGNOSIS — Z48812 Encounter for surgical aftercare following surgery on the circulatory system: Secondary | ICD-10-CM | POA: Diagnosis not present

## 2016-02-12 DIAGNOSIS — Z951 Presence of aortocoronary bypass graft: Secondary | ICD-10-CM | POA: Diagnosis not present

## 2016-02-12 NOTE — Telephone Encounter (Signed)
Roger Pruitt called and spoke with him on 11/15 about his [place] results.  Has he picked up his antibiotic? Is his follow up appointment made?

## 2016-02-12 NOTE — Telephone Encounter (Signed)
Pt called and wanted to know U/S results from 11/15.

## 2016-02-13 LAB — CULTURE, URINE COMPREHENSIVE

## 2016-02-15 ENCOUNTER — Telehealth: Payer: Self-pay

## 2016-02-15 ENCOUNTER — Encounter: Payer: Self-pay | Admitting: Speech Pathology

## 2016-02-15 ENCOUNTER — Ambulatory Visit: Payer: Self-pay | Admitting: Physical Therapy

## 2016-02-15 NOTE — Telephone Encounter (Signed)
Spoke with pt in reference to +ucx. Made aware to keep taking cipro. Pt voiced understanding.

## 2016-02-15 NOTE — Telephone Encounter (Signed)
-----   Message from Nori Riis, PA-C sent at 02/14/2016  7:59 PM EST ----- Patient's urine culture is positive for infection. He is to continue the Cipro.

## 2016-02-16 ENCOUNTER — Ambulatory Visit: Payer: Self-pay | Admitting: Physical Therapy

## 2016-02-16 ENCOUNTER — Encounter: Payer: Self-pay | Admitting: Speech Pathology

## 2016-02-17 ENCOUNTER — Ambulatory Visit: Payer: Self-pay | Admitting: Physical Therapy

## 2016-02-17 ENCOUNTER — Encounter: Payer: Self-pay | Admitting: Speech Pathology

## 2016-02-17 DIAGNOSIS — Z951 Presence of aortocoronary bypass graft: Secondary | ICD-10-CM | POA: Diagnosis not present

## 2016-02-17 DIAGNOSIS — Z48812 Encounter for surgical aftercare following surgery on the circulatory system: Secondary | ICD-10-CM | POA: Diagnosis not present

## 2016-02-22 ENCOUNTER — Ambulatory Visit: Payer: Self-pay | Admitting: Physical Therapy

## 2016-02-22 ENCOUNTER — Encounter: Payer: Self-pay | Admitting: Speech Pathology

## 2016-02-23 ENCOUNTER — Ambulatory Visit: Payer: Self-pay | Admitting: Physical Therapy

## 2016-02-23 ENCOUNTER — Encounter: Payer: Self-pay | Admitting: Speech Pathology

## 2016-02-24 ENCOUNTER — Ambulatory Visit: Payer: Self-pay | Admitting: Physical Therapy

## 2016-02-24 ENCOUNTER — Encounter: Payer: Self-pay | Admitting: Speech Pathology

## 2016-02-25 ENCOUNTER — Encounter: Payer: Self-pay | Admitting: Speech Pathology

## 2016-02-25 ENCOUNTER — Ambulatory Visit: Payer: Self-pay | Admitting: Physical Therapy

## 2016-02-25 DIAGNOSIS — E782 Mixed hyperlipidemia: Secondary | ICD-10-CM | POA: Diagnosis not present

## 2016-02-25 DIAGNOSIS — I10 Essential (primary) hypertension: Secondary | ICD-10-CM | POA: Diagnosis not present

## 2016-02-25 DIAGNOSIS — I2510 Atherosclerotic heart disease of native coronary artery without angina pectoris: Secondary | ICD-10-CM | POA: Diagnosis not present

## 2016-02-26 ENCOUNTER — Ambulatory Visit
Admission: RE | Admit: 2016-02-26 | Discharge: 2016-02-26 | Disposition: A | Discharge status: Home / Self Care | Payer: Medicare Other | Source: Ambulatory Visit | Attending: Internal Medicine | Admitting: Internal Medicine

## 2016-02-26 DIAGNOSIS — Z951 Presence of aortocoronary bypass graft: Secondary | ICD-10-CM | POA: Insufficient documentation

## 2016-02-26 DIAGNOSIS — R918 Other nonspecific abnormal finding of lung field: Secondary | ICD-10-CM | POA: Diagnosis not present

## 2016-02-27 DIAGNOSIS — Z951 Presence of aortocoronary bypass graft: Secondary | ICD-10-CM | POA: Diagnosis not present

## 2016-02-27 DIAGNOSIS — Z48812 Encounter for surgical aftercare following surgery on the circulatory system: Secondary | ICD-10-CM | POA: Diagnosis not present

## 2016-02-29 ENCOUNTER — Ambulatory Visit: Payer: Self-pay | Admitting: Physical Therapy

## 2016-02-29 ENCOUNTER — Encounter: Payer: Self-pay | Admitting: Speech Pathology

## 2016-02-29 DIAGNOSIS — F250 Schizoaffective disorder, bipolar type: Secondary | ICD-10-CM | POA: Diagnosis not present

## 2016-03-01 ENCOUNTER — Ambulatory Visit (INDEPENDENT_AMBULATORY_CARE_PROVIDER_SITE_OTHER): Payer: Medicare Other | Admitting: Internal Medicine

## 2016-03-01 ENCOUNTER — Encounter: Payer: Self-pay | Admitting: Speech Pathology

## 2016-03-01 ENCOUNTER — Encounter: Payer: Self-pay | Admitting: Internal Medicine

## 2016-03-01 ENCOUNTER — Ambulatory Visit: Payer: Self-pay | Admitting: Physical Therapy

## 2016-03-01 VITALS — BP 128/80 | HR 84 | Ht 75.0 in | Wt 199.0 lb

## 2016-03-01 DIAGNOSIS — R918 Other nonspecific abnormal finding of lung field: Secondary | ICD-10-CM

## 2016-03-01 DIAGNOSIS — J302 Other seasonal allergic rhinitis: Secondary | ICD-10-CM | POA: Diagnosis not present

## 2016-03-01 DIAGNOSIS — I2510 Atherosclerotic heart disease of native coronary artery without angina pectoris: Secondary | ICD-10-CM

## 2016-03-01 DIAGNOSIS — R0609 Other forms of dyspnea: Secondary | ICD-10-CM

## 2016-03-01 NOTE — Progress Notes (Signed)
[facility] Consultation      MRN# [id number] [place] Roger Pruitt Feb 18, 1951   CC: Chief Complaint  Patient presents with  . Follow-up    CT results: bypass end of Sept. SOB w/activity; NP cough:       Brief History: 65 yo male former smoker seen for Multiple pulmonary nodules.    Events since last clinic visit: Presents today for follow-up visit of his pulmonary nodules, multiple and diffuse along with chronic cough. She stated since his last visit he's had quadruple coronary artery bypass surgery. He has had some weight loss -She does endorse a chronic cough, however today's little bit worse than it has been over the past 2 weeks. States that this time year his allergies usually act up when he has postnasal drip symptoms. No fever, no chills, no sweats. Cough is dry with no significant nasal drainage. Had a CT chest for pulmonary nodules done on 12/1 and would like to review the results today.    Review of Systems  Constitutional: Negative for chills and fever.  Eyes: Negative for blurred vision.  Respiratory: Positive for cough and shortness of breath. Negative for hemoptysis, sputum production and wheezing.   Cardiovascular: Positive for chest pain.  Neurological: Negative for dizziness and headaches.  Endo/Heme/Allergies: Does not bruise/bleed easily.  Psychiatric/Behavioral: Negative for depression.      Allergies:  Morphine and related; Synalgos-dc [aspirin-caff-dihydrocodeine]; and Synchlor a [chlorpheniramine]  Physical Examination:  VS: BP 128/80 (BP Location: Left Arm, Cuff Size: Normal)   Pulse 84   Ht 6\' 3"  (1.905 m)   Wt 199 lb (90.3 kg)   SpO2 100%   BMI 24.87 kg/m   General Appearance: No distress  HEENT: PERRLA, no ptosis, no other lesions noticed Pulmonary:normal breath sounds., diaphragmatic excursion normal.No wheezing, No rales   Cardiovascular:  Normal S1,S2.  No m/r/g.     Abdomen:Exam: Benign, Soft, non-tender, No masses    Skin:   warm, no rashes, no ecchymosis  Extremities: normal, no cyanosis, clubbing, warm with normal capillary refill.      Rad results:(The following images and results were reviewed by Dr. Stevenson Clinch on 03/01/2016). CT Chest 02/26/16 FINDINGS: Cardiovascular: Postoperative changes from CABG procedure noted. The sternotomy defect remains distinct. No pericardial effusion.  Mediastinum/Nodes: The trachea appears patent and is midline. Normal appearance of the esophagus. No mediastinal or hilar adenopathy.  Lungs/Pleura: Small left pleural effusion noted. Mild changes of paraseptal and centrilobular emphysema. 4 mm right lower lobe lung nodule is unchanged from previous exam, image 78 series 3. 4 mm lingular nodule is identified, image number 99 of series 3. Unchanged from previous exam. 2 mm left lung base nodule is unchanged, image 128 of series 3. Small granuloma identified within the left apex. Left lower lobe lung nodule is stable measuring 6 mm, image 76 of series 3.  Upper Abdomen: Calcified granulomas within the liver noted. No acute abnormality identified  Musculoskeletal: The sternotomy defect remains distinct. There is spondylosis noted within the thoracic spine  IMPRESSION: 1. No acute cardiopulmonary abnormalities identified. 2. Stable small solid appearing pulmonary nodules. The largest measures 6 mm. Future CT at 18-24 months (from 10/23/2015) is recommended for high-risk patients. This recommendation follows the consensus statement: Guidelines for Management of Incidental Pulmonary Nodules Detected on CT Images: From the Fleischner Society 2017; Radiology 2017; 284:228-243. 3. Status post median sternotomy and CABG procedure. The sternotomy defect remains distinct.   Assessment and Plan: 65 year old male with multiple bilateral benign subcentimeter pulmonary  nodules seen for follow-up visit Pulmonary nodules Repeat CT chest stable pulmonary nodules, the left  lower lobe dominant nodule is now 6 mm. I believe at this point in the majority is nodules are benign reactive nodules, he has had adequate follow-up, we will consider a repeat CT in 1 year if needed.  Plan: -Resolution of some pulmonary nodules, dominant left lower lobe nodule 6 mm and stable in size -Follow-up in 6 months -We'll consider a one-year CT follow-up after next visit.  Dyspnea Stable dyspnea on exertion. I suspect deconditioning is may be adding to the level of dyspnea is having. Primary function tests and 6 minute walk test within acceptable limits. Pulmonary function test showed FEV1 of 89% FEV1/FVC 79%, flow loops with mild flattening at end expiratory showing may be some early signs of obstruction. He did not have significant response to bronchodilators. At this time no significant obstructive or restrictive disease noted on his primary function tests. He does have significant coronary artery disease and recently had a quadruple bypass surgery. No need for inhalers at this time.  Plan: -Exercise as tolerated, 30 minutes of walking at a moderate pace at least 3 times per week as tolerated  Allergic rhinitis, seasonal Symptoms today consistent mostly with allergic rhinitis and postnasal drip. He does have Flonase at home that he uses once per day to each nostril  Plan: -One spray to each nostril in the morning and at night for the next 3-4 days then stop, if symptoms continue after 2 days and restart another cycle of Flonase twice a day   Updated Medication List Outpatient Encounter Prescriptions as of 03/01/2016  Medication Sig  . acetaminophen (TYLENOL) 500 MG tablet Take 1,000 mg by mouth every 6 (six) hours as needed (pain).  Marland Kitchen atorvastatin (LIPITOR) 40 MG tablet Take 1 tablet (40 mg total) by mouth daily at 6 PM.  . buPROPion (WELLBUTRIN XL) 300 MG 24 hr tablet Take 300 mg by mouth daily. Takes at noon.  . Carbamazepine (EQUETRO) 300 MG CP12 Take 300 mg by mouth 2  (two) times daily.   . clopidogrel (PLAVIX) 75 MG tablet Take 75 mg by mouth daily.  . finasteride (PROSCAR) 5 MG tablet Take 5 mg by mouth daily.  . fluticasone (FLONASE) 50 MCG/ACT nasal spray Place 1 spray into both nostrils daily.  Marland Kitchen gabapentin (NEURONTIN) 300 MG capsule Take 300 mg by mouth 3 (three) times daily.   Marland Kitchen LORazepam (ATIVAN) 1 MG tablet Take 1-2 tablets (1-2 mg total) by mouth 2 (two) times daily. Pt takes one tablet in the morning and two at bedtime.  . paliperidone (INVEGA) 6 MG 24 hr tablet Take 6 mg by mouth at bedtime.   . pantoprazole (PROTONIX) 40 MG tablet Take 40 mg by mouth daily. Takes at noon.  . topiramate (TOPAMAX) 100 MG tablet Take 100 mg by mouth 3 (three) times daily. Takes at noon.  . traMADol (ULTRAM) 50 MG tablet Take 1 tablet (50 mg total) by mouth every 6 (six) hours as needed.  . [DISCONTINUED] aspirin EC 325 MG EC tablet Take 1 tablet (325 mg total) by mouth daily.  . [DISCONTINUED] ciprofloxacin (CIPRO) 500 MG tablet Take 1 tablet (500 mg total) by mouth every 12 (twelve) hours.  . [DISCONTINUED] docusate sodium (COLACE) 100 MG capsule Take 1 capsule (100 mg total) by mouth 2 (two) times daily.  . [DISCONTINUED] metFORMIN (GLUCOPHAGE) 1000 MG tablet Take 1,000 mg by mouth 2 (two) times daily with a meal.  No facility-administered encounter medications on file as of 03/01/2016.     Orders for this visit: No orders of the defined types were placed in this encounter.   Thank  you for the visitation and for allowing  [facility] to assist in the care of your patient. Our recommendations are noted above.  Please contact us if we can be of further service.  Vilinda Boehringer, MD [facility] Office Number: [phone]  Note: This note was prepared with Dragon dictation along with smaller phrase technology. Any transcriptional errors that result from this process are unintentional.

## 2016-03-01 NOTE — Patient Instructions (Addendum)
Follow up with Dr. Mortimer Fries in 6 months - cont with cardiac meds - cont with Diabetes follow up with PMD - we will consider a repeat CT in 1 year for your lung nodules.  - Flonase: One spray to each nostril in the morning and at night for the next 3-4 days then stop, if symptoms continue after 2 days and restart another cycle of Flonase twice a day

## 2016-03-01 NOTE — Assessment & Plan Note (Signed)
Stable dyspnea on exertion. I suspect deconditioning is may be adding to the level of dyspnea is having. Primary function tests and 6 minute walk test within acceptable limits. Pulmonary function test showed FEV1 of 89% FEV1/FVC 79%, flow loops with mild flattening at end expiratory showing may be some early signs of obstruction. He did not have significant response to bronchodilators. At this time no significant obstructive or restrictive disease noted on his primary function tests. He does have significant coronary artery disease and recently had a quadruple bypass surgery. No need for inhalers at this time.  Plan: -Exercise as tolerated, 30 minutes of walking at a moderate pace at least 3 times per week as tolerated

## 2016-03-01 NOTE — Assessment & Plan Note (Signed)
Symptoms today consistent mostly with allergic rhinitis and postnasal drip. He does have Flonase at home that he uses once per day to each nostril  Plan: -One spray to each nostril in the morning and at night for the next 3-4 days then stop, if symptoms continue after 2 days and restart another cycle of Flonase twice a day

## 2016-03-01 NOTE — Assessment & Plan Note (Signed)
Repeat CT chest stable pulmonary nodules, the left lower lobe dominant nodule is now 6 mm. I believe at this point in the majority is nodules are benign reactive nodules, he has had adequate follow-up, we will consider a repeat CT in 1 year if needed.  Plan: -Resolution of some pulmonary nodules, dominant left lower lobe nodule 6 mm and stable in size -Follow-up in 6 months -We'll consider a one-year CT follow-up after next visit.

## 2016-03-08 DIAGNOSIS — F250 Schizoaffective disorder, bipolar type: Secondary | ICD-10-CM | POA: Diagnosis not present

## 2016-03-09 DIAGNOSIS — R634 Abnormal weight loss: Secondary | ICD-10-CM | POA: Diagnosis not present

## 2016-03-09 DIAGNOSIS — N5082 Scrotal pain: Secondary | ICD-10-CM | POA: Diagnosis not present

## 2016-03-09 DIAGNOSIS — E781 Pure hyperglyceridemia: Secondary | ICD-10-CM | POA: Diagnosis not present

## 2016-03-09 DIAGNOSIS — N509 Disorder of male genital organs, unspecified: Secondary | ICD-10-CM | POA: Diagnosis not present

## 2016-03-09 DIAGNOSIS — I10 Essential (primary) hypertension: Secondary | ICD-10-CM | POA: Diagnosis not present

## 2016-03-09 DIAGNOSIS — N50819 Testicular pain, unspecified: Secondary | ICD-10-CM | POA: Diagnosis not present

## 2016-03-09 DIAGNOSIS — E785 Hyperlipidemia, unspecified: Secondary | ICD-10-CM | POA: Diagnosis not present

## 2016-03-09 DIAGNOSIS — N390 Urinary tract infection, site not specified: Secondary | ICD-10-CM | POA: Diagnosis not present

## 2016-03-10 ENCOUNTER — Ambulatory Visit: Payer: Medicare Other | Admitting: Urology

## 2016-03-11 ENCOUNTER — Ambulatory Visit: Payer: Medicare Other | Admitting: Urology

## 2016-03-15 ENCOUNTER — Other Ambulatory Visit: Payer: Self-pay | Admitting: Physician Assistant

## 2016-03-18 ENCOUNTER — Encounter: Payer: Self-pay | Admitting: Urology

## 2016-03-18 ENCOUNTER — Ambulatory Visit (INDEPENDENT_AMBULATORY_CARE_PROVIDER_SITE_OTHER): Payer: Medicare Other | Admitting: Urology

## 2016-03-18 VITALS — BP 153/96 | HR 82 | Ht 75.0 in | Wt 200.3 lb

## 2016-03-18 DIAGNOSIS — I2510 Atherosclerotic heart disease of native coronary artery without angina pectoris: Secondary | ICD-10-CM

## 2016-03-18 DIAGNOSIS — N430 Encysted hydrocele: Secondary | ICD-10-CM | POA: Diagnosis not present

## 2016-03-18 DIAGNOSIS — N5089 Other specified disorders of the male genital organs: Secondary | ICD-10-CM | POA: Diagnosis not present

## 2016-03-18 LAB — URINALYSIS, COMPLETE
Bilirubin, UA: NEGATIVE
Glucose, UA: NEGATIVE
Ketones, UA: NEGATIVE
Leukocytes, UA: NEGATIVE
NITRITE UA: NEGATIVE
PH UA: 7 (ref 5.0–7.5)
Protein, UA: NEGATIVE
Specific Gravity, UA: 1.015 (ref 1.005–1.030)
UUROB: 0.2 mg/dL (ref 0.2–1.0)

## 2016-03-18 LAB — MICROSCOPIC EXAMINATION: BACTERIA UA: NONE SEEN

## 2016-03-18 NOTE — Progress Notes (Signed)
03/18/2016 3:22 PM   Roger Pruitt 05/14/1950 [id number]  Referring provider: Casilda Carls [address] [place], [place] [address]  Chief Complaint  Patient presents with  . Testicle Pain    swelling    HPI: The patient is a 65 year old gentleman with a past medical history of left epididymal orchitis presents today with left testicular swelling. He was previously treated with Cipro for 2 weeks for a Klebsiella epididymoorchitis in November 2017. This was confirmed also by ultrasound which showed a reactive hydrocele along with his left epididymal orchitis. He was apparently started on antibiotic that he is not sure the name of a few days ago for a urine culture which I do not have the results 4 as well. His urinalysis today is clean. He complains now of persistent swelling around his left testicle. It is mildly tender to palpation. His concern is this has not gone away since his infection approximately 1 month ago. He has no fevers or chills. He has no dysuria or symptoms of a UTI at this time.  PMH: Past Medical History:  Diagnosis Date  . Abdominal pain   . Acute cystitis without hematuria   . Anemia   . Anginal pain ([place])   . Arthritis   . Bipolar disorder ([place])    BIPOLAR/ SCHIZO/ AFFECTIVE   . BPD (bronchopulmonary dysplasia)   . BPH (benign prostatic hyperplasia)   . CAD (coronary artery disease)    a. s/p MI 2001, 2012;  b. s/p prior LCX stenting; b. 05/2012 Abnl MV; c. 05/2012 Cath: patent LCX stent w/ otw mod non-obs dzs->Med Rx.  . Carpal tunnel syndrome   . Disc degeneration, lumbar   . Elevated PSA   . Esophageal reflux   . GERD (gastroesophageal reflux disease)   . Hematuria    history of gross and microscopic  . Hemorrhoid   . History of balanitis   . History of bladder infections   . History of echocardiogram    a. 07/2014 Echo: EF 60-65%, no rwma, nl LA size, nl RV/PASP.  Marland Kitchen History of urethral stricture   . Hyperlipidemia   . Hypertension   .  Hypertensive heart disease   . Incomplete bladder emptying   . Iron deficiency anemia, unspecified   . Lower urinary tract symptoms   . Myocardial infarction    X7  . Neuropathy ([place])   . Nocturia   . Obsessive compulsive disorder   . Other dysphagia   . Panic attack   . Parkinson's disease ([place])   . Sleep apnea    SURGICAL TX  . Stroke ([place])   . Type II diabetes mellitus ([place])   . Unspecified vitamin D deficiency   . Urethral stricture   . Urinary retention   . Varicose vein of leg   . Vitamin D deficiency     Surgical History: Past Surgical History:  Procedure Laterality Date  . BACK SURGERY     lumbar disc  . BACK SURGERY    . CARDIAC CATHETERIZATION  6/10;2011;Aug.2012;Oct. 2012;   . CARDIAC CATHETERIZATION  05/2012   [facility]; EF 60%, patent stent in the left circumflex, moderate three-vessel disease with no flow-limiting lesions. Unchanged from most recent catheterization  . CARDIAC CATHETERIZATION N/A 12/10/2015   Procedure: Left Heart Cath and Coronary Angiography;  Surgeon: Corey Skains, MD;  Location: [facility];  Service: Cardiovascular;  Laterality: N/A;  . CARPAL TUNNEL RELEASE  2009   left  . CORONARY ANGIOPLASTY  2011 &  2001   s/p stent  . CORONARY ARTERY BYPASS GRAFT     stent placement  . CORONARY ARTERY BYPASS GRAFT N/A 12/25/2015   Procedure: CORONARY ARTERY BYPASS GRAFTING (CABG) x4  , using left internal mammary artery and right leg greater saphenous vein harvested endoscopically LIMA-LAD SVG-OM2 SVG-DIAG SVG-PD;  Surgeon: Melrose Nakayama, MD;  Location: [facility];  Service: Open Heart Surgery;  Laterality: N/A;  . [place]  . CYST REMOVAL LEG Right   . SKIN GRAFT    . TEE WITHOUT CARDIOVERSION N/A 12/25/2015   Procedure: TRANSESOPHAGEAL ECHOCARDIOGRAM (TEE);  Surgeon: Melrose Nakayama, MD;  Location: [facility];  Service: Open Heart Surgery;  Laterality: N/A;  . TONSILLECTOMY      Home Medications:  Allergies as of  03/18/2016      Reactions   Morphine And Related Nausea And Vomiting   Synalgos-dc [aspirin-caff-dihydrocodeine] Itching, Other (See Comments)   Reaction:  Stinging    Synchlor A [chlorpheniramine] Rash   Patient doesn't recall this allergy.      Medication List       Accurate as of 03/18/16  3:22 PM. Always use your most recent med list.          acetaminophen 500 MG tablet Commonly known as:  TYLENOL Take 1,000 mg by mouth every 6 (six) hours as needed (pain).   atorvastatin 40 MG tablet Commonly known as:  LIPITOR Take 1 tablet (40 mg total) by mouth daily at 6 PM.   buPROPion 300 MG 24 hr tablet Commonly known as:  WELLBUTRIN XL Take 300 mg by mouth daily. Takes at noon.   Carbamazepine 300 MG Cp12 Commonly known as:  Equetro Take 300 mg by mouth 2 (two) times daily.   clopidogrel 75 MG tablet Commonly known as:  PLAVIX Take 75 mg by mouth daily.   finasteride 5 MG tablet Commonly known as:  PROSCAR Take 5 mg by mouth daily.   fluticasone 50 MCG/ACT nasal spray Commonly known as:  FLONASE Place 1 spray into both nostrils daily.   LORazepam 1 MG tablet Commonly known as:  ATIVAN Take 1-2 tablets (1-2 mg total) by mouth 2 (two) times daily. Pt takes one tablet in the morning and two at bedtime.   paliperidone 6 MG 24 hr tablet Commonly known as:  INVEGA Take 6 mg by mouth at bedtime.   pantoprazole 40 MG tablet Commonly known as:  PROTONIX Take 40 mg by mouth daily. Takes at noon.   tamsulosin 0.4 MG Caps capsule Commonly known as:  FLOMAX Take 0.4 mg by mouth.   topiramate 100 MG tablet Commonly known as:  TOPAMAX Take 100 mg by mouth 3 (three) times daily. Takes at noon.       Allergies:  Allergies  Allergen Reactions  . Morphine And Related Nausea And Vomiting  . Synalgos-Dc [Aspirin-Caff-Dihydrocodeine] Itching and Other (See Comments)    Reaction:  Stinging   . Synchlor A [Chlorpheniramine] Rash    Patient doesn't recall this allergy.      Family History: Family History  Problem Relation Age of Onset  . Heart disease Mother   . Heart disease Brother   . Prostate cancer Father   . Heart disease    . Heart disease Brother   . Kidney disease Neg Hx     Social History:  reports that he quit smoking about 16 years ago. His smoking use included Cigarettes. He has a 7.50 pack-year smoking history. He has never  used smokeless tobacco. He reports that he does not drink alcohol or use drugs.  ROS: UROLOGY Frequent Urination?: Yes Hard to postpone urination?: No Burning/pain with urination?: No Get up at night to urinate?: Yes Leakage of urine?: No Urine stream starts and stops?: No Trouble starting stream?: No Do you have to strain to urinate?: No Blood in urine?: No Urinary tract infection?: Yes Sexually transmitted disease?: No Injury to kidneys or bladder?: No Painful intercourse?: No Weak stream?: Yes Erection problems?: No Penile pain?: No  Gastrointestinal Nausea?: No Vomiting?: No Indigestion/heartburn?: No Diarrhea?: No Constipation?: No  Constitutional Fever: No Night sweats?: Yes Weight loss?: Yes Fatigue?: Yes  Skin Skin rash/lesions?: No Itching?: No  Eyes Blurred vision?: No Double vision?: No  Ears/Nose/Throat Sore throat?: No Sinus problems?: No  Hematologic/Lymphatic Swollen glands?: No Easy bruising?: No  Cardiovascular Leg swelling?: No Chest pain?: Yes  Respiratory Cough?: Yes Shortness of breath?: Yes  Endocrine Excessive thirst?: No  Musculoskeletal Back pain?: Yes Joint pain?: No  Neurological Headaches?: Yes Dizziness?: Yes  Psychologic Depression?: Yes Anxiety?: Yes  Physical Exam: BP (!) 153/96   Pulse 82   Ht 6\' 3"  (1.905 m)   Wt 200 lb 4.8 oz (90.9 kg)   BMI 25.04 kg/m   Constitutional:  Alert and oriented, No acute distress. HEENT: [place] AT, moist mucus membranes.  Trachea midline, no masses. Cardiovascular: No clubbing, cyanosis, or  edema. Respiratory: Normal respiratory effort, no increased work of breathing. GI: Abdomen is soft, nontender, nondistended, no abdominal masses GU: No CVA tenderness. Normal phallus. Normal right testicle. There is a reactive hydrocele on the left side is approximately 4-5 cm it is firm. Mildly tender to palpation. No sign of infection of the scrotum or abscess. No crepitus. Skin: No rashes, bruises or suspicious lesions. Lymph: No cervical or inguinal adenopathy. Neurologic: Grossly intact, no focal deficits, moving all 4 extremities. Psychiatric: Normal mood and affect.  Laboratory Data: Lab Results  Component Value Date   WBC 7.0 01/01/2016   HGB 8.8 (L) 01/01/2016   HCT 25.3 (L) 01/01/2016   MCV 91.7 01/01/2016   PLT 234 01/01/2016    Lab Results  Component Value Date   CREATININE 0.92 01/03/2016    No results found for: PSA  No results found for: TESTOSTERONE  Lab Results  Component Value Date   HGBA1C 5.1 12/22/2015    Urinalysis    Component Value Date/Time   COLORURINE YELLOW (A) 12/31/2015 1240   APPEARANCEUR Cloudy (A) 02/10/2016 1315   LABSPEC 1.006 12/31/2015 1240   LABSPEC 1.006 02/11/2014 1716   PHURINE 7.0 12/31/2015 1240   GLUCOSEU Negative 02/10/2016 1315   GLUCOSEU Negative 02/11/2014 [place] 12/31/2015 1240   BILIRUBINUR Negative 02/10/2016 1315   BILIRUBINUR Negative 02/11/2014 [place] 12/31/2015 1240   PROTEINUR 1+ (A) 02/10/2016 1315   PROTEINUR NEGATIVE 12/31/2015 1240   NITRITE Negative 02/10/2016 1315   NITRITE NEGATIVE 12/31/2015 1240   LEUKOCYTESUR 2+ (A) 02/10/2016 1315   LEUKOCYTESUR Negative 02/11/2014 1716     Assessment & Plan:    1. Left reactive hydrocele I discussed the patient that he has a reactive hydrocele as a result of his recent left epididymoorchitis. We discussed that this can take an exceedingly long time to completely resolve. I have recommended that he wear tight fitting  underwear. I also recommended he takes ibuprofen on a scheduled basis 3 times a day for a few weeks. I also recommended icing his scrotum  to help with inflammation. He will follow-up with [place] if this  does not resolve in a few months. He will otherwise follow-up as previously scheduled.   Return for as previously scheduled.  Nickie Retort, MD  [facility] [address] [place], [place] [address] [phone]

## 2016-03-29 ENCOUNTER — Other Ambulatory Visit: Payer: Self-pay

## 2016-03-29 ENCOUNTER — Encounter: Payer: Self-pay | Admitting: Gastroenterology

## 2016-03-29 ENCOUNTER — Ambulatory Visit (INDEPENDENT_AMBULATORY_CARE_PROVIDER_SITE_OTHER): Payer: Medicare HMO | Admitting: Gastroenterology

## 2016-03-29 VITALS — BP 145/96 | HR 85 | Temp 97.6°F | Ht 75.0 in | Wt 205.0 lb

## 2016-03-29 DIAGNOSIS — R6881 Early satiety: Secondary | ICD-10-CM | POA: Diagnosis not present

## 2016-03-29 DIAGNOSIS — R634 Abnormal weight loss: Secondary | ICD-10-CM | POA: Diagnosis not present

## 2016-03-29 MED ORDER — PEG 3350-KCL-NA BICARB-NACL 420 G PO SOLR: 4000.0000 mL | ORAL | 0 refills | Status: DC
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
## 2016-04-07 ENCOUNTER — Telehealth: Payer: Self-pay

## 2016-04-07 DIAGNOSIS — R262 Difficulty in walking, not elsewhere classified: Secondary | ICD-10-CM | POA: Insufficient documentation

## 2016-04-07 DIAGNOSIS — M545 Low back pain, unspecified: Secondary | ICD-10-CM | POA: Insufficient documentation

## 2016-04-07 DIAGNOSIS — G8929 Other chronic pain: Secondary | ICD-10-CM | POA: Insufficient documentation

## 2016-04-07 NOTE — Telephone Encounter (Signed)
Gastroenterology Pre-Procedure Review  Request Date:  Requesting Physician: Dr.   PATIENT REVIEW QUESTIONS: The patient responded to the following health history questions as indicated:    1. Are you having any GI issues? no 2. Do you have a personal history of Polyps? yes (Hx of polyps) 3. Do you have a family history of Colon Cancer or Polyps? no 4. Diabetes Mellitus? yes (Type 2) 5. Joint replacements in the past 12 months?no 6. Major health problems in the past 3 months?no 7. Any artificial heart valves, MVP, or defibrillator?yes (Quad bypass - Sept 2017)    MEDICATIONS & ALLERGIES:    Patient reports the following regarding taking any anticoagulation/antiplatelet therapy:   Plavix, Coumadin, Eliquis, Xarelto, Lovenox, Pradaxa, Brilinta, or Effient? yes (Plavix 75mg ) Aspirin? no  Patient confirms/reports the following medications:  Current Outpatient Prescriptions  Medication Sig Dispense Refill  . acetaminophen (TYLENOL) 500 MG tablet Take 1,000 mg by mouth every 6 (six) hours as needed (pain).    Marland Kitchen atorvastatin (LIPITOR) 40 MG tablet Take 1 tablet (40 mg total) by mouth daily at 6 PM. 30 tablet 3  . buPROPion (WELLBUTRIN XL) 300 MG 24 hr tablet Take 300 mg by mouth daily. Takes at noon.    . Carbamazepine (EQUETRO) 300 MG CP12 Take 300 mg by mouth 2 (two) times daily.     . carvedilol (COREG) 3.125 MG tablet   2  . clopidogrel (PLAVIX) 75 MG tablet Take 75 mg by mouth daily.    . finasteride (PROSCAR) 5 MG tablet Take 5 mg by mouth daily.    . fluticasone (FLONASE) 50 MCG/ACT nasal spray Place 1 spray into both nostrils daily.    Marland Kitchen LORazepam (ATIVAN) 1 MG tablet Take 1-2 tablets (1-2 mg total) by mouth 2 (two) times daily. Pt takes one tablet in the morning and two at bedtime. 30 tablet 0  . paliperidone (INVEGA) 6 MG 24 hr tablet Take 6 mg by mouth at bedtime.     . pantoprazole (PROTONIX) 40 MG tablet Take 40 mg by mouth daily. Takes at noon.    . polyethylene  glycol-electrolytes (TRILYTE) 420 g solution Take 4,000 mLs by mouth as directed. Drink one 8 oz glass every 20 mins until stools are clear 4000 mL 0  . tamsulosin (FLOMAX) 0.4 MG CAPS capsule Take 0.4 mg by mouth.    . topiramate (TOPAMAX) 100 MG tablet Take 100 mg by mouth 3 (three) times daily. Takes at noon.     No current facility-administered medications for this visit.     Patient confirms/reports the following allergies:  Allergies  Allergen Reactions  . Morphine And Related Nausea And Vomiting  . Synalgos-Dc [Aspirin-Caff-Dihydrocodeine] Itching and Other (See Comments)    Reaction:  Stinging   . Synchlor A [Chlorpheniramine] Rash    Patient doesn't recall this allergy.    No orders of the defined types were placed in this encounter.   AUTHORIZATION INFORMATION Primary Insurance: 1D#: Group #:  Secondary Insurance: 1D#: Group #:  SCHEDULE INFORMATION: Date:  Time: Location: [place]

## 2016-04-08 DIAGNOSIS — F250 Schizoaffective disorder, bipolar type: Secondary | ICD-10-CM | POA: Diagnosis not present

## 2016-05-19 ENCOUNTER — Telehealth: Payer: Self-pay | Admitting: Cardiovascular Disease

## 2016-05-19 NOTE — Telephone Encounter (Signed)
Patient is seeing Nehemiah Massed at [facility].  Deleting recall.

## 2016-05-30 DIAGNOSIS — Z0000 Encounter for general adult medical examination without abnormal findings: Secondary | ICD-10-CM | POA: Diagnosis not present

## 2016-05-30 DIAGNOSIS — K219 Gastro-esophageal reflux disease without esophagitis: Secondary | ICD-10-CM | POA: Diagnosis not present

## 2016-05-30 DIAGNOSIS — I10 Essential (primary) hypertension: Secondary | ICD-10-CM | POA: Diagnosis not present

## 2016-05-30 DIAGNOSIS — E784 Other hyperlipidemia: Secondary | ICD-10-CM | POA: Diagnosis not present

## 2016-05-30 DIAGNOSIS — E119 Type 2 diabetes mellitus without complications: Secondary | ICD-10-CM | POA: Diagnosis not present

## 2016-06-01 DIAGNOSIS — I10 Essential (primary) hypertension: Secondary | ICD-10-CM | POA: Diagnosis not present

## 2016-06-01 DIAGNOSIS — K219 Gastro-esophageal reflux disease without esophagitis: Secondary | ICD-10-CM | POA: Diagnosis not present

## 2016-06-01 DIAGNOSIS — I2510 Atherosclerotic heart disease of native coronary artery without angina pectoris: Secondary | ICD-10-CM | POA: Diagnosis not present

## 2016-06-01 DIAGNOSIS — E782 Mixed hyperlipidemia: Secondary | ICD-10-CM | POA: Diagnosis not present

## 2016-06-01 DIAGNOSIS — G4730 Sleep apnea, unspecified: Secondary | ICD-10-CM | POA: Diagnosis not present

## 2016-06-01 DIAGNOSIS — R42 Dizziness and giddiness: Secondary | ICD-10-CM | POA: Diagnosis not present

## 2016-06-13 ENCOUNTER — Telehealth: Payer: Self-pay

## 2016-06-13 NOTE — Telephone Encounter (Signed)
Pt wife called stating pt has a UTI. Offered pt a nurse visit today. Wife declined and requested an appt for Thursday 3/22. Pt was added to nurse schedule then.

## 2016-06-16 ENCOUNTER — Ambulatory Visit (INDEPENDENT_AMBULATORY_CARE_PROVIDER_SITE_OTHER): Payer: Medicare Other

## 2016-06-16 VITALS — BP 180/100 | HR 73 | Ht 75.0 in | Wt 208.7 lb

## 2016-06-16 DIAGNOSIS — N390 Urinary tract infection, site not specified: Secondary | ICD-10-CM

## 2016-06-16 LAB — MICROSCOPIC EXAMINATION
RBC, UA: NONE SEEN /hpf (ref 0–?)
WBC, UA: >30 /hpf — ABNORMAL HIGH (ref 0–?)

## 2016-06-16 LAB — URINALYSIS, COMPLETE
BILIRUBIN UA: NEGATIVE
Glucose, UA: NEGATIVE
Ketones, UA: NEGATIVE
Nitrite, UA: NEGATIVE
SPEC GRAV UA: 1.025 (ref 1.005–1.030)
Urobilinogen, Ur: 0.2 mg/dL (ref 0.2–1.0)
pH, UA: 5.5 (ref 5.0–7.5)

## 2016-06-16 NOTE — Progress Notes (Signed)
Pt presents today with c/o urinary frequency and urgency, hard to postpone urination, dysuria, leakage of urine, hematuria, back pain, night sweats, and chills. Pt declined a cath specimen. Therefore a clean catch was obtained for u/a and cx.   Blood pressure (!) 180/100, pulse 73, height 6\' 3"  (1.905 m), weight 208 lb 11.2 oz (94.7 kg).

## 2016-06-19 LAB — CULTURE, URINE COMPREHENSIVE

## 2016-06-20 ENCOUNTER — Telehealth: Payer: Self-pay

## 2016-06-20 ENCOUNTER — Ambulatory Visit (INDEPENDENT_AMBULATORY_CARE_PROVIDER_SITE_OTHER): Payer: Medicare Other

## 2016-06-20 ENCOUNTER — Ambulatory Visit: Admit: 2016-06-20 | Payer: Medicare HMO | Admitting: Ophthalmology

## 2016-06-20 VITALS — BP 140/96 | HR 85 | Ht 75.0 in | Wt 205.0 lb

## 2016-06-20 DIAGNOSIS — R339 Retention of urine, unspecified: Secondary | ICD-10-CM | POA: Diagnosis not present

## 2016-06-20 SURGERY — PHACOEMULSIFICATION, CATARACT, WITH IOL INSERTION
Anesthesia: Topical | Laterality: Left

## 2016-06-20 NOTE — Telephone Encounter (Signed)
-----   Message from Nori Riis, PA-C sent at 06/19/2016  8:41 PM EDT ----- Please notify the patient that his urine culture was negative for infection. He does have a history of large postvoid residuals and has had to cath himself in the past. He should come in and have a bladder scan to make sure he is not back into retention.

## 2016-06-20 NOTE — Telephone Encounter (Signed)
Spoke with pt in reference to -ucx and needing a bladder scan. Pt voiced understanding. Pt was added to nurse schedule today for PVR.

## 2016-06-20 NOTE — Progress Notes (Signed)
Bladder Scan: 272 Patient can void:  Performed By: Toniann Fail, LPN   Per Larene Beach pt should perform CIC once daily indefinitely. Made pt aware a new order for catheters will be sent to [facility]. Pt voiced understanding.   Blood pressure (!) 140/96, pulse 85, height 6\' 3"  (1.905 m), weight 205 lb (93 kg).

## 2016-06-21 DIAGNOSIS — I10 Essential (primary) hypertension: Secondary | ICD-10-CM | POA: Diagnosis not present

## 2016-06-21 DIAGNOSIS — E782 Mixed hyperlipidemia: Secondary | ICD-10-CM | POA: Diagnosis not present

## 2016-06-21 DIAGNOSIS — I2510 Atherosclerotic heart disease of native coronary artery without angina pectoris: Secondary | ICD-10-CM | POA: Diagnosis not present

## 2016-06-21 DIAGNOSIS — R2681 Unsteadiness on feet: Secondary | ICD-10-CM | POA: Diagnosis not present

## 2016-06-29 ENCOUNTER — Telehealth: Payer: Self-pay | Admitting: Urology

## 2016-06-29 NOTE — Telephone Encounter (Signed)
Pt called office voicing concerns of not yet receiving his catheters.  Pt states he isn't in need of catheters just yet but wanted to follow up on his Rx since he has not received them yet. Please advise. Thanks.

## 2016-06-29 NOTE — Telephone Encounter (Signed)
In contact with [facility] about order.

## 2016-07-04 NOTE — Telephone Encounter (Signed)
LMOM

## 2016-07-05 DIAGNOSIS — M5416 Radiculopathy, lumbar region: Secondary | ICD-10-CM | POA: Diagnosis not present

## 2016-07-05 DIAGNOSIS — M48062 Spinal stenosis, lumbar region with neurogenic claudication: Secondary | ICD-10-CM | POA: Diagnosis not present

## 2016-07-05 DIAGNOSIS — M5126 Other intervertebral disc displacement, lumbar region: Secondary | ICD-10-CM | POA: Diagnosis not present

## 2016-07-05 NOTE — Telephone Encounter (Signed)
Spoke with pt and made aware catheters were reordered and should be hearing from [facility] soon. Pt voiced understanding.

## 2016-07-19 DIAGNOSIS — I2510 Atherosclerotic heart disease of native coronary artery without angina pectoris: Secondary | ICD-10-CM | POA: Diagnosis not present

## 2016-07-19 DIAGNOSIS — E782 Mixed hyperlipidemia: Secondary | ICD-10-CM | POA: Diagnosis not present

## 2016-07-19 DIAGNOSIS — I10 Essential (primary) hypertension: Secondary | ICD-10-CM | POA: Diagnosis not present

## 2016-07-21 DIAGNOSIS — R262 Difficulty in walking, not elsewhere classified: Secondary | ICD-10-CM | POA: Diagnosis not present

## 2016-07-21 DIAGNOSIS — G2119 Other drug induced secondary parkinsonism: Secondary | ICD-10-CM | POA: Diagnosis not present

## 2016-07-22 DIAGNOSIS — G2119 Other drug induced secondary parkinsonism: Secondary | ICD-10-CM | POA: Insufficient documentation

## 2016-07-26 ENCOUNTER — Telehealth: Payer: Self-pay | Admitting: Urology

## 2016-07-26 NOTE — Telephone Encounter (Signed)
He needs to CIC more than once a day. I suggest tid.  He can stop the Flomax.

## 2016-07-26 NOTE — Telephone Encounter (Signed)
Pt called and said he's going to the bathroom too much and wants to know if he can stop Tamsulosin.  Please call pt and let him know [phone]

## 2016-07-26 NOTE — Telephone Encounter (Signed)
Spoke with pt in reference to wanting to stop flomax. Pt stated that he is getting up all hours of the night and urinates a lot during the day. Pt stated that he doing CIC in the mornings. Pt then denied dysuria, n/v, f/c. Please advise.

## 2016-07-27 NOTE — Telephone Encounter (Signed)
LMOM

## 2016-07-28 DIAGNOSIS — F250 Schizoaffective disorder, bipolar type: Secondary | ICD-10-CM | POA: Diagnosis not present

## 2016-07-28 NOTE — Telephone Encounter (Signed)
Made appt and left patient a message to CB to confirm and mailed it to him  michelle

## 2016-07-28 NOTE — Telephone Encounter (Signed)
Spoke with pt in reference to performing CIC tid and stopping flomax. Pt voiced understanding.  Reinforced with pt f/u appt 10/26/16 with Dr. Erlene Quan. Pt stated that he is not able to keep that appt and would like to make one with [facility] sooner. Attempted to transfer call without success. Can you schedule pt with Larene Beach 4-6 weeks?

## 2016-08-02 DIAGNOSIS — M5416 Radiculopathy, lumbar region: Secondary | ICD-10-CM | POA: Diagnosis not present

## 2016-08-02 DIAGNOSIS — M5126 Other intervertebral disc displacement, lumbar region: Secondary | ICD-10-CM | POA: Diagnosis not present

## 2016-08-02 DIAGNOSIS — M48062 Spinal stenosis, lumbar region with neurogenic claudication: Secondary | ICD-10-CM | POA: Diagnosis not present

## 2016-08-19 ENCOUNTER — Ambulatory Visit: Payer: Medicaid Other | Admitting: Podiatry

## 2016-08-24 DIAGNOSIS — I2510 Atherosclerotic heart disease of native coronary artery without angina pectoris: Secondary | ICD-10-CM | POA: Diagnosis not present

## 2016-08-24 DIAGNOSIS — G4733 Obstructive sleep apnea (adult) (pediatric): Secondary | ICD-10-CM | POA: Diagnosis not present

## 2016-08-24 DIAGNOSIS — I10 Essential (primary) hypertension: Secondary | ICD-10-CM | POA: Diagnosis not present

## 2016-08-25 ENCOUNTER — Ambulatory Visit: Payer: Medicare Other | Admitting: Urology

## 2016-08-29 DIAGNOSIS — D509 Iron deficiency anemia, unspecified: Secondary | ICD-10-CM | POA: Diagnosis not present

## 2016-08-29 DIAGNOSIS — E781 Pure hyperglyceridemia: Secondary | ICD-10-CM | POA: Diagnosis not present

## 2016-08-29 DIAGNOSIS — F329 Major depressive disorder, single episode, unspecified: Secondary | ICD-10-CM | POA: Diagnosis not present

## 2016-08-29 DIAGNOSIS — I10 Essential (primary) hypertension: Secondary | ICD-10-CM | POA: Diagnosis not present

## 2016-08-29 DIAGNOSIS — I2510 Atherosclerotic heart disease of native coronary artery without angina pectoris: Secondary | ICD-10-CM | POA: Diagnosis not present

## 2016-08-29 DIAGNOSIS — I158 Other secondary hypertension: Secondary | ICD-10-CM | POA: Diagnosis not present

## 2016-08-29 DIAGNOSIS — F319 Bipolar disorder, unspecified: Secondary | ICD-10-CM | POA: Diagnosis not present

## 2016-08-29 DIAGNOSIS — G20 Parkinson's disease: Secondary | ICD-10-CM | POA: Diagnosis not present

## 2016-08-29 DIAGNOSIS — E559 Vitamin D deficiency, unspecified: Secondary | ICD-10-CM | POA: Diagnosis not present

## 2016-08-29 DIAGNOSIS — E784 Other hyperlipidemia: Secondary | ICD-10-CM | POA: Diagnosis not present

## 2016-08-29 DIAGNOSIS — N400 Enlarged prostate without lower urinary tract symptoms: Secondary | ICD-10-CM | POA: Diagnosis not present

## 2016-09-01 DIAGNOSIS — R0602 Shortness of breath: Secondary | ICD-10-CM | POA: Diagnosis not present

## 2016-09-01 DIAGNOSIS — G4733 Obstructive sleep apnea (adult) (pediatric): Secondary | ICD-10-CM | POA: Diagnosis not present

## 2016-09-02 DIAGNOSIS — G4733 Obstructive sleep apnea (adult) (pediatric): Secondary | ICD-10-CM | POA: Diagnosis not present

## 2016-09-02 DIAGNOSIS — R0602 Shortness of breath: Secondary | ICD-10-CM | POA: Diagnosis not present

## 2016-09-12 NOTE — Progress Notes (Deleted)
09/13/2016 9:26 AM   Nena Polio Mar 17, 1951 [id number]  Referring provider: Casilda Carls, [address] [place], [place] [address]  No chief complaint on file.   HPI: 66 yo WM with a history of hematuria, epididymitis and urinary retention managed with CIC who presents today requesting an urgent appointment.  Location Severity Quality Context Timing Duration Modifying Factors Associated signs/symptoms            OR Status of 3 chronic or inactive problems  History of hematuria Patient underwent a hematuria work up in 05/2015 and no GU malignancies were found.  He has not seen any gross hematuria.       PMH: Past Medical History:  Diagnosis Date  . Abdominal pain   . Acute cystitis without hematuria   . Anemia   . Anginal pain ([place])   . Arthritis   . Bipolar disorder ([place])    BIPOLAR/ SCHIZO/ AFFECTIVE   . BPD (bronchopulmonary dysplasia)   . BPH (benign prostatic hyperplasia)   . CAD (coronary artery disease)    a. s/p MI 2001, 2012;  b. s/p prior LCX stenting; b. 05/2012 Abnl MV; c. 05/2012 Cath: patent LCX stent w/ otw mod non-obs dzs->Med Rx.  . Carpal tunnel syndrome   . Disc degeneration, lumbar   . Elevated PSA   . Esophageal reflux   . GERD (gastroesophageal reflux disease)   . Hematuria    history of gross and microscopic  . Hemorrhoid   . History of balanitis   . History of bladder infections   . History of echocardiogram    a. 07/2014 Echo: EF 60-65%, no rwma, nl LA size, nl RV/PASP.  Marland Kitchen History of urethral stricture   . Hyperlipidemia   . Hypertension   . Hypertensive heart disease   . Incomplete bladder emptying   . Iron deficiency anemia, unspecified   . Lower urinary tract symptoms   . Myocardial infarction    X7  . Neuropathy ([place])   . Nocturia   . Obsessive compulsive disorder   . Other dysphagia   . Panic attack   . Parkinson's disease ([place])   . Sleep apnea    SURGICAL TX  . Stroke ([place])   . Type II diabetes mellitus  ([place])   . Unspecified vitamin D deficiency   . Urethral stricture   . Urinary retention   . Varicose vein of leg   . Vitamin D deficiency     Surgical History: Past Surgical History:  Procedure Laterality Date  . BACK SURGERY     lumbar disc  . BACK SURGERY    . CARDIAC CATHETERIZATION  6/10;2011;Aug.2012;Oct. 2012;   . CARDIAC CATHETERIZATION  05/2012   [facility]; EF 60%, patent stent in the left circumflex, moderate three-vessel disease with no flow-limiting lesions. Unchanged from most recent catheterization  . CARDIAC CATHETERIZATION N/A 12/10/2015   Procedure: Left Heart Cath and Coronary Angiography;  Surgeon: Corey Skains, MD;  Location: [facility];  Service: Cardiovascular;  Laterality: N/A;  . CARPAL TUNNEL RELEASE  2009   left  . CORONARY ANGIOPLASTY  2011 & 2001   s/p stent  . CORONARY ARTERY BYPASS GRAFT     stent placement  . CORONARY ARTERY BYPASS GRAFT N/A 12/25/2015   Procedure: CORONARY ARTERY BYPASS GRAFTING (CABG) x4  , using left internal mammary artery and right leg greater saphenous vein harvested endoscopically LIMA-LAD SVG-OM2 SVG-DIAG SVG-PD;  Surgeon: Melrose Nakayama, MD;  Location: [facility];  Service: Open Heart Surgery;  Laterality: N/A;  . [place]  . CYST REMOVAL LEG Right   . SKIN GRAFT    . TEE WITHOUT CARDIOVERSION N/A 12/25/2015   Procedure: TRANSESOPHAGEAL ECHOCARDIOGRAM (TEE);  Surgeon: Melrose Nakayama, MD;  Location: [facility];  Service: Open Heart Surgery;  Laterality: N/A;  . TONSILLECTOMY      Home Medications:  Allergies as of 09/13/2016      Reactions   Morphine And Related Nausea And Vomiting   Synalgos-dc [aspirin-caff-dihydrocodeine] Itching, Other (See Comments)   Reaction:  Stinging    Synchlor A [chlorpheniramine] Rash   Patient doesn't recall this allergy.      Medication List       Accurate as of 09/12/16  9:26 AM. Always use your most recent med list.          acetaminophen 500 MG  tablet Commonly known as:  TYLENOL Take 1,000 mg by mouth every 6 (six) hours as needed (pain).   atorvastatin 40 MG tablet Commonly known as:  LIPITOR Take 1 tablet (40 mg total) by mouth daily at 6 PM.   buPROPion 300 MG 24 hr tablet Commonly known as:  WELLBUTRIN XL Take 300 mg by mouth daily. Takes at noon.   Carbamazepine 300 MG Cp12 Commonly known as:  Equetro Take 300 mg by mouth 2 (two) times daily.   carvedilol 3.125 MG tablet Commonly known as:  COREG   clopidogrel 75 MG tablet Commonly known as:  PLAVIX Take 75 mg by mouth daily.   finasteride 5 MG tablet Commonly known as:  PROSCAR Take 5 mg by mouth daily.   fluticasone 50 MCG/ACT nasal spray Commonly known as:  FLONASE Place 1 spray into both nostrils daily.   LORazepam 1 MG tablet Commonly known as:  ATIVAN Take 1-2 tablets (1-2 mg total) by mouth 2 (two) times daily. Pt takes one tablet in the morning and two at bedtime.   paliperidone 6 MG 24 hr tablet Commonly known as:  INVEGA Take 6 mg by mouth at bedtime.   pantoprazole 40 MG tablet Commonly known as:  PROTONIX Take 40 mg by mouth daily. Takes at noon.   polyethylene glycol-electrolytes 420 g solution Commonly known as:  TRILYTE Take 4,000 mLs by mouth as directed. Drink one 8 oz glass every 20 mins until stools are clear   tamsulosin 0.4 MG Caps capsule Commonly known as:  FLOMAX Take 0.4 mg by mouth.   topiramate 100 MG tablet Commonly known as:  TOPAMAX Take 100 mg by mouth 3 (three) times daily. Takes at noon.       Allergies:  Allergies  Allergen Reactions  . Morphine And Related Nausea And Vomiting  . Synalgos-Dc [Aspirin-Caff-Dihydrocodeine] Itching and Other (See Comments)    Reaction:  Stinging   . Synchlor A [Chlorpheniramine] Rash    Patient doesn't recall this allergy.    Family History: Family History  Problem Relation Age of Onset  . Heart disease Mother   . Heart disease Brother   . Prostate cancer Father    . Heart disease Unknown   . Heart disease Brother   . Kidney disease Neg Hx     Social History:  reports that he quit smoking about 17 years ago. His smoking use included Cigarettes. He has a 7.50 pack-year smoking history. He has never used smokeless tobacco. He reports that he does not drink alcohol or use drugs.  ROS:  Physical Exam: There were no vitals taken for this visit.  Constitutional: Well nourished. Alert and oriented, No acute distress. HEENT: [place] AT, moist mucus membranes. Trachea midline, no masses. Cardiovascular: No clubbing, cyanosis, or edema. Respiratory: Normal respiratory effort, no increased work of breathing. GI: Abdomen is soft, non tender, non distended, no abdominal masses. Liver and spleen not palpable.  No hernias appreciated.  Stool sample for occult testing is not indicated.   GU: No CVA tenderness.  No bladder fullness or masses.  Patient with circumcised/uncircumcised phallus. ***Foreskin easily retracted***  Urethral meatus is patent.  No penile discharge. No penile lesions or rashes. Scrotum without lesions, cysts, rashes and/or edema.  Testicles are located scrotally bilaterally. No masses are appreciated in the testicles. Left and right epididymis are normal. Rectal: Patient with  normal sphincter tone. Anus and perineum without scarring or rashes. No rectal masses are appreciated. Prostate is approximately *** grams, *** nodules are appreciated. Seminal vesicles are normal. Skin: No rashes, bruises or suspicious lesions. Lymph: No cervical or inguinal adenopathy. Neurologic: Grossly intact, no focal deficits, moving all 4 extremities. Psychiatric: Normal mood and affect.  Laboratory Data: PSA History  0.4 in 10/2014  0.6 in 09/2015   Lab Results  Component Value Date   WBC 7.0 01/01/2016   HGB 8.8 (L) 01/01/2016   HCT 25.3 (L) 01/01/2016   MCV 91.7 01/01/2016   PLT 234 01/01/2016     Lab Results  Component Value Date   CREATININE 0.92 01/03/2016    Lab Results  Component Value Date   HGBA1C 5.1 12/22/2015    Lab Results  Component Value Date   TSH 0.682 07/28/2015       Component Value Date/Time   CHOL 187 08/24/2014 0604   CHOL 124 01/31/2014 0448   HDL 39 (L) 08/24/2014 0604   HDL 25 (L) 01/31/2014 0448   CHOLHDL 4.8 08/24/2014 0604   VLDL 30 08/24/2014 0604   VLDL 23 01/31/2014 0448   LDLCALC 118 (H) 08/24/2014 0604   LDLCALC 76 01/31/2014 0448    Lab Results  Component Value Date   AST 20 12/31/2015   Lab Results  Component Value Date   ALT 21 12/31/2015    Urinalysis ***  Pertinent Imaging: ***  Assessment & Plan:  ***    No Follow-up on file.  These notes generated with voice recognition software. I apologize for typographical errors.  Zara Council, [facility] [address], [place], [place] [address] [phone]

## 2016-09-13 ENCOUNTER — Ambulatory Visit: Payer: Medicare Other | Admitting: Urology

## 2016-09-15 ENCOUNTER — Other Ambulatory Visit: Payer: Self-pay | Admitting: *Deleted

## 2016-09-15 ENCOUNTER — Encounter: Payer: Self-pay | Admitting: *Deleted

## 2016-09-16 ENCOUNTER — Ambulatory Visit (INDEPENDENT_AMBULATORY_CARE_PROVIDER_SITE_OTHER): Payer: Medicare Other | Admitting: Internal Medicine

## 2016-09-16 ENCOUNTER — Institutional Professional Consult (permissible substitution): Payer: Medicaid Other | Admitting: Internal Medicine

## 2016-09-16 ENCOUNTER — Encounter: Payer: Self-pay | Admitting: Internal Medicine

## 2016-09-16 VITALS — BP 136/82 | HR 82 | Resp 16 | Ht 75.0 in | Wt 205.0 lb

## 2016-09-16 DIAGNOSIS — R918 Other nonspecific abnormal finding of lung field: Secondary | ICD-10-CM

## 2016-09-16 DIAGNOSIS — R0602 Shortness of breath: Secondary | ICD-10-CM | POA: Diagnosis not present

## 2016-09-16 MED ORDER — BUDESONIDE-FORMOTEROL FUMARATE 160-4.5 MCG/ACT IN AERO: 2.0000 | INHALATION_SPRAY | Freq: Two times a day (BID) | RESPIRATORY_TRACT | 0 refills | Status: DC

## 2016-09-16 MED ORDER — BUDESONIDE-FORMOTEROL FUMARATE 160-4.5 MCG/ACT IN AERO: 2.0000 | INHALATION_SPRAY | Freq: Two times a day (BID) | RESPIRATORY_TRACT | 12 refills | Status: DC

## 2016-09-16 NOTE — Progress Notes (Signed)
[facility] Consultation      MRN# [id number] JOHNNIE GOYNES 1951-02-24   CC: Chief Complaint  Patient presents with  . Shortness of Breath    former VM patient: Pt report sob has gotten worse especially with exhertion. He has sl. chest tighness.      Brief History: 66 yo male former smoker seen for Multiple pulmonary nodules.    Events since last clinic visit: Patient has chronic cough. And chronic shortness of breath and dyspnea on exertion  had quadruple coronary artery bypass surgery.  No fever, no chills, no sweats. Cough is dry with no significant nasal drainage. Had a CT chest for pulmonary nodules done on 12/1  No signs of infection at this time No signs of acute heart failure at this time  Previous assessment of his dyspnea on exertion was related to deconditioned state  Prior PFTs show scooping of the expiratory limb suggestive of obstruction but normal ratio and normal FEV1      Review of Systems  Constitutional: Negative for chills and fever.  Eyes: Negative for blurred vision.  Respiratory: Positive for cough and shortness of breath. Negative for hemoptysis, sputum production and wheezing.   Cardiovascular: Positive for orthopnea. Negative for chest pain and palpitations.  Neurological: Negative for dizziness and headaches.  Endo/Heme/Allergies: Does not bruise/bleed easily.  Psychiatric/Behavioral: Negative for depression and substance abuse.      Allergies:  Morphine and related; Synalgos-dc [aspirin-caff-dihydrocodeine]; and Synchlor a [chlorpheniramine]  Physical Examination:  VS: BP 136/82 (BP Location: Left Arm, Cuff Size: Normal)   Pulse 82   SpO2 97%   General Appearance: No distress  HEENT: PERRLA, no ptosis, no other lesions noticed Pulmonary:normal breath sounds., diaphragmatic excursion normal.No wheezing, No rales   Cardiovascular:  Normal S1,S2.  No m/r/g.     Abdomen:Exam: Benign, Soft, non-tender, No masses    Skin:   warm, no rashes, no ecchymosis  Extremities: normal, no cyanosis, clubbing, warm with normal capillary refill.       CT Chest 02/26/16 I have Independently reviewed images of  CT chest  on 09/16/2016 Interpretation: Stable small solid appearing pulmonary nodules. The largest measures 6 mm.  Assessment and Plan: 66 year old male with multiple bilateral benign subcentimeter pulmonary nodules seen for follow-up visit in the setting of chronic shortness of breath and chronic dyspnea and exertion in the setting of deconditioned state  Pulmonary nodules Repeat CT chest at last OV showed stable pulmonary nodules, the left lower lobe dominant nodule is now 6 mm. I believe at this point in the majority is nodules are benign reactive nodules, he has had adequate follow-up, we will consider a repeat CT in 6 months  Plan: -Resolution of some pulmonary nodules, dominant left lower lobe nodule 6 mm and stable in size -Follow-up in 6 months with repeat CT chest   Dyspnea likely related to deconditioned state Stable dyspnea on exertion. I suspect deconditioning is may be adding to the level of dyspnea is having. Primary function tests and 6 minute walk test within acceptable limits. Pulmonary function test showed FEV1 of 89% FEV1/FVC 79%, flow loops with mild flattening at end expiratory showing may be some early signs of obstruction.  He does have significant coronary artery disease and recently had a quadruple bypass surgery. -Recommend starting Symbicort 160 twice daily and assess his respiratory status in 6 months - also check echocardiogram to assess LV function and diastolic dysfunction  Allergic rhinitis, seasonal Symptoms today consistent mostly with allergic rhinitis  and postnasal drip. He does have Flonase at home that he uses once per day to each nostril  Plan: -One spray to each nostril in the morning and at night for the next 3-4 days then stop, if symptoms continue after 2  days and restart another cycle of Flonase twice a day  Deconditioned state -Exercise as tolerated, 30 minutes of walking at a moderate pace at least 3 times per week as tolerated    Patient/Family are satisfied with Plan of action and management. All questions answered Follow-up in 6 months   Daleena Rotter Patricia Pesa, M.D.  [facility]  Medical Director [place] Director [facility] Cardio-Pulmonary Department

## 2016-09-16 NOTE — Patient Instructions (Signed)
Start Symbicort Check echocardiogram

## 2016-09-18 NOTE — Progress Notes (Signed)
09/19/2016 10:40 AM   Roger Pruitt 1950/04/18 [id number]  Referring provider: Casilda Carls, [address] [place], [place] [address]  Chief Complaint  Patient presents with  . Hydrocele    last appointment 12/17 wanted to see provider sooner than original followup  . Follow-up    incomplete bladder emptying    HPI: 66 yo WM with a history of hematuria, epididymitis and urinary retention managed with CIC who presents today requesting an urgent appointment.  He was last seen in our office for epididymitis in 02/2016.  He states this has resolved.  He states that he has been having urgency, frequency and dysuria for the last two months.  He has not had suprapubic pain or gross hematuria.  He has not had recent fevers, chills, nausea or vomiting.  He is CIC x 3 daily.  His UA today demonstrates > 30 WBC's and many bacteria.  This is not a cathed specimen.    BPH WITH LUTS  (prostate and/or bladder) His IPSS score today is 16, which is moderate lower urinary tract symptomatology.  He is unhappy with his quality life due to his urinary symptoms.  His PVR is 170 mL.  His major complaints today are frequency, urgency, dysuria, nocturia and incontinence .  He has had these symptoms for two months.  He denies any dysuria, hematuria or suprapubic pain.   He currently taking tamsulosin 0.4 mg daily.   He also denies any recent fevers, chills, nausea or vomiting.  His father had lethal prostate cancer.       IPSS    Row Name 09/19/16 0900         International Prostate Symptom Score   How often have you had the sensation of not emptying your bladder? Less than 1 in 5     How often have you had to urinate less than every two hours? About half the time     How often have you found you stopped and started again several times when you urinated? More than half the time     How often have you found it difficult to postpone urination? Not at All     How often have you had a weak urinary  stream? Almost always     How often have you had to strain to start urination? Not at All     How many times did you typically get up at night to urinate? 3 Times     Total IPSS Score 16       Quality of Life due to urinary symptoms   If you were to spend the rest of your life with your urinary condition just the way it is now how would you feel about that? Unhappy        Score:  1-7 Mild 8-19 Moderate 20-35 Severe   History of hematuria Patient underwent a hematuria work up in 05/2015 and no GU malignancies were found.  He has not seen any gross hematuria.  Today's UA is negative for hematuria.  Erectile dysfunction His SHIM score is 5, which is severe ED.   He has been having difficulty with erections for a few years    His major complaint is no erections.  His libido is dimished.   His risk factors for ED are age, BPH, stroke, Parkinson's disease, DM, HTN, HLD, sleep apnea, CAD, stress, anxiety and depression and antidepressants.  He denies any painful erections or curvatures with his erections.   He is  not longer having spontaneous erections.       SHIM    Row Name 09/19/16 1000         SHIM: Over the last 6 months:   How do you rate your confidence that you could get and keep an erection? Very Low     When you had erections with sexual stimulation, how often were your erections hard enough for penetration (entering your partner)? Almost Never or Never     During sexual intercourse, how often were you able to maintain your erection after you had penetrated (entered) your partner? Almost Never or Never     During sexual intercourse, how difficult was it to maintain your erection to completion of intercourse? Extremely Difficult     When you attempted sexual intercourse, how often was it satisfactory for you? Almost Never or Never       SHIM Total Score   SHIM 5        Score: 1-7 Severe ED 8-11 Moderate ED 12-16 Mild-Moderate ED 17-21 Mild ED 22-25 No ED  PMH: Past  Medical History:  Diagnosis Date  . Abdominal pain   . Acute cystitis without hematuria   . Anemia   . Anginal pain ([place])   . Arthritis   . Bipolar disorder ([place])    BIPOLAR/ SCHIZO/ AFFECTIVE   . BPD (bronchopulmonary dysplasia)   . BPH (benign prostatic hyperplasia)   . CAD (coronary artery disease)    a. s/p MI 2001, 2012;  b. s/p prior LCX stenting; b. 05/2012 Abnl MV; c. 05/2012 Cath: patent LCX stent w/ otw mod non-obs dzs->Med Rx.  . Carpal tunnel syndrome   . Disc degeneration, lumbar   . Elevated PSA   . Esophageal reflux   . GERD (gastroesophageal reflux disease)   . Hematuria    history of gross and microscopic  . Hemorrhoid   . History of balanitis   . History of bladder infections   . History of echocardiogram    a. 07/2014 Echo: EF 60-65%, no rwma, nl LA size, nl RV/PASP.  Marland Kitchen History of urethral stricture   . Hyperlipidemia   . Hypertension   . Hypertensive heart disease   . Incomplete bladder emptying   . Iron deficiency anemia, unspecified   . Lower urinary tract symptoms   . Myocardial infarction ([place])    X7  . Neuropathy   . Nocturia   . Obsessive compulsive disorder   . Other dysphagia   . Panic attack   . Parkinson's disease ([place])   . Sleep apnea    SURGICAL TX  . Stroke ([place])   . Type II diabetes mellitus ([place])   . Unspecified vitamin D deficiency   . Urethral stricture   . Urinary retention   . Varicose vein of leg   . Vitamin D deficiency     Surgical History: Past Surgical History:  Procedure Laterality Date  . BACK SURGERY     lumbar disc  . BACK SURGERY    . CARDIAC CATHETERIZATION  6/10;2011;Aug.2012;Oct. 2012;   . CARDIAC CATHETERIZATION  05/2012   [facility]; EF 60%, patent stent in the left circumflex, moderate three-vessel disease with no flow-limiting lesions. Unchanged from most recent catheterization  . CARDIAC CATHETERIZATION N/A 12/10/2015   Procedure: Left Heart Cath and Coronary Angiography;  Surgeon: Corey Skains, MD;   Location: [facility];  Service: Cardiovascular;  Laterality: N/A;  . CARPAL TUNNEL RELEASE  2009   left  . CORONARY ANGIOPLASTY  2011 & 2001   s/p stent  . CORONARY ARTERY BYPASS GRAFT     stent placement  . CORONARY ARTERY BYPASS GRAFT N/A 12/25/2015   Procedure: CORONARY ARTERY BYPASS GRAFTING (CABG) x4  , using left internal mammary artery and right leg greater saphenous vein harvested endoscopically LIMA-LAD SVG-OM2 SVG-DIAG SVG-PD;  Surgeon: Melrose Nakayama, MD;  Location: [facility];  Service: Open Heart Surgery;  Laterality: N/A;  . [place]  . CYST REMOVAL LEG Right   . SKIN GRAFT    . TEE WITHOUT CARDIOVERSION N/A 12/25/2015   Procedure: TRANSESOPHAGEAL ECHOCARDIOGRAM (TEE);  Surgeon: Melrose Nakayama, MD;  Location: [facility];  Service: Open Heart Surgery;  Laterality: N/A;  . TONSILLECTOMY      Home Medications:  Allergies as of 09/19/2016      Reactions   Morphine And Related Nausea And Vomiting   Other reaction(s): Nausea And Vomiting   Synalgos-dc [aspirin-caff-dihydrocodeine] Itching, Other (See Comments)   Reaction:  Stinging    Synchlor A [chlorpheniramine] Rash   Patient doesn't recall this allergy.      Medication List       Accurate as of 09/19/16 10:40 AM. Always use your most recent med list.          acetaminophen 500 MG tablet Commonly known as:  TYLENOL Take 1,000 mg by mouth every 6 (six) hours as needed (pain).   amLODipine 5 MG tablet Commonly known as:  NORVASC Take 5 mg by mouth daily.   amoxicillin-clavulanate 875-125 MG tablet Commonly known as:  AUGMENTIN Take 1 tablet by mouth every 12 (twelve) hours.   atorvastatin 40 MG tablet Commonly known as:  LIPITOR Take 1 tablet (40 mg total) by mouth daily at 6 PM.   budesonide-formoterol 160-4.5 MCG/ACT inhaler Commonly known as:  SYMBICORT Inhale 2 puffs into the lungs 2 (two) times daily.   buPROPion 300 MG 24 hr tablet Commonly known as:  WELLBUTRIN XL Take  300 mg by mouth daily. Takes at noon.   Carbamazepine 300 MG Cp12 Commonly known as:  Equetro Take 300 mg by mouth 2 (two) times daily.   carvedilol 3.125 MG tablet Commonly known as:  COREG   clopidogrel 75 MG tablet Commonly known as:  PLAVIX Take 75 mg by mouth daily.   DEPEND ADJUSTABLE UNDERWEAR LG Misc Wear pads tid   doxepin 25 MG capsule Commonly known as:  SINEQUAN Take 25 mg by mouth daily.   fluticasone 50 MCG/ACT nasal spray Commonly known as:  FLONASE Place 1 spray into both nostrils daily.   gabapentin 300 MG capsule Commonly known as:  NEURONTIN Take 300 mg by mouth as directed.   JANUMET 50-1000 MG tablet Generic drug:  sitaGLIPtin-metformin Take 1 tablet by mouth 2 (two) times daily.   lisinopril 20 MG tablet Commonly known as:  PRINIVIL,ZESTRIL Take 20 mg by mouth daily.   LORazepam 1 MG tablet Commonly known as:  ATIVAN Take 1-2 tablets (1-2 mg total) by mouth 2 (two) times daily. Pt takes one tablet in the morning and two at bedtime.   pantoprazole 40 MG tablet Commonly known as:  PROTONIX Take 40 mg by mouth daily. Takes at noon.   polyethylene glycol-electrolytes 420 g solution Commonly known as:  TRILYTE Take 4,000 mLs by mouth as directed. Drink one 8 oz glass every 20 mins until stools are clear   risperidone 4 MG tablet Commonly known as:  RISPERDAL Take 4 mg by mouth daily.   tamsulosin  0.4 MG Caps capsule Commonly known as:  FLOMAX Take 0.4 mg by mouth.   topiramate 100 MG tablet Commonly known as:  TOPAMAX Take 100 mg by mouth 3 (three) times daily. Takes at noon.   traMADol 50 MG tablet Commonly known as:  ULTRAM Take 50 mg by mouth as directed.       Allergies:  Allergies  Allergen Reactions  . Morphine And Related Nausea And Vomiting    Other reaction(s): Nausea And Vomiting  . Synalgos-Dc [Aspirin-Caff-Dihydrocodeine] Itching and Other (See Comments)    Reaction:  Stinging   . Synchlor A [Chlorpheniramine] Rash     Patient doesn't recall this allergy.    Family History: Family History  Problem Relation Age of Onset  . Heart disease Mother   . Heart disease Brother   . Prostate cancer Father   . Heart disease Unknown   . Heart disease Brother   . Kidney disease Neg Hx   . Kidney cancer Neg Hx   . Bladder Cancer Neg Hx     Social History:  reports that he quit smoking about 17 years ago. His smoking use included Cigarettes. He has a 7.50 pack-year smoking history. He has never used smokeless tobacco. He reports that he does not drink alcohol or use drugs.  ROS: UROLOGY Frequent Urination?: Yes Hard to postpone urination?: Yes Burning/pain with urination?: Yes Get up at night to urinate?: Yes Leakage of urine?: Yes Urine stream starts and stops?: No Trouble starting stream?: No Do you have to strain to urinate?: No Blood in urine?: No Urinary tract infection?: Yes Sexually transmitted disease?: No Injury to kidneys or bladder?: No Painful intercourse?: No Weak stream?: No Erection problems?: Yes Penile pain?: No  Gastrointestinal Nausea?: No Vomiting?: No Indigestion/heartburn?: No Diarrhea?: No Constipation?: No  Constitutional Fever: No Night sweats?: No Weight loss?: Yes Fatigue?: No  Skin Skin rash/lesions?: No Itching?: No  Eyes Blurred vision?: Yes Double vision?: No  Ears/Nose/Throat Sore throat?: No Sinus problems?: No  Hematologic/Lymphatic Swollen glands?: No Easy bruising?: Yes  Cardiovascular Leg swelling?: No Chest pain?: No  Respiratory Cough?: No Shortness of breath?: Yes  Endocrine Excessive thirst?: Yes  Musculoskeletal Back pain?: Yes Joint pain?: No  Neurological Headaches?: No Dizziness?: Yes  Psychologic Depression?: Yes Anxiety?: No  Physical Exam: BP (!) 143/84   Pulse 85   Ht 6\' 3"  (1.905 m)   Wt 205 lb (93 kg)   BMI 25.62 kg/m   Constitutional: Well nourished. Alert and oriented, No acute distress. HEENT:  [place] AT, moist mucus membranes. Trachea midline, no masses. Cardiovascular: No clubbing, cyanosis, or edema. Respiratory: Normal respiratory effort, no increased work of breathing. GI: Abdomen is soft, non tender, non distended, no abdominal masses. Liver and spleen not palpable.  No hernias appreciated.  Stool sample for occult testing is not indicated.   GU: No CVA tenderness.  No bladder fullness or masses.  Patient with uncircumcised phallus. Foreskin easily retracted  Urethral meatus is patent.  No penile discharge. No penile lesions or rashes. Scrotum without lesions, cysts, rashes and/or edema.  Testicles are located scrotally bilaterally. No masses are appreciated in the testicles. Left and right epididymis are normal.  Left spermatocele is noted.   Rectal: Patient with  normal sphincter tone. Anus and perineum without scarring or rashes. No rectal masses are appreciated. Prostate is approximately 45 grams, firm in the right lobe, no nodules are appreciated. Seminal vesicles are normal. Skin: No rashes, bruises or suspicious lesions. Lymph: No  cervical or inguinal adenopathy. Neurologic: Grossly intact, no focal deficits, moving all 4 extremities. Psychiatric: Normal mood and affect.  Laboratory Data: PSA History  0.4 in 10/2014  0.6 in 09/2015   Lab Results  Component Value Date   WBC 7.0 01/01/2016   HGB 8.8 (L) 01/01/2016   HCT 25.3 (L) 01/01/2016   MCV 91.7 01/01/2016   PLT 234 01/01/2016    Lab Results  Component Value Date   CREATININE 0.92 01/03/2016    Lab Results  Component Value Date   HGBA1C 5.1 12/22/2015     Lab Results  Component Value Date   AST 20 12/31/2015   Lab Results  Component Value Date   ALT 21 12/31/2015    Urinalysis > 30 WBC's.  Many bacteria.  See EPIC.    Pertinent Imaging: Results for SOUA, CALTAGIRONE (MRN [id number]) as of 09/19/2016 10:32  Ref. Range 09/19/2016 10:02  Scan Result Unknown 170    Assessment & Plan:    1. UTI  - UA  Suspicious for infection we'll send for culture  - Start Augmentin empirically we'll adjust if necessary once culture and sensitivity results are available  2. Epididymitis  - resolved  3. History of hematuria  - No AMH on today's UA  - Patient does not report gross hematuria  - continue with yearly UA  4. BPH with incomplete emptying  - IPSS score is 16/5  - Continue conservative management, avoiding bladder irritants and timed voiding's  - most bothersome symptoms is/are frequency, urgency, dysuria, nocturia, and incontinence  - Continue tamsulosin 0.4 mg daily and CIC x 3; script given for depends  - RTC in one month for IPSS, PSA, PVR and exam   5. Abnormal prostate exam  - RTC in one month repeat exam and PSA  6. Family history of prostate cancer  - RTC in one month for repeat exam and PSA  7. Erectile dysfunction  - SHIM score is 5  - Patient not interested in therapy       Return in about 1 month (around 10/19/2016) for IPSS, PSA, PVR and exam.  These notes generated with voice recognition software. I apologize for typographical errors.  Zara Council, [facility] [address], [place], [place] [address] [phone]

## 2016-09-19 ENCOUNTER — Encounter: Payer: Self-pay | Admitting: Urology

## 2016-09-19 ENCOUNTER — Ambulatory Visit (INDEPENDENT_AMBULATORY_CARE_PROVIDER_SITE_OTHER): Payer: Medicare Other | Admitting: Urology

## 2016-09-19 VITALS — BP 143/84 | HR 85 | Ht 75.0 in | Wt 205.0 lb

## 2016-09-19 DIAGNOSIS — Z8042 Family history of malignant neoplasm of prostate: Secondary | ICD-10-CM | POA: Diagnosis not present

## 2016-09-19 DIAGNOSIS — R3989 Other symptoms and signs involving the genitourinary system: Secondary | ICD-10-CM

## 2016-09-19 DIAGNOSIS — N529 Male erectile dysfunction, unspecified: Secondary | ICD-10-CM | POA: Diagnosis not present

## 2016-09-19 DIAGNOSIS — N401 Enlarged prostate with lower urinary tract symptoms: Secondary | ICD-10-CM

## 2016-09-19 DIAGNOSIS — N3000 Acute cystitis without hematuria: Secondary | ICD-10-CM

## 2016-09-19 DIAGNOSIS — Z87448 Personal history of other diseases of urinary system: Secondary | ICD-10-CM | POA: Diagnosis not present

## 2016-09-19 DIAGNOSIS — N138 Other obstructive and reflux uropathy: Secondary | ICD-10-CM | POA: Diagnosis not present

## 2016-09-19 DIAGNOSIS — R339 Retention of urine, unspecified: Secondary | ICD-10-CM | POA: Diagnosis not present

## 2016-09-19 LAB — MICROSCOPIC EXAMINATION
RBC, UA: NONE SEEN /hpf (ref 0–?)
WBC, UA: >30 /hpf — ABNORMAL HIGH (ref 0–?)

## 2016-09-19 LAB — URINALYSIS, COMPLETE
Bilirubin, UA: NEGATIVE
GLUCOSE, UA: NEGATIVE
Nitrite, UA: NEGATIVE
SPEC GRAV UA: 1.02 (ref 1.005–1.030)
Urobilinogen, Ur: 1 mg/dL (ref 0.2–1.0)
pH, UA: 6 (ref 5.0–7.5)

## 2016-09-19 LAB — BLADDER SCAN AMB NON-IMAGING: Scan Result: 170

## 2016-09-19 MED ORDER — AMOXICILLIN-POT CLAVULANATE 875-125 MG PO TABS: 1.0000 | ORAL_TABLET | Freq: Two times a day (BID) | ORAL | 0 refills | Status: DC

## 2016-09-19 MED ORDER — DEPEND ADJUSTABLE UNDERWEAR LG MISC: 11 refills | Status: DC

## 2016-09-22 ENCOUNTER — Ambulatory Visit: Payer: Medicaid Other

## 2016-09-23 LAB — CULTURE, URINE COMPREHENSIVE

## 2016-09-26 ENCOUNTER — Telehealth: Payer: Self-pay

## 2016-09-26 NOTE — Telephone Encounter (Signed)
Called pt. No answer. No dpr on file.  

## 2016-09-26 NOTE — Telephone Encounter (Signed)
-----   Message from Nori Riis, PA-C sent at 09/25/2016  9:25 PM EDT ----- Please let Mr. Groot know that his urine culture is positive and the Augmentin is the appropriate antibiotic.  We will see him in one month.

## 2016-09-29 ENCOUNTER — Telehealth: Payer: Self-pay

## 2016-09-29 NOTE — Telephone Encounter (Signed)
Patient notified on vmail 

## 2016-09-29 NOTE — Telephone Encounter (Signed)
-----   Message from Nori Riis, PA-C sent at 09/25/2016  9:25 PM EDT ----- Please let Mr. Bade know that his urine culture is positive and the Augmentin is the appropriate antibiotic.  We will see him in one month.

## 2016-10-06 DIAGNOSIS — E539 Vitamin B deficiency, unspecified: Secondary | ICD-10-CM | POA: Diagnosis not present

## 2016-10-06 DIAGNOSIS — J309 Allergic rhinitis, unspecified: Secondary | ICD-10-CM | POA: Diagnosis not present

## 2016-10-06 DIAGNOSIS — G609 Hereditary and idiopathic neuropathy, unspecified: Secondary | ICD-10-CM | POA: Diagnosis not present

## 2016-10-06 DIAGNOSIS — I10 Essential (primary) hypertension: Secondary | ICD-10-CM | POA: Diagnosis not present

## 2016-10-06 DIAGNOSIS — Z1159 Encounter for screening for other viral diseases: Secondary | ICD-10-CM | POA: Diagnosis not present

## 2016-10-06 DIAGNOSIS — H259 Unspecified age-related cataract: Secondary | ICD-10-CM | POA: Diagnosis not present

## 2016-10-10 DIAGNOSIS — M5126 Other intervertebral disc displacement, lumbar region: Secondary | ICD-10-CM | POA: Diagnosis not present

## 2016-10-10 DIAGNOSIS — M48062 Spinal stenosis, lumbar region with neurogenic claudication: Secondary | ICD-10-CM | POA: Diagnosis not present

## 2016-10-10 DIAGNOSIS — M5416 Radiculopathy, lumbar region: Secondary | ICD-10-CM | POA: Diagnosis not present

## 2016-10-13 DIAGNOSIS — I10 Essential (primary) hypertension: Secondary | ICD-10-CM | POA: Diagnosis not present

## 2016-10-13 DIAGNOSIS — G4733 Obstructive sleep apnea (adult) (pediatric): Secondary | ICD-10-CM | POA: Diagnosis not present

## 2016-10-13 DIAGNOSIS — E782 Mixed hyperlipidemia: Secondary | ICD-10-CM | POA: Diagnosis not present

## 2016-10-13 DIAGNOSIS — I2510 Atherosclerotic heart disease of native coronary artery without angina pectoris: Secondary | ICD-10-CM | POA: Diagnosis not present

## 2016-10-18 ENCOUNTER — Emergency Department: Payer: Medicare Other

## 2016-10-18 ENCOUNTER — Observation Stay
Admission: EM | Admit: 2016-10-18 | Discharge: 2016-10-20 | Disposition: A | Discharge status: Home / Self Care | Payer: Medicare Other | Attending: Internal Medicine | Admitting: Internal Medicine

## 2016-10-18 ENCOUNTER — Other Ambulatory Visit: Payer: Medicare Other

## 2016-10-18 ENCOUNTER — Encounter: Payer: Self-pay | Admitting: Emergency Medicine

## 2016-10-18 DIAGNOSIS — I252 Old myocardial infarction: Secondary | ICD-10-CM | POA: Diagnosis not present

## 2016-10-18 DIAGNOSIS — I119 Hypertensive heart disease without heart failure: Secondary | ICD-10-CM | POA: Insufficient documentation

## 2016-10-18 DIAGNOSIS — F410 Panic disorder [episodic paroxysmal anxiety] without agoraphobia: Secondary | ICD-10-CM | POA: Insufficient documentation

## 2016-10-18 DIAGNOSIS — E119 Type 2 diabetes mellitus without complications: Secondary | ICD-10-CM | POA: Insufficient documentation

## 2016-10-18 DIAGNOSIS — E785 Hyperlipidemia, unspecified: Secondary | ICD-10-CM | POA: Diagnosis not present

## 2016-10-18 DIAGNOSIS — N390 Urinary tract infection, site not specified: Secondary | ICD-10-CM | POA: Diagnosis not present

## 2016-10-18 DIAGNOSIS — Z888 Allergy status to other drugs, medicaments and biological substances status: Secondary | ICD-10-CM | POA: Insufficient documentation

## 2016-10-18 DIAGNOSIS — E559 Vitamin D deficiency, unspecified: Secondary | ICD-10-CM | POA: Insufficient documentation

## 2016-10-18 DIAGNOSIS — M1990 Unspecified osteoarthritis, unspecified site: Secondary | ICD-10-CM | POA: Insufficient documentation

## 2016-10-18 DIAGNOSIS — R509 Fever, unspecified: Secondary | ICD-10-CM | POA: Diagnosis not present

## 2016-10-18 DIAGNOSIS — Z8249 Family history of ischemic heart disease and other diseases of the circulatory system: Secondary | ICD-10-CM | POA: Insufficient documentation

## 2016-10-18 DIAGNOSIS — R7989 Other specified abnormal findings of blood chemistry: Secondary | ICD-10-CM | POA: Insufficient documentation

## 2016-10-18 DIAGNOSIS — R531 Weakness: Secondary | ICD-10-CM | POA: Diagnosis not present

## 2016-10-18 DIAGNOSIS — F259 Schizoaffective disorder, unspecified: Secondary | ICD-10-CM | POA: Diagnosis not present

## 2016-10-18 DIAGNOSIS — R410 Disorientation, unspecified: Secondary | ICD-10-CM | POA: Diagnosis not present

## 2016-10-18 DIAGNOSIS — N400 Enlarged prostate without lower urinary tract symptoms: Secondary | ICD-10-CM

## 2016-10-18 DIAGNOSIS — Z7902 Long term (current) use of antithrombotics/antiplatelets: Secondary | ICD-10-CM | POA: Insufficient documentation

## 2016-10-18 DIAGNOSIS — R778 Other specified abnormalities of plasma proteins: Secondary | ICD-10-CM

## 2016-10-18 DIAGNOSIS — G92 Toxic encephalopathy: Principal | ICD-10-CM | POA: Insufficient documentation

## 2016-10-18 DIAGNOSIS — B9620 Unspecified Escherichia coli [E. coli] as the cause of diseases classified elsewhere: Secondary | ICD-10-CM | POA: Insufficient documentation

## 2016-10-18 DIAGNOSIS — Z955 Presence of coronary angioplasty implant and graft: Secondary | ICD-10-CM | POA: Insufficient documentation

## 2016-10-18 DIAGNOSIS — Z8659 Personal history of other mental and behavioral disorders: Secondary | ICD-10-CM | POA: Diagnosis not present

## 2016-10-18 DIAGNOSIS — Z885 Allergy status to narcotic agent status: Secondary | ICD-10-CM | POA: Diagnosis not present

## 2016-10-18 DIAGNOSIS — F429 Obsessive-compulsive disorder, unspecified: Secondary | ICD-10-CM | POA: Diagnosis not present

## 2016-10-18 DIAGNOSIS — I2510 Atherosclerotic heart disease of native coronary artery without angina pectoris: Secondary | ICD-10-CM | POA: Diagnosis not present

## 2016-10-18 DIAGNOSIS — G20 Parkinson's disease: Secondary | ICD-10-CM | POA: Insufficient documentation

## 2016-10-18 DIAGNOSIS — Z7984 Long term (current) use of oral hypoglycemic drugs: Secondary | ICD-10-CM | POA: Insufficient documentation

## 2016-10-18 DIAGNOSIS — R4182 Altered mental status, unspecified: Secondary | ICD-10-CM | POA: Diagnosis not present

## 2016-10-18 DIAGNOSIS — F05 Delirium due to known physiological condition: Secondary | ICD-10-CM | POA: Diagnosis not present

## 2016-10-18 DIAGNOSIS — F319 Bipolar disorder, unspecified: Secondary | ICD-10-CM | POA: Insufficient documentation

## 2016-10-18 DIAGNOSIS — Z79899 Other long term (current) drug therapy: Secondary | ICD-10-CM | POA: Insufficient documentation

## 2016-10-18 DIAGNOSIS — K219 Gastro-esophageal reflux disease without esophagitis: Secondary | ICD-10-CM | POA: Diagnosis not present

## 2016-10-18 DIAGNOSIS — Z8673 Personal history of transient ischemic attack (TIA), and cerebral infarction without residual deficits: Secondary | ICD-10-CM | POA: Diagnosis not present

## 2016-10-18 DIAGNOSIS — G3189 Other specified degenerative diseases of nervous system: Secondary | ICD-10-CM | POA: Insufficient documentation

## 2016-10-18 DIAGNOSIS — Z87440 Personal history of urinary (tract) infections: Secondary | ICD-10-CM | POA: Insufficient documentation

## 2016-10-18 DIAGNOSIS — E871 Hypo-osmolality and hyponatremia: Secondary | ICD-10-CM | POA: Diagnosis not present

## 2016-10-18 DIAGNOSIS — Z87891 Personal history of nicotine dependence: Secondary | ICD-10-CM | POA: Insufficient documentation

## 2016-10-18 LAB — CBC WITH DIFFERENTIAL/PLATELET
BASOS ABS: 0 10*3/uL (ref 0–0.1)
Basophils Relative: 0 %
EOS ABS: 0 10*3/uL (ref 0–0.7)
EOS PCT: 0 %
HCT: 39.3 % — ABNORMAL LOW (ref 40.0–52.0)
Hemoglobin: 13.3 g/dL (ref 13.0–18.0)
Lymphocytes Relative: 6 %
Lymphs Abs: 0.6 10*3/uL — ABNORMAL LOW (ref 1.0–3.6)
MCH: 30.7 pg (ref 26.0–34.0)
MCHC: 33.9 g/dL (ref 32.0–36.0)
MCV: 90.6 fL (ref 80.0–100.0)
Monocytes Absolute: 0.7 10*3/uL (ref 0.2–1.0)
Monocytes Relative: 7 %
Neutro Abs: 8.6 10*3/uL — ABNORMAL HIGH (ref 1.4–6.5)
Neutrophils Relative %: 87 %
PLATELETS: 158 10*3/uL (ref 150–440)
RBC: 4.34 MIL/uL — ABNORMAL LOW (ref 4.40–5.90)
RDW: 13.5 % (ref 11.5–14.5)
WBC: 10 10*3/uL (ref 3.8–10.6)

## 2016-10-18 LAB — COMPREHENSIVE METABOLIC PANEL
ALK PHOS: 55 U/L (ref 38–126)
ALT: 13 U/L — AB (ref 17–63)
AST: 16 U/L (ref 15–41)
Albumin: 4.2 g/dL (ref 3.5–5.0)
Anion gap: 8 (ref 5–15)
BUN: 20 mg/dL (ref 6–20)
CALCIUM: 9.1 mg/dL (ref 8.9–10.3)
CO2: 22 mmol/L (ref 22–32)
CREATININE: 0.85 mg/dL (ref 0.61–1.24)
Chloride: 104 mmol/L (ref 101–111)
Glucose, Bld: 112 mg/dL — ABNORMAL HIGH (ref 65–99)
Potassium: 3.6 mmol/L (ref 3.5–5.1)
Sodium: 134 mmol/L — ABNORMAL LOW (ref 135–145)
Total Bilirubin: 0.7 mg/dL (ref 0.3–1.2)
Total Protein: 7.1 g/dL (ref 6.5–8.1)

## 2016-10-18 LAB — URINALYSIS, COMPLETE (UACMP) WITH MICROSCOPIC
BILIRUBIN URINE: NEGATIVE
Bacteria, UA: NONE SEEN
Glucose, UA: NEGATIVE mg/dL
Hgb urine dipstick: NEGATIVE
Ketones, ur: NEGATIVE mg/dL
NITRITE: NEGATIVE
PH: 7 (ref 5.0–8.0)
Protein, ur: 100 mg/dL — AB
SPECIFIC GRAVITY, URINE: 1.013 (ref 1.005–1.030)

## 2016-10-18 LAB — TROPONIN I: TROPONIN I: 0.08 ng/mL — AB (ref <0.03)

## 2016-10-18 LAB — LACTIC ACID, PLASMA: LACTIC ACID, VENOUS: 0.8 mmol/L (ref 0.5–1.9)

## 2016-10-18 MED ORDER — GABAPENTIN 300 MG PO CAPS: 300.0000 mg | ORAL_CAPSULE | ORAL | Status: DC

## 2016-10-18 MED ORDER — CARBAMAZEPINE ER 300 MG PO CP12: 300.0000 mg | ORAL_CAPSULE | Freq: Two times a day (BID) | ORAL | Status: DC

## 2016-10-18 MED ORDER — ALBUTEROL SULFATE (2.5 MG/3ML) 0.083% IN NEBU: 2.5000 mg | INHALATION_SOLUTION | Freq: Four times a day (QID) | RESPIRATORY_TRACT | Status: DC | PRN

## 2016-10-18 MED ORDER — PANTOPRAZOLE SODIUM 40 MG PO TBEC
40.0000 mg | DELAYED_RELEASE_TABLET | Freq: Every day | ORAL | Status: DC
  Administered 2016-10-19 – 2016-10-20 (×2): 40 mg via ORAL
  Filled 2016-10-18 (×2): qty 1

## 2016-10-18 MED ORDER — DOXEPIN HCL 25 MG PO CAPS
25.0000 mg | ORAL_CAPSULE | Freq: Every day | ORAL | Status: DC
  Administered 2016-10-19 – 2016-10-20 (×2): 25 mg via ORAL
  Filled 2016-10-18 (×2): qty 1

## 2016-10-18 MED ORDER — LORAZEPAM 1 MG PO TABS
1.0000 mg | ORAL_TABLET | Freq: Every morning | ORAL | Status: DC
  Administered 2016-10-19 – 2016-10-20 (×2): 1 mg via ORAL
  Filled 2016-10-18 (×3): qty 1

## 2016-10-18 MED ORDER — ATORVASTATIN CALCIUM 20 MG PO TABS
40.0000 mg | ORAL_TABLET | Freq: Every day | ORAL | Status: DC
  Administered 2016-10-19: 40 mg via ORAL
  Filled 2016-10-18: qty 2

## 2016-10-18 MED ORDER — ACETAMINOPHEN 500 MG PO TABS: 1000.0000 mg | ORAL_TABLET | Freq: Four times a day (QID) | ORAL | Status: DC | PRN

## 2016-10-18 MED ORDER — BISACODYL 5 MG PO TBEC: 5.0000 mg | DELAYED_RELEASE_TABLET | Freq: Every day | ORAL | Status: DC | PRN

## 2016-10-18 MED ORDER — SODIUM CHLORIDE 0.9% FLUSH
3.0000 mL | Freq: Two times a day (BID) | INTRAVENOUS | Status: DC
  Administered 2016-10-19 (×2): 3 mL via INTRAVENOUS

## 2016-10-18 MED ORDER — SENNOSIDES-DOCUSATE SODIUM 8.6-50 MG PO TABS
1.0000 | ORAL_TABLET | Freq: Every evening | ORAL | Status: DC | PRN
  Administered 2016-10-19: 1 via ORAL
  Filled 2016-10-18: qty 1

## 2016-10-18 MED ORDER — LISINOPRIL 20 MG PO TABS
20.0000 mg | ORAL_TABLET | Freq: Every day | ORAL | Status: DC
  Administered 2016-10-19 – 2016-10-20 (×2): 20 mg via ORAL
  Filled 2016-10-18 (×2): qty 1

## 2016-10-18 MED ORDER — SODIUM CHLORIDE 0.9 % IV SOLN
INTRAVENOUS | Status: DC
  Administered 2016-10-18: via INTRAVENOUS

## 2016-10-18 MED ORDER — ENOXAPARIN SODIUM 40 MG/0.4ML ~~LOC~~ SOLN
40.0000 mg | SUBCUTANEOUS | Status: DC
  Administered 2016-10-19: 40 mg via SUBCUTANEOUS
  Filled 2016-10-18: qty 0.4

## 2016-10-18 MED ORDER — INSULIN ASPART 100 UNIT/ML ~~LOC~~ SOLN: 0.0000 [IU] | Freq: Every day | SUBCUTANEOUS | Status: DC

## 2016-10-18 MED ORDER — DEXTROSE 5 % IV SOLN
1.0000 g | INTRAVENOUS | Status: DC
  Administered 2016-10-19: 1 g via INTRAVENOUS
  Filled 2016-10-18 (×2): qty 10

## 2016-10-18 MED ORDER — AMLODIPINE BESYLATE 5 MG PO TABS
5.0000 mg | ORAL_TABLET | Freq: Every day | ORAL | Status: DC
  Administered 2016-10-19 – 2016-10-20 (×2): 5 mg via ORAL
  Filled 2016-10-18 (×2): qty 1

## 2016-10-18 MED ORDER — INSULIN ASPART 100 UNIT/ML ~~LOC~~ SOLN
0.0000 [IU] | Freq: Three times a day (TID) | SUBCUTANEOUS | Status: DC
  Administered 2016-10-19: 3 [IU] via SUBCUTANEOUS
  Filled 2016-10-18: qty 1

## 2016-10-18 MED ORDER — FLUTICASONE PROPIONATE 50 MCG/ACT NA SUSP
1.0000 | Freq: Every day | NASAL | Status: DC
  Administered 2016-10-19 – 2016-10-20 (×2): 1 via NASAL
  Filled 2016-10-18: qty 16

## 2016-10-18 MED ORDER — ONDANSETRON HCL 4 MG/2ML IJ SOLN: 4.0000 mg | Freq: Four times a day (QID) | INTRAMUSCULAR | Status: DC | PRN

## 2016-10-18 MED ORDER — CLOPIDOGREL BISULFATE 75 MG PO TABS
75.0000 mg | ORAL_TABLET | Freq: Every day | ORAL | Status: DC
  Administered 2016-10-19 – 2016-10-20 (×2): 75 mg via ORAL
  Filled 2016-10-18 (×2): qty 1

## 2016-10-18 MED ORDER — TRAMADOL HCL 50 MG PO TABS: 50.0000 mg | ORAL_TABLET | ORAL | Status: DC

## 2016-10-18 MED ORDER — CARVEDILOL 3.125 MG PO TABS
3.1250 mg | ORAL_TABLET | Freq: Two times a day (BID) | ORAL | Status: DC
  Administered 2016-10-19 – 2016-10-20 (×3): 3.125 mg via ORAL
  Filled 2016-10-18 (×3): qty 1

## 2016-10-18 MED ORDER — FLUTICASONE FUROATE-VILANTEROL 200-25 MCG/INH IN AEPB
1.0000 | INHALATION_SPRAY | Freq: Every day | RESPIRATORY_TRACT | Status: DC
  Administered 2016-10-19 – 2016-10-20 (×2): 1 via RESPIRATORY_TRACT
  Filled 2016-10-18: qty 28

## 2016-10-18 MED ORDER — ONDANSETRON HCL 4 MG PO TABS: 4.0000 mg | ORAL_TABLET | Freq: Four times a day (QID) | ORAL | Status: DC | PRN

## 2016-10-18 MED ORDER — LORAZEPAM 2 MG PO TABS
2.0000 mg | ORAL_TABLET | Freq: Every day | ORAL | Status: DC
  Administered 2016-10-19 (×2): 2 mg via ORAL
  Filled 2016-10-18 (×2): qty 1

## 2016-10-18 MED ORDER — TOPIRAMATE 100 MG PO TABS
100.0000 mg | ORAL_TABLET | Freq: Three times a day (TID) | ORAL | Status: DC
  Administered 2016-10-19 – 2016-10-20 (×5): 100 mg via ORAL
  Filled 2016-10-18 (×7): qty 1

## 2016-10-18 MED ORDER — BUPROPION HCL ER (XL) 150 MG PO TB24
300.0000 mg | ORAL_TABLET | Freq: Every day | ORAL | Status: DC
  Administered 2016-10-19 – 2016-10-20 (×2): 300 mg via ORAL
  Filled 2016-10-18 (×2): qty 2

## 2016-10-18 MED ORDER — LORAZEPAM 1 MG PO TABS: 1.0000 mg | ORAL_TABLET | Freq: Two times a day (BID) | ORAL | Status: DC

## 2016-10-18 MED ORDER — MAGNESIUM CITRATE PO SOLN
1.0000 | Freq: Once | ORAL | Status: DC | PRN
  Filled 2016-10-18: qty 296

## 2016-10-18 MED ORDER — TAMSULOSIN HCL 0.4 MG PO CAPS
0.4000 mg | ORAL_CAPSULE | Freq: Every day | ORAL | Status: DC
  Administered 2016-10-19 – 2016-10-20 (×2): 0.4 mg via ORAL
  Filled 2016-10-18 (×2): qty 1

## 2016-10-18 MED ORDER — RISPERIDONE 1 MG PO TABS
4.0000 mg | ORAL_TABLET | Freq: Every day | ORAL | Status: DC
  Administered 2016-10-19 – 2016-10-20 (×2): 4 mg via ORAL
  Filled 2016-10-18 (×2): qty 4

## 2016-10-18 MED ORDER — IPRATROPIUM BROMIDE 0.02 % IN SOLN: 0.5000 mg | Freq: Four times a day (QID) | RESPIRATORY_TRACT | Status: DC | PRN

## 2016-10-18 MED ORDER — DEXTROSE 5 % IV SOLN
1.0000 g | Freq: Once | INTRAVENOUS | Status: AC
  Administered 2016-10-18: 1 g via INTRAVENOUS
  Filled 2016-10-18: qty 10

## 2016-10-18 NOTE — ED Provider Notes (Signed)
Clinical Course as of Oct 19 28  Tue Oct 18, 2016  1912 Assuming care from Dr. Reita Cliche.  In short, Roger Pruitt is a 66 y.o. male with a chief complaint of AMS.  Refer to the original H&P for additional details.  The current plan of care is to admit after his labs and testing comes back; he is febrile and altered and is getting empiric antibiotics..   [CF]  2110 The patient's labs are generally reassuring except for an elevated troponin at 0.08, likely demand ischemia given no chest pain or shortness of breath, and a positive UTI.  I went back into the room to update the patient and his wife.  The room does smell strongly of UTI.  I explained that with his delirium in the setting of his urinary tract infection it would be understandable and appropriate to bring him in for IV antibiotics and the wife strongly requests this; he developed sepsis in the past secondary to urinary tract infection and she is afraid that is what is happening this time as well.  I will discuss with the hospitalist and admit.  [CF]  2111 Sent Dr. Ara Kussmaul a text to inform her of the admission.  [CF]    Clinical Course User Index [CF] Hinda Kehr, MD   Labs Reviewed  COMPREHENSIVE METABOLIC PANEL - Abnormal; Notable for the following:       Result Value   Sodium 134 (*)    Glucose, Bld 112 (*)    ALT 13 (*)    All other components within normal limits  TROPONIN I - Abnormal; Notable for the following:    Troponin I 0.08 (*)    All other components within normal limits  CBC WITH DIFFERENTIAL/PLATELET - Abnormal; Notable for the following:    RBC 4.34 (*)    HCT 39.3 (*)    Neutro Abs 8.6 (*)    Lymphs Abs 0.6 (*)    All other components within normal limits  URINALYSIS, COMPLETE (UACMP) WITH MICROSCOPIC - Abnormal; Notable for the following:    Color, Urine YELLOW (*)    APPearance CLOUDY (*)    Protein, ur 100 (*)    Leukocytes, UA LARGE (*)    Squamous Epithelial / LPF 0-5 (*)    All other components within  normal limits  CULTURE, BLOOD (ROUTINE X 2)  CULTURE, BLOOD (ROUTINE X 2)  URINE CULTURE  LACTIC ACID, PLASMA  LACTIC ACID, PLASMA  HIV ANTIBODY (ROUTINE TESTING)  MAGNESIUM  PHOSPHORUS  BASIC METABOLIC PANEL  CBC  TROPONIN I  TROPONIN I  TROPONIN I    Ct Head Wo Contrast  Result Date: 10/18/2016 CLINICAL DATA:  Acute confusion, history of bipolar disorder EXAM: CT HEAD WITHOUT CONTRAST TECHNIQUE: Contiguous axial images were obtained from the base of the skull through the vertex without intravenous contrast. COMPARISON:  07/29/2015 FINDINGS: Brain: Mild atrophic changes are noted. No findings to suggest acute hemorrhage, acute infarction or space-occupying mass lesion are seen. A few small lacunar infarcts are noted and stable from the prior exam. One in the right basal ganglia and a second within the inferior aspect of the left basal ganglia. Vascular: No hyperdense vessel or unexpected calcification. Skull: Normal. Negative for fracture or focal lesion. Sinuses/Orbits: No acute finding. Other: None IMPRESSION: Atrophic changes and stable lacunar infarcts. No acute abnormality noted. Electronically Signed   By: Inez Catalina M.D.   On: 10/18/2016 18:51   Dg Chest Port 1 View  Result Date: 10/18/2016  CLINICAL DATA:  Confusion and history of bipolar disorder EXAM: PORTABLE CHEST 1 VIEW COMPARISON:  02/26/2016 FINDINGS: Cardiac shadow is within normal limits. Postsurgical changes are noted. The lungs are clear bilaterally. Previously seen nodular densities are not well appreciated on this exam. IMPRESSION: No acute abnormality noted. Electronically Signed   By: Inez Catalina M.D.   On: 10/18/2016 18:54      Hinda Kehr, MD 10/19/16 [phone]

## 2016-10-18 NOTE — ED Triage Notes (Signed)
Pt via ems from home; ems called out 3 hours ago for stroke-like symptoms. Wife stated husband was altered. Pt was oriented when ems arrived and refused transport. Wife called again for confusion. EMS states pt has "intermittent confusion." Strong odor of urine present during triage. Pt, however, alert & oriented.

## 2016-10-18 NOTE — ED Notes (Addendum)
Pt's wife states that she has noticed blood in pt's urine and spoke with urology office, who told her that he could wait until appointment on August 1 with Dr. Erlene Quan.   Pt has prior sepsis hx with UTI in 2016.
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
## 2016-10-18 NOTE — ED Notes (Signed)
Dr. Hugelmeyer at bedside. 

## 2016-10-18 NOTE — H&P (Signed)
History and Physical   [facility] [place] [facility] Admission History and Physical [employer], D.O.    Patient Name: Roger Pruitt MR#: [id number] Date of Birth: [birth year] Date of Admission: 10/18/2016  Referring MD/NP/PA: Dr. Karma Greaser Primary Care Physician: Casilda Carls, MD  Chief Complaint:  Chief Complaint  Patient presents with  . Altered Mental Status    HPI: Roger Pruitt is a 66 y.o. male with a known history of Bipolar, schizoaffective disorder, coronary artery disease, GERD, hypertension, hyperlipidemia, Parkinson's disease, diabetes presents to the emergency department for evaluation of altered mental status.  Patient was in a usual state of health until This morning EMS was called because of altered mental status, confusion and disorientation..  Patient denies fevers/chills, weakness, dizziness, chest pain, shortness of breath, N/V/C/D, abdominal pain, dysuria/frequency, changes in mental status.    Otherwise there has been no change in status. Patient has been taking medication as prescribed and there has been no recent change in medication or diet.  No recent antibiotics.  There has been no recent illness, hospitalizations, travel or sick contacts.    EMS/ED Course: Patient received Rocephin. Medical admission has been requested for further management of altered mental status secondary to urinary tract infection.  Review of Systems:  CONSTITUTIONAL: No fever/chills, fatigue, weakness, weight gain/loss, headache. EYES: No blurry or double vision. ENT: No tinnitus, postnasal drip, redness or soreness of the oropharynx. RESPIRATORY: No cough, dyspnea, wheeze.  No hemoptysis.  CARDIOVASCULAR: No chest pain, palpitations, syncope, orthopnea. No lower extremity edema.  GASTROINTESTINAL: No nausea, vomiting, abdominal pain, diarrhea, constipation.  No hematemesis, melena or hematochezia. GENITOURINARY: No dysuria, frequency, hematuria. Positive malodor and urinary  incontinence ENDOCRINE: No polyuria or nocturia. No heat or cold intolerance. HEMATOLOGY: No anemia, bruising, bleeding. INTEGUMENTARY: No rashes, ulcers, lesions. MUSCULOSKELETAL: No arthritis, gout, dyspnea. NEUROLOGIC: Positive altered mental status No numbness, tingling, ataxia, seizure-type activity, weakness. PSYCHIATRIC: No anxiety, depression, insomnia.   Past Medical History:  Diagnosis Date  . Abdominal pain   . Acute cystitis without hematuria   . Anemia   . Anginal pain ([place])   . Arthritis   . Bipolar disorder ([place])    BIPOLAR/ SCHIZO/ AFFECTIVE   . BPD (bronchopulmonary dysplasia)   . BPH (benign prostatic hyperplasia)   . CAD (coronary artery disease)    a. s/p MI 2001, 2012;  b. s/p prior LCX stenting; b. 05/2012 Abnl MV; c. 05/2012 Cath: patent LCX stent w/ otw mod non-obs dzs->Med Rx.  . Carpal tunnel syndrome   . Disc degeneration, lumbar   . Elevated PSA   . Esophageal reflux   . GERD (gastroesophageal reflux disease)   . Hematuria    history of gross and microscopic  . Hemorrhoid   . History of balanitis   . History of bladder infections   . History of echocardiogram    a. 07/2014 Echo: EF 60-65%, no rwma, nl LA size, nl RV/PASP.  Marland Kitchen History of urethral stricture   . Hyperlipidemia   . Hypertension   . Hypertensive heart disease   . Incomplete bladder emptying   . Iron deficiency anemia, unspecified   . Lower urinary tract symptoms   . Myocardial infarction ([place])    X7  . Neuropathy   . Nocturia   . Obsessive compulsive disorder   . Other dysphagia   . Panic attack   . Parkinson's disease ([place])   . Sleep apnea    SURGICAL TX  . Stroke ([place])   . Type  II diabetes mellitus ([place])   . Unspecified vitamin D deficiency   . Urethral stricture   . Urinary retention   . Varicose vein of leg   . Vitamin D deficiency     Past Surgical History:  Procedure Laterality Date  . BACK SURGERY     lumbar disc  . BACK SURGERY    . CARDIAC CATHETERIZATION   6/10;2011;Aug.2012;Oct. 2012;   . CARDIAC CATHETERIZATION  05/2012   [facility]; EF 60%, patent stent in the left circumflex, moderate three-vessel disease with no flow-limiting lesions. Unchanged from most recent catheterization  . CARDIAC CATHETERIZATION N/A 12/10/2015   Procedure: Left Heart Cath and Coronary Angiography;  Surgeon: Corey Skains, MD;  Location: [facility];  Service: Cardiovascular;  Laterality: N/A;  . CARPAL TUNNEL RELEASE  2009   left  . CORONARY ANGIOPLASTY  2011 & 2001   s/p stent  . CORONARY ARTERY BYPASS GRAFT     stent placement  . CORONARY ARTERY BYPASS GRAFT N/A 12/25/2015   Procedure: CORONARY ARTERY BYPASS GRAFTING (CABG) x4  , using left internal mammary artery and right leg greater saphenous vein harvested endoscopically LIMA-LAD SVG-OM2 SVG-DIAG SVG-PD;  Surgeon: Melrose Nakayama, MD;  Location: [facility];  Service: Open Heart Surgery;  Laterality: N/A;  . [place]  . CYST REMOVAL LEG Right   . SKIN GRAFT    . TEE WITHOUT CARDIOVERSION N/A 12/25/2015   Procedure: TRANSESOPHAGEAL ECHOCARDIOGRAM (TEE);  Surgeon: Melrose Nakayama, MD;  Location: [facility];  Service: Open Heart Surgery;  Laterality: N/A;  . TONSILLECTOMY       reports that he quit smoking about 17 years ago. His smoking use included Cigarettes. He has a 7.50 pack-year smoking history. He has never used smokeless tobacco. He reports that he does not drink alcohol or use drugs.  Allergies  Allergen Reactions  . Morphine And Related Nausea And Vomiting    Other reaction(s): Nausea And Vomiting  . Synalgos-Dc [Aspirin-Caff-Dihydrocodeine] Itching and Other (See Comments)    Reaction:  Stinging   . Synchlor A [Chlorpheniramine] Rash    Patient doesn't recall this allergy.    Family History  Problem Relation Age of Onset  . Heart disease Mother   . Heart disease Brother   . Prostate cancer Father   . Heart disease Unknown   . Heart disease Brother   . Kidney disease Neg  Hx   . Kidney cancer Neg Hx   . Bladder Cancer Neg Hx     Prior to Admission medications   Medication Sig Start Date End Date Taking? Authorizing Provider  acetaminophen (TYLENOL) 500 MG tablet Take 1,000 mg by mouth every 6 (six) hours as needed (pain).    [provider]  amLODipine (NORVASC) 5 MG tablet Take 5 mg by mouth daily. 08/24/16   [provider]  amoxicillin-clavulanate (AUGMENTIN) 875-125 MG tablet Take 1 tablet by mouth every 12 (twelve) hours. 09/19/16   Zara Council A, PA-C  atorvastatin (LIPITOR) 40 MG tablet Take 1 tablet (40 mg total) by mouth daily at 6 PM. 12/30/15   Barrett, Erin R, PA-C  budesonide-formoterol (SYMBICORT) 160-4.5 MCG/ACT inhaler Inhale 2 puffs into the lungs 2 (two) times daily. 09/16/16   Flora Lipps, MD  buPROPion (WELLBUTRIN XL) 300 MG 24 hr tablet Take 300 mg by mouth daily. Takes at noon.    [provider]  Carbamazepine (EQUETRO) 300 MG CP12 Take 300 mg by mouth 2 (two) times daily.  [provider]  carvedilol (COREG) 3.125 MG tablet  03/25/16   [provider]  clopidogrel (PLAVIX) 75 MG tablet Take 75 mg by mouth daily. 11/25/15   [provider]  doxepin (SINEQUAN) 25 MG capsule Take 25 mg by mouth daily. 08/02/16   [provider]  fluticasone (FLONASE) 50 MCG/ACT nasal spray Place 1 spray into both nostrils daily.    [provider]  gabapentin (NEURONTIN) 300 MG capsule Take 300 mg by mouth as directed. 08/02/16   [provider]  Incontinence Supply Disposable (DEPEND ADJUSTABLE UNDERWEAR LG) MISC Wear pads tid 09/19/16   McGowan, Larene [place] A, PA-C  JANUMET 50-1000 MG tablet Take 1 tablet by mouth 2 (two) times daily. 08/02/16   [provider]  lisinopril (PRINIVIL,ZESTRIL) 20 MG tablet Take 20 mg by mouth daily. 08/02/16   [provider]  LORazepam (ATIVAN) 1 MG tablet Take 1-2 tablets (1-2 mg total) by mouth 2 (two) times daily. Pt takes one  tablet in the morning and two at bedtime. 01/04/16   Gladstone Lighter, MD  pantoprazole (PROTONIX) 40 MG tablet Take 40 mg by mouth daily. Takes at noon.    [provider]  polyethylene glycol-electrolytes (TRILYTE) 420 g solution Take 4,000 mLs by mouth as directed. Drink one 8 oz glass every 20 mins until stools are clear Patient not taking: Reported on 09/19/2016 03/29/16   [name], MD  risperidone (RISPERDAL) 4 MG tablet Take 4 mg by mouth daily. 08/02/16   [provider]  tamsulosin (FLOMAX) 0.4 MG CAPS capsule Take 0.4 mg by mouth.    [provider]  topiramate (TOPAMAX) 100 MG tablet Take 100 mg by mouth 3 (three) times daily. Takes at noon.    [provider]  traMADol (ULTRAM) 50 MG tablet Take 50 mg by mouth as directed. 08/08/16   [provider]    Physical Exam: Vitals:   10/18/16 1802 10/18/16 1942 10/18/16 1956 10/18/16 2030  BP:   (!) 189/99 (!) 161/93  Pulse:   98 93  Resp:   (!) 24 (!) 23  Temp:  99.1 F (37.3 C)    TempSrc:  Oral    SpO2:   100% 95%  Weight: 119.3 kg (263 lb)     Height: 6\' 3"  (1.905 m)       GENERAL: 66 y.o.-year-old Male patient, well-developed, well-nourished lying in the bed in no acute distress.  Pleasant and cooperative.   HEENT: Head atraumatic, normocephalic. Pupils equal. Mucus membranes Dry. NECK: Supple, full range of motion. No JVD, no bruit heard. No thyroid enlargement, no tenderness, no cervical lymphadenopathy. CHEST: Normal breath sounds bilaterally. No wheezing, rales, rhonchi or crackles. No use of accessory muscles of respiration.  No reproducible chest wall tenderness.  CARDIOVASCULAR: S1, S2 normal. No murmurs, rubs, or gallops. Cap refill <2 seconds. Pulses intact distally.  ABDOMEN: Soft, nondistended, nontender. No rebound, guarding, rigidity. Normoactive bowel sounds present in all four quadrants.  EXTREMITIES: Contracture right upper extremity and hand No pedal edema, cyanosis,  or clubbing. No calf tenderness or Homan's sign.  NEUROLOGIC: The patient is alert and oriented x 3. Cranial nerves II through XII are grossly intact with no focal sensorimotor deficit. PSYCHIATRIC:  Normal affect, mood, thought content. SKIN: Warm, dry, and intact without obvious rash, lesion, or ulcer.    Labs on Admission:  CBC:  Recent Labs Lab 10/18/16 1810  WBC 10.0  NEUTROABS 8.6*  HGB 13.3  HCT 39.3*  MCV 90.6  PLT 008   Basic Metabolic Panel:  Recent Labs Lab 10/18/16 1810  NA 134*  K 3.6  CL 104  CO2 22  GLUCOSE 112*  BUN 20  CREATININE 0.85  CALCIUM 9.1   GFR: Estimated Creatinine Clearance: 120.6 mL/min (by C-G formula based on SCr of 0.85 mg/dL). Liver Function Tests:  Recent Labs Lab 10/18/16 1810  AST 16  ALT 13*  ALKPHOS 55  BILITOT 0.7  PROT 7.1  ALBUMIN 4.2   No results for input(s): LIPASE, AMYLASE in the last 168 hours. No results for input(s): AMMONIA in the last 168 hours. Coagulation Profile: No results for input(s): INR, PROTIME in the last 168 hours. Cardiac Enzymes:  Recent Labs Lab 10/18/16 1810  TROPONINI 0.08*   BNP (last 3 results) No results for input(s): PROBNP in the last 8760 hours. HbA1C: No results for input(s): HGBA1C in the last 72 hours. CBG: No results for input(s): GLUCAP in the last 168 hours. Lipid Profile: No results for input(s): CHOL, HDL, LDLCALC, TRIG, CHOLHDL, LDLDIRECT in the last 72 hours. Thyroid Function Tests: No results for input(s): TSH, T4TOTAL, FREET4, T3FREE, THYROIDAB in the last 72 hours. Anemia Panel: No results for input(s): VITAMINB12, FOLATE, FERRITIN, TIBC, IRON, RETICCTPCT in the last 72 hours. Urine analysis:    Component Value Date/Time   COLORURINE YELLOW (A) 10/18/2016 1940   APPEARANCEUR CLOUDY (A) 10/18/2016 1940   APPEARANCEUR Cloudy (A) 09/19/2016 0955   LABSPEC 1.013 10/18/2016 1940   LABSPEC 1.006 02/11/2014 [place] 7.0 10/18/2016 1940   GLUCOSEU NEGATIVE  10/18/2016 1940   GLUCOSEU Negative 02/11/2014 [place] NEGATIVE 10/18/2016 1940   BILIRUBINUR NEGATIVE 10/18/2016 1940   BILIRUBINUR Negative 09/19/2016 0955   BILIRUBINUR Negative 02/11/2014 1716   [place] 10/18/2016 1940   PROTEINUR 100 (A) 10/18/2016 1940   NITRITE NEGATIVE 10/18/2016 1940   LEUKOCYTESUR LARGE (A) 10/18/2016 1940   LEUKOCYTESUR 3+ (A) 09/19/2016 0955   LEUKOCYTESUR Negative 02/11/2014 1716   Sepsis Labs: @LABRCNTIP (procalcitonin:4,lacticidven:4) )No results found for this or any previous visit (from the past 240 hour(s)).   Radiological Exams on Admission: Ct Head Wo Contrast  Result Date: 10/18/2016 CLINICAL DATA:  Acute confusion, history of bipolar disorder EXAM: CT HEAD WITHOUT CONTRAST TECHNIQUE: Contiguous axial images were obtained from the base of the skull through the vertex without intravenous contrast. COMPARISON:  07/29/2015 FINDINGS: Brain: Mild atrophic changes are noted. No findings to suggest acute hemorrhage, acute infarction or space-occupying mass lesion are seen. A few small lacunar infarcts are noted and stable from the prior exam. One in the right basal ganglia and a second within the inferior aspect of the left basal ganglia. Vascular: No hyperdense vessel or unexpected calcification. Skull: Normal. Negative for fracture or focal lesion. Sinuses/Orbits: No acute finding. Other: None IMPRESSION: Atrophic changes and stable lacunar infarcts. No acute abnormality noted. Electronically Signed   By: Inez Catalina M.D.   On: 10/18/2016 18:51   Dg Chest Port 1 View  Result Date: 10/18/2016 CLINICAL DATA:  Confusion and history of bipolar disorder EXAM: PORTABLE CHEST 1 VIEW COMPARISON:  02/26/2016 FINDINGS: Cardiac shadow is within normal limits. Postsurgical changes are noted. The lungs are clear bilaterally. Previously seen nodular densities are not well appreciated on this exam. IMPRESSION: No acute abnormality noted. Electronically Signed    By: Inez Catalina M.D.   On: 10/18/2016 18:54    EKG: Normal sinus rhythm at 94 bpm with leftward axis and nonspecific ST-T wave changes.  Assessment/Plan  This is a 66 y.o. male with a history of Bipolar, schizoaffective disorder, coronary artery disease, GERD, hypertension, hyperlipidemia, Parkinson's disease, diabetes  now being admitted with:  #. AMS 2/2 UTI - Admit to inpatient with telemetry monitoring - IV antibiotics: Rocephin - IV fluid hydration - Follow up blood,urine cultures - Repeat CBC in am.   #. Elevated troponin, may be demand - Trend troponins, check lipids and TSH. - Monitor on telemetry   #. History of hypertension -Continue Norvasc, lisinopril, Coreg  #. History of BPH - Continue Flomax  #. History of hyperlipidemia - Continue Lipitor  #. History of CAD - Continue Plavix  #. History of bipolar disorder - Continue Wellbutrin  #. History of GERD - Continue Protonix  #. H/o Diabetes - Accuchecks achs with RISS coverage - Heart healthy, carb controlled diet - Hold Janumet  Admission status: Inpatient, telemetry IV Fluids: Normal saline Diet/Nutrition: Heart healthy, carb controlled Consults called: None  DVT Px: Lovenox, SCDs and early ambulation. Code Status: Full Code  Disposition Plan: To home in 1 to 2 days  All the records are reviewed and case discussed with ED provider. Management plans discussed with the patient and/or family who express understanding and agree with plan of care.  Jerrianne Hartin D.O. on 10/18/2016 at 9:11 PM Between 7am to 6pm - Pager - [phone] After 6pm go to [email or web address] - [employer] [facility] [place] [facility] Office [phone] CC: Primary care physician; Casilda Carls, MD   10/18/2016, 9:11 PM

## 2016-10-19 ENCOUNTER — Telehealth: Payer: Self-pay

## 2016-10-19 DIAGNOSIS — E871 Hypo-osmolality and hyponatremia: Secondary | ICD-10-CM | POA: Diagnosis not present

## 2016-10-19 DIAGNOSIS — G92 Toxic encephalopathy: Secondary | ICD-10-CM | POA: Diagnosis not present

## 2016-10-19 DIAGNOSIS — R4182 Altered mental status, unspecified: Secondary | ICD-10-CM | POA: Diagnosis not present

## 2016-10-19 DIAGNOSIS — E119 Type 2 diabetes mellitus without complications: Secondary | ICD-10-CM | POA: Diagnosis not present

## 2016-10-19 DIAGNOSIS — N390 Urinary tract infection, site not specified: Secondary | ICD-10-CM | POA: Diagnosis not present

## 2016-10-19 LAB — CBC
HCT: 36.8 % — ABNORMAL LOW (ref 40.0–52.0)
Hemoglobin: 12.5 g/dL — ABNORMAL LOW (ref 13.0–18.0)
MCH: 30.6 pg (ref 26.0–34.0)
MCHC: 34 g/dL (ref 32.0–36.0)
MCV: 90.2 fL (ref 80.0–100.0)
PLATELETS: 156 10*3/uL (ref 150–440)
RBC: 4.07 MIL/uL — AB (ref 4.40–5.90)
RDW: 13.4 % (ref 11.5–14.5)
WBC: 11.6 10*3/uL — ABNORMAL HIGH (ref 3.8–10.6)

## 2016-10-19 LAB — GLUCOSE, CAPILLARY
GLUCOSE-CAPILLARY: 114 mg/dL — AB (ref 65–99)
GLUCOSE-CAPILLARY: 125 mg/dL — AB (ref 65–99)
GLUCOSE-CAPILLARY: 138 mg/dL — AB (ref 65–99)
GLUCOSE-CAPILLARY: 150 mg/dL — AB (ref 65–99)
Glucose-Capillary: 117 mg/dL — ABNORMAL HIGH (ref 65–99)

## 2016-10-19 LAB — PSA: Prostate Specific Ag, Serum: 3.3 ng/mL (ref 0.0–4.0)

## 2016-10-19 LAB — PHOSPHORUS: Phosphorus: 3.5 mg/dL (ref 2.5–4.6)

## 2016-10-19 LAB — MAGNESIUM
MAGNESIUM: 2 mg/dL (ref 1.7–2.4)
Magnesium: 1.6 mg/dL — ABNORMAL LOW (ref 1.7–2.4)

## 2016-10-19 LAB — BASIC METABOLIC PANEL
Anion gap: 7 (ref 5–15)
BUN: 20 mg/dL (ref 6–20)
CHLORIDE: 106 mmol/L (ref 101–111)
CO2: 22 mmol/L (ref 22–32)
CREATININE: 0.88 mg/dL (ref 0.61–1.24)
Calcium: 9 mg/dL (ref 8.9–10.3)
GFR calc non Af Amer: >60 mL/min (ref >60)
GLUCOSE: 122 mg/dL — AB (ref 65–99)
Potassium: 3.4 mmol/L — ABNORMAL LOW (ref 3.5–5.1)
Sodium: 135 mmol/L (ref 135–145)

## 2016-10-19 LAB — TROPONIN I
TROPONIN I: 0.08 ng/mL — AB (ref <0.03)
Troponin I: 0.09 ng/mL (ref <0.03)
Troponin I: 0.08 ng/mL (ref <0.03)

## 2016-10-19 LAB — LACTIC ACID, PLASMA: LACTIC ACID, VENOUS: 0.9 mmol/L (ref 0.5–1.9)

## 2016-10-19 MED ORDER — MAGNESIUM OXIDE 400 (241.3 MG) MG PO TABS
400.0000 mg | ORAL_TABLET | Freq: Every day | ORAL | Status: DC
  Administered 2016-10-19 – 2016-10-20 (×2): 400 mg via ORAL
  Filled 2016-10-19 (×2): qty 1

## 2016-10-19 MED ORDER — MAGNESIUM SULFATE IN D5W 1-5 GM/100ML-% IV SOLN
1.0000 g | Freq: Once | INTRAVENOUS | Status: AC
  Administered 2016-10-19: 1 g via INTRAVENOUS
  Filled 2016-10-19: qty 100

## 2016-10-19 MED ORDER — CARBAMAZEPINE ER 200 MG PO TB12
300.0000 mg | ORAL_TABLET | Freq: Two times a day (BID) | ORAL | Status: DC
  Administered 2016-10-19 – 2016-10-20 (×4): 300 mg via ORAL
  Filled 2016-10-19 (×5): qty 1

## 2016-10-19 MED ORDER — POTASSIUM CHLORIDE CRYS ER 20 MEQ PO TBCR
40.0000 meq | EXTENDED_RELEASE_TABLET | ORAL | Status: AC
  Administered 2016-10-19: 40 meq via ORAL
  Filled 2016-10-19: qty 2

## 2016-10-19 NOTE — Care Management CC44 (Signed)
Condition Code 44 Documentation Completed  Patient Details  Name: Roger Pruitt MRN: [id number] Date of Birth: 03-20-1951   Condition Code 44 given:  Yes Patient signature on Condition Code 44 notice:  Yes Documentation of 2 MD's agreement:   yes Code 44 added to claim:   yes    Beau Fanny, RN 10/19/2016, 1:48 PM

## 2016-10-19 NOTE — Care Management CC44 (Signed)
Condition Code 44 Documentation Completed  Patient Details  Name: TRICE ASPINALL MRN: [id number] Date of Birth: Jun 12, 1950   Condition Code 44 given:    Patient signature on Condition Code 44 notice:    Documentation of 2 MD's agreement:   yes Code 44 added to claim:   yes    Beau Fanny, RN 10/19/2016, 1:20 PM

## 2016-10-19 NOTE — Progress Notes (Signed)
[facility] [place] NAME: Roger Pruitt    MR#:  [id number]  DATE OF BIRTH:  Sep 07, 1950  SUBJECTIVE:  CHIEF COMPLAINT:   Chief Complaint  Patient presents with  . Altered Mental Status   More awake and alert. Pleasant. Afebrile.  REVIEW OF SYSTEMS:    Review of Systems  Constitutional: Positive for malaise/fatigue. Negative for chills and fever.  HENT: Negative for sore throat.   Eyes: Negative for blurred vision, double vision and pain.  Respiratory: Negative for cough, hemoptysis, shortness of breath and wheezing.   Cardiovascular: Negative for chest pain, palpitations, orthopnea and leg swelling.  Gastrointestinal: Negative for abdominal pain, constipation, diarrhea, heartburn, nausea and vomiting.  Genitourinary: Negative for dysuria and hematuria.  Musculoskeletal: Negative for back pain and joint pain.  Skin: Negative for rash.  Neurological: Positive for weakness. Negative for sensory change, speech change, focal weakness and headaches.  Endo/Heme/Allergies: Does not bruise/bleed easily.  Psychiatric/Behavioral: Negative for depression. The patient is not nervous/anxious.     DRUG ALLERGIES:   Allergies  Allergen Reactions  . Morphine And Related Nausea And Vomiting    Other reaction(s): Nausea And Vomiting  . Synalgos-Dc [Aspirin-Caff-Dihydrocodeine] Itching and Other (See Comments)    Reaction:  Stinging   . Synchlor A [Chlorpheniramine] Rash    Patient doesn't recall this allergy.    VITALS:  Blood pressure (!) 155/84, pulse 84, temperature 97.8 F (36.6 C), resp. rate 18, height 6\' 3"  (1.905 m), weight 89.1 kg (196 lb 6.4 oz), SpO2 96 %.  PHYSICAL EXAMINATION:   Physical Exam  GENERAL:  67 y.o.-year-old patient lying in the bed with no acute distress.  EYES: Pupils equal, round, reactive to light and accommodation. No scleral icterus. Extraocular muscles intact.  HEENT: Head atraumatic, normocephalic. Oropharynx  and nasopharynx clear.  NECK:  Supple, no jugular venous distention. No thyroid enlargement, no tenderness.  LUNGS: Normal breath sounds bilaterally, no wheezing, rales, rhonchi. No use of accessory muscles of respiration.  CARDIOVASCULAR: S1, S2 normal. No murmurs, rubs, or gallops.  ABDOMEN: Soft, nontender, nondistended. Bowel sounds present. No organomegaly or mass.  EXTREMITIES: No cyanosis, clubbing or edema b/l.    NEUROLOGIC: Cranial nerves II through XII are intact. No focal Motor or sensory deficits b/l.   PSYCHIATRIC: The patient is alert and Awake. SKIN: No obvious rash, lesion, or ulcer.   LABORATORY PANEL:   CBC  Recent Labs Lab 10/19/16 0520  WBC 11.6*  HGB 12.5*  HCT 36.8*  PLT 156   ------------------------------------------------------------------------------------------------------------------ Chemistries   Recent Labs Lab 10/18/16 1810  10/19/16 0520 10/19/16 1058  NA 134*  --  135  --   K 3.6  --  3.4*  --   CL 104  --  106  --   CO2 22  --  22  --   GLUCOSE 112*  --  122*  --   BUN 20  --  20  --   CREATININE 0.85  --  0.88  --   CALCIUM 9.1  --  9.0  --   MG  --   < >  --  2.0  AST 16  --   --   --   ALT 13*  --   --   --   ALKPHOS 55  --   --   --   BILITOT 0.7  --   --   --   < > = values in this interval not displayed. ------------------------------------------------------------------------------------------------------------------  Cardiac Enzymes  Recent Labs Lab 10/19/16 1058  TROPONINI 0.08*   ------------------------------------------------------------------------------------------------------------------  RADIOLOGY:  Ct Head Wo Contrast  Result Date: 10/18/2016 CLINICAL DATA:  Acute confusion, history of bipolar disorder EXAM: CT HEAD WITHOUT CONTRAST TECHNIQUE: Contiguous axial images were obtained from the base of the skull through the vertex without intravenous contrast. COMPARISON:  07/29/2015 FINDINGS: Brain: Mild atrophic  changes are noted. No findings to suggest acute hemorrhage, acute infarction or space-occupying mass lesion are seen. A few small lacunar infarcts are noted and stable from the prior exam. One in the right basal ganglia and a second within the inferior aspect of the left basal ganglia. Vascular: No hyperdense vessel or unexpected calcification. Skull: Normal. Negative for fracture or focal lesion. Sinuses/Orbits: No acute finding. Other: None IMPRESSION: Atrophic changes and stable lacunar infarcts. No acute abnormality noted. Electronically Signed   By: Inez Catalina M.D.   On: 10/18/2016 18:51   Dg Chest Port 1 View  Result Date: 10/18/2016 CLINICAL DATA:  Confusion and history of bipolar disorder EXAM: PORTABLE CHEST 1 VIEW COMPARISON:  02/26/2016 FINDINGS: Cardiac shadow is within normal limits. Postsurgical changes are noted. The lungs are clear bilaterally. Previously seen nodular densities are not well appreciated on this exam. IMPRESSION: No acute abnormality noted. Electronically Signed   By: Inez Catalina M.D.   On: 10/18/2016 18:54     ASSESSMENT AND PLAN:   This is a 66 y.o. male with a history of Bipolar, schizoaffective disorder, coronary artery disease, GERD, hypertension, hyperlipidemia, Parkinson's disease, diabetes  now being admitted with:  #. Acute toxic encephalopathy due to UTI - improving - IV Rocephin, IV fluid - Follow up blood,urine cultures  #. Elevated troponin, due to demand Not MI No up trend on troponin. No CP/SOB  #. History of hypertension -Continue Norvasc, lisinopril, Coreg  #. History of BPH - Continue Flomax  #. History of CAD - Continue Plavix  #. History of bipolar disorder - Continue Wellbutrin  #. H/o Diabetes - Accuchecks achs with SSI coverage -  carb controlled diet - Hold Janumet  All the records are reviewed and case discussed with Care Management/Social Worker Management plans discussed with the patient, family and they are in  agreement.  CODE STATUS: FULL  DVT Prophylaxis: SCDs  TOTAL TIME TAKING CARE OF THIS PATIENT: 30 minutes.   POSSIBLE D/C IN 1-2 DAYS, DEPENDING ON CLINICAL CONDITION.  Hillary Bow R M.D on 10/19/2016 at 12:16 PM  Between 7am to 6pm - Pager - [phone]  After 6pm go to [email or web address] - password [email or web address] [place] [facility]  Office  [phone]  CC: Primary care physician; Casilda Carls, MD  Note: This dictation was prepared with Dragon dictation along with smaller phrase technology. Any transcriptional errors that result from this process are unintentional.

## 2016-10-19 NOTE — Care Management CC44 (Signed)
Condition Code 44 Documentation Completed  Patient Details  Name: Roger Pruitt MRN: [id number] Date of Birth: [birth year]   Condition Code 44 given:  Yes Patient signature on Condition Code 44 notice:  Yes Documentation of 2 MD's agreement:    Code 44 added to claim:       Katrina Stack, RN 10/19/2016, 1:47 PM

## 2016-10-19 NOTE — Care Management Obs Status (Signed)
[facility] NOTIFICATION   Patient Details  Name: Roger Pruitt MRN: [id number] Date of Birth: 1950-12-07   Medicare Observation Status Notification Given:  Yes    Beau Fanny, RN 10/19/2016, 1:50 PM

## 2016-10-19 NOTE — Evaluation (Addendum)
Physical Therapy Evaluation Patient Details Name: Roger Pruitt MRN: [id number] DOB: 04-12-1950 Today's Date: 10/19/2016   History of Present Illness  Pt is a 66 yo M admitted to acute care for altered mental status on 7/24. Prior to admission, pt ModI with all ADL's/IADL's, using RW for amb. Pt able to drive, prior to admission. pMH: Parkinson disease, bipolar, schizoaffective disorder, CAD, GERD, HTN, HLD, and diabetes.   Clinical Impression  Pt is pleasant and eager to participate for return to prior level of function. Pt performs bed mobility at modI level, tranfers with CGA, and ambulation with CGA and chair follow for safety, due to impaired strength, endurance, and balance. Pt required no rest breaks during amb, though did have toileting break after ~25 ft. Pt with RPE score of 5/10 following amb. Pt requires increased time for all functional mobility task; hx of PD likely contributes to this. One instance of pt incontinence during session, resulting in urination in the floor. Overall, pt responded well to today's treatment with no adverse affects. Pt would benefit from skilled PT to address the previously mentioned impairments and promote return to PLOF. Currently recommending HHPT, pending d/c. This entire session was guided, instructed, and directly supervised by Greggory Stallion, DPT.     Follow Up Recommendations Home health PT    Equipment Recommendations  None recommended by PT    Recommendations for Other Services       Precautions / Restrictions Precautions Precautions: Fall Restrictions Weight Bearing Restrictions: No      Mobility  Bed Mobility Overal bed mobility: Modified Independent             General bed mobility comments: ModI with all bed mobility, requiring no cues for mechancis and safety awareness.   Transfers Overall transfer level: Needs assistance Equipment used: Rolling walker (2 wheeled) Transfers: Sit to/from Stand Sit to Stand: Min guard          General transfer comment: Min guard with STS, requiring increased time, min cues for mechanics, and use of RW for B UE support. Upon standing, pt incontinent, begingin to urinate in floor.   Ambulation/Gait Ambulation/Gait assistance: Min guard;+2 safety/equipment Ambulation Distance (Feet): 230 Feet Assistive device: Rolling walker (2 wheeled) Gait Pattern/deviations: Step-through pattern     General Gait Details: Good reciprocal gait pattern, with tendency to look down, have flexed posture and amb with RW out front. Pt education re: correct mechanics and safety awareness. Pt verbalize and demo good carryover. Chair follow for safety, however, pt required no rest breaks with amb. Pt with RPE 5/10 following amb.  Stairs            Wheelchair Mobility    Modified Rankin (Stroke Patients Only)       Balance Overall balance assessment: Needs assistance Sitting-balance support: Bilateral upper extremity supported;Feet supported Sitting balance-Leahy Scale: Good Sitting balance - Comments: good sitting balance, requiring no cues for mechanics/safety.    Standing balance support: Bilateral upper extremity supported Standing balance-Leahy Scale: Fair Standing balance comment: Fair standing balance, requiring B UE support from RW. Required no cues for safety                              Pertinent Vitals/Pain Pain Assessment: No/denies pain    Home Living Family/patient expects to be discharged to:: Private residence Living Arrangements: Spouse/significant other Available Help at Discharge: Family;Available 24 hours/day Type of Home: Mobile home Home Access: Ramped entrance  Home Layout: One level Home Equipment: Walker - 4 wheels      Prior Function Level of Independence: Independent with assistive device(s)               Hand Dominance        Extremity/Trunk Assessment   Upper Extremity Assessment Upper Extremity Assessment: Overall  WFL for tasks assessed    Lower Extremity Assessment Lower Extremity Assessment: Generalized weakness (MMT to B LE's grossly 4/5)       Communication   Communication: No difficulties  Cognition Arousal/Alertness: Lethargic Behavior During Therapy: Flat affect Overall Cognitive Status: Within Functional Limits for tasks assessed                                 General Comments: Friendly and willing to participate. Pt slightly lethargic, falling aslepp x10 with bed exercises.       General Comments      Exercises Other Exercises Other Exercises: Supine therex performed to B LE's with supervision x10 reps: ankle pumps, quad sets, SLR, hip abd, and glute sets. Pt with good mechancis, requiring no cues.  Other Exercises: toileting: Pt with minA for toileting transfer, with cues on hand placement. CGA for clothing management.    Assessment/Plan    PT Assessment Patient needs continued PT services  PT Problem List Decreased strength;Decreased activity tolerance;Decreased balance;Decreased mobility;Decreased coordination       PT Treatment Interventions DME instruction;Functional mobility training;Therapeutic activities;Therapeutic exercise;Balance training;Neuromuscular re-education;Gait training;Patient/family education;Manual techniques    PT Goals (Current goals can be found in the Care Plan section)  Acute Rehab PT Goals Patient Stated Goal: to go home  PT Goal Formulation: With patient Time For Goal Achievement: 11/02/16 Potential to Achieve Goals: Good    Frequency Min 2X/week   Barriers to discharge        Co-evaluation               AM-PAC PT "6 Clicks" Daily Activity  Outcome Measure Difficulty turning over in bed (including adjusting bedclothes, sheets and blankets)?: A Little Difficulty moving from lying on back to sitting on the side of the bed? : A Little Difficulty sitting down on and standing up from a chair with arms (e.g.,  wheelchair, bedside commode, etc,.)?: Total Help needed moving to and from a bed to chair (including a wheelchair)?: A Little Help needed walking in hospital room?: A Little Help needed climbing 3-5 steps with a railing? : A Lot 6 Click Score: 15    End of Session Equipment Utilized During Treatment: Gait belt Activity Tolerance: Patient tolerated treatment well Patient left: in chair;with call bell/phone within reach;with chair alarm set Nurse Communication: Mobility status PT Visit Diagnosis: Unsteadiness on feet (R26.81);Other abnormalities of gait and mobility (R26.89);Muscle weakness (generalized) (M62.81)    Time: 5462-7035 PT Time Calculation (min) (ACUTE ONLY): 28 min   Charges:         PT G Codes:        Oran Rein PT, SPT  Bevelyn Ngo 10/19/2016, 3:41 PM

## 2016-10-19 NOTE — Telephone Encounter (Addendum)
Called pt. No answer. No DPR. Patient currently admitted.

## 2016-10-19 NOTE — Telephone Encounter (Signed)
-----   Message from Nori Riis, PA-C sent at 10/19/2016  7:58 AM EDT ----- Please let Mr. [place] know that his PSA has increased.  This may be due to the infection.  He needs to have an office visit with me.

## 2016-10-20 DIAGNOSIS — R4182 Altered mental status, unspecified: Secondary | ICD-10-CM | POA: Diagnosis not present

## 2016-10-20 DIAGNOSIS — E871 Hypo-osmolality and hyponatremia: Secondary | ICD-10-CM | POA: Diagnosis not present

## 2016-10-20 DIAGNOSIS — N390 Urinary tract infection, site not specified: Secondary | ICD-10-CM | POA: Diagnosis not present

## 2016-10-20 DIAGNOSIS — G92 Toxic encephalopathy: Secondary | ICD-10-CM | POA: Diagnosis not present

## 2016-10-20 DIAGNOSIS — E119 Type 2 diabetes mellitus without complications: Secondary | ICD-10-CM | POA: Diagnosis not present

## 2016-10-20 LAB — GLUCOSE, CAPILLARY
GLUCOSE-CAPILLARY: 122 mg/dL — AB (ref 65–99)
GLUCOSE-CAPILLARY: 133 mg/dL — AB (ref 65–99)

## 2016-10-20 LAB — HIV ANTIBODY (ROUTINE TESTING W REFLEX): HIV SCREEN 4TH GENERATION: NONREACTIVE

## 2016-10-20 MED ORDER — CEFPODOXIME PROXETIL 200 MG PO TABS: 200.0000 mg | ORAL_TABLET | Freq: Two times a day (BID) | ORAL | 0 refills | Status: DC

## 2016-10-20 NOTE — Progress Notes (Signed)
Patient alert and oriented, vss, no complaints of pain.  1 new medications.  Patient repeats back understanding and has no questions.  Wife to pick up patient at visitors entrance.  Will escort out of hospital via wheelchair by volunteers.

## 2016-10-20 NOTE — Care Management (Signed)
Patient is agreeable to home physical therapy, RN  and agency preference is [facility].  Referral called

## 2016-10-20 NOTE — Discharge Instructions (Signed)
Resume diet and activity as before ° ° °

## 2016-10-20 NOTE — Discharge Summary (Signed)
[facility] [place] NAME: Roger Pruitt    MR#:  [id number]  DATE OF BIRTH:  1951/01/01  DATE OF ADMISSION:  10/18/2016 ADMITTING PHYSICIAN: Harvie Bridge, DO  DATE OF DISCHARGE: 10/20/2016  2:30 PM  PRIMARY CARE PHYSICIAN: Casilda Carls, MD   ADMISSION DIAGNOSIS:  Delirium [R41.0] Elevated troponin I level [R74.8] Fever, unspecified fever cause [R50.9] Urinary tract infection without hematuria, site unspecified [N39.0] Altered mental status, unspecified altered mental status type [R41.82]  DISCHARGE DIAGNOSIS:  Active Problems:   Altered mental status   Altered mental state   SECONDARY DIAGNOSIS:   Past Medical History:  Diagnosis Date  . Abdominal pain   . Acute cystitis without hematuria   . Anemia   . Anginal pain ([place])   . Arthritis   . Bipolar disorder ([place])    BIPOLAR/ SCHIZO/ AFFECTIVE   . BPD (bronchopulmonary dysplasia)   . BPH (benign prostatic hyperplasia)   . CAD (coronary artery disease)    a. s/p MI 2001, 2012;  b. s/p prior LCX stenting; b. 05/2012 Abnl MV; c. 05/2012 Cath: patent LCX stent w/ otw mod non-obs dzs->Med Rx.  . Carpal tunnel syndrome   . Disc degeneration, lumbar   . Elevated PSA   . Esophageal reflux   . GERD (gastroesophageal reflux disease)   . Hematuria    history of gross and microscopic  . Hemorrhoid   . History of balanitis   . History of bladder infections   . History of echocardiogram    a. 07/2014 Echo: EF 60-65%, no rwma, nl LA size, nl RV/PASP.  Marland Kitchen History of urethral stricture   . Hyperlipidemia   . Hypertension   . Hypertensive heart disease   . Incomplete bladder emptying   . Iron deficiency anemia, unspecified   . Lower urinary tract symptoms   . Myocardial infarction ([place])    X7  . Neuropathy   . Nocturia   . Obsessive compulsive disorder   . Other dysphagia   . Panic attack   . Parkinson's disease ([place])   . Sleep apnea    SURGICAL TX  . Stroke ([place])   . Type II  diabetes mellitus ([place])   . Unspecified vitamin D deficiency   . Urethral stricture   . Urinary retention   . Varicose vein of leg   . Vitamin D deficiency      ADMITTING HISTORY  HPI: Federick Levene is a 66 y.o. male with a known history of Bipolar, schizoaffective disorder, coronary artery disease, GERD, hypertension, hyperlipidemia, Parkinson's disease, diabetes presents to the emergency department for evaluation of altered mental status.  Patient was in a usual state of health until This morning EMS was called because of altered mental status, confusion and disorientation..  Patient denies fevers/chills, weakness, dizziness, chest pain, shortness of breath, N/V/C/D, abdominal pain, dysuria/frequency, changes in mental status.    Otherwise there has been no change in status. Patient has been taking medication as prescribed and there has been no recent change in medication or diet.  No recent antibiotics.  There has been no recent illness, hospitalizations, travel or sick contacts.    EMS/ED Course: Patient received Rocephin. Medical admission has been requested for further management of altered mental status secondary to urinary tract infection.  HOSPITAL COURSE:   * Acute encephalopathy UTI. Patient was treated with IV ceftriaxone in the hospital. Cultures are showing Escherichia coli. Sensitivities are not available but the patient's prior cultures he has  had pansensitive organisms. He will be switched from IV ceftriaxone to IV ceftriaxone exam. Follow-up with primary care physician within a week. Afebrile. Encephalopathy has resolved. No dysuria or frequency at this time.  * Mild elevation in troponin. Troponin was repeated and did not have any significant change. EKG showed no acute changes. No chest pain or shortness of breath. This is likely due to infection. Not MI.  Patient's other comorbidities remained stable. No change in medications.  Seen by physical therapy and home health  recommended.  CONSULTS OBTAINED:    DRUG ALLERGIES:   Allergies  Allergen Reactions  . Morphine And Related Nausea And Vomiting    Other reaction(s): Nausea And Vomiting  . Synalgos-Dc [Aspirin-Caff-Dihydrocodeine] Itching and Other (See Comments)    Reaction:  Stinging   . Synchlor A [Chlorpheniramine] Rash    Patient doesn't recall this allergy.    DISCHARGE MEDICATIONS:   Discharge Medication List as of 10/20/2016  1:17 PM    START taking these medications   Details  cefpodoxime (VANTIN) 200 MG tablet Take 1 tablet (200 mg total) by mouth 2 (two) times daily., Starting Thu 10/20/2016, Normal      CONTINUE these medications which have NOT CHANGED   Details  acetaminophen (TYLENOL) 500 MG tablet Take 1,000 mg by mouth every 6 (six) hours as needed (pain)., Historical Med    amLODipine (NORVASC) 10 MG tablet Take 1 tablet by mouth daily., Starting Thu 10/06/2016, Historical Med    atorvastatin (LIPITOR) 40 MG tablet Take 1 tablet (40 mg total) by mouth daily at 6 PM., Starting Wed 12/30/2015, Print    budesonide-formoterol (SYMBICORT) 160-4.5 MCG/ACT inhaler Inhale 2 puffs into the lungs 2 (two) times daily., Starting Fri 09/16/2016, Sample    buPROPion (WELLBUTRIN XL) 300 MG 24 hr tablet Take 300 mg by mouth daily. Takes at noon., Historical Med    Carbamazepine (EQUETRO) 300 MG CP12 Take 300 mg by mouth 2 (two) times daily. , Historical Med    carvedilol (COREG) 12.5 MG tablet Take 1 tablet by mouth 2 (two) times daily., Starting Tue 09/27/2016, Historical Med    clopidogrel (PLAVIX) 75 MG tablet Take 75 mg by mouth daily., Starting Wed 11/25/2015, Historical Med    doxepin (SINEQUAN) 25 MG capsule Take 25 mg by mouth daily., Starting Tue 08/02/2016, Historical Med    fluticasone (FLONASE) 50 MCG/ACT nasal spray Place 1 spray into both nostrils daily., Historical Med    gabapentin (NEURONTIN) 300 MG capsule Take 300 mg by mouth as directed., Starting Tue 08/02/2016, Historical  Med    Incontinence Supply Disposable (DEPEND ADJUSTABLE UNDERWEAR LG) MISC Wear pads tid, Print    JANUMET 50-1000 MG tablet Take 1 tablet by mouth 2 (two) times daily., Starting Tue 08/02/2016, Historical Med    lisinopril (PRINIVIL,ZESTRIL) 20 MG tablet Take 20 mg by mouth daily., Starting Tue 08/02/2016, Historical Med    LORazepam (ATIVAN) 1 MG tablet Take 1-2 tablets (1-2 mg total) by mouth 2 (two) times daily. Pt takes one tablet in the morning and two at bedtime., Starting Mon 01/04/2016, Print    pantoprazole (PROTONIX) 40 MG tablet Take 40 mg by mouth daily. Takes at noon., Historical Med    risperidone (RISPERDAL) 4 MG tablet Take 4 mg by mouth daily., Starting Tue 08/02/2016, Historical Med    tamsulosin (FLOMAX) 0.4 MG CAPS capsule Take 0.4 mg by mouth., Historical Med    topiramate (TOPAMAX) 100 MG tablet Take 100 mg by mouth 3 (three) times daily. Takes  at noon., Historical Med    traMADol (ULTRAM) 50 MG tablet Take 50 mg by mouth as directed., Starting Mon 08/08/2016, Historical Med    polyethylene glycol-electrolytes (TRILYTE) 420 g solution Take 4,000 mLs by mouth as directed. Drink one 8 oz glass every 20 mins until stools are clear, Starting Tue 03/29/2016, Normal      STOP taking these medications     amoxicillin-clavulanate (AUGMENTIN) 875-125 MG tablet         Today   VITAL SIGNS:  Blood pressure 140/76, pulse 73, temperature 98.6 F (37 C), temperature source Oral, resp. rate 18, height 6\' 3"  (1.905 m), weight 89.1 kg (196 lb 6.4 oz), SpO2 97 %.  I/O:   Intake/Output Summary (Last 24 hours) at 10/20/16 1542 Last data filed at 10/20/16 1336  Gross per 24 hour  Intake          2543.75 ml  Output             1525 ml  Net          1018.75 ml    PHYSICAL EXAMINATION:  Physical Exam  GENERAL:  66 y.o.-year-old patient lying in the bed with no acute distress.  LUNGS: Normal breath sounds bilaterally, no wheezing, rales,rhonchi or crepitation. No use of  accessory muscles of respiration.  CARDIOVASCULAR: S1, S2 normal. No murmurs, rubs, or gallops.  ABDOMEN: Soft, non-tender, non-distended. Bowel sounds present. No organomegaly or mass.  NEUROLOGIC: Moves all 4 extremities. PSYCHIATRIC: The patient is alert and oriented x 3.  SKIN: No obvious rash, lesion, or ulcer.   DATA REVIEW:   CBC  Recent Labs Lab 10/19/16 0520  WBC 11.6*  HGB 12.5*  HCT 36.8*  PLT 156    Chemistries   Recent Labs Lab 10/18/16 1810  10/19/16 0520 10/19/16 1058  NA 134*  --  135  --   K 3.6  --  3.4*  --   CL 104  --  106  --   CO2 22  --  22  --   GLUCOSE 112*  --  122*  --   BUN 20  --  20  --   CREATININE 0.85  --  0.88  --   CALCIUM 9.1  --  9.0  --   MG  --   < >  --  2.0  AST 16  --   --   --   ALT 13*  --   --   --   ALKPHOS 55  --   --   --   BILITOT 0.7  --   --   --   < > = values in this interval not displayed.  Cardiac Enzymes  Recent Labs Lab 10/19/16 1058  TROPONINI 0.08*    Microbiology Results  Results for orders placed or performed during the hospital encounter of 10/18/16  Culture, blood (routine x 2)     Status: None (Preliminary result)   Collection Time: 10/18/16  6:10 PM  Result Value Ref Range Status   Specimen Description BLOOD BLOOD LEFT FOREARM  Final   Special Requests   Final    BOTTLES DRAWN AEROBIC AND ANAEROBIC Blood Culture results may not be optimal due to an excessive volume of blood received in culture bottles   Culture NO GROWTH 2 DAYS  Final   Report Status PENDING  Incomplete  Culture, blood (routine x 2)     Status: None (Preliminary result)   Collection Time: 10/18/16  6:10 PM  Result Value Ref Range Status   Specimen Description BLOOD BLOOD LEFT HAND  Final   Special Requests   Final    BOTTLES DRAWN AEROBIC AND ANAEROBIC Blood Culture adequate volume   Culture NO GROWTH 2 DAYS  Final   Report Status PENDING  Incomplete  Urine culture     Status: Abnormal (Preliminary result)    Collection Time: 10/18/16  7:40 PM  Result Value Ref Range Status   Specimen Description URINE, RANDOM  Final   Special Requests NONE  Final   Culture >=100,000 COLONIES/mL ESCHERICHIA COLI (A)  Final   Report Status PENDING  Incomplete    RADIOLOGY:  Ct Head Wo Contrast  Result Date: 10/18/2016 CLINICAL DATA:  Acute confusion, history of bipolar disorder EXAM: CT HEAD WITHOUT CONTRAST TECHNIQUE: Contiguous axial images were obtained from the base of the skull through the vertex without intravenous contrast. COMPARISON:  07/29/2015 FINDINGS: Brain: Mild atrophic changes are noted. No findings to suggest acute hemorrhage, acute infarction or space-occupying mass lesion are seen. A few small lacunar infarcts are noted and stable from the prior exam. One in the right basal ganglia and a second within the inferior aspect of the left basal ganglia. Vascular: No hyperdense vessel or unexpected calcification. Skull: Normal. Negative for fracture or focal lesion. Sinuses/Orbits: No acute finding. Other: None IMPRESSION: Atrophic changes and stable lacunar infarcts. No acute abnormality noted. Electronically Signed   By: Inez Catalina M.D.   On: 10/18/2016 18:51   Dg Chest Port 1 View  Result Date: 10/18/2016 CLINICAL DATA:  Confusion and history of bipolar disorder EXAM: PORTABLE CHEST 1 VIEW COMPARISON:  02/26/2016 FINDINGS: Cardiac shadow is within normal limits. Postsurgical changes are noted. The lungs are clear bilaterally. Previously seen nodular densities are not well appreciated on this exam. IMPRESSION: No acute abnormality noted. Electronically Signed   By: Inez Catalina M.D.   On: 10/18/2016 18:54    Follow up with PCP in 1 week.  Management plans discussed with the patient, family and they are in agreement.  CODE STATUS:     Code Status Orders        Start     Ordered   10/18/16 2307  Full code  Continuous     10/18/16 2306    Code Status History    Date Active Date Inactive Code  Status Order ID Comments User Context   12/31/2015  3:48 PM 01/04/2016  6:01 PM Full Code [id number]  Theodoro Grist, MD Inpatient   12/25/2015  1:32 PM 12/30/2015  4:40 PM Full Code [id number]  John Giovanni, PA-C Inpatient   07/28/2015 11:29 PM 07/30/2015  9:21 PM Full Code [id number]  Harrie Foreman, MD Inpatient   07/21/2015 12:29 AM 07/23/2015  5:33 PM Full Code [id number]  Lance Coon, MD Inpatient   08/23/2014  4:52 PM 08/24/2014  4:09 PM Full Code [id number]  Demetrios Loll, MD Inpatient   01/01/2013  4:19 AM 01/01/2013  8:29 PM Full Code [id number]  Toy Baker, MD Inpatient      TOTAL TIME TAKING CARE OF THIS PATIENT ON DAY OF DISCHARGE: more than 30 minutes.   Hillary Bow R M.D on 10/20/2016 at 3:42 PM  Between 7am to 6pm - Pager - [phone]  After 6pm go to [email or web address] - password [email or web address] [facility]  Office  [phone]  CC: Primary care physician; Casilda Carls, MD  Note: This dictation was prepared with Dragon dictation along with smaller phrase technology. Any transcriptional  errors that result from this process are unintentional.

## 2016-10-21 DIAGNOSIS — G2119 Other drug induced secondary parkinsonism: Secondary | ICD-10-CM | POA: Diagnosis not present

## 2016-10-21 DIAGNOSIS — N3000 Acute cystitis without hematuria: Secondary | ICD-10-CM | POA: Diagnosis not present

## 2016-10-21 LAB — URINE CULTURE: Culture: >=100000 — AB

## 2016-10-22 DIAGNOSIS — G2119 Other drug induced secondary parkinsonism: Secondary | ICD-10-CM | POA: Diagnosis not present

## 2016-10-22 DIAGNOSIS — N3000 Acute cystitis without hematuria: Secondary | ICD-10-CM | POA: Diagnosis not present

## 2016-10-23 LAB — CULTURE, BLOOD (ROUTINE X 2)
CULTURE: NO GROWTH
Culture: NO GROWTH
Special Requests: ADEQUATE

## 2016-10-24 DIAGNOSIS — M5126 Other intervertebral disc displacement, lumbar region: Secondary | ICD-10-CM | POA: Diagnosis not present

## 2016-10-24 DIAGNOSIS — M5416 Radiculopathy, lumbar region: Secondary | ICD-10-CM | POA: Diagnosis not present

## 2016-10-25 ENCOUNTER — Ambulatory Visit: Payer: Medicare Other | Admitting: Urology

## 2016-10-25 DIAGNOSIS — G2119 Other drug induced secondary parkinsonism: Secondary | ICD-10-CM | POA: Diagnosis not present

## 2016-10-25 DIAGNOSIS — N3000 Acute cystitis without hematuria: Secondary | ICD-10-CM | POA: Diagnosis not present

## 2016-10-26 ENCOUNTER — Ambulatory Visit (INDEPENDENT_AMBULATORY_CARE_PROVIDER_SITE_OTHER): Payer: Medicare Other | Admitting: Urology

## 2016-10-26 ENCOUNTER — Encounter: Payer: Self-pay | Admitting: Urology

## 2016-10-26 VITALS — BP 99/59 | HR 70 | Ht 74.0 in | Wt 204.0 lb

## 2016-10-26 DIAGNOSIS — R972 Elevated prostate specific antigen [PSA]: Secondary | ICD-10-CM | POA: Diagnosis not present

## 2016-10-26 DIAGNOSIS — R3129 Other microscopic hematuria: Secondary | ICD-10-CM | POA: Diagnosis not present

## 2016-10-26 DIAGNOSIS — N390 Urinary tract infection, site not specified: Secondary | ICD-10-CM

## 2016-10-26 DIAGNOSIS — N401 Enlarged prostate with lower urinary tract symptoms: Secondary | ICD-10-CM

## 2016-10-26 DIAGNOSIS — N138 Other obstructive and reflux uropathy: Secondary | ICD-10-CM

## 2016-10-26 LAB — URINALYSIS, COMPLETE
Bilirubin, UA: NEGATIVE
Glucose, UA: NEGATIVE
Ketones, UA: NEGATIVE
LEUKOCYTES UA: NEGATIVE
NITRITE UA: NEGATIVE
PH UA: 5.5 (ref 5.0–7.5)
Protein, UA: NEGATIVE
Specific Gravity, UA: 1.015 (ref 1.005–1.030)
Urobilinogen, Ur: 0.2 mg/dL (ref 0.2–1.0)

## 2016-10-26 LAB — MICROSCOPIC EXAMINATION

## 2016-10-26 NOTE — Progress Notes (Signed)
error 

## 2016-10-26 NOTE — Progress Notes (Signed)
10/26/2016 2:09 PM   Roger Pruitt Dec 16, 1950 [id number]  Referring provider: Casilda Carls, [place], [place] [address]  Chief Complaint  Patient presents with  . Benign Prostatic Hypertrophy    1year    HPI: 66 year old male who presents today for routine follow-up.  He has a personal history of BPH status post KTP laser ablation with Dr. Elnoria Howard in 03/2014. Since then, his continued to self catheter 3 times a day.  He is currently also on Flomax.  He also has a personal history of gross hematuria. He underwent hematuria workup and 05/2015 into no GU abnormalities were found other than left lateral prostatic regrowth.   He is also noted to have a recent rise in his PSA. It was previously 0.6 on 10/26/2015. It is risen to 3.3 on 10/18/2016.  Most recent rectal exam on 09/19/2016 45 g with a firm right lobe but no discrete nodules. Repeat PSA drawn today, pending.     He does also have severe baseline erectile dysfunction as well as decreased libido.    His last visit, he was admitted to the hospital with delirium related to UTI in 7/18.  Culture grew pansensitive E. coli.  PMH: Past Medical History:  Diagnosis Date  . Abdominal pain   . Acute cystitis without hematuria   . Anemia   . Anginal pain ([place])   . Arthritis   . Bipolar disorder ([place])    BIPOLAR/ SCHIZO/ AFFECTIVE   . BPD (bronchopulmonary dysplasia)   . BPH (benign prostatic hyperplasia)   . CAD (coronary artery disease)    a. s/p MI 2001, 2012;  b. s/p prior LCX stenting; b. 05/2012 Abnl MV; c. 05/2012 Cath: patent LCX stent w/ otw mod non-obs dzs->Med Rx.  . Carpal tunnel syndrome   . Disc degeneration, lumbar   . Elevated PSA   . Esophageal reflux   . GERD (gastroesophageal reflux disease)   . Hematuria    history of gross and microscopic  . Hemorrhoid   . History of balanitis   . History of bladder infections   . History of echocardiogram    a. 07/2014 Echo: EF 60-65%, no rwma, nl LA size,  nl RV/PASP.  Marland Kitchen History of urethral stricture   . Hyperlipidemia   . Hypertension   . Hypertensive heart disease   . Incomplete bladder emptying   . Iron deficiency anemia, unspecified   . Lower urinary tract symptoms   . Myocardial infarction ([place])    X7  . Neuropathy   . Nocturia   . Obsessive compulsive disorder   . Other dysphagia   . Panic attack   . Parkinson's disease ([place])   . Sleep apnea    SURGICAL TX  . Stroke ([place])   . Type II diabetes mellitus ([place])   . Unspecified vitamin D deficiency   . Urethral stricture   . Urinary retention   . Varicose vein of leg   . Vitamin D deficiency     Surgical History: Past Surgical History:  Procedure Laterality Date  . BACK SURGERY     lumbar disc  . BACK SURGERY    . CARDIAC CATHETERIZATION  6/10;2011;Aug.2012;Oct. 2012;   . CARDIAC CATHETERIZATION  05/2012   [facility]; EF 60%, patent stent in the left circumflex, moderate three-vessel disease with no flow-limiting lesions. Unchanged from most recent catheterization  . CARDIAC CATHETERIZATION N/A 12/10/2015   Procedure: Left Heart Cath and Coronary Angiography;  Surgeon: Corey Skains, MD;  Location:  [facility];  Service: Cardiovascular;  Laterality: N/A;  . CARPAL TUNNEL RELEASE  2009   left  . CORONARY ANGIOPLASTY  2011 & 2001   s/p stent  . CORONARY ARTERY BYPASS GRAFT     stent placement  . CORONARY ARTERY BYPASS GRAFT N/A 12/25/2015   Procedure: CORONARY ARTERY BYPASS GRAFTING (CABG) x4  , using left internal mammary artery and right leg greater saphenous vein harvested endoscopically LIMA-LAD SVG-OM2 SVG-DIAG SVG-PD;  Surgeon: Melrose Nakayama, MD;  Location: [facility];  Service: Open Heart Surgery;  Laterality: N/A;  . [place]  . CYST REMOVAL LEG Right   . SKIN GRAFT    . TEE WITHOUT CARDIOVERSION N/A 12/25/2015   Procedure: TRANSESOPHAGEAL ECHOCARDIOGRAM (TEE);  Surgeon: Melrose Nakayama, MD;  Location: [facility];  Service: Open Heart Surgery;   Laterality: N/A;  . TONSILLECTOMY      Home Medications:  Allergies as of 10/26/2016      Reactions   Morphine And Related Nausea And Vomiting   Other reaction(s): Nausea And Vomiting   Synalgos-dc [aspirin-caff-dihydrocodeine] Itching, Other (See Comments)   Reaction:  Stinging    Synchlor A [chlorpheniramine] Rash   Patient doesn't recall this allergy.      Medication List       Accurate as of 10/26/16 11:59 PM. Always use your most recent med list.          acetaminophen 500 MG tablet Commonly known as:  TYLENOL Take 1,000 mg by mouth every 6 (six) hours as needed (pain).   amLODipine 10 MG tablet Commonly known as:  NORVASC Take 1 tablet by mouth daily.   atorvastatin 40 MG tablet Commonly known as:  LIPITOR Take 1 tablet (40 mg total) by mouth daily at 6 PM.   budesonide-formoterol 160-4.5 MCG/ACT inhaler Commonly known as:  SYMBICORT Inhale 2 puffs into the lungs 2 (two) times daily.   buPROPion 300 MG 24 hr tablet Commonly known as:  WELLBUTRIN XL Take 300 mg by mouth daily. Takes at noon.   Carbamazepine 300 MG Cp12 Commonly known as:  Equetro Take 300 mg by mouth 2 (two) times daily.   carvedilol 12.5 MG tablet Commonly known as:  COREG Take 1 tablet by mouth 2 (two) times daily.   cefpodoxime 200 MG tablet Commonly known as:  VANTIN Take 1 tablet (200 mg total) by mouth 2 (two) times daily.   clopidogrel 75 MG tablet Commonly known as:  PLAVIX Take 75 mg by mouth daily.   DEPEND ADJUSTABLE UNDERWEAR LG Misc Wear pads tid   doxepin 25 MG capsule Commonly known as:  SINEQUAN Take 25 mg by mouth daily.   fluticasone 50 MCG/ACT nasal spray Commonly known as:  FLONASE Place 1 spray into both nostrils daily.   gabapentin 300 MG capsule Commonly known as:  NEURONTIN Take 300 mg by mouth as directed.   JANUMET 50-1000 MG tablet Generic drug:  sitaGLIPtin-metformin Take 1 tablet by mouth 2 (two) times daily.   lisinopril 20 MG  tablet Commonly known as:  PRINIVIL,ZESTRIL Take 20 mg by mouth daily.   LORazepam 1 MG tablet Commonly known as:  ATIVAN Take 1-2 tablets (1-2 mg total) by mouth 2 (two) times daily. Pt takes one tablet in the morning and two at bedtime.   pantoprazole 40 MG tablet Commonly known as:  PROTONIX Take 40 mg by mouth daily. Takes at noon.   polyethylene glycol-electrolytes 420 g solution Commonly known as:  TRILYTE Take  4,000 mLs by mouth as directed. Drink one 8 oz glass every 20 mins until stools are clear   risperidone 4 MG tablet Commonly known as:  RISPERDAL Take 4 mg by mouth daily.   tamsulosin 0.4 MG Caps capsule Commonly known as:  FLOMAX Take 0.4 mg by mouth.   topiramate 100 MG tablet Commonly known as:  TOPAMAX Take 100 mg by mouth 3 (three) times daily. Takes at noon.   traMADol 50 MG tablet Commonly known as:  ULTRAM Take 50 mg by mouth as directed.       Allergies:  Allergies  Allergen Reactions  . Morphine And Related Nausea And Vomiting    Other reaction(s): Nausea And Vomiting  . Synalgos-Dc [Aspirin-Caff-Dihydrocodeine] Itching and Other (See Comments)    Reaction:  Stinging   . Synchlor A [Chlorpheniramine] Rash    Patient doesn't recall this allergy.    Family History: Family History  Problem Relation Age of Onset  . Heart disease Mother   . Heart disease Brother   . Prostate cancer Father   . Heart disease Unknown   . Heart disease Brother   . Kidney disease Neg Hx   . Kidney cancer Neg Hx   . Bladder Cancer Neg Hx     Social History:  reports that he quit smoking about 17 years ago. His smoking use included Cigarettes. He has a 7.50 pack-year smoking history. He has never used smokeless tobacco. He reports that he does not drink alcohol or use drugs.  ROS: UROLOGY Frequent Urination?: Yes Hard to postpone urination?: Yes Burning/pain with urination?: Yes Get up at night to urinate?: Yes Leakage of urine?: Yes Urine stream starts  and stops?: No Trouble starting stream?: No Do you have to strain to urinate?: No Blood in urine?: No Urinary tract infection?: Yes Sexually transmitted disease?: No Injury to kidneys or bladder?: No Painful intercourse?: No Weak stream?: No Erection problems?: Yes Penile pain?: No  Gastrointestinal Nausea?: No Vomiting?: No Indigestion/heartburn?: Yes Diarrhea?: No Constipation?: No  Constitutional Fever: No Night sweats?: No Weight loss?: No Fatigue?: No  Skin Skin rash/lesions?: No Itching?: No  Eyes Blurred vision?: Yes Double vision?: No  Ears/Nose/Throat Sore throat?: No Sinus problems?: Yes  Hematologic/Lymphatic Swollen glands?: No Easy bruising?: No  Cardiovascular Leg swelling?: No Chest pain?: Yes  Respiratory Cough?: Yes Shortness of breath?: Yes  Endocrine Excessive thirst?: Yes  Musculoskeletal Back pain?: Yes Joint pain?: Yes  Neurological Headaches?: No Dizziness?: Yes  Psychologic Depression?: Yes Anxiety?: No  Physical Exam: BP (!) 99/59   Pulse 70   Ht 6\' 2"  (1.88 m)   Wt 204 lb (92.5 kg)   BMI 26.19 kg/m   Constitutional:  Alert and oriented, No acute distress. HEENT: [place] AT, moist mucus membranes.  Trachea midline, no masses. Cardiovascular: No clubbing, cyanosis, or edema. Respiratory: Normal respiratory effort, no increased work of breathing. GI: Abdomen is soft, nontender, nondistended, no abdominal masses GU: No CVA tenderness.  Skin: No rashes, bruises or suspicious lesions. Neurologic: Grossly intact, no focal deficits, moving all 4 extremities. Psychiatric: Normal mood and affect.  Laboratory Data: Lab Results  Component Value Date   WBC 11.6 (H) 10/19/2016   HGB 12.5 (L) 10/19/2016   HCT 36.8 (L) 10/19/2016   MCV 90.2 10/19/2016   PLT 156 10/19/2016    Lab Results  Component Value Date   CREATININE 0.88 10/19/2016    Component     Latest Ref Rng & Units 10/18/2016  Prostate Specific Ag,  Serum     0.0 - 4.0 ng/mL 3.3    Lab Results  Component Value Date   HGBA1C 5.1 12/22/2015    Urinalysis Results for orders placed or performed in visit on 10/26/16  Microscopic Examination  Result Value Ref Range   WBC, UA 0-5 0 - 5 /hpf   RBC, UA 3-10 (A) 0 - 2 /hpf   Epithelial Cells (non renal) 0-10 0 - 10 /hpf  Urinalysis, Complete  Result Value Ref Range   Specific Gravity, UA 1.015 1.005 - 1.030   pH, UA 5.5 5.0 - 7.5   Color, UA Yellow Yellow   Appearance Ur Clear Clear   Leukocytes, UA Negative Negative   Protein, UA Negative Negative/Trace   Glucose, UA Negative Negative   Ketones, UA Negative Negative   RBC, UA 2+ (A) Negative   Bilirubin, UA Negative Negative   Urobilinogen, Ur 0.2 0.2 - 1.0 mg/dL   Nitrite, UA Negative Negative   Microscopic Examination See below:     Pertinent Imaging: No new interval imaging  Assessment & Plan:    1. Microscopic hematuria Microscopic hematuria status post negative workup in 05/2015 Likely related to friable prostatic regrowth If microscopic hematuria persists, would repeat cystoscopy/ RUS in 2019 - Urinalysis, Complete  2. Elevated PSA Repeat PSA pending today, will call with results - PSA  3. BPH with obstruction/lower urinary tract symptoms Plan Flomax Continue CIC 3 times a day  4. Recurrent UTI Dense of UTI today Risk factors for infection include incomplete bladder emptying Patient is likely chronically colonized due to CIC, only treat with symptoms   Return in about 6 months (around 04/28/2017) for with shannon.  Hollice Espy, MD  [facility] [address], [place], [place] [address] [phone]

## 2016-10-27 LAB — PSA: Prostate Specific Ag, Serum: 3 ng/mL (ref 0.0–4.0)

## 2016-11-01 DIAGNOSIS — G2119 Other drug induced secondary parkinsonism: Secondary | ICD-10-CM | POA: Diagnosis not present

## 2016-11-01 DIAGNOSIS — N3000 Acute cystitis without hematuria: Secondary | ICD-10-CM | POA: Diagnosis not present

## 2016-11-03 ENCOUNTER — Telehealth: Payer: Self-pay

## 2016-11-03 NOTE — Telephone Encounter (Signed)
-----   Message from Hollice Espy, MD sent at 11/01/2016 12:54 PM EDT ----- PSA still up slightly.  Recomend repeat PSA in 6 months.  Please ensure that he has the proper follow up with [facility].    Hollice Espy, MD

## 2016-11-03 NOTE — Telephone Encounter (Signed)
Spoke with pt in reference to PSA results and needing a f/u. Pt voiced understanding. Pt requested appts be made and mailed to him. I cant see the schedule for 2019. Can you make these please?

## 2016-11-08 DIAGNOSIS — N3000 Acute cystitis without hematuria: Secondary | ICD-10-CM | POA: Diagnosis not present

## 2016-11-08 DIAGNOSIS — G2119 Other drug induced secondary parkinsonism: Secondary | ICD-10-CM | POA: Diagnosis not present

## 2016-11-08 NOTE — Telephone Encounter (Signed)
DONE

## 2016-11-25 ENCOUNTER — Encounter: Payer: Self-pay | Admitting: Podiatry

## 2016-11-25 ENCOUNTER — Ambulatory Visit (INDEPENDENT_AMBULATORY_CARE_PROVIDER_SITE_OTHER): Payer: Medicare Other | Admitting: Podiatry

## 2016-11-25 DIAGNOSIS — R300 Dysuria: Secondary | ICD-10-CM | POA: Diagnosis not present

## 2016-11-25 DIAGNOSIS — M79676 Pain in unspecified toe(s): Secondary | ICD-10-CM

## 2016-11-25 DIAGNOSIS — E0842 Diabetes mellitus due to underlying condition with diabetic polyneuropathy: Secondary | ICD-10-CM

## 2016-11-25 DIAGNOSIS — B351 Tinea unguium: Secondary | ICD-10-CM

## 2016-12-02 ENCOUNTER — Telehealth: Payer: Self-pay | Admitting: Gastroenterology

## 2016-12-02 NOTE — Telephone Encounter (Signed)
Patient called to schedule a colonoscopy. He stated he now has cardiac clearance

## 2016-12-03 NOTE — Progress Notes (Signed)
SUBJECTIVE Patient with a history of diabetes mellitus presents to office today complaining of elongated, thickened nails. Pain while ambulating in shoes. Patient is unable to trim their own nails.   Past Medical History:  Diagnosis Date  . Abdominal pain   . Acute cystitis without hematuria   . Anemia   . Anginal pain ([place])   . Arthritis   . Bipolar disorder ([place])    BIPOLAR/ SCHIZO/ AFFECTIVE   . BPD (bronchopulmonary dysplasia)   . BPH (benign prostatic hyperplasia)   . CAD (coronary artery disease)    a. s/p MI 2001, 2012;  b. s/p prior LCX stenting; b. 05/2012 Abnl MV; c. 05/2012 Cath: patent LCX stent w/ otw mod non-obs dzs->Med Rx.  . Carpal tunnel syndrome   . Disc degeneration, lumbar   . Elevated PSA   . Esophageal reflux   . GERD (gastroesophageal reflux disease)   . Hematuria    history of gross and microscopic  . Hemorrhoid   . History of balanitis   . History of bladder infections   . History of echocardiogram    a. 07/2014 Echo: EF 60-65%, no rwma, nl LA size, nl RV/PASP.  Marland Kitchen History of urethral stricture   . Hyperlipidemia   . Hypertension   . Hypertensive heart disease   . Incomplete bladder emptying   . Iron deficiency anemia, unspecified   . Lower urinary tract symptoms   . Myocardial infarction ([place])    X7  . Neuropathy   . Nocturia   . Obsessive compulsive disorder   . Other dysphagia   . Panic attack   . Parkinson's disease ([place])   . Sleep apnea    SURGICAL TX  . Stroke ([place])   . Type II diabetes mellitus ([place])   . Unspecified vitamin D deficiency   . Urethral stricture   . Urinary retention   . Varicose vein of leg   . Vitamin D deficiency     OBJECTIVE General Patient is awake, alert, and oriented x 3 and in no acute distress. Derm Skin is dry and supple bilateral. Negative open lesions or macerations. Remaining integument unremarkable. Nails are tender, long, thickened and dystrophic with subungual debris, consistent with onychomycosis,  1-5 bilateral. No signs of infection noted. Vasc  DP and PT pedal pulses palpable bilaterally. Temperature gradient within normal limits.  Neuro Epicritic and protective threshold sensation diminished bilaterally.  Musculoskeletal Exam hammertoes noted to the bilateral feet with increased medial longitudinal arch and rear foot varus consistent with a cavus foot type. Muscular strength within normal limits.  ASSESSMENT 1. Diabetes Mellitus w/ peripheral neuropathy 2. Onychomycosis of nail due to dermatophyte bilateral 3. Pain in foot bilateral 4. Hammertoes the bilateral feet with a cavus foot type  PLAN OF CARE 1. Patient evaluated today. 2. Instructed to maintain good pedal hygiene and foot care. Stressed importance of controlling blood sugar.  3. Mechanical debridement of nails 1-5 bilaterally performed using a nail nipper. Filed with dremel without incident.  4. Today authorization for diabetic shoes was performed and initiated today.  5. Return to clinic in 3 mos.     Roger Pruitt, DPM [facility]  Dr. Edrick Pruitt, DPM    [address],  [place] [address]                Office [phone]  Fax [phone]

## 2016-12-06 DIAGNOSIS — I10 Essential (primary) hypertension: Secondary | ICD-10-CM | POA: Diagnosis not present

## 2016-12-06 DIAGNOSIS — R0602 Shortness of breath: Secondary | ICD-10-CM | POA: Diagnosis not present

## 2016-12-06 DIAGNOSIS — E782 Mixed hyperlipidemia: Secondary | ICD-10-CM | POA: Diagnosis not present

## 2016-12-06 DIAGNOSIS — I2510 Atherosclerotic heart disease of native coronary artery without angina pectoris: Secondary | ICD-10-CM | POA: Diagnosis not present

## 2016-12-15 DIAGNOSIS — G2119 Other drug induced secondary parkinsonism: Secondary | ICD-10-CM | POA: Diagnosis not present

## 2016-12-15 DIAGNOSIS — J309 Allergic rhinitis, unspecified: Secondary | ICD-10-CM | POA: Diagnosis not present

## 2016-12-15 DIAGNOSIS — N400 Enlarged prostate without lower urinary tract symptoms: Secondary | ICD-10-CM | POA: Diagnosis not present

## 2016-12-15 DIAGNOSIS — R300 Dysuria: Secondary | ICD-10-CM | POA: Diagnosis not present

## 2016-12-15 DIAGNOSIS — F329 Major depressive disorder, single episode, unspecified: Secondary | ICD-10-CM | POA: Diagnosis not present

## 2016-12-15 DIAGNOSIS — E538 Deficiency of other specified B group vitamins: Secondary | ICD-10-CM | POA: Diagnosis not present

## 2016-12-15 DIAGNOSIS — R7889 Finding of other specified substances, not normally found in blood: Secondary | ICD-10-CM | POA: Diagnosis not present

## 2016-12-15 DIAGNOSIS — I158 Other secondary hypertension: Secondary | ICD-10-CM | POA: Diagnosis not present

## 2016-12-15 DIAGNOSIS — E559 Vitamin D deficiency, unspecified: Secondary | ICD-10-CM | POA: Diagnosis not present

## 2016-12-15 DIAGNOSIS — I10 Essential (primary) hypertension: Secondary | ICD-10-CM | POA: Diagnosis not present

## 2016-12-15 DIAGNOSIS — N3000 Acute cystitis without hematuria: Secondary | ICD-10-CM | POA: Diagnosis not present

## 2016-12-20 DIAGNOSIS — E559 Vitamin D deficiency, unspecified: Secondary | ICD-10-CM | POA: Diagnosis not present

## 2016-12-20 DIAGNOSIS — E781 Pure hyperglyceridemia: Secondary | ICD-10-CM | POA: Diagnosis not present

## 2016-12-20 DIAGNOSIS — R7989 Other specified abnormal findings of blood chemistry: Secondary | ICD-10-CM | POA: Diagnosis not present

## 2016-12-20 DIAGNOSIS — I10 Essential (primary) hypertension: Secondary | ICD-10-CM | POA: Diagnosis not present

## 2016-12-20 DIAGNOSIS — E539 Vitamin B deficiency, unspecified: Secondary | ICD-10-CM | POA: Diagnosis not present

## 2016-12-20 DIAGNOSIS — R300 Dysuria: Secondary | ICD-10-CM | POA: Diagnosis not present

## 2016-12-20 DIAGNOSIS — F329 Major depressive disorder, single episode, unspecified: Secondary | ICD-10-CM | POA: Diagnosis not present

## 2016-12-20 DIAGNOSIS — F319 Bipolar disorder, unspecified: Secondary | ICD-10-CM | POA: Diagnosis not present

## 2016-12-20 DIAGNOSIS — J309 Allergic rhinitis, unspecified: Secondary | ICD-10-CM | POA: Diagnosis not present

## 2016-12-20 DIAGNOSIS — N400 Enlarged prostate without lower urinary tract symptoms: Secondary | ICD-10-CM | POA: Diagnosis not present

## 2016-12-20 DIAGNOSIS — K219 Gastro-esophageal reflux disease without esophagitis: Secondary | ICD-10-CM | POA: Diagnosis not present

## 2017-01-02 DIAGNOSIS — F250 Schizoaffective disorder, bipolar type: Secondary | ICD-10-CM | POA: Diagnosis not present

## 2017-01-05 ENCOUNTER — Ambulatory Visit: Payer: Medicare Other | Admitting: Gastroenterology

## 2017-01-08 DIAGNOSIS — Z23 Encounter for immunization: Secondary | ICD-10-CM | POA: Diagnosis not present

## 2017-01-17 DIAGNOSIS — H2513 Age-related nuclear cataract, bilateral: Secondary | ICD-10-CM | POA: Diagnosis not present

## 2017-01-23 ENCOUNTER — Telehealth: Payer: Self-pay | Admitting: Podiatry

## 2017-01-23 DIAGNOSIS — M5416 Radiculopathy, lumbar region: Secondary | ICD-10-CM | POA: Diagnosis not present

## 2017-01-23 DIAGNOSIS — M48062 Spinal stenosis, lumbar region with neurogenic claudication: Secondary | ICD-10-CM | POA: Diagnosis not present

## 2017-01-23 DIAGNOSIS — M5126 Other intervertebral disc displacement, lumbar region: Secondary | ICD-10-CM | POA: Diagnosis not present

## 2017-01-23 NOTE — Telephone Encounter (Signed)
Patient called asking for status of his diabetic shoes that were ordered early September. Per [employer] the order was cancelled because they never received paperwork back from Dr. Rosario Jacks.  Patient has called PCP ( dr. Rosario Jacks) and they said that they would send the paperwork again. Pt would like to be contacted once the paperwork is received.

## 2017-01-24 NOTE — Telephone Encounter (Signed)
Returned patient call and informed him that paperwork has not been sent back to [facility] from pcp.  I have printed off paperwork so patient will pick up at [facility] office and take to pcp for signature.  Patient verbally understood.

## 2017-01-25 ENCOUNTER — Telehealth: Payer: Self-pay | Admitting: Podiatry

## 2017-01-25 NOTE — Telephone Encounter (Signed)
Pt left a voicemail on 10/29 @ 1133am on Rick's voicemail asking about his diabetic shoes.He is a [facility] pt.

## 2017-02-03 DIAGNOSIS — H2512 Age-related nuclear cataract, left eye: Secondary | ICD-10-CM | POA: Diagnosis not present

## 2017-02-08 ENCOUNTER — Ambulatory Visit: Payer: Medicare Other | Admitting: Gastroenterology

## 2017-02-08 ENCOUNTER — Other Ambulatory Visit: Payer: Self-pay

## 2017-02-08 DIAGNOSIS — Z1211 Encounter for screening for malignant neoplasm of colon: Secondary | ICD-10-CM

## 2017-02-09 ENCOUNTER — Encounter: Payer: Self-pay | Admitting: *Deleted

## 2017-02-14 ENCOUNTER — Ambulatory Visit: Payer: Medicare Other | Admitting: Anesthesiology

## 2017-02-14 ENCOUNTER — Encounter: Payer: Self-pay | Admitting: *Deleted

## 2017-02-14 ENCOUNTER — Encounter
Admission: RE | Disposition: A | Discharge status: Home / Self Care | Payer: Self-pay | Source: Ambulatory Visit | Attending: Ophthalmology

## 2017-02-14 ENCOUNTER — Other Ambulatory Visit: Payer: Self-pay

## 2017-02-14 ENCOUNTER — Ambulatory Visit
Admission: RE | Admit: 2017-02-14 | Discharge: 2017-02-14 | Disposition: A | Discharge status: Home / Self Care | Payer: Medicare Other | Source: Ambulatory Visit | Attending: Ophthalmology | Admitting: Ophthalmology

## 2017-02-14 DIAGNOSIS — Z955 Presence of coronary angioplasty implant and graft: Secondary | ICD-10-CM | POA: Diagnosis not present

## 2017-02-14 DIAGNOSIS — Z888 Allergy status to other drugs, medicaments and biological substances status: Secondary | ICD-10-CM | POA: Insufficient documentation

## 2017-02-14 DIAGNOSIS — Z8673 Personal history of transient ischemic attack (TIA), and cerebral infarction without residual deficits: Secondary | ICD-10-CM | POA: Insufficient documentation

## 2017-02-14 DIAGNOSIS — E119 Type 2 diabetes mellitus without complications: Secondary | ICD-10-CM | POA: Diagnosis not present

## 2017-02-14 DIAGNOSIS — I10 Essential (primary) hypertension: Secondary | ICD-10-CM | POA: Insufficient documentation

## 2017-02-14 DIAGNOSIS — E785 Hyperlipidemia, unspecified: Secondary | ICD-10-CM | POA: Diagnosis not present

## 2017-02-14 DIAGNOSIS — Z951 Presence of aortocoronary bypass graft: Secondary | ICD-10-CM | POA: Insufficient documentation

## 2017-02-14 DIAGNOSIS — F419 Anxiety disorder, unspecified: Secondary | ICD-10-CM | POA: Diagnosis not present

## 2017-02-14 DIAGNOSIS — E1140 Type 2 diabetes mellitus with diabetic neuropathy, unspecified: Secondary | ICD-10-CM | POA: Insufficient documentation

## 2017-02-14 DIAGNOSIS — Z79899 Other long term (current) drug therapy: Secondary | ICD-10-CM | POA: Diagnosis not present

## 2017-02-14 DIAGNOSIS — G20 Parkinson's disease: Secondary | ICD-10-CM | POA: Diagnosis not present

## 2017-02-14 DIAGNOSIS — Z7984 Long term (current) use of oral hypoglycemic drugs: Secondary | ICD-10-CM | POA: Diagnosis not present

## 2017-02-14 DIAGNOSIS — Z885 Allergy status to narcotic agent status: Secondary | ICD-10-CM | POA: Insufficient documentation

## 2017-02-14 DIAGNOSIS — N400 Enlarged prostate without lower urinary tract symptoms: Secondary | ICD-10-CM | POA: Insufficient documentation

## 2017-02-14 DIAGNOSIS — F250 Schizoaffective disorder, bipolar type: Secondary | ICD-10-CM | POA: Insufficient documentation

## 2017-02-14 DIAGNOSIS — F319 Bipolar disorder, unspecified: Secondary | ICD-10-CM | POA: Diagnosis not present

## 2017-02-14 DIAGNOSIS — H2512 Age-related nuclear cataract, left eye: Secondary | ICD-10-CM | POA: Diagnosis not present

## 2017-02-14 DIAGNOSIS — I2510 Atherosclerotic heart disease of native coronary artery without angina pectoris: Secondary | ICD-10-CM | POA: Diagnosis not present

## 2017-02-14 DIAGNOSIS — K219 Gastro-esophageal reflux disease without esophagitis: Secondary | ICD-10-CM | POA: Insufficient documentation

## 2017-02-14 DIAGNOSIS — J45909 Unspecified asthma, uncomplicated: Secondary | ICD-10-CM | POA: Diagnosis not present

## 2017-02-14 DIAGNOSIS — E7800 Pure hypercholesterolemia, unspecified: Secondary | ICD-10-CM | POA: Diagnosis not present

## 2017-02-14 DIAGNOSIS — M1990 Unspecified osteoarthritis, unspecified site: Secondary | ICD-10-CM | POA: Insufficient documentation

## 2017-02-14 DIAGNOSIS — I252 Old myocardial infarction: Secondary | ICD-10-CM | POA: Diagnosis not present

## 2017-02-14 HISTORY — DX: Unspecified asthma, uncomplicated: J45.909

## 2017-02-14 HISTORY — DX: Depression, unspecified: F32.A

## 2017-02-14 HISTORY — DX: Major depressive disorder, single episode, unspecified: F32.9

## 2017-02-14 HISTORY — PX: CATARACT EXTRACTION W/PHACO: SHX586

## 2017-02-14 HISTORY — DX: Dyspnea, unspecified: R06.00

## 2017-02-14 LAB — GLUCOSE, CAPILLARY: GLUCOSE-CAPILLARY: 110 mg/dL — AB (ref 65–99)

## 2017-02-14 SURGERY — PHACOEMULSIFICATION, CATARACT, WITH IOL INSERTION
Anesthesia: Monitor Anesthesia Care | Site: Eye | Laterality: Left | Wound class: Clean

## 2017-02-14 MED ORDER — NA CHONDROIT SULF-NA HYALURON 40-17 MG/ML IO SOLN
INTRAOCULAR | Status: AC
  Filled 2017-02-14: qty 1

## 2017-02-14 MED ORDER — ARMC OPHTHALMIC DILATING DROPS
1.0000 "application " | OPHTHALMIC | Status: AC
  Administered 2017-02-14 (×3): 1 via OPHTHALMIC

## 2017-02-14 MED ORDER — LIDOCAINE HCL (PF) 4 % IJ SOLN
INTRAOCULAR | Status: DC | PRN
  Administered 2017-02-14: 2 mL via OPHTHALMIC

## 2017-02-14 MED ORDER — POVIDONE-IODINE 5 % OP SOLN
OPHTHALMIC | Status: AC
  Filled 2017-02-14: qty 30

## 2017-02-14 MED ORDER — MOXIFLOXACIN HCL 0.5 % OP SOLN: 1.0000 [drp] | OPHTHALMIC | Status: DC | PRN

## 2017-02-14 MED ORDER — CARBACHOL 0.01 % IO SOLN
INTRAOCULAR | Status: DC | PRN
  Administered 2017-02-14: .5 mL via INTRAOCULAR

## 2017-02-14 MED ORDER — EPINEPHRINE PF 1 MG/ML IJ SOLN
INTRAMUSCULAR | Status: AC
  Filled 2017-02-14: qty 1

## 2017-02-14 MED ORDER — LIDOCAINE HCL (PF) 4 % IJ SOLN
INTRAMUSCULAR | Status: AC
  Filled 2017-02-14: qty 5

## 2017-02-14 MED ORDER — POVIDONE-IODINE 5 % OP SOLN
OPHTHALMIC | Status: DC | PRN
  Administered 2017-02-14: 1 via OPHTHALMIC

## 2017-02-14 MED ORDER — SODIUM CHLORIDE 0.9 % IV SOLN
INTRAVENOUS | Status: DC
  Administered 2017-02-14: 07:00:00 via INTRAVENOUS

## 2017-02-14 MED ORDER — MOXIFLOXACIN HCL 0.5 % OP SOLN
OPHTHALMIC | Status: DC | PRN
  Administered 2017-02-14: .2 mL via OPHTHALMIC

## 2017-02-14 MED ORDER — ARMC OPHTHALMIC DILATING DROPS
OPHTHALMIC | Status: AC
  Filled 2017-02-14: qty 0.4

## 2017-02-14 MED ORDER — MIDAZOLAM HCL 2 MG/2ML IJ SOLN
INTRAMUSCULAR | Status: AC
  Filled 2017-02-14: qty 2

## 2017-02-14 MED ORDER — MIDAZOLAM HCL 2 MG/2ML IJ SOLN
INTRAMUSCULAR | Status: DC | PRN
  Administered 2017-02-14 (×2): 1 mg via INTRAVENOUS

## 2017-02-14 MED ORDER — MOXIFLOXACIN HCL 0.5 % OP SOLN
OPHTHALMIC | Status: AC
  Filled 2017-02-14: qty 3

## 2017-02-14 MED ORDER — NA CHONDROIT SULF-NA HYALURON 40-17 MG/ML IO SOLN
INTRAOCULAR | Status: DC | PRN
  Administered 2017-02-14: 1 mL via INTRAOCULAR

## 2017-02-14 MED ORDER — EPINEPHRINE PF 1 MG/ML IJ SOLN
INTRAMUSCULAR | Status: DC | PRN
  Administered 2017-02-14: 1 mL via OPHTHALMIC

## 2017-02-14 SURGICAL SUPPLY — 16 items
GLOVE BIO SURGEON STRL SZ8 (GLOVE) ×3 IMPLANT
GLOVE BIOGEL M 6.5 STRL (GLOVE) ×3 IMPLANT
GLOVE SURG LX 8.0 MICRO (GLOVE) ×2
GLOVE SURG LX STRL 8.0 MICRO (GLOVE) ×1 IMPLANT
GOWN STRL REUS W/ TWL LRG LVL3 (GOWN DISPOSABLE) ×2 IMPLANT
GOWN STRL REUS W/TWL LRG LVL3 (GOWN DISPOSABLE) ×6
LABEL CATARACT MEDS ST (LABEL) ×3 IMPLANT
LENS IOL TECNIS ITEC 16.5 (Intraocular Lens) ×2 IMPLANT
PACK CATARACT (MISCELLANEOUS) ×3 IMPLANT
PACK CATARACT BRASINGTON LX (MISCELLANEOUS) ×3 IMPLANT
PACK EYE AFTER SURG (MISCELLANEOUS) ×3 IMPLANT
SOL BSS BAG (MISCELLANEOUS) ×3
SOLUTION BSS BAG (MISCELLANEOUS) ×1 IMPLANT
SYR 5ML LL (SYRINGE) ×3 IMPLANT
WATER STERILE IRR 250ML POUR (IV SOLUTION) ×3 IMPLANT
WIPE NON LINTING 3.25X3.25 (MISCELLANEOUS) ×3 IMPLANT

## 2017-02-14 NOTE — Anesthesia Post-op Follow-up Note (Signed)
Anesthesia QCDR form completed.        

## 2017-02-14 NOTE — Transfer of Care (Signed)
Immediate Anesthesia Transfer of Care Note  Patient: Roger Pruitt  Procedure(s) Performed: CATARACT EXTRACTION PHACO AND INTRAOCULAR LENS PLACEMENT (IOC) (Left Eye)  Patient Location: PACU and Short Stay  Anesthesia Type:MAC  Level of Consciousness: awake, alert  and oriented  Airway & Oxygen Therapy: Patient Spontanous Breathing  Post-op Assessment: Report given to RN and Post -op Vital signs reviewed and stable  Post vital signs: Reviewed and stable  Last Vitals:  Vitals:   02/14/17 0637 02/14/17 0816  BP: 124/79 131/79  Pulse: 60 (!) 59  Resp: 20 18  Temp: 36.6 C   SpO2: 99% 100%    Last Pain:  Vitals:   02/14/17 0637  TempSrc: Temporal         Complications: No apparent anesthesia complications

## 2017-02-14 NOTE — Anesthesia Postprocedure Evaluation (Signed)
Anesthesia Post Note  Patient: Roger Pruitt  Procedure(s) Performed: CATARACT EXTRACTION PHACO AND INTRAOCULAR LENS PLACEMENT ([place]) (Left Eye)  Patient location during evaluation: Short Stay Anesthesia Type: MAC Level of consciousness: awake and alert and oriented Pain management: pain level controlled Vital Signs Assessment: post-procedure vital signs reviewed and stable Respiratory status: spontaneous breathing Cardiovascular status: stable Postop Assessment: no headache, adequate PO intake and no apparent nausea or vomiting Anesthetic complications: no     Last Vitals:  Vitals:   02/14/17 0816 02/14/17 0825  BP: 131/79 120/76  Pulse: (!) 59 (!) 58  Resp: 18   Temp:    SpO2: 100% 100%    Last Pain:  Vitals:   02/14/17 0814  TempSrc: Filbert Berthold

## 2017-02-14 NOTE — H&P (Signed)
All labs reviewed. Abnormal studies sent to patients PCP when indicated.  Previous H&P reviewed, patient examined, there are NO CHANGES.  Roger Pruitt LOUIS11/20/20187:48 AM

## 2017-02-14 NOTE — Anesthesia Preprocedure Evaluation (Signed)
Anesthesia Evaluation  Patient identified by MRN, date of birth, ID band Patient awake    Reviewed: Allergy & Precautions, NPO status , Patient's Chart, lab work & pertinent test results  History of Anesthesia Complications Negative for: history of anesthetic complications  Airway Mallampati: II  TM Distance: >3 FB Neck ROM: Full    Dental  (+) Edentulous Upper, Edentulous Lower   Pulmonary asthma , sleep apnea , former smoker,    breath sounds clear to auscultation- rhonchi (-) wheezing      Cardiovascular hypertension, (-) angina+ CAD, + Past MI and + CABG   Rhythm:Regular Rate:Normal - Systolic murmurs and - Diastolic murmurs    Neuro/Psych PSYCHIATRIC DISORDERS Anxiety Depression Bipolar Disorder CVA    GI/Hepatic Neg liver ROS, GERD  ,  Endo/Other  diabetes, Oral Hypoglycemic Agents  Renal/GU negative Renal ROS     Musculoskeletal  (+) Arthritis ,   Abdominal (+) - obese,   Peds  Hematology  (+) anemia ,   Anesthesia Other Findings Past Medical History: No date: Abdominal pain No date: Acute cystitis without hematuria No date: Anemia No date: Anginal pain (HCC) No date: Arthritis No date: Asthma No date: Bipolar disorder (HCC)     Comment:  BIPOLAR/ SCHIZO/ AFFECTIVE  No date: BPD (bronchopulmonary dysplasia) No date: BPH (benign prostatic hyperplasia) No date: CAD (coronary artery disease)     Comment:  a. s/p MI 2001, 2012;  b. s/p prior LCX stenting; b.               05/2012 Abnl MV; c. 05/2012 Cath: patent LCX stent w/ otw               mod non-obs dzs->Med Rx. No date: Carpal tunnel syndrome No date: Depression No date: Disc degeneration, lumbar No date: Dyspnea     Comment:  WITH EXERTION No date: Elevated PSA No date: Esophageal reflux No date: GERD (gastroesophageal reflux disease) No date: Hematuria     Comment:  history of gross and microscopic No date: Hemorrhoid No date: History of  balanitis No date: History of bladder infections No date: History of echocardiogram     Comment:  a. 07/2014 Echo: EF 60-65%, no rwma, nl LA size, nl               RV/PASP. No date: History of urethral stricture No date: Hyperlipidemia No date: Hypertension No date: Hypertensive heart disease No date: Incomplete bladder emptying No date: Iron deficiency anemia, unspecified No date: Lower urinary tract symptoms No date: Myocardial infarction [facility])     Comment:  X7 LAST 2001 No date: Neuropathy No date: Nocturia No date: Obsessive compulsive disorder No date: Other dysphagia No date: Panic attack No date: Parkinson's disease ([place]) No date: Sleep apnea     Comment:  SURGICAL TX No date: Stroke ([place]) No date: Type II diabetes mellitus ([place]) No date: Unspecified vitamin D deficiency No date: Urethral stricture No date: Urinary retention No date: Varicose vein of leg No date: Vitamin D deficiency   Reproductive/Obstetrics                             Anesthesia Physical Anesthesia Plan  ASA: III  Anesthesia Plan: MAC   Post-op Pain Management:    Induction: Intravenous  PONV Risk Score and Plan: 1 and Midazolam  Airway Management Planned: Natural Airway  Additional Equipment:   Intra-op Plan:   Post-operative Plan:   Informed  Consent: I have reviewed the patients History and Physical, chart, labs and discussed the procedure including the risks, benefits and alternatives for the proposed anesthesia with the patient or authorized representative who has indicated his/her understanding and acceptance.     Plan Discussed with: CRNA and Anesthesiologist  Anesthesia Plan Comments:         Anesthesia Quick Evaluation

## 2017-02-14 NOTE — Discharge Instructions (Signed)
Eye Surgery Discharge Instructions  Expect mild scratchy sensation or mild soreness. DO NOT RUB YOUR EYE!  The day of surgery:  Minimal physical activity, but bed rest is not required  No reading, computer work, or close hand work  No bending, lifting, or straining.  May watch TV  For 24 hours:  No driving, legal decisions, or alcoholic beverages  Safety precautions  Eat anything you prefer: It is better to start with liquids, then soup then solid foods.  _____ Eye patch should be worn until postoperative exam tomorrow.  ____ Solar shield eyeglasses should be worn for comfort in the sunlight/patch while sleeping  Resume all regular medications including aspirin or Coumadin if these were discontinued prior to surgery. You may shower, bathe, shave, or wash your hair. Tylenol may be taken for mild discomfort.  Call your doctor if you experience significant pain, nausea, or vomiting, fever > 101 or other signs of infection. [phone] or [phone] Specific instructions:  Follow-up Information    Birder Robson, MD Follow up.   Specialty:  Ophthalmology Why:  November 21 at 9:35am Contact information: [address] [place] [address] [phone]

## 2017-02-14 NOTE — Anesthesia Postprocedure Evaluation (Signed)
Anesthesia Post Note  Patient: NYSHAWN GOWDY  Procedure(s) Performed: CATARACT EXTRACTION PHACO AND INTRAOCULAR LENS PLACEMENT (IOC) (Left Eye)  Patient location during evaluation: PACU Anesthesia Type: MAC Level of consciousness: awake and alert and oriented Pain management: pain level controlled Vital Signs Assessment: post-procedure vital signs reviewed and stable Respiratory status: spontaneous breathing, nonlabored ventilation and respiratory function stable Cardiovascular status: blood pressure returned to baseline and stable Postop Assessment: no signs of nausea or vomiting Anesthetic complications: no     Last Vitals:  Vitals:   02/14/17 0814 02/14/17 0816  BP: 131/79 131/79  Pulse: (!) 58 (!) 59  Resp: 16 18  Temp: (!) 36.4 C   SpO2: 100% 100%    Last Pain:  Vitals:   02/14/17 0814  TempSrc: Temporal                 Aeric Burnham

## 2017-02-14 NOTE — Op Note (Signed)
PREOPERATIVE DIAGNOSIS:  Nuclear sclerotic cataract of the left eye.   POSTOPERATIVE DIAGNOSIS:  Nuclear sclerotic cataract of the left eye.   OPERATIVE PROCEDURE: Procedure(s): CATARACT EXTRACTION PHACO AND INTRAOCULAR LENS PLACEMENT (IOC)   SURGEON:  Birder Robson, MD.   ANESTHESIA:  Anesthesiologist: Emmie Niemann, MD CRNA: Jonna Clark, CRNA  1.      Managed anesthesia care. 2.     0.23ml of Shugarcaine was instilled following the paracentesis   COMPLICATIONS:  None.   TECHNIQUE:   Stop and chop   DESCRIPTION OF PROCEDURE:  The patient was examined and consented in the preoperative holding area where the aforementioned topical anesthesia was applied to the left eye and then brought back to the Operating Room where the left eye was prepped and draped in the usual sterile ophthalmic fashion and a lid speculum was placed. A paracentesis was created with the side port blade and the anterior chamber was filled with viscoelastic. A near clear corneal incision was performed with the steel keratome. A continuous curvilinear capsulorrhexis was performed with a cystotome followed by the capsulorrhexis forceps. Hydrodissection and hydrodelineation were carried out with BSS on a blunt cannula. The lens was removed in a stop and chop  technique and the remaining cortical material was removed with the irrigation-aspiration handpiece. The capsular bag was inflated with viscoelastic and the Technis ZCB00 lens was placed in the capsular bag without complication. The remaining viscoelastic was removed from the eye with the irrigation-aspiration handpiece. The wounds were hydrated. The anterior chamber was flushed with Miostat and the eye was inflated to physiologic pressure. 0.31ml Vigamox was placed in the anterior chamber. The wounds were found to be water tight. The eye was dressed with Vigamox. The patient was given protective glasses to wear throughout the day and a shield with which to sleep  tonight. The patient was also given drops with which to begin a drop regimen today and will follow-up with me in one day. Implant Name Type Inv. Item Serial No. Manufacturer Lot No. LRB No. Used  LENS IOL DIOP 16.5 - [id number] Intraocular Lens LENS IOL DIOP 16.5 [id number]  Left 1    Procedure(s) with comments: CATARACT EXTRACTION PHACO AND INTRAOCULAR LENS PLACEMENT (IOC) (Left) - [place] 00:42 AP% 16.6 CDE 7.14 Fluid pack lot # [id number]  Electronically signed: Laterria Lasota LOUIS 02/14/2017 8:14 AM

## 2017-02-23 DIAGNOSIS — H2511 Age-related nuclear cataract, right eye: Secondary | ICD-10-CM | POA: Diagnosis not present

## 2017-02-27 ENCOUNTER — Encounter: Payer: Self-pay | Admitting: *Deleted

## 2017-02-28 ENCOUNTER — Encounter
Admission: RE | Disposition: A | Discharge status: Home / Self Care | Payer: Self-pay | Source: Ambulatory Visit | Attending: Ophthalmology

## 2017-02-28 ENCOUNTER — Ambulatory Visit: Payer: Medicare Other | Admitting: Anesthesiology

## 2017-02-28 ENCOUNTER — Ambulatory Visit
Admission: RE | Admit: 2017-02-28 | Discharge: 2017-02-28 | Disposition: A | Discharge status: Home / Self Care | Payer: Medicare Other | Source: Ambulatory Visit | Attending: Ophthalmology | Admitting: Ophthalmology

## 2017-02-28 DIAGNOSIS — Z955 Presence of coronary angioplasty implant and graft: Secondary | ICD-10-CM | POA: Insufficient documentation

## 2017-02-28 DIAGNOSIS — G20 Parkinson's disease: Secondary | ICD-10-CM | POA: Diagnosis not present

## 2017-02-28 DIAGNOSIS — Z7902 Long term (current) use of antithrombotics/antiplatelets: Secondary | ICD-10-CM | POA: Insufficient documentation

## 2017-02-28 DIAGNOSIS — H2511 Age-related nuclear cataract, right eye: Secondary | ICD-10-CM | POA: Insufficient documentation

## 2017-02-28 DIAGNOSIS — Z951 Presence of aortocoronary bypass graft: Secondary | ICD-10-CM | POA: Diagnosis not present

## 2017-02-28 DIAGNOSIS — I119 Hypertensive heart disease without heart failure: Secondary | ICD-10-CM | POA: Diagnosis not present

## 2017-02-28 DIAGNOSIS — F319 Bipolar disorder, unspecified: Secondary | ICD-10-CM | POA: Diagnosis not present

## 2017-02-28 DIAGNOSIS — Z7984 Long term (current) use of oral hypoglycemic drugs: Secondary | ICD-10-CM | POA: Diagnosis not present

## 2017-02-28 DIAGNOSIS — Z8673 Personal history of transient ischemic attack (TIA), and cerebral infarction without residual deficits: Secondary | ICD-10-CM | POA: Diagnosis not present

## 2017-02-28 DIAGNOSIS — Z885 Allergy status to narcotic agent status: Secondary | ICD-10-CM | POA: Insufficient documentation

## 2017-02-28 DIAGNOSIS — E1151 Type 2 diabetes mellitus with diabetic peripheral angiopathy without gangrene: Secondary | ICD-10-CM | POA: Diagnosis not present

## 2017-02-28 DIAGNOSIS — Z79899 Other long term (current) drug therapy: Secondary | ICD-10-CM | POA: Insufficient documentation

## 2017-02-28 DIAGNOSIS — M1990 Unspecified osteoarthritis, unspecified site: Secondary | ICD-10-CM | POA: Diagnosis not present

## 2017-02-28 DIAGNOSIS — I10 Essential (primary) hypertension: Secondary | ICD-10-CM | POA: Diagnosis not present

## 2017-02-28 DIAGNOSIS — K219 Gastro-esophageal reflux disease without esophagitis: Secondary | ICD-10-CM | POA: Insufficient documentation

## 2017-02-28 DIAGNOSIS — J45909 Unspecified asthma, uncomplicated: Secondary | ICD-10-CM | POA: Diagnosis not present

## 2017-02-28 DIAGNOSIS — I252 Old myocardial infarction: Secondary | ICD-10-CM | POA: Insufficient documentation

## 2017-02-28 DIAGNOSIS — Z87891 Personal history of nicotine dependence: Secondary | ICD-10-CM | POA: Diagnosis not present

## 2017-02-28 DIAGNOSIS — F419 Anxiety disorder, unspecified: Secondary | ICD-10-CM | POA: Diagnosis not present

## 2017-02-28 DIAGNOSIS — E119 Type 2 diabetes mellitus without complications: Secondary | ICD-10-CM | POA: Diagnosis not present

## 2017-02-28 DIAGNOSIS — E7800 Pure hypercholesterolemia, unspecified: Secondary | ICD-10-CM | POA: Diagnosis not present

## 2017-02-28 DIAGNOSIS — Z888 Allergy status to other drugs, medicaments and biological substances status: Secondary | ICD-10-CM | POA: Diagnosis not present

## 2017-02-28 DIAGNOSIS — I2510 Atherosclerotic heart disease of native coronary artery without angina pectoris: Secondary | ICD-10-CM | POA: Diagnosis not present

## 2017-02-28 DIAGNOSIS — G4730 Sleep apnea, unspecified: Secondary | ICD-10-CM | POA: Insufficient documentation

## 2017-02-28 HISTORY — PX: CATARACT EXTRACTION W/PHACO: SHX586

## 2017-02-28 LAB — GLUCOSE, CAPILLARY: Glucose-Capillary: 108 mg/dL — ABNORMAL HIGH (ref 65–99)

## 2017-02-28 SURGERY — PHACOEMULSIFICATION, CATARACT, WITH IOL INSERTION
Anesthesia: Monitor Anesthesia Care | Site: Eye | Laterality: Right | Wound class: Clean

## 2017-02-28 MED ORDER — MOXIFLOXACIN HCL 0.5 % OP SOLN
OPHTHALMIC | Status: DC | PRN
  Administered 2017-02-28: .2 mL via OPHTHALMIC

## 2017-02-28 MED ORDER — MOXIFLOXACIN HCL 0.5 % OP SOLN
OPHTHALMIC | Status: AC
  Filled 2017-02-28: qty 3

## 2017-02-28 MED ORDER — EPINEPHRINE PF 1 MG/ML IJ SOLN
INTRAMUSCULAR | Status: AC
Start: 2017-02-28 — End: 2017-02-28
  Filled 2017-02-28: qty 1

## 2017-02-28 MED ORDER — ARMC OPHTHALMIC DILATING DROPS
OPHTHALMIC | Status: AC
  Administered 2017-02-28: 1 via OPHTHALMIC
  Filled 2017-02-28: qty 0.4

## 2017-02-28 MED ORDER — POVIDONE-IODINE 5 % OP SOLN
OPHTHALMIC | Status: DC | PRN
  Administered 2017-02-28: 1 via OPHTHALMIC

## 2017-02-28 MED ORDER — NA CHONDROIT SULF-NA HYALURON 40-17 MG/ML IO SOLN
INTRAOCULAR | Status: AC
  Filled 2017-02-28: qty 1

## 2017-02-28 MED ORDER — LIDOCAINE HCL (PF) 4 % IJ SOLN
INTRAMUSCULAR | Status: AC
  Filled 2017-02-28: qty 5

## 2017-02-28 MED ORDER — FENTANYL CITRATE (PF) 100 MCG/2ML IJ SOLN
INTRAMUSCULAR | Status: DC | PRN
  Administered 2017-02-28: 50 ug via INTRAVENOUS

## 2017-02-28 MED ORDER — CARBACHOL 0.01 % IO SOLN
INTRAOCULAR | Status: DC | PRN
  Administered 2017-02-28: .5 mL via INTRAOCULAR

## 2017-02-28 MED ORDER — MOXIFLOXACIN HCL 0.5 % OP SOLN: 1.0000 [drp] | OPHTHALMIC | Status: DC | PRN

## 2017-02-28 MED ORDER — LIDOCAINE HCL (PF) 4 % IJ SOLN
INTRAMUSCULAR | Status: DC | PRN
  Administered 2017-02-28: 2 mL via OPHTHALMIC

## 2017-02-28 MED ORDER — SODIUM CHLORIDE 0.9 % IV SOLN
INTRAVENOUS | Status: DC
  Administered 2017-02-28: 08:00:00 via INTRAVENOUS

## 2017-02-28 MED ORDER — FENTANYL CITRATE (PF) 100 MCG/2ML IJ SOLN
INTRAMUSCULAR | Status: AC
  Filled 2017-02-28: qty 2

## 2017-02-28 MED ORDER — EPINEPHRINE PF 1 MG/ML IJ SOLN
INTRAOCULAR | Status: DC | PRN
  Administered 2017-02-28: 1 mL via OPHTHALMIC

## 2017-02-28 MED ORDER — NA CHONDROIT SULF-NA HYALURON 40-17 MG/ML IO SOLN
INTRAOCULAR | Status: DC | PRN
  Administered 2017-02-28: 1 mL via INTRAOCULAR

## 2017-02-28 MED ORDER — ARMC OPHTHALMIC DILATING DROPS
1.0000 "application " | OPHTHALMIC | Status: AC
  Administered 2017-02-28 (×3): 1 via OPHTHALMIC

## 2017-02-28 MED ORDER — POVIDONE-IODINE 5 % OP SOLN
OPHTHALMIC | Status: AC
  Filled 2017-02-28: qty 30

## 2017-02-28 MED ORDER — MIDAZOLAM HCL 2 MG/2ML IJ SOLN
INTRAMUSCULAR | Status: AC
  Filled 2017-02-28: qty 2

## 2017-02-28 SURGICAL SUPPLY — 16 items
GLOVE BIO SURGEON STRL SZ8 (GLOVE) ×3 IMPLANT
GLOVE BIOGEL M 6.5 STRL (GLOVE) ×3 IMPLANT
GLOVE SURG LX 8.0 MICRO (GLOVE) ×2
GLOVE SURG LX STRL 8.0 MICRO (GLOVE) ×1 IMPLANT
GOWN STRL REUS W/ TWL LRG LVL3 (GOWN DISPOSABLE) ×2 IMPLANT
GOWN STRL REUS W/TWL LRG LVL3 (GOWN DISPOSABLE) ×6
LABEL CATARACT MEDS ST (LABEL) ×3 IMPLANT
LENS IOL TECNIS ITEC 16.5 (Intraocular Lens) ×2 IMPLANT
PACK CATARACT (MISCELLANEOUS) ×3 IMPLANT
PACK CATARACT BRASINGTON LX (MISCELLANEOUS) ×3 IMPLANT
PACK EYE AFTER SURG (MISCELLANEOUS) ×3 IMPLANT
SOL BSS BAG (MISCELLANEOUS) ×3
SOLUTION BSS BAG (MISCELLANEOUS) ×1 IMPLANT
SYR 5ML LL (SYRINGE) ×3 IMPLANT
WATER STERILE IRR 250ML POUR (IV SOLUTION) ×3 IMPLANT
WIPE NON LINTING 3.25X3.25 (MISCELLANEOUS) ×3 IMPLANT

## 2017-02-28 NOTE — Op Note (Signed)
PREOPERATIVE DIAGNOSIS:  Nuclear sclerotic cataract of the right eye.   POSTOPERATIVE DIAGNOSIS:  nuclear sclerotic cataract right eye   OPERATIVE PROCEDURE: Procedure(s): CATARACT EXTRACTION PHACO AND INTRAOCULAR LENS PLACEMENT (IOC)   SURGEON:  Birder Robson, MD.   ANESTHESIA:  Anesthesiologist: Alvin Critchley, MD CRNA: Johnna Acosta, CRNA; Nelda Marseille, CRNA  1.      Managed anesthesia care. 2.      0.78ml of Shugarcaine was instilled in the eye following the paracentesis.   COMPLICATIONS:  None.   TECHNIQUE:   Stop and chop   DESCRIPTION OF PROCEDURE:  The patient was examined and consented in the preoperative holding area where the aforementioned topical anesthesia was applied to the right eye and then brought back to the Operating Room where the right eye was prepped and draped in the usual sterile ophthalmic fashion and a lid speculum was placed. A paracentesis was created with the side port blade and the anterior chamber was filled with viscoelastic. A near clear corneal incision was performed with the steel keratome. A continuous curvilinear capsulorrhexis was performed with a cystotome followed by the capsulorrhexis forceps. Hydrodissection and hydrodelineation were carried out with BSS on a blunt cannula. The lens was removed in a stop and chop  technique and the remaining cortical material was removed with the irrigation-aspiration handpiece. The capsular bag was inflated with viscoelastic and the Technis ZCB00  lens was placed in the capsular bag without complication. The remaining viscoelastic was removed from the eye with the irrigation-aspiration handpiece. The wounds were hydrated. The anterior chamber was flushed with Miostat and the eye was inflated to physiologic pressure. 0.88ml of Vigamox was placed in the anterior chamber. The wounds were found to be water tight. The eye was dressed with Vigamox. The patient was given protective glasses to wear throughout the day and  a shield with which to sleep tonight. The patient was also given drops with which to begin a drop regimen today and will follow-up with me in one day. * No implants in log * Procedure(s) with comments: CATARACT EXTRACTION PHACO AND INTRAOCULAR LENS PLACEMENT (IOC) (Right) - [place] 01:09 AP% 11.4 CDE 7.98 Fluid pack lot # [id number]  Electronically signed: [place] 02/28/2017 9:48 AM

## 2017-02-28 NOTE — Anesthesia Preprocedure Evaluation (Signed)
Anesthesia Evaluation  Patient identified by MRN, date of birth, ID band Patient awake    Reviewed: Allergy & Precautions, NPO status , Patient's Chart, lab work & pertinent test results  History of Anesthesia Complications Negative for: history of anesthetic complications  Airway Mallampati: II  TM Distance: >3 FB Neck ROM: Full    Dental  (+) Upper Dentures, Lower Dentures   Pulmonary shortness of breath and with exertion, asthma , sleep apnea , former smoker,    breath sounds clear to auscultation- rhonchi (-) wheezing      Cardiovascular hypertension, (-) angina+ CAD, + Past MI, + CABG and + Peripheral Vascular Disease   Rhythm:Regular Rate:Normal - Systolic murmurs and - Diastolic murmurs    Neuro/Psych PSYCHIATRIC DISORDERS Anxiety Depression Bipolar Disorder  Neuromuscular disease CVA    GI/Hepatic Neg liver ROS, GERD  ,  Endo/Other  diabetes, Oral Hypoglycemic Agents  Renal/GU negative Renal ROS     Musculoskeletal  (+) Arthritis ,   Abdominal (+) - obese,   Peds  Hematology  (+) anemia ,   Anesthesia Other Findings Past Medical History: No date: Abdominal pain No date: Acute cystitis without hematuria No date: Anemia No date: Anginal pain (HCC) No date: Arthritis No date: Asthma No date: Bipolar disorder (HCC)     Comment:  BIPOLAR/ SCHIZO/ AFFECTIVE  No date: BPD (bronchopulmonary dysplasia) No date: BPH (benign prostatic hyperplasia) No date: CAD (coronary artery disease)     Comment:  a. s/p MI 2001, 2012;  b. s/p prior LCX stenting; b.               05/2012 Abnl MV; c. 05/2012 Cath: patent LCX stent w/ otw               mod non-obs dzs->Med Rx. No date: Carpal tunnel syndrome No date: Depression No date: Disc degeneration, lumbar No date: Dyspnea     Comment:  WITH EXERTION No date: Elevated PSA No date: Esophageal reflux No date: GERD (gastroesophageal reflux disease) No date: Hematuria   Comment:  history of gross and microscopic No date: Hemorrhoid No date: History of balanitis No date: History of bladder infections No date: History of echocardiogram     Comment:  a. 07/2014 Echo: EF 60-65%, no rwma, nl LA size, nl               RV/PASP. No date: History of urethral stricture No date: Hyperlipidemia No date: Hypertension No date: Hypertensive heart disease No date: Incomplete bladder emptying No date: Iron deficiency anemia, unspecified No date: Lower urinary tract symptoms No date: Myocardial infarction [facility])     Comment:  X7 LAST 2001 No date: Neuropathy No date: Nocturia No date: Obsessive compulsive disorder No date: Other dysphagia No date: Panic attack No date: Parkinson's disease ([place]) No date: Sleep apnea     Comment:  SURGICAL [place] No date: Stroke ([place]) No date: Type II diabetes mellitus ([place]) No date: Unspecified vitamin D deficiency No date: Urethral stricture No date: Urinary retention No date: Varicose vein of leg No date: Vitamin D deficiency   Reproductive/Obstetrics                             Anesthesia Physical  Anesthesia Plan  ASA: IV  Anesthesia Plan: MAC   Post-op Pain Management:    Induction: Intravenous  PONV Risk Score and Plan: 1 and Midazolam  Airway Management Planned: Natural Airway  Additional Equipment:  Intra-op Plan:   Post-operative Plan:   Informed Consent: I have reviewed the patients History and Physical, chart, labs and discussed the procedure including the risks, benefits and alternatives for the proposed anesthesia with the patient or authorized representative who has indicated his/her understanding and acceptance.     Plan Discussed with: CRNA and Anesthesiologist  Anesthesia Plan Comments:         Anesthesia Quick Evaluation

## 2017-02-28 NOTE — Transfer of Care (Signed)
Immediate Anesthesia Transfer of Care Note  Patient: Roger Pruitt  Procedure(s) Performed: CATARACT EXTRACTION PHACO AND INTRAOCULAR LENS PLACEMENT (IOC) (Right Eye)  Patient Location: PACU  Anesthesia Type:MAC  Level of Consciousness: awake, alert  and oriented  Airway & Oxygen Therapy: Patient Spontanous Breathing  Post-op Assessment: Report given to RN and Post -op Vital signs reviewed and stable  Post vital signs: Reviewed and stable  Last Vitals:  Vitals:   02/28/17 0755 02/28/17 0951  BP: 137/79 (!) 148/86  Pulse: (!) 58 65  Resp: 18 16  Temp: (!) 35.8 C 36.6 C  SpO2: 100% 99%    Last Pain:  Vitals:   02/28/17 0951  TempSrc: Oral  PainSc:       Patients Stated Pain Goal: 2 (46/56/81 2751)  Complications: No apparent anesthesia complications

## 2017-02-28 NOTE — Anesthesia Procedure Notes (Signed)
Procedure Name: MAC Date/Time: 02/28/2017 9:25 AM Performed by: Johnna Acosta, CRNA Pre-anesthesia Checklist: Patient identified, Emergency Drugs available, Suction available, Patient being monitored and Timeout performed Oxygen Delivery Method: Nasal cannula

## 2017-02-28 NOTE — H&P (Signed)
All labs reviewed. Abnormal studies sent to patients PCP when indicated.  Previous H&P reviewed, patient examined, there are NO CHANGES.  Roger Mccrory LOUIS12/4/20189:16 AM

## 2017-02-28 NOTE — Discharge Instructions (Signed)
Eye Surgery Discharge Instructions  Expect mild scratchy sensation or mild soreness. DO NOT RUB YOUR EYE!  The day of surgery:  Minimal physical activity, but bed rest is not required  No reading, computer work, or close hand work  No bending, lifting, or straining.  May watch TV  For 24 hours:  No driving, legal decisions, or alcoholic beverages  Safety precautions  Eat anything you prefer: It is better to start with liquids, then soup then solid foods.  _____ Eye patch should be worn until postoperative exam tomorrow.  ____ Solar shield eyeglasses should be worn for comfort in the sunlight/patch while sleeping  Resume all regular medications including aspirin or Coumadin if these were discontinued prior to surgery. You may shower, bathe, shave, or wash your hair. Tylenol may be taken for mild discomfort.  Call your doctor if you experience significant pain, nausea, or vomiting, fever > 101 or other signs of infection. [phone] or [phone] Specific instructions:  Follow-up Information    Birder Robson, MD Follow up on 03/01/2017.   Specialty:  Ophthalmology Why:  9:40 Contact information: [address] [place] [address] [phone]

## 2017-02-28 NOTE — Anesthesia Post-op Follow-up Note (Signed)
Anesthesia QCDR form completed.        

## 2017-02-28 NOTE — Anesthesia Postprocedure Evaluation (Signed)
Anesthesia Post Note  Patient: Roger Pruitt  Procedure(s) Performed: CATARACT EXTRACTION PHACO AND INTRAOCULAR LENS PLACEMENT (IOC) (Right Eye)  Patient location during evaluation: PACU Anesthesia Type: MAC Level of consciousness: awake and alert and oriented Pain management: pain level controlled Vital Signs Assessment: post-procedure vital signs reviewed and stable Respiratory status: spontaneous breathing Cardiovascular status: blood pressure returned to baseline Anesthetic complications: no     Last Vitals:  Vitals:   02/28/17 0951 02/28/17 1004  BP: (!) 148/86 (!) 154/80  Pulse: 65 61  Resp: 16 16  Temp: 36.6 C   SpO2: 99% 99%    Last Pain:  Vitals:   02/28/17 0951  TempSrc: Oral  PainSc:                  Natajah Derderian

## 2017-03-01 ENCOUNTER — Encounter: Payer: Self-pay | Admitting: Ophthalmology

## 2017-03-13 ENCOUNTER — Ambulatory Visit
Admission: EM | Admit: 2017-03-13 | Discharge: 2017-03-13 | Disposition: A | Discharge status: Home / Self Care | Payer: Medicare Other | Attending: Family Medicine | Admitting: Family Medicine

## 2017-03-13 ENCOUNTER — Other Ambulatory Visit: Payer: Self-pay

## 2017-03-13 DIAGNOSIS — R300 Dysuria: Secondary | ICD-10-CM | POA: Diagnosis not present

## 2017-03-13 DIAGNOSIS — J069 Acute upper respiratory infection, unspecified: Secondary | ICD-10-CM | POA: Diagnosis not present

## 2017-03-13 DIAGNOSIS — N390 Urinary tract infection, site not specified: Secondary | ICD-10-CM | POA: Diagnosis not present

## 2017-03-13 LAB — URINALYSIS, COMPLETE (UACMP) WITH MICROSCOPIC
Bilirubin Urine: NEGATIVE
GLUCOSE, UA: NEGATIVE mg/dL
Hgb urine dipstick: NEGATIVE
KETONES UR: NEGATIVE mg/dL
Nitrite: NEGATIVE
PH: 6.5 (ref 5.0–8.0)
Protein, ur: NEGATIVE mg/dL
SPECIFIC GRAVITY, URINE: 1.01 (ref 1.005–1.030)

## 2017-03-13 MED ORDER — CIPROFLOXACIN HCL 500 MG PO TABS: 500.0000 mg | ORAL_TABLET | Freq: Two times a day (BID) | ORAL | 0 refills | Status: DC

## 2017-03-13 MED ORDER — BENZONATATE 200 MG PO CAPS: ORAL_CAPSULE | ORAL | 0 refills | Status: DC

## 2017-03-13 NOTE — Discharge Instructions (Addendum)
Recommend following up with your primary care physician next week

## 2017-03-13 NOTE — ED Triage Notes (Signed)
Patient complains of dysuria x 2 days. Patient states that he has urgency and pain with urinating.  Patient also complains of a "hacking" cough x 2-3 weeks that seems to be worse in the morning.

## 2017-03-13 NOTE — ED Provider Notes (Signed)
[facility]    CSN: [id number] Arrival date & time: 03/13/17  6073     History   Chief Complaint Chief Complaint  Patient presents with  . Dysuria  . Cough    HPI Roger Pruitt is a 66 y.o. male.   HPI  This is a 66 year old male who presents with 2 separate problems.  Patient complains of dysuria with burning frequency and urgency for the last 2 days.  In review of his medical records he has had previous frequent UTIs from urinary stricture and BPH.  Denies any fever or chills.  He also denies any back pain.  No penile discharge.  Denies scrotal pain.  Second problem is that of a cough that he has had for 2-3 weeks.  Seems to be worse in the morning.  It is nonproductive.  He has had no associated shortness of breath with the cough.  He states that he caught the cough from his wife who has had "pneumonia".      Past Medical History:  Diagnosis Date  . Abdominal pain   . Acute cystitis without hematuria   . Anemia   . Anginal pain ([place])   . Arthritis   . Asthma   . Bipolar disorder ([place])    BIPOLAR/ SCHIZO/ AFFECTIVE   . BPD (bronchopulmonary dysplasia)   . BPH (benign prostatic hyperplasia)   . CAD (coronary artery disease)    a. s/p MI 2001, 2012;  b. s/p prior LCX stenting; b. 05/2012 Abnl MV; c. 05/2012 Cath: patent LCX stent w/ otw mod non-obs dzs->Med Rx.  . Carpal tunnel syndrome   . Depression   . Disc degeneration, lumbar   . Dyspnea    WITH EXERTION  . Elevated PSA   . Esophageal reflux   . GERD (gastroesophageal reflux disease)   . Hematuria    history of gross and microscopic  . Hemorrhoid   . History of balanitis   . History of bladder infections   . History of echocardiogram    a. 07/2014 Echo: EF 60-65%, no rwma, nl LA size, nl RV/PASP.  Marland Kitchen History of urethral stricture   . Hyperlipidemia   . Hypertension   . Hypertensive heart disease   . Incomplete bladder emptying   . Iron deficiency anemia, unspecified   . Lower urinary tract  symptoms   . Myocardial infarction ([place])    X7 LAST 2001  . Neuropathy   . Nocturia   . Obsessive compulsive disorder   . Other dysphagia   . Panic attack   . Parkinson's disease ([place])   . Sleep apnea    SURGICAL TX  . Stroke ([place])   . Type II diabetes mellitus ([place])   . Unspecified vitamin D deficiency   . Urethral stricture   . Urinary retention   . Varicose vein of leg   . Vitamin D deficiency     Patient Active Problem List   Diagnosis Date Noted  . Altered mental state 10/19/2016  . Altered mental status 10/18/2016  . Parkinsonism due to drug ([place]) 07/22/2016  . Chronic bilateral low back pain without sciatica 04/07/2016  . Difficulty walking 04/07/2016  . Syncope 12/31/2015  . Status post coronary artery bypass grafting 12/31/2015  . S/P CABG x 4 12/25/2015  . Unstable angina ([place]) 12/01/2015  . Chest tightness or pressure 10/27/2015  . Hypertensive heart disease   . Hyperlipidemia, mixed   . Type II diabetes mellitus ([place])   . UTI (lower  urinary tract infection) 07/20/2015  . Hyponatremia 07/20/2015  . Abnormal chest CT 07/10/2015  . Pulmonary nodules 07/09/2015  . Dyspnea 07/09/2015  . Basal cell carcinoma of skin 05/27/2015  . Bipolar disorder ([place]) 05/27/2015  . CAD (coronary artery disease), native coronary artery 05/27/2015  . DDD (degenerative disc disease), lumbosacral 05/27/2015  . Esophageal reflux 05/27/2015  . Benign essential hypertension 05/27/2015  . Sleep apnea 05/27/2015  . Urethral stricture 05/27/2015  . Benign prostatic hypertrophy (BPH) with incomplete bladder emptying 11/29/2014  . Acute encephalopathy 08/23/2014  . CVA (cerebral infarction) 08/23/2014  . Hypokalemia 08/23/2014  . Leukocytosis 08/23/2014  . Acute CVA (cerebrovascular accident) ([place]) 08/23/2014  . Hypertensive urgency 01/01/2013  . Hypertension   . Chest pain 09/30/2011  . CAD (coronary artery disease) 09/30/2011  . S/P coronary artery stent placement 09/30/2011  .  Hyperlipidemia 09/30/2011  . Type II or unspecified type diabetes mellitus without mention of complication, not stated as uncontrolled 09/30/2011  . History of myocardial infarction 09/03/2011  . Seasonal allergies 09/03/2011    Past Surgical History:  Procedure Laterality Date  . BACK SURGERY     lumbar disc  . BACK SURGERY    . CARDIAC CATHETERIZATION  6/10;2011;Aug.2012;Oct. 2012;   . CARDIAC CATHETERIZATION  05/2012   [facility]; EF 60%, patent stent in the left circumflex, moderate three-vessel disease with no flow-limiting lesions. Unchanged from most recent catheterization  . CARDIAC CATHETERIZATION N/A 12/10/2015   Procedure: Left Heart Cath and Coronary Angiography;  Surgeon: Corey Skains, MD;  Location: [facility];  Service: Cardiovascular;  Laterality: N/A;  . CARPAL TUNNEL RELEASE  2009   left  . CATARACT EXTRACTION W/PHACO Left 02/14/2017   Procedure: CATARACT EXTRACTION PHACO AND INTRAOCULAR LENS PLACEMENT (IOC);  Surgeon: Birder Robson, MD;  Location: [facility];  Service: Ophthalmology;  Laterality: Left;  [place] 00:42 AP% 16.6 CDE 7.14 Fluid pack lot # [id number]  . CATARACT EXTRACTION W/PHACO Right 02/28/2017   Procedure: CATARACT EXTRACTION PHACO AND INTRAOCULAR LENS PLACEMENT (IOC);  Surgeon: Birder Robson, MD;  Location: [facility];  Service: Ophthalmology;  Laterality: Right;  [place] 01:09 AP% 11.4 CDE 7.98 Fluid pack lot # [id number]  . CORONARY ANGIOPLASTY  2011 & 2001   s/p stent  . CORONARY ARTERY BYPASS GRAFT     stent placement  . CORONARY ARTERY BYPASS GRAFT N/A 12/25/2015   Procedure: CORONARY ARTERY BYPASS GRAFTING (CABG) x4  , using left internal mammary artery and right leg greater saphenous vein harvested endoscopically LIMA-LAD SVG-OM2 SVG-DIAG SVG-PD;  Surgeon: Melrose Nakayama, MD;  Location: [facility];  Service: Open Heart Surgery;  Laterality: N/A;  . [place]  . CYST REMOVAL LEG Right   . SKIN GRAFT    . TEE WITHOUT CARDIOVERSION  N/A 12/25/2015   Procedure: TRANSESOPHAGEAL ECHOCARDIOGRAM (TEE);  Surgeon: Melrose Nakayama, MD;  Location: [facility];  Service: Open Heart Surgery;  Laterality: N/A;  . TONSILLECTOMY         Home Medications    Prior to Admission medications   Medication Sig Start Date End Date Taking? Authorizing Provider  acetaminophen (TYLENOL) 500 MG tablet Take 1,000 mg by mouth every 6 (six) hours as needed for moderate pain or headache.    Yes [provider]  amLODipine (NORVASC) 10 MG tablet Take 10 mg by mouth daily.  10/06/16  Yes [provider]  atorvastatin (LIPITOR) 40 MG tablet Take 1 tablet (40 mg total) by mouth daily at 6 PM.  12/30/15  Yes Barrett, Erin R, PA-C  budesonide-formoterol (SYMBICORT) 160-4.5 MCG/ACT inhaler Inhale 2 puffs into the lungs 2 (two) times daily. 09/16/16  Yes Kasa, Maretta Bees, MD  buPROPion (WELLBUTRIN XL) 300 MG 24 hr tablet Take 300 mg by mouth daily.    Yes [provider]  Carbamazepine (EQUETRO) 300 MG CP12 Take 300 mg by mouth 2 (two) times daily.    Yes [provider]  carvedilol (COREG) 12.5 MG tablet Take 12.5 mg by mouth 2 (two) times daily.  09/27/16  Yes [provider]  cefpodoxime (VANTIN) 200 MG tablet Take 1 tablet (200 mg total) by mouth 2 (two) times daily. 10/20/16  Yes Sudini, Alveta Heimlich, MD  clopidogrel (PLAVIX) 75 MG tablet Take 75 mg by mouth daily. 11/25/15  Yes [provider]  doxepin (SINEQUAN) 25 MG capsule Take 50 mg daily by mouth.  08/02/16  Yes [provider]  DUREZOL 0.05 % EMUL Place 1 drop into the left eye at bedtime. 02/03/17  Yes [provider]  fluticasone (FLONASE) 50 MCG/ACT nasal spray Place 2 sprays into both nostrils daily.    Yes [provider]  gabapentin (NEURONTIN) 300 MG capsule Take 300 mg 3 (three) times daily by mouth.  08/02/16  Yes [provider]  ILEVRO 0.3 % ophthalmic suspension Place 1 drop into the left eye at bedtime. 02/06/17  Yes  [provider]  Incontinence Supply Disposable (DEPEND ADJUSTABLE UNDERWEAR LG) MISC Wear pads tid 09/19/16  Yes McGowan, Shannon A, PA-C  Ipratropium-Albuterol (COMBIVENT RESPIMAT) 20-100 MCG/ACT AERS respimat Inhale 2 puffs into the lungs every 6 (six) hours as needed for wheezing or shortness of breath.   Yes [provider]  JANUMET 50-1000 MG tablet Take 1 tablet by mouth 2 (two) times daily. 08/02/16  Yes [provider]  lisinopril (PRINIVIL,ZESTRIL) 20 MG tablet Take 20 mg 2 (two) times daily by mouth.  08/02/16  Yes [provider]  LORazepam (ATIVAN) 1 MG tablet Take 1-2 tablets (1-2 mg total) by mouth 2 (two) times daily. Pt takes one tablet in the morning and two at bedtime. Patient taking differently: Take 1-2 mg by mouth See admin instructions. Take 1 mg by mouth in the morning and take 2 mg by mouth at bedtime 01/04/16  Yes Gladstone Lighter, MD  nitroGLYCERIN (NITROLINGUAL) 0.4 MG/SPRAY spray Place 1 spray under the tongue every 5 (five) minutes x 3 doses as needed for chest pain.   Yes [provider]  pantoprazole (PROTONIX) 40 MG tablet Take 40 mg by mouth daily.    Yes [provider]  polyethylene glycol-electrolytes (TRILYTE) 420 g solution Take 4,000 mLs by mouth as directed. Drink one 8 oz glass every 20 mins until stools are clear 03/29/16  Yes [name], MD  risperidone (RISPERDAL) 4 MG tablet Take 4 mg by mouth at bedtime.  08/02/16  Yes [provider]  tamsulosin (FLOMAX) 0.4 MG CAPS capsule Take 0.4 mg by mouth daily.    Yes [provider]  topiramate (TOPAMAX) 100 MG tablet Take 100 mg by mouth daily.    Yes [provider]  traMADol (ULTRAM) 50 MG tablet Take 50 mg by mouth every 6 (six) hours as needed for moderate pain.  08/08/16  Yes [provider]  benzonatate (TESSALON) 200 MG capsule Take one cap TID PRN cough 03/13/17   Lorin Picket, PA-C  ciprofloxacin (CIPRO) 500 MG  tablet Take 1 tablet (500 mg total) by mouth 2 (two) times  daily. 03/13/17   Lorin Picket, PA-C    Family History Family History  Problem Relation Age of Onset  . Heart disease Mother   . Heart disease Brother   . Prostate cancer Father   . Heart disease Unknown   . Heart disease Brother   . Kidney disease Neg Hx   . Kidney cancer Neg Hx   . Bladder Cancer Neg Hx     Social History Social History   Tobacco Use  . Smoking status: Former Smoker    Packs/day: 0.50    Years: 15.00    Pack years: 7.50    Types: Cigarettes    Last attempt to quit: 05/18/1999    Years since quitting: 17.8  . Smokeless tobacco: Never Used  . Tobacco comment: quit 17 + years  Substance Use Topics  . Alcohol use: No    Alcohol/week: 0.0 oz  . Drug use: No     Allergies   Morphine and related; Synalgos-dc [aspirin-caff-dihydrocodeine]; and Synchlor a [chlorpheniramine]   Review of Systems Review of Systems  Constitutional: Positive for activity change. Negative for chills, fatigue and fever.  HENT: Positive for congestion and postnasal drip.   Respiratory: Positive for cough. Negative for shortness of breath, wheezing and stridor.   Genitourinary: Positive for dysuria, frequency and urgency. Negative for difficulty urinating, discharge, flank pain, genital sores, hematuria, penile pain, penile swelling, scrotal swelling and testicular pain.  All other systems reviewed and are negative.    Physical Exam Triage Vital Signs ED Triage Vitals  Enc Vitals Group     BP 03/13/17 1039 127/77     Pulse Rate 03/13/17 1039 60     Resp 03/13/17 1039 18     Temp 03/13/17 1039 (!) 97.5 F (36.4 C)     Temp Source 03/13/17 1039 Oral     SpO2 03/13/17 1039 100 %     Weight 03/13/17 1036 204 lb (92.5 kg)     Height 03/13/17 1036 6\' 3"  (1.905 m)     Head Circumference --      Peak Flow --      Pain Score 03/13/17 1036 5     Pain Loc --      Pain Edu? --      Excl. in [place]? --    No data  found.  Updated Vital Signs BP 127/77 (BP Location: Left Arm)   Pulse 60   Temp (!) 97.5 F (36.4 C) (Oral)   Resp 18   Ht 6\' 3"  (1.905 m)   Wt 204 lb (92.5 kg)   SpO2 100%   BMI 25.50 kg/m   Visual Acuity Right Eye Distance:   Left Eye Distance:   Bilateral Distance:    Right Eye Near:   Left Eye Near:    Bilateral Near:     Physical Exam  Constitutional: He is oriented to person, place, and time. He appears well-developed and well-nourished. No distress.  HENT:  Head: Normocephalic.  Nose: Nose normal.  Mouth/Throat: Oropharynx is clear and moist. No oropharyngeal exudate.  Both ear canals are occluded with cerumen  Eyes: Pupils are equal, round, and reactive to light. Right eye exhibits no discharge. Left eye exhibits no discharge.  Neck: Normal range of motion.  Pulmonary/Chest: Effort normal and breath sounds normal.  Musculoskeletal: Normal range of motion.  Lymphadenopathy:    He has no cervical adenopathy.  Neurological: He is alert and oriented to person, place, and time.  Skin: Skin is warm  and dry. He is not diaphoretic.  Psychiatric: He has a normal mood and affect. His behavior is normal. Judgment and thought content normal.  Nursing note and vitals reviewed.    UC Treatments / Results  Labs (all labs ordered are listed, but only abnormal results are displayed) Labs Reviewed  URINALYSIS, COMPLETE (UACMP) WITH MICROSCOPIC - Abnormal; Notable for the following components:      Result Value   APPearance HAZY (*)    Leukocytes, UA MODERATE (*)    Squamous Epithelial / LPF 0-5 (*)    Bacteria, UA FEW (*)    All other components within normal limits  URINE CULTURE    EKG  EKG Interpretation None       Radiology No results found.  Procedures Procedures (including critical care time)  Medications Ordered in UC Medications - No data to display   Initial Impression / Assessment and Plan / UC Course  I have reviewed the triage vital signs  and the nursing notes.  Pertinent labs & imaging results that were available during my care of the patient were reviewed by me and considered in my medical decision making (see chart for details).     Plan: 1. Test/x-ray results and diagnosis reviewed with patient 2. rx as per orders; risks, benefits, potential side effects reviewed with patient 3. Recommend supportive treatment with increased fluids and rest as necessary.  Patient is allergic to sulfa drugs.  He has also had recurrent UTIs and history of urinary strictures as well as BPH.  Because of this I consider this a complicated urinary tract infection.  According to epocrates, recommendations are for flora quinolone to treat the UTIs in men despite FDA warnings of antibiotic resistance.  Therefore I will initiate with Cipro awaiting urine cultures which will be available in 2 days.  I have recommended to the patient that he follow-up with his primary care physician next week particularly if he is not improving.  For his upper respiratory infection I will provide him with Tessalon Perles.  He is allergic to chlorpheniramine would not  use Tussionex.  He has states that most of his cough is in the morning and this would be better handled with the [employer] 4. F/u prn if symptoms worsen or don't improve   Final Clinical Impressions(s) / UC Diagnoses   Final diagnoses:  Upper respiratory tract infection, unspecified type  Lower urinary tract infectious disease    ED Discharge Orders        Ordered    ciprofloxacin (CIPRO) 500 MG tablet  2 times daily     03/13/17 1109    benzonatate (TESSALON) 200 MG capsule     03/13/17 1109       Controlled Substance Prescriptions [place] Controlled Substance Registry consulted? Not Applicable   Lorin Picket, PA-C 03/13/17 1129

## 2017-03-14 ENCOUNTER — Ambulatory Visit (INDEPENDENT_AMBULATORY_CARE_PROVIDER_SITE_OTHER): Payer: Medicare Other | Admitting: Orthotics

## 2017-03-14 DIAGNOSIS — M216X2 Other acquired deformities of left foot: Secondary | ICD-10-CM

## 2017-03-14 DIAGNOSIS — E0842 Diabetes mellitus due to underlying condition with diabetic polyneuropathy: Secondary | ICD-10-CM

## 2017-03-14 DIAGNOSIS — E1100 Type 2 diabetes mellitus with hyperosmolarity without nonketotic hyperglycemic-hyperosmolar coma (NKHHC): Secondary | ICD-10-CM

## 2017-03-14 DIAGNOSIS — M216X1 Other acquired deformities of right foot: Secondary | ICD-10-CM

## 2017-03-14 DIAGNOSIS — E1149 Type 2 diabetes mellitus with other diabetic neurological complication: Secondary | ICD-10-CM

## 2017-03-14 NOTE — Progress Notes (Signed)

## 2017-03-15 ENCOUNTER — Ambulatory Visit
Admission: RE | Admit: 2017-03-15 | Discharge: 2017-03-15 | Disposition: A | Discharge status: Home / Self Care | Payer: Medicare Other | Source: Ambulatory Visit | Attending: Internal Medicine | Admitting: Internal Medicine

## 2017-03-15 ENCOUNTER — Ambulatory Visit: Payer: Medicare Other

## 2017-03-15 DIAGNOSIS — I700 Atherosclerosis of aorta: Secondary | ICD-10-CM | POA: Insufficient documentation

## 2017-03-15 DIAGNOSIS — R918 Other nonspecific abnormal finding of lung field: Secondary | ICD-10-CM | POA: Diagnosis not present

## 2017-03-15 DIAGNOSIS — Z951 Presence of aortocoronary bypass graft: Secondary | ICD-10-CM | POA: Insufficient documentation

## 2017-03-15 DIAGNOSIS — I2510 Atherosclerotic heart disease of native coronary artery without angina pectoris: Secondary | ICD-10-CM | POA: Diagnosis not present

## 2017-03-15 DIAGNOSIS — R0602 Shortness of breath: Secondary | ICD-10-CM | POA: Diagnosis not present

## 2017-03-15 DIAGNOSIS — R911 Solitary pulmonary nodule: Secondary | ICD-10-CM | POA: Diagnosis not present

## 2017-03-17 ENCOUNTER — Ambulatory Visit: Payer: Medicare Other | Admitting: Pulmonary Disease

## 2017-03-30 ENCOUNTER — Encounter: Payer: Self-pay | Admitting: Anesthesiology

## 2017-03-30 ENCOUNTER — Encounter: Payer: Self-pay | Admitting: *Deleted

## 2017-03-30 ENCOUNTER — Other Ambulatory Visit: Payer: Self-pay

## 2017-03-30 MED ORDER — NA SULFATE-K SULFATE-MG SULF 17.5-3.13-1.6 GM/177ML PO SOLN: 1.0000 | ORAL | 0 refills | Status: DC

## 2017-04-06 ENCOUNTER — Ambulatory Visit: Admission: RE | Admit: 2017-04-06 | Payer: Medicare Other | Source: Ambulatory Visit | Admitting: Gastroenterology

## 2017-04-06 SURGERY — COLONOSCOPY WITH PROPOFOL
Anesthesia: Choice

## 2017-04-08 ENCOUNTER — Ambulatory Visit
Admission: EM | Admit: 2017-04-08 | Discharge: 2017-04-08 | Disposition: A | Discharge status: Home / Self Care | Payer: Medicare Other | Attending: Family Medicine | Admitting: Family Medicine

## 2017-04-08 ENCOUNTER — Encounter: Payer: Self-pay | Admitting: Gynecology

## 2017-04-08 ENCOUNTER — Other Ambulatory Visit: Payer: Self-pay

## 2017-04-08 DIAGNOSIS — E785 Hyperlipidemia, unspecified: Secondary | ICD-10-CM | POA: Insufficient documentation

## 2017-04-08 DIAGNOSIS — Z8673 Personal history of transient ischemic attack (TIA), and cerebral infarction without residual deficits: Secondary | ICD-10-CM | POA: Diagnosis not present

## 2017-04-08 DIAGNOSIS — G4730 Sleep apnea, unspecified: Secondary | ICD-10-CM | POA: Diagnosis not present

## 2017-04-08 DIAGNOSIS — R300 Dysuria: Secondary | ICD-10-CM | POA: Diagnosis not present

## 2017-04-08 DIAGNOSIS — F410 Panic disorder [episodic paroxysmal anxiety] without agoraphobia: Secondary | ICD-10-CM | POA: Diagnosis not present

## 2017-04-08 DIAGNOSIS — M5137 Other intervertebral disc degeneration, lumbosacral region: Secondary | ICD-10-CM | POA: Diagnosis not present

## 2017-04-08 DIAGNOSIS — I119 Hypertensive heart disease without heart failure: Secondary | ICD-10-CM | POA: Insufficient documentation

## 2017-04-08 DIAGNOSIS — K219 Gastro-esophageal reflux disease without esophagitis: Secondary | ICD-10-CM | POA: Diagnosis not present

## 2017-04-08 DIAGNOSIS — R109 Unspecified abdominal pain: Secondary | ICD-10-CM | POA: Diagnosis not present

## 2017-04-08 DIAGNOSIS — G5600 Carpal tunnel syndrome, unspecified upper limb: Secondary | ICD-10-CM | POA: Diagnosis not present

## 2017-04-08 DIAGNOSIS — J45909 Unspecified asthma, uncomplicated: Secondary | ICD-10-CM | POA: Insufficient documentation

## 2017-04-08 DIAGNOSIS — N35919 Unspecified urethral stricture, male, unspecified site: Secondary | ICD-10-CM | POA: Diagnosis not present

## 2017-04-08 DIAGNOSIS — E559 Vitamin D deficiency, unspecified: Secondary | ICD-10-CM | POA: Insufficient documentation

## 2017-04-08 DIAGNOSIS — E1140 Type 2 diabetes mellitus with diabetic neuropathy, unspecified: Secondary | ICD-10-CM | POA: Insufficient documentation

## 2017-04-08 DIAGNOSIS — F429 Obsessive-compulsive disorder, unspecified: Secondary | ICD-10-CM | POA: Diagnosis not present

## 2017-04-08 DIAGNOSIS — R918 Other nonspecific abnormal finding of lung field: Secondary | ICD-10-CM | POA: Insufficient documentation

## 2017-04-08 DIAGNOSIS — Z951 Presence of aortocoronary bypass graft: Secondary | ICD-10-CM | POA: Insufficient documentation

## 2017-04-08 DIAGNOSIS — Z79899 Other long term (current) drug therapy: Secondary | ICD-10-CM | POA: Insufficient documentation

## 2017-04-08 DIAGNOSIS — F319 Bipolar disorder, unspecified: Secondary | ICD-10-CM | POA: Insufficient documentation

## 2017-04-08 DIAGNOSIS — I252 Old myocardial infarction: Secondary | ICD-10-CM | POA: Insufficient documentation

## 2017-04-08 DIAGNOSIS — G8929 Other chronic pain: Secondary | ICD-10-CM | POA: Insufficient documentation

## 2017-04-08 DIAGNOSIS — R82998 Other abnormal findings in urine: Secondary | ICD-10-CM | POA: Diagnosis not present

## 2017-04-08 DIAGNOSIS — G20 Parkinson's disease: Secondary | ICD-10-CM | POA: Diagnosis not present

## 2017-04-08 DIAGNOSIS — N3001 Acute cystitis with hematuria: Secondary | ICD-10-CM | POA: Diagnosis not present

## 2017-04-08 DIAGNOSIS — Z87891 Personal history of nicotine dependence: Secondary | ICD-10-CM | POA: Insufficient documentation

## 2017-04-08 DIAGNOSIS — I25110 Atherosclerotic heart disease of native coronary artery with unstable angina pectoris: Secondary | ICD-10-CM | POA: Insufficient documentation

## 2017-04-08 LAB — URINALYSIS, COMPLETE (UACMP) WITH MICROSCOPIC
BILIRUBIN URINE: NEGATIVE
Glucose, UA: NEGATIVE mg/dL
Ketones, ur: NEGATIVE mg/dL
NITRITE: NEGATIVE
PROTEIN: 100 mg/dL — AB
SQUAMOUS EPITHELIAL / LPF: NONE SEEN
Specific Gravity, Urine: 1.015 (ref 1.005–1.030)
pH: 6 (ref 5.0–8.0)

## 2017-04-08 MED ORDER — CEPHALEXIN 500 MG PO CAPS: 500.0000 mg | ORAL_CAPSULE | Freq: Two times a day (BID) | ORAL | 0 refills | Status: DC

## 2017-04-08 NOTE — ED Provider Notes (Signed)
[facility]    CSN: [id number] Arrival date & time: 04/08/17  7591  History   Chief Complaint Chief Complaint  Patient presents with  . Urinary Tract Infection   HPI  67 year old male with an extensive past medical history including BPH and recurrent UTI presents with concerns for urinary tract infection.  Patient states that he developed burning with urination and malodorous urine yesterday.  He states that he had an elevated temperature last night of 100.1.  No flank pain.  When asked about abdominal pain, he reports that he has had some abdominal pain.  I reviewed the notes from urology, he is apparently supposed to intermittently cath 3 times a day.  He is not doing so.  This is potentially the source for recurrent UTIs as he is not emptying properly.  No nausea vomiting.  No other associated symptoms.  No other complaints or concerns at this time.  Past Medical History:  Diagnosis Date  . Abdominal pain   . Acute cystitis without hematuria   . Anemia   . Anginal pain ([place])   . Arthritis   . Asthma   . Bipolar disorder ([place])    BIPOLAR/ SCHIZO/ AFFECTIVE   . BPD (bronchopulmonary dysplasia)   . BPH (benign prostatic hyperplasia)   . CAD (coronary artery disease)    a. s/p MI 2001, 2012;  b. s/p prior LCX stenting; b. 05/2012 Abnl MV; c. 05/2012 Cath: patent LCX stent w/ otw mod non-obs dzs->Med Rx.  . Carpal tunnel syndrome   . Depression   . Disc degeneration, lumbar   . Dyspnea    WITH EXERTION  . Elevated PSA   . Esophageal reflux   . GERD (gastroesophageal reflux disease)   . Hematuria    history of gross and microscopic  . Hemorrhoid   . History of balanitis   . History of bladder infections   . History of echocardiogram    a. 07/2014 Echo: EF 60-65%, no rwma, nl LA size, nl RV/PASP.  Marland Kitchen History of urethral stricture   . Hyperlipidemia   . Hypertension   . Hypertensive heart disease   . Incomplete bladder emptying   . Iron deficiency anemia,  unspecified   . Lower urinary tract symptoms   . Myocardial infarction ([place])    X7 LAST 2001  . Neuropathy   . Nocturia   . Obsessive compulsive disorder   . Other dysphagia   . Panic attack   . Parkinson's disease ([place])   . Sleep apnea    SURGICAL TX  . Stroke ([place])   . Type II diabetes mellitus ([place])   . Unspecified vitamin D deficiency   . Urethral stricture   . Urinary retention   . Varicose vein of leg   . Vitamin D deficiency     Patient Active Problem List   Diagnosis Date Noted  . Parkinsonism due to drug ([place]) 07/22/2016  . Chronic bilateral low back pain without sciatica 04/07/2016  . Syncope 12/31/2015  . Status post coronary artery bypass grafting 12/31/2015  . S/P CABG x 4 12/25/2015  . Unstable angina ([place]) 12/01/2015  . Hypertensive heart disease   . Type II diabetes mellitus ([place])   . Abnormal chest CT 07/10/2015  . Pulmonary nodules 07/09/2015  . Basal cell carcinoma of skin 05/27/2015  . Bipolar disorder ([place]) 05/27/2015  . CAD (coronary artery disease), native coronary artery 05/27/2015  . DDD (degenerative disc disease), lumbosacral 05/27/2015  . Esophageal reflux 05/27/2015  .  Benign essential hypertension 05/27/2015  . Sleep apnea 05/27/2015  . Urethral stricture 05/27/2015  . Benign prostatic hypertrophy (BPH) with incomplete bladder emptying 11/29/2014  . CVA (cerebral infarction) 08/23/2014  . Hypertension   . S/P coronary artery stent placement 09/30/2011  . Hyperlipidemia 09/30/2011  . History of myocardial infarction 09/03/2011  . Seasonal allergies 09/03/2011    Past Surgical History:  Procedure Laterality Date  . BACK SURGERY     lumbar disc  . BACK SURGERY    . CARDIAC CATHETERIZATION  6/10;2011;Aug.2012;Oct. 2012;   . CARDIAC CATHETERIZATION  05/2012   [facility]; EF 60%, patent stent in the left circumflex, moderate three-vessel disease with no flow-limiting lesions. Unchanged from most recent catheterization  . CARDIAC  CATHETERIZATION N/A 12/10/2015   Procedure: Left Heart Cath and Coronary Angiography;  Surgeon: Corey Skains, MD;  Location: [facility];  Service: Cardiovascular;  Laterality: N/A;  . CARPAL TUNNEL RELEASE  2009   left  . CATARACT EXTRACTION W/PHACO Left 02/14/2017   Procedure: CATARACT EXTRACTION PHACO AND INTRAOCULAR LENS PLACEMENT (IOC);  Surgeon: Birder Robson, MD;  Location: [facility];  Service: Ophthalmology;  Laterality: Left;  [place] 00:42 AP% 16.6 CDE 7.14 Fluid pack lot # [id number]  . CATARACT EXTRACTION W/PHACO Right 02/28/2017   Procedure: CATARACT EXTRACTION PHACO AND INTRAOCULAR LENS PLACEMENT (IOC);  Surgeon: Birder Robson, MD;  Location: [facility];  Service: Ophthalmology;  Laterality: Right;  [place] 01:09 AP% 11.4 CDE 7.98 Fluid pack lot # [id number]  . CORONARY ANGIOPLASTY  2011 & 2001   s/p stent  . CORONARY ARTERY BYPASS GRAFT     stent placement  . CORONARY ARTERY BYPASS GRAFT N/A 12/25/2015   Procedure: CORONARY ARTERY BYPASS GRAFTING (CABG) x4  , using left internal mammary artery and right leg greater saphenous vein harvested endoscopically LIMA-LAD SVG-OM2 SVG-DIAG SVG-PD;  Surgeon: Melrose Nakayama, MD;  Location: [facility];  Service: Open Heart Surgery;  Laterality: N/A;  . [place]  . CYST REMOVAL LEG Right   . SKIN GRAFT    . TEE WITHOUT CARDIOVERSION N/A 12/25/2015   Procedure: TRANSESOPHAGEAL ECHOCARDIOGRAM (TEE);  Surgeon: Melrose Nakayama, MD;  Location: [facility];  Service: Open Heart Surgery;  Laterality: N/A;  . TONSILLECTOMY         Home Medications    Prior to Admission medications   Medication Sig Start Date End Date Taking? Authorizing Provider  acetaminophen (TYLENOL) 500 MG tablet Take 1,000 mg by mouth every 6 (six) hours as needed for moderate pain or headache.    Yes [provider]  amLODipine (NORVASC) 10 MG tablet Take 10 mg by mouth daily.  10/06/16  Yes [provider]  atorvastatin (LIPITOR) 40  MG tablet Take 1 tablet (40 mg total) by mouth daily at 6 PM. 12/30/15  Yes Barrett, Erin R, PA-C  budesonide-formoterol (SYMBICORT) 160-4.5 MCG/ACT inhaler Inhale 2 puffs into the lungs 2 (two) times daily. 09/16/16  Yes Kasa, Maretta Bees, MD  buPROPion (WELLBUTRIN XL) 300 MG 24 hr tablet Take 300 mg by mouth daily.    Yes [provider]  Carbamazepine (EQUETRO) 300 MG CP12 Take 300 mg by mouth 2 (two) times daily.    Yes [provider]  clopidogrel (PLAVIX) 75 MG tablet Take 75 mg by mouth daily. 11/25/15  Yes [provider]  doxepin (SINEQUAN) 25 MG capsule Take 50 mg daily by mouth.  08/02/16  Yes [provider]  DUREZOL 0.05 % EMUL Place 1 drop  into the left eye at bedtime. 02/03/17  Yes [provider]  fluticasone (FLONASE) 50 MCG/ACT nasal spray Place 2 sprays into both nostrils daily.    Yes [provider]  gabapentin (NEURONTIN) 300 MG capsule Take 300 mg 3 (three) times daily by mouth.  08/02/16  Yes [provider]  ILEVRO 0.3 % ophthalmic suspension Place 1 drop into the left eye at bedtime. 02/06/17  Yes [provider]  Incontinence Supply Disposable (DEPEND ADJUSTABLE UNDERWEAR LG) MISC Wear pads tid 09/19/16  Yes McGowan, Shannon A, PA-C  Ipratropium-Albuterol (COMBIVENT RESPIMAT) 20-100 MCG/ACT AERS respimat Inhale 2 puffs into the lungs every 6 (six) hours as needed for wheezing or shortness of breath.   Yes [provider]  JANUMET 50-1000 MG tablet Take 1 tablet by mouth 2 (two) times daily. 08/02/16  Yes [provider]  lisinopril (PRINIVIL,ZESTRIL) 20 MG tablet Take 20 mg 2 (two) times daily by mouth.  08/02/16  Yes [provider]  LORazepam (ATIVAN) 1 MG tablet Take 1-2 tablets (1-2 mg total) by mouth 2 (two) times daily. Pt takes one tablet in the morning and two at bedtime. Patient taking differently: Take 1-2 mg by mouth See admin instructions. Take 1 mg by mouth in the morning and take 2  mg by mouth at bedtime 01/04/16  Yes Gladstone Lighter, MD  Na Sulfate-K Sulfate-Mg Sulf (SUPREP BOWEL PREP KIT) 17.5-3.13-1.6 GM/177ML SOLN Take 1 kit by mouth as directed. 03/30/17  Yes [name], MD  nitroGLYCERIN (NITROLINGUAL) 0.4 MG/SPRAY spray Place 1 spray under the tongue every 5 (five) minutes x 3 doses as needed for chest pain.   Yes [provider]  pantoprazole (PROTONIX) 40 MG tablet Take 40 mg by mouth daily.    Yes [provider]  risperidone (RISPERDAL) 4 MG tablet Take 4 mg by mouth at bedtime.  08/02/16  Yes [provider]  tamsulosin (FLOMAX) 0.4 MG CAPS capsule Take 0.4 mg by mouth daily.    Yes [provider]  topiramate (TOPAMAX) 100 MG tablet Take 100 mg by mouth daily.    Yes [provider]  traMADol (ULTRAM) 50 MG tablet Take 50 mg by mouth every 6 (six) hours as needed for moderate pain.  08/08/16  Yes [provider]  carvedilol (COREG) 12.5 MG tablet Take 12.5 mg by mouth 2 (two) times daily.  09/27/16   [provider]  cephALEXin (KEFLEX) 500 MG capsule Take 1 capsule (500 mg total) by mouth 2 (two) times daily. 04/08/17   Coral Spikes, DO    Family History Family History  Problem Relation Age of Onset  . Heart disease Mother   . Heart disease Brother   . Prostate cancer Father   . Heart disease Unknown   . Heart disease Brother   . Kidney disease Neg Hx   . Kidney cancer Neg Hx   . Bladder Cancer Neg Hx     Social History Social History   Tobacco Use  . Smoking status: Former Smoker    Packs/day: 0.50    Years: 15.00    Pack years: 7.50    Types: Cigarettes    Last attempt to quit: 05/18/1999    Years since quitting: 17.9  . Smokeless tobacco: Never Used  . Tobacco comment: quit 17 + years  Substance Use Topics  . Alcohol use: No    Alcohol/week: 0.0 oz  . Drug use: No     Allergies   Morphine and related;  Synalgos-dc [aspirin-caff-dihydrocodeine]; and Synchlor a  [chlorpheniramine]   Review of Systems Review of Systems  Gastrointestinal: Positive for abdominal pain.  Genitourinary: Positive for dysuria.       Malodorous urine.   Physical Exam Triage Vital Signs ED Triage Vitals  Enc Vitals Group     BP 04/08/17 0948 113/76     Pulse Rate 04/08/17 0948 70     Resp 04/08/17 0948 16     Temp 04/08/17 0948 97.9 F (36.6 C)     Temp src --      SpO2 04/08/17 0948 99 %     Weight 04/08/17 0946 204 lb (92.5 kg)     Height --      Head Circumference --      Peak Flow --      Pain Score 04/08/17 0946 5     Pain Loc --      Pain Edu? --      Excl. in [facility]? --    Updated Vital Signs BP 113/76 (BP Location: Left Arm)   Pulse 70   Temp 97.9 F (36.6 C)   Resp 16   Wt 204 lb (92.5 kg)   SpO2 99%   BMI 25.50 kg/m   Physical Exam  Constitutional: He appears well-developed. No distress.  Cardiovascular: Normal rate and regular rhythm.  Pulmonary/Chest: Effort normal and breath sounds normal. He has no wheezes. He has no rales.  Abdominal: Soft. He exhibits no distension.  Mild suprapubic tenderness.  No CVA tenderness.  Neurological: He is alert.  Psychiatric: He has a normal mood and affect.  Nursing note and vitals reviewed.  UC Treatments / Results  Labs (all labs ordered are listed, but only abnormal results are displayed) Labs Reviewed  URINALYSIS, COMPLETE (UACMP) WITH MICROSCOPIC - Abnormal; Notable for the following components:      Result Value   APPearance CLOUDY (*)    Hgb urine dipstick MODERATE (*)    Protein, ur 100 (*)    Leukocytes, UA MODERATE (*)    Bacteria, UA MANY (*)    All other components within normal limits  URINE CULTURE    EKG  EKG Interpretation None       Radiology No results found.  Procedures Procedures (including critical care time)  Medications Ordered in UC Medications - No data to display   Initial Impression / Assessment and Plan / UC Course  I have reviewed the triage vital  signs and the nursing notes.  Pertinent labs & imaging results that were available during my care of the patient were reviewed by me and considered in my medical decision making (see chart for details).    68 year old male presents with signs and symptoms of UTI.  I advised the patient to follow his instructions per urology and intermittently catheterize times daily.  I am treating him with Keflex (based of most recent culture results) while awaiting culture.  Final Clinical Impressions(s) / UC Diagnoses   Final diagnoses:  Acute cystitis with hematuria    ED Discharge Orders        Ordered    cephALEXin (KEFLEX) 500 MG capsule  2 times daily     04/08/17 1028     Controlled Substance Prescriptions [place] Controlled Substance Registry consulted? Not Applicable   Coral Spikes, DO 04/08/17 1041

## 2017-04-08 NOTE — ED Triage Notes (Signed)
Patient c/o of dysuria x 1 day. Per patient burning with urination and strong odor.

## 2017-04-08 NOTE — Discharge Instructions (Signed)
Antibiotic as prescribed.  If you worsen, go straight to the hospital. Take care  Dr. Lacinda Axon

## 2017-04-10 ENCOUNTER — Ambulatory Visit (INDEPENDENT_AMBULATORY_CARE_PROVIDER_SITE_OTHER): Payer: Medicare Other | Admitting: Internal Medicine

## 2017-04-10 ENCOUNTER — Encounter: Payer: Self-pay | Admitting: Internal Medicine

## 2017-04-10 VITALS — BP 112/68 | HR 61 | Ht 75.0 in | Wt 203.0 lb

## 2017-04-10 DIAGNOSIS — J449 Chronic obstructive pulmonary disease, unspecified: Secondary | ICD-10-CM | POA: Diagnosis not present

## 2017-04-10 MED ORDER — GUAIFENESIN-CODEINE 100-10 MG/5ML PO SOLN: 5.0000 mL | ORAL | 0 refills | Status: DC | PRN

## 2017-04-10 NOTE — Patient Instructions (Signed)
Guaiphenesin with Codeine Continue Symbicort as prescribed

## 2017-04-10 NOTE — Progress Notes (Signed)
[facility] Consultation      MRN# [id number] Roger Pruitt [birth year]   CC: Chief Complaint  Patient presents with  . Follow-up    CT results: SOB at all times: pain in both lung areas: dry cough      Brief History: 67 yo male former smoker seen for Multiple pulmonary nodules.    Events since last clinic visit: Patient has chronic cough. And chronic shortness of breath and dyspnea on exertion  had quadruple coronary artery bypass surgery.  No fever, no chills, no sweats. Cough is dry with no significant nasal drainage. Had a CT chest for pulmonary nodules done on 12/1 Recent CT chest shows stable nodules and have been stable since 09/2015  No signs of infection at this time No signs of acute heart failure at this time  Previous assessment of his dyspnea on exertion was related to deconditioned state  Prior PFTs show scooping of the expiratory limb suggestive of obstruction but normal ratio and normal FEV1      Review of Systems  Constitutional: Negative for chills and fever.  Eyes: Negative for blurred vision.  Respiratory: Positive for cough and shortness of breath. Negative for hemoptysis, sputum production and wheezing.   Cardiovascular: Positive for orthopnea. Negative for chest pain and palpitations.  Neurological: Negative for dizziness and headaches.  Endo/Heme/Allergies: Does not bruise/bleed easily.  Psychiatric/Behavioral: Negative for depression and substance abuse.      Allergies:  Morphine and related; Synalgos-dc [aspirin-caff-dihydrocodeine]; and Synchlor a [chlorpheniramine]  Physical Examination:  VS: BP 112/68 (BP Location: Left Arm, Cuff Size: Normal)   Pulse 61   Ht 6\' 3"  (1.905 m)   Wt 203 lb (92.1 kg)   SpO2 98%   BMI 25.37 kg/m   General Appearance: No distress  HEENT: PERRLA, no ptosis, no other lesions noticed Pulmonary:normal breath sounds., diaphragmatic excursion normal.No wheezing, No rales     Cardiovascular:  Normal S1,S2.  No m/r/g.     Abdomen:Exam: Benign, Soft, non-tender, No masses  Skin:   warm, no rashes, no ecchymosis  Extremities: normal, no cyanosis, clubbing, warm with normal capillary refill.       CT Chest 02/2018 I have Independently reviewed images of  CT chest  on 04/10/2017 Interpretation: Stable small solid appearing pulmonary nodules. The largest measures 6 mm. Stable since 2017  Assessment and Plan:   67 year old male with multiple bilateral benign subcentimeter pulmonary nodules seen for follow-up visit in the setting of chronic shortness of breath and chronic dyspnea and exertion in the setting of deconditioned state  Pulmonary nodules Repeat CT chest at last OV showed stable pulmonary nodules, the left lower lobe dominant nodule is now 6 mm. I believe at this point in the majority is nodules are benign reactive nodules, he has had adequate follow-up,   Plan: -Resolution of some pulmonary nodules, dominant left lower lobe nodule 6 mm and stable in size No more CT chests needed, stable nodules since 09/2015   Dyspnea likely related to deconditioned state Stable dyspnea on exertion. I suspect deconditioning is may be adding to the level of dyspnea is having. Primary function tests and 6 minute walk test within acceptable limits. Pulmonary function test showed FEV1 of 89% FEV1/FVC 79%, flow loops with mild flattening at end expiratory showing may be some early signs of obstruction.  He does have significant coronary artery disease and recently had a quadruple bypass surgery. -Symbicort 160 twice daily doing better since starting  Allergic rhinitis, seasonal Symptoms  today consistent mostly with allergic rhinitis and postnasal drip. He does have Flonase at home that he uses once per day to each nostril  Plan: -Flonase twice a day  Deconditioned state -Exercise as tolerated, 30 minutes of walking at a moderate pace at least 3 times per week as  tolerated  Intermittent cough Guaif with codeine as needed  Patient/Family are satisfied with Plan of action and management. All questions answered Follow-up in 3 months with wife   Corrin Parker, M.D.  [facility]  Medical Director [place] Director [facility] Cardio-Pulmonary Department

## 2017-04-12 LAB — URINE CULTURE

## 2017-04-24 ENCOUNTER — Telehealth: Payer: Self-pay | Admitting: *Deleted

## 2017-04-24 NOTE — Telephone Encounter (Signed)
Entered in error

## 2017-04-26 ENCOUNTER — Ambulatory Visit: Payer: Medicare Other

## 2017-04-26 VITALS — BP 125/75 | HR 77 | Ht 75.0 in | Wt 204.0 lb

## 2017-04-26 DIAGNOSIS — N390 Urinary tract infection, site not specified: Secondary | ICD-10-CM | POA: Diagnosis not present

## 2017-04-26 LAB — MICROSCOPIC EXAMINATION

## 2017-04-26 LAB — URINALYSIS, COMPLETE
Bilirubin, UA: NEGATIVE
GLUCOSE, UA: NEGATIVE
KETONES UA: NEGATIVE
Nitrite, UA: NEGATIVE
Protein, UA: NEGATIVE
SPEC GRAV UA: 1.015 (ref 1.005–1.030)
Urobilinogen, Ur: 0.2 mg/dL (ref 0.2–1.0)
pH, UA: 5.5 (ref 5.0–7.5)

## 2017-04-26 MED ORDER — SULFAMETHOXAZOLE-TRIMETHOPRIM 800-160 MG PO TABS: 1.0000 | ORAL_TABLET | Freq: Two times a day (BID) | ORAL | 0 refills | Status: AC

## 2017-04-26 NOTE — Addendum Note (Signed)
Addended by: Toniann Fail C on: 04/26/2017 04:08 PM   Modules accepted: Orders

## 2017-04-26 NOTE — Progress Notes (Addendum)
Pt presents today with c/o urinary frequency, urgency, dysuria, leakage of urine, back pain, nausea, and night sweats. Pt has been on cipro in the last 30 for a UTI given by urgent care. A clean catch was obtained for u/a and cx.   Blood pressure 125/75, pulse 77, height 6\' 3"  (1.905 m), weight 204 lb (92.5 kg).  Per Dr. Bernardo Heater pt was given septra ds bid x10 days.

## 2017-04-29 LAB — CULTURE, URINE COMPREHENSIVE

## 2017-05-01 ENCOUNTER — Telehealth: Payer: Self-pay | Admitting: Family Medicine

## 2017-05-01 NOTE — Telephone Encounter (Signed)
Patient notified

## 2017-05-01 NOTE — Telephone Encounter (Signed)
-----   Message from Abbie Sons, MD sent at 04/30/2017  9:37 AM EST ----- Urine culture was positive and sensitive to Septra

## 2017-05-05 ENCOUNTER — Other Ambulatory Visit: Payer: Medicare Other

## 2017-05-05 ENCOUNTER — Other Ambulatory Visit: Payer: Self-pay

## 2017-05-05 ENCOUNTER — Encounter: Payer: Self-pay | Admitting: Urology

## 2017-05-05 DIAGNOSIS — R972 Elevated prostate specific antigen [PSA]: Secondary | ICD-10-CM

## 2017-05-07 NOTE — Progress Notes (Signed)
05/08/2017 9:09 AM   Roger Pruitt 1951/02/27 [id number]  Referring provider: Casilda Carls, [address] [place], [place] [address]  Chief Complaint  Patient presents with  . Follow-up    HPI: 67 year old male who presents today for routine follow-up.  He has a personal history of BPH status post KTP laser ablation with Dr. Elnoria Howard in 03/2014.   He caths himself occasionally.  He is currently also on Flomax.  He also has a personal history of gross hematuria. He underwent hematuria workup and 05/2015 into no GU abnormalities were found other than left lateral prostatic regrowth.   His UA today is negative for hematuria.    He is also noted to have a recent rise in his PSA. It was previously 0.6 on 10/26/2015. It is risen to 3.3 on 10/18/2016.  Most recent rectal exam on 09/19/2016 45 g with a firm right lobe but no discrete nodules.  PSA in 10/2016 was 3.0.    He does also have severe baseline erectile dysfunction as well as decreased libido.    He was diagnosed with a pan sensitive E. Coli UTI in 03/2017.  No symptoms of an UTI today.    PMH: Past Medical History:  Diagnosis Date  . Abdominal pain   . Acute cystitis without hematuria   . Anemia   . Anginal pain ([place])   . Arthritis   . Asthma   . Bipolar disorder ([place])    BIPOLAR/ SCHIZO/ AFFECTIVE   . BPD (bronchopulmonary dysplasia)   . BPH (benign prostatic hyperplasia)   . CAD (coronary artery disease)    a. s/p MI 2001, 2012;  b. s/p prior LCX stenting; b. 05/2012 Abnl MV; c. 05/2012 Cath: patent LCX stent w/ otw mod non-obs dzs->Med Rx.  . Carpal tunnel syndrome   . Depression   . Disc degeneration, lumbar   . Dyspnea    WITH EXERTION  . Elevated PSA   . Esophageal reflux   . GERD (gastroesophageal reflux disease)   . Hematuria    history of gross and microscopic  . Hemorrhoid   . History of balanitis   . History of bladder infections   . History of echocardiogram    a. 07/2014 Echo: EF 60-65%, no rwma, nl  LA size, nl RV/PASP.  Marland Kitchen History of urethral stricture   . Hyperlipidemia   . Hypertension   . Hypertensive heart disease   . Incomplete bladder emptying   . Iron deficiency anemia, unspecified   . Lower urinary tract symptoms   . Myocardial infarction ([place])    X7 LAST 2001  . Neuropathy   . Nocturia   . Obsessive compulsive disorder   . Other dysphagia   . Panic attack   . Parkinson's disease ([place])   . Sleep apnea    SURGICAL TX  . Stroke ([place])   . Type II diabetes mellitus ([place])   . Unspecified vitamin D deficiency   . Urethral stricture   . Urinary retention   . Varicose vein of leg   . Vitamin D deficiency     Surgical History: Past Surgical History:  Procedure Laterality Date  . BACK SURGERY     lumbar disc  . BACK SURGERY    . CARDIAC CATHETERIZATION  6/10;2011;Aug.2012;Oct. 2012;   . CARDIAC CATHETERIZATION  05/2012   [facility]; EF 60%, patent stent in the left circumflex, moderate three-vessel disease with no flow-limiting lesions. Unchanged from most recent catheterization  . CARDIAC CATHETERIZATION N/A 12/10/2015  Procedure: Left Heart Cath and Coronary Angiography;  Surgeon: Corey Skains, MD;  Location: [facility];  Service: Cardiovascular;  Laterality: N/A;  . CARPAL TUNNEL RELEASE  2009   left  . CATARACT EXTRACTION W/PHACO Left 02/14/2017   Procedure: CATARACT EXTRACTION PHACO AND INTRAOCULAR LENS PLACEMENT (IOC);  Surgeon: Birder Robson, MD;  Location: [facility];  Service: Ophthalmology;  Laterality: Left;  [place] 00:42 AP% 16.6 CDE 7.14 Fluid pack lot # [id number]  . CATARACT EXTRACTION W/PHACO Right 02/28/2017   Procedure: CATARACT EXTRACTION PHACO AND INTRAOCULAR LENS PLACEMENT (IOC);  Surgeon: Birder Robson, MD;  Location: [facility];  Service: Ophthalmology;  Laterality: Right;  [place] 01:09 AP% 11.4 CDE 7.98 Fluid pack lot # [id number]  . CORONARY ANGIOPLASTY  2011 & 2001   s/p stent  . CORONARY ARTERY BYPASS GRAFT     stent placement  .  CORONARY ARTERY BYPASS GRAFT N/A 12/25/2015   Procedure: CORONARY ARTERY BYPASS GRAFTING (CABG) x4  , using left internal mammary artery and right leg greater saphenous vein harvested endoscopically LIMA-LAD SVG-OM2 SVG-DIAG SVG-PD;  Surgeon: Melrose Nakayama, MD;  Location: [facility];  Service: Open Heart Surgery;  Laterality: N/A;  . [place]  . CYST REMOVAL LEG Right   . SKIN GRAFT    . TEE WITHOUT CARDIOVERSION N/A 12/25/2015   Procedure: TRANSESOPHAGEAL ECHOCARDIOGRAM (TEE);  Surgeon: Melrose Nakayama, MD;  Location: [facility];  Service: Open Heart Surgery;  Laterality: N/A;  . TONSILLECTOMY      Home Medications:  Allergies as of 05/08/2017      Reactions   Morphine And Related Nausea And Vomiting   Synalgos-dc [aspirin-caff-dihydrocodeine] Itching, Other (See Comments)   Reaction:  Stinging    Synchlor A [chlorpheniramine] Rash, Other (See Comments)   Patient doesn't recall this allergy.      Medication List        Accurate as of 05/08/17  9:09 AM. Always use your most recent med list.          acetaminophen 500 MG tablet Commonly known as:  TYLENOL Take 1,000 mg by mouth every 6 (six) hours as needed for moderate pain or headache.   amLODipine 10 MG tablet Commonly known as:  NORVASC Take 10 mg by mouth daily.   atorvastatin 40 MG tablet Commonly known as:  LIPITOR Take 1 tablet (40 mg total) by mouth daily at 6 PM.   budesonide-formoterol 160-4.5 MCG/ACT inhaler Commonly known as:  SYMBICORT Inhale 2 puffs into the lungs 2 (two) times daily.   buPROPion 300 MG 24 hr tablet Commonly known as:  WELLBUTRIN XL Take 300 mg by mouth daily.   carvedilol 12.5 MG tablet Commonly known as:  COREG Take 12.5 mg by mouth 2 (two) times daily.   cephALEXin 500 MG capsule Commonly known as:  KEFLEX Take 1 capsule (500 mg total) by mouth 2 (two) times daily.   clopidogrel 75 MG tablet Commonly known as:  PLAVIX Take 75 mg by mouth daily.   COMBIVENT  RESPIMAT 20-100 MCG/ACT Aers respimat Generic drug:  Ipratropium-Albuterol Inhale 2 puffs into the lungs every 6 (six) hours as needed for wheezing or shortness of breath.   DEPEND ADJUSTABLE UNDERWEAR LG Misc Wear pads tid   doxepin 25 MG capsule Commonly known as:  SINEQUAN Take 50 mg daily by mouth.   DUREZOL 0.05 % Emul Generic drug:  Difluprednate Place 1 drop into the left eye at bedtime.   fluticasone 50 MCG/ACT nasal spray  Commonly known as:  FLONASE Place 2 sprays into both nostrils daily.   gabapentin 300 MG capsule Commonly known as:  NEURONTIN Take 300 mg 3 (three) times daily by mouth.   guaiFENesin-codeine 100-10 MG/5ML syrup Take 5 mLs by mouth every 4 (four) hours as needed for cough.   JANUMET 50-1000 MG tablet Generic drug:  sitaGLIPtin-metformin Take 1 tablet by mouth 2 (two) times daily.   lisinopril 20 MG tablet Commonly known as:  PRINIVIL,ZESTRIL Take 20 mg 2 (two) times daily by mouth.   LORazepam 1 MG tablet Commonly known as:  ATIVAN Take 1-2 tablets (1-2 mg total) by mouth 2 (two) times daily. Pt takes one tablet in the morning and two at bedtime.   Na Sulfate-K Sulfate-Mg Sulf 17.5-3.13-1.6 GM/177ML Soln Commonly known as:  SUPREP BOWEL PREP KIT Take 1 kit by mouth as directed.   nitroGLYCERIN 0.4 MG/SPRAY spray Commonly known as:  NITROLINGUAL Place 1 spray under the tongue every 5 (five) minutes x 3 doses as needed for chest pain.   pantoprazole 40 MG tablet Commonly known as:  PROTONIX Take 40 mg by mouth daily.   risperidone 4 MG tablet Commonly known as:  RISPERDAL Take 4 mg by mouth at bedtime.   tamsulosin 0.4 MG Caps capsule Commonly known as:  FLOMAX Take 1 capsule (0.4 mg total) by mouth daily.   topiramate 100 MG tablet Commonly known as:  TOPAMAX Take 100 mg by mouth daily.   traMADol 50 MG tablet Commonly known as:  ULTRAM Take 50 mg by mouth every 6 (six) hours as needed for moderate pain.       Allergies:    Allergies  Allergen Reactions  . Morphine And Related Nausea And Vomiting  . Synalgos-Dc [Aspirin-Caff-Dihydrocodeine] Itching and Other (See Comments)    Reaction:  Stinging   . Synchlor A [Chlorpheniramine] Rash and Other (See Comments)    Patient doesn't recall this allergy.    Family History: Family History  Problem Relation Age of Onset  . Heart disease Mother   . Heart disease Brother   . Prostate cancer Father   . Heart disease Unknown   . Heart disease Brother   . Kidney disease Neg Hx   . Kidney cancer Neg Hx   . Bladder Cancer Neg Hx     Social History:  reports that he quit smoking about 17 years ago. His smoking use included cigarettes. He has a 7.50 pack-year smoking history. he has never used smokeless tobacco. He reports that he does not drink alcohol or use drugs.  ROS: UROLOGY Frequent Urination?: Yes Hard to postpone urination?: Yes Burning/pain with urination?: Yes Get up at night to urinate?: Yes Leakage of urine?: Yes Urine stream starts and stops?: No Trouble starting stream?: Yes Do you have to strain to urinate?: No Blood in urine?: No Urinary tract infection?: Yes Sexually transmitted disease?: No Injury to kidneys or bladder?: No Painful intercourse?: No Weak stream?: No Erection problems?: Yes Penile pain?: No  Gastrointestinal Nausea?: No Vomiting?: No Indigestion/heartburn?: Yes Diarrhea?: No Constipation?: No  Constitutional Fever: No Night sweats?: No Weight loss?: No Fatigue?: Yes  Skin Skin rash/lesions?: No Itching?: No  Eyes Blurred vision?: No Double vision?: No  Ears/Nose/Throat Sore throat?: No Sinus problems?: Yes  Hematologic/Lymphatic Swollen glands?: No Easy bruising?: Yes  Cardiovascular Leg swelling?: Yes Chest pain?: Yes  Respiratory Cough?: Yes Shortness of breath?: Yes  Endocrine Excessive thirst?: Yes  Musculoskeletal Back pain?: Yes Joint pain?: Yes  Neurological Headaches?:  No Dizziness?: No  Psychologic Depression?: Yes Anxiety?: No  Physical Exam: BP 106/67   Pulse 67   Ht '6\' 3"'  (1.905 m)   Wt 204 lb (92.5 kg)   BMI 25.50 kg/m   Constitutional: Well nourished. Alert and oriented, No acute distress. HEENT: [place] AT, moist mucus membranes. Trachea midline, no masses. Cardiovascular: No clubbing, cyanosis, or edema. Respiratory: Normal respiratory effort, no increased work of breathing. GI: Abdomen is soft, non tender, non distended, no abdominal masses. Liver and spleen not palpable.  No hernias appreciated.  Stool sample for occult testing is not indicated.   GU: No CVA tenderness.  No bladder fullness or masses.  Patient with circumcised/uncircumcised phallus.  Foreskin easily retracted  Urethral meatus is patent.  No penile discharge. No penile lesions or rashes. Scrotum without lesions, cysts, rashes and/or edema.  Testicles are located scrotally bilaterally. No masses are appreciated in the testicles. Left and right epididymis are normal. Rectal: Patient with  normal sphincter tone. Anus and perineum without scarring or rashes. No rectal masses are appreciated. Prostate is approximately 45 grams, firm in the right lobe, no nodules are appreciated. Seminal vesicles are normal. Skin: No rashes, bruises or suspicious lesions. Lymph: No cervical or inguinal adenopathy. Neurologic: Grossly intact, no focal deficits, moving all 4 extremities. Psychiatric: Normal mood and affect.  Laboratory Data:  Lab Results  Component Value Date   WBC 11.6 (H) 10/19/2016   HGB 12.5 (L) 10/19/2016   HCT 36.8 (L) 10/19/2016   MCV 90.2 10/19/2016   PLT 156 10/19/2016    Lab Results  Component Value Date   CREATININE 0.88 10/19/2016    Component     Latest Ref Rng & Units 10/18/2016  Prostate Specific Ag, Serum     0.0 - 4.0 ng/mL 3.3   Component     Latest Ref Rng & Units 10/26/2016  Prostate Specific Ag, Serum     0.0 - 4.0 ng/mL 3.0   Lab Results   Component Value Date   HGBA1C 5.1 12/22/2015    Urinalysis 11-30 WBC's,  See Epic.    Pertinent Imaging: No new interval imaging  Assessment & Plan:    1. Microscopic hematuria Microscopic hematuria status post negative workup in 05/2015 Likely related to friable prostatic regrowth If microscopic hematuria persists, would repeat cystoscopy/ RUS in 2019 - Urinalysis, Complete - negative today -RTC in one year for UA -report any gross hematuria  2. Elevated PSA Repeat PSA pending today, will call with results - PSA  3. BPH with obstruction/lower urinary tract symptoms Flomax script send to pharmacy Start CIC 1 time a day  4. Recurrent UTI Denies symptoms of UTI today Risk factors for infection include incomplete bladder emptying Patient is likely chronically colonized due to CIC, only treat with symptoms   Return for pending PSA results .  Zara Council, PA-C  [facility] [address] [place], [place] [address] [phone]

## 2017-05-08 ENCOUNTER — Ambulatory Visit (INDEPENDENT_AMBULATORY_CARE_PROVIDER_SITE_OTHER): Payer: Medicare Other | Admitting: Urology

## 2017-05-08 ENCOUNTER — Encounter: Payer: Self-pay | Admitting: Urology

## 2017-05-08 VITALS — BP 106/67 | HR 67 | Ht 75.0 in | Wt 204.0 lb

## 2017-05-08 DIAGNOSIS — N390 Urinary tract infection, site not specified: Secondary | ICD-10-CM

## 2017-05-08 DIAGNOSIS — R3129 Other microscopic hematuria: Secondary | ICD-10-CM | POA: Diagnosis not present

## 2017-05-08 DIAGNOSIS — N138 Other obstructive and reflux uropathy: Secondary | ICD-10-CM

## 2017-05-08 DIAGNOSIS — N401 Enlarged prostate with lower urinary tract symptoms: Secondary | ICD-10-CM

## 2017-05-08 DIAGNOSIS — R972 Elevated prostate specific antigen [PSA]: Secondary | ICD-10-CM | POA: Diagnosis not present

## 2017-05-08 LAB — URINALYSIS, COMPLETE
Bilirubin, UA: NEGATIVE
GLUCOSE, UA: NEGATIVE
KETONES UA: NEGATIVE
NITRITE UA: NEGATIVE
Protein, UA: NEGATIVE
RBC, UA: NEGATIVE
SPEC GRAV UA: 1.015 (ref 1.005–1.030)
UUROB: 1 mg/dL (ref 0.2–1.0)
pH, UA: 7 (ref 5.0–7.5)

## 2017-05-08 LAB — MICROSCOPIC EXAMINATION: RBC MICROSCOPIC, UA: NONE SEEN /HPF (ref 0–?)

## 2017-05-08 MED ORDER — TAMSULOSIN HCL 0.4 MG PO CAPS: 0.4000 mg | ORAL_CAPSULE | Freq: Every day | ORAL | 3 refills | Status: DC

## 2017-05-09 LAB — PSA: Prostate Specific Ag, Serum: 1.4 ng/mL (ref 0.0–4.0)

## 2017-05-11 DIAGNOSIS — M545 Low back pain: Secondary | ICD-10-CM | POA: Diagnosis not present

## 2017-05-11 DIAGNOSIS — E559 Vitamin D deficiency, unspecified: Secondary | ICD-10-CM | POA: Diagnosis not present

## 2017-05-11 DIAGNOSIS — D509 Iron deficiency anemia, unspecified: Secondary | ICD-10-CM | POA: Diagnosis not present

## 2017-05-11 DIAGNOSIS — E785 Hyperlipidemia, unspecified: Secondary | ICD-10-CM | POA: Diagnosis not present

## 2017-05-11 DIAGNOSIS — T7840XD Allergy, unspecified, subsequent encounter: Secondary | ICD-10-CM | POA: Diagnosis not present

## 2017-05-11 DIAGNOSIS — K210 Gastro-esophageal reflux disease with esophagitis: Secondary | ICD-10-CM | POA: Diagnosis not present

## 2017-05-11 DIAGNOSIS — R739 Hyperglycemia, unspecified: Secondary | ICD-10-CM | POA: Diagnosis not present

## 2017-05-11 DIAGNOSIS — D519 Vitamin B12 deficiency anemia, unspecified: Secondary | ICD-10-CM | POA: Diagnosis not present

## 2017-05-11 DIAGNOSIS — I10 Essential (primary) hypertension: Secondary | ICD-10-CM | POA: Diagnosis not present

## 2017-05-11 DIAGNOSIS — F319 Bipolar disorder, unspecified: Secondary | ICD-10-CM | POA: Diagnosis not present

## 2017-05-22 DIAGNOSIS — F319 Bipolar disorder, unspecified: Secondary | ICD-10-CM | POA: Diagnosis not present

## 2017-05-22 DIAGNOSIS — K210 Gastro-esophageal reflux disease with esophagitis: Secondary | ICD-10-CM | POA: Diagnosis not present

## 2017-05-22 DIAGNOSIS — D519 Vitamin B12 deficiency anemia, unspecified: Secondary | ICD-10-CM | POA: Diagnosis not present

## 2017-05-22 DIAGNOSIS — R739 Hyperglycemia, unspecified: Secondary | ICD-10-CM | POA: Diagnosis not present

## 2017-05-22 DIAGNOSIS — D509 Iron deficiency anemia, unspecified: Secondary | ICD-10-CM | POA: Diagnosis not present

## 2017-05-22 DIAGNOSIS — E785 Hyperlipidemia, unspecified: Secondary | ICD-10-CM | POA: Diagnosis not present

## 2017-05-22 DIAGNOSIS — M545 Low back pain: Secondary | ICD-10-CM | POA: Diagnosis not present

## 2017-05-22 DIAGNOSIS — T7840XD Allergy, unspecified, subsequent encounter: Secondary | ICD-10-CM | POA: Diagnosis not present

## 2017-05-22 DIAGNOSIS — E559 Vitamin D deficiency, unspecified: Secondary | ICD-10-CM | POA: Diagnosis not present

## 2017-05-22 DIAGNOSIS — I10 Essential (primary) hypertension: Secondary | ICD-10-CM | POA: Diagnosis not present

## 2017-06-07 DIAGNOSIS — E782 Mixed hyperlipidemia: Secondary | ICD-10-CM | POA: Diagnosis not present

## 2017-06-07 DIAGNOSIS — I2510 Atherosclerotic heart disease of native coronary artery without angina pectoris: Secondary | ICD-10-CM | POA: Diagnosis not present

## 2017-06-07 DIAGNOSIS — G4733 Obstructive sleep apnea (adult) (pediatric): Secondary | ICD-10-CM | POA: Diagnosis not present

## 2017-06-07 DIAGNOSIS — I10 Essential (primary) hypertension: Secondary | ICD-10-CM | POA: Diagnosis not present

## 2017-06-21 ENCOUNTER — Other Ambulatory Visit: Payer: Self-pay

## 2017-06-21 DIAGNOSIS — Z86010 Personal history of colonic polyps: Secondary | ICD-10-CM

## 2017-06-22 ENCOUNTER — Encounter: Payer: Self-pay | Admitting: *Deleted

## 2017-06-22 ENCOUNTER — Other Ambulatory Visit: Payer: Self-pay

## 2017-06-26 NOTE — Discharge Instructions (Signed)
General Anesthesia, Adult, Care After °These instructions provide you with information about caring for yourself after your procedure. Your health care provider may also give you more specific instructions. Your treatment has been planned according to current medical practices, but problems sometimes occur. Call your health care provider if you have any problems or questions after your procedure. °What can I expect after the procedure? °After the procedure, it is common to have: °· Vomiting. °· A sore throat. °· Mental slowness. ° °It is common to feel: °· Nauseous. °· Cold or shivery. °· Sleepy. °· Tired. °· Sore or achy, even in parts of your body where you did not have surgery. ° °Follow these instructions at home: °For at least 24 hours after the procedure: °· Do not: °? Participate in activities where you could fall or become injured. °? Drive. °? Use heavy machinery. °? Drink alcohol. °? Take sleeping pills or medicines that cause drowsiness. °? Make important decisions or sign legal documents. °? Take care of children on your own. °· Rest. °Eating and drinking °· If you vomit, drink water, juice, or soup when you can drink without vomiting. °· Drink enough fluid to keep your urine clear or pale yellow. °· Make sure you have little or no nausea before eating solid foods. °· Follow the diet recommended by your health care provider. °General instructions °· Have a responsible adult stay with you until you are awake and alert. °· Return to your normal activities as told by your health care provider. Ask your health care provider what activities are safe for you. °· Take over-the-counter and prescription medicines only as told by your health care provider. °· If you smoke, do not smoke without supervision. °· Keep all follow-up visits as told by your health care provider. This is important. °Contact a health care provider if: °· You continue to have nausea or vomiting at home, and medicines are not helpful. °· You  cannot drink fluids or start eating again. °· You cannot urinate after 8-12 hours. °· You develop a skin rash. °· You have fever. °· You have increasing redness at the site of your procedure. °Get help right away if: °· You have difficulty breathing. °· You have chest pain. °· You have unexpected bleeding. °· You feel that you are having a life-threatening or urgent problem. °This information is not intended to replace advice given to you by your health care provider. Make sure you discuss any questions you have with your health care provider. °Document Released: 06/20/2000 Document Revised: 08/17/2015 Document Reviewed: 02/26/2015 °Elsevier Interactive Patient Education © 2018 [employer] ° °

## 2017-06-29 ENCOUNTER — Ambulatory Visit
Admission: RE | Admit: 2017-06-29 | Discharge: 2017-06-29 | Disposition: A | Discharge status: Home / Self Care | Payer: Medicare Other | Source: Ambulatory Visit | Attending: Gastroenterology | Admitting: Gastroenterology

## 2017-06-29 ENCOUNTER — Encounter
Admission: RE | Disposition: A | Discharge status: Home / Self Care | Payer: Self-pay | Source: Ambulatory Visit | Attending: Gastroenterology

## 2017-06-29 ENCOUNTER — Ambulatory Visit: Payer: Medicare Other | Admitting: Anesthesiology

## 2017-06-29 DIAGNOSIS — I2510 Atherosclerotic heart disease of native coronary artery without angina pectoris: Secondary | ICD-10-CM | POA: Diagnosis not present

## 2017-06-29 DIAGNOSIS — E785 Hyperlipidemia, unspecified: Secondary | ICD-10-CM | POA: Diagnosis not present

## 2017-06-29 DIAGNOSIS — K219 Gastro-esophageal reflux disease without esophagitis: Secondary | ICD-10-CM | POA: Diagnosis not present

## 2017-06-29 DIAGNOSIS — D120 Benign neoplasm of cecum: Secondary | ICD-10-CM

## 2017-06-29 DIAGNOSIS — F319 Bipolar disorder, unspecified: Secondary | ICD-10-CM | POA: Insufficient documentation

## 2017-06-29 DIAGNOSIS — Z955 Presence of coronary angioplasty implant and graft: Secondary | ICD-10-CM | POA: Diagnosis not present

## 2017-06-29 DIAGNOSIS — J45909 Unspecified asthma, uncomplicated: Secondary | ICD-10-CM | POA: Diagnosis not present

## 2017-06-29 DIAGNOSIS — M1990 Unspecified osteoarthritis, unspecified site: Secondary | ICD-10-CM | POA: Diagnosis not present

## 2017-06-29 DIAGNOSIS — D509 Iron deficiency anemia, unspecified: Secondary | ICD-10-CM | POA: Diagnosis not present

## 2017-06-29 DIAGNOSIS — E1140 Type 2 diabetes mellitus with diabetic neuropathy, unspecified: Secondary | ICD-10-CM | POA: Insufficient documentation

## 2017-06-29 DIAGNOSIS — F419 Anxiety disorder, unspecified: Secondary | ICD-10-CM | POA: Insufficient documentation

## 2017-06-29 DIAGNOSIS — Z87891 Personal history of nicotine dependence: Secondary | ICD-10-CM | POA: Insufficient documentation

## 2017-06-29 DIAGNOSIS — F429 Obsessive-compulsive disorder, unspecified: Secondary | ICD-10-CM | POA: Diagnosis not present

## 2017-06-29 DIAGNOSIS — E559 Vitamin D deficiency, unspecified: Secondary | ICD-10-CM | POA: Insufficient documentation

## 2017-06-29 DIAGNOSIS — Z1211 Encounter for screening for malignant neoplasm of colon: Secondary | ICD-10-CM

## 2017-06-29 DIAGNOSIS — Z7984 Long term (current) use of oral hypoglycemic drugs: Secondary | ICD-10-CM | POA: Diagnosis not present

## 2017-06-29 DIAGNOSIS — Z7982 Long term (current) use of aspirin: Secondary | ICD-10-CM | POA: Insufficient documentation

## 2017-06-29 DIAGNOSIS — G20 Parkinson's disease: Secondary | ICD-10-CM | POA: Insufficient documentation

## 2017-06-29 DIAGNOSIS — K2970 Gastritis, unspecified, without bleeding: Secondary | ICD-10-CM

## 2017-06-29 DIAGNOSIS — F410 Panic disorder [episodic paroxysmal anxiety] without agoraphobia: Secondary | ICD-10-CM | POA: Diagnosis not present

## 2017-06-29 DIAGNOSIS — K641 Second degree hemorrhoids: Secondary | ICD-10-CM | POA: Insufficient documentation

## 2017-06-29 DIAGNOSIS — D122 Benign neoplasm of ascending colon: Secondary | ICD-10-CM

## 2017-06-29 DIAGNOSIS — Z8673 Personal history of transient ischemic attack (TIA), and cerebral infarction without residual deficits: Secondary | ICD-10-CM | POA: Insufficient documentation

## 2017-06-29 DIAGNOSIS — Z8249 Family history of ischemic heart disease and other diseases of the circulatory system: Secondary | ICD-10-CM | POA: Insufficient documentation

## 2017-06-29 DIAGNOSIS — D500 Iron deficiency anemia secondary to blood loss (chronic): Secondary | ICD-10-CM

## 2017-06-29 DIAGNOSIS — Z86010 Personal history of colonic polyps: Secondary | ICD-10-CM | POA: Diagnosis not present

## 2017-06-29 DIAGNOSIS — G4730 Sleep apnea, unspecified: Secondary | ICD-10-CM | POA: Insufficient documentation

## 2017-06-29 DIAGNOSIS — Z79899 Other long term (current) drug therapy: Secondary | ICD-10-CM | POA: Insufficient documentation

## 2017-06-29 DIAGNOSIS — Z87440 Personal history of urinary (tract) infections: Secondary | ICD-10-CM | POA: Diagnosis not present

## 2017-06-29 DIAGNOSIS — I252 Old myocardial infarction: Secondary | ICD-10-CM | POA: Insufficient documentation

## 2017-06-29 DIAGNOSIS — K2930 Chronic superficial gastritis without bleeding: Secondary | ICD-10-CM | POA: Diagnosis not present

## 2017-06-29 DIAGNOSIS — Z951 Presence of aortocoronary bypass graft: Secondary | ICD-10-CM | POA: Diagnosis not present

## 2017-06-29 HISTORY — PX: POLYPECTOMY: SHX149

## 2017-06-29 HISTORY — PX: COLONOSCOPY WITH PROPOFOL: SHX5780

## 2017-06-29 HISTORY — PX: ESOPHAGOGASTRODUODENOSCOPY: SHX5428

## 2017-06-29 LAB — GLUCOSE, CAPILLARY: Glucose-Capillary: 108 mg/dL — ABNORMAL HIGH (ref 65–99)

## 2017-06-29 SURGERY — COLONOSCOPY WITH PROPOFOL
Anesthesia: General | Wound class: Contaminated

## 2017-06-29 MED ORDER — LIDOCAINE HCL (CARDIAC) 20 MG/ML IV SOLN
INTRAVENOUS | Status: DC | PRN
  Administered 2017-06-29: 20 mg via INTRAVENOUS

## 2017-06-29 MED ORDER — OXYCODONE HCL 5 MG/5ML PO SOLN: 5.0000 mg | Freq: Once | ORAL | Status: DC | PRN

## 2017-06-29 MED ORDER — FENTANYL CITRATE (PF) 100 MCG/2ML IJ SOLN: 25.0000 ug | INTRAMUSCULAR | Status: DC | PRN

## 2017-06-29 MED ORDER — OXYCODONE HCL 5 MG PO TABS: 5.0000 mg | ORAL_TABLET | Freq: Once | ORAL | Status: DC | PRN

## 2017-06-29 MED ORDER — LACTATED RINGERS IV SOLN
INTRAVENOUS | Status: DC
  Administered 2017-06-29: 08:00:00 via INTRAVENOUS

## 2017-06-29 MED ORDER — ACETAMINOPHEN 10 MG/ML IV SOLN: 1000.0000 mg | Freq: Once | INTRAVENOUS | Status: DC | PRN

## 2017-06-29 MED ORDER — ONDANSETRON HCL 4 MG/2ML IJ SOLN: 4.0000 mg | Freq: Once | INTRAMUSCULAR | Status: DC | PRN

## 2017-06-29 MED ORDER — PROPOFOL 10 MG/ML IV BOLUS
INTRAVENOUS | Status: DC | PRN
  Administered 2017-06-29: 10 mg via INTRAVENOUS
  Administered 2017-06-29: 20 mg via INTRAVENOUS
  Administered 2017-06-29: 10 mg via INTRAVENOUS
  Administered 2017-06-29: 20 mg via INTRAVENOUS
  Administered 2017-06-29 (×4): 10 mg via INTRAVENOUS
  Administered 2017-06-29: 20 mg via INTRAVENOUS
  Administered 2017-06-29: 60 mg via INTRAVENOUS
  Administered 2017-06-29: 20 mg via INTRAVENOUS

## 2017-06-29 MED ORDER — GLYCOPYRROLATE 0.2 MG/ML IJ SOLN
INTRAMUSCULAR | Status: DC | PRN
  Administered 2017-06-29: 0.1 mg via INTRAVENOUS

## 2017-06-29 MED ORDER — LACTATED RINGERS IV SOLN: INTRAVENOUS | Status: DC

## 2017-06-29 SURGICAL SUPPLY — 24 items
CANISTER SUCT 1200ML W/VALVE (MISCELLANEOUS) ×4 IMPLANT
CLIP HMST 235XBRD CATH ROT (MISCELLANEOUS) IMPLANT
CLIP RESOLUTION 360 11X235 (MISCELLANEOUS)
ELECT REM PT RETURN 9FT ADLT (ELECTROSURGICAL)
ELECTRODE REM PT RTRN 9FT ADLT (ELECTROSURGICAL) IMPLANT
FCP ESCP3.2XJMB 240X2.8X (MISCELLANEOUS)
FORCEPS BIOP RAD 4 LRG CAP 4 (CUTTING FORCEPS) ×2 IMPLANT
FORCEPS BIOP RJ4 240 W/NDL (MISCELLANEOUS)
FORCEPS ESCP3.2XJMB 240X2.8X (MISCELLANEOUS) IMPLANT
GOWN CVR UNV OPN BCK APRN NK (MISCELLANEOUS) ×4 IMPLANT
GOWN ISOL THUMB LOOP REG UNIV (MISCELLANEOUS) ×8
INJECTOR VARIJECT VIN23 (MISCELLANEOUS) IMPLANT
KIT DEFENDO VALVE AND CONN (KITS) IMPLANT
KIT ENDO PROCEDURE OLY (KITS) ×4 IMPLANT
MARKER SPOT ENDO TATTOO 5ML (MISCELLANEOUS) IMPLANT
PROBE APC STR FIRE (PROBE) IMPLANT
RETRIEVER NET ROTH 2.5X230 LF (MISCELLANEOUS) IMPLANT
SNARE SHORT THROW 13M SML OVAL (MISCELLANEOUS) ×2 IMPLANT
SNARE SHORT THROW 30M LRG OVAL (MISCELLANEOUS) IMPLANT
SNARE SNG USE RND 15MM (INSTRUMENTS) IMPLANT
SPOT EX ENDOSCOPIC TATTOO (MISCELLANEOUS)
TRAP ETRAP POLY (MISCELLANEOUS) ×2 IMPLANT
VARIJECT INJECTOR VIN23 (MISCELLANEOUS)
WATER STERILE IRR 250ML POUR (IV SOLUTION) ×4 IMPLANT

## 2017-06-29 NOTE — Anesthesia Preprocedure Evaluation (Signed)
Anesthesia Evaluation  Patient identified by MRN, date of birth, ID band Patient awake    Reviewed: Allergy & Precautions, NPO status , Patient's Chart, lab work & pertinent test results  Airway Mallampati: II  TM Distance: >3 FB Neck ROM: Full    Dental  (+) Upper Dentures, Lower Dentures   Pulmonary asthma , former smoker (quit 2001),    Pulmonary exam normal breath sounds clear to auscultation       Cardiovascular hypertension, + CAD, + Past MI (2001) and + CABG (2016)  Normal cardiovascular exam Rhythm:Regular Rate:Normal     Neuro/Psych PSYCHIATRIC DISORDERS Anxiety Depression Bipolar Disorder CVA (ambulates with walker)    GI/Hepatic GERD  Controlled,  Endo/Other  diabetes, Type 2  Renal/GU negative Renal ROS     Musculoskeletal  (+) Arthritis ,   Abdominal   Peds  Hematology  (+) Blood dyscrasia, anemia ,   Anesthesia Other Findings   Reproductive/Obstetrics                             Anesthesia Physical Anesthesia Plan  ASA: III  Anesthesia Plan: General   Post-op Pain Management:    Induction: Intravenous  PONV Risk Score and Plan: 2 and Propofol infusion and TIVA  Airway Management Planned: Natural Airway  Additional Equipment:   Intra-op Plan:   Post-operative Plan:   Informed Consent: I have reviewed the patients History and Physical, chart, labs and discussed the procedure including the risks, benefits and alternatives for the proposed anesthesia with the patient or authorized representative who has indicated his/her understanding and acceptance.     Plan Discussed with: CRNA  Anesthesia Plan Comments:         Anesthesia Quick Evaluation

## 2017-06-29 NOTE — Op Note (Signed)
[facility] Patient Name: Roger Pruitt Procedure Date: 06/29/2017 7:30 AM MRN: [id number] Account #: [id number] Date of Birth: 1951/01/14 Admit Type: Outpatient Age: 67 Room: [facility] OR ROOM 01 Gender: Male Note Status: Finalized Procedure:            Colonoscopy Indications:          Iron deficiency anemia Providers:            [name] MD, MD Medicines:            Propofol per Anesthesia Complications:        No immediate complications. Procedure:            Pre-Anesthesia Assessment:                       - Prior to the procedure, a History and Physical was                        performed, and patient medications and allergies were                        reviewed. The patient's tolerance of previous                        anesthesia was also reviewed. The risks and benefits of                        the procedure and the sedation options and risks were                        discussed with the patient. All questions were                        answered, and informed consent was obtained. Prior                        Anticoagulants: The patient has taken no previous                        anticoagulant or antiplatelet agents. ASA Grade                        Assessment: II - A patient with mild systemic disease.                        After reviewing the risks and benefits, the patient was                        deemed in satisfactory condition to undergo the                        procedure.                       After obtaining informed consent, the colonoscope was                        passed under direct vision. Throughout the procedure,                        the patient's blood pressure, pulse, and  oxygen                        saturations were monitored continuously. The [place] [id number]) was introduced through the                        anus and advanced to the the terminal ileum. The             colonoscopy was performed without difficulty. The                        patient tolerated the procedure well. The quality of                        the bowel preparation was poor. Findings:      The perianal and digital rectal examinations were normal.      A 7 mm polyp was found in the cecum. The polyp was sessile. The polyp       was removed with a cold snare. Resection and retrieval were complete.      The terminal ileum appeared normal. Biopsies were taken with a cold       forceps for histology.      A 3 mm polyp was found in the ascending colon. The polyp was sessile.       The polyp was removed with a cold biopsy forceps. Resection and       retrieval were complete.      Non-bleeding internal hemorrhoids were found during retroflexion. The       hemorrhoids were Grade II (internal hemorrhoids that prolapse but reduce       spontaneously). Impression:           - Preparation of the colon was poor.                       - One 7 mm polyp in the cecum, removed with a cold                        snare. Resected and retrieved.                       - The examined portion of the ileum was normal.                        Biopsied.                       - One 3 mm polyp in the ascending colon, removed with a                        cold biopsy forceps. Resected and retrieved.                       - Non-bleeding internal hemorrhoids. Recommendation:       - Discharge patient to home.                       - Resume previous diet.                       -  Continue present medications.                       - Await pathology results.                       - Repeat colonoscopy in 5 years for surveillance. Procedure Code(s):    --- Professional ---                       [phone], Colonoscopy, flexible; with removal of tumor(s),                        polyp(s), or other lesion(s) by snare technique                       [id number], 66, Colonoscopy, flexible; with biopsy, single                         or multiple Diagnosis Code(s):    --- Professional ---                       D50.9, Iron deficiency anemia, unspecified                       D12.0, Benign neoplasm of cecum                       D12.2, Benign neoplasm of ascending colon CPT copyright 2017 American Medical Association. All rights reserved. The codes documented in this report are preliminary and upon coder review may  be revised to meet current compliance requirements. [name] MD, MD 06/29/2017 8:27:44 AM This report has been signed electronically. Number of Addenda: 0 Note Initiated On: 06/29/2017 7:30 AM Scope Withdrawal Time: 0 hours 11 minutes 29 seconds  Total Procedure Duration: 0 hours 18 minutes 50 seconds       [facility]

## 2017-06-29 NOTE — H&P (Addendum)
Roger Lame, MD [address] [place], [place] [address] Phone:[phone] Fax : [phone]  Primary Care Physician:  Roger Carls, MD Primary Gastroenterologist:  Dr. Allen Pruitt  Pre-Procedure History & Physical: HPI:  Roger Pruitt is a 67 y.o. male is here for an colonoscopy and EGD.   Past Medical History:  Diagnosis Date  . Abdominal pain   . Acute cystitis without hematuria   . Anemia   . Anginal pain ([place])   . Arthritis   . Asthma   . Bipolar disorder ([place])    BIPOLAR/ SCHIZO/ AFFECTIVE   . BPD (bronchopulmonary dysplasia)   . BPH (benign prostatic hyperplasia)   . CAD (coronary artery disease)    a. s/p MI 2001, 2012;  b. s/p prior LCX stenting; b. 05/2012 Abnl MV; c. 05/2012 Cath: patent LCX stent w/ otw mod non-obs dzs->Med Rx.  . Carpal tunnel syndrome   . Depression   . Disc degeneration, lumbar   . Dyspnea    WITH EXERTION  . Elevated PSA   . Esophageal reflux   . GERD (gastroesophageal reflux disease)   . Hematuria    history of gross and microscopic  . Hemorrhoid   . History of balanitis   . History of bladder infections   . History of echocardiogram    a. 07/2014 Echo: EF 60-65%, no rwma, nl LA size, nl RV/PASP.  Marland Kitchen History of urethral stricture   . Hyperlipidemia   . Hypertension   . Hypertensive heart disease   . Incomplete bladder emptying   . Iron deficiency anemia, unspecified   . Lower urinary tract symptoms   . Myocardial infarction ([place])    X7 LAST 2001  . Neuropathy   . Nocturia   . Obsessive compulsive disorder   . Other dysphagia   . Panic attack   . Parkinson's disease ([place])   . Sleep apnea    SURGICAL TX  . Stroke ([place])   . Type II diabetes mellitus ([place])   . Unspecified vitamin D deficiency   . Urethral stricture   . Urinary retention   . Varicose vein of leg   . Vitamin D deficiency     Past Surgical History:  Procedure Laterality Date  . BACK SURGERY     lumbar disc  . BACK SURGERY    . CARDIAC  CATHETERIZATION  6/10;2011;Aug.2012;Oct. 2012;   . CARDIAC CATHETERIZATION  05/2012   [facility]; EF 60%, patent stent in the left circumflex, moderate three-vessel disease with no flow-limiting lesions. Unchanged from most recent catheterization  . CARDIAC CATHETERIZATION N/A 12/10/2015   Procedure: Left Heart Cath and Coronary Angiography;  Surgeon: Corey Skains, MD;  Location: [facility];  Service: Cardiovascular;  Laterality: N/A;  . CARPAL TUNNEL RELEASE  2009   left  . CATARACT EXTRACTION W/PHACO Left 02/14/2017   Procedure: CATARACT EXTRACTION PHACO AND INTRAOCULAR LENS PLACEMENT (IOC);  Surgeon: Birder Robson, MD;  Location: [facility];  Service: Ophthalmology;  Laterality: Left;  [place] 00:42 AP% 16.6 CDE 7.14 Fluid pack lot # [id number]  . CATARACT EXTRACTION W/PHACO Right 02/28/2017   Procedure: CATARACT EXTRACTION PHACO AND INTRAOCULAR LENS PLACEMENT (IOC);  Surgeon: Birder Robson, MD;  Location: [facility];  Service: Ophthalmology;  Laterality: Right;  [place] 01:09 AP% 11.4 CDE 7.98 Fluid pack lot # [id number]  . CORONARY ANGIOPLASTY  2011 & 2001   s/p stent  . CORONARY ARTERY BYPASS GRAFT     stent placement  . CORONARY ARTERY BYPASS  GRAFT N/A 12/25/2015   Procedure: CORONARY ARTERY BYPASS GRAFTING (CABG) x4  , using left internal mammary artery and right leg greater saphenous vein harvested endoscopically LIMA-LAD SVG-OM2 SVG-DIAG SVG-PD;  Surgeon: Melrose Nakayama, MD;  Location: [employer] [facility];  Service: Open Heart Surgery;  Laterality: N/A;  . [place]  . CYST REMOVAL LEG Right   . SKIN GRAFT    . TEE WITHOUT CARDIOVERSION N/A 12/25/2015   Procedure: TRANSESOPHAGEAL ECHOCARDIOGRAM (TEE);  Surgeon: Melrose Nakayama, MD;  Location: [facility];  Service: Open Heart Surgery;  Laterality: N/A;  . TONSILLECTOMY    . UVULOPALATOPHARYNGOPLASTY      Prior to Admission medications   Medication Sig Start Date End Date Taking? Authorizing Provider  acetaminophen (TYLENOL)  500 MG tablet Take 1,000 mg by mouth every 6 (six) hours as needed for moderate pain or headache.    Yes [provider]  amLODipine (NORVASC) 10 MG tablet Take 10 mg by mouth daily.  10/06/16  Yes [provider]  ASPIRIN 81 PO Take by mouth daily.   Yes [provider]  atorvastatin (LIPITOR) 40 MG tablet Take 1 tablet (40 mg total) by mouth daily at 6 PM. 12/30/15  Yes Barrett, Erin R, PA-C  budesonide-formoterol (SYMBICORT) 160-4.5 MCG/ACT inhaler Inhale 2 puffs into the lungs 2 (two) times daily. 09/16/16  Yes Kasa, Maretta Bees, MD  buPROPion (WELLBUTRIN XL) 300 MG 24 hr tablet Take 300 mg by mouth daily.    Yes [provider]  carvedilol (COREG) 12.5 MG tablet Take 12.5 mg by mouth 2 (two) times daily.  09/27/16  Yes [provider]  doxepin (SINEQUAN) 25 MG capsule Take 50 mg daily by mouth.  08/02/16  Yes [provider]  fluticasone (FLONASE) 50 MCG/ACT nasal spray Place 2 sprays into both nostrils daily.    Yes [provider]  gabapentin (NEURONTIN) 300 MG capsule Take 300 mg 3 (three) times daily by mouth.  08/02/16  Yes [provider]  JANUMET 50-1000 MG tablet Take 1 tablet by mouth 2 (two) times daily. 08/02/16  Yes [provider]  lisinopril (PRINIVIL,ZESTRIL) 20 MG tablet Take 20 mg 2 (two) times daily by mouth.  08/02/16  Yes [provider]  LORazepam (ATIVAN) 1 MG tablet Take 1-2 tablets (1-2 mg total) by mouth 2 (two) times daily. Pt takes one tablet in the morning and two at bedtime. Patient taking differently: Take 1-2 mg by mouth See admin instructions. Take 1 mg by mouth in the morning and take 2 mg by mouth at bedtime 01/04/16  Yes Gladstone Lighter, MD  nitroGLYCERIN (NITROLINGUAL) 0.4 MG/SPRAY spray Place 1 spray under the tongue every 5 (five) minutes x 3 doses as needed for chest pain.   Yes [provider]  pantoprazole (PROTONIX) 40 MG tablet Take 40 mg by mouth daily.    Yes [provider]  risperidone (RISPERDAL) 4 MG tablet Take 4 mg by mouth at bedtime.  08/02/16  Yes [provider]  tamsulosin (FLOMAX) 0.4 MG CAPS capsule Take 1 capsule (0.4 mg total) by mouth daily. 05/08/17  Yes McGowan, Larene Beach A, PA-C  topiramate (TOPAMAX) 100 MG tablet Take 100 mg by mouth daily.    Yes [provider]  traMADol (ULTRAM) 50 MG tablet Take 50 mg by mouth every 6 (six) hours as needed for moderate pain.  08/08/16  Yes [provider]  cephALEXin (KEFLEX) 500 MG capsule Take 1 capsule (500 mg total) by mouth  2 (two) times daily. Patient not taking: Reported on 06/22/2017 04/08/17   Coral Spikes, DO  clopidogrel (PLAVIX) 75 MG tablet Take 75 mg by mouth daily. 11/25/15   [provider]  DUREZOL 0.05 % EMUL Place 1 drop into the left eye at bedtime. 02/03/17   [provider]  guaiFENesin-codeine 100-10 MG/5ML syrup Take 5 mLs by mouth every 4 (four) hours as needed for cough. Patient not taking: Reported on 06/22/2017 04/10/17   Flora Lipps, MD  Incontinence Supply Disposable (DEPEND ADJUSTABLE UNDERWEAR LG) MISC Wear pads tid 09/19/16   Zara Council A, PA-C  Ipratropium-Albuterol (COMBIVENT RESPIMAT) 20-100 MCG/ACT AERS respimat Inhale 2 puffs into the lungs every 6 (six) hours as needed for wheezing or shortness of breath.    [provider]  Na Sulfate-K Sulfate-Mg Sulf (SUPREP BOWEL PREP KIT) 17.5-3.13-1.6 GM/177ML SOLN Take 1 kit by mouth as directed. 03/30/17   Roger Lame, MD    Allergies as of 06/21/2017 - Review Complete 05/08/2017  Allergen Reaction Noted  . Morphine and related Nausea And Vomiting 09/28/2011  . Synalgos-dc [aspirin-caff-dihydrocodeine] Itching and Other (See Comments) 01/01/2013  . Synchlor a [chlorpheniramine] Rash and Other (See Comments) 10/14/2014    Family History  Problem Relation Age of Onset  . Heart disease Mother   . Heart disease Brother   . Prostate cancer Father   . Heart disease  Unknown   . Heart disease Brother   . Kidney disease Neg Hx   . Kidney cancer Neg Hx   . Bladder Cancer Neg Hx     Social History   Socioeconomic History  . Marital status: Married    Spouse name: Not on file  . Number of children: Not on file  . Years of education: Not on file  . Highest education level: Not on file  Occupational History  . Not on file  Social Needs  . Financial resource strain: Not on file  . Food insecurity:    Worry: Not on file    Inability: Not on file  . Transportation needs:    Medical: Not on file    Non-medical: Not on file  Tobacco Use  . Smoking status: Former Smoker    Packs/day: 0.50    Years: 15.00    Pack years: 7.50    Types: Cigarettes    Last attempt to quit: 05/18/1999    Years since quitting: 18.1  . Smokeless tobacco: Never Used  . Tobacco comment: quit 17 + years  Substance and Sexual Activity  . Alcohol use: No    Alcohol/week: 0.0 oz  . Drug use: No  . Sexual activity: Not on file  Lifestyle  . Physical activity:    Days per week: Not on file    Minutes per session: Not on file  . Stress: Not on file  Relationships  . Social connections:    Talks on phone: Not on file    Gets together: Not on file    Attends religious service: Not on file    Active member of club or organization: Not on file    Attends meetings of clubs or organizations: Not on file    Relationship status: Not on file  . Intimate partner violence:    Fear of current or ex partner: Not on file    Emotionally abused: Not on file    Physically abused: Not on file    Forced sexual activity: Not on file  Other Topics Concern  . Not  on file  Social History Narrative   ** Merged History Encounter **        Review of Systems: See HPI, otherwise negative ROS  Physical Exam: BP (!) 142/89   Pulse 61   Temp 97.9 F (36.6 C) (Temporal)   Ht _0  (1.905 m)   Wt 198 lb (89.8 kg)   SpO2 100%   BMI 24.75 kg/m  General:   Alert,  pleasant and  cooperative in NAD Head:  Normocephalic and atraumatic. Neck:  Supple; no masses or thyromegaly. Lungs:  Clear throughout to auscultation.    Heart:  Regular rate and rhythm. Abdomen:  Soft, nontender and nondistended. Normal bowel sounds, without guarding, and without rebound.   Neurologic:  Alert and  oriented x4;  grossly normal neurologically.  Impression/Plan: Roger Pruitt is here for an colonoscopy to be performed for history of colon polyps and anemia  Risks, benefits, limitations, and alternatives regarding  colonoscopy and egd have been reviewed with the patient.  Questions have been answered.  All parties agreeable.   Roger Lame, MD  06/29/2017, 7:28 AM

## 2017-06-29 NOTE — Anesthesia Postprocedure Evaluation (Signed)
Anesthesia Post Note  Patient: Roger Pruitt  Procedure(s) Performed: COLONOSCOPY WITH PROPOFOL (N/A ) ESOPHAGOGASTRODUODENOSCOPY (EGD) POLYPECTOMY INTESTINAL  Patient location during evaluation: PACU Anesthesia Type: General Level of consciousness: awake and alert, oriented and patient cooperative Pain management: pain level controlled Vital Signs Assessment: post-procedure vital signs reviewed and stable Respiratory status: spontaneous breathing, nonlabored ventilation and respiratory function stable Cardiovascular status: blood pressure returned to baseline and stable Postop Assessment: adequate PO intake Anesthetic complications: no    Darrin Nipper

## 2017-06-29 NOTE — Transfer of Care (Signed)
Immediate Anesthesia Transfer of Care Note  Patient: Roger Pruitt  Procedure(s) Performed: COLONOSCOPY WITH PROPOFOL (N/A ) ESOPHAGOGASTRODUODENOSCOPY (EGD) POLYPECTOMY INTESTINAL  Patient Location: PACU  Anesthesia Type: General  Level of Consciousness: awake, alert  and patient cooperative  Airway and Oxygen Therapy: Patient Spontanous Breathing and Patient connected to supplemental oxygen  Post-op Assessment: Post-op Vital signs reviewed, Patient's Cardiovascular Status Stable, Respiratory Function Stable, Patent Airway and No signs of Nausea or vomiting  Post-op Vital Signs: Reviewed and stable  Complications: No apparent anesthesia complications

## 2017-06-29 NOTE — Anesthesia Procedure Notes (Signed)
Procedure Name: MAC Performed by: Lind Guest, CRNA Pre-anesthesia Checklist: Patient identified, Emergency Drugs available, Suction available, Patient being monitored and Timeout performed Patient Re-evaluated:Patient Re-evaluated prior to induction Oxygen Delivery Method: Nasal cannula

## 2017-06-29 NOTE — Op Note (Signed)
[facility] Patient Name: Roger Pruitt Procedure Date: 06/29/2017 7:50 AM MRN: [id number] Account #: [id number] Date of Birth: [birth year] Admit Type: Outpatient Age: 67 Room: [facility] OR ROOM 1 Gender: Male Note Status: Finalized Procedure:            Upper GI endoscopy Indications:          Iron deficiency anemia Providers:            [name] MD, MD Referring MD:         Casilda Carls, MD (Referring MD) Medicines:            Propofol per Anesthesia Complications:        No immediate complications. Procedure:            Pre-Anesthesia Assessment:                       - Prior to the procedure, a History and Physical was                        performed, and patient medications and allergies were                        reviewed. The patient's tolerance of previous                        anesthesia was also reviewed. The risks and benefits of                        the procedure and the sedation options and risks were                        discussed with the patient. All questions were                        answered, and informed consent was obtained. Prior                        Anticoagulants: The patient has taken no previous                        anticoagulant or antiplatelet agents. ASA Grade                        Assessment: II - A patient with mild systemic disease.                        After reviewing the risks and benefits, the patient was                        deemed in satisfactory condition to undergo the                        procedure.                       After obtaining informed consent, the endoscope was                        passed under direct vision. Throughout the procedure,  the patient's blood pressure, pulse, and oxygen                        saturations were monitored continuously. The Olympus                        GIF H180J Endoscope (S#: [id number]) was introduced                        through  the mouth, and advanced to the second part of                        duodenum. The upper GI endoscopy was accomplished                        without difficulty. The patient tolerated the procedure                        well. Findings:      The Z-line was irregular and was found at the gastroesophageal junction.      Diffuse mild inflammation characterized by erythema was found in the       gastric antrum. Biopsies were taken with a cold forceps for histology.      The examined duodenum was normal. Impression:           - Z-line irregular, at the gastroesophageal junction.                       - Gastritis. Biopsied.                       - Normal examined duodenum. Recommendation:       - Discharge patient to home.                       - Resume previous diet.                       - Continue present medications.                       - Await pathology results.                       - Perform a colonoscopy today. Procedure Code(s):    --- Professional ---                       [phone], Esophagogastroduodenoscopy, flexible, transoral;                        with biopsy, single or multiple Diagnosis Code(s):    --- Professional ---                       D50.9, Iron deficiency anemia, unspecified                       K29.70, Gastritis, unspecified, without bleeding CPT copyright 2017 American Medical Association. All rights reserved. The codes documented in this report are preliminary and upon coder review may  be revised to meet current compliance requirements. [name] MD, MD 06/29/2017 8:01:39 AM This report has been signed electronically. Number of Addenda: 0  Note Initiated On: 06/29/2017 7:50 AM      [facility]

## 2017-06-30 ENCOUNTER — Ambulatory Visit (INDEPENDENT_AMBULATORY_CARE_PROVIDER_SITE_OTHER): Payer: Medicare Other

## 2017-06-30 ENCOUNTER — Encounter: Payer: Self-pay | Admitting: Gastroenterology

## 2017-06-30 VITALS — BP 145/88 | HR 82 | Ht 75.0 in | Wt 198.0 lb

## 2017-06-30 DIAGNOSIS — N390 Urinary tract infection, site not specified: Secondary | ICD-10-CM | POA: Diagnosis not present

## 2017-06-30 LAB — URINALYSIS, COMPLETE
Bilirubin, UA: NEGATIVE
Glucose, UA: NEGATIVE
KETONES UA: NEGATIVE
Nitrite, UA: NEGATIVE
PROTEIN UA: NEGATIVE
SPEC GRAV UA: 1.02 (ref 1.005–1.030)
Urobilinogen, Ur: 0.2 mg/dL (ref 0.2–1.0)
pH, UA: 7 (ref 5.0–7.5)

## 2017-06-30 LAB — MICROSCOPIC EXAMINATION

## 2017-06-30 NOTE — Progress Notes (Signed)
Pt presents today with c/o urinary frequency, urgency, dysuria, gross hematuria, and back pain. A clean catch sample was obtained for u/a and cx.  Blood pressure (!) 145/88, pulse 82, height 6\' 3"  (1.905 m), weight 198 lb (89.8 kg).

## 2017-07-04 ENCOUNTER — Telehealth: Payer: Self-pay | Admitting: Family Medicine

## 2017-07-04 LAB — CULTURE, URINE COMPREHENSIVE

## 2017-07-04 MED ORDER — NITROFURANTOIN MONOHYD MACRO 100 MG PO CAPS: 100.0000 mg | ORAL_CAPSULE | Freq: Two times a day (BID) | ORAL | 0 refills | Status: DC

## 2017-07-04 NOTE — Telephone Encounter (Signed)
Patient notified and RX sent to pharmacy.  

## 2017-07-04 NOTE — Telephone Encounter (Signed)
-----   Message from Nori Riis, PA-C sent at 07/04/2017  2:03 PM EDT ----- Patient has a positive urine culture.  He needs to start Macrobid 100 mg for seven days.

## 2017-07-05 ENCOUNTER — Encounter: Payer: Self-pay | Admitting: Gastroenterology

## 2017-07-10 ENCOUNTER — Encounter: Payer: Self-pay | Admitting: Gastroenterology

## 2017-07-12 ENCOUNTER — Telehealth: Payer: Self-pay | Admitting: *Deleted

## 2017-07-12 NOTE — Telephone Encounter (Signed)
Pt and spouse are both requesting refill on Combivent. Our office has never prescriber nor is it mentioned to continue in last ov note. Will have to get provider approval.

## 2017-07-12 NOTE — Telephone Encounter (Signed)
Patient called and states he needs a refill of his Combivent. Patient pharmacy is [facility] in [place]. Please advise. Thank you

## 2017-07-17 DIAGNOSIS — M48062 Spinal stenosis, lumbar region with neurogenic claudication: Secondary | ICD-10-CM | POA: Diagnosis not present

## 2017-07-17 DIAGNOSIS — M5126 Other intervertebral disc displacement, lumbar region: Secondary | ICD-10-CM | POA: Diagnosis not present

## 2017-07-17 DIAGNOSIS — M5416 Radiculopathy, lumbar region: Secondary | ICD-10-CM | POA: Diagnosis not present

## 2017-07-20 ENCOUNTER — Encounter: Payer: Self-pay | Admitting: Emergency Medicine

## 2017-07-20 ENCOUNTER — Ambulatory Visit
Admission: EM | Admit: 2017-07-20 | Discharge: 2017-07-20 | Disposition: A | Discharge status: Home / Self Care | Payer: Medicare Other | Attending: Family Medicine | Admitting: Family Medicine

## 2017-07-20 ENCOUNTER — Telehealth: Payer: Self-pay | Admitting: Gastroenterology

## 2017-07-20 ENCOUNTER — Other Ambulatory Visit: Payer: Self-pay

## 2017-07-20 DIAGNOSIS — F319 Bipolar disorder, unspecified: Secondary | ICD-10-CM | POA: Insufficient documentation

## 2017-07-20 DIAGNOSIS — M545 Low back pain, unspecified: Secondary | ICD-10-CM

## 2017-07-20 DIAGNOSIS — Z8673 Personal history of transient ischemic attack (TIA), and cerebral infarction without residual deficits: Secondary | ICD-10-CM | POA: Diagnosis not present

## 2017-07-20 DIAGNOSIS — Z79899 Other long term (current) drug therapy: Secondary | ICD-10-CM | POA: Insufficient documentation

## 2017-07-20 DIAGNOSIS — Z7982 Long term (current) use of aspirin: Secondary | ICD-10-CM | POA: Diagnosis not present

## 2017-07-20 DIAGNOSIS — Z87891 Personal history of nicotine dependence: Secondary | ICD-10-CM | POA: Insufficient documentation

## 2017-07-20 DIAGNOSIS — G20 Parkinson's disease: Secondary | ICD-10-CM | POA: Diagnosis not present

## 2017-07-20 DIAGNOSIS — G4730 Sleep apnea, unspecified: Secondary | ICD-10-CM | POA: Insufficient documentation

## 2017-07-20 DIAGNOSIS — J45909 Unspecified asthma, uncomplicated: Secondary | ICD-10-CM | POA: Insufficient documentation

## 2017-07-20 DIAGNOSIS — F429 Obsessive-compulsive disorder, unspecified: Secondary | ICD-10-CM | POA: Diagnosis not present

## 2017-07-20 DIAGNOSIS — E559 Vitamin D deficiency, unspecified: Secondary | ICD-10-CM | POA: Diagnosis not present

## 2017-07-20 DIAGNOSIS — E1140 Type 2 diabetes mellitus with diabetic neuropathy, unspecified: Secondary | ICD-10-CM | POA: Diagnosis not present

## 2017-07-20 DIAGNOSIS — I2510 Atherosclerotic heart disease of native coronary artery without angina pectoris: Secondary | ICD-10-CM | POA: Diagnosis not present

## 2017-07-20 DIAGNOSIS — N35919 Unspecified urethral stricture, male, unspecified site: Secondary | ICD-10-CM | POA: Diagnosis not present

## 2017-07-20 DIAGNOSIS — G5600 Carpal tunnel syndrome, unspecified upper limb: Secondary | ICD-10-CM | POA: Insufficient documentation

## 2017-07-20 DIAGNOSIS — I252 Old myocardial infarction: Secondary | ICD-10-CM | POA: Diagnosis not present

## 2017-07-20 DIAGNOSIS — Z886 Allergy status to analgesic agent status: Secondary | ICD-10-CM | POA: Insufficient documentation

## 2017-07-20 DIAGNOSIS — F410 Panic disorder [episodic paroxysmal anxiety] without agoraphobia: Secondary | ICD-10-CM | POA: Diagnosis not present

## 2017-07-20 DIAGNOSIS — N400 Enlarged prostate without lower urinary tract symptoms: Secondary | ICD-10-CM | POA: Diagnosis not present

## 2017-07-20 DIAGNOSIS — I119 Hypertensive heart disease without heart failure: Secondary | ICD-10-CM | POA: Diagnosis not present

## 2017-07-20 DIAGNOSIS — Z951 Presence of aortocoronary bypass graft: Secondary | ICD-10-CM | POA: Insufficient documentation

## 2017-07-20 DIAGNOSIS — Z955 Presence of coronary angioplasty implant and graft: Secondary | ICD-10-CM | POA: Insufficient documentation

## 2017-07-20 DIAGNOSIS — Z885 Allergy status to narcotic agent status: Secondary | ICD-10-CM | POA: Insufficient documentation

## 2017-07-20 DIAGNOSIS — Z87440 Personal history of urinary (tract) infections: Secondary | ICD-10-CM | POA: Insufficient documentation

## 2017-07-20 DIAGNOSIS — E785 Hyperlipidemia, unspecified: Secondary | ICD-10-CM | POA: Diagnosis not present

## 2017-07-20 DIAGNOSIS — K219 Gastro-esophageal reflux disease without esophagitis: Secondary | ICD-10-CM | POA: Insufficient documentation

## 2017-07-20 LAB — URINALYSIS, COMPLETE (UACMP) WITH MICROSCOPIC
BILIRUBIN URINE: NEGATIVE
Bacteria, UA: NONE SEEN
Glucose, UA: NEGATIVE mg/dL
KETONES UR: NEGATIVE mg/dL
NITRITE: NEGATIVE
Protein, ur: NEGATIVE mg/dL
Specific Gravity, Urine: 1.01 (ref 1.005–1.030)
pH: 7 (ref 5.0–8.0)

## 2017-07-20 MED ORDER — TRAMADOL HCL 50 MG PO TABS: 50.0000 mg | ORAL_TABLET | Freq: Three times a day (TID) | ORAL | 0 refills | Status: DC | PRN

## 2017-07-20 NOTE — Discharge Instructions (Signed)
Rest.   Tramadol for pain.  Take care  Dr. Lacinda Axon

## 2017-07-20 NOTE — Telephone Encounter (Signed)
Patient called to schedule his 3 month EGD with Dr. Allen Norris. Please call

## 2017-07-20 NOTE — ED Provider Notes (Signed)
[facility]    CSN: [id number] Arrival date & time: 07/20/17  0955  History   Chief Complaint Chief Complaint  Patient presents with  . Back Pain   HPI  67 year old male presents with back pain.  Started last night.  Started abruptly.  Bilateral, low back.  Pain is severe.  Patient reports ongoing issues with difficulty urinating but this is a chronic problem.  Patient is concerned that he may have a urinary tract infection or a "kidney infection".  He has had no fever.  He does states that it is difficult to urinate and he has pain with urination.  Patient states that his back pain worsens with activity/range of motion.  No relieving factors.  No other associated notes.  No other complaints.  Past Medical History:  Diagnosis Date  . Abdominal pain   . Acute cystitis without hematuria   . Anemia   . Anginal pain ([place])   . Arthritis   . Asthma   . Bipolar disorder ([place])    BIPOLAR/ SCHIZO/ AFFECTIVE   . BPD (bronchopulmonary dysplasia)   . BPH (benign prostatic hyperplasia)   . CAD (coronary artery disease)    a. s/p MI 2001, 2012;  b. s/p prior LCX stenting; b. 05/2012 Abnl MV; c. 05/2012 Cath: patent LCX stent w/ otw mod non-obs dzs->Med Rx.  . Carpal tunnel syndrome   . Depression   . Disc degeneration, lumbar   . Dyspnea    WITH EXERTION  . Elevated PSA   . Esophageal reflux   . GERD (gastroesophageal reflux disease)   . Hematuria    history of gross and microscopic  . Hemorrhoid   . History of balanitis   . History of bladder infections   . History of echocardiogram    a. 07/2014 Echo: EF 60-65%, no rwma, nl LA size, nl RV/PASP.  Marland Kitchen History of urethral stricture   . Hyperlipidemia   . Hypertension   . Hypertensive heart disease   . Incomplete bladder emptying   . Iron deficiency anemia, unspecified   . Lower urinary tract symptoms   . Myocardial infarction ([place])    X7 LAST 2001  . Neuropathy   . Nocturia   . Obsessive compulsive disorder   .  Other dysphagia   . Panic attack   . Parkinson's disease ([place])   . Sleep apnea    SURGICAL TX  . Stroke ([place])   . Type II diabetes mellitus ([place])   . Unspecified vitamin D deficiency   . Urethral stricture   . Urinary retention   . Varicose vein of leg   . Vitamin D deficiency     Patient Active Problem List   Diagnosis Date Noted  . Iron deficiency anemia due to chronic blood loss   . Benign neoplasm of ascending colon   . Benign neoplasm of cecum   . Gastritis without bleeding   . Parkinsonism due to drug ([place]) 07/22/2016  . Chronic bilateral low back pain without sciatica 04/07/2016  . Syncope 12/31/2015  . Status post coronary artery bypass grafting 12/31/2015  . S/P CABG x 4 12/25/2015  . Unstable angina ([place]) 12/01/2015  . Hypertensive heart disease   . Type II diabetes mellitus ([place])   . Abnormal chest CT 07/10/2015  . Pulmonary nodules 07/09/2015  . Basal cell carcinoma of skin 05/27/2015  . Bipolar disorder ([place]) 05/27/2015  . CAD (coronary artery disease), native coronary artery 05/27/2015  . DDD (degenerative disc disease), lumbosacral 05/27/2015  .  Esophageal reflux 05/27/2015  . Benign essential hypertension 05/27/2015  . Sleep apnea 05/27/2015  . Urethral stricture 05/27/2015  . Benign prostatic hypertrophy (BPH) with incomplete bladder emptying 11/29/2014  . CVA (cerebral infarction) 08/23/2014  . Hypertension   . S/P coronary artery stent placement 09/30/2011  . Hyperlipidemia 09/30/2011  . History of myocardial infarction 09/03/2011  . Seasonal allergies 09/03/2011    Past Surgical History:  Procedure Laterality Date  . BACK SURGERY     lumbar disc  . BACK SURGERY    . CARDIAC CATHETERIZATION  6/10;2011;Aug.2012;Oct. 2012;   . CARDIAC CATHETERIZATION  05/2012   [facility]; EF 60%, patent stent in the left circumflex, moderate three-vessel disease with no flow-limiting lesions. Unchanged from most recent catheterization  . CARDIAC CATHETERIZATION N/A  12/10/2015   Procedure: Left Heart Cath and Coronary Angiography;  Surgeon: Corey Skains, MD;  Location: [facility];  Service: Cardiovascular;  Laterality: N/A;  . CARPAL TUNNEL RELEASE  2009   left  . CATARACT EXTRACTION W/PHACO Left 02/14/2017   Procedure: CATARACT EXTRACTION PHACO AND INTRAOCULAR LENS PLACEMENT (IOC);  Surgeon: Birder Robson, MD;  Location: [facility];  Service: Ophthalmology;  Laterality: Left;  [place] 00:42 AP% 16.6 CDE 7.14 Fluid pack lot # [id number]  . CATARACT EXTRACTION W/PHACO Right 02/28/2017   Procedure: CATARACT EXTRACTION PHACO AND INTRAOCULAR LENS PLACEMENT (IOC);  Surgeon: Birder Robson, MD;  Location: [facility];  Service: Ophthalmology;  Laterality: Right;  [place] 01:09 AP% 11.4 CDE 7.98 Fluid pack lot # [id number]  . COLONOSCOPY WITH PROPOFOL N/A 06/29/2017   Procedure: COLONOSCOPY WITH PROPOFOL;  Surgeon: [name], MD;  Location: [facility];  Service: Endoscopy;  Laterality: N/A;  Diabetic - oral meds  . CORONARY ANGIOPLASTY  2011 & 2001   s/p stent  . CORONARY ARTERY BYPASS GRAFT     stent placement  . CORONARY ARTERY BYPASS GRAFT N/A 12/25/2015   Procedure: CORONARY ARTERY BYPASS GRAFTING (CABG) x4  , using left internal mammary artery and right leg greater saphenous vein harvested endoscopically LIMA-LAD SVG-OM2 SVG-DIAG SVG-PD;  Surgeon: Melrose Nakayama, MD;  Location: [facility];  Service: Open Heart Surgery;  Laterality: N/A;  . [place]  . CYST REMOVAL LEG Right   . ESOPHAGOGASTRODUODENOSCOPY  06/29/2017   Procedure: ESOPHAGOGASTRODUODENOSCOPY (EGD);  Surgeon: [name], MD;  Location: [facility];  Service: Endoscopy;;  . POLYPECTOMY  06/29/2017   Procedure: POLYPECTOMY INTESTINAL;  Surgeon: [name], MD;  Location: [facility];  Service: Endoscopy;;  . SKIN GRAFT    . TEE WITHOUT CARDIOVERSION N/A 12/25/2015   Procedure: TRANSESOPHAGEAL ECHOCARDIOGRAM (TEE);  Surgeon: Melrose Nakayama, MD;   Location: [facility];  Service: Open Heart Surgery;  Laterality: N/A;  . TONSILLECTOMY    . UVULOPALATOPHARYNGOPLASTY         Home Medications    Prior to Admission medications   Medication Sig Start Date End Date Taking? Authorizing Provider  acetaminophen (TYLENOL) 500 MG tablet Take 1,000 mg by mouth every 6 (six) hours as needed for moderate pain or headache.    Yes [provider]  amLODipine (NORVASC) 10 MG tablet Take 10 mg by mouth daily.  10/06/16  Yes [provider]  ASPIRIN 81 PO Take by mouth daily.   Yes [provider]  atorvastatin (LIPITOR) 40 MG tablet Take 1 tablet (40 mg total) by mouth daily at 6 PM. 12/30/15  Yes Barrett, Erin R, PA-C  budesonide-formoterol (SYMBICORT) 160-4.5 MCG/ACT inhaler Inhale  2 puffs into the lungs 2 (two) times daily. 09/16/16  Yes Kasa, Maretta Bees, MD  buPROPion (WELLBUTRIN XL) 300 MG 24 hr tablet Take 300 mg by mouth daily.    Yes [provider]  carvedilol (COREG) 12.5 MG tablet Take 12.5 mg by mouth 2 (two) times daily.  09/27/16  Yes [provider]  doxepin (SINEQUAN) 25 MG capsule Take 50 mg daily by mouth.  08/02/16  Yes [provider]  DUREZOL 0.05 % EMUL Place 1 drop into the left eye at bedtime. 02/03/17  Yes [provider]  fluticasone (FLONASE) 50 MCG/ACT nasal spray Place 2 sprays into both nostrils daily.    Yes [provider]  Ipratropium-Albuterol (COMBIVENT RESPIMAT) 20-100 MCG/ACT AERS respimat Inhale 2 puffs into the lungs every 6 (six) hours as needed for wheezing or shortness of breath.   Yes [provider]  JANUMET 50-1000 MG tablet Take 1 tablet by mouth 2 (two) times daily. 08/02/16  Yes [provider]  lisinopril (PRINIVIL,ZESTRIL) 20 MG tablet Take 20 mg 2 (two) times daily by mouth.  08/02/16  Yes [provider]  LORazepam (ATIVAN) 1 MG tablet Take 1-2 tablets (1-2 mg total) by mouth 2 (two) times daily. Pt takes one tablet in the  morning and two at bedtime. Patient taking differently: Take 1-2 mg by mouth See admin instructions. Take 1 mg by mouth in the morning and take 2 mg by mouth at bedtime 01/04/16  Yes Gladstone Lighter, MD  nitroGLYCERIN (NITROLINGUAL) 0.4 MG/SPRAY spray Place 1 spray under the tongue every 5 (five) minutes x 3 doses as needed for chest pain.   Yes [provider]  pantoprazole (PROTONIX) 40 MG tablet Take 40 mg by mouth daily.    Yes [provider]  risperidone (RISPERDAL) 4 MG tablet Take 4 mg by mouth at bedtime.  08/02/16  Yes [provider]  tamsulosin (FLOMAX) 0.4 MG CAPS capsule Take 1 capsule (0.4 mg total) by mouth daily. 05/08/17  Yes McGowan, Larene Beach A, PA-C  topiramate (TOPAMAX) 100 MG tablet Take 100 mg by mouth daily.    Yes [provider]  gabapentin (NEURONTIN) 300 MG capsule Take 300 mg 3 (three) times daily by mouth.  08/02/16   [provider]  Incontinence Supply Disposable (DEPEND ADJUSTABLE UNDERWEAR LG) MISC Wear pads tid 09/19/16   McGowan, Larene Beach A, PA-C  Na Sulfate-K Sulfate-Mg Sulf (SUPREP BOWEL PREP KIT) 17.5-3.13-1.6 GM/177ML SOLN Take 1 kit by mouth as directed. 03/30/17   [name], MD  traMADol (ULTRAM) 50 MG tablet Take 1 tablet (50 mg total) by mouth every 8 (eight) hours as needed. 07/20/17   Coral Spikes, DO    Family History Family History  Problem Relation Age of Onset  . Heart disease Mother   . Heart disease Brother   . Prostate cancer Father   . Heart disease Unknown   . Heart disease Brother   . Kidney disease Neg Hx   . Kidney cancer Neg Hx   . Bladder Cancer Neg Hx     Social History Social History   Tobacco Use  . Smoking status: Former Smoker    Packs/day: 0.50    Years: 15.00    Pack years: 7.50    Types: Cigarettes    Last attempt to quit: 05/18/1999    Years since quitting: 18.1  . Smokeless tobacco: Never Used  . Tobacco comment: quit 17 + years  Substance Use Topics  . Alcohol use: No  Alcohol/week: 0.0 oz  . Drug use: No     Allergies   Morphine and related; Synalgos-dc [aspirin-caff-dihydrocodeine]; and Synchlor a [chlorpheniramine]   Review of Systems Review of Systems  Constitutional: Negative for fever.  Genitourinary: Positive for difficulty urinating.  Musculoskeletal: Positive for back pain.   Physical Exam Triage Vital Signs ED Triage Vitals  Enc Vitals Group     BP 07/20/17 1012 (!) 152/99     Pulse Rate 07/20/17 1012 65     Resp 07/20/17 1012 16     Temp 07/20/17 1012 (!) 97.5 F (36.4 C)     Temp Source 07/20/17 1012 Oral     SpO2 07/20/17 1012 100 %     Weight 07/20/17 1007 198 lb (89.8 kg)     Height 07/20/17 1007 _0  (1.905 m)     Head Circumference --      Peak Flow --      Pain Score 07/20/17 1006 7     Pain Loc --      Pain Edu? --      Excl. in [place]? --    Updated Vital Signs BP (!) 152/99 (BP Location: Right Arm)   Pulse 65   Temp (!) 97.5 F (36.4 C) (Oral)   Resp 16   Ht _1  (1.905 m)   Wt 198 lb (89.8 kg)   SpO2 100%   BMI 24.75 kg/m   Physical Exam  Constitutional: He appears well-developed. No distress.  Cardiovascular: Normal rate and regular rhythm.  Pulmonary/Chest: Effort normal and breath sounds normal. He has no wheezes. He has no rales.  Abdominal: Soft. He exhibits no distension.  Musculoskeletal:  Patient with paraspinal muscular tenderness to palpation, lumbar spine.  Neurological: He is alert.  Nursing note and vitals reviewed.  UC Treatments / Results  Labs (all labs ordered are listed, but only abnormal results are displayed) Labs Reviewed  URINALYSIS, COMPLETE (UACMP) WITH MICROSCOPIC - Abnormal; Notable for the following components:      Result Value   Hgb urine dipstick TRACE (*)    Leukocytes, UA TRACE (*)    All other components within normal limits  URINE CULTURE    EKG None Radiology No results found.  Procedures Procedures (including critical care time)  Medications  Ordered in UC Medications - No data to display   Initial Impression / Assessment and Plan / UC Course  I have reviewed the triage vital signs and the nursing notes.  Pertinent labs & imaging results that were available during my care of the patient were reviewed by me and considered in my medical decision making (see chart for details).     67 year old male presents with low back pain.  Doubt UTI.  Seems to be musculoskeletal.  Treating with tramadol.  Final Clinical Impressions(s) / UC Diagnoses   Final diagnoses:  Acute bilateral low back pain without sciatica    ED Discharge Orders        Ordered    traMADol (ULTRAM) 50 MG tablet  Every 8 hours PRN     07/20/17 1047     Controlled Substance Prescriptions [place] Controlled Substance Registry consulted? Not Applicable   Coral Spikes, DO 07/20/17 1140

## 2017-07-20 NOTE — ED Triage Notes (Signed)
Patient states he is having lower back pain and kidney pain. Patient states he was treated for a UTI but doesn't think he has gotten better

## 2017-07-21 LAB — URINE CULTURE

## 2017-07-22 NOTE — Progress Notes (Signed)
Urine culture does not clearly demonstrate a UTI. Pt called and made aware. May need to consider alternate cause of urinary symptoms, such as dietary cause (caffeine); bowel issue (constipation); kidney stone passage; or prostate issue. Recheck or followup with a urologist for further evaluation if symptoms are not improving. Pt verbalized understanding.  

## 2017-07-25 ENCOUNTER — Telehealth: Payer: Self-pay | Admitting: Gastroenterology

## 2017-07-25 NOTE — Telephone Encounter (Signed)
Pt is calling to set up EDG he states Dr. Allen Norris wants him to do it every 3 month

## 2017-07-25 NOTE — Telephone Encounter (Signed)
Patient asked for a return call in 45 minutes.

## 2017-07-25 NOTE — Telephone Encounter (Signed)
LVM for pt to return my call.

## 2017-07-26 ENCOUNTER — Telehealth: Payer: Self-pay | Admitting: Gastroenterology

## 2017-07-26 ENCOUNTER — Other Ambulatory Visit: Payer: Self-pay

## 2017-07-26 DIAGNOSIS — R933 Abnormal findings on diagnostic imaging of other parts of digestive tract: Secondary | ICD-10-CM

## 2017-07-26 NOTE — Telephone Encounter (Signed)
Pt returned my call but I was out of the office. Called pt back but vm was not set up. Not able to leave message.

## 2017-07-26 NOTE — Telephone Encounter (Signed)
PT LEFT VM  FOR GINGER TO SCHEDULE EGD

## 2017-07-26 NOTE — Telephone Encounter (Signed)
Pt scheduled for repeat EGD at [facility] on 10/05/17.

## 2017-07-27 DIAGNOSIS — M48061 Spinal stenosis, lumbar region without neurogenic claudication: Secondary | ICD-10-CM | POA: Diagnosis not present

## 2017-07-27 DIAGNOSIS — M6281 Muscle weakness (generalized): Secondary | ICD-10-CM | POA: Diagnosis not present

## 2017-08-09 DIAGNOSIS — N400 Enlarged prostate without lower urinary tract symptoms: Secondary | ICD-10-CM | POA: Diagnosis not present

## 2017-08-09 DIAGNOSIS — D509 Iron deficiency anemia, unspecified: Secondary | ICD-10-CM | POA: Diagnosis not present

## 2017-08-09 DIAGNOSIS — J449 Chronic obstructive pulmonary disease, unspecified: Secondary | ICD-10-CM | POA: Diagnosis not present

## 2017-08-09 DIAGNOSIS — R739 Hyperglycemia, unspecified: Secondary | ICD-10-CM | POA: Diagnosis not present

## 2017-08-09 DIAGNOSIS — I10 Essential (primary) hypertension: Secondary | ICD-10-CM | POA: Diagnosis not present

## 2017-08-09 DIAGNOSIS — F319 Bipolar disorder, unspecified: Secondary | ICD-10-CM | POA: Diagnosis not present

## 2017-08-09 DIAGNOSIS — E785 Hyperlipidemia, unspecified: Secondary | ICD-10-CM | POA: Diagnosis not present

## 2017-08-10 ENCOUNTER — Ambulatory Visit: Payer: Medicare Other | Admitting: Internal Medicine

## 2017-08-20 ENCOUNTER — Ambulatory Visit
Admission: EM | Admit: 2017-08-20 | Discharge: 2017-08-20 | Disposition: A | Discharge status: Home / Self Care | Payer: Medicare Other | Attending: Family Medicine | Admitting: Family Medicine

## 2017-08-20 ENCOUNTER — Other Ambulatory Visit: Payer: Self-pay

## 2017-08-20 DIAGNOSIS — Z885 Allergy status to narcotic agent status: Secondary | ICD-10-CM | POA: Insufficient documentation

## 2017-08-20 DIAGNOSIS — Z79899 Other long term (current) drug therapy: Secondary | ICD-10-CM | POA: Diagnosis not present

## 2017-08-20 DIAGNOSIS — R319 Hematuria, unspecified: Secondary | ICD-10-CM

## 2017-08-20 DIAGNOSIS — J45909 Unspecified asthma, uncomplicated: Secondary | ICD-10-CM | POA: Insufficient documentation

## 2017-08-20 DIAGNOSIS — E1140 Type 2 diabetes mellitus with diabetic neuropathy, unspecified: Secondary | ICD-10-CM | POA: Insufficient documentation

## 2017-08-20 DIAGNOSIS — G20 Parkinson's disease: Secondary | ICD-10-CM | POA: Insufficient documentation

## 2017-08-20 DIAGNOSIS — I252 Old myocardial infarction: Secondary | ICD-10-CM | POA: Insufficient documentation

## 2017-08-20 DIAGNOSIS — N390 Urinary tract infection, site not specified: Secondary | ICD-10-CM | POA: Diagnosis not present

## 2017-08-20 DIAGNOSIS — Z9841 Cataract extraction status, right eye: Secondary | ICD-10-CM | POA: Insufficient documentation

## 2017-08-20 DIAGNOSIS — E559 Vitamin D deficiency, unspecified: Secondary | ICD-10-CM | POA: Insufficient documentation

## 2017-08-20 DIAGNOSIS — Z7951 Long term (current) use of inhaled steroids: Secondary | ICD-10-CM | POA: Insufficient documentation

## 2017-08-20 DIAGNOSIS — G4730 Sleep apnea, unspecified: Secondary | ICD-10-CM | POA: Insufficient documentation

## 2017-08-20 DIAGNOSIS — Z951 Presence of aortocoronary bypass graft: Secondary | ICD-10-CM | POA: Diagnosis not present

## 2017-08-20 DIAGNOSIS — I2510 Atherosclerotic heart disease of native coronary artery without angina pectoris: Secondary | ICD-10-CM | POA: Diagnosis not present

## 2017-08-20 DIAGNOSIS — I119 Hypertensive heart disease without heart failure: Secondary | ICD-10-CM | POA: Diagnosis not present

## 2017-08-20 DIAGNOSIS — K219 Gastro-esophageal reflux disease without esophagitis: Secondary | ICD-10-CM | POA: Insufficient documentation

## 2017-08-20 DIAGNOSIS — Z7984 Long term (current) use of oral hypoglycemic drugs: Secondary | ICD-10-CM | POA: Diagnosis not present

## 2017-08-20 DIAGNOSIS — Z7982 Long term (current) use of aspirin: Secondary | ICD-10-CM | POA: Diagnosis not present

## 2017-08-20 DIAGNOSIS — Z8042 Family history of malignant neoplasm of prostate: Secondary | ICD-10-CM | POA: Insufficient documentation

## 2017-08-20 DIAGNOSIS — Z8673 Personal history of transient ischemic attack (TIA), and cerebral infarction without residual deficits: Secondary | ICD-10-CM | POA: Diagnosis not present

## 2017-08-20 DIAGNOSIS — Z87440 Personal history of urinary (tract) infections: Secondary | ICD-10-CM | POA: Insufficient documentation

## 2017-08-20 DIAGNOSIS — F319 Bipolar disorder, unspecified: Secondary | ICD-10-CM | POA: Insufficient documentation

## 2017-08-20 DIAGNOSIS — M5137 Other intervertebral disc degeneration, lumbosacral region: Secondary | ICD-10-CM | POA: Insufficient documentation

## 2017-08-20 DIAGNOSIS — E785 Hyperlipidemia, unspecified: Secondary | ICD-10-CM | POA: Diagnosis not present

## 2017-08-20 DIAGNOSIS — Z86010 Personal history of colonic polyps: Secondary | ICD-10-CM | POA: Insufficient documentation

## 2017-08-20 DIAGNOSIS — Z955 Presence of coronary angioplasty implant and graft: Secondary | ICD-10-CM | POA: Insufficient documentation

## 2017-08-20 DIAGNOSIS — Z87891 Personal history of nicotine dependence: Secondary | ICD-10-CM | POA: Diagnosis not present

## 2017-08-20 DIAGNOSIS — Z886 Allergy status to analgesic agent status: Secondary | ICD-10-CM | POA: Diagnosis not present

## 2017-08-20 DIAGNOSIS — G8929 Other chronic pain: Secondary | ICD-10-CM | POA: Insufficient documentation

## 2017-08-20 DIAGNOSIS — R300 Dysuria: Secondary | ICD-10-CM | POA: Diagnosis present

## 2017-08-20 DIAGNOSIS — Z9842 Cataract extraction status, left eye: Secondary | ICD-10-CM | POA: Insufficient documentation

## 2017-08-20 DIAGNOSIS — Z85828 Personal history of other malignant neoplasm of skin: Secondary | ICD-10-CM | POA: Insufficient documentation

## 2017-08-20 DIAGNOSIS — Z961 Presence of intraocular lens: Secondary | ICD-10-CM | POA: Insufficient documentation

## 2017-08-20 DIAGNOSIS — Z8249 Family history of ischemic heart disease and other diseases of the circulatory system: Secondary | ICD-10-CM | POA: Diagnosis not present

## 2017-08-20 LAB — URINALYSIS, COMPLETE (UACMP) WITH MICROSCOPIC
Bilirubin Urine: NEGATIVE
GLUCOSE, UA: NEGATIVE mg/dL
Ketones, ur: NEGATIVE mg/dL
NITRITE: NEGATIVE
PH: 5.5 (ref 5.0–8.0)
Protein, ur: NEGATIVE mg/dL
Specific Gravity, Urine: 1.01 (ref 1.005–1.030)
WBC, UA: >50 WBC/hpf (ref 0–5)

## 2017-08-20 MED ORDER — NITROFURANTOIN MONOHYD MACRO 100 MG PO CAPS: 100.0000 mg | ORAL_CAPSULE | Freq: Two times a day (BID) | ORAL | 0 refills | Status: DC

## 2017-08-20 NOTE — ED Provider Notes (Signed)
[facility]    CSN: [id number] Arrival date & time: 08/20/17  1530     History   Chief Complaint Chief Complaint  Patient presents with  . Dysuria    HPI Roger Pruitt is a 67 y.o. male.    Dysuria  This is a new problem. The current episode started more than 2 days ago. The problem occurs constantly. The problem has not changed since onset.Pertinent negatives include no chest pain, no abdominal pain, no headaches and no shortness of breath. He has tried nothing for the symptoms.    Past Medical History:  Diagnosis Date  . Abdominal pain   . Acute cystitis without hematuria   . Anemia   . Anginal pain ([place])   . Arthritis   . Asthma   . Bipolar disorder ([place])    BIPOLAR/ SCHIZO/ AFFECTIVE   . BPD (bronchopulmonary dysplasia)   . BPH (benign prostatic hyperplasia)   . CAD (coronary artery disease)    a. s/p MI 2001, 2012;  b. s/p prior LCX stenting; b. 05/2012 Abnl MV; c. 05/2012 Cath: patent LCX stent w/ otw mod non-obs dzs->Med Rx.  . Carpal tunnel syndrome   . Depression   . Disc degeneration, lumbar   . Dyspnea    WITH EXERTION  . Elevated PSA   . Esophageal reflux   . GERD (gastroesophageal reflux disease)   . Hematuria    history of gross and microscopic  . Hemorrhoid   . History of balanitis   . History of bladder infections   . History of echocardiogram    a. 07/2014 Echo: EF 60-65%, no rwma, nl LA size, nl RV/PASP.  Marland Kitchen History of urethral stricture   . Hyperlipidemia   . Hypertension   . Hypertensive heart disease   . Incomplete bladder emptying   . Iron deficiency anemia, unspecified   . Lower urinary tract symptoms   . Myocardial infarction ([place])    X7 LAST 2001  . Neuropathy   . Nocturia   . Obsessive compulsive disorder   . Other dysphagia   . Panic attack   . Parkinson's disease ([place])   . Sleep apnea    SURGICAL TX  . Stroke ([place])   . Type II diabetes mellitus ([place])   . Unspecified vitamin D deficiency   . Urethral  stricture   . Urinary retention   . Varicose vein of leg   . Vitamin D deficiency     Patient Active Problem List   Diagnosis Date Noted  . Iron deficiency anemia due to chronic blood loss   . Benign neoplasm of ascending colon   . Benign neoplasm of cecum   . Gastritis without bleeding   . Parkinsonism due to drug ([place]) 07/22/2016  . Chronic bilateral low back pain without sciatica 04/07/2016  . Syncope 12/31/2015  . Status post coronary artery bypass grafting 12/31/2015  . S/P CABG x 4 12/25/2015  . Unstable angina ([place]) 12/01/2015  . Hypertensive heart disease   . Type II diabetes mellitus ([place])   . Abnormal chest CT 07/10/2015  . Pulmonary nodules 07/09/2015  . Basal cell carcinoma of skin 05/27/2015  . Bipolar disorder ([place]) 05/27/2015  . CAD (coronary artery disease), native coronary artery 05/27/2015  . DDD (degenerative disc disease), lumbosacral 05/27/2015  . Esophageal reflux 05/27/2015  . Benign essential hypertension 05/27/2015  . Sleep apnea 05/27/2015  . Urethral stricture 05/27/2015  . Benign prostatic hypertrophy (BPH) with incomplete bladder emptying 11/29/2014  .  CVA (cerebral infarction) 08/23/2014  . Hypertension   . S/P coronary artery stent placement 09/30/2011  . Hyperlipidemia 09/30/2011  . History of myocardial infarction 09/03/2011  . Seasonal allergies 09/03/2011    Past Surgical History:  Procedure Laterality Date  . BACK SURGERY     lumbar disc  . BACK SURGERY    . CARDIAC CATHETERIZATION  6/10;2011;Aug.2012;Oct. 2012;   . CARDIAC CATHETERIZATION  05/2012   [facility]; EF 60%, patent stent in the left circumflex, moderate three-vessel disease with no flow-limiting lesions. Unchanged from most recent catheterization  . CARDIAC CATHETERIZATION N/A 12/10/2015   Procedure: Left Heart Cath and Coronary Angiography;  Surgeon: Corey Skains, MD;  Location: [facility];  Service: Cardiovascular;  Laterality: N/A;  . CARPAL TUNNEL RELEASE  2009    left  . CATARACT EXTRACTION W/PHACO Left 02/14/2017   Procedure: CATARACT EXTRACTION PHACO AND INTRAOCULAR LENS PLACEMENT (IOC);  Surgeon: Birder Robson, MD;  Location: [facility];  Service: Ophthalmology;  Laterality: Left;  [place] 00:42 AP% 16.6 CDE 7.14 Fluid pack lot # [id number]  . CATARACT EXTRACTION W/PHACO Right 02/28/2017   Procedure: CATARACT EXTRACTION PHACO AND INTRAOCULAR LENS PLACEMENT (IOC);  Surgeon: Birder Robson, MD;  Location: [facility];  Service: Ophthalmology;  Laterality: Right;  [place] 01:09 AP% 11.4 CDE 7.98 Fluid pack lot # [id number]  . COLONOSCOPY WITH PROPOFOL N/A 06/29/2017   Procedure: COLONOSCOPY WITH PROPOFOL;  Surgeon: [name], MD;  Location: [facility];  Service: Endoscopy;  Laterality: N/A;  Diabetic - oral meds  . CORONARY ANGIOPLASTY  2011 & 2001   s/p stent  . CORONARY ARTERY BYPASS GRAFT     stent placement  . CORONARY ARTERY BYPASS GRAFT N/A 12/25/2015   Procedure: CORONARY ARTERY BYPASS GRAFTING (CABG) x4  , using left internal mammary artery and right leg greater saphenous vein harvested endoscopically LIMA-LAD SVG-OM2 SVG-DIAG SVG-PD;  Surgeon: Melrose Nakayama, MD;  Location: [facility];  Service: Open Heart Surgery;  Laterality: N/A;  . [place]  . CYST REMOVAL LEG Right   . ESOPHAGOGASTRODUODENOSCOPY  06/29/2017   Procedure: ESOPHAGOGASTRODUODENOSCOPY (EGD);  Surgeon: [name], MD;  Location: [facility];  Service: Endoscopy;;  . POLYPECTOMY  06/29/2017   Procedure: POLYPECTOMY INTESTINAL;  Surgeon: [name], MD;  Location: [facility];  Service: Endoscopy;;  . SKIN GRAFT    . TEE WITHOUT CARDIOVERSION N/A 12/25/2015   Procedure: TRANSESOPHAGEAL ECHOCARDIOGRAM (TEE);  Surgeon: Melrose Nakayama, MD;  Location: [facility];  Service: Open Heart Surgery;  Laterality: N/A;  . TONSILLECTOMY    . UVULOPALATOPHARYNGOPLASTY         Home Medications    Prior to Admission medications   Medication Sig Start  Date End Date Taking? Authorizing Provider  acetaminophen (TYLENOL) 500 MG tablet Take 1,000 mg by mouth every 6 (six) hours as needed for moderate pain or headache.     [provider]  amLODipine (NORVASC) 10 MG tablet Take 10 mg by mouth daily.  10/06/16   [provider]  ASPIRIN 81 PO Take by mouth daily.    [provider]  atorvastatin (LIPITOR) 40 MG tablet Take 1 tablet (40 mg total) by mouth daily at 6 PM. 12/30/15   Barrett, Erin R, PA-C  budesonide-formoterol (SYMBICORT) 160-4.5 MCG/ACT inhaler Inhale 2 puffs into the lungs 2 (two) times daily. 09/16/16   Flora Lipps, MD  buPROPion (WELLBUTRIN XL) 300 MG 24 hr tablet Take 300 mg by mouth daily.  [provider]  carvedilol (COREG) 12.5 MG tablet Take 12.5 mg by mouth 2 (two) times daily.  09/27/16   [provider]  doxepin (SINEQUAN) 25 MG capsule Take 50 mg daily by mouth.  08/02/16   [provider]  DUREZOL 0.05 % EMUL Place 1 drop into the left eye at bedtime. 02/03/17   [provider]  fluticasone (FLONASE) 50 MCG/ACT nasal spray Place 2 sprays into both nostrils daily.     [provider]  gabapentin (NEURONTIN) 300 MG capsule Take 300 mg 3 (three) times daily by mouth.  08/02/16   [provider]  Incontinence Supply Disposable (DEPEND ADJUSTABLE UNDERWEAR LG) MISC Wear pads tid 09/19/16   McGowan, Larene Beach A, PA-C  Ipratropium-Albuterol (COMBIVENT RESPIMAT) 20-100 MCG/ACT AERS respimat Inhale 2 puffs into the lungs every 6 (six) hours as needed for wheezing or shortness of breath.    [provider]  JANUMET 50-1000 MG tablet Take 1 tablet by mouth 2 (two) times daily. 08/02/16   [provider]  lisinopril (PRINIVIL,ZESTRIL) 20 MG tablet Take 20 mg 2 (two) times daily by mouth.  08/02/16   [provider]  LORazepam (ATIVAN) 1 MG tablet Take 1-2 tablets (1-2 mg total) by mouth 2 (two) times daily. Pt takes one tablet in the morning  and two at bedtime. Patient taking differently: Take 1-2 mg by mouth See admin instructions. Take 1 mg by mouth in the morning and take 2 mg by mouth at bedtime 01/04/16   Gladstone Lighter, MD  Na Sulfate-K Sulfate-Mg Sulf (SUPREP BOWEL PREP KIT) 17.5-3.13-1.6 GM/177ML SOLN Take 1 kit by mouth as directed. 03/30/17   [name], MD  nitrofurantoin, macrocrystal-monohydrate, (MACROBID) 100 MG capsule Take 1 capsule (100 mg total) by mouth 2 (two) times daily. 08/20/17   Norval Gable, MD  nitroGLYCERIN (NITROLINGUAL) 0.4 MG/SPRAY spray Place 1 spray under the tongue every 5 (five) minutes x 3 doses as needed for chest pain.    [provider]  pantoprazole (PROTONIX) 40 MG tablet Take 40 mg by mouth daily.     [provider]  risperidone (RISPERDAL) 4 MG tablet Take 4 mg by mouth at bedtime.  08/02/16   [provider]  tamsulosin (FLOMAX) 0.4 MG CAPS capsule Take 1 capsule (0.4 mg total) by mouth daily. 05/08/17   Zara Council A, PA-C  topiramate (TOPAMAX) 100 MG tablet Take 100 mg by mouth daily.     [provider]  traMADol (ULTRAM) 50 MG tablet Take 1 tablet (50 mg total) by mouth every 8 (eight) hours as needed. 07/20/17   Coral Spikes, DO    Family History Family History  Problem Relation Age of Onset  . Heart disease Mother   . Heart disease Brother   . Prostate cancer Father   . Heart disease Unknown   . Heart disease Brother   . Kidney disease Neg Hx   . Kidney cancer Neg Hx   . Bladder Cancer Neg Hx     Social History Social History   Tobacco Use  . Smoking status: Former Smoker    Packs/day: 0.50    Years: 15.00    Pack years: 7.50    Types: Cigarettes    Last attempt to quit: 05/18/1999    Years since quitting: 18.2  . Smokeless tobacco: Never Used  . Tobacco comment: quit 17 + years  Substance Use Topics  . Alcohol use: No    Alcohol/week: 0.0 oz  . Drug  use: No     Allergies   Morphine and related; Synalgos-dc  [aspirin-caff-dihydrocodeine]; and Synchlor a [chlorpheniramine]   Review of Systems Review of Systems  Respiratory: Negative for shortness of breath.   Cardiovascular: Negative for chest pain.  Gastrointestinal: Negative for abdominal pain.  Genitourinary: Positive for dysuria.  Neurological: Negative for headaches.     Physical Exam Triage Vital Signs ED Triage Vitals [08/20/17 1550]  Enc Vitals Group     BP (!) 149/107     Pulse Rate 76     Resp 18     Temp 98 F (36.7 C)     Temp Source Oral     SpO2 98 %     Weight 204 lb (92.5 kg)     Height _0  (1.905 m)     Head Circumference      Peak Flow      Pain Score 6     Pain Loc      Pain Edu?      Excl. in [place]?    No data found.  Updated Vital Signs BP (!) 149/107 (BP Location: Right Arm)   Pulse 76   Temp 98 F (36.7 C) (Oral)   Resp 18   Ht _1  (1.905 m)   Wt 204 lb (92.5 kg)   SpO2 98%   BMI 25.50 kg/m   Visual Acuity Right Eye Distance:   Left Eye Distance:   Bilateral Distance:    Right Eye Near:   Left Eye Near:    Bilateral Near:     Physical Exam  Constitutional: He is oriented to person, place, and time. He appears well-developed and well-nourished. No distress.  HENT:  Head: Normocephalic and atraumatic.  Cardiovascular: Normal rate, regular rhythm, normal heart sounds and intact distal pulses.  No murmur heard. Pulmonary/Chest: Effort normal and breath sounds normal. No stridor. No respiratory distress. He has no wheezes. He has no rales.  Abdominal: Soft. Bowel sounds are normal. He exhibits no distension and no mass. There is no tenderness. There is no rebound and no guarding.  Neurological: He is alert and oriented to person, place, and time.  Skin: No rash noted. He is not diaphoretic.  Nursing note and vitals reviewed.    UC Treatments / Results  Labs (all labs ordered are listed, but only abnormal results are displayed) Labs Reviewed  URINALYSIS, COMPLETE (UACMP) WITH  MICROSCOPIC - Abnormal; Notable for the following components:      Result Value   APPearance CLOUDY (*)    Hgb urine dipstick SMALL (*)    Leukocytes, UA LARGE (*)    Bacteria, UA RARE (*)    All other components within normal limits  URINE CULTURE    EKG None  Radiology No results found.  Procedures Procedures (including critical care time)  Medications Ordered in UC Medications - No data to display  Initial Impression / Assessment and Plan / UC Course  I have reviewed the triage vital signs and the nursing notes.  Pertinent labs & imaging results that were available during my care of the patient were reviewed by me and considered in my medical decision making (see chart for details).      Final Clinical Impressions(s) / UC Diagnoses   Final diagnoses:  Urinary tract infection with hematuria, site unspecified   Discharge Instructions   None    ED Prescriptions    Medication Sig Dispense Auth. Provider   nitrofurantoin, macrocrystal-monohydrate, (MACROBID) 100 MG capsule Take 1  capsule (100 mg total) by mouth 2 (two) times daily. 14 capsule Norval Gable, MD     1. Lab results and diagnosis reviewed with patient 2. rx as per orders above; reviewed possible side effects, interactions, risks and benefits  3. Recommend supportive treatment with increase water intake 4. Follow-up prn if symptoms worsen or don't improve  Controlled Substance Prescriptions [place] Controlled Substance Registry consulted? Not Applicable   Norval Gable, MD 08/20/17 [phone]

## 2017-08-20 NOTE — ED Triage Notes (Signed)
Pt with dysuria (burning with urination) x past few days. Chronic low back pain. Pain 6/10

## 2017-08-22 LAB — URINE CULTURE: Special Requests: NORMAL

## 2017-09-01 DIAGNOSIS — N400 Enlarged prostate without lower urinary tract symptoms: Secondary | ICD-10-CM | POA: Diagnosis not present

## 2017-09-01 DIAGNOSIS — I10 Essential (primary) hypertension: Secondary | ICD-10-CM | POA: Diagnosis not present

## 2017-09-01 DIAGNOSIS — D509 Iron deficiency anemia, unspecified: Secondary | ICD-10-CM | POA: Diagnosis not present

## 2017-09-01 DIAGNOSIS — R739 Hyperglycemia, unspecified: Secondary | ICD-10-CM | POA: Diagnosis not present

## 2017-09-01 DIAGNOSIS — J449 Chronic obstructive pulmonary disease, unspecified: Secondary | ICD-10-CM | POA: Diagnosis not present

## 2017-09-01 DIAGNOSIS — E785 Hyperlipidemia, unspecified: Secondary | ICD-10-CM | POA: Diagnosis not present

## 2017-09-01 DIAGNOSIS — F319 Bipolar disorder, unspecified: Secondary | ICD-10-CM | POA: Diagnosis not present

## 2017-09-07 ENCOUNTER — Other Ambulatory Visit: Payer: Self-pay | Admitting: Internal Medicine

## 2017-09-11 ENCOUNTER — Ambulatory Visit (INDEPENDENT_AMBULATORY_CARE_PROVIDER_SITE_OTHER): Payer: Medicare Other | Admitting: Internal Medicine

## 2017-09-11 ENCOUNTER — Encounter: Payer: Self-pay | Admitting: Internal Medicine

## 2017-09-11 VITALS — BP 142/88 | HR 66 | Ht 75.0 in | Wt 206.0 lb

## 2017-09-11 DIAGNOSIS — J449 Chronic obstructive pulmonary disease, unspecified: Secondary | ICD-10-CM | POA: Diagnosis not present

## 2017-09-11 MED ORDER — IPRATROPIUM-ALBUTEROL 20-100 MCG/ACT IN AERS: 1.0000 | INHALATION_SPRAY | Freq: Four times a day (QID) | RESPIRATORY_TRACT | 12 refills | Status: DC

## 2017-09-11 NOTE — Progress Notes (Signed)
[facility] Consultation      MRN# [id number] Roger Pruitt 06/28/1950   CC: Chief Complaint  Patient presents with  . Follow-up    SOB; NP cough: chest tightness      Brief History: 67 yo male former smoker seen for Multiple pulmonary nodules.   Events since last clinic visit: Patient has chronic cough. And chronic shortness of breath and dyspnea on exertion  had quadruple coronary artery bypass surgery.  No fever, no chills, no sweats. Cough is dry with no significant nasal drainage. Had a CT chest for pulmonary nodules done on 03/15/2017 Recent CT chest shows stable nodules and have been stable since 09/2015  No signs of infection at this time No signs of acute heart failure at this time  Previous assessment of his dyspnea on exertion was related to deconditioned state  Prior PFTs show scooping of the expiratory limb suggestive of obstruction but normal ratio and normal FEV1   Patient has increased exertion dyspnea I have suggested to use combivent prior to exertion    Review of Systems  Constitutional: Negative for chills and fever.  Eyes: Negative for blurred vision.  Respiratory: Positive for cough and shortness of breath. Negative for hemoptysis, sputum production and wheezing.   Cardiovascular: Positive for orthopnea. Negative for chest pain and palpitations.  Neurological: Negative for dizziness and headaches.  Endo/Heme/Allergies: Does not bruise/bleed easily.  Psychiatric/Behavioral: Negative for depression and substance abuse.      Allergies:  Morphine and related; Synalgos-dc [aspirin-caff-dihydrocodeine]; and Synchlor a [chlorpheniramine]  Physical Examination:  VS: BP (!) 142/88 (BP Location: Left Arm, Cuff Size: Normal)   Pulse 66   Ht 6\' 3"  (1.905 m)   Wt 206 lb (93.4 kg)   SpO2 99%   BMI 25.75 kg/m   General Appearance: No distress  HEENT: PERRLA, no ptosis, no other lesions noticed Pulmonary:normal breath  sounds., diaphragmatic excursion normal.No wheezing, No rales   Cardiovascular:  Normal S1,S2.  No m/r/g.     Abdomen:Exam: Benign, Soft, non-tender, No masses  Skin:   warm, no rashes, no ecchymosis  Extremities: normal, no cyanosis, clubbing, warm with normal capillary refill.       CT Chest 02/2018 I have Independently reviewed images of  CT chest  on 09/11/2017 Interpretation: Stable small solid appearing pulmonary nodules. The largest measures 6 mm. Stable since 2017  Assessment and Plan:   67 year old male with multiple bilateral benign subcentimeter pulmonary nodules seen for follow-up visit in the setting of chronic shortness of breath and chronic dyspnea and exertion in the setting of deconditioned state  Pulmonary nodules Repeat CT chest at last OV showed stable pulmonary nodules, the left lower lobe dominant nodule is now 6 mm. I believe at this point in the majority is nodules are benign reactive nodules, he has had adequate follow-up,   Plan: -Resolution of some pulmonary nodules, dominant left lower lobe nodule 6 mm and stable in size No more CT chests needed, stable nodules since 09/2015   Dyspnea likely related to deconditioned state Stable dyspnea on exertion. I suspect deconditioning is may be adding to the level of dyspnea is having. Primary function tests and 6 minute walk test within acceptable limits. Pulmonary function test showed FEV1 of 89% FEV1/FVC 79%, flow loops with mild flattening at end expiratory showing may be some early signs of obstruction.  He does have significant coronary artery disease and recently had a quadruple bypass surgery. -Symbicort 160 twice daily doing better since starting  Recommend Combivent inhaler prior to exertion  Allergic rhinitis, seasonal Flonase at home that he uses once per day to each nostril  Plan: -Flonase twice a day  Deconditioned state -Exercise as tolerated, 30 minutes of walking at a moderate pace at least 3  times per week as tolerated  Intermittent cough Guaif with codeine as needed  Patient  satisfied with Plan of action and management. All questions answered Follow-up in 6 months with wife   Corrin Parker, M.D.  [facility]  Medical Director [place] Director [facility] Cardio-Pulmonary Department

## 2017-09-11 NOTE — Patient Instructions (Signed)
Please use COMBIVENT PRIOR TO EXERTION Continue Symbicort

## 2017-09-27 ENCOUNTER — Encounter: Payer: Self-pay | Admitting: Anesthesiology

## 2017-09-27 ENCOUNTER — Other Ambulatory Visit: Payer: Self-pay

## 2017-10-02 IMAGING — CR DG CHEST 1V PORT
1 series · 1 of 1 positions shown · non-contrast
Comparison: 07/28/2015; 08/23/2014; 01/30/2014; chest CT -
10/23/2015

CLINICAL DATA: Post CABG

EXAM:
PORTABLE CHEST 1 VIEW

[AP]
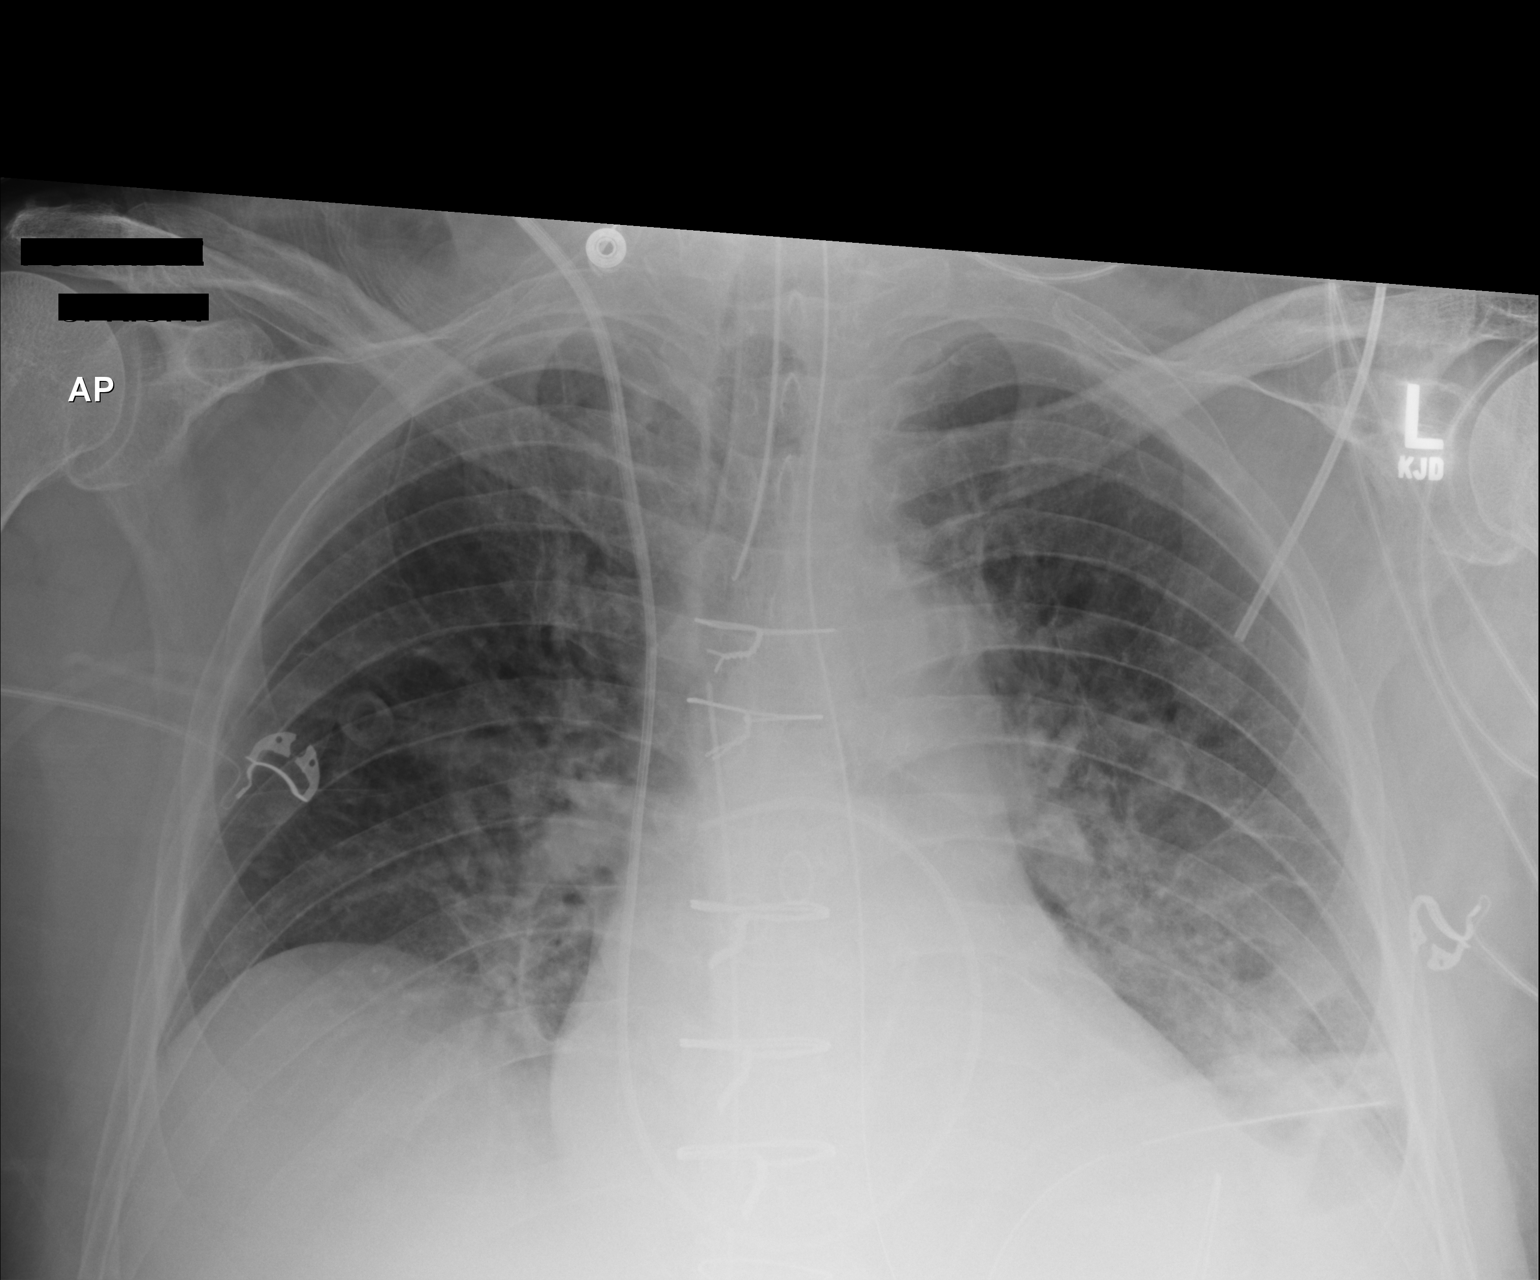

[1 of 1 positions shown; findings below may reference images not displayed]

FINDINGS: Interval mild enlargement of the cardiac silhouette and mediastinal
contours likely the sequela postoperative state. Post median
sternotomy and CABG. Endotracheal tube overlies the tracheal air
column with tip approximately 3.7 cm above the carina. Enteric tube
tip and side port project below the left hemidiaphragm. Right
jugular approach PA catheter tip overlies the right main pulmonary
artery outflow tract. Mediastinal drain and left-sided chest tube.
No pneumothorax. Mild cephalization of flow. Decreased lung volumes
with bilateral peri and infrahilar heterogeneous opacities, left
greater than right. Trace left-sided effusion is not excluded.
Unchanged bones.
IMPRESSION: 1. Appropriately positioned support apparatus as above. No
pneumothorax.
2. Findings suggestive of mild edema with perihilar and bibasilar
atelectasis, left greater than right.
3. Suspected trace left-sided effusion

## 2017-10-03 IMAGING — CR DG CHEST 1V PORT
1 series · 1 of 1 positions shown · non-contrast
Comparison: 12/25/2015 and earlier.

CLINICAL DATA: 64-year-old male status post CABG. Initial
encounter.

EXAM:
PORTABLE CHEST 1 VIEW

[AP]
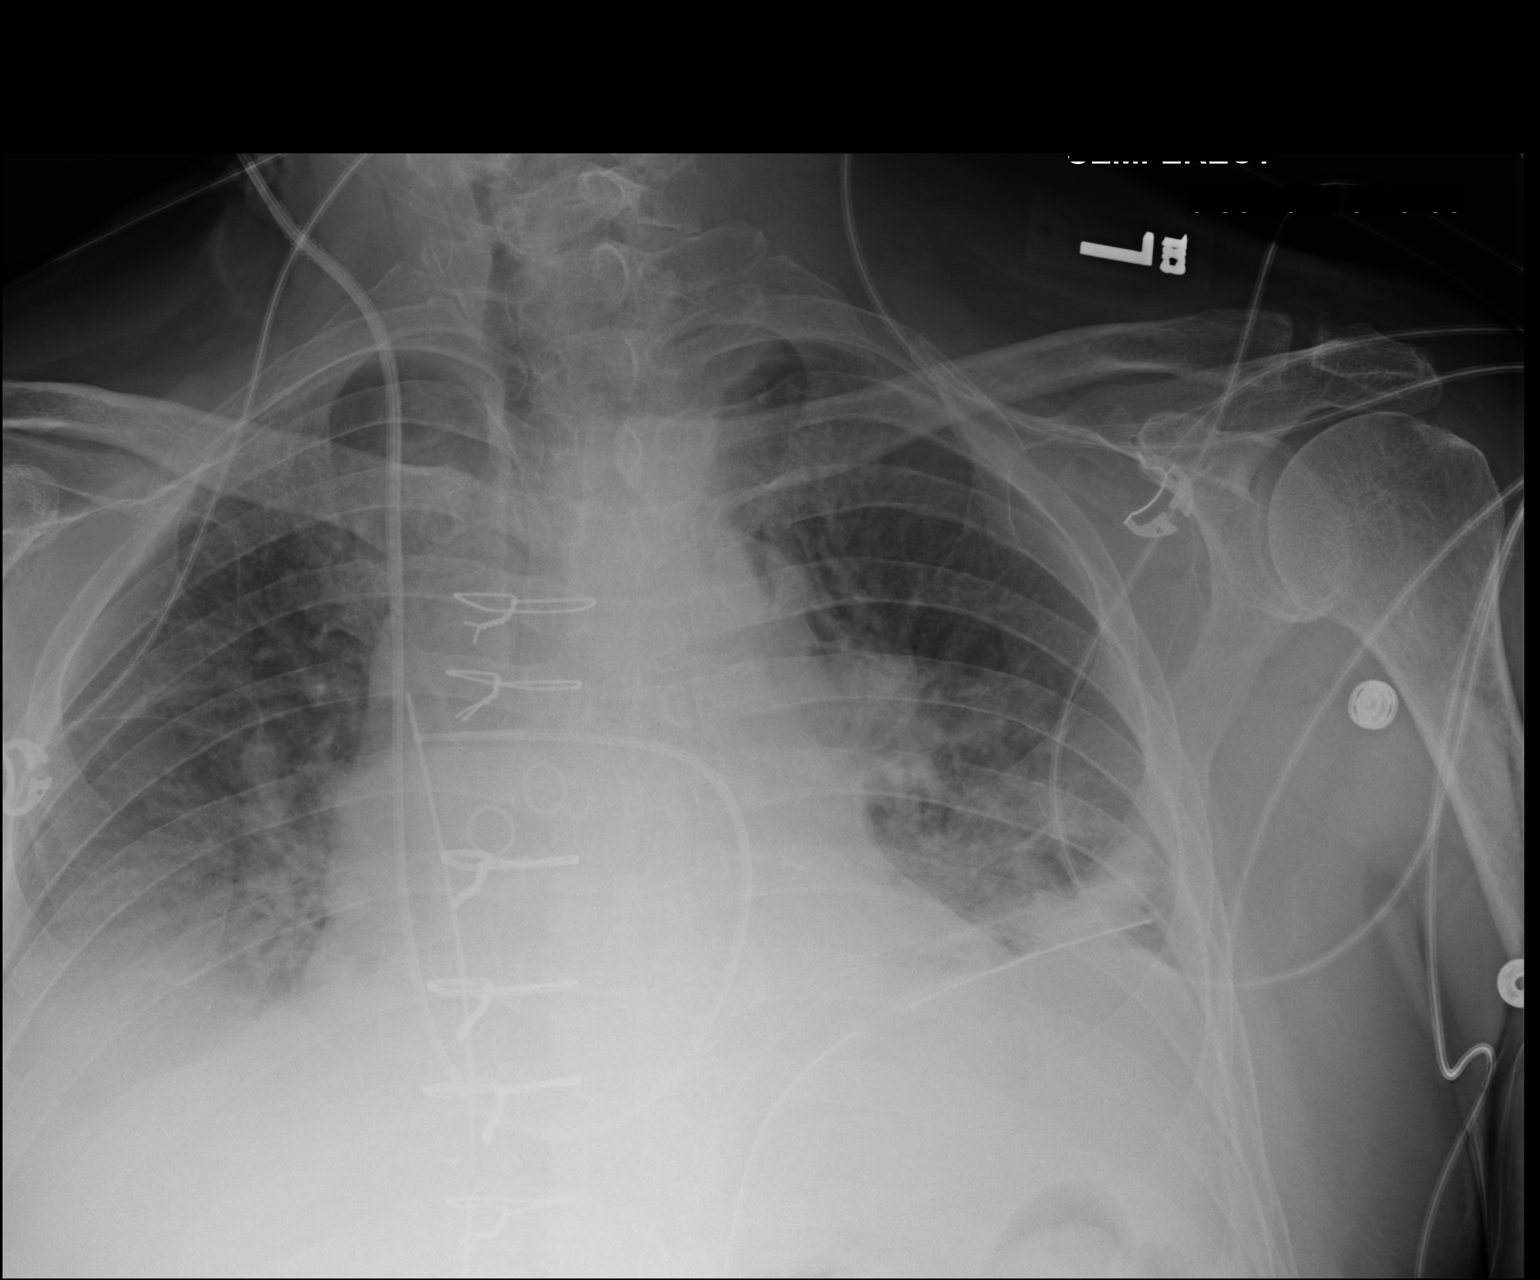

[1 of 1 positions shown; findings below may reference images not displayed]

FINDINGS: Portable AP semi upright view at 4178 hours. Extubated. Enteric tube
removed. Swan-Ganz catheter remains in place and the tip is stable
at the distal right main pulmonary artery level. Mediastinal and
chest tubes remain.

Lower lung volumes. Mildly increased bibasilar opacity. Possible
small pleural effusions. Stable cardiac size and mediastinal
contours. Sequelae of CABG.

There is a small left apical pneumothorax evident today (arrow). The
left chest tube position appears stable.
IMPRESSION: 1. Extubated and enteric tube removed.
2. Left chest tube remains in place but there is a Small Left Apical
Pneumothorax evident today.
3. Lower lung volumes with increased bibasilar atelectasis. Possible
small pleural effusions.

## 2017-10-04 ENCOUNTER — Telehealth: Payer: Self-pay | Admitting: Gastroenterology

## 2017-10-04 IMAGING — CR DG CHEST 1V PORT
1 series · 1 of 1 positions shown · non-contrast
Comparison: 12/26/2015 and earlier.

CLINICAL DATA: 64-year-old male with cough. Status post CABG.
Initial encounter.

EXAM:
PORTABLE CHEST 1 VIEW

[AP]
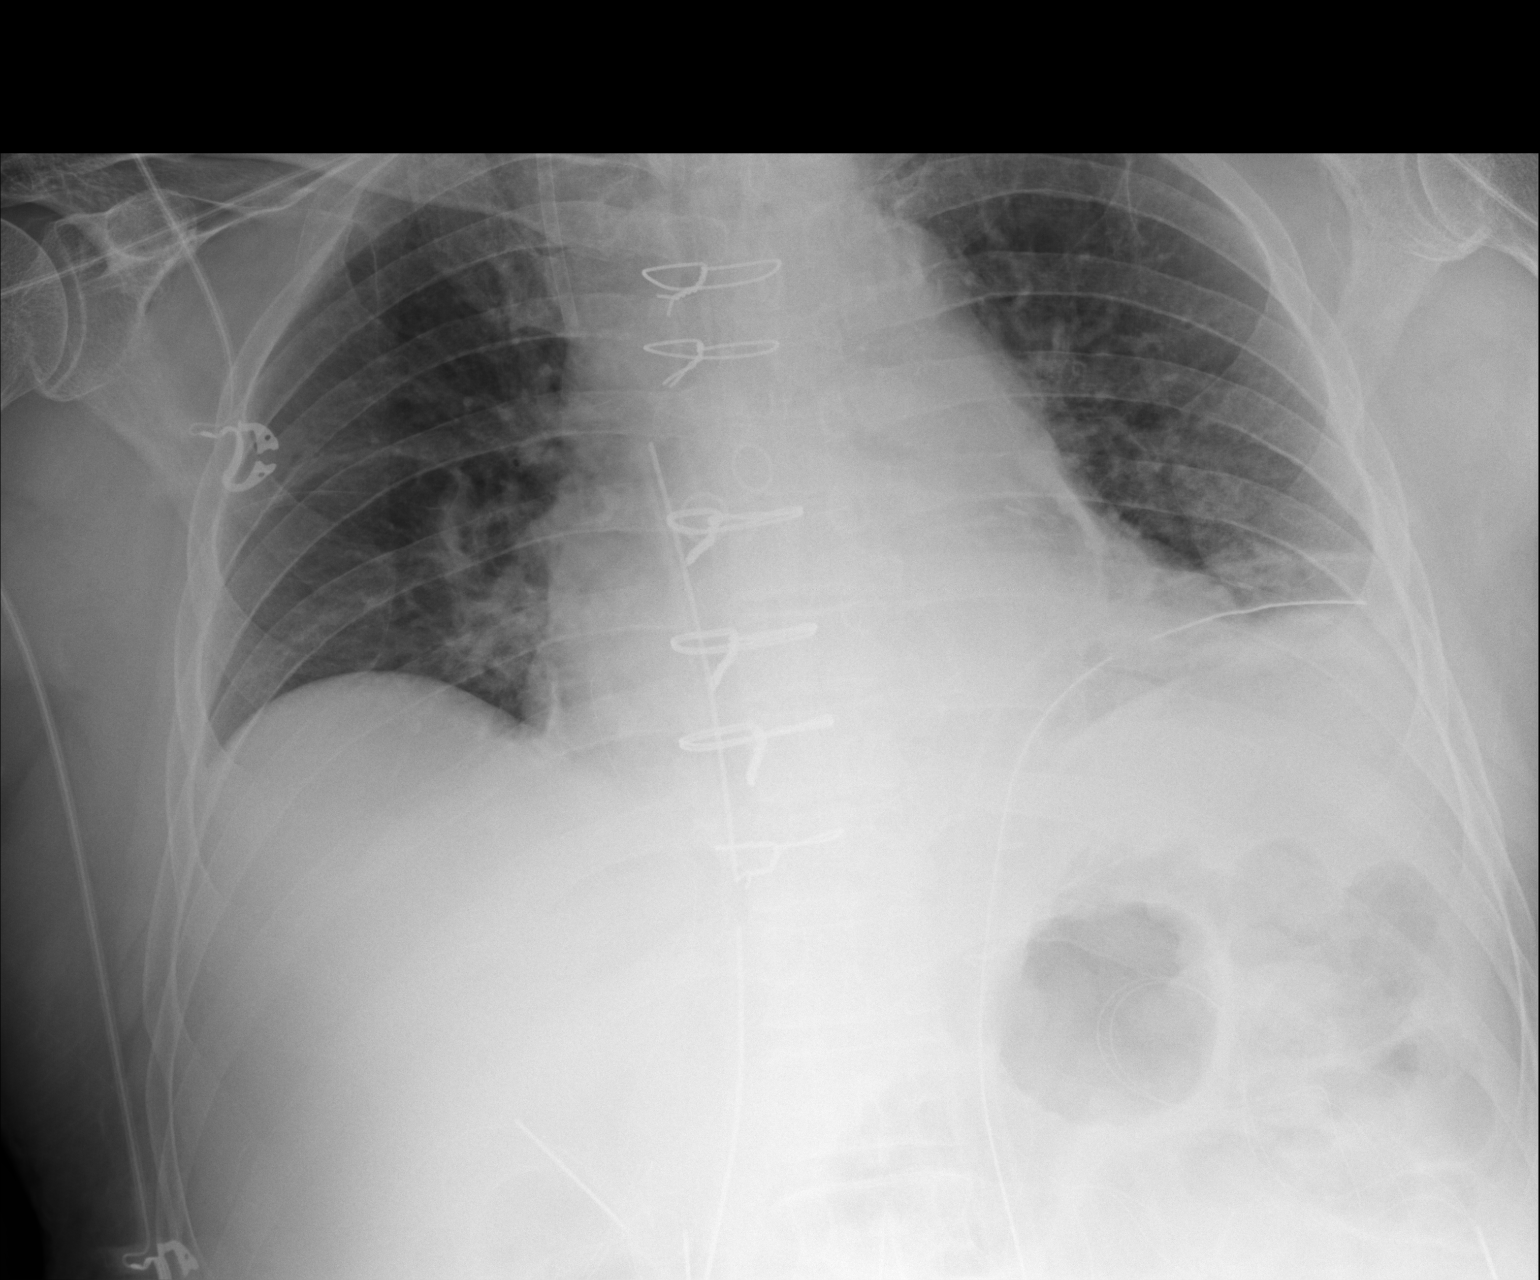

[1 of 1 positions shown; findings below may reference images not displayed]

FINDINGS: Portable AP semi upright view at 6629 hours. Swan-Ganz catheter has
been removed. Right IJ introducer sheath remains. Mediastinal tube
and left chest tube remain in place. A small left apical
pneumothorax is stable.

Continued low lung volumes. Left greater than right lung base
atelectasis. No pulmonary edema. No pleural effusion is evident.
Stable cardiac size and mediastinal contours. Negative visible bowel
gas pattern.
IMPRESSION: 1. Swan-Ganz catheter removed. Right IJ introducer sheath,
mediastinal tube and left chest tube remain in place.
2. Stable small left apical pneumothorax.
3. Low lung volumes with atelectasis.

## 2017-10-04 NOTE — Telephone Encounter (Signed)
Patient called to cancel his procedure due to no transportation. He doesn't want to reschedule. I called [facility] to let them know

## 2017-10-05 ENCOUNTER — Ambulatory Visit: Admission: RE | Admit: 2017-10-05 | Payer: Medicare Other | Source: Ambulatory Visit | Admitting: Gastroenterology

## 2017-10-05 HISTORY — DX: Dizziness and giddiness: R42

## 2017-10-05 HISTORY — DX: Presence of dental prosthetic device (complete) (partial): Z97.2

## 2017-10-05 IMAGING — CR DG CHEST 2V
2 series · 2 of 2 positions shown · non-contrast
Comparison: 12/27/2015.

CLINICAL DATA: Atelectasis.

EXAM:
CHEST  2 VIEW

[chest pa]
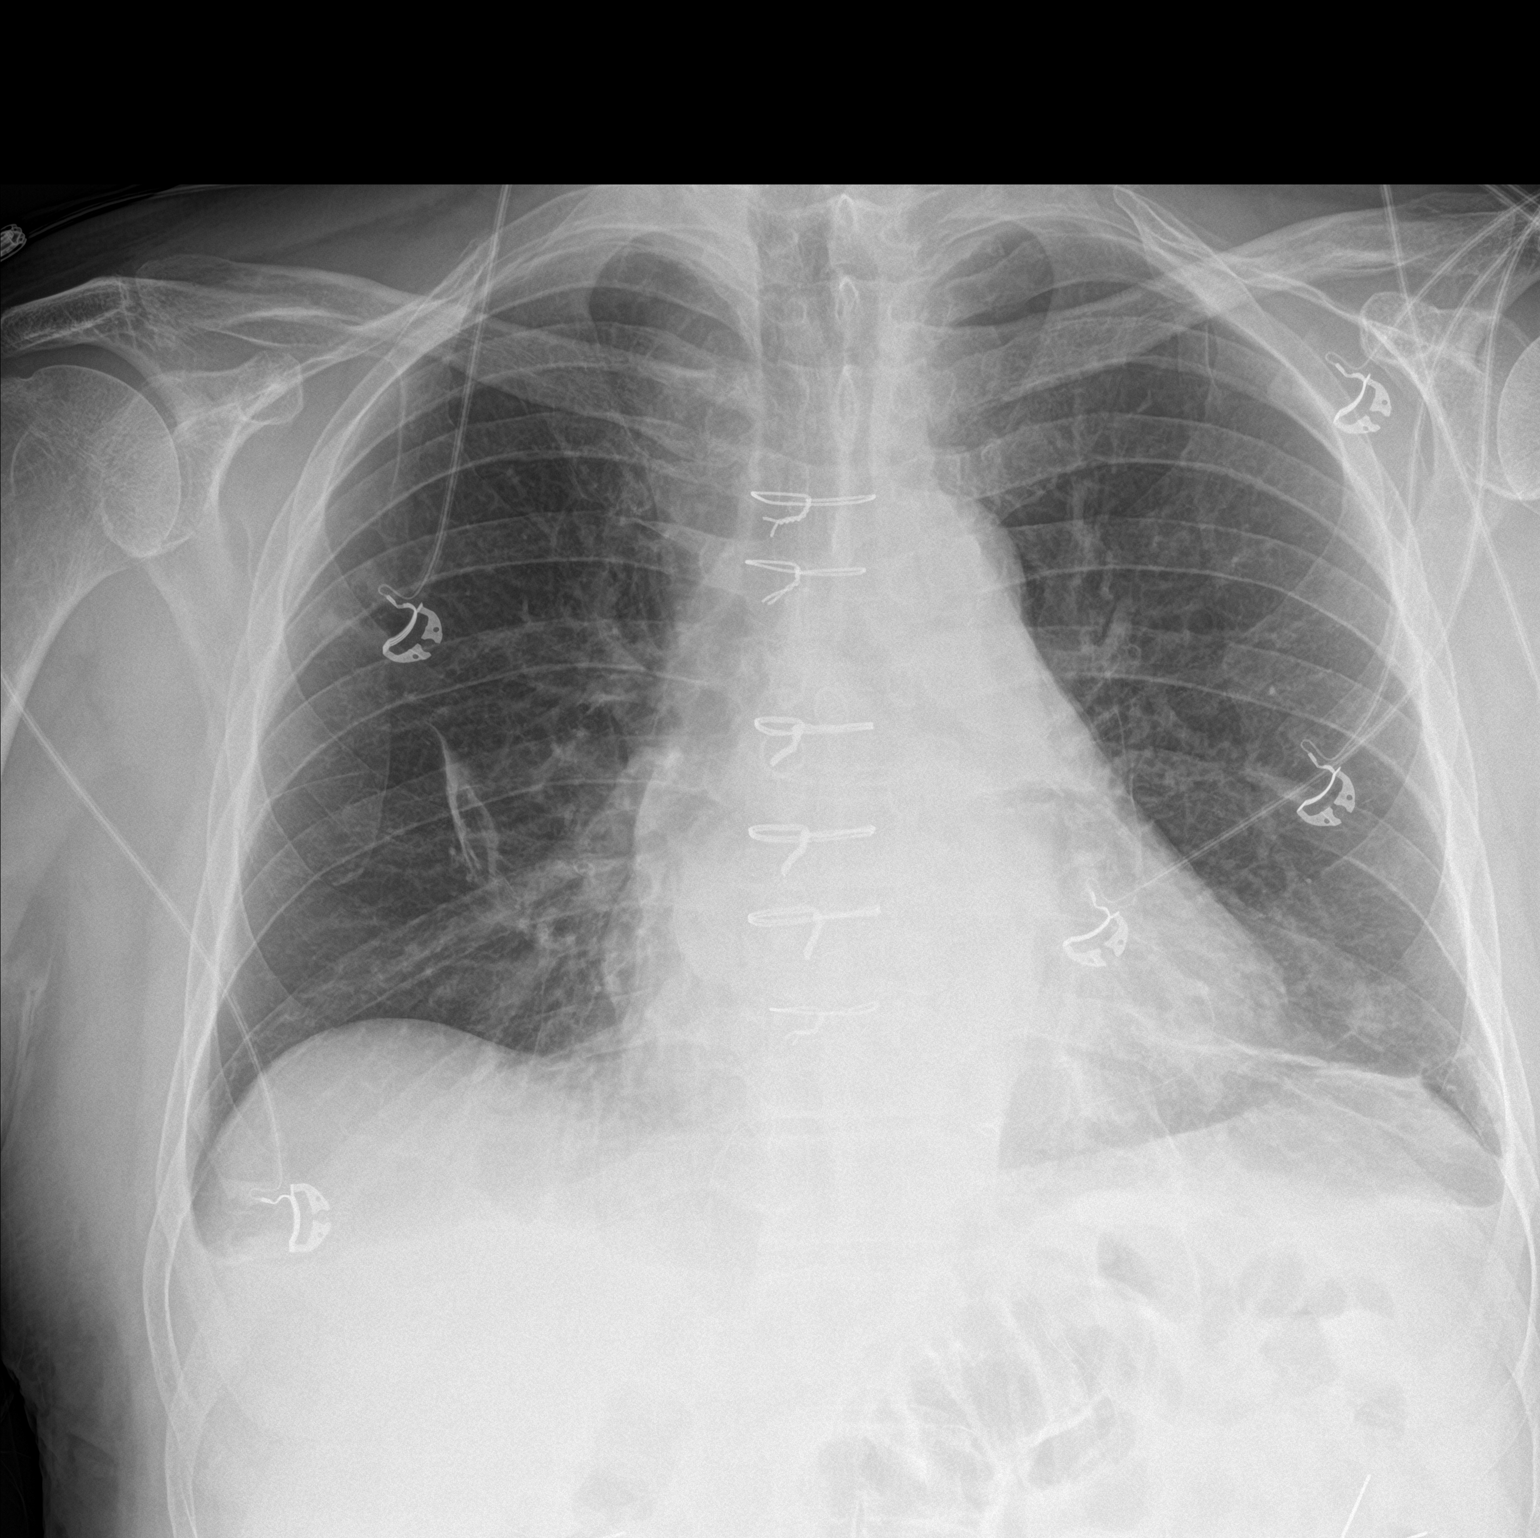

[chest lat]
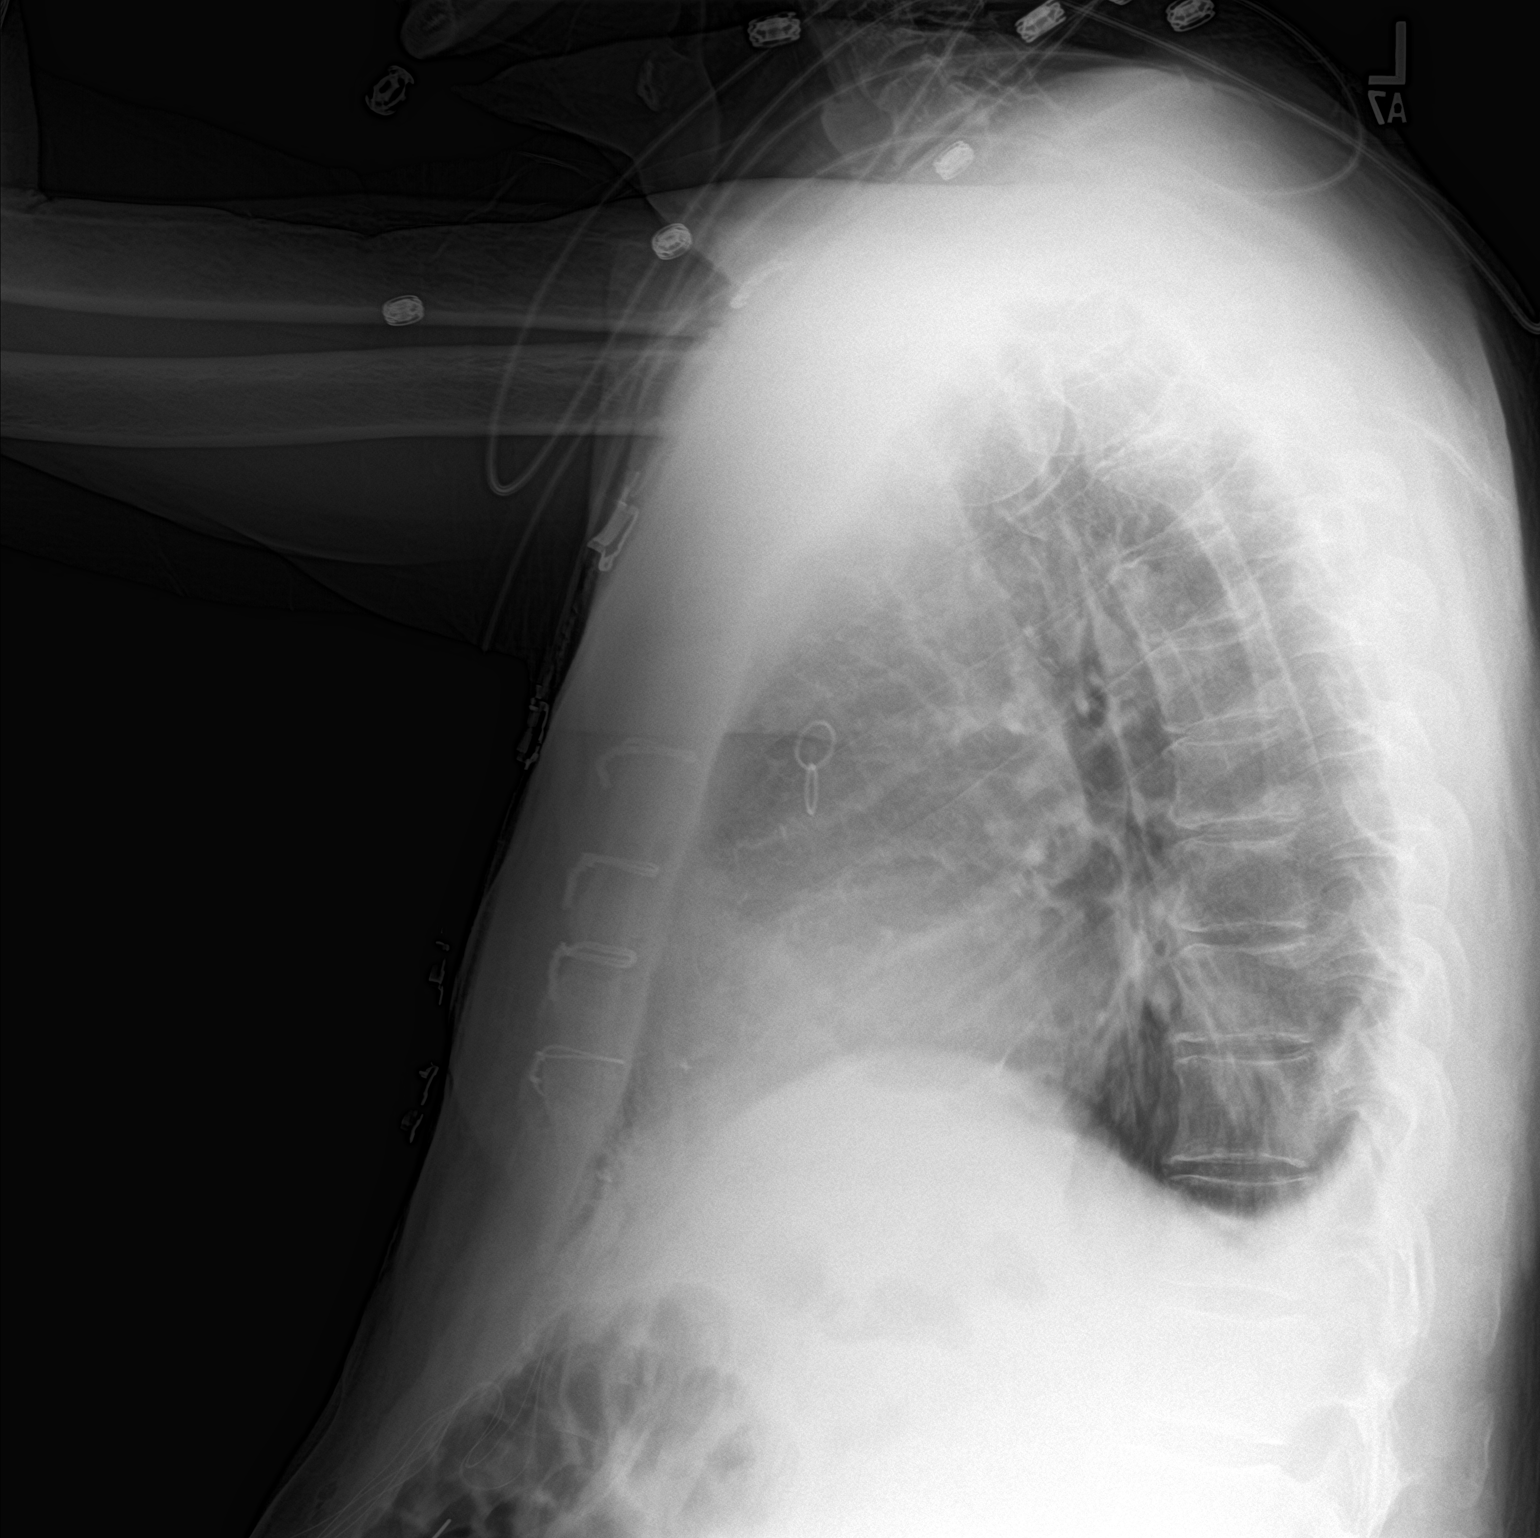

[2 of 2 positions shown; findings below may reference images not displayed]

FINDINGS: Interval removal of right IJ sheath, mediastinal drainage catheter,
left chest tube. Overlying EKG leads. Density noted over the right
upper and left mid lungs most likely secondary to EKG leads.
Mediastinum and hilar structures are normal. Prior CABG.
Cardiomegaly with normal pulmonary vascularity. Low lung volumes
with basilar atelectasis again noted. Interim improvement from prior
exam . No pleural effusion or pneumothorax .
IMPRESSION: 1. Interim removal of right IJ sheath, mediastinal drainage
catheter, left chest tube. No pneumothorax noted on today's exam.

2. Lung volumes with mild bibasilar atelectasis, improved from prior
exam.

## 2017-10-05 SURGERY — ESOPHAGOGASTRODUODENOSCOPY (EGD) WITH PROPOFOL
Anesthesia: Choice

## 2017-10-07 ENCOUNTER — Ambulatory Visit
Admission: EM | Admit: 2017-10-07 | Discharge: 2017-10-07 | Disposition: A | Discharge status: Home / Self Care | Payer: Medicare Other | Attending: Emergency Medicine | Admitting: Emergency Medicine

## 2017-10-07 ENCOUNTER — Other Ambulatory Visit: Payer: Self-pay

## 2017-10-07 DIAGNOSIS — Z885 Allergy status to narcotic agent status: Secondary | ICD-10-CM | POA: Insufficient documentation

## 2017-10-07 DIAGNOSIS — Z886 Allergy status to analgesic agent status: Secondary | ICD-10-CM | POA: Diagnosis not present

## 2017-10-07 DIAGNOSIS — Z87891 Personal history of nicotine dependence: Secondary | ICD-10-CM | POA: Insufficient documentation

## 2017-10-07 DIAGNOSIS — R300 Dysuria: Secondary | ICD-10-CM | POA: Diagnosis not present

## 2017-10-07 DIAGNOSIS — Z79899 Other long term (current) drug therapy: Secondary | ICD-10-CM | POA: Insufficient documentation

## 2017-10-07 DIAGNOSIS — Z7982 Long term (current) use of aspirin: Secondary | ICD-10-CM | POA: Diagnosis not present

## 2017-10-07 DIAGNOSIS — R350 Frequency of micturition: Secondary | ICD-10-CM | POA: Insufficient documentation

## 2017-10-07 LAB — URINALYSIS, COMPLETE (UACMP) WITH MICROSCOPIC
BILIRUBIN URINE: NEGATIVE
Bacteria, UA: NONE SEEN
GLUCOSE, UA: NEGATIVE mg/dL
KETONES UR: NEGATIVE mg/dL
Nitrite: NEGATIVE
PROTEIN: 30 mg/dL — AB
Specific Gravity, Urine: 1.015 (ref 1.005–1.030)
pH: 7 (ref 5.0–8.0)

## 2017-10-07 NOTE — ED Triage Notes (Signed)
Pt with 2 weeks of low back pain, dysuria, and cloudy urine.
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
## 2017-10-07 NOTE — Discharge Instructions (Signed)
As discussed and jointly agreed upon, we will await your urine culture prior to any treatment.  I think this is reasonable as long as your symptoms do not worsen or new symptoms present.  Please ensure plenty of water.  Also as discussed, please discuss with your primary care provider a consult with urology for frequent urinary tract infections and also ensure that your primary care provider is monitoring your blood pressure has elevated today.  If blood pressure persistently elevated, please ensure prompt follow-up, with your PCP

## 2017-10-09 LAB — URINE CULTURE

## 2017-11-11 ENCOUNTER — Other Ambulatory Visit: Payer: Self-pay | Admitting: Internal Medicine

## 2017-11-21 ENCOUNTER — Ambulatory Visit: Payer: Self-pay | Admitting: Internal Medicine

## 2017-11-30 ENCOUNTER — Ambulatory Visit (INDEPENDENT_AMBULATORY_CARE_PROVIDER_SITE_OTHER): Payer: Medicare HMO | Admitting: Internal Medicine

## 2017-11-30 DIAGNOSIS — Z23 Encounter for immunization: Secondary | ICD-10-CM | POA: Diagnosis not present

## 2017-11-30 NOTE — Progress Notes (Signed)
Patient here accompanying his spouse who has appointment. He wanted to get a flu shot.

## 2017-12-10 ENCOUNTER — Ambulatory Visit
Admission: EM | Admit: 2017-12-10 | Discharge: 2017-12-10 | Disposition: A | Discharge status: Home / Self Care | Payer: Medicare HMO | Attending: Family Medicine | Admitting: Family Medicine

## 2017-12-10 ENCOUNTER — Other Ambulatory Visit: Payer: Self-pay

## 2017-12-10 DIAGNOSIS — E1140 Type 2 diabetes mellitus with diabetic neuropathy, unspecified: Secondary | ICD-10-CM | POA: Diagnosis not present

## 2017-12-10 DIAGNOSIS — D500 Iron deficiency anemia secondary to blood loss (chronic): Secondary | ICD-10-CM | POA: Insufficient documentation

## 2017-12-10 DIAGNOSIS — Z87891 Personal history of nicotine dependence: Secondary | ICD-10-CM | POA: Diagnosis not present

## 2017-12-10 DIAGNOSIS — N3000 Acute cystitis without hematuria: Secondary | ICD-10-CM

## 2017-12-10 DIAGNOSIS — K219 Gastro-esophageal reflux disease without esophagitis: Secondary | ICD-10-CM | POA: Insufficient documentation

## 2017-12-10 DIAGNOSIS — F429 Obsessive-compulsive disorder, unspecified: Secondary | ICD-10-CM | POA: Diagnosis not present

## 2017-12-10 DIAGNOSIS — F319 Bipolar disorder, unspecified: Secondary | ICD-10-CM | POA: Diagnosis not present

## 2017-12-10 DIAGNOSIS — G20 Parkinson's disease: Secondary | ICD-10-CM | POA: Insufficient documentation

## 2017-12-10 DIAGNOSIS — N401 Enlarged prostate with lower urinary tract symptoms: Secondary | ICD-10-CM | POA: Diagnosis not present

## 2017-12-10 DIAGNOSIS — E559 Vitamin D deficiency, unspecified: Secondary | ICD-10-CM | POA: Insufficient documentation

## 2017-12-10 DIAGNOSIS — Z8249 Family history of ischemic heart disease and other diseases of the circulatory system: Secondary | ICD-10-CM | POA: Insufficient documentation

## 2017-12-10 DIAGNOSIS — R300 Dysuria: Secondary | ICD-10-CM | POA: Diagnosis present

## 2017-12-10 DIAGNOSIS — E785 Hyperlipidemia, unspecified: Secondary | ICD-10-CM | POA: Insufficient documentation

## 2017-12-10 DIAGNOSIS — Z79899 Other long term (current) drug therapy: Secondary | ICD-10-CM | POA: Insufficient documentation

## 2017-12-10 DIAGNOSIS — I119 Hypertensive heart disease without heart failure: Secondary | ICD-10-CM | POA: Diagnosis not present

## 2017-12-10 DIAGNOSIS — N35919 Unspecified urethral stricture, male, unspecified site: Secondary | ICD-10-CM | POA: Insufficient documentation

## 2017-12-10 DIAGNOSIS — Z8673 Personal history of transient ischemic attack (TIA), and cerebral infarction without residual deficits: Secondary | ICD-10-CM | POA: Insufficient documentation

## 2017-12-10 DIAGNOSIS — I252 Old myocardial infarction: Secondary | ICD-10-CM | POA: Insufficient documentation

## 2017-12-10 DIAGNOSIS — Z885 Allergy status to narcotic agent status: Secondary | ICD-10-CM | POA: Insufficient documentation

## 2017-12-10 DIAGNOSIS — Z7982 Long term (current) use of aspirin: Secondary | ICD-10-CM | POA: Insufficient documentation

## 2017-12-10 DIAGNOSIS — B9689 Other specified bacterial agents as the cause of diseases classified elsewhere: Secondary | ICD-10-CM | POA: Diagnosis not present

## 2017-12-10 DIAGNOSIS — Z951 Presence of aortocoronary bypass graft: Secondary | ICD-10-CM | POA: Insufficient documentation

## 2017-12-10 DIAGNOSIS — I2510 Atherosclerotic heart disease of native coronary artery without angina pectoris: Secondary | ICD-10-CM | POA: Diagnosis not present

## 2017-12-10 DIAGNOSIS — Z886 Allergy status to analgesic agent status: Secondary | ICD-10-CM | POA: Diagnosis not present

## 2017-12-10 DIAGNOSIS — G4730 Sleep apnea, unspecified: Secondary | ICD-10-CM | POA: Diagnosis not present

## 2017-12-10 DIAGNOSIS — Z7951 Long term (current) use of inhaled steroids: Secondary | ICD-10-CM | POA: Insufficient documentation

## 2017-12-10 DIAGNOSIS — Z955 Presence of coronary angioplasty implant and graft: Secondary | ICD-10-CM | POA: Diagnosis not present

## 2017-12-10 DIAGNOSIS — J45909 Unspecified asthma, uncomplicated: Secondary | ICD-10-CM | POA: Diagnosis not present

## 2017-12-10 LAB — URINALYSIS, COMPLETE (UACMP) WITH MICROSCOPIC
BILIRUBIN URINE: NEGATIVE
GLUCOSE, UA: NEGATIVE mg/dL
KETONES UR: NEGATIVE mg/dL
NITRITE: POSITIVE — AB
Protein, ur: NEGATIVE mg/dL
SPECIFIC GRAVITY, URINE: 1.01 (ref 1.005–1.030)
Squamous Epithelial / LPF: NONE SEEN (ref 0–5)
WBC, UA: >50 WBC/hpf (ref 0–5)
pH: 7 (ref 5.0–8.0)

## 2017-12-10 MED ORDER — SULFAMETHOXAZOLE-TRIMETHOPRIM 800-160 MG PO TABS: 1.0000 | ORAL_TABLET | Freq: Two times a day (BID) | ORAL | 0 refills | Status: AC

## 2017-12-10 NOTE — ED Provider Notes (Signed)
[facility]  CSN: [id number] Arrival date & time: 12/10/17  1116  History   Chief Complaint Chief Complaint  Patient presents with  . Dysuria   HPI  67 year old male presents with dysuria.  Patient reports a 2-day history of dysuria.  He reports ongoing urinary frequency and nocturia.  He states that he is urinating 8 times a night.  He normally urinates 1-2 times a night.  No fever.  No chills.  No reports of abdominal pain.  He has history of frequent UTI.  He is no longer catheterizing intermittently.  No other associated symptoms.  No other complaints or concerns at this time.  PMH, Surgical Hx, Family Hx, Social History reviewed and updated as below.  Past Medical History:  Diagnosis Date  . Abdominal pain   . Acute cystitis without hematuria   . Anemia   . Anginal pain ([place])    rarely  . Arthritis    knees and hands  . Asthma   . Bipolar disorder ([place])    BIPOLAR/ SCHIZO/ AFFECTIVE   . BPD (bronchopulmonary dysplasia)   . BPH (benign prostatic hyperplasia)   . CAD (coronary artery disease)    a. s/p MI 2001, 2012;  b. s/p prior LCX stenting; b. 05/2012 Abnl MV; c. 05/2012 Cath: patent LCX stent w/ otw mod non-obs dzs->Med Rx.  . Carpal tunnel syndrome    surgery both hands  . Depression   . Disc degeneration, lumbar   . Dyspnea    WITH EXERTION  . Elevated PSA   . Esophageal reflux   . GERD (gastroesophageal reflux disease)   . Hematuria    history of gross and microscopic  . Hemorrhoid   . History of balanitis   . History of bladder infections   . History of echocardiogram    a. 07/2014 Echo: EF 60-65%, no rwma, nl LA size, nl RV/PASP.  Marland Kitchen History of urethral stricture   . Hyperlipidemia   . Hypertension    controlled  . Hypertensive heart disease   . Incomplete bladder emptying   . Iron deficiency anemia, unspecified   . Lower urinary tract symptoms   . Myocardial infarction ([place])    X7 LAST 2001  . Neuropathy   . Nocturia   . Obsessive  compulsive disorder   . Other dysphagia   . Panic attack   . Parkinson's disease ([place])   . Sleep apnea    SURGICAL TX  . Stroke ([place])   . Type II diabetes mellitus ([place])   . Unspecified vitamin D deficiency   . Urethral stricture   . Urinary retention   . Varicose vein of leg   . Vertigo    hx of  . Vitamin D deficiency   . Wears dentures    upper and lowers    Patient Active Problem List   Diagnosis Date Noted  . Iron deficiency anemia due to chronic blood loss   . Benign neoplasm of ascending colon   . Benign neoplasm of cecum   . Gastritis without bleeding   . Parkinsonism due to drug ([place]) 07/22/2016  . Chronic bilateral low back pain without sciatica 04/07/2016  . Syncope 12/31/2015  . Status post coronary artery bypass grafting 12/31/2015  . S/P CABG x 4 12/25/2015  . Unstable angina ([place]) 12/01/2015  . Hypertensive heart disease   . Type II diabetes mellitus ([place])   . Abnormal chest CT 07/10/2015  . Pulmonary nodules 07/09/2015  . Basal cell carcinoma of  skin 05/27/2015  . Bipolar disorder ([place]) 05/27/2015  . CAD (coronary artery disease), native coronary artery 05/27/2015  . DDD (degenerative disc disease), lumbosacral 05/27/2015  . Esophageal reflux 05/27/2015  . Benign essential hypertension 05/27/2015  . Sleep apnea 05/27/2015  . Urethral stricture 05/27/2015  . Benign prostatic hypertrophy (BPH) with incomplete bladder emptying 11/29/2014  . CVA (cerebral infarction) 08/23/2014  . Hypertension   . S/P coronary artery stent placement 09/30/2011  . Hyperlipidemia 09/30/2011  . History of myocardial infarction 09/03/2011  . Seasonal allergies 09/03/2011    Past Surgical History:  Procedure Laterality Date  . BACK SURGERY     lumbar disc  . BACK SURGERY  1984  . CARDIAC CATHETERIZATION  6/10;2011;Aug.2012;Oct. 2012;   . CARDIAC CATHETERIZATION  05/2012   [facility]; EF 60%, patent stent in the left circumflex, moderate three-vessel disease with no  flow-limiting lesions. Unchanged from most recent catheterization  . CARDIAC CATHETERIZATION N/A 12/10/2015   Procedure: Left Heart Cath and Coronary Angiography;  Surgeon: Corey Skains, MD;  Location: [facility];  Service: Cardiovascular;  Laterality: N/A;  . CARPAL TUNNEL RELEASE  2009   left  . CATARACT EXTRACTION W/PHACO Left 02/14/2017   Procedure: CATARACT EXTRACTION PHACO AND INTRAOCULAR LENS PLACEMENT (IOC);  Surgeon: Birder Robson, MD;  Location: [facility];  Service: Ophthalmology;  Laterality: Left;  [place] 00:42 AP% 16.6 CDE 7.14 Fluid pack lot # [id number]  . CATARACT EXTRACTION W/PHACO Right 02/28/2017   Procedure: CATARACT EXTRACTION PHACO AND INTRAOCULAR LENS PLACEMENT (IOC);  Surgeon: Birder Robson, MD;  Location: [facility];  Service: Ophthalmology;  Laterality: Right;  [place] 01:09 AP% 11.4 CDE 7.98 Fluid pack lot # [id number]  . COLONOSCOPY WITH PROPOFOL N/A 06/29/2017   Procedure: COLONOSCOPY WITH PROPOFOL;  Surgeon: [name], MD;  Location: [place];  Service: Endoscopy;  Laterality: N/A;  Diabetic - oral meds  . CORONARY ANGIOPLASTY  2011 & 2001   s/p stent  . CORONARY ARTERY BYPASS GRAFT     stent placement  . CORONARY ARTERY BYPASS GRAFT N/A 12/25/2015   Procedure: CORONARY ARTERY BYPASS GRAFTING (CABG) x4  , using left internal mammary artery and right leg greater saphenous vein harvested endoscopically LIMA-LAD SVG-OM2 SVG-DIAG SVG-PD;  Surgeon: Melrose Nakayama, MD;  Location: [place];  Service: Open Heart Surgery;  Laterality: N/A;  . [place]  . CYST REMOVAL LEG Right   . ESOPHAGOGASTRODUODENOSCOPY  06/29/2017   Procedure: ESOPHAGOGASTRODUODENOSCOPY (EGD);  Surgeon: [name], MD;  Location: [place];  Service: Endoscopy;;  . POLYPECTOMY  06/29/2017   Procedure: POLYPECTOMY INTESTINAL;  Surgeon: [name], MD;  Location: [place];  Service: Endoscopy;;  . SKIN GRAFT    . TEE WITHOUT CARDIOVERSION N/A  12/25/2015   Procedure: TRANSESOPHAGEAL ECHOCARDIOGRAM (TEE);  Surgeon: Melrose Nakayama, MD;  Location: [place];  Service: Open Heart Surgery;  Laterality: N/A;  . TONSILLECTOMY    . UVULOPALATOPHARYNGOPLASTY      Home Medications    Prior to Admission medications   Medication Sig Start Date End Date Taking? Authorizing Provider  acetaminophen (TYLENOL) 500 MG tablet Take 1,000 mg by mouth every 6 (six) hours as needed for moderate pain or headache.     [provider]  amLODipine (NORVASC) 10 MG tablet Take 10 mg by mouth daily. pm 10/06/16   [provider]  ASPIRIN 81 PO Take by mouth daily.    [provider]  atorvastatin (LIPITOR) 40 MG tablet Take  1 tablet (40 mg total) by mouth daily at 6 PM. 12/30/15   Barrett, Erin R, PA-C  budesonide-formoterol (SYMBICORT) 160-4.5 MCG/ACT inhaler Inhale 2 puffs into the lungs 2 (two) times daily. 09/16/16   Flora Lipps, MD  buPROPion (WELLBUTRIN XL) 300 MG 24 hr tablet Take 300 mg by mouth daily. noon    [provider]  carvedilol (COREG) 12.5 MG tablet Take 12.5 mg by mouth 2 (two) times daily. Am and pm 09/27/16   [provider]  doxepin (SINEQUAN) 25 MG capsule Take 50 mg by mouth daily. pm 08/02/16   [provider]  fluticasone (FLONASE) 50 MCG/ACT nasal spray Place 2 sprays into both nostrils daily. Am and pm    [provider]  gabapentin (NEURONTIN) 300 MG capsule Take 300 mg by mouth 3 (three) times daily. Am, noon, pm 08/02/16   [provider]  Incontinence Supply Disposable (DEPEND ADJUSTABLE UNDERWEAR LG) MISC Wear pads tid 09/19/16   McGowan, Larene Beach A, PA-C  Ipratropium-Albuterol (COMBIVENT RESPIMAT) 20-100 MCG/ACT AERS respimat Inhale 2 puffs into the lungs every 6 (six) hours as needed for wheezing or shortness of breath.    [provider]  Ipratropium-Albuterol (COMBIVENT) 20-100 MCG/ACT AERS respimat Inhale 1 puff into the lungs every 6 (six) hours.  09/11/17   Flora Lipps, MD  JANUMET 50-1000 MG tablet Take 1 tablet by mouth 2 (two) times daily. Am and pm 08/02/16   [provider]  lisinopril (PRINIVIL,ZESTRIL) 20 MG tablet Take 20 mg by mouth 2 (two) times daily. Am and pm 08/02/16   [provider]  LORazepam (ATIVAN) 1 MG tablet Take 1-2 tablets (1-2 mg total) by mouth 2 (two) times daily. Pt takes one tablet in the morning and two at bedtime. Patient taking differently: Take 1-2 mg by mouth See admin instructions. Take 1 mg by mouth in the morning and take 2 mg by mouth at bedtime 01/04/16   Gladstone Lighter, MD  Na Sulfate-K Sulfate-Mg Sulf (SUPREP BOWEL PREP KIT) 17.5-3.13-1.6 GM/177ML SOLN Take 1 kit by mouth as directed. 03/30/17   [name], MD  nitroGLYCERIN (NITROLINGUAL) 0.4 MG/SPRAY spray Place 1 spray under the tongue every 5 (five) minutes x 3 doses as needed for chest pain.    [provider]  pantoprazole (PROTONIX) 40 MG tablet Take 40 mg by mouth daily. am    [provider]  risperidone (RISPERDAL) 4 MG tablet Take 4 mg by mouth at bedtime.  08/02/16   [provider]  sulfamethoxazole-trimethoprim (BACTRIM DS,SEPTRA DS) 800-160 MG tablet Take 1 tablet by mouth 2 (two) times daily for 7 days. 12/10/17 12/17/17  Coral Spikes, DO  SYMBICORT 160-4.5 MCG/ACT inhaler INHALE TWO PUFFS INTO THE LUNGS TWICE A DAY 11/13/17   Flora Lipps, MD  tamsulosin (FLOMAX) 0.4 MG CAPS capsule Take 1 capsule (0.4 mg total) by mouth daily. Patient taking differently: Take 0.4 mg by mouth daily. noon 05/08/17   Zara Council A, PA-C  topiramate (TOPAMAX) 100 MG tablet Take 100 mg by mouth daily. noon    [provider]  traMADol (ULTRAM) 50 MG tablet Take 1 tablet (50 mg total) by mouth every 8 (eight) hours as needed. 07/20/17   Coral Spikes, DO   Family History Family History  Problem Relation Age of Onset  . Heart disease Mother   . Heart disease Brother   . Prostate cancer Father   .  Heart disease Unknown   . Heart disease Brother   .  Kidney disease Neg Hx   . Kidney cancer Neg Hx   . Bladder Cancer Neg Hx    Social History Social History   Tobacco Use  . Smoking status: Former Smoker    Packs/day: 0.50    Years: 15.00    Pack years: 7.50    Types: Cigarettes    Last attempt to quit: 05/18/1999    Years since quitting: 18.5  . Smokeless tobacco: Never Used  . Tobacco comment: quit 17 + years  Substance Use Topics  . Alcohol use: No    Alcohol/week: 0.0 standard drinks  . Drug use: No    Allergies   Morphine and related; Synalgos-dc [aspirin-caff-dihydrocodeine]; and Synchlor a [chlorpheniramine]  Review of Systems Review of Systems  Constitutional: Negative for chills and fever.  Gastrointestinal: Negative.   Genitourinary: Positive for dysuria and frequency.   Physical Exam Triage Vital Signs ED Triage Vitals  Enc Vitals Group     BP 12/10/17 1122 (!) 143/92     Pulse Rate 12/10/17 1122 69     Resp 12/10/17 1122 19     Temp 12/10/17 1122 97.7 F (36.5 C)     Temp Source 12/10/17 1122 Oral     SpO2 12/10/17 1122 99 %     Weight 12/10/17 1124 205 lb 0.4 oz (93 kg)     Height 12/10/17 1124 '6\' 3"'  (1.905 m)     Head Circumference --      Peak Flow --      Pain Score 12/10/17 1124 0     Pain Loc --      Pain Edu? --      Excl. in [place]? --    Updated Vital Signs BP (!) 143/92 (BP Location: Left Arm)   Pulse 69   Temp 97.7 F (36.5 C) (Oral)   Resp 19   Ht '6\' 3"'  (1.905 m)   Wt 93 kg   SpO2 99%   BMI 25.63 kg/m   Visual Acuity Right Eye Distance:   Left Eye Distance:   Bilateral Distance:    Right Eye Near:   Left Eye Near:    Bilateral Near:     Physical Exam  Constitutional: He appears well-developed. No distress.  HENT:  Head: Normocephalic and atraumatic.  Cardiovascular: Normal rate and regular rhythm.  Pulmonary/Chest: Effort normal and breath sounds normal. He has no wheezes. He has no rales.  Abdominal: Soft. He  exhibits no distension. There is no tenderness.  Neurological: He is alert.  Psychiatric: His behavior is normal.  Flat affect.  Depressed mood.  Nursing note and vitals reviewed.  UC Treatments / Results  Labs (all labs ordered are listed, but only abnormal results are displayed) Labs Reviewed  URINALYSIS, COMPLETE (UACMP) WITH MICROSCOPIC - Abnormal; Notable for the following components:      Result Value   APPearance CLOUDY (*)    Hgb urine dipstick TRACE (*)    Nitrite POSITIVE (*)    Leukocytes, UA LARGE (*)    Bacteria, UA MANY (*)    All other components within normal limits  URINE CULTURE    EKG None  Radiology No results found.  Procedures Procedures (including critical care time)  Medications Ordered in UC Medications - No data to display  Initial Impression / Assessment and Plan / UC Course  I have reviewed the triage vital signs and the nursing notes.  Pertinent labs & imaging results that were available during my care of the patient were reviewed  by me and considered in my medical decision making (see chart for details).    67 year old male presents with urinary symptoms.  Urinalysis consistent with UTI.  Has a history of recurrent UTI.  Sending culture.  Placing on Bactrim.  Final Clinical Impressions(s) / UC Diagnoses   Final diagnoses:  Acute cystitis without hematuria   Discharge Instructions   None    ED Prescriptions    Medication Sig Dispense Auth. Provider   sulfamethoxazole-trimethoprim (BACTRIM DS,SEPTRA DS) 800-160 MG tablet Take 1 tablet by mouth 2 (two) times daily for 7 days. 14 tablet Coral Spikes, DO     Controlled Substance Prescriptions [place] Controlled Substance Registry consulted? Not Applicable   Coral Spikes, DO 12/10/17 1212

## 2017-12-10 NOTE — ED Triage Notes (Signed)
Pt with burning with urination x past several days. Feels consistent with past UTI's.

## 2017-12-11 ENCOUNTER — Ambulatory Visit (INDEPENDENT_AMBULATORY_CARE_PROVIDER_SITE_OTHER): Payer: Medicare HMO | Admitting: Internal Medicine

## 2017-12-11 ENCOUNTER — Encounter: Payer: Self-pay | Admitting: Internal Medicine

## 2017-12-11 VITALS — BP 110/62 | HR 69 | Resp 18 | Ht 76.0 in | Wt 204.0 lb

## 2017-12-11 DIAGNOSIS — Z87898 Personal history of other specified conditions: Secondary | ICD-10-CM | POA: Diagnosis not present

## 2017-12-11 DIAGNOSIS — J301 Allergic rhinitis due to pollen: Secondary | ICD-10-CM

## 2017-12-11 DIAGNOSIS — G4733 Obstructive sleep apnea (adult) (pediatric): Secondary | ICD-10-CM

## 2017-12-11 DIAGNOSIS — J449 Chronic obstructive pulmonary disease, unspecified: Secondary | ICD-10-CM | POA: Diagnosis not present

## 2017-12-11 DIAGNOSIS — R05 Cough: Secondary | ICD-10-CM

## 2017-12-11 DIAGNOSIS — R059 Cough, unspecified: Secondary | ICD-10-CM

## 2017-12-11 LAB — URINE CULTURE

## 2017-12-11 NOTE — Progress Notes (Signed)
[facility] [place],  [id number]  Pulmonary Sleep Medicine   Office Visit Note  Patient Name: Roger Pruitt DOB: 1950/06/11 MRN [id number]  Date of Service: 12/11/2017  Complaints/HPI: Patient is here as a new patient.  Has a history of coronary artery disease hypertensive heart disease syncope prior history of sleep apnea and gastritis.  Patient had been seeing another pulmonologist for his breathing issues.  He states now that his insurance has changed he is seeking another pulmonologist.  He states that he has some shortness of breath when he exerts himself.  He also does exhibit some wheezing.  He does have a chronic cough.  He is apparently been on cough syrup for very long time.  States the cough syrup does seem to help him.  As far as his sleep is concerned he has a history of sleep apnea and has a history of a UPPP.  He states that he has not had a follow-up as far as the surgery is concerned.  He also noted that his wife states that he still snores.  Apparently also had a history of 2 mini strokes in the past.  Patient has pulmonary nodules which were followed from 2017 until the end of 2018.  The final recommendations from the radiology report was that no further follow-up is necessary because the nodules have been stable.  He also has had pulmonary functions done which were actually within normal limits.  He does take Symbicort Combivent for his breathing.  He also is on Flonase for his allergies.  ROS  General: (-) fever, (-) chills, (-) night sweats, (-) weakness Skin: (-) rashes, (-) itching,. Eyes: (-) visual changes, (-) redness, (-) itching. Nose and Sinuses: (-) nasal stuffiness or itchiness, (-) postnasal drip, (-) nosebleeds, (-) sinus trouble. Mouth and Throat: (-) sore throat, (-) hoarseness. Neck: (-) swollen glands, (-) enlarged thyroid, (-) neck pain. Respiratory: + cough, (-) bloody sputum, + shortness of breath, +  wheezing. Cardiovascular: - ankle swelling, (-) chest pain. Lymphatic: (-) lymph node enlargement. Neurologic: (-) numbness, (-) tingling. Psychiatric: (-) anxiety, (-) depression   Current Medication: Outpatient Encounter Medications as of 12/11/2017  Medication Sig  . acetaminophen (TYLENOL) 500 MG tablet Take 1,000 mg by mouth every 6 (six) hours as needed for moderate pain or headache.   Marland Kitchen amLODipine (NORVASC) 10 MG tablet Take 10 mg by mouth daily. pm  . ASPIRIN 81 PO Take by mouth daily.  Marland Kitchen atorvastatin (LIPITOR) 40 MG tablet Take 1 tablet (40 mg total) by mouth daily at 6 PM.  . budesonide-formoterol (SYMBICORT) 160-4.5 MCG/ACT inhaler Inhale 2 puffs into the lungs 2 (two) times daily.  Marland Kitchen buPROPion (WELLBUTRIN XL) 300 MG 24 hr tablet Take 300 mg by mouth daily. noon  . carvedilol (COREG) 12.5 MG tablet Take 12.5 mg by mouth 2 (two) times daily. Am and pm  . doxepin (SINEQUAN) 25 MG capsule Take 50 mg by mouth daily. pm  . fluticasone (FLONASE) 50 MCG/ACT nasal spray Place 2 sprays into both nostrils daily. Am and pm  . gabapentin (NEURONTIN) 300 MG capsule Take 300 mg by mouth 3 (three) times daily. Am, noon, pm  . Ipratropium-Albuterol (COMBIVENT RESPIMAT) 20-100 MCG/ACT AERS respimat Inhale 2 puffs into the lungs every 6 (six) hours as needed for wheezing or shortness of breath.  . Ipratropium-Albuterol (COMBIVENT) 20-100 MCG/ACT AERS respimat Inhale 1 puff into the lungs every 6 (six) hours.  Marland Kitchen JANUMET 50-1000 MG tablet Take 1  tablet by mouth 2 (two) times daily. Am and pm  . lisinopril (PRINIVIL,ZESTRIL) 20 MG tablet Take 20 mg by mouth 2 (two) times daily. Am and pm  . LORazepam (ATIVAN) 1 MG tablet Take 1-2 tablets (1-2 mg total) by mouth 2 (two) times daily. Pt takes one tablet in the morning and two at bedtime. (Patient taking differently: Take 1-2 mg by mouth See admin instructions. Take 1 mg by mouth in the morning and take 2 mg by mouth at bedtime)  . nitroGLYCERIN  (NITROLINGUAL) 0.4 MG/SPRAY spray Place 1 spray under the tongue every 5 (five) minutes x 3 doses as needed for chest pain.  . pantoprazole (PROTONIX) 40 MG tablet Take 40 mg by mouth daily. am  . risperidone (RISPERDAL) 4 MG tablet Take 4 mg by mouth at bedtime.   . sulfamethoxazole-trimethoprim (BACTRIM DS,SEPTRA DS) 800-160 MG tablet Take 1 tablet by mouth 2 (two) times daily for 7 days.  . SYMBICORT 160-4.5 MCG/ACT inhaler INHALE TWO PUFFS INTO THE LUNGS TWICE A DAY  . tamsulosin (FLOMAX) 0.4 MG CAPS capsule Take 1 capsule (0.4 mg total) by mouth daily. (Patient taking differently: Take 0.4 mg by mouth daily. noon)  . topiramate (TOPAMAX) 100 MG tablet Take 100 mg by mouth daily. noon  . traMADol (ULTRAM) 50 MG tablet Take 1 tablet (50 mg total) by mouth every 8 (eight) hours as needed.  . Incontinence Supply Disposable (DEPEND ADJUSTABLE UNDERWEAR LG) MISC Wear pads tid  . Na Sulfate-K Sulfate-Mg Sulf (SUPREP BOWEL PREP KIT) 17.5-3.13-1.6 GM/177ML SOLN Take 1 kit by mouth as directed. (Patient not taking: Reported on 12/11/2017)   No facility-administered encounter medications on file as of 12/11/2017.     Surgical History: Past Surgical History:  Procedure Laterality Date  . BACK SURGERY     lumbar disc  . BACK SURGERY  1984  . CARDIAC CATHETERIZATION  6/10;2011;Aug.2012;Oct. 2012;   . CARDIAC CATHETERIZATION  05/2012   [facility]; EF 60%, patent stent in the left circumflex, moderate three-vessel disease with no flow-limiting lesions. Unchanged from most recent catheterization  . CARDIAC CATHETERIZATION N/A 12/10/2015   Procedure: Left Heart Cath and Coronary Angiography;  Surgeon: Corey Skains, MD;  Location: [place] [facility];  Service: Cardiovascular;  Laterality: N/A;  . CARPAL TUNNEL RELEASE  2009   left  . CATARACT EXTRACTION W/PHACO Left 02/14/2017   Procedure: CATARACT EXTRACTION PHACO AND INTRAOCULAR LENS PLACEMENT (IOC);  Surgeon: Birder Robson, MD;  Location: [facility];   Service: Ophthalmology;  Laterality: Left;  [place] 00:42 AP% 16.6 CDE 7.14 Fluid pack lot # [id number]  . CATARACT EXTRACTION W/PHACO Right 02/28/2017   Procedure: CATARACT EXTRACTION PHACO AND INTRAOCULAR LENS PLACEMENT (IOC);  Surgeon: Birder Robson, MD;  Location: [facility];  Service: Ophthalmology;  Laterality: Right;  [place] 01:09 AP% 11.4 CDE 7.98 Fluid pack lot # [id number]  . COLONOSCOPY WITH PROPOFOL N/A 06/29/2017   Procedure: COLONOSCOPY WITH PROPOFOL;  Surgeon: [name], MD;  Location: [place];  Service: Endoscopy;  Laterality: N/A;  Diabetic - oral meds  . CORONARY ANGIOPLASTY  2011 & 2001   s/p stent  . CORONARY ARTERY BYPASS GRAFT     stent placement  . CORONARY ARTERY BYPASS GRAFT N/A 12/25/2015   Procedure: CORONARY ARTERY BYPASS GRAFTING (CABG) x4  , using left internal mammary artery and right leg greater saphenous vein harvested endoscopically LIMA-LAD SVG-OM2 SVG-DIAG SVG-PD;  Surgeon: Melrose Nakayama, MD;  Location: [place];  Service: Open Heart Surgery;  Laterality:  N/A;  . [place]  . CYST REMOVAL LEG Right   . ESOPHAGOGASTRODUODENOSCOPY  06/29/2017   Procedure: ESOPHAGOGASTRODUODENOSCOPY (EGD);  Surgeon: [name], MD;  Location: [place];  Service: Endoscopy;;  . POLYPECTOMY  06/29/2017   Procedure: POLYPECTOMY INTESTINAL;  Surgeon: [name], MD;  Location: [place];  Service: Endoscopy;;  . SKIN GRAFT    . TEE WITHOUT CARDIOVERSION N/A 12/25/2015   Procedure: TRANSESOPHAGEAL ECHOCARDIOGRAM (TEE);  Surgeon: Melrose Nakayama, MD;  Location: [place];  Service: Open Heart Surgery;  Laterality: N/A;  . TONSILLECTOMY    . UVULOPALATOPHARYNGOPLASTY      Medical History: Past Medical History:  Diagnosis Date  . Abdominal pain   . Acute cystitis without hematuria   . Anemia   . Anginal pain ([place])    rarely  . Arthritis    knees and hands  . Asthma   . Bipolar disorder ([place])    BIPOLAR/ SCHIZO/ AFFECTIVE   . BPD  (bronchopulmonary dysplasia)   . BPH (benign prostatic hyperplasia)   . CAD (coronary artery disease)    a. s/p MI 2001, 2012;  b. s/p prior LCX stenting; b. 05/2012 Abnl MV; c. 05/2012 Cath: patent LCX stent w/ otw mod non-obs dzs->Med Rx.  . Carpal tunnel syndrome    surgery both hands  . Depression   . Disc degeneration, lumbar   . Dyspnea    WITH EXERTION  . Elevated PSA   . Esophageal reflux   . GERD (gastroesophageal reflux disease)   . Hematuria    history of gross and microscopic  . Hemorrhoid   . History of balanitis   . History of bladder infections   . History of echocardiogram    a. 07/2014 Echo: EF 60-65%, no rwma, nl LA size, nl RV/PASP.  Marland Kitchen History of urethral stricture   . Hyperlipidemia   . Hypertension    controlled  . Hypertensive heart disease   . Incomplete bladder emptying   . Iron deficiency anemia, unspecified   . Lower urinary tract symptoms   . Myocardial infarction ([place])    X7 LAST 2001  . Neuropathy   . Nocturia   . Obsessive compulsive disorder   . Other dysphagia   . Panic attack   . Parkinson's disease ([place])   . Sleep apnea    SURGICAL TX  . Stroke ([place])   . Type II diabetes mellitus ([place])   . Unspecified vitamin D deficiency   . Urethral stricture   . Urinary retention   . Varicose vein of leg   . Vertigo    hx of  . Vitamin D deficiency   . Wears dentures    upper and lowers    Family History: Family History  Problem Relation Age of Onset  . Heart disease Mother   . Heart disease Brother   . Prostate cancer Father   . Heart disease Unknown   . Heart disease Brother   . Kidney disease Neg Hx   . Kidney cancer Neg Hx   . Bladder Cancer Neg Hx     Social History: Social History   Socioeconomic History  . Marital status: Married    Spouse name: Not on file  . Number of children: Not on file  . Years of education: Not on file  . Highest education level: Not on file  Occupational History  . Not on file  Social Needs  .  Financial resource strain: Not on file  .  Food insecurity:    Worry: Not on file    Inability: Not on file  . Transportation needs:    Medical: Not on file    Non-medical: Not on file  Tobacco Use  . Smoking status: Former Smoker    Packs/day: 0.50    Years: 15.00    Pack years: 7.50    Types: Cigarettes    Last attempt to quit: 05/18/1999    Years since quitting: 18.5  . Smokeless tobacco: Never Used  . Tobacco comment: quit 17 + years  Substance and Sexual Activity  . Alcohol use: No    Alcohol/week: 0.0 standard drinks  . Drug use: No  . Sexual activity: Not on file  Lifestyle  . Physical activity:    Days per week: Not on file    Minutes per session: Not on file  . Stress: Not on file  Relationships  . Social connections:    Talks on phone: Not on file    Gets together: Not on file    Attends religious service: Not on file    Active member of club or organization: Not on file    Attends meetings of clubs or organizations: Not on file    Relationship status: Not on file  . Intimate partner violence:    Fear of current or ex partner: Not on file    Emotionally abused: Not on file    Physically abused: Not on file    Forced sexual activity: Not on file  Other Topics Concern  . Not on file  Social History Narrative   ** Merged History Encounter **        Vital Signs: Blood pressure 110/62, pulse 69, resp. rate 18, height '6\' 4"'  (1.93 m), weight 204 lb (92.5 kg), SpO2 90 %.  Examination: General Appearance: The patient is well-developed, well-nourished, and in no distress. Skin: Gross inspection of skin unremarkable. Head: normocephalic, no gross deformities. Eyes: no gross deformities noted. ENT: ears appear grossly normal no exudates. Neck: Supple. No thyromegaly. No LAD. Respiratory: no rhonchi noted at this time. Cardiovascular: Normal S1 and S2 without murmur or rub. Extremities: No cyanosis. pulses are equal. Neurologic: Alert and oriented. No  involuntary movements.  LABS: Recent Results (from the past 2160 hour(s))  Urinalysis, Complete w Microscopic     Status: Abnormal   Collection Time: 10/07/17 11:07 AM  Result Value Ref Range   Color, Urine YELLOW YELLOW   APPearance CLEAR CLEAR   Specific Gravity, Urine 1.015 1.005 - 1.030   pH 7.0 5.0 - 8.0   Glucose, UA NEGATIVE NEGATIVE mg/dL   Hgb urine dipstick TRACE (A) NEGATIVE   Bilirubin Urine NEGATIVE NEGATIVE   Ketones, ur NEGATIVE NEGATIVE mg/dL   Protein, ur 30 (A) NEGATIVE mg/dL   Nitrite NEGATIVE NEGATIVE   Leukocytes, UA SMALL (A) NEGATIVE   Squamous Epithelial / LPF 0-5 0 - 5   WBC, UA 6-10 0 - 5 WBC/hpf   RBC / HPF 0-5 0 - 5 RBC/hpf   Bacteria, UA NONE SEEN NONE SEEN    Comment: Performed at [facility], [address], [place], [place] [address]  Urine culture     Status: Abnormal   Collection Time: 10/07/17 11:07 AM  Result Value Ref Range   Specimen Description      URINE, CLEAN CATCH Performed at [facility], [address] [place], [place] [address]    Special Requests      NONE Performed at  [facility] [address] [place], [place] [address]    Culture (A)     <10,000 COLONIES/mL INSIGNIFICANT GROWTH Performed at [place] [address] [place], [place] [address]    Report Status 10/09/2017 FINAL   Urinalysis, Complete w Microscopic     Status: Abnormal   Collection Time: 12/10/17 11:27 AM  Result Value Ref Range   Color, Urine YELLOW YELLOW   APPearance CLOUDY (A) CLEAR   Specific Gravity, Urine 1.010 1.005 - 1.030   pH 7.0 5.0 - 8.0   Glucose, UA NEGATIVE NEGATIVE mg/dL   Hgb urine dipstick TRACE (A) NEGATIVE   Bilirubin Urine NEGATIVE NEGATIVE   Ketones, ur NEGATIVE NEGATIVE mg/dL   Protein, ur NEGATIVE NEGATIVE mg/dL   Nitrite POSITIVE (A) NEGATIVE   Leukocytes, UA LARGE (A) NEGATIVE   Squamous Epithelial / LPF NONE SEEN 0 - 5   WBC, UA >50 0 - 5 WBC/hpf   RBC / HPF 0-5 0 - 5  RBC/hpf   Bacteria, UA MANY (A) NONE SEEN    Comment: Performed at [facility], [address] [place], [place] [address]    Radiology: No results found.  No results found.  No results found.    Assessment and Plan: Patient Active Problem List   Diagnosis Date Noted  . Iron deficiency anemia due to chronic blood loss   . Benign neoplasm of ascending colon   . Benign neoplasm of cecum   . Gastritis without bleeding   . 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 Chronic bilateral low back pain without sciatica 04/07/2016  . Syncope 12/31/2015  . Status post coronary artery bypass grafting 12/31/2015  . S/P CABG x 4 12/25/2015  . Unstable angina ([place]) 12/01/2015  . Hypertensive heart disease   . Type II diabetes mellitus ([place])   . Abnormal chest CT 07/10/2015  . Pulmonary nodules 07/09/2015  . Basal cell carcinoma of skin 05/27/2015  . Bipolar disorder ([place]) 05/27/2015  . CAD (coronary artery disease), native coronary artery 05/27/2015  . DDD (degenerative disc disease), lumbosacral 05/27/2015  . Esophageal reflux 05/27/2015  . Benign essential hypertension 05/27/2015  . Sleep apnea 05/27/2015  . Urethral stricture 05/27/2015  . Benign prostatic hypertrophy (BPH) with incomplete bladder emptying 11/29/2014  . CVA (cerebral infarction) 08/23/2014  . Hypertension   . S/P coronary artery stent placement 09/30/2011  . Hyperlipidemia 09/30/2011  . History of myocardial infarction 09/03/2011  . Seasonal allergies 09/03/2011    1. COPD/Asthma he is on symbicort combivent and flonase. He states that they do help with his breathing except he feels that the symbicort does not work well all the time. He does not smoke at this timelast PFT was 2 years ago sugggested a follow up PFT to be done to reassess his lung function. 2. Allergic Rhinitis on flonase continue with present therapy seems to be adequately controlled 3. Pulmonary nodules have demonstarted stability  over the last 2 years since 2017 July.  At this time will defer a CT scan based on the recommendations by radiology from December 2018 4. Cough he had been on codeine syrup right now his cough is controlled 5. OSA had a UPPP done he is not sure when. He states he has not had any follow up as far as his OSA is concerned. He states that he does still snore and also states that he does have EDS and sleeps and naps during the daytime. Suggested getting  a followup HST.  I recommended doing a in lab study however he stated that he would prefer to come as an outpatient and have the study done.  General Counseling: I have discussed the findings of the evaluation and examination with Roger Pruitt.  I have also discussed any further diagnostic evaluation thatmay be needed or ordered today. Roger Pruitt verbalizes understanding of the findings of todays visit. We also reviewed his medications today and discussed drug interactions and side effects including but not limited excessive drowsiness and altered mental states. We also discussed that there is always a risk not just to him but also people around him. he has been encouraged to call the office with any questions or concerns that should arise related to todays visit.    Time spent: 25mn  I have personally obtained a history, examined the patient, evaluated laboratory and imaging results, formulated the assessment and plan and placed orders.    SAllyne Gee MD FAdvanced Surgery Center Of Lancaster LLCPulmonary and Critical Care Sleep medicine

## 2017-12-11 NOTE — Patient Instructions (Signed)

## 2017-12-14 ENCOUNTER — Telehealth: Payer: Self-pay

## 2018-01-02 ENCOUNTER — Other Ambulatory Visit: Payer: Self-pay | Admitting: Internal Medicine

## 2018-01-10 NOTE — Telephone Encounter (Signed)
error 

## 2018-01-18 ENCOUNTER — Other Ambulatory Visit: Payer: Self-pay | Admitting: Internal Medicine

## 2018-02-06 ENCOUNTER — Ambulatory Visit: Payer: Self-pay | Admitting: Adult Health

## 2018-02-07 ENCOUNTER — Ambulatory Visit: Payer: Self-pay | Admitting: Internal Medicine

## 2018-02-20 ENCOUNTER — Encounter: Payer: Self-pay | Admitting: Emergency Medicine

## 2018-02-20 ENCOUNTER — Other Ambulatory Visit: Payer: Self-pay

## 2018-02-20 ENCOUNTER — Ambulatory Visit
Admission: EM | Admit: 2018-02-20 | Discharge: 2018-02-20 | Disposition: A | Discharge status: Home / Self Care | Payer: Medicare HMO | Attending: Family Medicine | Admitting: Family Medicine

## 2018-02-20 DIAGNOSIS — I10 Essential (primary) hypertension: Secondary | ICD-10-CM

## 2018-02-20 DIAGNOSIS — G20A1 Parkinson's disease without dyskinesia, without mention of fluctuations: Secondary | ICD-10-CM

## 2018-02-20 DIAGNOSIS — N390 Urinary tract infection, site not specified: Secondary | ICD-10-CM | POA: Diagnosis not present

## 2018-02-20 DIAGNOSIS — R319 Hematuria, unspecified: Secondary | ICD-10-CM

## 2018-02-20 DIAGNOSIS — G20 Parkinson's disease: Secondary | ICD-10-CM | POA: Diagnosis not present

## 2018-02-20 LAB — URINALYSIS, COMPLETE (UACMP) WITH MICROSCOPIC
BILIRUBIN URINE: NEGATIVE
GLUCOSE, UA: NEGATIVE mg/dL
NITRITE: NEGATIVE
PH: 7 (ref 5.0–8.0)
Protein, ur: 30 mg/dL — AB
Specific Gravity, Urine: 1.02 (ref 1.005–1.030)
WBC, UA: >50 WBC/hpf (ref 0–5)

## 2018-02-20 MED ORDER — CLONIDINE HCL 0.2 MG PO TABS: 0.2000 mg | ORAL_TABLET | Freq: Once | ORAL | Status: DC

## 2018-02-20 MED ORDER — SULFAMETHOXAZOLE-TRIMETHOPRIM 800-160 MG PO TABS: 1.0000 | ORAL_TABLET | Freq: Two times a day (BID) | ORAL | 0 refills | Status: DC

## 2018-02-20 NOTE — ED Triage Notes (Signed)
Patient states that he has some pain in both his arms and elevated blood pressure this morning.  Patient also reports that he has also had increase in urinary urgency and frequency that started 2 days ago.

## 2018-02-20 NOTE — Discharge Instructions (Signed)
Go to Emergency Department if symptoms worsen °

## 2018-02-22 LAB — URINE CULTURE

## 2018-02-28 ENCOUNTER — Encounter: Payer: Self-pay | Admitting: Emergency Medicine

## 2018-02-28 ENCOUNTER — Other Ambulatory Visit: Payer: Self-pay

## 2018-02-28 ENCOUNTER — Ambulatory Visit
Admission: EM | Admit: 2018-02-28 | Discharge: 2018-02-28 | Disposition: A | Discharge status: Home / Self Care | Payer: Medicare HMO | Attending: Family Medicine | Admitting: Family Medicine

## 2018-02-28 DIAGNOSIS — R251 Tremor, unspecified: Secondary | ICD-10-CM

## 2018-02-28 NOTE — ED Triage Notes (Signed)
Patient c/o hand shaking over 1 month.

## 2018-02-28 NOTE — ED Provider Notes (Signed)
[facility]    CSN: [id number] Arrival date & time: 02/28/18  4076     History   Chief Complaint Chief Complaint  Patient presents with  . Shaking    HPI Roger Pruitt is a 67 y.o. male.   67 yo male with a h/o parkinson symptoms, presents with wife with a concern of "shaking" for months. Per wife, patient has seen 2 neurologist about 2 years ago and per wife her understanding is that he doesn't have Parkinson's disease and hasn't been prescribed any medication for parkinson's disease.  (Note: 1st neurologist diagnosed with primary parkinsonism and 2nd neurologist diagnosed with parkinsonism due to drug). No other complaints at this time.   The history is provided by the spouse.    Past Medical History:  Diagnosis Date  . Abdominal pain   . Acute cystitis without hematuria   . Anemia   . Anginal pain ([place])    rarely  . Arthritis    knees and hands  . Asthma   . Bipolar disorder ([place])    BIPOLAR/ SCHIZO/ AFFECTIVE   . BPD (bronchopulmonary dysplasia)   . BPH (benign prostatic hyperplasia)   . CAD (coronary artery disease)    a. s/p MI 2001, 2012;  b. s/p prior LCX stenting; b. 05/2012 Abnl MV; c. 05/2012 Cath: patent LCX stent w/ otw mod non-obs dzs->Med Rx.  . Carpal tunnel syndrome    surgery both hands  . Depression   . Disc degeneration, lumbar   . Dyspnea    WITH EXERTION  . Elevated PSA   . Esophageal reflux   . GERD (gastroesophageal reflux disease)   . Hematuria    history of gross and microscopic  . Hemorrhoid   . History of balanitis   . History of bladder infections   . History of echocardiogram    a. 07/2014 Echo: EF 60-65%, no rwma, nl LA size, nl RV/PASP.  Marland Kitchen History of urethral stricture   . Hyperlipidemia   . Hypertension    controlled  . Hypertensive heart disease   . Incomplete bladder emptying   . Iron deficiency anemia, unspecified   . Lower urinary tract symptoms   . Myocardial infarction ([place])    X7 LAST 2001  .  Neuropathy   . Nocturia   . Obsessive compulsive disorder   . Other dysphagia   . Panic attack   . Parkinson's disease ([place])   . Sleep apnea    SURGICAL TX  . Stroke ([place])   . Type II diabetes mellitus ([place])   . Unspecified vitamin D deficiency   . Urethral stricture   . Urinary retention   . Varicose vein of leg   . Vertigo    hx of  . Vitamin D deficiency   . Wears dentures    upper and lowers    Patient Active Problem List   Diagnosis Date Noted  . Iron deficiency anemia due to chronic blood loss   . Benign neoplasm of ascending colon   . Benign neoplasm of cecum   . Gastritis without bleeding   . Parkinsonism due to drug ([place]) 07/22/2016  . Chronic bilateral low back pain without sciatica 04/07/2016  . Syncope 12/31/2015  . Status post coronary artery bypass grafting 12/31/2015  . S/P CABG x 4 12/25/2015  . Unstable angina ([place]) 12/01/2015  . Hypertensive heart disease   . Type II diabetes mellitus ([place])   . Abnormal chest CT 07/10/2015  . Pulmonary nodules 07/09/2015  .  Basal cell carcinoma of skin 05/27/2015  . Bipolar disorder ([place]) 05/27/2015  . CAD (coronary artery disease), native coronary artery 05/27/2015  . DDD (degenerative disc disease), lumbosacral 05/27/2015  . Esophageal reflux 05/27/2015  . Benign essential hypertension 05/27/2015  . Sleep apnea 05/27/2015  . Urethral stricture 05/27/2015  . Benign prostatic hypertrophy (BPH) with incomplete bladder emptying 11/29/2014  . CVA (cerebral infarction) 08/23/2014  . Hypertension   . S/P coronary artery stent placement 09/30/2011  . Hyperlipidemia 09/30/2011  . History of myocardial infarction 09/03/2011  . Seasonal allergies 09/03/2011    Past Surgical History:  Procedure Laterality Date  . BACK SURGERY     lumbar disc  . BACK SURGERY  1984  . CARDIAC CATHETERIZATION  6/10;2011;Aug.2012;Oct. 2012;   . CARDIAC CATHETERIZATION  05/2012   [facility]; EF 60%, patent stent in the left circumflex,  moderate three-vessel disease with no flow-limiting lesions. Unchanged from most recent catheterization  . CARDIAC CATHETERIZATION N/A 12/10/2015   Procedure: Left Heart Cath and Coronary Angiography;  Surgeon: Corey Skains, MD;  Location: [facility];  Service: Cardiovascular;  Laterality: N/A;  . CARPAL TUNNEL RELEASE  2009   left  . CATARACT EXTRACTION W/PHACO Left 02/14/2017   Procedure: CATARACT EXTRACTION PHACO AND INTRAOCULAR LENS PLACEMENT (IOC);  Surgeon: Birder Robson, MD;  Location: [facility];  Service: Ophthalmology;  Laterality: Left;  [place] 00:42 AP% 16.6 CDE 7.14 Fluid pack lot # [id number]  . CATARACT EXTRACTION W/PHACO Right 02/28/2017   Procedure: CATARACT EXTRACTION PHACO AND INTRAOCULAR LENS PLACEMENT (IOC);  Surgeon: Birder Robson, MD;  Location: [facility];  Service: Ophthalmology;  Laterality: Right;  [place] 01:09 AP% 11.4 CDE 7.98 Fluid pack lot # [id number]  . COLONOSCOPY WITH PROPOFOL N/A 06/29/2017   Procedure: COLONOSCOPY WITH PROPOFOL;  Surgeon: [name], MD;  Location: [place];  Service: Endoscopy;  Laterality: N/A;  Diabetic - oral meds  . CORONARY ANGIOPLASTY  2011 & 2001   s/p stent  . CORONARY ARTERY BYPASS GRAFT     stent placement  . CORONARY ARTERY BYPASS GRAFT N/A 12/25/2015   Procedure: CORONARY ARTERY BYPASS GRAFTING (CABG) x4  , using left internal mammary artery and right leg greater saphenous vein harvested endoscopically LIMA-LAD SVG-OM2 SVG-DIAG SVG-PD;  Surgeon: Melrose Nakayama, MD;  Location: [place];  Service: Open Heart Surgery;  Laterality: N/A;  . [place]  . CYST REMOVAL LEG Right   . ESOPHAGOGASTRODUODENOSCOPY  06/29/2017   Procedure: ESOPHAGOGASTRODUODENOSCOPY (EGD);  Surgeon: [name], MD;  Location: [place];  Service: Endoscopy;;  . POLYPECTOMY  06/29/2017   Procedure: POLYPECTOMY INTESTINAL;  Surgeon: [name], MD;  Location: [place];  Service: Endoscopy;;  . SKIN GRAFT     . TEE WITHOUT CARDIOVERSION N/A 12/25/2015   Procedure: TRANSESOPHAGEAL ECHOCARDIOGRAM (TEE);  Surgeon: Melrose Nakayama, MD;  Location: [place];  Service: Open Heart Surgery;  Laterality: N/A;  . TONSILLECTOMY    . UVULOPALATOPHARYNGOPLASTY         Home Medications    Prior to Admission medications   Medication Sig Start Date End Date Taking? Authorizing Provider  acetaminophen (TYLENOL) 500 MG tablet Take 1,000 mg by mouth every 6 (six) hours as needed for moderate pain or headache.    Yes [provider]  amLODipine (NORVASC) 10 MG tablet Take 10 mg by mouth daily. pm 10/06/16  Yes [provider]  ASPIRIN 81 PO Take by mouth daily.   Yes [provider]  atorvastatin (LIPITOR) 40 MG tablet Take 1 tablet (40 mg total) by mouth daily at 6 PM. 12/30/15  Yes Barrett, Erin R, PA-C  budesonide-formoterol (SYMBICORT) 160-4.5 MCG/ACT inhaler Inhale 2 puffs into the lungs 2 (two) times daily. 09/16/16  Yes Kasa, Maretta Bees, MD  buPROPion (WELLBUTRIN XL) 300 MG 24 hr tablet Take 300 mg by mouth daily. noon   Yes [provider]  carvedilol (COREG) 12.5 MG tablet Take 12.5 mg by mouth 2 (two) times daily. Am and pm 09/27/16  Yes [provider]  doxepin (SINEQUAN) 25 MG capsule Take 50 mg by mouth daily. pm 08/02/16  Yes [provider]  fluticasone (FLONASE) 50 MCG/ACT nasal spray Place 2 sprays into both nostrils daily. Am and pm   Yes [provider]  gabapentin (NEURONTIN) 300 MG capsule Take 300 mg by mouth 3 (three) times daily. Am, noon, pm 08/02/16  Yes [provider]  Incontinence Supply Disposable (DEPEND ADJUSTABLE UNDERWEAR LG) MISC Wear pads tid 09/19/16  Yes McGowan, Shannon A, PA-C  Ipratropium-Albuterol (COMBIVENT RESPIMAT) 20-100 MCG/ACT AERS respimat Inhale 2 puffs into the lungs every 6 (six) hours as needed for wheezing or shortness of breath.   Yes [provider]  Ipratropium-Albuterol (COMBIVENT) 20-100  MCG/ACT AERS respimat Inhale 1 puff into the lungs every 6 (six) hours. 09/11/17  Yes Kasa, Maretta Bees, MD  JANUMET 50-1000 MG tablet Take 1 tablet by mouth 2 (two) times daily. Am and pm 08/02/16  Yes [provider]  lisinopril (PRINIVIL,ZESTRIL) 20 MG tablet Take 20 mg by mouth 2 (two) times daily. Am and pm 08/02/16  Yes [provider]  LORazepam (ATIVAN) 1 MG tablet Take 1-2 tablets (1-2 mg total) by mouth 2 (two) times daily. Pt takes one tablet in the morning and two at bedtime. Patient taking differently: Take 1-2 mg by mouth See admin instructions. Take 1 mg by mouth in the morning and take 2 mg by mouth at bedtime 01/04/16  Yes Gladstone Lighter, MD  Na Sulfate-K Sulfate-Mg Sulf (SUPREP BOWEL PREP KIT) 17.5-3.13-1.6 GM/177ML SOLN Take 1 kit by mouth as directed. 03/30/17  Yes [name], MD  nitroGLYCERIN (NITROLINGUAL) 0.4 MG/SPRAY spray Place 1 spray under the tongue every 5 (five) minutes x 3 doses as needed for chest pain.   Yes [provider]  pantoprazole (PROTONIX) 40 MG tablet Take 40 mg by mouth daily. am   Yes [provider]  risperidone (RISPERDAL) 4 MG tablet Take 4 mg by mouth at bedtime.  08/02/16  Yes [provider]  sulfamethoxazole-trimethoprim (BACTRIM DS,SEPTRA DS) 800-160 MG tablet Take 1 tablet by mouth 2 (two) times daily. 02/20/18  Yes Norval Gable, MD  SYMBICORT 160-4.5 MCG/ACT inhaler INHALE TWO PUFFS INTO THE LUNGS TWICE A DAY 01/02/18  Yes Flora Lipps, MD  tamsulosin (FLOMAX) 0.4 MG CAPS capsule Take 1 capsule (0.4 mg total) by mouth daily. Patient taking differently: Take 0.4 mg by mouth daily. noon 05/08/17  Yes McGowan, Larene [place] A, PA-C  topiramate (TOPAMAX) 100 MG tablet Take 100 mg by mouth daily. noon   Yes [provider]  traMADol (ULTRAM) 50 MG tablet Take 1 tablet (50 mg total) by mouth every 8 (eight) hours as needed. 07/20/17  Yes Coral Spikes, DO    Family History Family History  Problem Relation Age  of Onset  . Heart disease Mother   . Heart disease Brother   . Prostate cancer Father   . Heart disease Unknown   . Heart disease  Brother   . Kidney disease Neg Hx   . Kidney cancer Neg Hx   . Bladder Cancer Neg Hx     Social History Social History   Tobacco Use  . Smoking status: Former Smoker    Packs/day: 0.50    Years: 15.00    Pack years: 7.50    Types: Cigarettes    Last attempt to quit: 05/18/1999    Years since quitting: 18.7  . Smokeless tobacco: Never Used  . Tobacco comment: quit 17 + years  Substance Use Topics  . Alcohol use: No    Alcohol/week: 0.0 standard drinks  . Drug use: No     Allergies   Morphine and related; Synalgos-dc [aspirin-caff-dihydrocodeine]; and Synchlor a [chlorpheniramine]   Review of Systems Review of Systems   Physical Exam Triage Vital Signs ED Triage Vitals  Enc Vitals Group     BP 02/28/18 0904 140/90     Pulse Rate 02/28/18 0904 (!) 51     Resp 02/28/18 0904 18     Temp 02/28/18 0904 98.1 F (36.7 C)     Temp Source 02/28/18 0904 Oral     SpO2 02/28/18 0904 100 %     Weight 02/28/18 0903 197 lb (89.4 kg)     Height 02/28/18 0903 _0  (1.905 m)     Head Circumference --      Peak Flow --      Pain Score 02/28/18 0903 0     Pain Loc --      Pain Edu? --      Excl. in [place]? --    No data found.  Updated Vital Signs BP 140/90 (BP Location: Left Arm)   Pulse (!) 51   Temp 98.1 F (36.7 C) (Oral)   Resp 18   Ht _1  (1.905 m)   Wt 89.4 kg   SpO2 100%   BMI 24.62 kg/m   Visual Acuity Right Eye Distance:   Left Eye Distance:   Bilateral Distance:    Right Eye Near:   Left Eye Near:    Bilateral Near:     Physical Exam  Constitutional: He is oriented to person, place, and time. He appears well-developed and well-nourished. No distress.  Neurological: He is alert and oriented to person, place, and time.  Resting tremor  Skin: He is not diaphoretic.  Nursing note and vitals reviewed.    UC  Treatments / Results  Labs (all labs ordered are listed, but only abnormal results are displayed) Labs Reviewed - No data to display  EKG None  Radiology No results found.  Procedures Procedures (including critical care time)  Medications Ordered in UC Medications - No data to display  Initial Impression / Assessment and Plan / UC Course  I have reviewed the triage vital signs and the nursing notes.  Pertinent labs & imaging results that were available during my care of the patient were reviewed by me and considered in my medical decision making (see chart for details).      Final Clinical Impressions(s) / UC Diagnoses   Final diagnoses:  Tremor     Discharge Instructions     Follow up with neurologist as soon as possible    ED Prescriptions    None      1. diagnosis reviewed with patient and wife 2. Recommend patient follow up with his neurologist as soon as possible for further evaluation/clarification on diagnosis   Controlled Substance Prescriptions [place] Controlled Substance Registry consulted?  Not Applicable   Norval Gable, MD 02/28/18 1154

## 2018-02-28 NOTE — Discharge Instructions (Addendum)
Follow up with neurologist as soon as possible

## 2018-03-09 ENCOUNTER — Ambulatory Visit: Payer: Self-pay | Admitting: Internal Medicine

## 2018-03-29 ENCOUNTER — Ambulatory Visit (INDEPENDENT_AMBULATORY_CARE_PROVIDER_SITE_OTHER): Payer: Medicare Other | Admitting: Internal Medicine

## 2018-03-29 ENCOUNTER — Encounter: Payer: Self-pay | Admitting: Internal Medicine

## 2018-03-29 VITALS — BP 160/100 | HR 72 | Resp 16 | Ht 74.0 in | Wt 192.0 lb

## 2018-03-29 DIAGNOSIS — J449 Chronic obstructive pulmonary disease, unspecified: Secondary | ICD-10-CM | POA: Diagnosis not present

## 2018-03-29 MED ORDER — BUDESONIDE-FORMOTEROL FUMARATE 160-4.5 MCG/ACT IN AERO: 2.0000 | INHALATION_SPRAY | Freq: Two times a day (BID) | RESPIRATORY_TRACT | 0 refills | Status: DC

## 2018-03-29 NOTE — Addendum Note (Signed)
Addended by: Stephanie Coup on: 03/29/2018 11:35 AM   Modules accepted: Orders

## 2018-03-29 NOTE — Patient Instructions (Signed)
Continue inhalers as prescribed 

## 2018-03-29 NOTE — Progress Notes (Signed)
[facility]ation      MRN# [id number] Roger Pruitt May 08, 1950   CC: Chief Complaint  Patient presents with  . COPD    pt here for scheduled f/u with his spouse who also has appt.      Brief History: 68 yo male former smoker seen for Multiple pulmonary nodules.   CC follow up COPD   HPI Patient has chronic cough  chronic shortness of breath and chronic dyspnea on exertion No signs of exacerbation at this time  Patient with history of quadruple coronary artery bypass surgery  No signs of infection at this time No signs of exacerbation at this time No signs of heart failure at this time  Pulmonary nodules have been stable since 2017  Patient is a deconditioned state Previous PFT shows scooping of the expiratory limb suggestive of obstruction    Review of Systems  Constitutional: Negative for chills and fever.  Eyes: Negative for blurred vision.  Respiratory: Positive for cough and shortness of breath. Negative for hemoptysis, sputum production and wheezing.   Cardiovascular: Positive for orthopnea. Negative for chest pain and palpitations.  Neurological: Negative for dizziness and headaches.  Endo/Heme/Allergies: Does not bruise/bleed easily.  Psychiatric/Behavioral: Negative for depression and substance abuse.      Allergies:  Morphine and related; Synalgos-dc [aspirin-caff-dihydrocodeine]; and Synchlor a [chlorpheniramine]  Physical Examination:  VS: BP (!) 160/100 (BP Location: Left Arm, Cuff Size: Normal)   Pulse 72   Resp 16   Ht 6\' 2"  (1.88 m)   Wt 192 lb (87.1 kg)   SpO2 99%   BMI 24.65 kg/m   Physical Examination:   GENERAL:NAD, no fevers, chills, no weakness no fatigue HEAD: Normocephalic, atraumatic.  EYES: Pupils equal, round, reactive to light. Extraocular muscles intact. No scleral icterus.  MOUTH: Moist mucosal membrane. Dentition intact. No abscess noted.  EAR, NOSE, THROAT: Clear without exudates. No  external lesions.  NECK: Supple. No thyromegaly. No nodules. No JVD.  PULMONARY: CTA B/L no wheezing, rhonchi, crackles CARDIOVASCULAR: S1 and S2. Regular rate and rhythm. No murmurs, rubs, or gallops. No edema. Pedal pulses 2+ bilaterally.  GASTROINTESTINAL: Soft, nontender, nondistended. No masses. Positive bowel sounds. No hepatosplenomegaly.  MUSCULOSKELETAL: No swelling, clubbing, or edema. Range of motion full in all extremities.  NEUROLOGIC: Cranial nerves II through XII are intact. No gross focal neurological deficits. Sensation intact. Reflexes intact.  SKIN: No ulceration, lesions, rashes, or cyanosis. Skin warm and dry. Turgor intact.  PSYCHIATRIC: Mood, affect within normal limits. The patient is awake, alert and oriented x 3. Insight, judgment intact.  ALL OTHER ROS ARE NEGATIVE        CT Chest 02/2018 I have Independently reviewed images of  CT chest   Interpretation: Stable small solid appearing pulmonary nodules. The largest measures 6 mm. Stable since 2017  Assessment and Plan:   68 year old male with multiple bilateral benign subcentimeter pulmonary nodules seen for follow-up visit in the setting of chronic shortness of breath and chronic dyspnea on exertion in the setting of COPD-COPD seems to be stable at this time  Pulmonary nodules Last office visit CT chest showed benign findings of pulmonary nodules No further CT scans needed at this time  Dyspnea likely related to deconditioned state and mild COPD Pulmonary function testing shows scooping of the expiratory limb showing signs of obstruction Continue Symbicort as prescribed as this helps his breathing Combivent as needed  Allergic rhinitis continue Flonase as prescribed   Follow-up in 6 months  Corrin Parker, M.D.  [facility]  Medical Director [place] Director [facility] Cardio-Pulmonary Department

## 2018-04-11 DIAGNOSIS — F250 Schizoaffective disorder, bipolar type: Secondary | ICD-10-CM | POA: Diagnosis not present

## 2018-05-01 DIAGNOSIS — F329 Major depressive disorder, single episode, unspecified: Secondary | ICD-10-CM | POA: Diagnosis not present

## 2018-05-01 DIAGNOSIS — E785 Hyperlipidemia, unspecified: Secondary | ICD-10-CM | POA: Diagnosis not present

## 2018-05-01 DIAGNOSIS — R739 Hyperglycemia, unspecified: Secondary | ICD-10-CM | POA: Diagnosis not present

## 2018-05-01 DIAGNOSIS — R300 Dysuria: Secondary | ICD-10-CM | POA: Diagnosis not present

## 2018-05-01 DIAGNOSIS — D509 Iron deficiency anemia, unspecified: Secondary | ICD-10-CM | POA: Diagnosis not present

## 2018-05-01 DIAGNOSIS — I10 Essential (primary) hypertension: Secondary | ICD-10-CM | POA: Diagnosis not present

## 2018-05-01 DIAGNOSIS — R079 Chest pain, unspecified: Secondary | ICD-10-CM | POA: Diagnosis not present

## 2018-05-01 DIAGNOSIS — N400 Enlarged prostate without lower urinary tract symptoms: Secondary | ICD-10-CM | POA: Diagnosis not present

## 2018-05-04 ENCOUNTER — Telehealth: Payer: Self-pay | Admitting: Gastroenterology

## 2018-05-04 NOTE — Telephone Encounter (Signed)
Pt is calling to schedule a colonoscopy  He wants to let Sharyn Lull know he can do the 05/25/18 he will have a ride that day

## 2018-05-07 ENCOUNTER — Other Ambulatory Visit: Payer: Self-pay

## 2018-05-07 NOTE — Telephone Encounter (Signed)
Reviewed patients chart with Ginger.  Patient is not due until 5 year. His previous colonoscopy was performed with Dr. Allen Norris on 06/29/17.  Thanks [employer]

## 2018-05-21 DIAGNOSIS — M5416 Radiculopathy, lumbar region: Secondary | ICD-10-CM | POA: Diagnosis not present

## 2018-05-21 DIAGNOSIS — M5136 Other intervertebral disc degeneration, lumbar region: Secondary | ICD-10-CM | POA: Diagnosis not present

## 2018-05-21 DIAGNOSIS — M5126 Other intervertebral disc displacement, lumbar region: Secondary | ICD-10-CM | POA: Diagnosis not present

## 2018-05-21 DIAGNOSIS — M48062 Spinal stenosis, lumbar region with neurogenic claudication: Secondary | ICD-10-CM | POA: Diagnosis not present

## 2018-05-22 DIAGNOSIS — N390 Urinary tract infection, site not specified: Secondary | ICD-10-CM | POA: Diagnosis not present

## 2018-06-11 DIAGNOSIS — F250 Schizoaffective disorder, bipolar type: Secondary | ICD-10-CM | POA: Diagnosis not present

## 2018-06-13 DIAGNOSIS — I25118 Atherosclerotic heart disease of native coronary artery with other forms of angina pectoris: Secondary | ICD-10-CM | POA: Diagnosis not present

## 2018-06-13 DIAGNOSIS — E782 Mixed hyperlipidemia: Secondary | ICD-10-CM | POA: Diagnosis not present

## 2018-06-13 DIAGNOSIS — I10 Essential (primary) hypertension: Secondary | ICD-10-CM | POA: Diagnosis not present

## 2018-06-22 ENCOUNTER — Other Ambulatory Visit: Payer: Self-pay | Admitting: Urology

## 2018-06-26 ENCOUNTER — Telehealth (INDEPENDENT_AMBULATORY_CARE_PROVIDER_SITE_OTHER): Payer: Medicare Other | Admitting: Urology

## 2018-06-26 ENCOUNTER — Telehealth: Payer: Self-pay | Admitting: Urology

## 2018-06-26 ENCOUNTER — Other Ambulatory Visit: Payer: Self-pay

## 2018-06-26 DIAGNOSIS — N401 Enlarged prostate with lower urinary tract symptoms: Secondary | ICD-10-CM

## 2018-06-26 DIAGNOSIS — Z87448 Personal history of other diseases of urinary system: Secondary | ICD-10-CM

## 2018-06-26 MED ORDER — TAMSULOSIN HCL 0.4 MG PO CAPS: 0.4000 mg | ORAL_CAPSULE | Freq: Every day | ORAL | 3 refills | Status: DC

## 2018-06-26 NOTE — Telephone Encounter (Signed)
Please call Roger Pruitt and have him make an appointment in three months for an I PSS, PVR and UA.

## 2018-06-26 NOTE — Progress Notes (Signed)
Virtual Visit via Telephone Note  I connected with Roger Pruitt on 06/26/2018 at 1209 by telephone and verified that I am speaking with the correct person using two identifiers.  They are located at in car.  I am located at my home.    This visit type was conducted due to national recommendations for restrictions regarding the COVID-19 Pandemic (e.g. social distancing).  This format is felt to be most appropriate for this patient at this time.  All issues noted in this document were discussed and addressed.  No physical exam was performed.   I discussed the limitations, risks, security and privacy concerns of performing an evaluation and management service by telephone and the availability of in person appointments. I also discussed with the patient that there may be a patient responsible charge related to this service. The patient expressed understanding and agreed to proceed.   History of Present Illness: Roger Pruitt is a 68 year old male with a history of BPH with LUTS with a past episode of urinary retention who is in need of refill on his tamsulosin 0.4 mg daily.  He states he has not had to cath himself for quite some time.  He is urinating with a fairly strong stream and feels that he is emptying his bladder completely.  He denies having to strain to urinate, urinary hesitancy or intermittency, incontinence or blood in the urine.  Patient denies any dysuria or suprapubic/flank pain.  Patient denies any fevers, chills, nausea or vomiting.    Observations/Objective: tamsulosin 0.4 mg daily   Assessment and Plan:  1. BPH with LU TS Prescription for Flomax 0.4 mg daily sent to his pharmacy  2. History of hematuria Hematuria work up completed in 05/2015- findings positive for NED No report of gross hematuria   Follow Up Instructions:  Patient will return to the office in 3 months for I PSS score, PVR and UA recheck  I discussed the assessment and treatment plan with the patient. The  patient was provided an opportunity to ask questions and all were answered. The patient agreed with the plan and demonstrated an understanding of the instructions.   The patient was advised to call back or seek an in-person evaluation if the symptoms worsen or if the condition fails to improve as anticipated.  I provided 4 minutes of non-face-to-face time during this encounter.   Detra Bores, PA-C

## 2018-07-07 ENCOUNTER — Ambulatory Visit
Admission: EM | Admit: 2018-07-07 | Discharge: 2018-07-07 | Disposition: A | Discharge status: Home / Self Care | Payer: Medicare Other | Attending: Family Medicine | Admitting: Family Medicine

## 2018-07-07 ENCOUNTER — Encounter: Payer: Self-pay | Admitting: Gynecology

## 2018-07-07 ENCOUNTER — Other Ambulatory Visit: Payer: Self-pay

## 2018-07-07 DIAGNOSIS — J302 Other seasonal allergic rhinitis: Secondary | ICD-10-CM

## 2018-07-07 DIAGNOSIS — N390 Urinary tract infection, site not specified: Secondary | ICD-10-CM

## 2018-07-07 DIAGNOSIS — I10 Essential (primary) hypertension: Secondary | ICD-10-CM | POA: Diagnosis not present

## 2018-07-07 DIAGNOSIS — R319 Hematuria, unspecified: Secondary | ICD-10-CM

## 2018-07-07 LAB — URINALYSIS, COMPLETE (UACMP) WITH MICROSCOPIC
Bilirubin Urine: NEGATIVE
Glucose, UA: NEGATIVE mg/dL
Ketones, ur: NEGATIVE mg/dL
Nitrite: POSITIVE — AB
Protein, ur: NEGATIVE mg/dL
Specific Gravity, Urine: 1.015 (ref 1.005–1.030)
Squamous Epithelial / HPF: NONE SEEN (ref 0–5)
pH: 6.5 (ref 5.0–8.0)

## 2018-07-07 MED ORDER — SULFAMETHOXAZOLE-TRIMETHOPRIM 800-160 MG PO TABS: 1.0000 | ORAL_TABLET | Freq: Two times a day (BID) | ORAL | 0 refills | Status: DC

## 2018-07-07 NOTE — ED Provider Notes (Signed)
[facility]    CSN: [id number] Arrival date & time: 07/07/18  1021     History   Chief Complaint No chief complaint on file.   HPI Roger Pruitt is a 68 y.o. male.   68 yo male with a c/o burning with urination x 3 days. Denies any fevers, chills, nausea, vomiting. Has a h/o UTIs but none since last November.  States he's had some allergy symptoms. Has not taken his blood pressure medicine today.    The history is provided by the patient.    Past Medical History:  Diagnosis Date  . Abdominal pain   . Acute cystitis without hematuria   . Anemia   . Anginal pain ([place])    rarely  . Arthritis    knees and hands  . Asthma   . Bipolar disorder ([place])    BIPOLAR/ SCHIZO/ AFFECTIVE   . BPD (bronchopulmonary dysplasia)   . BPH (benign prostatic hyperplasia)   . CAD (coronary artery disease)    a. s/p MI 2001, 2012;  b. s/p prior LCX stenting; b. 05/2012 Abnl MV; c. 05/2012 Cath: patent LCX stent w/ otw mod non-obs dzs->Med Rx.  . Carpal tunnel syndrome    surgery both hands  . Depression   . Disc degeneration, lumbar   . Dyspnea    WITH EXERTION  . Elevated PSA   . Esophageal reflux   . GERD (gastroesophageal reflux disease)   . Hematuria    history of gross and microscopic  . Hemorrhoid   . History of balanitis   . History of bladder infections   . History of echocardiogram    a. 07/2014 Echo: EF 60-65%, no rwma, nl LA size, nl RV/PASP.  Marland Kitchen History of urethral stricture   . Hyperlipidemia   . Hypertension    controlled  . Hypertensive heart disease   . Incomplete bladder emptying   . Iron deficiency anemia, unspecified   . Lower urinary tract symptoms   . Myocardial infarction ([place])    X7 LAST 2001  . Neuropathy   . Nocturia   . Obsessive compulsive disorder   . Other dysphagia   . Panic attack   . Parkinson's disease ([place])   . Sleep apnea    SURGICAL TX  . Stroke ([place])   . Type II diabetes mellitus ([place])   . Unspecified vitamin D deficiency    . Urethral stricture   . Urinary retention   . Varicose vein of leg   . Vertigo    hx of  . Vitamin D deficiency   . Wears dentures    upper and lowers    Patient Active Problem List   Diagnosis Date Noted  . Iron deficiency anemia due to chronic blood loss   . Benign neoplasm of ascending colon   . Benign neoplasm of cecum   . Gastritis without bleeding   . Parkinsonism due to drug ([place]) 07/22/2016  . Chronic bilateral low back pain without sciatica 04/07/2016  . Syncope 12/31/2015  . Status post coronary artery bypass grafting 12/31/2015  . S/P CABG x 4 12/25/2015  . Unstable angina ([place]) 12/01/2015  . Hypertensive heart disease   . Type II diabetes mellitus ([place])   . Abnormal chest CT 07/10/2015  . Pulmonary nodules 07/09/2015  . Basal cell carcinoma of skin 05/27/2015  . Bipolar disorder ([place]) 05/27/2015  . CAD (coronary artery disease), native coronary artery 05/27/2015  . DDD (degenerative disc disease), lumbosacral 05/27/2015  . Esophageal  reflux 05/27/2015  . Benign essential hypertension 05/27/2015  . Sleep apnea 05/27/2015  . Urethral stricture 05/27/2015  . Benign prostatic hypertrophy (BPH) with incomplete bladder emptying 11/29/2014  . CVA (cerebral infarction) 08/23/2014  . Hypertension   . S/P coronary artery stent placement 09/30/2011  . Hyperlipidemia 09/30/2011  . History of myocardial infarction 09/03/2011  . Seasonal allergies 09/03/2011    Past Surgical History:  Procedure Laterality Date  . BACK SURGERY     lumbar disc  . BACK SURGERY  1984  . CARDIAC CATHETERIZATION  6/10;2011;Aug.2012;Oct. 2012;   . CARDIAC CATHETERIZATION  05/2012   [facility]; EF 60%, patent stent in the left circumflex, moderate three-vessel disease with no flow-limiting lesions. Unchanged from most recent catheterization  . CARDIAC CATHETERIZATION N/A 12/10/2015   Procedure: Left Heart Cath and Coronary Angiography;  Surgeon: Corey Skains, MD;  Location: [facility];  Service: Cardiovascular;  Laterality: N/A;  . CARPAL TUNNEL RELEASE  2009   left  . CATARACT EXTRACTION W/PHACO Left 02/14/2017   Procedure: CATARACT EXTRACTION PHACO AND INTRAOCULAR LENS PLACEMENT (IOC);  Surgeon: Birder Robson, MD;  Location: [facility];  Service: Ophthalmology;  Laterality: Left;  [place] 00:42 AP% 16.6 CDE 7.14 Fluid pack lot # [id number]  . CATARACT EXTRACTION W/PHACO Right 02/28/2017   Procedure: CATARACT EXTRACTION PHACO AND INTRAOCULAR LENS PLACEMENT (IOC);  Surgeon: Birder Robson, MD;  Location: [facility];  Service: Ophthalmology;  Laterality: Right;  [place] 01:09 AP% 11.4 CDE 7.98 Fluid pack lot # [id number]  . COLONOSCOPY WITH PROPOFOL N/A 06/29/2017   Procedure: COLONOSCOPY WITH PROPOFOL;  Surgeon: [name], MD;  Location: [facility];  Service: Endoscopy;  Laterality: N/A;  Diabetic - oral meds  . CORONARY ANGIOPLASTY  2011 & 2001   s/p stent  . CORONARY ARTERY BYPASS GRAFT     stent placement  . CORONARY ARTERY BYPASS GRAFT N/A 12/25/2015   Procedure: CORONARY ARTERY BYPASS GRAFTING (CABG) x4  , using left internal mammary artery and right leg greater saphenous vein harvested endoscopically LIMA-LAD SVG-OM2 SVG-DIAG SVG-PD;  Surgeon: Melrose Nakayama, MD;  Location: [facility];  Service: Open Heart Surgery;  Laterality: N/A;  . [place]  . CYST REMOVAL LEG Right   . ESOPHAGOGASTRODUODENOSCOPY  06/29/2017   Procedure: ESOPHAGOGASTRODUODENOSCOPY (EGD);  Surgeon: [name], MD;  Location: [facility];  Service: Endoscopy;;  . POLYPECTOMY  06/29/2017   Procedure: POLYPECTOMY INTESTINAL;  Surgeon: [name], MD;  Location: [facility];  Service: Endoscopy;;  . SKIN GRAFT    . TEE WITHOUT CARDIOVERSION N/A 12/25/2015   Procedure: TRANSESOPHAGEAL ECHOCARDIOGRAM (TEE);  Surgeon: Melrose Nakayama, MD;  Location: [facility];  Service: Open Heart Surgery;  Laterality: N/A;  . TONSILLECTOMY    . UVULOPALATOPHARYNGOPLASTY          Home Medications    Prior to Admission medications   Medication Sig Start Date End Date Taking? Authorizing Provider  acetaminophen (TYLENOL) 500 MG tablet Take 1,000 mg by mouth every 6 (six) hours as needed for moderate pain or headache.    Yes [provider]  amLODipine (NORVASC) 10 MG tablet Take 10 mg by mouth daily. pm 10/06/16  Yes [provider]  ASPIRIN 81 PO Take by mouth daily.   Yes [provider]  atorvastatin (LIPITOR) 40 MG tablet Take 1 tablet (40 mg total) by mouth daily at 6 PM. 12/30/15  Yes Barrett, Erin R, PA-C  buPROPion (WELLBUTRIN XL) 300 MG 24 hr  tablet Take 300 mg by mouth daily. noon   Yes [provider]  carvedilol (COREG) 12.5 MG tablet Take 12.5 mg by mouth 2 (two) times daily. Am and pm 09/27/16  Yes [provider]  doxepin (SINEQUAN) 25 MG capsule Take 50 mg by mouth daily. pm 08/02/16  Yes [provider]  fluticasone (FLONASE) 50 MCG/ACT nasal spray Place 2 sprays into both nostrils daily. Am and pm   Yes [provider]  gabapentin (NEURONTIN) 300 MG capsule Take 300 mg by mouth 3 (three) times daily. Am, noon, pm 08/02/16  Yes [provider]  Incontinence Supply Disposable (DEPEND ADJUSTABLE UNDERWEAR LG) MISC Wear pads tid 09/19/16  Yes McGowan, Shannon A, PA-C  Ipratropium-Albuterol (COMBIVENT RESPIMAT) 20-100 MCG/ACT AERS respimat Inhale 2 puffs into the lungs every 6 (six) hours as needed for wheezing or shortness of breath.   Yes [provider]  JANUMET 50-1000 MG tablet Take 1 tablet by mouth 2 (two) times daily. Am and pm 08/02/16  Yes [provider]  lisinopril (PRINIVIL,ZESTRIL) 20 MG tablet Take 20 mg by mouth 2 (two) times daily. Am and pm 08/02/16  Yes [provider]  nitroGLYCERIN (NITROLINGUAL) 0.4 MG/SPRAY spray Place 1 spray under the tongue every 5 (five) minutes x 3 doses as needed for chest pain.   Yes [provider]  pantoprazole  (PROTONIX) 40 MG tablet Take 40 mg by mouth daily. am   Yes [provider]  risperidone (RISPERDAL) 4 MG tablet Take 4 mg by mouth at bedtime.  08/02/16  Yes [provider]  topiramate (TOPAMAX) 100 MG tablet Take 100 mg by mouth daily. noon   Yes [provider]  traMADol (ULTRAM) 50 MG tablet Take 1 tablet (50 mg total) by mouth every 8 (eight) hours as needed. 07/20/17  Yes Cook, Jayce G, DO  budesonide-formoterol (SYMBICORT) 160-4.5 MCG/ACT inhaler Inhale 2 puffs into the lungs 2 (two) times daily. 09/16/16   Flora Lipps, MD  budesonide-formoterol (SYMBICORT) 160-4.5 MCG/ACT inhaler Inhale 2 puffs into the lungs 2 (two) times daily. 03/29/18   Flora Lipps, MD  Ipratropium-Albuterol (COMBIVENT) 20-100 MCG/ACT AERS respimat Inhale 1 puff into the lungs every 6 (six) hours. 09/11/17   Flora Lipps, MD  LORazepam (ATIVAN) 1 MG tablet Take 1-2 tablets (1-2 mg total) by mouth 2 (two) times daily. Pt takes one tablet in the morning and two at bedtime. Patient taking differently: Take 1-2 mg by mouth See admin instructions. Take 1 mg by mouth in the morning and take 2 mg by mouth at bedtime 01/04/16   Gladstone Lighter, MD  sulfamethoxazole-trimethoprim (BACTRIM DS,SEPTRA DS) 800-160 MG tablet Take 1 tablet by mouth 2 (two) times daily. 07/07/18   Norval Gable, MD  tamsulosin (FLOMAX) 0.4 MG CAPS capsule TAKE 1 CAPSULE (0.4 MG TOTAL) BY MOUTH DAILY. 06/22/18   Hollice Espy, MD  tamsulosin (FLOMAX) 0.4 MG CAPS capsule Take 1 capsule (0.4 mg total) by mouth daily. 06/26/18   Nori Riis, PA-C    Family History Family History  Problem Relation Age of Onset  . Heart disease Mother   . Heart disease Brother   . Prostate cancer Father   . Heart disease Other   . Heart disease Brother   . Kidney disease Neg Hx   . Kidney cancer Neg Hx   . Bladder Cancer Neg Hx     Social History Social History   Tobacco Use  . Smoking status: Former Smoker    Packs/day:  0.50     Years: 15.00    Pack years: 7.50    Types: Cigarettes    Last attempt to quit: 05/18/1999    Years since quitting: 19.1  . Smokeless tobacco: Never Used  . Tobacco comment: quit 17 + years  Substance Use Topics  . Alcohol use: No    Alcohol/week: 0.0 standard drinks  . Drug use: No     Allergies   Morphine and related; Synalgos-dc [aspirin-caff-dihydrocodeine]; and Synchlor a [chlorpheniramine]   Review of Systems Review of Systems   Physical Exam Triage Vital Signs ED Triage Vitals  Enc Vitals Group     BP 07/07/18 1030 (!) 185/106     Pulse Rate 07/07/18 1030 64     Resp 07/07/18 1030 16     Temp 07/07/18 1030 97.7 F (36.5 C)     Temp Source 07/07/18 1030 Oral     SpO2 07/07/18 1030 98 %     Weight 07/07/18 1032 197 lb (89.4 kg)     Height 07/07/18 1032 6\' 3"  (1.905 m)     Head Circumference --      Peak Flow --      Pain Score 07/07/18 1032 6     Pain Loc --      Pain Edu? --      Excl. in [place]? --    No data found.  Updated Vital Signs BP (!) 185/106 (BP Location: Left Arm)   Pulse 64   Temp 97.7 F (36.5 C) (Oral)   Resp 16   Ht 6\' 3"  (1.905 m)   Wt 89.4 kg   SpO2 98%   BMI 24.62 kg/m   Visual Acuity Right Eye Distance:   Left Eye Distance:   Bilateral Distance:    Right Eye Near:   Left Eye Near:    Bilateral Near:     Physical Exam Vitals signs and nursing note reviewed.  Constitutional:      General: He is not in acute distress.    Appearance: He is not toxic-appearing or diaphoretic.  Cardiovascular:     Rate and Rhythm: Normal rate.  Pulmonary:     Effort: Pulmonary effort is normal. No respiratory distress.  Abdominal:     General: There is no distension.  Neurological:     Mental Status: He is alert.      UC Treatments / Results  Labs (all labs ordered are listed, but only abnormal results are displayed) Labs Reviewed  URINALYSIS, COMPLETE (UACMP) WITH MICROSCOPIC - Abnormal; Notable for the following components:       Result Value   Hgb urine dipstick TRACE (*)    Nitrite POSITIVE (*)    Leukocytes,Ua SMALL (*)    Bacteria, UA FEW (*)    All other components within normal limits  URINE CULTURE    EKG None  Radiology No results found.  Procedures Procedures (including critical care time)  Medications Ordered in UC Medications - No data to display  Initial Impression / Assessment and Plan / UC Course  I have reviewed the triage vital signs and the nursing notes.  Pertinent labs & imaging results that were available during my care of the patient were reviewed by me and considered in my medical decision making (see chart for details).      Final Clinical Impressions(s) / UC Diagnoses   Final diagnoses:  Urinary tract infection with hematuria, site unspecified  Seasonal allergies  Essential hypertension    ED Prescriptions  Medication Sig Dispense Auth. Provider   sulfamethoxazole-trimethoprim (BACTRIM DS,SEPTRA DS) 800-160 MG tablet Take 1 tablet by mouth 2 (two) times daily. 14 tablet Norval Gable, MD      1. Lab results and diagnosis reviewed with patient 2. rx as per orders above; reviewed possible side effects, interactions, risks and benefits  3. Recommend supportive treatment with increased water intake 4. Follow up with PCP for blood pressure management and other chronic health problems 5. Follow-up prn   Controlled Substance Prescriptions [place] Controlled Substance Registry consulted? Not Applicable   Norval Gable, MD 07/07/18 1416

## 2018-07-07 NOTE — ED Triage Notes (Signed)
Per patient burning with urination x 3 days. Patient also c/o cough / sneezing x 4 days.

## 2018-07-10 ENCOUNTER — Telehealth (HOSPITAL_COMMUNITY): Payer: Self-pay | Admitting: Emergency Medicine

## 2018-07-10 LAB — URINE CULTURE
Culture: >=100000 — AB
Special Requests: NORMAL

## 2018-07-10 MED ORDER — NITROFURANTOIN MONOHYD MACRO 100 MG PO CAPS: 100.0000 mg | ORAL_CAPSULE | Freq: Two times a day (BID) | ORAL | 0 refills | Status: DC

## 2018-07-10 NOTE — Telephone Encounter (Signed)
Urine culture was positive for STAPHYLOCOCCUS EPIDERMIDIS resistant to TRIMETH/SULFA given at urgent care visit. Prescription for Macrobid per sent to pharmacy of choice. Pt called and made aware. Pt educated to follow up if symptoms are not improving. Verbalized understanding.

## 2018-07-31 DIAGNOSIS — F329 Major depressive disorder, single episode, unspecified: Secondary | ICD-10-CM | POA: Diagnosis not present

## 2018-07-31 DIAGNOSIS — E559 Vitamin D deficiency, unspecified: Secondary | ICD-10-CM | POA: Diagnosis not present

## 2018-07-31 DIAGNOSIS — M129 Arthropathy, unspecified: Secondary | ICD-10-CM | POA: Diagnosis not present

## 2018-07-31 DIAGNOSIS — I10 Essential (primary) hypertension: Secondary | ICD-10-CM | POA: Diagnosis not present

## 2018-07-31 DIAGNOSIS — J449 Chronic obstructive pulmonary disease, unspecified: Secondary | ICD-10-CM | POA: Diagnosis not present

## 2018-07-31 DIAGNOSIS — N400 Enlarged prostate without lower urinary tract symptoms: Secondary | ICD-10-CM | POA: Diagnosis not present

## 2018-07-31 DIAGNOSIS — E785 Hyperlipidemia, unspecified: Secondary | ICD-10-CM | POA: Diagnosis not present

## 2018-07-31 DIAGNOSIS — R079 Chest pain, unspecified: Secondary | ICD-10-CM | POA: Diagnosis not present

## 2018-08-03 ENCOUNTER — Encounter: Payer: Self-pay | Admitting: Internal Medicine

## 2018-08-03 ENCOUNTER — Telehealth: Payer: Self-pay | Admitting: Internal Medicine

## 2018-08-03 ENCOUNTER — Ambulatory Visit (INDEPENDENT_AMBULATORY_CARE_PROVIDER_SITE_OTHER): Payer: Medicare Other | Admitting: Internal Medicine

## 2018-08-03 DIAGNOSIS — J441 Chronic obstructive pulmonary disease with (acute) exacerbation: Secondary | ICD-10-CM

## 2018-08-03 MED ORDER — PREDNISONE 20 MG PO TABS: 40.0000 mg | ORAL_TABLET | Freq: Every day | ORAL | 0 refills | Status: DC

## 2018-08-03 MED ORDER — AZITHROMYCIN 250 MG PO TABS: ORAL_TABLET | ORAL | 0 refills | Status: DC

## 2018-08-03 NOTE — Progress Notes (Signed)
[facility] Consultation      MRN# [id number] Roger Pruitt 1950/06/25  SYNOPSIS Has a history of coronary artery disease hypertensive heart disease syncope prior history of sleep apnea and gastritis. +COPD with chronic cough-Symbicort Combivent for his breathing.  He also is on Flonase for his allergies.  +history of sleep apnea and has a history of a UPPP.  He states that he has not had a follow-up as far as the surgery is concerned.  He also noted that his wife states that he still snores.  Apparently also had a history of 2 mini strokes in the past. Patient has pulmonary nodules which were followed from 2017 until the end of 2018.  The final recommendations from the radiology report was that no further follow-up is necessary because the nodules have been stable.    Pulmonary nodules have been stable since 2017  Patient is a deconditioned state Previous PFT shows scooping of the expiratory limb suggestive of obstruction        VIDEO/TELEPHONE VISIT    In the setting of the current Covid19 crisis, you are scheduled for a  visit with me on 08/03/2018  Just as we do with many in-office visits, in order for you to participate in this visit, we must obtain consent.   I can obtain your verbal consent now.  PATIENT AGREES AND CONFIRMS -YES This Visit has Audio and Visual Capabilities for optimal patient care experience   Evaluation Performed:  Follow-up visit  This visit type was conducted due to national recommendations for restrictions regarding the COVID-19 Pandemic (e.g. social distancing).  This format is felt to be most appropriate for this patient at this time.  All issues noted in this document were discussed and addressed.     Virtual Visit via Telephone Note     I connected with patient on 08/03/2018 by telephone and verified that I am speaking with the correct person using two identifiers.   I discussed the limitations, risks, security and privacy  concerns of performing an evaluation and management service by telephone and the availability of in person appointments. I also discussed with the patient that there may be a patient responsible charge related to this service. The patient expressed understanding and agreed to proceed.   Location of the patient: Home Location of provider: OFFICE Participating persons: Patient and provider only   CC: follow up COPD   HPI Patient has chronic cough  +chest congestion Very weak  Patient does not feel very well Also with headaches  This is signs of COPD exacerbation I have explained to him that if his symptoms get worse he needs to go to the ER and get tested for COVID   Patient with history of quadruple coronary artery bypass surgery    Review of Systems  Constitutional: Negative for chills and fever.  Eyes: Negative for blurred vision.  Respiratory: Positive for cough and shortness of breath. Negative for hemoptysis, sputum production and wheezing.   Cardiovascular: Positive for orthopnea. Negative for chest pain and palpitations.  Neurological: Positive for dizziness and headaches.  Endo/Heme/Allergies: Does not bruise/bleed easily.  Psychiatric/Behavioral: Negative for depression and substance abuse.      Allergies:  Morphine and related; Synalgos-dc [aspirin-caff-dihydrocodeine]; and Synchlor a [chlorpheniramine]   CT Chest 02/2018 I have Independently reviewed images of  CT chest   Interpretation: Stable small solid appearing pulmonary nodules. The largest measures 6 mm. Stable since 2017  Assessment and Plan:   Patient assessed for COPD  today Signs and symptoms of COPD exacerbation Patient given and prescribed prednisone therapy as well as antibiotics with Z-Pak Continue inhalers as prescribed  I have advised patient that if he feels any worse that he needs to go to the ER Patient will need to be tested for COVID if he ends up going to the ER     COVID-19  Education: The signs and symptoms of COVID-19 were discussed with the patient and how to seek care for testing (follow up with PCP or arrange E-visit).  The importance of social distancing was discussed today.   Patient  satisfied with Plan of action and management. All questions answered Follow up in 6 months   TOTAL TIME SPENT 23 minutes   Corrin Parker, M.D.  [facility]  Medical Director [place] Director [facility] Cardio-Pulmonary Department

## 2018-08-03 NOTE — Patient Instructions (Signed)
Start prednisone therapy Start antibiotic therapy with azithromycin Continue inhalers as prescribed  Patient advised to go to the ER if symptoms get worse Patient will need to be tested for COVID if he ends up in the ER

## 2018-08-03 NOTE — Addendum Note (Signed)
Addended by: Maryanna Shape A on: 08/03/2018 09:56 AM   Modules accepted: Orders

## 2018-08-03 NOTE — Telephone Encounter (Signed)
Called and spoke to pt. Pt requested that Rx for zpak and prednisone be sent to [facility] vs [facility].  Rx for zpak and prednisone has been sent to [facility]. Spoke to [place] with [facility] and requested that she cancel Rx for zpak and prednisone. Nothing further is needed.

## 2018-08-07 ENCOUNTER — Other Ambulatory Visit: Payer: Self-pay

## 2018-08-07 ENCOUNTER — Other Ambulatory Visit: Payer: Self-pay | Admitting: Internal Medicine

## 2018-08-16 DIAGNOSIS — J449 Chronic obstructive pulmonary disease, unspecified: Secondary | ICD-10-CM | POA: Diagnosis not present

## 2018-08-16 DIAGNOSIS — R32 Unspecified urinary incontinence: Secondary | ICD-10-CM | POA: Diagnosis not present

## 2018-08-16 DIAGNOSIS — F319 Bipolar disorder, unspecified: Secondary | ICD-10-CM | POA: Diagnosis not present

## 2018-08-22 ENCOUNTER — Other Ambulatory Visit: Payer: Self-pay | Admitting: Internal Medicine

## 2018-08-26 ENCOUNTER — Other Ambulatory Visit: Payer: Self-pay

## 2018-08-26 ENCOUNTER — Emergency Department
Admission: EM | Admit: 2018-08-26 | Discharge: 2018-08-26 | Disposition: A | Discharge status: Home / Self Care | Payer: Medicare Other

## 2018-08-26 NOTE — ED Triage Notes (Signed)
Pt via pov from home with sob, weakness, shakiness. Pt states he felt "a little weak" yesterday and last night and it was worse this morning. Pt came in c/o

## 2018-08-26 NOTE — ED Notes (Signed)
When [name], RN explained to patient that he would not be able to be seen in the hospital room with his wife, patient states he was going to go home, he does not need to be seen.  Patient is in no obvious distress at this time.  Left without triage completed.

## 2018-09-02 ENCOUNTER — Encounter: Payer: Self-pay | Admitting: Emergency Medicine

## 2018-09-02 ENCOUNTER — Ambulatory Visit
Admission: EM | Admit: 2018-09-02 | Discharge: 2018-09-02 | Disposition: A | Discharge status: Home / Self Care | Payer: Medicare Other | Attending: Family Medicine | Admitting: Family Medicine

## 2018-09-02 ENCOUNTER — Other Ambulatory Visit: Payer: Self-pay

## 2018-09-02 DIAGNOSIS — N3000 Acute cystitis without hematuria: Secondary | ICD-10-CM

## 2018-09-02 LAB — URINALYSIS, COMPLETE (UACMP) WITH MICROSCOPIC
Bilirubin Urine: NEGATIVE
Glucose, UA: NEGATIVE mg/dL
Ketones, ur: NEGATIVE mg/dL
Nitrite: POSITIVE — AB
Protein, ur: >300 mg/dL — AB
Specific Gravity, Urine: 1.02 (ref 1.005–1.030)
Squamous Epithelial / HPF: NONE SEEN (ref 0–5)
WBC, UA: >50 WBC/hpf (ref 0–5)
pH: >9 — ABNORMAL HIGH (ref 5.0–8.0)

## 2018-09-02 MED ORDER — NITROFURANTOIN MONOHYD MACRO 100 MG PO CAPS: 100.0000 mg | ORAL_CAPSULE | Freq: Two times a day (BID) | ORAL | 0 refills | Status: DC

## 2018-09-02 NOTE — ED Triage Notes (Signed)
Patient c/o dysuria and urinary frequency that started last night

## 2018-09-02 NOTE — ED Provider Notes (Signed)
[facility]    CSN: [id number] Arrival date & time: 09/02/18  1221  History   Chief Complaint Chief Complaint  Patient presents with   Dysuria   HPI  68 year old male with an extensive past medical history presents with urinary symptoms.  Patient reports that his symptoms started last night.  He reports dysuria and urinary frequency.  Has a history of UTI.  No fever.  He does report some associated abdominal pain.  No medications or interventions tried.  No known exacerbating or relieving factors.  His pain is currently 6/10 in severity.  No other complaints.   PMH, Surgical Hx, Family Hx, Social History reviewed and updated as below.  Past Medical History:  Diagnosis Date   Abdominal pain    Acute cystitis without hematuria    Anemia    Anginal pain ([place])    rarely   Arthritis    knees and hands   Asthma    Bipolar disorder ([place])    BIPOLAR/ SCHIZO/ AFFECTIVE    BPD (bronchopulmonary dysplasia)    BPH (benign prostatic hyperplasia)    CAD (coronary artery disease)    a. s/p MI 2001, 2012;  b. s/p prior LCX stenting; b. 05/2012 Abnl MV; c. 05/2012 Cath: patent LCX stent w/ otw mod non-obs dzs->Med Rx.   Carpal tunnel syndrome    surgery both hands   Depression    Disc degeneration, lumbar    Dyspnea    WITH EXERTION   Elevated PSA    Esophageal reflux    GERD (gastroesophageal reflux disease)    Hematuria    history of gross and microscopic   Hemorrhoid    History of balanitis    History of bladder infections    History of echocardiogram    a. 07/2014 Echo: EF 60-65%, no rwma, nl LA size, nl RV/PASP.   History of urethral stricture    Hyperlipidemia    Hypertension    controlled   Hypertensive heart disease    Incomplete bladder emptying    Iron deficiency anemia, unspecified    Lower urinary tract symptoms    Myocardial infarction ([place])    X7 LAST 2001   Neuropathy    Nocturia    Obsessive compulsive disorder      Other dysphagia    Panic attack    Parkinson's disease ([place])    Sleep apnea    SURGICAL TX   Stroke ([place])    Type II diabetes mellitus ([place])    Unspecified vitamin D deficiency    Urethral stricture    Urinary retention    Varicose vein of leg    Vertigo    hx of   Vitamin D deficiency    Wears dentures    upper and lowers    Patient Active Problem List   Diagnosis Date Noted   Iron deficiency anemia due to chronic blood loss    Benign neoplasm of ascending colon    Benign neoplasm of cecum    Gastritis without bleeding    Parkinsonism due to drug ([place]) 07/22/2016   Chronic bilateral low back pain without sciatica 04/07/2016   Syncope 12/31/2015   Status post coronary artery bypass grafting 12/31/2015   S/P CABG x 4 12/25/2015   Unstable angina ([place]) 12/01/2015   Hypertensive heart disease    Type II diabetes mellitus (H[facility])    Abnormal chest CT 07/10/2015   Pulmonary nodules 07/09/2015   Basal cell carcinoma of skin 05/27/2015   Bipolar disorder (  [place]) 05/27/2015   CAD (coronary artery disease), native coronary artery 05/27/2015   DDD (degenerative disc disease), lumbosacral 05/27/2015   Esophageal reflux 05/27/2015   Benign essential hypertension 05/27/2015   Sleep apnea 05/27/2015   Urethral stricture 05/27/2015   Benign prostatic hypertrophy (BPH) with incomplete bladder emptying 11/29/2014   CVA (cerebral infarction) 08/23/2014   Hypertension    S/P coronary artery stent placement 09/30/2011   Hyperlipidemia 09/30/2011   History of myocardial infarction 09/03/2011   Seasonal allergies 09/03/2011    Past Surgical History:  Procedure Laterality Date   BACK SURGERY     lumbar disc   BACK SURGERY  1984   CARDIAC CATHETERIZATION  6/10;2011;Aug.2012;Oct. 2012;    CARDIAC CATHETERIZATION  05/2012   [facility]; EF 60%, patent stent in the left circumflex, moderate three-vessel disease with no flow-limiting lesions.  Unchanged from most recent catheterization   CARDIAC CATHETERIZATION N/A 12/10/2015   Procedure: Left Heart Cath and Coronary Angiography;  Surgeon: Corey Skains, MD;  Location: [place] [facility];  Service: Cardiovascular;  Laterality: N/A;   CARPAL TUNNEL RELEASE  2009   left   CATARACT EXTRACTION W/PHACO Left 02/14/2017   Procedure: CATARACT EXTRACTION PHACO AND INTRAOCULAR LENS PLACEMENT ([place]);  Surgeon: Birder Robson, MD;  Location: [facility];  Service: Ophthalmology;  Laterality: Left;  [place] 00:42 AP% 16.6 CDE 7.14 Fluid pack lot # [id number]   CATARACT EXTRACTION W/PHACO Right 02/28/2017   Procedure: CATARACT EXTRACTION PHACO AND INTRAOCULAR LENS PLACEMENT (IOC);  Surgeon: Birder Robson, MD;  Location: [facility];  Service: Ophthalmology;  Laterality: Right;  [place] 01:09 AP% 11.4 CDE 7.98 Fluid pack lot # [id number]   COLONOSCOPY WITH PROPOFOL N/A 06/29/2017   Procedure: COLONOSCOPY WITH PROPOFOL;  Surgeon: [name], MD;  Location: [place];  Service: Endoscopy;  Laterality: N/A;  Diabetic - oral meds   CORONARY ANGIOPLASTY  2011 & 2001   s/p stent   CORONARY ARTERY BYPASS GRAFT     stent placement   CORONARY ARTERY BYPASS GRAFT N/A 12/25/2015   Procedure: CORONARY ARTERY BYPASS GRAFTING (CABG) x4  , using left internal mammary artery and right leg greater saphenous vein harvested endoscopically LIMA-LAD SVG-OM2 SVG-DIAG SVG-PD;  Surgeon: Melrose Nakayama, MD;  Location: [place];  Service: Open Heart Surgery;  Laterality: N/A;   COSMETIC SURGERY  1967   CYST REMOVAL LEG Right    ESOPHAGOGASTRODUODENOSCOPY  06/29/2017   Procedure: ESOPHAGOGASTRODUODENOSCOPY (EGD);  Surgeon: [name], MD;  Location: [place];  Service: Endoscopy;;   POLYPECTOMY  06/29/2017   Procedure: POLYPECTOMY INTESTINAL;  Surgeon: [name], MD;  Location: [place];  Service: Endoscopy;;   SKIN GRAFT     TEE WITHOUT CARDIOVERSION N/A 12/25/2015   Procedure:  TRANSESOPHAGEAL ECHOCARDIOGRAM (TEE);  Surgeon: Melrose Nakayama, MD;  Location: [place];  Service: Open Heart Surgery;  Laterality: N/A;   TONSILLECTOMY     UVULOPALATOPHARYNGOPLASTY         Home Medications    Prior to Admission medications   Medication Sig Start Date End Date Taking? Authorizing Provider  acetaminophen (TYLENOL) 500 MG tablet Take 1,000 mg by mouth every 6 (six) hours as needed for moderate pain or headache.    Yes [provider]  amLODipine (NORVASC) 10 MG tablet Take 10 mg by mouth daily. pm 10/06/16  Yes [provider]  ASPIRIN 81 PO Take by mouth daily.   Yes [provider]  atorvastatin (LIPITOR) 40 MG tablet Take 1 tablet (40  mg total) by mouth daily at 6 PM. 12/30/15  Yes Barrett, Erin R, PA-C  budesonide-formoterol (SYMBICORT) 160-4.5 MCG/ACT inhaler Inhale 2 puffs into the lungs 2 (two) times daily. 09/16/16  Yes Kasa, Maretta Bees, MD  buPROPion (WELLBUTRIN XL) 300 MG 24 hr tablet Take 300 mg by mouth daily. noon   Yes [provider]  carvedilol (COREG) 12.5 MG tablet Take 12.5 mg by mouth 2 (two) times daily. Am and pm 09/27/16  Yes [provider]  COMBIVENT RESPIMAT 20-100 MCG/ACT AERS respimat INHALE ONE PUFF INTO THE LUNGS EVERY 6 HOURS 08/22/18  Yes Kasa, Maretta Bees, MD  doxepin (SINEQUAN) 25 MG capsule Take 50 mg by mouth daily. pm 08/02/16  Yes [provider]  fluticasone (FLONASE) 50 MCG/ACT nasal spray Place 2 sprays into both nostrils daily. Am and pm   Yes [provider]  gabapentin (NEURONTIN) 300 MG capsule Take 300 mg by mouth 3 (three) times daily. Am, noon, pm 08/02/16  Yes [provider]  Incontinence Supply Disposable (DEPEND ADJUSTABLE UNDERWEAR LG) MISC Wear pads tid 09/19/16  Yes McGowan, Shannon A, PA-C  Ipratropium-Albuterol (COMBIVENT RESPIMAT) 20-100 MCG/ACT AERS respimat Inhale 2 puffs into the lungs every 6 (six) hours as needed for wheezing or shortness of breath.   Yes  [provider]  JANUMET 50-1000 MG tablet Take 1 tablet by mouth 2 (two) times daily. Am and pm 08/02/16  Yes [provider]  lisinopril (PRINIVIL,ZESTRIL) 20 MG tablet Take 20 mg by mouth 2 (two) times daily. Am and pm 08/02/16  Yes [provider]  LORazepam (ATIVAN) 1 MG tablet Take 1-2 tablets (1-2 mg total) by mouth 2 (two) times daily. Pt takes one tablet in the morning and two at bedtime. Patient taking differently: Take 1-2 mg by mouth See admin instructions. Take 1 mg by mouth in the morning and take 2 mg by mouth at bedtime 01/04/16  Yes Gladstone Lighter, MD  nitroGLYCERIN (NITROLINGUAL) 0.4 MG/SPRAY spray Place 1 spray under the tongue every 5 (five) minutes x 3 doses as needed for chest pain.   Yes [provider]  pantoprazole (PROTONIX) 40 MG tablet Take 40 mg by mouth daily. am   Yes [provider]  risperidone (RISPERDAL) 4 MG tablet Take 4 mg by mouth at bedtime.  08/02/16  Yes [provider]  SYMBICORT 160-4.5 MCG/ACT inhaler INHALE TWO PUFFS BY MOUTH INTO THE LUNGS TWICE A DAY 08/07/18  Yes Kasa, Maretta Bees, MD  tamsulosin (FLOMAX) 0.4 MG CAPS capsule TAKE 1 CAPSULE (0.4 MG TOTAL) BY MOUTH DAILY. 06/22/18  Yes Hollice Espy, MD  topiramate (TOPAMAX) 100 MG tablet Take 100 mg by mouth daily. noon   Yes [provider]  traMADol (ULTRAM) 50 MG tablet Take 1 tablet (50 mg total) by mouth every 8 (eight) hours as needed. 07/20/17  Yes Camara Rosander G, DO  nitrofurantoin, macrocrystal-monohydrate, (MACROBID) 100 MG capsule Take 1 capsule (100 mg total) by mouth 2 (two) times daily. 09/02/18   Coral Spikes, DO    Family History Family History  Problem Relation Age of Onset   Heart disease Mother    Heart disease Brother    Prostate cancer Father    Heart disease Other    Heart disease Brother    Kidney disease Neg Hx    Kidney cancer Neg Hx    Bladder Cancer Neg Hx     Social History Social History   Tobacco Use    Smoking status: Former Smoker  Packs/day: 0.50    Years: 15.00    Pack years: 7.50    Types: Cigarettes    Last attempt to quit: 05/18/1999    Years since quitting: 19.3   Smokeless tobacco: Never Used   Tobacco comment: quit 17 + years  Substance Use Topics   Alcohol use: No    Alcohol/week: 0.0 standard drinks   Drug use: No     Allergies   Morphine and related; Synalgos-dc [aspirin-caff-dihydrocodeine]; and Synchlor a [chlorpheniramine]   Review of Systems Review of Systems  Constitutional: Negative for fever.  Gastrointestinal: Positive for abdominal pain.  Genitourinary: Positive for dysuria and frequency.   Physical Exam Triage Vital Signs ED Triage Vitals  Enc Vitals Group     BP 09/02/18 1240 (!) 160/99     Pulse Rate 09/02/18 1240 88     Resp 09/02/18 1240 18     Temp 09/02/18 1240 97.8 F (36.6 C)     Temp Source 09/02/18 1240 Oral     SpO2 09/02/18 1240 99 %     Weight --      Height 09/02/18 1241 6\' 3"  (1.905 m)     Head Circumference --      Peak Flow --      Pain Score 09/02/18 1240 6     Pain Loc --      Pain Edu? --      Excl. in [place]? --    Updated Vital Signs BP (!) 160/99 (BP Location: Right Arm)    Pulse 88    Temp 97.8 F (36.6 C) (Oral)    Resp 18    Ht 6\' 3"  (1.905 m)    SpO2 99%    BMI 24.62 kg/m   Visual Acuity Right Eye Distance:   Left Eye Distance:   Bilateral Distance:    Right Eye Near:   Left Eye Near:    Bilateral Near:     Physical Exam Vitals signs and nursing note reviewed.  Constitutional:      General: He is not in acute distress.    Appearance: He is normal weight.  HENT:     Head: Normocephalic and atraumatic.  Eyes:     General:        Right eye: No discharge.        Left eye: No discharge.     Conjunctiva/sclera: Conjunctivae normal.  Cardiovascular:     Rate and Rhythm: Normal rate and regular rhythm.  Pulmonary:     Effort: Pulmonary effort is normal.     Breath sounds: Normal breath sounds.    Abdominal:     General: There is no distension.     Palpations: Abdomen is soft.     Comments: Mild tenderness in suprapubic region.  Neurological:     Mental Status: He is alert.  Psychiatric:     Comments: Flat affect.  Depressed mood.    UC Treatments / Results  Labs (all labs ordered are listed, but only abnormal results are displayed) Labs Reviewed  URINALYSIS, COMPLETE (UACMP) WITH MICROSCOPIC - Abnormal; Notable for the following components:      Result Value   APPearance CLOUDY (*)    pH >9.0 (*)    Hgb urine dipstick SMALL (*)    Protein, ur >300 (*)    Nitrite POSITIVE (*)    Leukocytes,Ua LARGE (*)    Bacteria, UA MANY (*)    All other components within normal limits  URINE CULTURE    EKG  None  Radiology No results found.  Procedures Procedures (including critical care time)  Medications Ordered in UC Medications - No data to display  Initial Impression / Assessment and Plan / UC Course  I have reviewed the triage vital signs and the nursing notes.  Pertinent labs & imaging results that were available during my care of the patient were reviewed by me and considered in my medical decision making (see chart for details).    68 year old male presents with UTI.  Placed on Macrobid given prior culture results.  Sending culture.  Final Clinical Impressions(s) / UC Diagnoses   Final diagnoses:  Acute cystitis without hematuria   Discharge Instructions   None    ED Prescriptions    Medication Sig Dispense Auth. Provider   nitrofurantoin, macrocrystal-monohydrate, (MACROBID) 100 MG capsule Take 1 capsule (100 mg total) by mouth 2 (two) times daily. 14 capsule Coral Spikes, DO     Controlled Substance Prescriptions [place] Controlled Substance Registry consulted? Not Applicable   Coral Spikes, DO 09/04/18 0830

## 2018-09-05 ENCOUNTER — Emergency Department: Payer: Medicare Other

## 2018-09-05 ENCOUNTER — Encounter: Payer: Self-pay | Admitting: Emergency Medicine

## 2018-09-05 ENCOUNTER — Telehealth (HOSPITAL_COMMUNITY): Payer: Self-pay | Admitting: Emergency Medicine

## 2018-09-05 ENCOUNTER — Other Ambulatory Visit: Payer: Self-pay

## 2018-09-05 ENCOUNTER — Observation Stay
Admission: EM | Admit: 2018-09-05 | Discharge: 2018-09-05 | Disposition: A | Discharge status: Home / Self Care | Payer: Medicare Other | Attending: Internal Medicine | Admitting: Internal Medicine

## 2018-09-05 DIAGNOSIS — F429 Obsessive-compulsive disorder, unspecified: Secondary | ICD-10-CM | POA: Diagnosis not present

## 2018-09-05 DIAGNOSIS — Z8673 Personal history of transient ischemic attack (TIA), and cerebral infarction without residual deficits: Secondary | ICD-10-CM | POA: Diagnosis not present

## 2018-09-05 DIAGNOSIS — R7989 Other specified abnormal findings of blood chemistry: Secondary | ICD-10-CM | POA: Insufficient documentation

## 2018-09-05 DIAGNOSIS — R778 Other specified abnormalities of plasma proteins: Secondary | ICD-10-CM

## 2018-09-05 DIAGNOSIS — F250 Schizoaffective disorder, bipolar type: Secondary | ICD-10-CM | POA: Insufficient documentation

## 2018-09-05 DIAGNOSIS — A419 Sepsis, unspecified organism: Secondary | ICD-10-CM | POA: Diagnosis not present

## 2018-09-05 DIAGNOSIS — R079 Chest pain, unspecified: Secondary | ICD-10-CM | POA: Diagnosis not present

## 2018-09-05 DIAGNOSIS — K219 Gastro-esophageal reflux disease without esophagitis: Secondary | ICD-10-CM | POA: Insufficient documentation

## 2018-09-05 DIAGNOSIS — R531 Weakness: Secondary | ICD-10-CM | POA: Diagnosis not present

## 2018-09-05 DIAGNOSIS — J45909 Unspecified asthma, uncomplicated: Secondary | ICD-10-CM | POA: Diagnosis not present

## 2018-09-05 DIAGNOSIS — R55 Syncope and collapse: Secondary | ICD-10-CM | POA: Diagnosis not present

## 2018-09-05 DIAGNOSIS — Z8249 Family history of ischemic heart disease and other diseases of the circulatory system: Secondary | ICD-10-CM | POA: Insufficient documentation

## 2018-09-05 DIAGNOSIS — Z7951 Long term (current) use of inhaled steroids: Secondary | ICD-10-CM | POA: Diagnosis not present

## 2018-09-05 DIAGNOSIS — E559 Vitamin D deficiency, unspecified: Secondary | ICD-10-CM | POA: Insufficient documentation

## 2018-09-05 DIAGNOSIS — I700 Atherosclerosis of aorta: Secondary | ICD-10-CM | POA: Insufficient documentation

## 2018-09-05 DIAGNOSIS — M1990 Unspecified osteoarthritis, unspecified site: Secondary | ICD-10-CM | POA: Insufficient documentation

## 2018-09-05 DIAGNOSIS — I252 Old myocardial infarction: Secondary | ICD-10-CM | POA: Diagnosis not present

## 2018-09-05 DIAGNOSIS — G20 Parkinson's disease: Secondary | ICD-10-CM | POA: Diagnosis not present

## 2018-09-05 DIAGNOSIS — N400 Enlarged prostate without lower urinary tract symptoms: Secondary | ICD-10-CM | POA: Insufficient documentation

## 2018-09-05 DIAGNOSIS — Z87891 Personal history of nicotine dependence: Secondary | ICD-10-CM | POA: Insufficient documentation

## 2018-09-05 DIAGNOSIS — Z1159 Encounter for screening for other viral diseases: Secondary | ICD-10-CM | POA: Insufficient documentation

## 2018-09-05 DIAGNOSIS — E119 Type 2 diabetes mellitus without complications: Secondary | ICD-10-CM | POA: Insufficient documentation

## 2018-09-05 DIAGNOSIS — I2510 Atherosclerotic heart disease of native coronary artery without angina pectoris: Secondary | ICD-10-CM | POA: Insufficient documentation

## 2018-09-05 DIAGNOSIS — Z955 Presence of coronary angioplasty implant and graft: Secondary | ICD-10-CM | POA: Diagnosis not present

## 2018-09-05 DIAGNOSIS — Z951 Presence of aortocoronary bypass graft: Secondary | ICD-10-CM | POA: Diagnosis not present

## 2018-09-05 DIAGNOSIS — G4730 Sleep apnea, unspecified: Secondary | ICD-10-CM | POA: Insufficient documentation

## 2018-09-05 DIAGNOSIS — N3000 Acute cystitis without hematuria: Secondary | ICD-10-CM | POA: Diagnosis not present

## 2018-09-05 DIAGNOSIS — Z79899 Other long term (current) drug therapy: Secondary | ICD-10-CM | POA: Diagnosis not present

## 2018-09-05 DIAGNOSIS — R918 Other nonspecific abnormal finding of lung field: Secondary | ICD-10-CM | POA: Diagnosis not present

## 2018-09-05 DIAGNOSIS — N390 Urinary tract infection, site not specified: Secondary | ICD-10-CM | POA: Insufficient documentation

## 2018-09-05 DIAGNOSIS — E785 Hyperlipidemia, unspecified: Secondary | ICD-10-CM | POA: Diagnosis not present

## 2018-09-05 DIAGNOSIS — I119 Hypertensive heart disease without heart failure: Secondary | ICD-10-CM | POA: Diagnosis not present

## 2018-09-05 DIAGNOSIS — R270 Ataxia, unspecified: Secondary | ICD-10-CM | POA: Diagnosis not present

## 2018-09-05 DIAGNOSIS — Z7984 Long term (current) use of oral hypoglycemic drugs: Secondary | ICD-10-CM | POA: Insufficient documentation

## 2018-09-05 DIAGNOSIS — Z7982 Long term (current) use of aspirin: Secondary | ICD-10-CM | POA: Insufficient documentation

## 2018-09-05 DIAGNOSIS — Z66 Do not resuscitate: Secondary | ICD-10-CM | POA: Insufficient documentation

## 2018-09-05 DIAGNOSIS — I447 Left bundle-branch block, unspecified: Secondary | ICD-10-CM | POA: Insufficient documentation

## 2018-09-05 LAB — URINALYSIS, COMPLETE (UACMP) WITH MICROSCOPIC
Bilirubin Urine: NEGATIVE
Glucose, UA: NEGATIVE mg/dL
Hgb urine dipstick: NEGATIVE
Ketones, ur: NEGATIVE mg/dL
Nitrite: POSITIVE — AB
Protein, ur: 100 mg/dL — AB
Specific Gravity, Urine: 1.015 (ref 1.005–1.030)
WBC, UA: >50 WBC/hpf — ABNORMAL HIGH (ref 0–5)
pH: 8 (ref 5.0–8.0)

## 2018-09-05 LAB — URINE CULTURE: Culture: >=100000 — AB

## 2018-09-05 LAB — BASIC METABOLIC PANEL
Anion gap: 7 (ref 5–15)
BUN: 33 mg/dL — ABNORMAL HIGH (ref 8–23)
CO2: 24 mmol/L (ref 22–32)
Calcium: 8.7 mg/dL — ABNORMAL LOW (ref 8.9–10.3)
Chloride: 108 mmol/L (ref 98–111)
Creatinine, Ser: 1.28 mg/dL — ABNORMAL HIGH (ref 0.61–1.24)
GFR calc Af Amer: >60 mL/min (ref >60)
GFR calc non Af Amer: 58 mL/min — ABNORMAL LOW (ref >60)
Glucose, Bld: 134 mg/dL — ABNORMAL HIGH (ref 70–99)
Potassium: 3.6 mmol/L (ref 3.5–5.1)
Sodium: 139 mmol/L (ref 135–145)

## 2018-09-05 LAB — LIPID PANEL
Cholesterol: 91 mg/dL (ref 0–200)
HDL: 27 mg/dL — ABNORMAL LOW (ref >40)
LDL Cholesterol: 52 mg/dL (ref 0–99)
Total CHOL/HDL Ratio: 3.4 RATIO
Triglycerides: 58 mg/dL (ref <150)
VLDL: 12 mg/dL (ref 0–40)

## 2018-09-05 LAB — CBC
HCT: 35.7 % — ABNORMAL LOW (ref 39.0–52.0)
Hemoglobin: 11.5 g/dL — ABNORMAL LOW (ref 13.0–17.0)
MCH: 29.2 pg (ref 26.0–34.0)
MCHC: 32.2 g/dL (ref 30.0–36.0)
MCV: 90.6 fL (ref 80.0–100.0)
Platelets: 175 10*3/uL (ref 150–400)
RBC: 3.94 MIL/uL — ABNORMAL LOW (ref 4.22–5.81)
RDW: 12.6 % (ref 11.5–15.5)
WBC: 6.4 10*3/uL (ref 4.0–10.5)
nRBC: 0 % (ref 0.0–0.2)

## 2018-09-05 LAB — TROPONIN I
Troponin I: 0.06 ng/mL (ref <0.03)
Troponin I: 0.08 ng/mL (ref <0.03)

## 2018-09-05 MED ORDER — ZOLPIDEM TARTRATE 5 MG PO TABS: 5.0000 mg | ORAL_TABLET | Freq: Every evening | ORAL | Status: DC | PRN

## 2018-09-05 MED ORDER — ENOXAPARIN SODIUM 40 MG/0.4ML ~~LOC~~ SOLN: 40.0000 mg | SUBCUTANEOUS | Status: DC

## 2018-09-05 MED ORDER — AMLODIPINE BESYLATE 5 MG PO TABS: 10.0000 mg | ORAL_TABLET | Freq: Every day | ORAL | Status: DC

## 2018-09-05 MED ORDER — SODIUM CHLORIDE 0.9 % IV SOLN
1.0000 g | Freq: Once | INTRAVENOUS | Status: AC
  Administered 2018-09-05: 1 g via INTRAVENOUS
  Filled 2018-09-05: qty 10

## 2018-09-05 MED ORDER — ASPIRIN EC 325 MG PO TBEC: 325.0000 mg | DELAYED_RELEASE_TABLET | Freq: Every day | ORAL | Status: DC

## 2018-09-05 MED ORDER — LIDOCAINE VISCOUS HCL 2 % MT SOLN: 15.0000 mL | Freq: Once | OROMUCOSAL | Status: DC

## 2018-09-05 MED ORDER — ALUM & MAG HYDROXIDE-SIMETH 200-200-20 MG/5ML PO SUSP: 30.0000 mL | Freq: Once | ORAL | Status: DC

## 2018-09-05 MED ORDER — NITROGLYCERIN 0.4 MG SL SUBL: 0.4000 mg | SUBLINGUAL_TABLET | SUBLINGUAL | Status: DC | PRN

## 2018-09-05 MED ORDER — BUPROPION HCL ER (XL) 150 MG PO TB24: 300.0000 mg | ORAL_TABLET | Freq: Every day | ORAL | Status: DC

## 2018-09-05 MED ORDER — CARVEDILOL 6.25 MG PO TABS: 12.5000 mg | ORAL_TABLET | Freq: Two times a day (BID) | ORAL | Status: DC

## 2018-09-05 MED ORDER — MORPHINE SULFATE (PF) 2 MG/ML IV SOLN: 2.0000 mg | INTRAVENOUS | Status: DC | PRN

## 2018-09-05 MED ORDER — ONDANSETRON HCL 4 MG/2ML IJ SOLN: 4.0000 mg | Freq: Four times a day (QID) | INTRAMUSCULAR | Status: DC | PRN

## 2018-09-05 MED ORDER — LISINOPRIL 10 MG PO TABS: 20.0000 mg | ORAL_TABLET | Freq: Two times a day (BID) | ORAL | Status: DC

## 2018-09-05 MED ORDER — DOXEPIN HCL 50 MG PO CAPS: 50.0000 mg | ORAL_CAPSULE | Freq: Every day | ORAL | Status: DC

## 2018-09-05 MED ORDER — NITROGLYCERIN 0.4 MG/SPRAY TL SOLN: 1.0000 | Status: DC | PRN

## 2018-09-05 MED ORDER — SODIUM CHLORIDE 0.9 % IV SOLN: 1.0000 g | INTRAVENOUS | Status: DC

## 2018-09-05 MED ORDER — LORAZEPAM 1 MG PO TABS: 1.0000 mg | ORAL_TABLET | Freq: Two times a day (BID) | ORAL | Status: DC

## 2018-09-05 MED ORDER — ACETAMINOPHEN 325 MG PO TABS: 650.0000 mg | ORAL_TABLET | ORAL | Status: DC | PRN

## 2018-09-05 MED ORDER — ATORVASTATIN CALCIUM 20 MG PO TABS: 40.0000 mg | ORAL_TABLET | Freq: Every day | ORAL | Status: DC

## 2018-09-05 MED ORDER — SODIUM CHLORIDE 0.9 % IV SOLN: INTRAVENOUS | Status: DC

## 2018-09-05 MED ORDER — CEPHALEXIN 500 MG PO CAPS: 500.0000 mg | ORAL_CAPSULE | Freq: Two times a day (BID) | ORAL | 0 refills | Status: AC

## 2018-09-05 NOTE — TOC Initial Note (Signed)
Transition of Care [facility]) - Initial/Assessment Note    Patient Details  Name: Roger Pruitt MRN: [id number] Date of Birth: Dec 17, 1950  [facility] CM/SW Contact:    Shelbie Hutching, RN Phone Number: 09/05/2018, 10:13 AM  Clinical Narrative:                 Patient is being placed in observation for dizziness, near syncope, elevated troponin and UTI.  Patient reports that he lives with his wife when she is not in rehab.  He reports that his wife is currently at [employer].  Patient reports he is independent and drives.  Patient reports that he thinks he should just go home, he does not want to stay.  MD and bedside RN notified of patient wanting to leave AMA.  RN will speak with patient and see if he will stay.  No discharge needs identified at this time.   Expected Discharge Plan: Home/Self Care Barriers to Discharge: Continued Medical Work up   Patient Goals and CMS Choice Patient states their goals for this hospitalization and ongoing recovery are:: Patient wants to go home      Expected Discharge Plan and Services Expected Discharge Plan: Home/Self Care       Living arrangements for the past 2 months: Mobile Home                                      Prior Living Arrangements/Services Living arrangements for the past 2 months: Mobile Home   Patient language and need for interpreter reviewed:: No Do you feel safe going back to the place where you live?: Yes      Need for Family Participation in Patient Care: Yes (Comment) Care giver support system in place?: Yes (comment)(wife)   Criminal Activity/Legal Involvement Pertinent to Current Situation/Hospitalization: No - Comment as needed  Activities of Daily Living      Permission Sought/Granted                  Emotional Assessment Appearance:: Appears stated age Attitude/Demeanor/Rapport: Engaged Affect (typically observed): Accepting Orientation: : Oriented to Self, Oriented to  Place, Oriented to  Time, Oriented to Situation Alcohol / Substance Use: Not Applicable Psych Involvement: No (comment)  Admission diagnosis:  [facility] - Dizziness Patient Active Problem List   Diagnosis Date Noted  . Chest pain 09/05/2018  . Iron deficiency anemia due to chronic blood loss   . Benign neoplasm of ascending colon   . Benign neoplasm of cecum   . Gastritis without bleeding   . Parkinsonism due to drug ([place]) 07/22/2016  . Chronic bilateral low back pain without sciatica 04/07/2016  . Syncope 12/31/2015  . Status post coronary artery bypass grafting 12/31/2015  . S/P CABG x 4 12/25/2015  . Unstable angina ([place]) 12/01/2015  . Hypertensive heart disease   . Type II diabetes mellitus ([place])   . Abnormal chest CT 07/10/2015  . Pulmonary nodules 07/09/2015  . Basal cell carcinoma of skin 05/27/2015  . Bipolar disorder ([place]) 05/27/2015  . CAD (coronary artery disease), native coronary artery 05/27/2015  . DDD (degenerative disc disease), lumbosacral 05/27/2015  . Esophageal reflux 05/27/2015  . Benign essential hypertension 05/27/2015  . Sleep apnea 05/27/2015  . Urethral stricture 05/27/2015  . Benign prostatic hypertrophy (BPH) with incomplete bladder emptying 11/29/2014  . CVA (cerebral infarction) 08/23/2014  . Hypertension   . S/P  coronary artery stent placement 09/30/2011  . Hyperlipidemia 09/30/2011  . History of myocardial infarction 09/03/2011  . Seasonal allergies 09/03/2011   PCP:  Casilda Carls, MD Pharmacy:   [place] [address] [place] [address] Phone: [phone] Fax: [phone]     Social Determinants of Health (SDOH) Interventions    Readmission Risk Interventions No flowsheet data found.

## 2018-09-05 NOTE — ED Notes (Signed)
Pt returned to ED for IV removal. First Nurse Opal Sidles removed at this time.

## 2018-09-05 NOTE — ED Notes (Addendum)
Pt left AMA prior to discharge orders being placed. RN unable to reassess vitals and pain level. Pt left with IV in place, RN informed patient and his caretaker of need to return to ED for removal. No signature obtained for discharge.

## 2018-09-05 NOTE — Care Management Obs Status (Signed)
[place] NOTIFICATION   Patient Details  Name: Roger Pruitt MRN: [id number] Date of Birth: [birth year]   Medicare Observation Status Notification Given:  Yes    Shelbie Hutching, RN 09/05/2018, 10:11 AM

## 2018-09-05 NOTE — ED Notes (Signed)
Pt gave this RN permission to speak with Shellia Carwin, Caretaker (ph [phone]). Pt given phone to speak with caretaker. Caretaker will provide ride for patient upon discharge.

## 2018-09-05 NOTE — ED Notes (Signed)
Patient transported to CT 

## 2018-09-05 NOTE — ED Triage Notes (Signed)
Patient to ER for c/o dizziness since Sunday. States symptoms worsened tonight. Also reports feeling near syncopal tonight.  Denies any nausea or fevers. Was recently diagnosed with UTI (a couple of days ago).

## 2018-09-05 NOTE — Progress Notes (Signed)
Advance care planning  Purpose of Encounter CAD, elevated troponin, UTI  Parties in Attendance Patient  Patients Decisional capacity Alert and oriented.  He tells me his wife would make decisions for him if he is unable to.  Wife presently admitted to rehab.  Discussed in detail regarding CAD, elevated troponin.  Treatment plan , prognosis discussed.  All questions answered  CODE STATUS discussed.  Patient wishes to be DNR/DNI.  Orders entered and CODE STATUS changed  DNR/DNI  Time spent - 17 minutes

## 2018-09-05 NOTE — H&P (Signed)
[facility] NAME: Roger Pruitt    MR#:  [id number]  DATE OF BIRTH:  1951-01-02  DATE OF ADMISSION:  09/05/2018  PRIMARY CARE PHYSICIAN: Casilda Carls, MD   REQUESTING/REFERRING PHYSICIAN: Dr. Owens Shark  CHIEF COMPLAINT:   Chief Complaint  Patient presents with  . Dizziness    HISTORY OF PRESENT ILLNESS:  Roger Pruitt  is a 68 y.o. male with a known history of hypertension, CAD, stroke, diabetes mellitus, recent UTI being treated with nitrofurantoin presented to the emergency room complaining of dizziness, feeling weak.  Patient also complained of chest pain in the emergency room.  On further questioning patient tells me he has chronic on and off chest pain that has been worked up extensively by his cardiologist Dr. Nehemiah Massed.  Being managed medically.  Here patient's initial troponin was 0.06.  Also he has chronic dizziness which is stable.  He does feel weak with his UTI.  Has 5 more days of nitrofurantoin.  Afebrile in the emergency room.  PAST MEDICAL HISTORY:   Past Medical History:  Diagnosis Date  . Abdominal pain   . Acute cystitis without hematuria   . Anemia   . Anginal pain ([place])    rarely  . Arthritis    knees and hands  . Asthma   . Bipolar disorder ([place])    BIPOLAR/ SCHIZO/ AFFECTIVE   . BPD (bronchopulmonary dysplasia)   . BPH (benign prostatic hyperplasia)   . CAD (coronary artery disease)    a. s/p MI 2001, 2012;  b. s/p prior LCX stenting; b. 05/2012 Abnl MV; c. 05/2012 Cath: patent LCX stent w/ otw mod non-obs dzs->Med Rx.  . Carpal tunnel syndrome    surgery both hands  . Depression   . Disc degeneration, lumbar   . Dyspnea    WITH EXERTION  . Elevated PSA   . Esophageal reflux   . GERD (gastroesophageal reflux disease)   . Hematuria    history of gross and microscopic  . Hemorrhoid   . History of balanitis   . History of bladder infections   . History of echocardiogram    a. 07/2014 Echo: EF 60-65%, no  rwma, nl LA size, nl RV/PASP.  Marland Kitchen History of urethral stricture   . Hyperlipidemia   . Hypertension    controlled  . Hypertensive heart disease   . Incomplete bladder emptying   . Iron deficiency anemia, unspecified   . Lower urinary tract symptoms   . Myocardial infarction ([place])    X7 LAST 2001  . Neuropathy   . Nocturia   . Obsessive compulsive disorder   . Other dysphagia   . Panic attack   . Parkinson's disease ([place])   . Sleep apnea    SURGICAL TX  . Stroke ([place])   . Type II diabetes mellitus ([place])   . Unspecified vitamin D deficiency   . Urethral stricture   . Urinary retention   . Varicose vein of leg   . Vertigo    hx of  . Vitamin D deficiency   . Wears dentures    upper and lowers    PAST SURGICAL HISTORY:   Past Surgical History:  Procedure Laterality Date  . BACK SURGERY     lumbar disc  . BACK SURGERY  1984  . CARDIAC CATHETERIZATION  6/10;2011;Aug.2012;Oct. 2012;   . CARDIAC CATHETERIZATION  05/2012   [facility]; EF 60%, patent stent in the left circumflex, moderate three-vessel disease with  no flow-limiting lesions. Unchanged from most recent catheterization  . CARDIAC CATHETERIZATION N/A 12/10/2015   Procedure: Left Heart Cath and Coronary Angiography;  Surgeon: Corey Skains, MD;  Location: [place] [facility];  Service: Cardiovascular;  Laterality: N/A;  . CARPAL TUNNEL RELEASE  2009   left  . CATARACT EXTRACTION W/PHACO Left 02/14/2017   Procedure: CATARACT EXTRACTION PHACO AND INTRAOCULAR LENS PLACEMENT (IOC);  Surgeon: Birder Robson, MD;  Location: [facility];  Service: Ophthalmology;  Laterality: Left;  [place] 00:42 AP% 16.6 CDE 7.14 Fluid pack lot # [id number]  . CATARACT EXTRACTION W/PHACO Right 02/28/2017   Procedure: CATARACT EXTRACTION PHACO AND INTRAOCULAR LENS PLACEMENT (IOC);  Surgeon: Birder Robson, MD;  Location: [facility];  Service: Ophthalmology;  Laterality: Right;  [place] 01:09 AP% 11.4 CDE 7.98 Fluid pack lot # [id number]  . COLONOSCOPY  WITH PROPOFOL N/A 06/29/2017   Procedure: COLONOSCOPY WITH PROPOFOL;  Surgeon: [name], MD;  Location: [place];  Service: Endoscopy;  Laterality: N/A;  Diabetic - oral meds  . CORONARY ANGIOPLASTY  2011 & 2001   s/p stent  . CORONARY ARTERY BYPASS GRAFT     stent placement  . CORONARY ARTERY BYPASS GRAFT N/A 12/25/2015   Procedure: CORONARY ARTERY BYPASS GRAFTING (CABG) x4  , using left internal mammary artery and right leg greater saphenous vein harvested endoscopically LIMA-LAD SVG-OM2 SVG-DIAG SVG-PD;  Surgeon: Melrose Nakayama, MD;  Location: [place];  Service: Open Heart Surgery;  Laterality: N/A;  . [place]  . CYST REMOVAL LEG Right   . ESOPHAGOGASTRODUODENOSCOPY  06/29/2017   Procedure: ESOPHAGOGASTRODUODENOSCOPY (EGD);  Surgeon: [name], MD;  Location: [place];  Service: Endoscopy;;  . POLYPECTOMY  06/29/2017   Procedure: POLYPECTOMY INTESTINAL;  Surgeon: [name], MD;  Location: [place];  Service: Endoscopy;;  . SKIN GRAFT    . TEE WITHOUT CARDIOVERSION N/A 12/25/2015   Procedure: TRANSESOPHAGEAL ECHOCARDIOGRAM (TEE);  Surgeon: Melrose Nakayama, MD;  Location: [place] [facility];  Service: Open Heart Surgery;  Laterality: N/A;  . TONSILLECTOMY    . UVULOPALATOPHARYNGOPLASTY      SOCIAL HISTORY:   Social History   Tobacco Use  . Smoking status: Former Smoker    Packs/day: 0.50    Years: 15.00    Pack years: 7.50    Types: Cigarettes    Last attempt to quit: 05/18/1999    Years since quitting: 19.3  . Smokeless tobacco: Never Used  . Tobacco comment: quit 17 + years  Substance Use Topics  . Alcohol use: No    Alcohol/week: 0.0 standard drinks    FAMILY HISTORY:   Family History  Problem Relation Age of Onset  . Heart disease Mother   . Heart disease Brother   . Prostate cancer Father   . Heart disease Other   . Heart disease Brother   . Kidney disease Neg Hx   . Kidney cancer Neg Hx   . Bladder Cancer Neg Hx      DRUG ALLERGIES:   Allergies  Allergen Reactions  . Morphine And Related Nausea And Vomiting  . Synalgos-Dc [Aspirin-Caff-Dihydrocodeine] Itching and Other (See Comments)    Reaction:  Stinging   . Synchlor A [Chlorpheniramine] Rash and Other (See Comments)    Patient doesn't recall this allergy.    REVIEW OF SYSTEMS:   Review of Systems  Constitutional: Positive for malaise/fatigue. Negative for chills and fever.  HENT: Negative for sore throat.   Eyes: Negative for blurred vision, double  vision and pain.  Respiratory: Negative for cough, hemoptysis, shortness of breath and wheezing.   Cardiovascular: Positive for chest pain. Negative for palpitations, orthopnea and leg swelling.  Gastrointestinal: Negative for abdominal pain, constipation, diarrhea, heartburn, nausea and vomiting.  Genitourinary: Negative for dysuria and hematuria.  Musculoskeletal: Negative for back pain and joint pain.  Skin: Negative for rash.  Neurological: Positive for dizziness. Negative for sensory change, speech change, focal weakness and headaches.  Endo/Heme/Allergies: Does not bruise/bleed easily.  Psychiatric/Behavioral: Negative for depression. The patient is not nervous/anxious.     MEDICATIONS AT HOME:   Prior to Admission medications   Medication Sig Start Date End Date Taking? Authorizing Provider  acetaminophen (TYLENOL) 500 MG tablet Take 1,000 mg by mouth every 6 (six) hours as needed for moderate pain or headache.    Yes [provider]  amLODipine (NORVASC) 10 MG tablet Take 10 mg by mouth daily. pm 10/06/16  Yes [provider]  aspirin (ASPIRIN 81) 81 MG EC tablet Take 81 mg by mouth daily.    Yes [provider]  atorvastatin (LIPITOR) 40 MG tablet Take 1 tablet (40 mg total) by mouth daily at 6 PM. 12/30/15  Yes Barrett, Erin R, PA-C  budesonide-formoterol (SYMBICORT) 160-4.5 MCG/ACT inhaler Inhale 2 puffs into the lungs 2 (two) times daily. 09/16/16  Yes  Kasa, Maretta Bees, MD  buPROPion (WELLBUTRIN XL) 300 MG 24 hr tablet Take 300 mg by mouth daily. noon   Yes [provider]  carvedilol (COREG) 12.5 MG tablet Take 12.5 mg by mouth 2 (two) times daily. Am and pm 09/27/16  Yes [provider]  COMBIVENT RESPIMAT 20-100 MCG/ACT AERS respimat INHALE ONE PUFF INTO THE LUNGS EVERY 6 HOURS 08/22/18  Yes Kasa, Maretta Bees, MD  doxepin (SINEQUAN) 25 MG capsule Take 50 mg by mouth daily. pm 08/02/16  Yes [provider]  fluticasone (FLONASE) 50 MCG/ACT nasal spray Place 2 sprays into both nostrils daily. Am and pm   Yes [provider]  gabapentin (NEURONTIN) 300 MG capsule Take 300 mg by mouth 3 (three) times daily. Am, noon, pm 08/02/16  Yes [provider]  isosorbide mononitrate (IMDUR) 30 MG 24 hr tablet Take 30 mg by mouth daily. 08/13/18  Yes [provider]  JANUMET 50-1000 MG tablet Take 1 tablet by mouth 2 (two) times daily. Am and pm 08/02/16  Yes [provider]  lisinopril (PRINIVIL,ZESTRIL) 20 MG tablet Take 20 mg by mouth 2 (two) times daily. Am and pm 08/02/16  Yes [provider]  LORazepam (ATIVAN) 1 MG tablet Take 1-2 tablets (1-2 mg total) by mouth 2 (two) times daily. Pt takes one tablet in the morning and two at bedtime. Patient taking differently: Take 1-2 mg by mouth See admin instructions. Take 1 mg by mouth in the morning and take 2 mg by mouth at bedtime 01/04/16  Yes Gladstone Lighter, MD  nitroGLYCERIN (NITROLINGUAL) 0.4 MG/SPRAY spray Place 1 spray under the tongue every 5 (five) minutes x 3 doses as needed for chest pain.   Yes [provider]  pantoprazole (PROTONIX) 40 MG tablet Take 40 mg by mouth daily. am   Yes [provider]  risperidone (RISPERDAL) 4 MG tablet Take 4 mg by mouth at bedtime.  08/02/16  Yes [provider]  tamsulosin (FLOMAX) 0.4 MG CAPS capsule TAKE 1 CAPSULE (0.4 MG TOTAL) BY MOUTH DAILY. 06/22/18  Yes Hollice Espy, MD   topiramate (TOPAMAX) 100 MG tablet Take 100 mg by mouth  daily. noon   Yes [provider]  traMADol (ULTRAM) 50 MG tablet Take 1 tablet (50 mg total) by mouth every 8 (eight) hours as needed. 07/20/17  Yes Cook, Jayce G, DO     VITAL SIGNS:  Blood pressure 140/75, pulse 63, temperature 97.7 F (36.5 C), temperature source Oral, resp. rate 16, height 6\' 3"  (1.905 m), weight 89.4 kg, SpO2 98 %.  PHYSICAL EXAMINATION:  Physical Exam  GENERAL:  68 y.o.-year-old patient lying in the bed with no acute distress.  EYES: Pupils equal, round, reactive to light and accommodation. No scleral icterus. Extraocular muscles intact.  HEENT: Head atraumatic, normocephalic. Oropharynx and nasopharynx clear. No oropharyngeal erythema, moist oral mucosa  NECK:  Supple, no jugular venous distention. No thyroid enlargement, no tenderness.  LUNGS: Normal breath sounds bilaterally, no wheezing, rales, rhonchi. No use of accessory muscles of respiration.  CARDIOVASCULAR: S1, S2 normal. No murmurs, rubs, or gallops.  ABDOMEN: Soft, nontender, nondistended. Bowel sounds present. No organomegaly or mass.  EXTREMITIES: No pedal edema, cyanosis, or clubbing. + 2 pedal & radial pulses b/l.   NEUROLOGIC: Cranial nerves II through XII are intact. No focal Motor or sensory deficits appreciated b/l PSYCHIATRIC: The patient is alert and oriented x 3. Good affect.  SKIN: No obvious rash, lesion, or ulcer.   LABORATORY PANEL:   CBC Recent Labs  Lab 09/05/18 0438  WBC 6.4  HGB 11.5*  HCT 35.7*  PLT 175   ------------------------------------------------------------------------------------------------------------------  Chemistries  Recent Labs  Lab 09/05/18 0438  NA 139  K 3.6  CL 108  CO2 24  GLUCOSE 134*  BUN 33*  CREATININE 1.28*  CALCIUM 8.7*   ------------------------------------------------------------------------------------------------------------------  Cardiac Enzymes Recent Labs  Lab  09/05/18 0856  TROPONINI 0.08*   ------------------------------------------------------------------------------------------------------------------  RADIOLOGY:  Ct Head Wo Contrast  Result Date: 09/05/2018 CLINICAL DATA:  Ataxia, stroke suspected. Dizziness for 3 days. Symptoms became worse tonight. Recent urinary tract infection. EXAM: CT HEAD WITHOUT CONTRAST TECHNIQUE: Contiguous axial images were obtained from the base of the skull through the vertex without intravenous contrast. COMPARISON:  CT head without contrast 10/18/2016 FINDINGS: Brain: Remote lacunar infarcts of the basal ganglia are stable. Mild generalized atrophy and white matter disease is unchanged. Basal ganglia are otherwise intact. Insular ribbon is normal bilaterally. No acute or focal cortical abnormalities are evident. The ventricles are proportionate to the degree of atrophy. No significant extraaxial fluid collection is present. The brainstem and cerebellum are within normal limits. Vascular: Atherosclerotic changes are present within the cavernous internal carotid arteries bilaterally. There is no hyperdense vessel. Skull: Calvarium is intact. No focal lytic or blastic lesions are present. No significant extracranial soft tissue lesions are present. Sinuses/Orbits: The paranasal sinuses and mastoid air cells are clear. Bilateral lens replacements are noted. Globes and orbits are otherwise unremarkable. IMPRESSION: 1. No acute intracranial abnormality or significant interval change to explain the patient's dizziness or near syncope. 2. Stable atrophy and white matter disease. 3. Remote lacunar infarcts of the basal ganglia bilaterally are stable. Electronically Signed   By: San Morelle M.D.   On: 09/05/2018 05:38   Dg Chest Port 1 View  Result Date: 09/05/2018 CLINICAL DATA:  Dizziness. EXAM: PORTABLE CHEST 1 VIEW COMPARISON:  One-view chest x-ray 10/18/2016 FINDINGS: The heart size is normal. Patient is status post  median sternotomy for CABG. Atherosclerotic changes are noted at the aortic arch. There is no edema or effusion to suggest failure. Lung volumes are low. Mild bibasilar airspace opacities are  present, left greater than right. The visualized soft tissues and bony thorax are unremarkable. IMPRESSION: 1. Mild bibasilar airspace opacities in the setting of low lung volumes likely reflect atelectasis. Infection is not excluded. 2. Aortic atherosclerosis. Electronically Signed   By: San Morelle M.D.   On: 09/05/2018 05:40     IMPRESSION AND PLAN:   *Chest pain with elevated troponin.  Symptoms seem chronic but with elevated troponin will admit under observation.  Consult cardiology.  Monitor on telemetry unit.  Repeat troponin.  Aspirin.  *UTI.  Patient is on nitrofurantoin and has 5 more days of it.  Afebrile and normal WBC today.  *Chronic mild dizziness is unchanged.  No syncopal episodes.  *Diabetes mellitus.  Sliding scale insulin  *Hypertension.  Continue home medications  DVT prophylaxis with Lovenox  All the records are reviewed and case discussed with ED provider. Management plans discussed with the patient, family and they are in agreement.  CODE STATUS: DNR/DNI  TOTAL TIME TAKING CARE OF THIS PATIENT: 40 minutes.   Leia Alf Chinedum Vanhouten M.D on 09/05/2018 at 10:57 AM  Between 7am to 6pm - Pager - [phone]  After 6pm go to [email or web address] - password [email or web address] [facility]  Office  [phone]  CC: Primary care physician; Casilda Carls, MD  Note: This dictation was prepared with Dragon dictation along with smaller phrase technology. Any transcriptional errors that result from this process are unintentional.

## 2018-09-05 NOTE — ED Provider Notes (Signed)
[facility] Emergency Department Provider Note  ____________________________________________   First MD Initiated Contact with Patient 09/05/18 [phone]     (approximate)  I have reviewed the triage vital signs and the nursing notes.   HISTORY  Chief Complaint Dizziness   HPI Roger Pruitt is a 68 y.o. male with below list of previous medical conditions including UTI presents to the emergency department via EMS with persistent dizziness since Sunday.  Patient states that symptoms worsened tonight prompting his call to EMS.  Patient denies any nausea vomiting diarrhea constipation.  Patient denies any abdominal pain.  Patient denies any dyspnea.  Patient does admit to "some chest pain".  Patient states that current pain score is 4 out of 10.  Patient denies any diaphoresis.        Past Medical History:  Diagnosis Date  . Abdominal pain   . Acute cystitis without hematuria   . Anemia   . Anginal pain ([place])    rarely  . Arthritis    knees and hands  . Asthma   . Bipolar disorder ([place])    BIPOLAR/ SCHIZO/ AFFECTIVE   . BPD (bronchopulmonary dysplasia)   . BPH (benign prostatic hyperplasia)   . CAD (coronary artery disease)    a. s/p MI 2001, 2012;  b. s/p prior LCX stenting; b. 05/2012 Abnl MV; c. 05/2012 Cath: patent LCX stent w/ otw mod non-obs dzs->Med Rx.  . Carpal tunnel syndrome    surgery both hands  . Depression   . Disc degeneration, lumbar   . Dyspnea    WITH EXERTION  . Elevated PSA   . Esophageal reflux   . GERD (gastroesophageal reflux disease)   . Hematuria    history of gross and microscopic  . Hemorrhoid   . History of balanitis   . History of bladder infections   . History of echocardiogram    a. 07/2014 Echo: EF 60-65%, no rwma, nl LA size, nl RV/PASP.  Marland Kitchen History of urethral stricture   . Hyperlipidemia   . Hypertension    controlled  . Hypertensive heart disease   . Incomplete bladder emptying   . Iron deficiency anemia,  unspecified   . Lower urinary tract symptoms   . Myocardial infarction ([place])    X7 LAST 2001  . Neuropathy   . Nocturia   . Obsessive compulsive disorder   . Other dysphagia   . Panic attack   . Parkinson's disease ([place])   . Sleep apnea    SURGICAL TX  . Stroke ([place])   . Type II diabetes mellitus ([place])   . Unspecified vitamin D deficiency   . Urethral stricture   . Urinary retention   . Varicose vein of leg   . Vertigo    hx of  . Vitamin D deficiency   . Wears dentures    upper and lowers    Patient Active Problem List   Diagnosis Date Noted  . Iron deficiency anemia due to chronic blood loss   . Benign neoplasm of ascending colon   . Benign neoplasm of cecum   . Gastritis without bleeding   . Parkinsonism due to drug ([place]) 07/22/2016  . Chronic bilateral low back pain without sciatica 04/07/2016  . Syncope 12/31/2015  . Status post coronary artery bypass grafting 12/31/2015  . S/P CABG x 4 12/25/2015  . Unstable angina ([place]) 12/01/2015  . Hypertensive heart disease   . Type II diabetes mellitus ([place])   . Abnormal  chest CT 07/10/2015  . Pulmonary nodules 07/09/2015  . Basal cell carcinoma of skin 05/27/2015  . Bipolar disorder ([place]) 05/27/2015  . CAD (coronary artery disease), native coronary artery 05/27/2015  . DDD (degenerative disc disease), lumbosacral 05/27/2015  . Esophageal reflux 05/27/2015  . Benign essential hypertension 05/27/2015  . Sleep apnea 05/27/2015  . Urethral stricture 05/27/2015  . Benign prostatic hypertrophy (BPH) with incomplete bladder emptying 11/29/2014  . CVA (cerebral infarction) 08/23/2014  . Hypertension   . S/P coronary artery stent placement 09/30/2011  . Hyperlipidemia 09/30/2011  . History of myocardial infarction 09/03/2011  . Seasonal allergies 09/03/2011    Past Surgical History:  Procedure Laterality Date  . BACK SURGERY     lumbar disc  . BACK SURGERY  1984  . CARDIAC CATHETERIZATION  6/10;2011;Aug.2012;Oct.  2012;   . CARDIAC CATHETERIZATION  05/2012   [facility]; EF 60%, patent stent in the left circumflex, moderate three-vessel disease with no flow-limiting lesions. Unchanged from most recent catheterization  . CARDIAC CATHETERIZATION N/A 12/10/2015   Procedure: Left Heart Cath and Coronary Angiography;  Surgeon: Corey Skains, MD;  Location: [place] [facility];  Service: Cardiovascular;  Laterality: N/A;  . CARPAL TUNNEL RELEASE  2009   left  . CATARACT EXTRACTION W/PHACO Left 02/14/2017   Procedure: CATARACT EXTRACTION PHACO AND INTRAOCULAR LENS PLACEMENT (IOC);  Surgeon: Birder Robson, MD;  Location: [facility];  Service: Ophthalmology;  Laterality: Left;  [place] 00:42 AP% 16.6 CDE 7.14 Fluid pack lot # [id number]  . CATARACT EXTRACTION W/PHACO Right 02/28/2017   Procedure: CATARACT EXTRACTION PHACO AND INTRAOCULAR LENS PLACEMENT (IOC);  Surgeon: Birder Robson, MD;  Location: [facility];  Service: Ophthalmology;  Laterality: Right;  [place] 01:09 AP% 11.4 CDE 7.98 Fluid pack lot # [id number]  . COLONOSCOPY WITH PROPOFOL N/A 06/29/2017   Procedure: COLONOSCOPY WITH PROPOFOL;  Surgeon: [name], MD;  Location: [place];  Service: Endoscopy;  Laterality: N/A;  Diabetic - oral meds  . CORONARY ANGIOPLASTY  2011 & 2001   s/p stent  . CORONARY ARTERY BYPASS GRAFT     stent placement  . CORONARY ARTERY BYPASS GRAFT N/A 12/25/2015   Procedure: CORONARY ARTERY BYPASS GRAFTING (CABG) x4  , using left internal mammary artery and right leg greater saphenous vein harvested endoscopically LIMA-LAD SVG-OM2 SVG-DIAG SVG-PD;  Surgeon: Melrose Nakayama, MD;  Location: [place];  Service: Open Heart Surgery;  Laterality: N/A;  . [place]  . CYST REMOVAL LEG Right   . ESOPHAGOGASTRODUODENOSCOPY  06/29/2017   Procedure: ESOPHAGOGASTRODUODENOSCOPY (EGD);  Surgeon: [name], MD;  Location: [place];  Service: Endoscopy;;  . POLYPECTOMY  06/29/2017   Procedure: POLYPECTOMY INTESTINAL;   Surgeon: [name], MD;  Location: [place];  Service: Endoscopy;;  . SKIN GRAFT    . TEE WITHOUT CARDIOVERSION N/A 12/25/2015   Procedure: TRANSESOPHAGEAL ECHOCARDIOGRAM (TEE);  Surgeon: Melrose Nakayama, MD;  Location: [place];  Service: Open Heart Surgery;  Laterality: N/A;  . TONSILLECTOMY    . UVULOPALATOPHARYNGOPLASTY      Prior to Admission medications   Medication Sig Start Date End Date Taking? Authorizing Provider  acetaminophen (TYLENOL) 500 MG tablet Take 1,000 mg by mouth every 6 (six) hours as needed for moderate pain or headache.     [provider]  amLODipine (NORVASC) 10 MG tablet Take 10 mg by mouth daily. pm 10/06/16   [provider]  ASPIRIN 81 PO Take by mouth daily.    [provider]  atorvastatin (LIPITOR) 40 MG tablet Take 1 tablet (40 mg total) by mouth daily at 6 PM. 12/30/15   Barrett, Erin R, PA-C  budesonide-formoterol (SYMBICORT) 160-4.5 MCG/ACT inhaler Inhale 2 puffs into the lungs 2 (two) times daily. 09/16/16   Flora Lipps, MD  buPROPion (WELLBUTRIN XL) 300 MG 24 hr tablet Take 300 mg by mouth daily. noon    [provider]  carvedilol (COREG) 12.5 MG tablet Take 12.5 mg by mouth 2 (two) times daily. Am and pm 09/27/16   [provider]  COMBIVENT RESPIMAT 20-100 MCG/ACT AERS respimat INHALE ONE PUFF INTO THE LUNGS EVERY 6 HOURS 08/22/18   Flora Lipps, MD  doxepin (SINEQUAN) 25 MG capsule Take 50 mg by mouth daily. pm 08/02/16   [provider]  fluticasone (FLONASE) 50 MCG/ACT nasal spray Place 2 sprays into both nostrils daily. Am and pm    [provider]  gabapentin (NEURONTIN) 300 MG capsule Take 300 mg by mouth 3 (three) times daily. Am, noon, pm 08/02/16   [provider]  Incontinence Supply Disposable (DEPEND ADJUSTABLE UNDERWEAR LG) MISC Wear pads tid 09/19/16   McGowan, Larene [place] A, PA-C  Ipratropium-Albuterol (COMBIVENT RESPIMAT) 20-100 MCG/ACT AERS respimat Inhale 2  puffs into the lungs every 6 (six) hours as needed for wheezing or shortness of breath.    [provider]  JANUMET 50-1000 MG tablet Take 1 tablet by mouth 2 (two) times daily. Am and pm 08/02/16   [provider]  lisinopril (PRINIVIL,ZESTRIL) 20 MG tablet Take 20 mg by mouth 2 (two) times daily. Am and pm 08/02/16   [provider]  LORazepam (ATIVAN) 1 MG tablet Take 1-2 tablets (1-2 mg total) by mouth 2 (two) times daily. Pt takes one tablet in the morning and two at bedtime. Patient taking differently: Take 1-2 mg by mouth See admin instructions. Take 1 mg by mouth in the morning and take 2 mg by mouth at bedtime 01/04/16   Gladstone Lighter, MD  nitrofurantoin, macrocrystal-monohydrate, (MACROBID) 100 MG capsule Take 1 capsule (100 mg total) by mouth 2 (two) times daily. 09/02/18   Coral Spikes, DO  nitroGLYCERIN (NITROLINGUAL) 0.4 MG/SPRAY spray Place 1 spray under the tongue every 5 (five) minutes x 3 doses as needed for chest pain.    [provider]  pantoprazole (PROTONIX) 40 MG tablet Take 40 mg by mouth daily. am    [provider]  risperidone (RISPERDAL) 4 MG tablet Take 4 mg by mouth at bedtime.  08/02/16   [provider]  SYMBICORT 160-4.5 MCG/ACT inhaler INHALE TWO PUFFS BY MOUTH INTO THE LUNGS TWICE A DAY 08/07/18   Flora Lipps, MD  tamsulosin (FLOMAX) 0.4 MG CAPS capsule TAKE 1 CAPSULE (0.4 MG TOTAL) BY MOUTH DAILY. 06/22/18   Hollice Espy, MD  topiramate (TOPAMAX) 100 MG tablet Take 100 mg by mouth daily. noon    [provider]  traMADol (ULTRAM) 50 MG tablet Take 1 tablet (50 mg total) by mouth every 8 (eight) hours as needed. 07/20/17   Coral Spikes, DO    Allergies Morphine and related; Synalgos-dc [aspirin-caff-dihydrocodeine]; and Synchlor a [chlorpheniramine]  Family History  Problem Relation Age of Onset  . Heart disease Mother   . Heart disease Brother   . Prostate cancer Father   . Heart disease Other    . Heart disease Brother   . Kidney disease Neg Hx   . Kidney cancer Neg Hx   . Bladder Cancer  Neg Hx     Social History Social History   Tobacco Use  . Smoking status: Former Smoker    Packs/day: 0.50    Years: 15.00    Pack years: 7.50    Types: Cigarettes    Last attempt to quit: 05/18/1999    Years since quitting: 19.3  . Smokeless tobacco: Never Used  . Tobacco comment: quit 17 + years  Substance Use Topics  . Alcohol use: No    Alcohol/week: 0.0 standard drinks  . Drug use: No    Review of Systems Constitutional: No fever/chills Eyes: No visual changes. ENT: No sore throat. Cardiovascular: Positive for chest pain. Respiratory: Denies shortness of breath. Gastrointestinal: No abdominal pain.  No nausea, no vomiting.  No diarrhea.  No constipation. Genitourinary: Negative for dysuria. Musculoskeletal: Negative for neck pain.  Negative for back pain. Integumentary: Negative for rash. Neurological: Negative for headaches, focal weakness or numbness.  ____________________________________________   PHYSICAL EXAM:  VITAL SIGNS: ED Triage Vitals  Enc Vitals Group     BP 09/05/18 0430 (!) 149/84     Pulse Rate 09/05/18 0430 66     Resp 09/05/18 0430 20     Temp 09/05/18 0430 97.7 F (36.5 C)     Temp Source 09/05/18 0430 Oral     SpO2 09/05/18 0427 98 %     Weight 09/05/18 0431 89.4 kg (197 lb 1.5 oz)     Height 09/05/18 0431 1.905 m (6\' 3" )     Head Circumference --      Peak Flow --      Pain Score 09/05/18 0430 4     Pain Loc --      Pain Edu? --      Excl. in [place]? --     Constitutional: Alert and oriented. Well appearing and in no acute distress. Eyes: Conjunctivae are normal. PERRL. EOMI. Head: Atraumatic. Mouth/Throat: Mucous membranes are moist. Oropharynx non-erythematous. Neck: No stridor.   Cardiovascular: Normal rate, regular rhythm. Good peripheral circulation. Grossly normal heart sounds. Respiratory: Normal respiratory effort.  No  retractions. No audible wheezing. Gastrointestinal: Soft and nontender. No distention.  Musculoskeletal: No lower extremity tenderness nor edema. No gross deformities of extremities. Neurologic:  Normal speech and language. No gross focal neurologic deficits are appreciated.  Skin:  Skin is warm, dry and intact. No rash noted. Psychiatric: Mood and affect are normal. Speech and behavior are normal.  ____________________________________________   LABS (all labs ordered are listed, but only abnormal results are displayed)  Labs Reviewed  BASIC METABOLIC PANEL - Abnormal; Notable for the following components:      Result Value   Glucose, Bld 134 (*)    BUN 33 (*)    Creatinine, Ser 1.28 (*)    Calcium 8.7 (*)    GFR calc non Af Amer 58 (*)    All other components within normal limits  CBC - Abnormal; Notable for the following components:   RBC 3.94 (*)    Hemoglobin 11.5 (*)    HCT 35.7 (*)    All other components within normal limits  TROPONIN I - Abnormal; Notable for the following components:   Troponin I 0.06 (*)    All other components within normal limits  URINALYSIS, COMPLETE (UACMP) WITH MICROSCOPIC - Abnormal; Notable for the following components:   Color, Urine AMBER (*)    APPearance TURBID (*)    Protein, ur 100 (*)    Nitrite POSITIVE (*)    Leukocytes,Ua LARGE (*)  WBC, UA >50 (*)    Bacteria, UA MANY (*)    All other components within normal limits  URINE CULTURE   ____________________________________________  EKG  ED ECG REPORT I, [place] N Cressie Betzler, the attending physician, personally viewed and interpreted this ECG.   Date: 09/05/2018  EKG Time: 4:35 AM  Rate: 62  Rhythm: Normal sinus rhythm with a left bundle branch block  Axis: Leftward axis  Intervals: Prolonged PR interval  ST&T Change: None  ____________________________________________  RADIOLOGY I, [place] N Desmen Schoffstall, personally viewed and evaluated these images (plain radiographs) as part  of my medical decision making, as well as reviewing the written report by the radiologist.  ED MD interpretation: No acute intracranial abnormality noted on CT scan of the head.   Official radiology report(s): Ct Head Wo Contrast  Result Date: 09/05/2018 CLINICAL DATA:  Ataxia, stroke suspected. Dizziness for 3 days. Symptoms became worse tonight. Recent urinary tract infection. EXAM: CT HEAD WITHOUT CONTRAST TECHNIQUE: Contiguous axial images were obtained from the base of the skull through the vertex without intravenous contrast. COMPARISON:  CT head without contrast 10/18/2016 FINDINGS: Brain: Remote lacunar infarcts of the basal ganglia are stable. Mild generalized atrophy and white matter disease is unchanged. Basal ganglia are otherwise intact. Insular ribbon is normal bilaterally. No acute or focal cortical abnormalities are evident. The ventricles are proportionate to the degree of atrophy. No significant extraaxial fluid collection is present. The brainstem and cerebellum are within normal limits. Vascular: Atherosclerotic changes are present within the cavernous internal carotid arteries bilaterally. There is no hyperdense vessel. Skull: Calvarium is intact. No focal lytic or blastic lesions are present. No significant extracranial soft tissue lesions are present. Sinuses/Orbits: The paranasal sinuses and mastoid air cells are clear. Bilateral lens replacements are noted. Globes and orbits are otherwise unremarkable. IMPRESSION: 1. No acute intracranial abnormality or significant interval change to explain the patient's dizziness or near syncope. 2. Stable atrophy and white matter disease. 3. Remote lacunar infarcts of the basal ganglia bilaterally are stable. Electronically Signed   By: San Morelle M.D.   On: 09/05/2018 05:38   Dg Chest Port 1 View  Result Date: 09/05/2018 CLINICAL DATA:  Dizziness. EXAM: PORTABLE CHEST 1 VIEW COMPARISON:  One-view chest x-ray 10/18/2016 FINDINGS: The  heart size is normal. Patient is status post median sternotomy for CABG. Atherosclerotic changes are noted at the aortic arch. There is no edema or effusion to suggest failure. Lung volumes are low. Mild bibasilar airspace opacities are present, left greater than right. The visualized soft tissues and bony thorax are unremarkable. IMPRESSION: 1. Mild bibasilar airspace opacities in the setting of low lung volumes likely reflect atelectasis. Infection is not excluded. 2. Aortic atherosclerosis. Electronically Signed   By: San Morelle M.D.   On: 09/05/2018 05:40    ____________________________________________   PROCEDURES   Procedure(s) performed (including Critical Care):  Procedures   ____________________________________________   INITIAL IMPRESSION / MDM / [place] / ED COURSE  As part of my medical decision making, I reviewed the following data within the electronic MEDICAL RECORD NUMBER  68 year old male presented with above-stated history and physical exam secondary to dizziness with chest pain.  EKG revealed no evidence of ischemia or infarction.  However laboratory data did reveal a elevated troponin at 0.06.  In addition urinalysis consistent with a urinary tract infection.  Patient given IV ceftriaxone 1 g.  Aspirin not administered secondary to allergy.  Patient discussed with Dr. Sidney Ace hospital admission further evaluation  and management.  *Roger Pruitt was evaluated in Emergency Department on 09/05/2018 for the symptoms described in the history of present illness. He was evaluated in the context of the global COVID-19 pandemic, which necessitated consideration that the patient might be at risk for infection with the SARS-CoV-2 virus that causes COVID-19. Institutional protocols and algorithms that pertain to the evaluation of patients at risk for COVID-19 are in a state of rapid change based on information released by regulatory bodies including the CDC and federal  and state organizations. These policies and algorithms were followed during the patient's care in the ED.  Some ED evaluations and interventions may be delayed as a result of limited staffing during the pandemic.*    ____________________________________________  FINAL CLINICAL IMPRESSION(S) / ED DIAGNOSES  Final diagnoses:  Near syncope  Elevated troponin  Acute cystitis without hematuria     MEDICATIONS GIVEN DURING THIS VISIT:  Medications  cefTRIAXone (ROCEPHIN) 1 g in sodium chloride 0.9 % 100 mL IVPB (has no administration in time range)     ED Discharge Orders    None       Note:  This document was prepared using Dragon voice recognition software and may include unintentional dictation errors.   Gregor Hams, MD 09/05/18 [id number]

## 2018-09-05 NOTE — ED Notes (Signed)
Coffee given, ok'd by Dr. Owens Shark. No other needs at this time.

## 2018-09-05 NOTE — ED Notes (Signed)
Pt assisted to stand at side of bed to use urinal. Pt has no other needs at this time and instructed to use call light for any further needs.

## 2018-09-05 NOTE — Telephone Encounter (Signed)
Reviewed chart with Dr. Mannie Stabile, okay to send in keflex x7 days BID. Patient contacted and made aware of all results, all questions answered.Will stop the nitrofurantoin and start keflex

## 2018-09-05 NOTE — ED Notes (Addendum)
Pt decided to leave AMA. Pt was informed of risks and had originally agreed to stay but decided he wanted to leave. Pt left prior to discharge papers being printed and reviewed. Spoke with caregiver to determine if pt still had IV in place and caregiver confirmed IV in place. RN informed them that he must return to ED to have it removed.

## 2018-09-07 LAB — URINE CULTURE: Culture: >=100000 — AB

## 2018-09-21 ENCOUNTER — Ambulatory Visit
Admission: EM | Admit: 2018-09-21 | Discharge: 2018-09-21 | Discharge status: Against Medical Advice | Payer: Medicare Other | Attending: Family Medicine | Admitting: Family Medicine

## 2018-09-21 ENCOUNTER — Other Ambulatory Visit: Payer: Self-pay

## 2018-09-21 ENCOUNTER — Ambulatory Visit: Admit: 2018-09-21 | Payer: Medicare Other

## 2018-09-21 ENCOUNTER — Encounter: Payer: Self-pay | Admitting: Emergency Medicine

## 2018-09-21 DIAGNOSIS — N5089 Other specified disorders of the male genital organs: Secondary | ICD-10-CM | POA: Diagnosis not present

## 2018-09-21 LAB — URINALYSIS, COMPLETE (UACMP) WITH MICROSCOPIC
Bilirubin Urine: NEGATIVE
Glucose, UA: NEGATIVE mg/dL
Ketones, ur: NEGATIVE mg/dL
Nitrite: POSITIVE — AB
Protein, ur: 30 mg/dL — AB
Specific Gravity, Urine: 1.02 (ref 1.005–1.030)
WBC, UA: >50 WBC/hpf (ref 0–5)
pH: 5.5 (ref 5.0–8.0)

## 2018-09-21 NOTE — ED Triage Notes (Signed)
Patient c/o testicle swelling and pain that started yesterday.

## 2018-09-21 NOTE — ED Provider Notes (Signed)
[facility]    CSN: [id number] Arrival date & time: 09/21/18  0920  History   Chief Complaint Chief Complaint  Patient presents with  . Testicle Pain   HPI   68 year old male presents with the above complaint.  2-day history of left testicle pain and swelling.  Patient states that this pain is currently 4/10 in severity.  Kept him up last night.  Patient states that she continues to urinate frequently.  Recently seen here for UTI.  Also recently seen in the ER on 6/10.  Troponin was elevated at that time.  Unclear why he did not get admitted to the hospital.  Patient denies fever.  Patient states that he has shortness of breath but states that he is at his baseline.  He states that he feels well other than feeling fatigue and dealing with the testicle pain and swelling.  No fall, trauma, injury.  No other associated symptoms.  No other complaints  PMH, Surgical Hx, Family Hx, Social History reviewed and updated as below.  Past Medical History:  Diagnosis Date  . Abdominal pain   . Acute cystitis without hematuria   . Anemia   . Anginal pain ([place])    rarely  . Arthritis    knees and hands  . Asthma   . Bipolar disorder ([place])    BIPOLAR/ SCHIZO/ AFFECTIVE   . BPD (bronchopulmonary dysplasia)   . BPH (benign prostatic hyperplasia)   . CAD (coronary artery disease)    a. s/p MI 2001, 2012;  b. s/p prior LCX stenting; b. 05/2012 Abnl MV; c. 05/2012 Cath: patent LCX stent w/ otw mod non-obs dzs->Med Rx.  . Carpal tunnel syndrome    surgery both hands  . Depression   . Disc degeneration, lumbar   . Dyspnea    WITH EXERTION  . Elevated PSA   . Esophageal reflux   . GERD (gastroesophageal reflux disease)   . Hematuria    history of gross and microscopic  . Hemorrhoid   . History of balanitis   . History of bladder infections   . History of echocardiogram    a. 07/2014 Echo: EF 60-65%, no rwma, nl LA size, nl RV/PASP.  Marland Kitchen History of urethral stricture   .  Hyperlipidemia   . Hypertension    controlled  . Hypertensive heart disease   . Incomplete bladder emptying   . Iron deficiency anemia, unspecified   . Lower urinary tract symptoms   . Myocardial infarction ([place])    X7 LAST 2001  . Neuropathy   . Nocturia   . Obsessive compulsive disorder   . Other dysphagia   . Panic attack   . Parkinson's disease ([place])   . Sleep apnea    SURGICAL TX  . Stroke ([place])   . Type II diabetes mellitus ([place])   . Unspecified vitamin D deficiency   . Urethral stricture   . Urinary retention   . Varicose vein of leg   . Vertigo    hx of  . Vitamin D deficiency   . Wears dentures    upper and lowers    Patient Active Problem List   Diagnosis Date Noted  . Chest pain 09/05/2018  . Iron deficiency anemia due to chronic blood loss   . Benign neoplasm of ascending colon   . Benign neoplasm of cecum   . Gastritis without bleeding   . Parkinsonism due to drug ([place]) 07/22/2016  . Chronic bilateral low back pain without sciatica  04/07/2016  . Syncope 12/31/2015  . Status post coronary artery bypass grafting 12/31/2015  . S/P CABG x 4 12/25/2015  . Unstable angina ([place]) 12/01/2015  . Hypertensive heart disease   . Type II diabetes mellitus ([place])   . Abnormal chest CT 07/10/2015  . Pulmonary nodules 07/09/2015  . Basal cell carcinoma of skin 05/27/2015  . Bipolar disorder ([place]) 05/27/2015  . CAD (coronary artery disease), native coronary artery 05/27/2015  . DDD (degenerative disc disease), lumbosacral 05/27/2015  . Esophageal reflux 05/27/2015  . Benign essential hypertension 05/27/2015  . Sleep apnea 05/27/2015  . Urethral stricture 05/27/2015  . Benign prostatic hypertrophy (BPH) with incomplete bladder emptying 11/29/2014  . CVA (cerebral infarction) 08/23/2014  . Hypertension   . S/P coronary artery stent placement 09/30/2011  . Hyperlipidemia 09/30/2011  . History of myocardial infarction 09/03/2011  . Seasonal allergies 09/03/2011     Past Surgical History:  Procedure Laterality Date  . BACK SURGERY     lumbar disc  . BACK SURGERY  1984  . CARDIAC CATHETERIZATION  6/10;2011;Aug.2012;Oct. 2012;   . CARDIAC CATHETERIZATION  05/2012   [facility]; EF 60%, patent stent in the left circumflex, moderate three-vessel disease with no flow-limiting lesions. Unchanged from most recent catheterization  . CARDIAC CATHETERIZATION N/A 12/10/2015   Procedure: Left Heart Cath and Coronary Angiography;  Surgeon: Corey Skains, MD;  Location: [place] [facility];  Service: Cardiovascular;  Laterality: N/A;  . CARPAL TUNNEL RELEASE  2009   left  . CATARACT EXTRACTION W/PHACO Left 02/14/2017   Procedure: CATARACT EXTRACTION PHACO AND INTRAOCULAR LENS PLACEMENT (IOC);  Surgeon: Birder Robson, MD;  Location: [facility];  Service: Ophthalmology;  Laterality: Left;  [place] 00:42 AP% 16.6 CDE 7.14 Fluid pack lot # [id number]  . CATARACT EXTRACTION W/PHACO Right 02/28/2017   Procedure: CATARACT EXTRACTION PHACO AND INTRAOCULAR LENS PLACEMENT (IOC);  Surgeon: Birder Robson, MD;  Location: [facility];  Service: Ophthalmology;  Laterality: Right;  [place] 01:09 AP% 11.4 CDE 7.98 Fluid pack lot # [id number]  . COLONOSCOPY WITH PROPOFOL N/A 06/29/2017   Procedure: COLONOSCOPY WITH PROPOFOL;  Surgeon: [name], MD;  Location: [place];  Service: Endoscopy;  Laterality: N/A;  Diabetic - oral meds  . CORONARY ANGIOPLASTY  2011 & 2001   s/p stent  . CORONARY ARTERY BYPASS GRAFT     stent placement  . CORONARY ARTERY BYPASS GRAFT N/A 12/25/2015   Procedure: CORONARY ARTERY BYPASS GRAFTING (CABG) x4  , using left internal mammary artery and right leg greater saphenous vein harvested endoscopically LIMA-LAD SVG-OM2 SVG-DIAG SVG-PD;  Surgeon: Melrose Nakayama, MD;  Location: [place];  Service: Open Heart Surgery;  Laterality: N/A;  . [place]  . CYST REMOVAL LEG Right   . ESOPHAGOGASTRODUODENOSCOPY  06/29/2017   Procedure:  ESOPHAGOGASTRODUODENOSCOPY (EGD);  Surgeon: [name], MD;  Location: [place];  Service: Endoscopy;;  . POLYPECTOMY  06/29/2017   Procedure: POLYPECTOMY INTESTINAL;  Surgeon: [name], MD;  Location: [place];  Service: Endoscopy;;  . SKIN GRAFT    . TEE WITHOUT CARDIOVERSION N/A 12/25/2015   Procedure: TRANSESOPHAGEAL ECHOCARDIOGRAM (TEE);  Surgeon: Melrose Nakayama, MD;  Location: [place];  Service: Open Heart Surgery;  Laterality: N/A;  . TONSILLECTOMY    . UVULOPALATOPHARYNGOPLASTY         Home Medications    Prior to Admission medications   Medication Sig Start Date End Date Taking? Authorizing Provider  acetaminophen (TYLENOL) 500 MG tablet Take 1,000  mg by mouth every 6 (six) hours as needed for moderate pain or headache.    Yes [provider]  amLODipine (NORVASC) 10 MG tablet Take 10 mg by mouth daily. pm 10/06/16  Yes [provider]  aspirin (ASPIRIN 81) 81 MG EC tablet Take 81 mg by mouth daily.    Yes [provider]  atorvastatin (LIPITOR) 40 MG tablet Take 1 tablet (40 mg total) by mouth daily at 6 PM. 12/30/15  Yes Barrett, Erin R, PA-C  budesonide-formoterol (SYMBICORT) 160-4.5 MCG/ACT inhaler Inhale 2 puffs into the lungs 2 (two) times daily. 09/16/16  Yes Kasa, Maretta Bees, MD  buPROPion (WELLBUTRIN XL) 300 MG 24 hr tablet Take 300 mg by mouth daily. noon   Yes [provider]  carvedilol (COREG) 12.5 MG tablet Take 12.5 mg by mouth 2 (two) times daily. Am and pm 09/27/16  Yes [provider]  COMBIVENT RESPIMAT 20-100 MCG/ACT AERS respimat INHALE ONE PUFF INTO THE LUNGS EVERY 6 HOURS 08/22/18  Yes Kasa, Maretta Bees, MD  doxepin (SINEQUAN) 25 MG capsule Take 50 mg by mouth daily. pm 08/02/16  Yes [provider]  fluticasone (FLONASE) 50 MCG/ACT nasal spray Place 2 sprays into both nostrils daily. Am and pm   Yes [provider]  gabapentin (NEURONTIN) 300 MG capsule Take 300 mg by mouth 3 (three)  times daily. Am, noon, pm 08/02/16  Yes [provider]  isosorbide mononitrate (IMDUR) 30 MG 24 hr tablet Take 30 mg by mouth daily. 08/13/18  Yes [provider]  JANUMET 50-1000 MG tablet Take 1 tablet by mouth 2 (two) times daily. Am and pm 08/02/16  Yes [provider]  lisinopril (PRINIVIL,ZESTRIL) 20 MG tablet Take 20 mg by mouth 2 (two) times daily. Am and pm 08/02/16  Yes [provider]  LORazepam (ATIVAN) 1 MG tablet Take 1-2 tablets (1-2 mg total) by mouth 2 (two) times daily. Pt takes one tablet in the morning and two at bedtime. Patient taking differently: Take 1-2 mg by mouth See admin instructions. Take 1 mg by mouth in the morning and take 2 mg by mouth at bedtime 01/04/16  Yes Gladstone Lighter, MD  nitroGLYCERIN (NITROLINGUAL) 0.4 MG/SPRAY spray Place 1 spray under the tongue every 5 (five) minutes x 3 doses as needed for chest pain.   Yes [provider]  pantoprazole (PROTONIX) 40 MG tablet Take 40 mg by mouth daily. am   Yes [provider]  risperidone (RISPERDAL) 4 MG tablet Take 4 mg by mouth at bedtime.  08/02/16  Yes [provider]  tamsulosin (FLOMAX) 0.4 MG CAPS capsule TAKE 1 CAPSULE (0.4 MG TOTAL) BY MOUTH DAILY. 06/22/18  Yes Hollice Espy, MD  topiramate (TOPAMAX) 100 MG tablet Take 100 mg by mouth daily. noon   Yes [provider]  traMADol (ULTRAM) 50 MG tablet Take 1 tablet (50 mg total) by mouth every 8 (eight) hours as needed. 07/20/17  Yes Coral Spikes, DO    Family History Family History  Problem Relation Age of Onset  . Heart disease Mother   . Heart disease Brother   . Prostate cancer Father   . Heart disease Other   . Heart disease Brother   . Kidney disease Neg Hx   . Kidney cancer Neg Hx   . Bladder Cancer Neg Hx     Social History Social History   Tobacco Use  . Smoking status: Former Smoker    Packs/day: 0.50    Years:  15.00    Pack years: 7.50    Types: Cigarettes     Quit date: 05/18/1999    Years since quitting: 19.3  . Smokeless tobacco: Never Used  . Tobacco comment: quit 17 + years  Substance Use Topics  . Alcohol use: No    Alcohol/week: 0.0 standard drinks  . Drug use: No     Allergies   Morphine and related, Synalgos-dc [aspirin-caff-dihydrocodeine], and Synchlor a [chlorpheniramine]   Review of Systems Review of Systems  Respiratory: Positive for shortness of breath.   Genitourinary: Positive for frequency, scrotal swelling and testicular pain.   Physical Exam Triage Vital Signs ED Triage Vitals  Enc Vitals Group     BP 09/21/18 0934 113/78     Pulse Rate 09/21/18 0934 90     Resp 09/21/18 0934 18     Temp 09/21/18 0934 98.4 F (36.9 C)     Temp Source 09/21/18 0934 Oral     SpO2 09/21/18 0934 98 %     Weight 09/21/18 0933 205 lb (93 kg)     Height 09/21/18 0933 6\' 3"  (1.905 m)     Head Circumference --      Peak Flow --      Pain Score 09/21/18 0932 4     Pain Loc --      Pain Edu? --      Excl. in [place]? --    Updated Vital Signs BP 113/78 (BP Location: Right Arm)   Pulse 90   Temp 98.4 F (36.9 C) (Oral)   Resp 18   Ht 6\' 3"  (1.905 m)   Wt 93 kg   SpO2 98%   BMI 25.62 kg/m   Visual Acuity Right Eye Distance:   Left Eye Distance:   Bilateral Distance:    Right Eye Near:   Left Eye Near:    Bilateral Near:     Physical Exam Vitals signs and nursing note reviewed.  Constitutional:      General: He is not in acute distress.    Appearance: Normal appearance.  HENT:     Head: Normocephalic and atraumatic.  Eyes:     General:        Right eye: No discharge.        Left eye: No discharge.     Conjunctiva/sclera: Conjunctivae normal.  Cardiovascular:     Rate and Rhythm: Normal rate and regular rhythm.     Heart sounds: Murmur present.  Pulmonary:     Effort: Pulmonary effort is normal.     Breath sounds: No wheezing, rhonchi or rales.  Genitourinary:    Penis: Normal.      Scrotum/Testes:         Left: Mass present.  Neurological:     Mental Status: He is alert.  Psychiatric:     Comments: Flat affect.  Depressed mood.    UC Treatments / Results  Labs (all labs ordered are listed, but only abnormal results are displayed) Labs Reviewed  URINALYSIS, COMPLETE (UACMP) WITH MICROSCOPIC - Abnormal; Notable for the following components:      Result Value   APPearance CLOUDY (*)    Hgb urine dipstick MODERATE (*)    Protein, ur 30 (*)    Nitrite POSITIVE (*)    Leukocytes,Ua MODERATE (*)    Bacteria, UA MANY (*)    All other components within normal limits    EKG None  Radiology No results found.  Procedures Procedures (including critical care time)  Medications Ordered in UC Medications - No data to display  Initial Impression / Assessment and Plan / UC Course  I have reviewed the triage vital signs and the nursing notes.  Pertinent labs & imaging results that were available during my care of the patient were reviewed by me and considered in my medical decision making (see chart for details).    68 year old male presents with left scrotal/testicular swelling.  Large mass noted on exam.  Urinalysis obtained.  Sending culture.  Ultrasound was ordered and patient was awaiting ultrasound when he left abruptly stating that he was tired of waiting.  Left AGAINST MEDICAL ADVICE.  Informed staff upfront that he was leaving.  He left so abruptly that I was not able to talk with him and try and get him to stay.  Final Clinical Impressions(s) / UC Diagnoses   Final diagnoses:  Testicular swelling   Discharge Instructions   None    ED Prescriptions    None     Controlled Substance Prescriptions [place] Controlled Substance Registry consulted? Not Applicable   Coral Spikes, DO 09/21/18 1052

## 2018-09-23 LAB — URINE CULTURE: Culture: >=100000 — AB

## 2018-09-24 ENCOUNTER — Telehealth (HOSPITAL_COMMUNITY): Payer: Self-pay | Admitting: Emergency Medicine

## 2018-09-24 MED ORDER — CIPROFLOXACIN HCL 500 MG PO TABS: 500.0000 mg | ORAL_TABLET | Freq: Two times a day (BID) | ORAL | 0 refills | Status: DC

## 2018-09-24 NOTE — Telephone Encounter (Signed)
Dr. Meda Coffee reviewed chart, okay to send Cipro 500 BID #[id number], pt contacted and made aware, will keep follow up appt with urology on the 1st.

## 2018-09-25 NOTE — Progress Notes (Signed)
09/26/2018 9:18 AM   Roger Pruitt Roger Pruitt 09/24/1950 [id number]  Referring provider: Casilda Carls, [address] [place],  [place] [address]  Chief Complaint  Patient presents with   Benign Prostatic Hypertrophy    HPI: [name] is a 68 year old male with a history of hematuria, BPH with LU TS and rUTI's who presents today for follow up.  History of hematuria Former smoker.  Hematuria work up x 3, 2011, 2015 & 2017 - findings positive for bilateral nephrolithiasis, bilateral renal cysts and enlarged friable prostate - s/p KTP in 2016.  No report of gross hematuria.  UA today is negative for AMH.     BPH WITH LUTS  (prostate and/or bladder) IPSS score: 18/4      PVR: 382 mL      Previous score: 16/5   Previous PVR: 170 mL   Major complaint(s):  Frequency, dysuria, nocturia and intermittency  x few weeks.  Denies any dysuria, hematuria or suprapubic pain.   Currently taking: Cipro and Flomax.  His has had KTP in 2016.    He has not cathed himself in a long time.     Denies any recent fevers, chills, nausea or vomiting.  He does not have a family history of PCa.  IPSS    Row Name 09/26/18 0800         International Prostate Symptom Score   How often have you had the sensation of not emptying your bladder?  Less than half the time     How often have you had to urinate less than every two hours?  More than half the time     How often have you found you stopped and started again several times when you urinated?  Less than half the time     How often have you found it difficult to postpone urination?  About half the time     How often have you had a weak urinary stream?  About half the time     How often have you had to strain to start urination?  Less than 1 in 5 times     How many times did you typically get up at night to urinate?  3 Times     Total IPSS Score  18       Quality of Life due to urinary symptoms   If you were to spend the rest of your life with your  urinary condition just the way it is now how would you feel about that?  Mostly Disatisfied        Score:  1-7 Mild 8-19 Moderate 20-35 Severe   rUTI's 01/2018 - multiple species 07/07/2018 - staphylococcus epidermidis - resistant to ciprofloxacin, oxacillin and trimethoprim/sulfamethoxazole 09/02/2018 - proteus mirabilis - resistant to nitrofurantoin 09/05/2018 - proteus mirabilis - resistant to nitrofurantoin - was to be admitted but left AMA - had no one to take care of dog 09/21/2018 pseudomonas aeruginosa - left AMA before scrotal ultrasound could be performed - said he was feeling better  He was recently seen in the ED/urgent care twice in June.  The first was for dizziness and was subsequently diagnosed with an UTI and left AMA.  The second was for a swollen testicle and left AMA.  He was contacted later with the urine culture results and started on Cipro.  UA was nitrite positive, 11-20 RBC's, > 50 WBC's and any bacteria.    Today, he states the left testicle is still hard  and tender.  He does admit there is some improvement with the tenderness since starting the Cipro yesterday.    His UA today is improved with > 30 WBC's only.  He states this started three days ago.     PMH: Past Medical History:  Diagnosis Date   Abdominal pain    Acute cystitis without hematuria    Anemia    Anginal pain ([place])    rarely   Arthritis    knees and hands   Asthma    Bipolar disorder ([place])    BIPOLAR/ SCHIZO/ AFFECTIVE    BPD (bronchopulmonary dysplasia)    BPH (benign prostatic hyperplasia)    CAD (coronary artery disease)    a. s/p MI 2001, 2012;  b. s/p prior LCX stenting; b. 05/2012 Abnl MV; c. 05/2012 Cath: patent LCX stent w/ otw mod non-obs dzs->Med Rx.   Carpal tunnel syndrome    surgery both hands   Depression    Disc degeneration, lumbar    Dyspnea    WITH EXERTION   Elevated PSA    Esophageal reflux    GERD (gastroesophageal reflux disease)    Hematuria     history of gross and microscopic   Hemorrhoid    History of balanitis    History of bladder infections    History of echocardiogram    a. 07/2014 Echo: EF 60-65%, no rwma, nl LA size, nl RV/PASP.   History of urethral stricture    Hyperlipidemia    Hypertension    controlled   Hypertensive heart disease    Incomplete bladder emptying    Iron deficiency anemia, unspecified    Lower urinary tract symptoms    Myocardial infarction ([place])    X7 LAST 2001   Neuropathy    Nocturia    Obsessive compulsive disorder    Other dysphagia    Panic attack    Parkinson's disease ([place])    Sleep apnea    SURGICAL TX   Stroke ([place])    Type II diabetes mellitus ([place])    Unspecified vitamin D deficiency    Urethral stricture    Urinary retention    Varicose vein of leg    Vertigo    hx of   Vitamin D deficiency    Wears dentures    upper and lowers    Surgical History: Past Surgical History:  Procedure Laterality Date   BACK SURGERY     lumbar disc   BACK SURGERY  1984   CARDIAC CATHETERIZATION  6/10;2011;Aug.2012;Oct. 2012;    CARDIAC CATHETERIZATION  05/2012   [facility]; EF 60%, patent stent in the left circumflex, moderate three-vessel disease with no flow-limiting lesions. Unchanged from most recent catheterization   CARDIAC CATHETERIZATION N/A 12/10/2015   Procedure: Left Heart Cath and Coronary Angiography;  Surgeon: Corey Skains, MD;  Location: [facility];  Service: Cardiovascular;  Laterality: N/A;   CARPAL TUNNEL RELEASE  2009   left   CATARACT EXTRACTION W/PHACO Left 02/14/2017   Procedure: CATARACT EXTRACTION PHACO AND INTRAOCULAR LENS PLACEMENT ([place]);  Surgeon: Birder Robson, MD;  Location: [facility];  Service: Ophthalmology;  Laterality: Left;  [place] 00:42 AP% 16.6 CDE 7.14 Fluid pack lot # [id number]   CATARACT EXTRACTION W/PHACO Right 02/28/2017   Procedure: CATARACT EXTRACTION PHACO AND INTRAOCULAR LENS PLACEMENT ([facility]);   Surgeon: Birder Robson, MD;  Location: [facility];  Service: Ophthalmology;  Laterality: Right;  [place] 01:09 AP% 11.4 CDE 7.98 Fluid pack lot # [id number]  COLONOSCOPY WITH PROPOFOL N/A 06/29/2017   Procedure: COLONOSCOPY WITH PROPOFOL;  Surgeon: [name], MD;  Location: [facility];  Service: Endoscopy;  Laterality: N/A;  Diabetic - oral meds   CORONARY ANGIOPLASTY  2011 & 2001   s/p stent   CORONARY ARTERY BYPASS GRAFT     stent placement   CORONARY ARTERY BYPASS GRAFT N/A 12/25/2015   Procedure: CORONARY ARTERY BYPASS GRAFTING (CABG) x4  , using left internal mammary artery and right leg greater saphenous vein harvested endoscopically LIMA-LAD SVG-OM2 SVG-DIAG SVG-PD;  Surgeon: Melrose Nakayama, MD;  Location: [facility];  Service: Open Heart Surgery;  Laterality: N/A;   COSMETIC SURGERY  1967   CYST REMOVAL LEG Right    ESOPHAGOGASTRODUODENOSCOPY  06/29/2017   Procedure: ESOPHAGOGASTRODUODENOSCOPY (EGD);  Surgeon: [name], MD;  Location: [facility];  Service: Endoscopy;;   POLYPECTOMY  06/29/2017   Procedure: POLYPECTOMY INTESTINAL;  Surgeon: [name], MD;  Location: [facility];  Service: Endoscopy;;   SKIN GRAFT     TEE WITHOUT CARDIOVERSION N/A 12/25/2015   Procedure: TRANSESOPHAGEAL ECHOCARDIOGRAM (TEE);  Surgeon: Melrose Nakayama, MD;  Location: [facility];  Service: Open Heart Surgery;  Laterality: N/A;   TONSILLECTOMY     UVULOPALATOPHARYNGOPLASTY      Home Medications:  Allergies as of 09/26/2018      Reactions   Morphine And Related Nausea And Vomiting   Synalgos-dc [aspirin-caff-dihydrocodeine] Itching, Other (See Comments)   Reaction:  Stinging    Synchlor A [chlorpheniramine] Rash, Other (See Comments)   Patient doesn't recall this allergy.      Medication List       Accurate as of September 26, 2018  9:18 AM. If you have any questions, ask your nurse or doctor.        acetaminophen 500 MG tablet Commonly known as: TYLENOL Take  1,000 mg by mouth every 6 (six) hours as needed for moderate pain or headache.   amLODipine 10 MG tablet Commonly known as: NORVASC Take 10 mg by mouth daily. pm   Aspirin 81 81 MG EC tablet Generic drug: aspirin Take 81 mg by mouth daily.   atorvastatin 40 MG tablet Commonly known as: LIPITOR Take 1 tablet (40 mg total) by mouth daily at 6 PM.   budesonide-formoterol 160-4.5 MCG/ACT inhaler Commonly known as: Symbicort Inhale 2 puffs into the lungs 2 (two) times daily.   buPROPion 300 MG 24 hr tablet Commonly known as: WELLBUTRIN XL Take 300 mg by mouth daily. noon   carvedilol 12.5 MG tablet Commonly known as: COREG Take 12.5 mg by mouth 2 (two) times daily. Am and pm   ciprofloxacin 500 MG tablet Commonly known as: CIPRO Take 1 tablet (500 mg total) by mouth every 12 (twelve) hours. What changed: when to take this Changed by: Lithzy Bernard, PA-C   Combivent Respimat 20-100 MCG/ACT Aers respimat Generic drug: Ipratropium-Albuterol INHALE ONE PUFF INTO THE LUNGS EVERY 6 HOURS   doxepin 25 MG capsule Commonly known as: SINEQUAN Take 50 mg by mouth daily. pm   fluticasone 50 MCG/ACT nasal spray Commonly known as: FLONASE Place 2 sprays into both nostrils daily. Am and pm   gabapentin 300 MG capsule Commonly known as: NEURONTIN Take 300 mg by mouth 3 (three) times daily. Am, noon, pm   isosorbide mononitrate 30 MG 24 hr tablet Commonly known as: IMDUR Take 30 mg by mouth daily.   Janumet 50-1000 MG tablet Generic drug: sitaGLIPtin-metformin Take 1 tablet by mouth 2 (two)  times daily. Am and pm   lisinopril 20 MG tablet Commonly known as: ZESTRIL Take 20 mg by mouth 2 (two) times daily. Am and pm   LORazepam 1 MG tablet Commonly known as: ATIVAN Take 1-2 tablets (1-2 mg total) by mouth 2 (two) times daily. Pt takes one tablet in the morning and two at bedtime. What changed:   when to take this  additional instructions   nitroGLYCERIN 0.4 MG/SPRAY  spray Commonly known as: NITROLINGUAL Place 1 spray under the tongue every 5 (five) minutes x 3 doses as needed for chest pain.   pantoprazole 40 MG tablet Commonly known as: PROTONIX Take 40 mg by mouth daily. am   risperidone 4 MG tablet Commonly known as: RISPERDAL Take 4 mg by mouth at bedtime.   tamsulosin 0.4 MG Caps capsule Commonly known as: FLOMAX TAKE 1 CAPSULE (0.4 MG TOTAL) BY MOUTH DAILY.   topiramate 100 MG tablet Commonly known as: TOPAMAX Take 100 mg by mouth daily. noon   traMADol 50 MG tablet Commonly known as: ULTRAM Take 1 tablet (50 mg total) by mouth every 8 (eight) hours as needed.       Allergies:  Allergies  Allergen Reactions   Morphine And Related Nausea And Vomiting   Synalgos-Dc [Aspirin-Caff-Dihydrocodeine] Itching and Other (See Comments)    Reaction:  Stinging    Synchlor A [Chlorpheniramine] Rash and Other (See Comments)    Patient doesn't recall this allergy.    Family History: Family History  Problem Relation Age of Onset   Heart disease Mother    Heart disease Brother    Prostate cancer Father    Heart disease Other    Heart disease Brother    Kidney disease Neg Hx    Kidney cancer Neg Hx    Bladder Cancer Neg Hx     Social History:  reports that he quit smoking about 19 years ago. His smoking use included cigarettes. He has a 7.50 pack-year smoking history. He has never used smokeless tobacco. He reports that he does not drink alcohol or use drugs.  ROS: UROLOGY Frequent Urination?: Yes Hard to postpone urination?: No Burning/pain with urination?: Yes Get up at night to urinate?: Yes Leakage of urine?: No Urine stream starts and stops?: No Trouble starting stream?: Yes Do you have to strain to urinate?: No Blood in urine?: No Urinary tract infection?: Yes Sexually transmitted disease?: No Injury to kidneys or bladder?: No Painful intercourse?: No Weak stream?: No Erection problems?: Yes Penile pain?:  No  Gastrointestinal Nausea?: No Vomiting?: No Indigestion/heartburn?: No Diarrhea?: No Constipation?: No  Constitutional Fever: No Night sweats?: No Weight loss?: No Fatigue?: Yes  Skin Skin rash/lesions?: No Itching?: No  Eyes Blurred vision?: No Double vision?: No  Ears/Nose/Throat Sore throat?: No Sinus problems?: Yes  Hematologic/Lymphatic Swollen glands?: No Easy bruising?: Yes  Cardiovascular Leg swelling?: No Chest pain?: Yes  Respiratory Cough?: No Shortness of breath?: Yes  Endocrine Excessive thirst?: Yes  Musculoskeletal Back pain?: Yes Joint pain?: No  Neurological Headaches?: No Dizziness?: Yes  Psychologic Depression?: Yes Anxiety?: No  Physical Exam: BP 136/82 (BP Location: Left Arm, Patient Position: Sitting, Cuff Size: Normal)    Pulse 61    Ht '6\' 3"'  (1.905 m)    Wt 205 lb (93 kg)    BMI 25.62 kg/m   Constitutional:  Well nourished. Alert and oriented, No acute distress. HEENT: [place] AT, moist mucus membranes.  Trachea midline, no masses. Cardiovascular: No clubbing, cyanosis, or edema. Respiratory:  Normal respiratory effort, no increased work of breathing. GI: Abdomen is soft, non tender, non distended, no abdominal masses. Liver and spleen not palpable.  No hernias appreciated.  Stool sample for occult testing is not indicated.   GU: No CVA tenderness.  No bladder fullness or masses.  Patient with uncircumcised phallus.  Foreskin easily retracted.   Urethral meatus is patent.  No penile discharge. No penile lesions or rashes. Scrotum without lesions, cysts, rashes and/or edema.  Right testicle is located scrotally. No masses are appreciated in the right testicle.. Right epididymis is normal.  Left testicle and epididymis are indurated and slightly tender.  No fluctuance, no erythema and no crepitus are noted in the left hemiscrotum.   Rectal: Patient with  normal sphincter tone. Anus and perineum without scarring or rashes. No rectal  masses are appreciated. Prostate is approximately 50 grams, 1 cm rubbery, tender nodule is appreciated in the right apex.   Skin: No rashes, bruises or suspicious lesions. Lymph: No inguinal adenopathy. Neurologic: Grossly intact, no focal deficits, moving all 4 extremities. Psychiatric: Normal mood and affect.  Laboratory Data: Lab Results  Component Value Date   WBC 6.4 09/05/2018   HGB 11.5 (L) 09/05/2018   HCT 35.7 (L) 09/05/2018   MCV 90.6 09/05/2018   PLT 175 09/05/2018    Lab Results  Component Value Date   CREATININE 1.28 (H) 09/05/2018    No results found for: PSA  No results found for: TESTOSTERONE  Lab Results  Component Value Date   HGBA1C 5.1 12/22/2015    Lab Results  Component Value Date   TSH 0.682 07/28/2015       Component Value Date/Time   CHOL 91 09/05/2018 0438   CHOL 124 01/31/2014 0448   HDL 27 (L) 09/05/2018 0438   HDL 25 (L) 01/31/2014 0448   CHOLHDL 3.4 09/05/2018 0438   VLDL 12 09/05/2018 0438   VLDL 23 01/31/2014 0448   LDLCALC 52 09/05/2018 0438   LDLCALC 76 01/31/2014 0448    Lab Results  Component Value Date   AST 16 10/18/2016   Lab Results  Component Value Date   ALT 13 (L) 10/18/2016   No components found for: ALKALINEPHOPHATASE No components found for: BILIRUBINTOTAL  No results found for: ESTRADIOL  Urinalysis > 30 WBC.  See Epic. I have reviewed the labs.   Pertinent Imaging: Results for TEYTON, PATTILLO (MRN [id number]) as of 09/26/2018 09:47  Ref. Range 09/26/2018 08:49  Scan Result Unknown [id number]    Assessment & Plan:    1. Left epididymitis and orchitis Continue Cipro for two weeks Return in 3 weeks for recheck Patient is advised that if they should start to experience pain that is not able to be controlled with pain medication, intractable nausea and/or vomiting and/or fevers greater than 103 or shaking chills to contact the office immediately or seek treatment in the emergency department for emergent  intervention.    2. Urinary retention Patient is advised to start with CIC again Explained to him that harboring large residuals puts him at risk for UTI's and infections in the testicle/prostate He will self cath to keep residuals below 300 cc Will contact catheter company to get catheters set to his home - he is given samples of strait caths today  Patient caths two times daily, indefinitely due to urinary retention.   3. History of hematuria Hematuria work up completed in 2011, 2015 and 2017 - findings positive for bilateral nephrolithiasis, bilateral renal cysts and  enlarged friable prostate No report of gross hematuria  UA today with no AMH RTC in one year for UA - patient to report any gross hematuria in the interim    4. BPH with LUTS IPSS score is 18/4, it is worsening Continue conservative management, avoiding bladder irritants and timed voiding's Most bothersome symptoms is/are frequency, dysuria, nocturia and intermittency - due to UTI and retention Continue tamsulosin 0.4 mg daily; refills given RTC in 3 weeks for I PSS and exam  5. Prostate nodule Currently with active infection RTC in 3 weeks for exam - if persists after he has recovered from his infection may need to consider biopsy   6. rUTI's Criteria for recurrent UTI has been met with 2 or more infections in 6 months or 3 or greater infections in one year  Explained to the patient that a likely source of infection for him is his large residuals and that he will need to be diligent with CIC to keep his residuals low to help stave off infections                                      Return in about 3 weeks (around 10/17/2018) for recheck .  These notes generated with voice recognition software. I apologize for typographical errors.  Zara Council, PA-C  [facility] [address]  [place], [place] [address] [phone]

## 2018-09-26 ENCOUNTER — Other Ambulatory Visit: Payer: Self-pay

## 2018-09-26 ENCOUNTER — Ambulatory Visit (INDEPENDENT_AMBULATORY_CARE_PROVIDER_SITE_OTHER): Payer: Medicare Other | Admitting: Urology

## 2018-09-26 ENCOUNTER — Telehealth: Payer: Self-pay | Admitting: Urology

## 2018-09-26 ENCOUNTER — Encounter: Payer: Self-pay | Admitting: Urology

## 2018-09-26 VITALS — BP 136/82 | HR 61 | Ht 75.0 in | Wt 205.0 lb

## 2018-09-26 DIAGNOSIS — N453 Epididymo-orchitis: Secondary | ICD-10-CM | POA: Diagnosis not present

## 2018-09-26 DIAGNOSIS — N138 Other obstructive and reflux uropathy: Secondary | ICD-10-CM | POA: Diagnosis not present

## 2018-09-26 DIAGNOSIS — N402 Nodular prostate without lower urinary tract symptoms: Secondary | ICD-10-CM | POA: Diagnosis not present

## 2018-09-26 DIAGNOSIS — R339 Retention of urine, unspecified: Secondary | ICD-10-CM | POA: Diagnosis not present

## 2018-09-26 DIAGNOSIS — N390 Urinary tract infection, site not specified: Secondary | ICD-10-CM

## 2018-09-26 DIAGNOSIS — Z87448 Personal history of other diseases of urinary system: Secondary | ICD-10-CM | POA: Diagnosis not present

## 2018-09-26 DIAGNOSIS — N401 Enlarged prostate with lower urinary tract symptoms: Secondary | ICD-10-CM

## 2018-09-26 LAB — URINALYSIS, COMPLETE
Bilirubin, UA: NEGATIVE
Glucose, UA: NEGATIVE
Ketones, UA: NEGATIVE
Nitrite, UA: NEGATIVE
Protein,UA: NEGATIVE
RBC, UA: NEGATIVE
Specific Gravity, UA: 1.015 (ref 1.005–1.030)
Urobilinogen, Ur: 1 mg/dL (ref 0.2–1.0)
pH, UA: 5.5 (ref 5.0–7.5)

## 2018-09-26 LAB — MICROSCOPIC EXAMINATION
Bacteria, UA: NONE SEEN
Epithelial Cells (non renal): >10 /hpf — AB (ref 0–10)
RBC, Urine: NONE SEEN /hpf (ref 0–2)
WBC, UA: >30 /hpf — AB (ref 0–5)

## 2018-09-26 LAB — BLADDER SCAN AMB NON-IMAGING: Scan Result: 382

## 2018-09-26 MED ORDER — TAMSULOSIN HCL 0.4 MG PO CAPS: 0.4000 mg | ORAL_CAPSULE | Freq: Every day | ORAL | 3 refills | Status: AC

## 2018-09-26 MED ORDER — CIPROFLOXACIN HCL 500 MG PO TABS: 500.0000 mg | ORAL_TABLET | Freq: Two times a day (BID) | ORAL | 0 refills | Status: DC

## 2018-09-26 NOTE — Telephone Encounter (Signed)
Would you order self caths for this patient?  I gave him a sample bag of different catheters, so he can decide which one works the best for him.

## 2018-10-03 ENCOUNTER — Telehealth: Payer: Self-pay | Admitting: Internal Medicine

## 2018-10-03 DIAGNOSIS — R079 Chest pain, unspecified: Secondary | ICD-10-CM | POA: Diagnosis not present

## 2018-10-03 DIAGNOSIS — R32 Unspecified urinary incontinence: Secondary | ICD-10-CM | POA: Diagnosis not present

## 2018-10-03 DIAGNOSIS — F319 Bipolar disorder, unspecified: Secondary | ICD-10-CM | POA: Diagnosis not present

## 2018-10-03 DIAGNOSIS — J449 Chronic obstructive pulmonary disease, unspecified: Secondary | ICD-10-CM | POA: Diagnosis not present

## 2018-10-03 DIAGNOSIS — R0600 Dyspnea, unspecified: Secondary | ICD-10-CM | POA: Diagnosis not present

## 2018-10-03 DIAGNOSIS — D509 Iron deficiency anemia, unspecified: Secondary | ICD-10-CM | POA: Diagnosis not present

## 2018-10-03 NOTE — Telephone Encounter (Signed)
Spoke to patient. He states he likes the 75fr straight cath. The order has been sent to [facility]

## 2018-10-03 NOTE — Telephone Encounter (Signed)

## 2018-10-04 ENCOUNTER — Other Ambulatory Visit: Payer: Self-pay

## 2018-10-04 ENCOUNTER — Encounter: Payer: Self-pay | Admitting: Internal Medicine

## 2018-10-04 ENCOUNTER — Ambulatory Visit (INDEPENDENT_AMBULATORY_CARE_PROVIDER_SITE_OTHER): Payer: Medicare Other | Admitting: Internal Medicine

## 2018-10-04 VITALS — BP 140/100 | HR 59 | Temp 97.2°F | Ht 75.0 in | Wt 208.6 lb

## 2018-10-04 DIAGNOSIS — J449 Chronic obstructive pulmonary disease, unspecified: Secondary | ICD-10-CM

## 2018-10-04 DIAGNOSIS — R0689 Other abnormalities of breathing: Secondary | ICD-10-CM

## 2018-10-04 NOTE — Progress Notes (Signed)
[facility] Consultation      MRN# [id number] Roger Pruitt 1951-01-22  SYNOPSIS Has a history of coronary artery disease hypertensive heart disease syncope prior history of sleep apnea and gastritis. +COPD with chronic cough-Symbicort Combivent for his breathing.  He also is on Flonase for his allergies.  +history of sleep apnea and has a history of a UPPP.  He states that he has not had a follow-up as far as the surgery is concerned.  He also noted that his wife states that he still snores.  Apparently also had a history of 2 mini strokes in the past. Patient has pulmonary nodules which were followed from 2017 until the end of 2018.  The final recommendations from the radiology report was that no further follow-up is necessary because the nodules have been stable.    Pulmonary nodules have been stable since 2017  Patient is a deconditioned state Previous PFT shows scooping of the expiratory limb suggestive of obstruction     CC:  Follow-up COPD   HPI Patient has chronic cough Chronic chest congestion Chronic shortness of breath and dyspnea exertion Patient with a history of quadruple CABG Patient with previous office visit for COPD exacerbation was given antibiotics and prednisone  Explained to patient that he needs to be tested for exertional and nocturnal hypoxia We will check 6-minute walk test and ono  Patient states he takes his inhalers daily  No signs of infection at this time No signs of exacerbation at this time    Review of Systems  Constitutional: Negative for chills and fever.  Eyes: Negative for blurred vision.  Respiratory: Positive for cough and shortness of breath. Negative for hemoptysis, sputum production and wheezing.   Cardiovascular: Negative for chest pain, palpitations and orthopnea.  Neurological: Negative for dizziness and headaches.  Endo/Heme/Allergies: Does not bruise/bleed easily.  Psychiatric/Behavioral: Negative for  depression and substance abuse.    Physical Examination:   GENERAL:NAD, no fevers, chills, no weakness no fatigue HEAD: Normocephalic, atraumatic.  EYES: PERLA, EOMI No scleral icterus.  NECK: Supple. No thyromegaly.  No JVD.  PULMONARY: CTA B/L no wheezing, rhonchi, crackles CARDIOVASCULAR: S1 and S2. Regular rate and rhythm. No murmurs GASTROINTESTINAL: Soft, nontender, nondistended. Positive bowel sounds.  MUSCULOSKELETAL: No swelling, clubbing, or edema.  NEUROLOGIC: No gross focal neurological deficits. 5/5 strength all extremities SKIN: No ulceration, lesions, rashes, or cyanosis.  PSYCHIATRIC: Insight, judgment intact. -depression -anxiety ALL OTHER ROS ARE NEGATIVE      Allergies:  Morphine and related, Synalgos-dc [aspirin-caff-dihydrocodeine], and Synchlor a [chlorpheniramine]   CT Chest 02/2018 I have Independently reviewed images of  CT chest   Interpretation: Stable small solid appearing pulmonary nodules. The largest measures 6 mm. Stable since 2017  Assessment and Plan:   68 year old pleasant white male seen today for follow-up assessment for COPD, gold stage C  Patient has progressive shortness of breath and dyspnea exertion and will need to be tested for exertional and nocturnal hypoxia Check 6-minute walk test Check overnight pulse oximetry Continue inhalers as prescribed    COVID-19 EDUCATION: The signs and symptoms of COVID-19 were discussed with the patient and how to seek care for testing.  The importance of social distancing was discussed today. Hand Washing Techniques and avoid touching face was advised.  MEDICATION ADJUSTMENTS/LABS AND TESTS ORDERED: 6 Minute walk test Overnight pulse oximetry   CURRENT MEDICATIONS REVIEWED AT LENGTH WITH PATIENT TODAY   Patient satisfied with Plan of action and management. All questions answered  Follow up in 6 months   Birch Farino Patricia Pesa, M.D.  [facility]  Medical  Director [place] Director [facility] Cardio-Pulmonary Department

## 2018-10-04 NOTE — Patient Instructions (Addendum)
Check 6MWT Check Overnight Pulse oximetry

## 2018-10-08 ENCOUNTER — Encounter: Payer: Self-pay | Admitting: Internal Medicine

## 2018-10-08 ENCOUNTER — Other Ambulatory Visit: Payer: Self-pay | Admitting: Internal Medicine

## 2018-10-15 ENCOUNTER — Ambulatory Visit: Payer: Medicare Other | Admitting: Urology

## 2018-10-15 DIAGNOSIS — J449 Chronic obstructive pulmonary disease, unspecified: Secondary | ICD-10-CM | POA: Diagnosis not present

## 2018-10-16 ENCOUNTER — Telehealth: Payer: Self-pay | Admitting: Internal Medicine

## 2018-10-16 NOTE — Telephone Encounter (Signed)
ONO reviewed by Dr. Mortimer Fries- no oxygen is needed with sleep.  Pt is aware of results and voiced his understanding.  Nothing further is needed.

## 2018-10-17 ENCOUNTER — Ambulatory Visit: Payer: Medicare Other

## 2018-10-18 ENCOUNTER — Ambulatory Visit: Payer: Medicare Other

## 2018-10-22 ENCOUNTER — Ambulatory Visit (INDEPENDENT_AMBULATORY_CARE_PROVIDER_SITE_OTHER): Payer: Medicare Other

## 2018-10-22 ENCOUNTER — Other Ambulatory Visit: Payer: Self-pay

## 2018-10-22 DIAGNOSIS — J449 Chronic obstructive pulmonary disease, unspecified: Secondary | ICD-10-CM

## 2018-10-22 NOTE — Progress Notes (Signed)
SIX MIN WALK 10/22/2018 10/23/2015  Medications patient did not take any, says he forgot Wellbutrin, Coreg, Proscar, gabapentin, Lisinopril, Ativan, Mag-Ox, Metformin, Protonix, ranexa, Flomax, Topamax  Supplimental Oxygen during Test? (L/min) No No  Laps 1.5 5  Partial Lap (in Meters) - 28  Baseline BP (sitting) 148/82 120/68  Baseline Heartrate 61 85  Baseline Dyspnea (Borg Scale) - 3  Baseline Fatigue (Borg Scale) - 4  Baseline SPO2 - 98  BP (sitting) - 150/76  Heartrate - 98  Dyspnea (Borg Scale) - 5  Fatigue (Borg Scale) - 6  SPO2 - 99  BP (sitting) - 132/78  Heartrate - 88  SPO2 - 97  Stopped or Paused before Six Minutes - No  Distance Completed - 268  Tech Comments: - pt walked as moderate as he could.

## 2018-10-23 DIAGNOSIS — R0600 Dyspnea, unspecified: Secondary | ICD-10-CM | POA: Diagnosis not present

## 2018-10-23 DIAGNOSIS — R55 Syncope and collapse: Secondary | ICD-10-CM | POA: Diagnosis not present

## 2018-10-23 DIAGNOSIS — R0789 Other chest pain: Secondary | ICD-10-CM | POA: Diagnosis not present

## 2018-10-23 DIAGNOSIS — I10 Essential (primary) hypertension: Secondary | ICD-10-CM | POA: Diagnosis not present

## 2018-10-23 DIAGNOSIS — G4733 Obstructive sleep apnea (adult) (pediatric): Secondary | ICD-10-CM | POA: Diagnosis not present

## 2018-10-23 DIAGNOSIS — Z951 Presence of aortocoronary bypass graft: Secondary | ICD-10-CM | POA: Diagnosis not present

## 2018-10-23 DIAGNOSIS — I2510 Atherosclerotic heart disease of native coronary artery without angina pectoris: Secondary | ICD-10-CM | POA: Diagnosis not present

## 2018-10-23 DIAGNOSIS — I25118 Atherosclerotic heart disease of native coronary artery with other forms of angina pectoris: Secondary | ICD-10-CM | POA: Diagnosis not present

## 2018-10-23 DIAGNOSIS — E782 Mixed hyperlipidemia: Secondary | ICD-10-CM | POA: Diagnosis not present

## 2018-11-12 DIAGNOSIS — R55 Syncope and collapse: Secondary | ICD-10-CM | POA: Diagnosis not present

## 2018-11-15 DIAGNOSIS — T7840XD Allergy, unspecified, subsequent encounter: Secondary | ICD-10-CM | POA: Diagnosis not present

## 2018-11-21 ENCOUNTER — Telehealth: Payer: Self-pay | Admitting: Internal Medicine

## 2018-11-21 MED ORDER — BUDESONIDE-FORMOTEROL FUMARATE 160-4.5 MCG/ACT IN AERO: 2.0000 | INHALATION_SPRAY | Freq: Two times a day (BID) | RESPIRATORY_TRACT | 1 refills | Status: DC

## 2018-11-21 MED ORDER — COMBIVENT RESPIMAT 20-100 MCG/ACT IN AERS: INHALATION_SPRAY | RESPIRATORY_TRACT | 5 refills | Status: AC

## 2018-11-21 MED ORDER — FLUTICASONE PROPIONATE 50 MCG/ACT NA SUSP: 2.0000 | Freq: Every day | NASAL | 2 refills | Status: DC

## 2018-11-21 NOTE — Telephone Encounter (Signed)
Rx for combivent, symbicort and flonase has been sent to preferred pharmacy. Pt is aware and voiced his understanding. Nothing further is needed.

## 2018-11-23 ENCOUNTER — Telehealth: Payer: Self-pay | Admitting: Internal Medicine

## 2018-11-23 NOTE — Telephone Encounter (Signed)
Called and spoke to pt.  Pt stated that his wife has been exposed to covid and is quarantining. Pt is questioning if he should quarantine as well.  I have advised pt both and spouse to quarantine and go to community testing site.  I have provided pt with location and hours of operation for testing site.  Pt voiced his understanding. Nothing further is needed.

## 2018-11-29 ENCOUNTER — Observation Stay
Admission: EM | Admit: 2018-11-29 | Discharge: 2018-12-01 | Disposition: A | Discharge status: Home / Self Care | Payer: Medicare Other | Attending: Internal Medicine | Admitting: Internal Medicine

## 2018-11-29 ENCOUNTER — Other Ambulatory Visit: Payer: Self-pay

## 2018-11-29 DIAGNOSIS — I119 Hypertensive heart disease without heart failure: Secondary | ICD-10-CM | POA: Insufficient documentation

## 2018-11-29 DIAGNOSIS — Z79899 Other long term (current) drug therapy: Secondary | ICD-10-CM | POA: Insufficient documentation

## 2018-11-29 DIAGNOSIS — G4730 Sleep apnea, unspecified: Secondary | ICD-10-CM | POA: Insufficient documentation

## 2018-11-29 DIAGNOSIS — G2119 Other drug induced secondary parkinsonism: Secondary | ICD-10-CM | POA: Insufficient documentation

## 2018-11-29 DIAGNOSIS — E785 Hyperlipidemia, unspecified: Secondary | ICD-10-CM | POA: Insufficient documentation

## 2018-11-29 DIAGNOSIS — R42 Dizziness and giddiness: Principal | ICD-10-CM | POA: Insufficient documentation

## 2018-11-29 DIAGNOSIS — F319 Bipolar disorder, unspecified: Secondary | ICD-10-CM | POA: Insufficient documentation

## 2018-11-29 DIAGNOSIS — D500 Iron deficiency anemia secondary to blood loss (chronic): Secondary | ICD-10-CM | POA: Insufficient documentation

## 2018-11-29 DIAGNOSIS — E119 Type 2 diabetes mellitus without complications: Secondary | ICD-10-CM | POA: Diagnosis not present

## 2018-11-29 DIAGNOSIS — N400 Enlarged prostate without lower urinary tract symptoms: Secondary | ICD-10-CM | POA: Insufficient documentation

## 2018-11-29 DIAGNOSIS — I10 Essential (primary) hypertension: Secondary | ICD-10-CM | POA: Diagnosis not present

## 2018-11-29 DIAGNOSIS — J45909 Unspecified asthma, uncomplicated: Secondary | ICD-10-CM | POA: Insufficient documentation

## 2018-11-29 DIAGNOSIS — Z955 Presence of coronary angioplasty implant and graft: Secondary | ICD-10-CM | POA: Insufficient documentation

## 2018-11-29 DIAGNOSIS — M1990 Unspecified osteoarthritis, unspecified site: Secondary | ICD-10-CM | POA: Diagnosis not present

## 2018-11-29 DIAGNOSIS — I252 Old myocardial infarction: Secondary | ICD-10-CM | POA: Diagnosis not present

## 2018-11-29 DIAGNOSIS — K219 Gastro-esophageal reflux disease without esophagitis: Secondary | ICD-10-CM | POA: Insufficient documentation

## 2018-11-29 DIAGNOSIS — F429 Obsessive-compulsive disorder, unspecified: Secondary | ICD-10-CM | POA: Diagnosis not present

## 2018-11-29 DIAGNOSIS — R531 Weakness: Secondary | ICD-10-CM | POA: Diagnosis not present

## 2018-11-29 DIAGNOSIS — Z20828 Contact with and (suspected) exposure to other viral communicable diseases: Secondary | ICD-10-CM | POA: Diagnosis not present

## 2018-11-29 DIAGNOSIS — Z85828 Personal history of other malignant neoplasm of skin: Secondary | ICD-10-CM | POA: Diagnosis not present

## 2018-11-29 DIAGNOSIS — Z7982 Long term (current) use of aspirin: Secondary | ICD-10-CM | POA: Insufficient documentation

## 2018-11-29 DIAGNOSIS — Z7951 Long term (current) use of inhaled steroids: Secondary | ICD-10-CM | POA: Insufficient documentation

## 2018-11-29 DIAGNOSIS — Z66 Do not resuscitate: Secondary | ICD-10-CM | POA: Diagnosis not present

## 2018-11-29 DIAGNOSIS — I2510 Atherosclerotic heart disease of native coronary artery without angina pectoris: Secondary | ICD-10-CM | POA: Diagnosis not present

## 2018-11-29 DIAGNOSIS — R079 Chest pain, unspecified: Secondary | ICD-10-CM | POA: Diagnosis present

## 2018-11-29 DIAGNOSIS — Z8673 Personal history of transient ischemic attack (TIA), and cerebral infarction without residual deficits: Secondary | ICD-10-CM | POA: Insufficient documentation

## 2018-11-29 DIAGNOSIS — Z951 Presence of aortocoronary bypass graft: Secondary | ICD-10-CM | POA: Diagnosis not present

## 2018-11-29 LAB — CBC
HCT: 38.6 % — ABNORMAL LOW (ref 39.0–52.0)
Hemoglobin: 12.8 g/dL — ABNORMAL LOW (ref 13.0–17.0)
MCH: 29.3 pg (ref 26.0–34.0)
MCHC: 33.2 g/dL (ref 30.0–36.0)
MCV: 88.3 fL (ref 80.0–100.0)
Platelets: 203 10*3/uL (ref 150–400)
RBC: 4.37 MIL/uL (ref 4.22–5.81)
RDW: 13.7 % (ref 11.5–15.5)
WBC: 6.3 10*3/uL (ref 4.0–10.5)
nRBC: 0 % (ref 0.0–0.2)

## 2018-11-29 LAB — COMPREHENSIVE METABOLIC PANEL
ALT: 12 U/L (ref 0–44)
AST: 16 U/L (ref 15–41)
Albumin: 3.8 g/dL (ref 3.5–5.0)
Alkaline Phosphatase: 56 U/L (ref 38–126)
Anion gap: 9 (ref 5–15)
BUN: 15 mg/dL (ref 8–23)
CO2: 24 mmol/L (ref 22–32)
Calcium: 9.3 mg/dL (ref 8.9–10.3)
Chloride: 107 mmol/L (ref 98–111)
Creatinine, Ser: 0.79 mg/dL (ref 0.61–1.24)
GFR calc Af Amer: >60 mL/min (ref >60)
GFR calc non Af Amer: >60 mL/min (ref >60)
Glucose, Bld: 129 mg/dL — ABNORMAL HIGH (ref 70–99)
Potassium: 3.5 mmol/L (ref 3.5–5.1)
Sodium: 140 mmol/L (ref 135–145)
Total Bilirubin: 0.6 mg/dL (ref 0.3–1.2)
Total Protein: 6.4 g/dL — ABNORMAL LOW (ref 6.5–8.1)

## 2018-11-29 LAB — TROPONIN I (HIGH SENSITIVITY)
Troponin I (High Sensitivity): 64 ng/L — ABNORMAL HIGH (ref <18)
Troponin I (High Sensitivity): 72 ng/L — ABNORMAL HIGH (ref <18)

## 2018-11-29 LAB — GLUCOSE, CAPILLARY: Glucose-Capillary: 126 mg/dL — ABNORMAL HIGH (ref 70–99)

## 2018-11-29 MED ORDER — LISINOPRIL 10 MG PO TABS
20.0000 mg | ORAL_TABLET | Freq: Once | ORAL | Status: AC
  Administered 2018-11-29: 21:00:00 20 mg via ORAL
  Filled 2018-11-29: qty 2

## 2018-11-29 MED ORDER — AMLODIPINE BESYLATE 5 MG PO TABS
10.0000 mg | ORAL_TABLET | Freq: Once | ORAL | Status: AC
  Administered 2018-11-29: 21:00:00 10 mg via ORAL
  Filled 2018-11-29: qty 2

## 2018-11-29 MED ORDER — SODIUM CHLORIDE 0.9 % IV BOLUS: 1000.0000 mL | Freq: Once | INTRAVENOUS | Status: DC

## 2018-11-29 MED ORDER — CARVEDILOL 6.25 MG PO TABS
12.5000 mg | ORAL_TABLET | Freq: Once | ORAL | Status: AC
  Administered 2018-11-29: 21:00:00 12.5 mg via ORAL
  Filled 2018-11-29: qty 2

## 2018-11-29 NOTE — ED Notes (Signed)
Patient awaiting admission status.

## 2018-11-29 NOTE — Discharge Instructions (Signed)
Please follow-up with your doctor in the next 2 to 3 days for recheck/reevaluation.  Return to the emergency department for any worsening symptoms, or any other symptom personally concerning to yourself.  Please drink plenty of fluids and obtain plenty of rest.

## 2018-11-29 NOTE — ED Provider Notes (Signed)
[facility] Emergency Department Provider Note  Time seen: 6:22 PM  I have reviewed the triage vital signs and the nursing notes.   HISTORY  Chief Complaint Dizziness and Near Syncope   HPI Roger Pruitt is a 68 y.o. male with a past medical history of anemia, asthma, bipolar, CAD, gastric reflux, hypertension, hyperlipidemia, vertigo, presents to the emergency department for weakness and dizziness.  According to the patient he was in the emergency department visiting his wife when he became weak and felt dizzy.  Patient states this is happened before but he does not know why.  Patient does state intermittent chest pain on review of systems over the past several weeks.  Denies any shortness of breath cough or fever.  No vomiting or diarrhea.  No dysuria.  Currently patient states he feels better.   Past Medical History:  Diagnosis Date  . Abdominal pain   . Acute cystitis without hematuria   . Anemia   . Anginal pain ([place])    rarely  . Arthritis    knees and hands  . Asthma   . Bipolar disorder ([place])    BIPOLAR/ SCHIZO/ AFFECTIVE   . BPD (bronchopulmonary dysplasia)   . BPH (benign prostatic hyperplasia)   . CAD (coronary artery disease)    a. s/p MI 2001, 2012;  b. s/p prior LCX stenting; b. 05/2012 Abnl MV; c. 05/2012 Cath: patent LCX stent w/ otw mod non-obs dzs->Med Rx.  . Carpal tunnel syndrome    surgery both hands  . Depression   . Disc degeneration, lumbar   . Dyspnea    WITH EXERTION  . Elevated PSA   . Esophageal reflux   . GERD (gastroesophageal reflux disease)   . Hematuria    history of gross and microscopic  . Hemorrhoid   . History of balanitis   . History of bladder infections   . History of echocardiogram    a. 07/2014 Echo: EF 60-65%, no rwma, nl LA size, nl RV/PASP.  Marland Kitchen History of urethral stricture   . Hyperlipidemia   . Hypertension    controlled  . Hypertensive heart disease   . Incomplete bladder emptying   . Iron  deficiency anemia, unspecified   . Lower urinary tract symptoms   . Myocardial infarction ([place])    X7 LAST 2001  . Neuropathy   . Nocturia   . Obsessive compulsive disorder   . Other dysphagia   . Panic attack   . Parkinson's disease ([place])   . Sleep apnea    SURGICAL TX  . Stroke ([place])   . Type II diabetes mellitus ([place])   . Unspecified vitamin D deficiency   . Urethral stricture   . Urinary retention   . Varicose vein of leg   . Vertigo    hx of  . Vitamin D deficiency   . Wears dentures    upper and lowers    Patient Active Problem List   Diagnosis Date Noted  . Chest pain 09/05/2018  . Iron deficiency anemia due to chronic blood loss   . Benign neoplasm of ascending colon   . Benign neoplasm of cecum   . Gastritis without bleeding   . Parkinsonism due to drug ([place]) 07/22/2016  . Chronic bilateral low back pain without sciatica 04/07/2016  . Syncope 12/31/2015  . Status post coronary artery bypass grafting 12/31/2015  . S/P CABG x 4 12/25/2015  . Unstable angina ([place]) 12/01/2015  . Hypertensive heart disease   .  Type II diabetes mellitus ([place])   . Abnormal chest CT 07/10/2015  . Pulmonary nodules 07/09/2015  . Basal cell carcinoma of skin 05/27/2015  . Bipolar disorder ([place]) 05/27/2015  . CAD (coronary artery disease), native coronary artery 05/27/2015  . DDD (degenerative disc disease), lumbosacral 05/27/2015  . Esophageal reflux 05/27/2015  . Benign essential hypertension 05/27/2015  . Sleep apnea 05/27/2015  . Urethral stricture 05/27/2015  . Benign prostatic hypertrophy (BPH) with incomplete bladder emptying 11/29/2014  . CVA (cerebral infarction) 08/23/2014  . Hypertension   . S/P coronary artery stent placement 09/30/2011  . Hyperlipidemia 09/30/2011  . History of myocardial infarction 09/03/2011  . Seasonal allergies 09/03/2011    Past Surgical History:  Procedure Laterality Date  . BACK SURGERY     lumbar disc  . BACK SURGERY  1984  . CARDIAC  CATHETERIZATION  6/10;2011;Aug.2012;Oct. 2012;   . CARDIAC CATHETERIZATION  05/2012   [facility]; EF 60%, patent stent in the left circumflex, moderate three-vessel disease with no flow-limiting lesions. Unchanged from most recent catheterization  . CARDIAC CATHETERIZATION N/A 12/10/2015   Procedure: Left Heart Cath and Coronary Angiography;  Surgeon: Corey Skains, MD;  Location: [place] [facility];  Service: Cardiovascular;  Laterality: N/A;  . CARPAL TUNNEL RELEASE  2009   left  . CATARACT EXTRACTION W/PHACO Left 02/14/2017   Procedure: CATARACT EXTRACTION PHACO AND INTRAOCULAR LENS PLACEMENT (IOC);  Surgeon: Birder Robson, MD;  Location: [facility];  Service: Ophthalmology;  Laterality: Left;  [place] 00:42 AP% 16.6 CDE 7.14 Fluid pack lot # [id number]  . CATARACT EXTRACTION W/PHACO Right 02/28/2017   Procedure: CATARACT EXTRACTION PHACO AND INTRAOCULAR LENS PLACEMENT (IOC);  Surgeon: Birder Robson, MD;  Location: [facility];  Service: Ophthalmology;  Laterality: Right;  [place] 01:09 AP% 11.4 CDE 7.98 Fluid pack lot # [id number]  . COLONOSCOPY WITH PROPOFOL N/A 06/29/2017   Procedure: COLONOSCOPY WITH PROPOFOL;  Surgeon: [name], MD;  Location: [place];  Service: Endoscopy;  Laterality: N/A;  Diabetic - oral meds  . CORONARY ANGIOPLASTY  2011 & 2001   s/p stent  . CORONARY ARTERY BYPASS GRAFT     stent placement  . CORONARY ARTERY BYPASS GRAFT N/A 12/25/2015   Procedure: CORONARY ARTERY BYPASS GRAFTING (CABG) x4  , using left internal mammary artery and right leg greater saphenous vein harvested endoscopically LIMA-LAD SVG-OM2 SVG-DIAG SVG-PD;  Surgeon: Melrose Nakayama, MD;  Location: [place];  Service: Open Heart Surgery;  Laterality: N/A;  . [place]  . CYST REMOVAL LEG Right   . ESOPHAGOGASTRODUODENOSCOPY  06/29/2017   Procedure: ESOPHAGOGASTRODUODENOSCOPY (EGD);  Surgeon: [name], MD;  Location: [facility];  Service: Endoscopy;;  . POLYPECTOMY   06/29/2017   Procedure: POLYPECTOMY INTESTINAL;  Surgeon: [name], MD;  Location: [place];  Service: Endoscopy;;  . SKIN GRAFT    . TEE WITHOUT CARDIOVERSION N/A 12/25/2015   Procedure: TRANSESOPHAGEAL ECHOCARDIOGRAM (TEE);  Surgeon: Melrose Nakayama, MD;  Location: [facility];  Service: Open Heart Surgery;  Laterality: N/A;  . TONSILLECTOMY    . UVULOPALATOPHARYNGOPLASTY      Prior to Admission medications   Medication Sig Start Date End Date Taking? Authorizing Provider  acetaminophen (TYLENOL) 500 MG tablet Take 1,000 mg by mouth every 6 (six) hours as needed for moderate pain or headache.     [provider]  amLODipine (NORVASC) 10 MG tablet Take 10 mg by mouth daily. pm 10/06/16   [provider]  aspirin (ASPIRIN  81) 81 MG EC tablet Take 81 mg by mouth daily.     [provider]  atorvastatin (LIPITOR) 40 MG tablet Take 1 tablet (40 mg total) by mouth daily at 6 PM. 12/30/15   Barrett, Erin R, PA-C  budesonide-formoterol (SYMBICORT) 160-4.5 MCG/ACT inhaler Inhale 2 puffs into the lungs 2 (two) times daily. 11/21/18   Flora Lipps, MD  buPROPion (WELLBUTRIN XL) 300 MG 24 hr tablet Take 300 mg by mouth daily. noon    [provider]  carvedilol (COREG) 12.5 MG tablet Take 12.5 mg by mouth 2 (two) times daily. Am and pm 09/27/16   [provider]  ciprofloxacin (CIPRO) 500 MG tablet Take 1 tablet (500 mg total) by mouth every 12 (twelve) hours. 09/26/18   McGowan, Larene Beach A, PA-C  doxepin (SINEQUAN) 25 MG capsule Take 50 mg by mouth daily. pm 08/02/16   [provider]  fluticasone (FLONASE) 50 MCG/ACT nasal spray Place 2 sprays into both nostrils daily. Am and pm 11/21/18   Flora Lipps, MD  gabapentin (NEURONTIN) 300 MG capsule Take 300 mg by mouth 3 (three) times daily. Am, noon, pm 08/02/16   [provider]  Ipratropium-Albuterol (COMBIVENT RESPIMAT) 20-100 MCG/ACT AERS respimat INHALE ONE PUFF INTO THE LUNGS EVERY 6 HOURS  11/21/18   Flora Lipps, MD  isosorbide mononitrate (IMDUR) 30 MG 24 hr tablet Take 30 mg by mouth daily. 08/13/18   [provider]  JANUMET 50-1000 MG tablet Take 1 tablet by mouth 2 (two) times daily. Am and pm 08/02/16   [provider]  lisinopril (PRINIVIL,ZESTRIL) 20 MG tablet Take 20 mg by mouth 2 (two) times daily. Am and pm 08/02/16   [provider]  LORazepam (ATIVAN) 1 MG tablet Take 1-2 tablets (1-2 mg total) by mouth 2 (two) times daily. Pt takes one tablet in the morning and two at bedtime. Patient taking differently: Take 1-2 mg by mouth See admin instructions. Take 1 mg by mouth in the morning and take 2 mg by mouth at bedtime 01/04/16   Gladstone Lighter, MD  nitroGLYCERIN (NITROLINGUAL) 0.4 MG/SPRAY spray Place 1 spray under the tongue every 5 (five) minutes x 3 doses as needed for chest pain.    [provider]  pantoprazole (PROTONIX) 40 MG tablet Take 40 mg by mouth daily. am    [provider]  risperidone (RISPERDAL) 4 MG tablet Take 4 mg by mouth at bedtime.  08/02/16   [provider]  tamsulosin (FLOMAX) 0.4 MG CAPS capsule Take 1 capsule (0.4 mg total) by mouth daily. 09/26/18   Zara Council A, PA-C  topiramate (TOPAMAX) 100 MG tablet Take 100 mg by mouth daily. noon    [provider]  traMADol (ULTRAM) 50 MG tablet Take 1 tablet (50 mg total) by mouth every 8 (eight) hours as needed. 07/20/17   Coral Spikes, DO    Allergies  Allergen Reactions  . Morphine And Related Nausea And Vomiting  . Synalgos-Dc [Aspirin-Caff-Dihydrocodeine] Itching and Other (See Comments)    Reaction:  Stinging   . Synchlor A [Chlorpheniramine] Rash and Other (See Comments)    Patient doesn't recall this allergy.    Family History  Problem Relation Age of Onset  . Heart disease Mother   . Heart disease Brother   . Prostate cancer Father   . Heart disease Other   . Heart disease Brother   . Kidney disease Neg Hx   . Kidney  cancer Neg Hx   .  Bladder Cancer Neg Hx     Social History Social History   Tobacco Use  . Smoking status: Former Smoker    Packs/day: 0.50    Years: 15.00    Pack years: 7.50    Types: Cigarettes    Quit date: 05/18/1999    Years since quitting: 19.5  . Smokeless tobacco: Never Used  . Tobacco comment: quit 17 + years  Substance Use Topics  . Alcohol use: No    Alcohol/week: 0.0 standard drinks  . Drug use: No    Review of Systems Constitutional: Negative for fever. Cardiovascular: Intermittent chest pain none currently. Respiratory: Negative for shortness of breath. Gastrointestinal: Negative for abdominal pain, vomiting and diarrhea. Genitourinary: Negative for urinary compaints Musculoskeletal: Negative for musculoskeletal complaints Skin: Negative for skin complaints  Neurological: Negative for headache All other ROS negative  ____________________________________________   PHYSICAL EXAM:  VITAL SIGNS: ED Triage Vitals  Enc Vitals Group     BP 11/29/18 1802 (!) 176/101     Pulse Rate 11/29/18 1802 68     Resp 11/29/18 1802 18     Temp 11/29/18 1802 98.3 F (36.8 C)     Temp Source 11/29/18 1802 Oral     SpO2 11/29/18 1802 97 %     Weight 11/29/18 1804 220 lb (99.8 kg)     Height 11/29/18 1804 6\' 3"  (1.905 m)     Head Circumference --      Peak Flow --      Pain Score 11/29/18 1804 9     Pain Loc --      Pain Edu? --      Excl. in [place]? --    Constitutional: Alert and oriented. Well appearing and in no distress. Eyes: Normal exam ENT      Head: Normocephalic and atraumatic.      Mouth/Throat: Mucous membranes are moist. Cardiovascular: Normal rate, regular rhythm.  Respiratory: Normal respiratory effort without tachypnea nor retractions. Breath sounds are clear Gastrointestinal: Soft and nontender. No distention.  Musculoskeletal: Nontender with normal range of motion in all extremities.  Neurologic:  Normal speech and language. No gross focal  neurologic deficits Skin:  Skin is warm, dry and intact.  Psychiatric: Mood and affect are normal.   ____________________________________________    EKG  EKG viewed and interpreted by myself shows a normal sinus rhythm at 70 bpm with a slightly widened QRS, left axis deviation, prolonged PR interval, nonspecific ST changes.  ____________________________________________   INITIAL IMPRESSION / ASSESSMENT AND PLAN / ED COURSE  Pertinent labs & imaging results that were available during my care of the patient were reviewed by me and considered in my medical decision making (see chart for details).   Patient presents to the emergency department for near syncope/dizziness while visiting his wife in the emergency department.  Overall the patient appears well currently.  We will check labs including cardiac enzymes, IV hydrate and continue to closely monitor.  EKG does not appear to show any significant change from prior 09/05/2018.  Patient's lab work appears largely at baseline for the patient.  Troponin is elevated to 64 however patient is all troponins have ranged from .06-.08 which would be fairly equivalent.  Patient is somewhat hypertensive, we will dose medication.  We will repeat a troponin at 2 hours.  If the repeat troponin remains unchanged anticipate likely discharge home with PCP follow-up.  Patient agreeable.  Patient is feeling better in the emergency department.  Patient is feeling well.  Initial lab work largely [place] repeat troponin is pending.  Patient's blood pressure is significantly elevated patient is on several blood pressure medications I have dosed his home nighttime medications for him.  At the patient's repeat troponin is negative anticipate likely discharge home.  Patient agreeable.  Patient care signed out to Dr. Owens Shark.  Roger Pruitt was evaluated in Emergency Department on 11/29/2018 for the symptoms described in the history of present illness. He was evaluated  in the context of the global COVID-19 pandemic, which necessitated consideration that the patient might be at risk for infection with the SARS-CoV-2 virus that causes COVID-19. Institutional protocols and algorithms that pertain to the evaluation of patients at risk for COVID-19 are in a state of rapid change based on information released by regulatory bodies including the CDC and federal and state organizations. These policies and algorithms were followed during the patient's care in the ED.  ____________________________________________   FINAL CLINICAL IMPRESSION(S) / ED DIAGNOSES  Weakness   Harvest Dark, MD 11/29/18 2303

## 2018-11-29 NOTE — ED Triage Notes (Signed)
Patient in hospital visiting wife whom is a patient. Became dizzy and had a near syncope episode. Reports headache 9/10.

## 2018-11-29 NOTE — ED Notes (Signed)
Po meds given for hypertension/ headache. Labs drawn and sent

## 2018-11-30 ENCOUNTER — Other Ambulatory Visit: Payer: Self-pay

## 2018-11-30 DIAGNOSIS — R42 Dizziness and giddiness: Secondary | ICD-10-CM | POA: Diagnosis not present

## 2018-11-30 DIAGNOSIS — R0789 Other chest pain: Secondary | ICD-10-CM | POA: Diagnosis not present

## 2018-11-30 DIAGNOSIS — R079 Chest pain, unspecified: Secondary | ICD-10-CM | POA: Diagnosis not present

## 2018-11-30 DIAGNOSIS — I2510 Atherosclerotic heart disease of native coronary artery without angina pectoris: Secondary | ICD-10-CM | POA: Diagnosis not present

## 2018-11-30 DIAGNOSIS — I10 Essential (primary) hypertension: Secondary | ICD-10-CM | POA: Diagnosis not present

## 2018-11-30 DIAGNOSIS — E785 Hyperlipidemia, unspecified: Secondary | ICD-10-CM | POA: Diagnosis not present

## 2018-11-30 LAB — GLUCOSE, CAPILLARY
Glucose-Capillary: 124 mg/dL — ABNORMAL HIGH (ref 70–99)
Glucose-Capillary: 103 mg/dL — ABNORMAL HIGH (ref 70–99)
Glucose-Capillary: 100 mg/dL — ABNORMAL HIGH (ref 70–99)
Glucose-Capillary: 142 mg/dL — ABNORMAL HIGH (ref 70–99)

## 2018-11-30 LAB — TROPONIN I (HIGH SENSITIVITY)
Troponin I (High Sensitivity): 74 ng/L — ABNORMAL HIGH (ref <18)
Troponin I (High Sensitivity): 70 ng/L — ABNORMAL HIGH (ref <18)

## 2018-11-30 LAB — TSH: TSH: 1.191 u[IU]/mL (ref 0.350–4.500)

## 2018-11-30 LAB — SARS CORONAVIRUS 2 (TAT 6-24 HRS): SARS Coronavirus 2: NEGATIVE

## 2018-11-30 MED ORDER — HYDRALAZINE HCL 25 MG PO TABS
25.0000 mg | ORAL_TABLET | Freq: Three times a day (TID) | ORAL | Status: DC
  Administered 2018-11-30 – 2018-12-01 (×3): 25 mg via ORAL
  Filled 2018-11-30 (×3): qty 1

## 2018-11-30 MED ORDER — ONDANSETRON HCL 4 MG/2ML IJ SOLN: 4.0000 mg | Freq: Four times a day (QID) | INTRAMUSCULAR | Status: DC | PRN

## 2018-11-30 MED ORDER — TAMSULOSIN HCL 0.4 MG PO CAPS
0.4000 mg | ORAL_CAPSULE | Freq: Every day | ORAL | Status: DC
  Administered 2018-11-30 – 2018-12-01 (×2): 0.4 mg via ORAL
  Filled 2018-11-30 (×2): qty 1

## 2018-11-30 MED ORDER — ACETAMINOPHEN 325 MG PO TABS
650.0000 mg | ORAL_TABLET | Freq: Once | ORAL | Status: AC
  Administered 2018-11-30: 650 mg via ORAL
  Filled 2018-11-30: qty 2

## 2018-11-30 MED ORDER — INSULIN ASPART 100 UNIT/ML ~~LOC~~ SOLN: 0.0000 [IU] | Freq: Every day | SUBCUTANEOUS | Status: DC

## 2018-11-30 MED ORDER — ONDANSETRON HCL 4 MG PO TABS: 4.0000 mg | ORAL_TABLET | Freq: Four times a day (QID) | ORAL | Status: DC | PRN

## 2018-11-30 MED ORDER — HYDRALAZINE HCL 20 MG/ML IJ SOLN
10.0000 mg | Freq: Four times a day (QID) | INTRAMUSCULAR | Status: DC | PRN
  Administered 2018-11-30: 04:00:00 10 mg via INTRAVENOUS

## 2018-11-30 MED ORDER — ACETAMINOPHEN 650 MG RE SUPP: 650.0000 mg | Freq: Four times a day (QID) | RECTAL | Status: DC | PRN

## 2018-11-30 MED ORDER — ACETAMINOPHEN 325 MG PO TABS
650.0000 mg | ORAL_TABLET | Freq: Four times a day (QID) | ORAL | Status: DC | PRN
  Administered 2018-11-30 (×2): 650 mg via ORAL
  Filled 2018-11-30 (×2): qty 2

## 2018-11-30 MED ORDER — INSULIN ASPART 100 UNIT/ML ~~LOC~~ SOLN
0.0000 [IU] | Freq: Three times a day (TID) | SUBCUTANEOUS | Status: DC
  Administered 2018-11-30: 08:00:00 1 [IU] via SUBCUTANEOUS
  Administered 2018-12-01: 10:00:00 2 [IU] via SUBCUTANEOUS
  Filled 2018-11-30 (×2): qty 1

## 2018-11-30 MED ORDER — ENOXAPARIN SODIUM 40 MG/0.4ML ~~LOC~~ SOLN
40.0000 mg | SUBCUTANEOUS | Status: DC
  Administered 2018-11-30: 08:00:00 40 mg via SUBCUTANEOUS
  Filled 2018-11-30: qty 0.4

## 2018-11-30 MED ORDER — AMLODIPINE BESYLATE 10 MG PO TABS
10.0000 mg | ORAL_TABLET | Freq: Every day | ORAL | Status: DC
  Administered 2018-11-30 – 2018-12-01 (×2): 10 mg via ORAL
  Filled 2018-11-30 (×2): qty 1

## 2018-11-30 MED ORDER — LISINOPRIL 20 MG PO TABS
20.0000 mg | ORAL_TABLET | Freq: Two times a day (BID) | ORAL | Status: DC
  Administered 2018-11-30 – 2018-12-01 (×3): 20 mg via ORAL
  Filled 2018-11-30 (×3): qty 1

## 2018-11-30 MED ORDER — ISOSORBIDE MONONITRATE ER 30 MG PO TB24
30.0000 mg | ORAL_TABLET | Freq: Every day | ORAL | Status: DC
  Administered 2018-11-30 – 2018-12-01 (×2): 30 mg via ORAL
  Filled 2018-11-30 (×2): qty 1

## 2018-11-30 MED ORDER — NITROGLYCERIN 2 % TD OINT
0.5000 [in_us] | TOPICAL_OINTMENT | TRANSDERMAL | Status: AC
  Administered 2018-11-30: 04:00:00 0.5 [in_us] via TOPICAL

## 2018-11-30 MED ORDER — CARVEDILOL 12.5 MG PO TABS
12.5000 mg | ORAL_TABLET | Freq: Two times a day (BID) | ORAL | Status: DC
  Administered 2018-11-30 – 2018-12-01 (×3): 12.5 mg via ORAL
  Filled 2018-11-30 (×3): qty 1

## 2018-11-30 MED ORDER — HYDRALAZINE HCL 20 MG/ML IJ SOLN
INTRAMUSCULAR | Status: AC
  Filled 2018-11-30: qty 1

## 2018-11-30 MED ORDER — DOCUSATE SODIUM 100 MG PO CAPS
100.0000 mg | ORAL_CAPSULE | Freq: Two times a day (BID) | ORAL | Status: DC
  Administered 2018-11-30 – 2018-12-01 (×3): 100 mg via ORAL
  Filled 2018-11-30 (×3): qty 1

## 2018-11-30 MED ORDER — NITROGLYCERIN 0.4 MG SL SUBL
0.4000 mg | SUBLINGUAL_TABLET | SUBLINGUAL | Status: DC | PRN
  Filled 2018-11-30: qty 1

## 2018-11-30 MED ORDER — ALPRAZOLAM 0.5 MG PO TABS: 0.5000 mg | ORAL_TABLET | Freq: Two times a day (BID) | ORAL | Status: DC | PRN

## 2018-11-30 NOTE — Plan of Care (Signed)

## 2018-11-30 NOTE — H&P (Signed)
Roger Pruitt is an 68 y.o. male.   Chief Complaint: Chest pain HPI: The patient with extensive past medical history most significant for coronary artery disease status post MI and stent placement, hypertension and bipolar disorder presents with chest pain.  The patient was here with his wife who was admitted to the hospital.  His pain began during her ED evaluation.  The patient intermittently has episodes of chest pain but reports pain more severe than usual.  He admits to feeling weak and dizzy.  He denies nausea, vomiting or diaphoresis.  Due to ongoing chest pain and cardiac risk factors the emergency department staff, hospitalist service for admission.  Past Medical History:  Diagnosis Date  . Abdominal pain   . Acute cystitis without hematuria   . Anemia   . Anginal pain ([place])    rarely  . Arthritis    knees and hands  . Asthma   . Bipolar disorder ([place])    BIPOLAR/ SCHIZO/ AFFECTIVE   . BPD (bronchopulmonary dysplasia)   . BPH (benign prostatic hyperplasia)   . CAD (coronary artery disease)    a. s/p MI 2001, 2012;  b. s/p prior LCX stenting; b. 05/2012 Abnl MV; c. 05/2012 Cath: patent LCX stent w/ otw mod non-obs dzs->Med Rx.  . Carpal tunnel syndrome    surgery both hands  . Depression   . Disc degeneration, lumbar   . Dyspnea    WITH EXERTION  . Elevated PSA   . Esophageal reflux   . GERD (gastroesophageal reflux disease)   . Hematuria    history of gross and microscopic  . Hemorrhoid   . History of balanitis   . History of bladder infections   . History of echocardiogram    a. 07/2014 Echo: EF 60-65%, no rwma, nl LA size, nl RV/PASP.  Marland Kitchen History of urethral stricture   . Hyperlipidemia   . Hypertension    controlled  . Hypertensive heart disease   . Incomplete bladder emptying   . Iron deficiency anemia, unspecified   . Lower urinary tract symptoms   . Myocardial infarction ([place])    X7 LAST 2001  . Neuropathy   . Nocturia   . Obsessive compulsive disorder   .  Other dysphagia   . Panic attack   . Parkinson's disease ([place])   . Sleep apnea    SURGICAL TX  . Stroke ([place])   . Type II diabetes mellitus ([place])   . Unspecified vitamin D deficiency   . Urethral stricture   . Urinary retention   . Varicose vein of leg   . Vertigo    hx of  . Vitamin D deficiency   . Wears dentures    upper and lowers    Past Surgical History:  Procedure Laterality Date  . BACK SURGERY     lumbar disc  . BACK SURGERY  1984  . CARDIAC CATHETERIZATION  6/10;2011;Aug.2012;Oct. 2012;   . CARDIAC CATHETERIZATION  05/2012   [facility]; EF 60%, patent stent in the left circumflex, moderate three-vessel disease with no flow-limiting lesions. Unchanged from most recent catheterization  . CARDIAC CATHETERIZATION N/A 12/10/2015   Procedure: Left Heart Cath and Coronary Angiography;  Surgeon: Corey Skains, MD;  Location: [facility];  Service: Cardiovascular;  Laterality: N/A;  . CARPAL TUNNEL RELEASE  2009   left  . CATARACT EXTRACTION W/PHACO Left 02/14/2017   Procedure: CATARACT EXTRACTION PHACO AND INTRAOCULAR LENS PLACEMENT (IOC);  Surgeon: Birder Robson, MD;  Location:  [facility];  Service: Ophthalmology;  Laterality: Left;  [place] 00:42 AP% 16.6 CDE 7.14 Fluid pack lot # [id number]  . CATARACT EXTRACTION W/PHACO Right 02/28/2017   Procedure: CATARACT EXTRACTION PHACO AND INTRAOCULAR LENS PLACEMENT (IOC);  Surgeon: Birder Robson, MD;  Location: [facility];  Service: Ophthalmology;  Laterality: Right;  [place] 01:09 AP% 11.4 CDE 7.98 Fluid pack lot # [id number]  . COLONOSCOPY WITH PROPOFOL N/A 06/29/2017   Procedure: COLONOSCOPY WITH PROPOFOL;  Surgeon: [name], MD;  Location: [facility];  Service: Endoscopy;  Laterality: N/A;  Diabetic - oral meds  . CORONARY ANGIOPLASTY  2011 & 2001   s/p stent  . CORONARY ARTERY BYPASS GRAFT     stent placement  . CORONARY ARTERY BYPASS GRAFT N/A 12/25/2015   Procedure: CORONARY ARTERY BYPASS GRAFTING (CABG) x4  ,  using left internal mammary artery and right leg greater saphenous vein harvested endoscopically LIMA-LAD SVG-OM2 SVG-DIAG SVG-PD;  Surgeon: Melrose Nakayama, MD;  Location: [facility];  Service: Open Heart Surgery;  Laterality: N/A;  . [place]  . CYST REMOVAL LEG Right   . ESOPHAGOGASTRODUODENOSCOPY  06/29/2017   Procedure: ESOPHAGOGASTRODUODENOSCOPY (EGD);  Surgeon: [name], MD;  Location: [facility];  Service: Endoscopy;;  . POLYPECTOMY  06/29/2017   Procedure: POLYPECTOMY INTESTINAL;  Surgeon: [name], MD;  Location: [facility];  Service: Endoscopy;;  . SKIN GRAFT    . TEE WITHOUT CARDIOVERSION N/A 12/25/2015   Procedure: TRANSESOPHAGEAL ECHOCARDIOGRAM (TEE);  Surgeon: Melrose Nakayama, MD;  Location: [facility];  Service: Open Heart Surgery;  Laterality: N/A;  . TONSILLECTOMY    . UVULOPALATOPHARYNGOPLASTY      Family History  Problem Relation Age of Onset  . Heart disease Mother   . Heart disease Brother   . Prostate cancer Father   . Heart disease Other   . Heart disease Brother   . Kidney disease Neg Hx   . Kidney cancer Neg Hx   . Bladder Cancer Neg Hx    Social History:  reports that he quit smoking about 19 years ago. His smoking use included cigarettes. He has a 7.50 pack-year smoking history. He has never used smokeless tobacco. He reports that he does not drink alcohol or use drugs.  Allergies:  Allergies  Allergen Reactions  . Morphine And Related Nausea And Vomiting  . Synalgos-Dc [Aspirin-Caff-Dihydrocodeine] Itching and Other (See Comments)    Reaction:  Stinging   . Synchlor A [Chlorpheniramine] Rash and Other (See Comments)    Patient doesn't recall this allergy.    Medications Prior to Admission  Medication Sig Dispense Refill  . acetaminophen (TYLENOL) 500 MG tablet Take 1,000 mg by mouth every 6 (six) hours as needed for moderate pain or headache.     Marland Kitchen amLODipine (NORVASC) 10 MG tablet Take 10 mg by mouth daily. pm     . aspirin (ASPIRIN 81) 81 MG EC tablet Take 81 mg by mouth daily.     Marland Kitchen atorvastatin (LIPITOR) 40 MG tablet Take 1 tablet (40 mg total) by mouth daily at 6 PM. 30 tablet 3  . budesonide-formoterol (SYMBICORT) 160-4.5 MCG/ACT inhaler Inhale 2 puffs into the lungs 2 (two) times daily. 1 Inhaler 1  . buPROPion (WELLBUTRIN XL) 300 MG 24 hr tablet Take 300 mg by mouth daily. noon    . carvedilol (COREG) 12.5 MG tablet Take 12.5 mg by mouth 2 (two) times daily. Am and pm    . ciprofloxacin (CIPRO) 500 MG tablet  Take 1 tablet (500 mg total) by mouth every 12 (twelve) hours. 28 tablet 0  . doxepin (SINEQUAN) 25 MG capsule Take 50 mg by mouth daily. pm    . fluticasone (FLONASE) 50 MCG/ACT nasal spray Place 2 sprays into both nostrils daily. Am and pm 16 g 2  . gabapentin (NEURONTIN) 300 MG capsule Take 300 mg by mouth 3 (three) times daily. Am, noon, pm    . Ipratropium-Albuterol (COMBIVENT RESPIMAT) 20-100 MCG/ACT AERS respimat INHALE ONE PUFF INTO THE LUNGS EVERY 6 HOURS 4 g 5  . isosorbide mononitrate (IMDUR) 30 MG 24 hr tablet Take 30 mg by mouth daily.    Marland Kitchen JANUMET 50-1000 MG tablet Take 1 tablet by mouth 2 (two) times daily. Am and pm    . lisinopril (PRINIVIL,ZESTRIL) 20 MG tablet Take 20 mg by mouth 2 (two) times daily. Am and pm    . LORazepam (ATIVAN) 1 MG tablet Take 1-2 tablets (1-2 mg total) by mouth 2 (two) times daily. Pt takes one tablet in the morning and two at bedtime. (Patient taking differently: Take 1-2 mg by mouth See admin instructions. Take 1 mg by mouth in the morning and take 2 mg by mouth at bedtime) 30 tablet 0  . nitroGLYCERIN (NITROLINGUAL) 0.4 MG/SPRAY spray Place 1 spray under the tongue every 5 (five) minutes x 3 doses as needed for chest pain.    . pantoprazole (PROTONIX) 40 MG tablet Take 40 mg by mouth daily. am    . risperidone (RISPERDAL) 4 MG tablet Take 4 mg by mouth at bedtime.     . tamsulosin (FLOMAX) 0.4 MG CAPS capsule Take 1 capsule (0.4 mg total) by mouth  daily. 90 capsule 3  . topiramate (TOPAMAX) 100 MG tablet Take 100 mg by mouth daily. noon    . traMADol (ULTRAM) 50 MG tablet Take 1 tablet (50 mg total) by mouth every 8 (eight) hours as needed. 15 tablet 0    Results for orders placed or performed during the hospital encounter of 11/29/18 (from the past 48 hour(s))  Glucose, capillary     Status: Abnormal   Collection Time: 11/29/18  5:56 PM  Result Value Ref Range   Glucose-Capillary 126 (H) 70 - 99 mg/dL  CBC     Status: Abnormal   Collection Time: 11/29/18  6:08 PM  Result Value Ref Range   WBC 6.3 4.0 - 10.5 K/uL   RBC 4.37 4.22 - 5.81 MIL/uL   Hemoglobin 12.8 (L) 13.0 - 17.0 g/dL   HCT 38.6 (L) 39.0 - 52.0 %   MCV 88.3 80.0 - 100.0 fL   MCH 29.3 26.0 - 34.0 pg   MCHC 33.2 30.0 - 36.0 g/dL   RDW 13.7 11.5 - 15.5 %   Platelets 203 150 - 400 K/uL   nRBC 0.0 0.0 - 0.2 %    Comment: Performed at [facility], [address] [place], [place] [address]  Comprehensive metabolic panel     Status: Abnormal   Collection Time: 11/29/18  6:08 PM  Result Value Ref Range   Sodium 140 135 - 145 mmol/L   Potassium 3.5 3.5 - 5.1 mmol/L   Chloride 107 98 - 111 mmol/L   CO2 24 22 - 32 mmol/L   Glucose, Bld 129 (H) 70 - 99 mg/dL   BUN 15 8 - 23 mg/dL   Creatinine, Ser 0.79 0.61 - 1.24 mg/dL   Calcium 9.3 8.9 - 10.3 mg/dL   Total Protein 6.4 (L)  6.5 - 8.1 g/dL   Albumin 3.8 3.5 - 5.0 g/dL   AST 16 15 - 41 U/L   ALT 12 0 - 44 U/L   Alkaline Phosphatase 56 38 - 126 U/L   Total Bilirubin 0.6 0.3 - 1.2 mg/dL   GFR calc non Af Amer >60 >60 mL/min   GFR calc Af Amer >60 >60 mL/min   Anion gap 9 5 - 15    Comment: Performed at [facility], [place], [place] [address]  Troponin I (High Sensitivity)     Status: Abnormal   Collection Time: 11/29/18  6:08 PM  Result Value Ref Range   Troponin I (High Sensitivity) 64 (H) <18 ng/L    Comment: (NOTE) Elevated high sensitivity troponin I (hsTnI) values and  significant  changes across serial measurements may suggest ACS but many other  chronic and acute conditions are known to elevate hsTnI results.  Refer to the "Links" section for chest pain algorithms and additional  guidance. Performed at [facility], [place], [place] [address]   Troponin I (High Sensitivity)     Status: Abnormal   Collection Time: 11/29/18 10:50 PM  Result Value Ref Range   Troponin I (High Sensitivity) 72 (H) <18 ng/L    Comment: (NOTE) Elevated high sensitivity troponin I (hsTnI) values and significant  changes across serial measurements may suggest ACS but many other  chronic and acute conditions are known to elevate hsTnI results.  Refer to the "Links" section for chest pain algorithms and additional  guidance. Performed at [facility], [place], [place] [address]   Troponin I (High Sensitivity)     Status: Abnormal   Collection Time: 11/30/18  4:42 AM  Result Value Ref Range   Troponin I (High Sensitivity) 74 (H) <18 ng/L    Comment: (NOTE) Elevated high sensitivity troponin I (hsTnI) values and significant  changes across serial measurements may suggest ACS but many other  chronic and acute conditions are known to elevate hsTnI results.  Refer to the "Links" section for chest pain algorithms and additional  guidance. Performed at [facility], [address] [place], [place] [address]   TSH     Status: None   Collection Time: 11/30/18  4:42 AM  Result Value Ref Range   TSH 1.191 0.350 - 4.500 uIU/mL    Comment: Performed by a 3rd Generation assay with a functional sensitivity of <=0.01 uIU/mL. Performed at [facility], [place], [place] [address]   Troponin I (High Sensitivity)     Status: Abnormal   Collection Time: 11/30/18  6:44 AM  Result Value Ref Range   Troponin I (High Sensitivity) 70 (H) <18 ng/L    Comment: (NOTE) Elevated high sensitivity troponin  I (hsTnI) values and significant  changes across serial measurements may suggest ACS but many other  chronic and acute conditions are known to elevate hsTnI results.  Refer to the "Links" section for chest pain algorithms and additional  guidance. Performed at [facility], [address] [place], [place] [address]   Glucose, capillary     Status: Abnormal   Collection Time: 11/30/18  7:29 AM  Result Value Ref Range   Glucose-Capillary 124 (H) 70 - 99 mg/dL   Comment 1 Notify RN    Comment 2 Document in Chart    No results found.  Review of Systems  Constitutional: Negative for chills and fever.  HENT: Negative for sore throat and tinnitus.   Eyes: Negative for blurred vision and redness.  Respiratory: Negative for cough and shortness of breath.   Cardiovascular: Positive for chest pain. Negative for palpitations, orthopnea and PND.  Gastrointestinal: Negative for abdominal pain, diarrhea, nausea and vomiting.  Genitourinary: Negative for dysuria, frequency and urgency.  Musculoskeletal: Negative for joint pain and myalgias.  Skin: Negative for rash.       No lesions  Neurological: Negative for speech change, focal weakness and weakness.  Endo/Heme/Allergies: Does not bruise/bleed easily.       No temperature intolerance  Psychiatric/Behavioral: Negative for depression and suicidal ideas.    Blood pressure (!) 166/100, pulse 76, temperature 97.8 F (36.6 C), temperature source Oral, resp. rate 19, height 6\' 3"  (1.905 m), weight 93.9 kg, SpO2 98 %. Physical Exam  Vitals reviewed. Constitutional: He is oriented to person, place, and time. He appears well-developed and well-nourished. No distress.  HENT:  Head: Normocephalic and atraumatic.  Mouth/Throat: Oropharynx is clear and moist.  Eyes: Pupils are equal, round, and reactive to light. Conjunctivae and EOM are normal. No scleral icterus.  Neck: Normal range of motion. Neck supple. No JVD present. No tracheal  deviation present. No thyromegaly present.  Cardiovascular: Normal rate and regular rhythm. Exam reveals friction rub. Exam reveals no gallop.  No murmur heard. Respiratory: Effort normal and breath sounds normal. No respiratory distress.  GI: Soft. Bowel sounds are normal. He exhibits no distension. There is no abdominal tenderness.  Genitourinary:    Genitourinary Comments: Deferred   Musculoskeletal: Normal range of motion.        General: No edema.  Lymphadenopathy:    He has no cervical adenopathy.  Neurological: He is alert and oriented to person, place, and time. No cranial nerve deficit.  Skin: Skin is warm and dry. No rash noted. No erythema.  Psychiatric: He has a normal mood and affect. His behavior is normal. Judgment and thought content normal.     Assessment/Plan This is a 68 year old male admitted for chest pain. 1.  Chest pain: Atypical in duration and severity but the patient is known to have chronic chest pain in general.  Continue to follow cardiac biomarkers.  Monitor telemetry.  Consult cardiology. 2.  CAD: Continue aspirin and Plavix also continue Imdur 3.  Hypertension: Uncontrolled; apply Nitropaste.  Continue amlodipine, carvedilol and lisinopril.  Labetalol as needed. 4.  Hyperlipidemia: Continue statin therapy 5.  BPH: Continue Flomax 6.  Diabetes mellitus type 2: Hold oral hypoglycemic agents.  Sliding scale insulin 7.  DVT prophylaxis: Lovenox 8.  GI prophylaxis: Pantoprazole The patient is a DNR.  Time spent on admission orders and patient care approximately 45 minutes  Harrie Foreman, MD 11/30/2018, 9:18 AM

## 2018-11-30 NOTE — ED Notes (Signed)
ED TO INPATIENT HANDOFF REPORT  ED Nurse Name and Phone #:  [facility]  S Name/Age/Gender Roger Pruitt 68 y.o. male Room/Bed: ED26A/ED26A  Code Status   Code Status: Prior  Home/SNF/Other Home Patient oriented to: self, place, time and situation Is this baseline? Yes   Triage Complete: Triage complete  Chief Complaint Near Syncope Weakness  Triage Note Patient in hospital visiting wife whom is a patient. Became dizzy and had a near syncope episode. Reports headache 9/10.    Allergies Allergies  Allergen Reactions  . Morphine And Related Nausea And Vomiting  . Synalgos-Dc [Aspirin-Caff-Dihydrocodeine] Itching and Other (See Comments)    Reaction:  Stinging   . Synchlor A [Chlorpheniramine] Rash and Other (See Comments)    Patient doesn't recall this allergy.    Level of Care/Admitting Diagnosis ED Disposition    ED Disposition Condition [facility] Area: [place] [[id number]]  Level of Care: Telemetry [5]  Covid Evaluation: Asymptomatic Screening Protocol (No Symptoms)  Diagnosis: Chest pain [id number]  Admitting Physician: Harrie Foreman [id number]  Attending Physician: Harrie Foreman [phone]  PT Class (Do Not Modify): Observation [104]  PT Acc Code (Do Not Modify): Observation [10022]       B Medical/Surgery History Past Medical History:  Diagnosis Date  . Abdominal pain   . Acute cystitis without hematuria   . Anemia   . Anginal pain ([place])    rarely  . Arthritis    knees and hands  . Asthma   . Bipolar disorder ([place])    BIPOLAR/ SCHIZO/ AFFECTIVE   . BPD (bronchopulmonary dysplasia)   . BPH (benign prostatic hyperplasia)   . CAD (coronary artery disease)    a. s/p MI 2001, 2012;  b. s/p prior LCX stenting; b. 05/2012 Abnl MV; c. 05/2012 Cath: patent LCX stent w/ otw mod non-obs dzs->Med Rx.  . Carpal tunnel syndrome    surgery both hands  . Depression   . Disc degeneration, lumbar   . Dyspnea    WITH  EXERTION  . Elevated PSA   . Esophageal reflux   . GERD (gastroesophageal reflux disease)   . Hematuria    history of gross and microscopic  . Hemorrhoid   . History of balanitis   . History of bladder infections   . History of echocardiogram    a. 07/2014 Echo: EF 60-65%, no rwma, nl LA size, nl RV/PASP.  Marland Kitchen History of urethral stricture   . Hyperlipidemia   . Hypertension    controlled  . Hypertensive heart disease   . Incomplete bladder emptying   . Iron deficiency anemia, unspecified   . Lower urinary tract symptoms   . Myocardial infarction ([place])    X7 LAST 2001  . Neuropathy   . Nocturia   . Obsessive compulsive disorder   . Other dysphagia   . Panic attack   . Parkinson's disease ([place])   . Sleep apnea    SURGICAL TX  . Stroke ([place])   . Type II diabetes mellitus ([place])   . Unspecified vitamin D deficiency   . Urethral stricture   . Urinary retention   . Varicose vein of leg   . Vertigo    hx of  . Vitamin D deficiency   . Wears dentures    upper and lowers   Past Surgical History:  Procedure Laterality Date  . BACK SURGERY     lumbar disc  . BACK SURGERY  1984  .  CARDIAC CATHETERIZATION  6/10;2011;Aug.2012;Oct. 2012;   . CARDIAC CATHETERIZATION  05/2012   [facility]; EF 60%, patent stent in the left circumflex, moderate three-vessel disease with no flow-limiting lesions. Unchanged from most recent catheterization  . CARDIAC CATHETERIZATION N/A 12/10/2015   Procedure: Left Heart Cath and Coronary Angiography;  Surgeon: Corey Skains, MD;  Location: [facility];  Service: Cardiovascular;  Laterality: N/A;  . CARPAL TUNNEL RELEASE  2009   left  . CATARACT EXTRACTION W/PHACO Left 02/14/2017   Procedure: CATARACT EXTRACTION PHACO AND INTRAOCULAR LENS PLACEMENT (IOC);  Surgeon: Birder Robson, MD;  Location: [facility];  Service: Ophthalmology;  Laterality: Left;  [place] 00:42 AP% 16.6 CDE 7.14 Fluid pack lot # [id number]  . CATARACT EXTRACTION W/PHACO Right  02/28/2017   Procedure: CATARACT EXTRACTION PHACO AND INTRAOCULAR LENS PLACEMENT (IOC);  Surgeon: Birder Robson, MD;  Location: [facility];  Service: Ophthalmology;  Laterality: Right;  [place] 01:09 AP% 11.4 CDE 7.98 Fluid pack lot # [id number]  . COLONOSCOPY WITH PROPOFOL N/A 06/29/2017   Procedure: COLONOSCOPY WITH PROPOFOL;  Surgeon: [name], MD;  Location: [facility];  Service: Endoscopy;  Laterality: N/A;  Diabetic - oral meds  . CORONARY ANGIOPLASTY  2011 & 2001   s/p stent  . CORONARY ARTERY BYPASS GRAFT     stent placement  . CORONARY ARTERY BYPASS GRAFT N/A 12/25/2015   Procedure: CORONARY ARTERY BYPASS GRAFTING (CABG) x4  , using left internal mammary artery and right leg greater saphenous vein harvested endoscopically LIMA-LAD SVG-OM2 SVG-DIAG SVG-PD;  Surgeon: Melrose Nakayama, MD;  Location: [facility];  Service: Open Heart Surgery;  Laterality: N/A;  . [place]  . CYST REMOVAL LEG Right   . ESOPHAGOGASTRODUODENOSCOPY  06/29/2017   Procedure: ESOPHAGOGASTRODUODENOSCOPY (EGD);  Surgeon: [name], MD;  Location: [facility];  Service: Endoscopy;;  . POLYPECTOMY  06/29/2017   Procedure: POLYPECTOMY INTESTINAL;  Surgeon: [name], MD;  Location: [facility];  Service: Endoscopy;;  . SKIN GRAFT    . TEE WITHOUT CARDIOVERSION N/A 12/25/2015   Procedure: TRANSESOPHAGEAL ECHOCARDIOGRAM (TEE);  Surgeon: Melrose Nakayama, MD;  Location: [facility];  Service: Open Heart Surgery;  Laterality: N/A;  . TONSILLECTOMY    . UVULOPALATOPHARYNGOPLASTY       A IV Location/Drains/Wounds Patient Lines/Drains/Airways Status   Active Line/Drains/Airways    Name:   Placement date:   Placement time:   Site:   Days:   Peripheral IV 09/05/18 Right Antecubital   09/05/18    0447    Antecubital   86   Peripheral IV 11/29/18 Left Forearm   11/29/18    1809    Forearm   1   Incision (Closed) 02/14/17 Eye Left   02/14/17    0757     654   Incision (Closed) 02/28/17  Eye Right   02/28/17    0925     640   Incision (Closed) 06/29/17 Rectum   06/29/17    0810     519   Incision (Closed) 06/29/17 Throat   06/29/17    0810     519          Intake/Output Last 24 hours  Intake/Output Summary (Last 24 hours) at 11/30/2018 0349 Last data filed at 11/29/2018 2251 Gross per 24 hour  Intake -  Output 1725 ml  Net -1725 ml    Labs/Imaging Results for orders placed or performed during the hospital encounter of 11/29/18 (from the past 48 hour(s))  Glucose, capillary     Status: Abnormal   Collection Time: 11/29/18  5:56 PM  Result Value Ref Range   Glucose-Capillary 126 (H) 70 - 99 mg/dL  CBC     Status: Abnormal   Collection Time: 11/29/18  6:08 PM  Result Value Ref Range   WBC 6.3 4.0 - 10.5 K/uL   RBC 4.37 4.22 - 5.81 MIL/uL   Hemoglobin 12.8 (L) 13.0 - 17.0 g/dL   HCT 38.6 (L) 39.0 - 52.0 %   MCV 88.3 80.0 - 100.0 fL   MCH 29.3 26.0 - 34.0 pg   MCHC 33.2 30.0 - 36.0 g/dL   RDW 13.7 11.5 - 15.5 %   Platelets 203 150 - 400 K/uL   nRBC 0.0 0.0 - 0.2 %    Comment: Performed at [facility], [address] [place], [place] [address]  Comprehensive metabolic panel     Status: Abnormal   Collection Time: 11/29/18  6:08 PM  Result Value Ref Range   Sodium 140 135 - 145 mmol/L   Potassium 3.5 3.5 - 5.1 mmol/L   Chloride 107 98 - 111 mmol/L   CO2 24 22 - 32 mmol/L   Glucose, Bld 129 (H) 70 - 99 mg/dL   BUN 15 8 - 23 mg/dL   Creatinine, Ser 0.79 0.61 - 1.24 mg/dL   Calcium 9.3 8.9 - 10.3 mg/dL   Total Protein 6.4 (L) 6.5 - 8.1 g/dL   Albumin 3.8 3.5 - 5.0 g/dL   AST 16 15 - 41 U/L   ALT 12 0 - 44 U/L   Alkaline Phosphatase 56 38 - 126 U/L   Total Bilirubin 0.6 0.3 - 1.2 mg/dL   GFR calc non Af Amer >60 >60 mL/min   GFR calc Af Amer >60 >60 mL/min   Anion gap 9 5 - 15    Comment: Performed at [facility], [place], [place] [address]  Troponin I (High Sensitivity)     Status: Abnormal   Collection Time:  11/29/18  6:08 PM  Result Value Ref Range   Troponin I (High Sensitivity) 64 (H) <18 ng/L    Comment: (NOTE) Elevated high sensitivity troponin I (hsTnI) values and significant  changes across serial measurements may suggest ACS but many other  chronic and acute conditions are known to elevate hsTnI results.  Refer to the "Links" section for chest pain algorithms and additional  guidance. Performed at [facility], [place], [place] [address]   Troponin I (High Sensitivity)     Status: Abnormal   Collection Time: 11/29/18 10:50 PM  Result Value Ref Range   Troponin I (High Sensitivity) 72 (H) <18 ng/L    Comment: (NOTE) Elevated high sensitivity troponin I (hsTnI) values and significant  changes across serial measurements may suggest ACS but many other  chronic and acute conditions are known to elevate hsTnI results.  Refer to the "Links" section for chest pain algorithms and additional  guidance. Performed at [facility], [address] [place], [place] [address]    No results found.  Pending Labs Unresulted Labs (From admission, onward)    Start     Ordered   11/30/18 0107  SARS CORONAVIRUS 2 (TAT 6-24 HRS) Nasopharyngeal Nasopharyngeal Swab  (Asymptomatic/Tier 2 Patients Labs)  ONCE - STAT,   STAT    Question Answer Comment  Is this test for diagnosis or screening Screening   Symptomatic for COVID-19 as defined by CDC  No   Hospitalized for COVID-19 No   Admitted to ICU for COVID-19 No   Previously tested for COVID-19 No   Resident in a congregate (group) care setting No   Employed in healthcare setting No      11/30/18 0106   Signed and Held  Creatinine, serum  (enoxaparin (LOVENOX)    CrCl >/= 30 ml/min)  Weekly,   R    Comments: while on enoxaparin therapy    Signed and Held   Signed and Held  TSH  Add-on,   R     Signed and Held          Vitals/Pain Today's Vitals   11/30/18 0210 11/30/18 0223 11/30/18 0302 11/30/18  0330  BP:  (!) 198/120 (!) 180/81 (!) 172/105  Pulse:  74 63 71  Resp:  18 20 17   Temp:      TempSrc:      SpO2:  99% 99% 98%  Weight:      Height:      PainSc: 6  5       Isolation Precautions No active isolations  Medications Medications  sodium chloride 0.9 % bolus 1,000 mL (1,000 mLs Intravenous Not Given 11/29/18 1808)  amLODipine (NORVASC) tablet 10 mg (10 mg Oral Given 11/29/18 2115)  carvedilol (COREG) tablet 12.5 mg (12.5 mg Oral Given 11/29/18 2114)  lisinopril (ZESTRIL) tablet 20 mg (20 mg Oral Given 11/29/18 2115)  acetaminophen (TYLENOL) tablet 650 mg (650 mg Oral Given 11/30/18 0130)  walks with device  Mobility  Moderate fall risk   Focused Assessments n/a   R Recommendations: See Admitting Provider Note  Report given to:   Additional Notes: n/a

## 2018-11-30 NOTE — Progress Notes (Signed)
Ch visited with pt upon request of the pt's wife who is also hospitalized. Wife wanted ch to discuss her decline in her health and that she desires to be on hospice care. The pt is not in a place of acceptance but is aware that the care that is needed for his wife is becoming overwhelming for him. Pt shared that he been hypertensive for years and has had a quadruple bypass surgery as reported by the wife years ago. Ch wanted to discuss the well-being of the pt who shared that he became dizzy while in the ER with his wife. Ch wanted to f/u with the pt's wife but allowed time for the pt to have a consultation with pall care.  Ch receive update from pall care regarding the pt and his wife and the limitations that may impinge on the EOL that the pt and his wife desires.    11/30/18 1200  Clinical Encounter Type  Visited With Patient;Health care provider;Patient and family together  Visit Type Psychological support;Spiritual support;Social support  Referral From Family  Consult/Referral To Chaplain  Spiritual Encounters  Spiritual Needs Emotional;Grief support  Stress Factors  Patient Stress Factors Exhausted;Health changes;Loss;Loss of control;Major life changes  Family Stress Factors Loss of control;Health changes

## 2018-11-30 NOTE — Progress Notes (Signed)
Escorted pt via w/c to ICU to visit with his wife.

## 2018-11-30 NOTE — Progress Notes (Signed)
Admitted this morning for near syncope, elevated blood pressure.  Patient was visiting his wife who is in ICU then he developed a near syncope and admitted to telemetry on admission found to have severely elevated blood pressure.  Seen in the room again, no chest pain, no shortness of breath, denies any complaints.  Chest pain atypical likely due to stress.  Patient troponins are around 74 and 70. Cardiology consult placed.   2.  Malignant hypertension, patient on amlodipine Coreg, lisinopril, had hydralazine. 3.  Hyperlipidemia: Continue statins 4.  CAD, patient on aspirin, Plavix, Imdur. DNR.

## 2018-11-30 NOTE — ED Notes (Signed)
Nitro and hydralazine given. Sharyn Lull RN aware.

## 2018-12-01 DIAGNOSIS — R079 Chest pain, unspecified: Secondary | ICD-10-CM | POA: Diagnosis not present

## 2018-12-01 DIAGNOSIS — I2510 Atherosclerotic heart disease of native coronary artery without angina pectoris: Secondary | ICD-10-CM | POA: Diagnosis not present

## 2018-12-01 DIAGNOSIS — I10 Essential (primary) hypertension: Secondary | ICD-10-CM | POA: Diagnosis not present

## 2018-12-01 DIAGNOSIS — E785 Hyperlipidemia, unspecified: Secondary | ICD-10-CM | POA: Diagnosis not present

## 2018-12-01 DIAGNOSIS — R42 Dizziness and giddiness: Secondary | ICD-10-CM | POA: Diagnosis not present

## 2018-12-01 LAB — GLUCOSE, CAPILLARY: Glucose-Capillary: 151 mg/dL — ABNORMAL HIGH (ref 70–99)

## 2018-12-01 MED ORDER — HYDRALAZINE HCL 25 MG PO TABS: 25.0000 mg | ORAL_TABLET | Freq: Three times a day (TID) | ORAL | 0 refills | Status: DC

## 2018-12-01 NOTE — Progress Notes (Signed)
Discharge instructions explained to pt/ verbalized an understanding/ iv and tele  Removed/ RX given to pt/ pt transported to wife's room ( 103) via wheelchair/ pt given permission by MD to drive self home.

## 2018-12-01 NOTE — Care Management Obs Status (Signed)
[facility] NOTIFICATION   Patient Details  Name: Iniko Derossett MRN: [id number] Date of Birth: 01/11/1951   Medicare Observation Status Notification Given:  Yes    Malick Netz A Avelino Herren, RN 12/01/2018, 9:39 AM

## 2018-12-01 NOTE — Progress Notes (Signed)
Patient is stable for discharge.  Patient's wife is on comfort care measures, now in 1C.  Discharge home today, spoke with palliative care nurse yesterday, patient would like to visit wife, discharge from hospital as soon as possible.

## 2018-12-03 NOTE — Discharge Summary (Signed)
Roger Pruitt, is a 68 y.o. male  DOB 18-Jul-1950  MRN [id number].  Admission date:  11/29/2018  Admitting Physician  Harrie Foreman, MD  Discharge Date:  12/01/2018   Primary MD  Casilda Carls, MD  Recommendations for primary care physician for things to follow:   Follow-up with PCP in 1 week   Admission Diagnosis  Dizziness [R42] Weakness [R53.1]   Discharge Diagnosis  Dizziness [R42] Weakness [R53.1]    Active Problems:   Chest pain      Past Medical History:  Diagnosis Date  . Abdominal pain   . Acute cystitis without hematuria   . Anemia   . Anginal pain ([place])    rarely  . Arthritis    knees and hands  . Asthma   . Bipolar disorder ([place])    BIPOLAR/ SCHIZO/ AFFECTIVE   . BPD (bronchopulmonary dysplasia)   . BPH (benign prostatic hyperplasia)   . CAD (coronary artery disease)    a. s/p MI 2001, 2012;  b. s/p prior LCX stenting; b. 05/2012 Abnl MV; c. 05/2012 Cath: patent LCX stent w/ otw mod non-obs dzs->Med Rx.  . Carpal tunnel syndrome    surgery both hands  . Depression   . Disc degeneration, lumbar   . Dyspnea    WITH EXERTION  . Elevated PSA   . Esophageal reflux   . GERD (gastroesophageal reflux disease)   . Hematuria    history of gross and microscopic  . Hemorrhoid   . History of balanitis   . History of bladder infections   . History of echocardiogram    a. 07/2014 Echo: EF 60-65%, no rwma, nl LA size, nl RV/PASP.  Marland Kitchen History of urethral stricture   . Hyperlipidemia   . Hypertension    controlled  . Hypertensive heart disease   . Incomplete bladder emptying   . Iron deficiency anemia, unspecified   . Lower urinary tract symptoms   . Myocardial infarction ([place])    X7 LAST 2001  . Neuropathy   . Nocturia   . Obsessive compulsive disorder   . Other dysphagia   . Panic attack    . Parkinson's disease ([place])   . Sleep apnea    SURGICAL TX  . Stroke ([place])   . Type II diabetes mellitus ([place])   . Unspecified vitamin D deficiency   . Urethral stricture   . Urinary retention   . Varicose vein of leg   . Vertigo    hx of  . Vitamin D deficiency   . Wears dentures    upper and lowers    Past Surgical History:  Procedure Laterality Date  . BACK SURGERY     lumbar disc  . BACK SURGERY  1984  . CARDIAC CATHETERIZATION  6/10;2011;Aug.2012;Oct. 2012;   . CARDIAC CATHETERIZATION  05/2012   [facility]; EF 60%, patent stent in the left circumflex, moderate three-vessel disease with no flow-limiting lesions. Unchanged from most recent catheterization  . CARDIAC CATHETERIZATION N/A 12/10/2015   Procedure: Left Heart Cath and Coronary Angiography;  Surgeon: Corey Skains, MD;  Location: [facility];  Service: Cardiovascular;  Laterality: N/A;  . CARPAL TUNNEL RELEASE  2009   left  . CATARACT EXTRACTION W/PHACO Left 02/14/2017   Procedure: CATARACT EXTRACTION PHACO AND INTRAOCULAR LENS PLACEMENT (IOC);  Surgeon: Birder Robson, MD;  Location: [facility];  Service: Ophthalmology;  Laterality: Left;  [place] 00:42 AP% 16.6 CDE 7.14 Fluid pack lot # [id number]  .  CATARACT EXTRACTION W/PHACO Right 02/28/2017   Procedure: CATARACT EXTRACTION PHACO AND INTRAOCULAR LENS PLACEMENT ([place]);  Surgeon: Birder Robson, MD;  Location: [facility];  Service: Ophthalmology;  Laterality: Right;  [place] 01:09 AP% 11.4 CDE 7.98 Fluid pack lot # [id number]  . COLONOSCOPY WITH PROPOFOL N/A 06/29/2017   Procedure: COLONOSCOPY WITH PROPOFOL;  Surgeon: [name], MD;  Location: [facility];  Service: Endoscopy;  Laterality: N/A;  Diabetic - oral meds  . CORONARY ANGIOPLASTY  2011 & 2001   s/p stent  . CORONARY ARTERY BYPASS GRAFT     stent placement  . CORONARY ARTERY BYPASS GRAFT N/A 12/25/2015   Procedure: CORONARY ARTERY BYPASS GRAFTING (CABG) x4  , using left internal mammary artery and  right leg greater saphenous vein harvested endoscopically LIMA-LAD SVG-OM2 SVG-DIAG SVG-PD;  Surgeon: Melrose Nakayama, MD;  Location: [place];  Service: Open Heart Surgery;  Laterality: N/A;  . [place]  . CYST REMOVAL LEG Right   . ESOPHAGOGASTRODUODENOSCOPY  06/29/2017   Procedure: ESOPHAGOGASTRODUODENOSCOPY (EGD);  Surgeon: [name], MD;  Location: [facility];  Service: Endoscopy;;  . POLYPECTOMY  06/29/2017   Procedure: POLYPECTOMY INTESTINAL;  Surgeon: [name], MD;  Location: [facility];  Service: Endoscopy;;  . SKIN GRAFT    . TEE WITHOUT CARDIOVERSION N/A 12/25/2015   Procedure: TRANSESOPHAGEAL ECHOCARDIOGRAM (TEE);  Surgeon: Melrose Nakayama, MD;  Location: [facility];  Service: Open Heart Surgery;  Laterality: N/A;  . TONSILLECTOMY    . UVULOPALATOPHARYNGOPLASTY         History of present illness and  Hospital Course:     Kindly see H&P for history of present illness and admission details, please review complete Labs, Consult reports and Test reports for all details in brief  HPI  from the history and physical done on the day of admission 68 year old male patient admitted because of syncopal episode.  Patient was visiting his wife who was in ICU at [facility] when he had near syncopal episode associated with severely elevated blood pressure, admitted to telemetry.   Hospital Course  Near syncope, likely secondary to severely elevated blood pressure,  Mated to telemetry did not have any arrhythmias, patient lab work within normal range. 2.  Malignant hypertension, improved with amlodipine, Coreg, lisinopril is on his home medication, we added hydralazine 25 mg p.o. 3 times daily also for better BP control. 3.  CAD: Patient on aspirin, Plavix, Imdur. 4.  Atypical chest pain, patient is under a lot of stress, patient high-sensitivity troponins were around 70. Patient wanted to go home as his wife started on comfort measures and moved to one  stay in the hospital and he was eager to be discharged.  Patient requested Xanax in the hospital but did not find any Xanax prescription.    Discharge Condition: Stable   Follow UP  Follow-up Information    Casilda Carls, MD. Schedule an appointment as soon as possible for a visit in 2 days.   Specialty: Internal Medicine Contact information: [address] [address] [phone]        Casilda Carls, MD. Schedule an appointment as soon as possible for a visit in 1 week(s).   Specialty: Internal Medicine Contact information: [address] [place] [address] [phone]             Discharge Instructions  and  Discharge Medications      Allergies as of 12/01/2018      Reactions  Morphine And Related Nausea And Vomiting   Synalgos-dc [aspirin-caff-dihydrocodeine] Itching, Other (See Comments)   Reaction:  Stinging    Synchlor A [chlorpheniramine] Rash, Other (See Comments)   Patient doesn't recall this allergy.      Medication List    STOP taking these medications   ciprofloxacin 500 MG tablet Commonly known as: CIPRO     TAKE these medications   acetaminophen 500 MG tablet Commonly known as: TYLENOL Take 1,000 mg by mouth every 6 (six) hours as needed for moderate pain or headache.   amLODipine 10 MG tablet Commonly known as: NORVASC Take 10 mg by mouth daily. pm   Aspirin 81 81 MG EC tablet Generic drug: aspirin Take 81 mg by mouth daily.   atorvastatin 40 MG tablet Commonly known as: LIPITOR Take 1 tablet (40 mg total) by mouth daily at 6 PM.   budesonide-formoterol 160-4.5 MCG/ACT inhaler Commonly known as: Symbicort Inhale 2 puffs into the lungs 2 (two) times daily.   buPROPion 300 MG 24 hr tablet Commonly known as: WELLBUTRIN XL Take 300 mg by mouth daily. noon   carvedilol 12.5 MG tablet Commonly known as: COREG Take 12.5 mg by mouth 2 (two) times daily.   Combivent Respimat 20-100 MCG/ACT Aers  respimat Generic drug: Ipratropium-Albuterol INHALE ONE PUFF INTO THE LUNGS EVERY 6 HOURS What changed:   how much to take  how to take this  when to take this  additional instructions   doxepin 25 MG capsule Commonly known as: SINEQUAN Take 50 mg by mouth at bedtime.   fluticasone 50 MCG/ACT nasal spray Commonly known as: FLONASE Place 2 sprays into both nostrils daily. Am and pm   gabapentin 300 MG capsule Commonly known as: NEURONTIN Take 300 mg by mouth 3 (three) times daily.   hydrALAZINE 25 MG tablet Commonly known as: APRESOLINE Take 1 tablet (25 mg total) by mouth every 8 (eight) hours.   isosorbide mononitrate 30 MG 24 hr tablet Commonly known as: IMDUR Take 30 mg by mouth daily.   Janumet 50-1000 MG tablet Generic drug: sitaGLIPtin-metformin Take 1 tablet by mouth 2 (two) times daily. Am and pm   lisinopril 20 MG tablet Commonly known as: ZESTRIL Take 20 mg by mouth 2 (two) times daily.   LORazepam 1 MG tablet Commonly known as: ATIVAN Take 1-2 tablets (1-2 mg total) by mouth 2 (two) times daily. Pt takes one tablet in the morning and two at bedtime. What changed:   when to take this  additional instructions   nitroGLYCERIN 0.4 MG/SPRAY spray Commonly known as: NITROLINGUAL Place 1 spray under the tongue every 5 (five) minutes x 3 doses as needed for chest pain.   pantoprazole 40 MG tablet Commonly known as: PROTONIX Take 40 mg by mouth daily.   risperidone 4 MG tablet Commonly known as: RISPERDAL Take 4 mg by mouth at bedtime.   tamsulosin 0.4 MG Caps capsule Commonly known as: FLOMAX Take 1 capsule (0.4 mg total) by mouth daily.   topiramate 100 MG tablet Commonly known as: TOPAMAX Take 100 mg by mouth daily at 12 noon.   traMADol 50 MG tablet Commonly known as: ULTRAM Take 1 tablet (50 mg total) by mouth every 8 (eight) hours as needed. What changed:   when to take this  reasons to take this         Diet and Activity  recommendation: See Discharge Instructions above   Consults obtained -none   Major procedures and Radiology Reports - PLEASE  review detailed and final reports for all details, in brief -      No results found.  Micro Results     Recent Results (from the past 240 hour(s))  SARS CORONAVIRUS 2 (TAT 6-24 HRS) Nasopharyngeal Nasopharyngeal Swab     Status: None   Collection Time: 11/30/18  1:34 AM   Specimen: Nasopharyngeal Swab  Result Value Ref Range Status   SARS Coronavirus 2 NEGATIVE NEGATIVE Final    Comment: (NOTE) SARS-CoV-2 target nucleic acids are NOT DETECTED. The SARS-CoV-2 RNA is generally detectable in upper and lower respiratory specimens during the acute phase of infection. Negative results do not preclude SARS-CoV-2 infection, do not rule out co-infections with other pathogens, and should not be used as the sole basis for treatment or other patient management decisions. Negative results must be combined with clinical observations, patient history, and epidemiological information. The expected result is Negative. Fact Sheet for Patients: SugarRoll.be Fact Sheet for Healthcare Providers: https://www.woods-mathews.com/ This test is not yet approved or cleared by the [place] FDA and  has been authorized for detection and/or diagnosis of SARS-CoV-2 by FDA under an Emergency Use Authorization (EUA). This EUA will remain  in effect (meaning this test can be used) for the duration of the COVID-19 declaration under Section 56 4(b)(1) of the Act, 21 U.S.C. section 360bbb-3(b)(1), unless the authorization is terminated or revoked sooner. Performed at [facility], [place] [address] [place], [place] [address]        Today   Subjective:   Roger Pruitt today has no headache,no chest abdominal pain,no new weakness tingling or numbness, feels much better wants to go home today.  Objective:   Blood pressure (!)  146/91, pulse 76, temperature 98.1 F (36.7 C), temperature source Oral, resp. rate 18, height 6\' 3"  (1.905 m), weight 92.7 kg, SpO2 96 %.  No intake or output data in the 24 hours ending 12/03/18 1359  Exam Awake Alert, Oriented x 3, No new F.N deficits, Normal affect [place].AT,PERRAL Supple Neck,No JVD, No cervical lymphadenopathy appriciated.  Symmetrical Chest wall movement, Good air movement bilaterally, CTAB RRR,No Gallops,Rubs or new Murmurs, No Parasternal Heave +ve B.Sounds, Abd Soft, Non tender, No organomegaly appriciated, No rebound -guarding or rigidity. No Cyanosis, Clubbing or edema, No new Rash or bruise  Data Review   CBC w Diff:  Lab Results  Component Value Date   WBC 6.3 11/29/2018   HGB 12.8 (L) 11/29/2018   HGB 12.8 (L) 03/25/2014   HCT 38.6 (L) 11/29/2018   HCT 38.4 (L) 03/25/2014   PLT 203 11/29/2018   PLT 206 03/25/2014   LYMPHOPCT 6 10/18/2016   LYMPHOPCT 12.8 02/03/2014   MONOPCT 7 10/18/2016   MONOPCT 10.2 02/03/2014   EOSPCT 0 10/18/2016   EOSPCT 1.6 02/03/2014   BASOPCT 0 10/18/2016   BASOPCT 0.5 02/03/2014    CMP:  Lab Results  Component Value Date   NA 140 11/29/2018   NA 137 05/27/2015   NA 142 03/25/2014   K 3.5 11/29/2018   K 3.4 (L) 03/25/2014   CL 107 11/29/2018   CL 108 (H) 03/25/2014   CO2 24 11/29/2018   CO2 27 03/25/2014   BUN 15 11/29/2018   BUN 15 05/27/2015   BUN 14 03/25/2014   CREATININE 0.79 11/29/2018   CREATININE 0.82 03/25/2014   PROT 6.4 (L) 11/29/2018   PROT 6.2 (L) 02/11/2014   ALBUMIN 3.8 11/29/2018   ALBUMIN 3.2 (L) 02/11/2014   BILITOT 0.6 11/29/2018  BILITOT 0.2 02/11/2014   ALKPHOS 56 11/29/2018   ALKPHOS 58 02/11/2014   AST 16 11/29/2018   AST 9 (L) 02/11/2014   ALT 12 11/29/2018   ALT 16 02/11/2014  .   Total Time in preparing paper work, data evaluation and todays exam - 35 minutes  Epifanio Lesches M.D on 12/01/2018 at 1:59 PM    Note: This dictation was prepared with Dragon dictation  along with smaller phrase technology. Any transcriptional errors that result from this process are unintentional.

## 2018-12-06 ENCOUNTER — Emergency Department: Payer: Medicare Other

## 2018-12-06 ENCOUNTER — Encounter: Payer: Self-pay | Admitting: Emergency Medicine

## 2018-12-06 ENCOUNTER — Emergency Department
Admission: EM | Admit: 2018-12-06 | Discharge: 2018-12-06 | Disposition: A | Discharge status: Home / Self Care | Payer: Medicare Other | Attending: Emergency Medicine | Admitting: Emergency Medicine

## 2018-12-06 ENCOUNTER — Other Ambulatory Visit: Payer: Self-pay

## 2018-12-06 DIAGNOSIS — J45909 Unspecified asthma, uncomplicated: Secondary | ICD-10-CM | POA: Insufficient documentation

## 2018-12-06 DIAGNOSIS — I2510 Atherosclerotic heart disease of native coronary artery without angina pectoris: Secondary | ICD-10-CM | POA: Insufficient documentation

## 2018-12-06 DIAGNOSIS — Z20828 Contact with and (suspected) exposure to other viral communicable diseases: Secondary | ICD-10-CM | POA: Insufficient documentation

## 2018-12-06 DIAGNOSIS — R4182 Altered mental status, unspecified: Secondary | ICD-10-CM | POA: Diagnosis not present

## 2018-12-06 DIAGNOSIS — I10 Essential (primary) hypertension: Secondary | ICD-10-CM | POA: Diagnosis not present

## 2018-12-06 DIAGNOSIS — R4020 Unspecified coma: Secondary | ICD-10-CM | POA: Diagnosis not present

## 2018-12-06 DIAGNOSIS — E119 Type 2 diabetes mellitus without complications: Secondary | ICD-10-CM | POA: Diagnosis not present

## 2018-12-06 DIAGNOSIS — Z87891 Personal history of nicotine dependence: Secondary | ICD-10-CM | POA: Diagnosis not present

## 2018-12-06 DIAGNOSIS — N390 Urinary tract infection, site not specified: Secondary | ICD-10-CM | POA: Insufficient documentation

## 2018-12-06 DIAGNOSIS — Z951 Presence of aortocoronary bypass graft: Secondary | ICD-10-CM | POA: Diagnosis not present

## 2018-12-06 DIAGNOSIS — R531 Weakness: Secondary | ICD-10-CM | POA: Diagnosis not present

## 2018-12-06 DIAGNOSIS — I499 Cardiac arrhythmia, unspecified: Secondary | ICD-10-CM | POA: Diagnosis not present

## 2018-12-06 LAB — COMPREHENSIVE METABOLIC PANEL
ALT: 13 U/L (ref 0–44)
AST: 14 U/L — ABNORMAL LOW (ref 15–41)
Albumin: 3.5 g/dL (ref 3.5–5.0)
Alkaline Phosphatase: 47 U/L (ref 38–126)
Anion gap: 7 (ref 5–15)
BUN: 23 mg/dL (ref 8–23)
CO2: 24 mmol/L (ref 22–32)
Calcium: 9 mg/dL (ref 8.9–10.3)
Chloride: 108 mmol/L (ref 98–111)
Creatinine, Ser: 0.97 mg/dL (ref 0.61–1.24)
GFR calc Af Amer: >60 mL/min (ref >60)
GFR calc non Af Amer: >60 mL/min (ref >60)
Glucose, Bld: 101 mg/dL — ABNORMAL HIGH (ref 70–99)
Potassium: 3.7 mmol/L (ref 3.5–5.1)
Sodium: 139 mmol/L (ref 135–145)
Total Bilirubin: 0.5 mg/dL (ref 0.3–1.2)
Total Protein: 5.9 g/dL — ABNORMAL LOW (ref 6.5–8.1)

## 2018-12-06 LAB — URINALYSIS, COMPLETE (UACMP) WITH MICROSCOPIC
Bilirubin Urine: NEGATIVE
Glucose, UA: NEGATIVE mg/dL
Ketones, ur: NEGATIVE mg/dL
Nitrite: POSITIVE — AB
Protein, ur: NEGATIVE mg/dL
Specific Gravity, Urine: 1.009 (ref 1.005–1.030)
WBC, UA: >50 WBC/hpf — ABNORMAL HIGH (ref 0–5)
pH: 7 (ref 5.0–8.0)

## 2018-12-06 LAB — CBC WITH DIFFERENTIAL/PLATELET
Abs Immature Granulocytes: 0.08 10*3/uL — ABNORMAL HIGH (ref 0.00–0.07)
Basophils Absolute: 0 10*3/uL (ref 0.0–0.1)
Basophils Relative: 0 %
Eosinophils Absolute: 0.1 10*3/uL (ref 0.0–0.5)
Eosinophils Relative: 1 %
HCT: 38.6 % — ABNORMAL LOW (ref 39.0–52.0)
Hemoglobin: 12.4 g/dL — ABNORMAL LOW (ref 13.0–17.0)
Immature Granulocytes: 1 %
Lymphocytes Relative: 18 %
Lymphs Abs: 1.5 10*3/uL (ref 0.7–4.0)
MCH: 29 pg (ref 26.0–34.0)
MCHC: 32.1 g/dL (ref 30.0–36.0)
MCV: 90.4 fL (ref 80.0–100.0)
Monocytes Absolute: 0.7 10*3/uL (ref 0.1–1.0)
Monocytes Relative: 8 %
Neutro Abs: 6.2 10*3/uL (ref 1.7–7.7)
Neutrophils Relative %: 72 %
Platelets: 173 10*3/uL (ref 150–400)
RBC: 4.27 MIL/uL (ref 4.22–5.81)
RDW: 13.8 % (ref 11.5–15.5)
WBC: 8.6 10*3/uL (ref 4.0–10.5)
nRBC: 0 % (ref 0.0–0.2)

## 2018-12-06 LAB — URINE DRUG SCREEN, QUALITATIVE (ARMC ONLY)
Amphetamines, Ur Screen: NOT DETECTED
Barbiturates, Ur Screen: NOT DETECTED
Benzodiazepine, Ur Scrn: POSITIVE — AB
Cannabinoid 50 Ng, Ur ~~LOC~~: NOT DETECTED
Cocaine Metabolite,Ur ~~LOC~~: NOT DETECTED
MDMA (Ecstasy)Ur Screen: NOT DETECTED
Methadone Scn, Ur: NOT DETECTED
Opiate, Ur Screen: NOT DETECTED
Phencyclidine (PCP) Ur S: NOT DETECTED
Tricyclic, Ur Screen: POSITIVE — AB

## 2018-12-06 LAB — ETHANOL: Alcohol, Ethyl (B): <10 mg/dL (ref <10)

## 2018-12-06 LAB — SALICYLATE LEVEL: Salicylate Lvl: <7 mg/dL (ref 2.8–30.0)

## 2018-12-06 LAB — ACETAMINOPHEN LEVEL: Acetaminophen (Tylenol), Serum: <10 ug/mL — ABNORMAL LOW (ref 10–30)

## 2018-12-06 LAB — SARS CORONAVIRUS 2 BY RT PCR (HOSPITAL ORDER, PERFORMED IN ~~LOC~~ HOSPITAL LAB): SARS Coronavirus 2: NEGATIVE

## 2018-12-06 MED ORDER — SODIUM CHLORIDE 0.9 % IV SOLN
1000.0000 mL | Freq: Once | INTRAVENOUS | Status: AC
  Administered 2018-12-06: 1000 mL via INTRAVENOUS

## 2018-12-06 MED ORDER — CIPROFLOXACIN HCL 500 MG PO TABS: 500.0000 mg | ORAL_TABLET | Freq: Two times a day (BID) | ORAL | 0 refills | Status: DC

## 2018-12-06 MED ORDER — RISPERIDONE 4 MG PO TABS: 2.0000 mg | ORAL_TABLET | Freq: Every day | ORAL | 1 refills | Status: DC

## 2018-12-06 MED ORDER — SODIUM CHLORIDE 0.9 % IV SOLN
1.0000 g | Freq: Once | INTRAVENOUS | Status: AC
  Administered 2018-12-06: 1 g via INTRAVENOUS
  Filled 2018-12-06: qty 10

## 2018-12-06 NOTE — ED Notes (Signed)
Patient made aware of need of urine sample. Urinal within reach. States he is unable to void at this time.

## 2018-12-06 NOTE — ED Notes (Signed)
Ride arrived, pt taken to discharge.

## 2018-12-06 NOTE — ED Notes (Signed)
Condom cath placed for urine sample collection.

## 2018-12-06 NOTE — ED Notes (Signed)
Taking pt to wait for ride in lobby, discussed with first nurse, will place pt in front of officer in lobby. Pt denies complaints.

## 2018-12-06 NOTE — ED Notes (Signed)
RN talked to [place] for update on treatment to this point.

## 2018-12-06 NOTE — ED Provider Notes (Addendum)
[facility] Emergency Department Provider Note       Time seen: ----------------------------------------- 9:08 AM on 11/28/2018 -----------------------------------------   I have reviewed the triage vital signs and the nursing notes.  HISTORY   Chief Complaint Altered Mental Status    HPI Roger Pruitt is a 68 y.o. male with a history of schizoaffective disorder, depression, GERD, hypertension, hyperlipidemia, MI, obsessive-compulsive disorder, Parkinson's disease who presents to the ED for altered mental status.  Patient arrives from home by EMS for altered mental status.  Caregiver came to his house today and found that he was lethargic.  Patient denies any pain  Past Medical History:  Diagnosis Date  . Abdominal pain   . Acute cystitis without hematuria   . Anemia   . Anginal pain ([place])    rarely  . Arthritis    knees and hands  . Asthma   . Bipolar disorder ([place])    BIPOLAR/ SCHIZO/ AFFECTIVE   . BPD (bronchopulmonary dysplasia)   . BPH (benign prostatic hyperplasia)   . CAD (coronary artery disease)    a. s/p MI 2001, 2012;  b. s/p prior LCX stenting; b. 05/2012 Abnl MV; c. 05/2012 Cath: patent LCX stent w/ otw mod non-obs dzs->Med Rx.  . Carpal tunnel syndrome    surgery both hands  . Depression   . Disc degeneration, lumbar   . Dyspnea    WITH EXERTION  . Elevated PSA   . Esophageal reflux   . GERD (gastroesophageal reflux disease)   . Hematuria    history of gross and microscopic  . Hemorrhoid   . History of balanitis   . History of bladder infections   . History of echocardiogram    a. 07/2014 Echo: EF 60-65%, no rwma, nl LA size, nl RV/PASP.  Marland Kitchen History of urethral stricture   . Hyperlipidemia   . Hypertension    controlled  . Hypertensive heart disease   . Incomplete bladder emptying   . Iron deficiency anemia, unspecified   . Lower urinary tract symptoms   . Myocardial infarction ([place])    X7 LAST 2001  . Neuropathy   .  Nocturia   . Obsessive compulsive disorder   . Other dysphagia   . Panic attack   . Parkinson's disease ([place])   . Sleep apnea    SURGICAL TX  . Stroke ([place])   . Type II diabetes mellitus ([place])   . Unspecified vitamin D deficiency   . Urethral stricture   . Urinary retention   . Varicose vein of leg   . Vertigo    hx of  . Vitamin D deficiency   . Wears dentures    upper and lowers    Patient Active Problem List   Diagnosis Date Noted  . Chest pain 09/05/2018  . Iron deficiency anemia due to chronic blood loss   . Benign neoplasm of ascending colon   . Benign neoplasm of cecum   . Gastritis without bleeding   . Parkinsonism due to drug ([place]) 07/22/2016  . Chronic bilateral low back pain without sciatica 04/07/2016  . Syncope 12/31/2015  . Status post coronary artery bypass grafting 12/31/2015  . S/P CABG x 4 12/25/2015  . Unstable angina ([place]) 12/01/2015  . Hypertensive heart disease   . Type II diabetes mellitus ([place])   . Abnormal chest CT 07/10/2015  . Pulmonary nodules 07/09/2015  . Basal cell carcinoma of skin 05/27/2015  . Bipolar disorder ([place]) 05/27/2015  . CAD (  coronary artery disease), native coronary artery 05/27/2015  . DDD (degenerative disc disease), lumbosacral 05/27/2015  . Esophageal reflux 05/27/2015  . Benign essential hypertension 05/27/2015  . Sleep apnea 05/27/2015  . Urethral stricture 05/27/2015  . Benign prostatic hypertrophy (BPH) with incomplete bladder emptying 11/29/2014  . CVA (cerebral infarction) 08/23/2014  . Hypertension   . S/P coronary artery stent placement 09/30/2011  . Hyperlipidemia 09/30/2011  . History of myocardial infarction 09/03/2011  . Seasonal allergies 09/03/2011    Past Surgical History:  Procedure Laterality Date  . BACK SURGERY     lumbar disc  . BACK SURGERY  1984  . CARDIAC CATHETERIZATION  6/10;2011;Aug.2012;Oct. 2012;   . CARDIAC CATHETERIZATION  05/2012   [facility]; EF 60%, patent stent in the left  circumflex, moderate three-vessel disease with no flow-limiting lesions. Unchanged from most recent catheterization  . CARDIAC CATHETERIZATION N/A 12/10/2015   Procedure: Left Heart Cath and Coronary Angiography;  Surgeon: Corey Skains, MD;  Location: [facility];  Service: Cardiovascular;  Laterality: N/A;  . CARPAL TUNNEL RELEASE  2009   left  . CATARACT EXTRACTION W/PHACO Left 02/14/2017   Procedure: CATARACT EXTRACTION PHACO AND INTRAOCULAR LENS PLACEMENT (IOC);  Surgeon: Birder Robson, MD;  Location: [facility];  Service: Ophthalmology;  Laterality: Left;  [place] 00:42 AP% 16.6 CDE 7.14 Fluid pack lot # [id number]  . CATARACT EXTRACTION W/PHACO Right 02/28/2017   Procedure: CATARACT EXTRACTION PHACO AND INTRAOCULAR LENS PLACEMENT (IOC);  Surgeon: Birder Robson, MD;  Location: [facility];  Service: Ophthalmology;  Laterality: Right;  [place] 01:09 AP% 11.4 CDE 7.98 Fluid pack lot # [id number]  . COLONOSCOPY WITH PROPOFOL N/A 06/29/2017   Procedure: COLONOSCOPY WITH PROPOFOL;  Surgeon: [name], MD;  Location: [place];  Service: Endoscopy;  Laterality: N/A;  Diabetic - oral meds  . CORONARY ANGIOPLASTY  2011 & 2001   s/p stent  . CORONARY ARTERY BYPASS GRAFT     stent placement  . CORONARY ARTERY BYPASS GRAFT N/A 12/25/2015   Procedure: CORONARY ARTERY BYPASS GRAFTING (CABG) x4  , using left internal mammary artery and right leg greater saphenous vein harvested endoscopically LIMA-LAD SVG-OM2 SVG-DIAG SVG-PD;  Surgeon: Melrose Nakayama, MD;  Location: [place];  Service: Open Heart Surgery;  Laterality: N/A;  . [place]  . CYST REMOVAL LEG Right   . ESOPHAGOGASTRODUODENOSCOPY  06/29/2017   Procedure: ESOPHAGOGASTRODUODENOSCOPY (EGD);  Surgeon: [name], MD;  Location: [facility];  Service: Endoscopy;;  . POLYPECTOMY  06/29/2017   Procedure: POLYPECTOMY INTESTINAL;  Surgeon: [name], MD;  Location: [facility];  Service: Endoscopy;;   . SKIN GRAFT    . TEE WITHOUT CARDIOVERSION N/A 12/25/2015   Procedure: TRANSESOPHAGEAL ECHOCARDIOGRAM (TEE);  Surgeon: Melrose Nakayama, MD;  Location: [facility];  Service: Open Heart Surgery;  Laterality: N/A;  . TONSILLECTOMY    . UVULOPALATOPHARYNGOPLASTY      Allergies Morphine and related, Synalgos-dc [aspirin-caff-dihydrocodeine], and Synchlor a [chlorpheniramine]  Social History Social History   Tobacco Use  . Smoking status: Former Smoker    Packs/day: 0.50    Years: 15.00    Pack years: 7.50    Types: Cigarettes    Quit date: 05/18/1999    Years since quitting: 19.5  . Smokeless tobacco: Never Used  . Tobacco comment: quit 17 + years  Substance Use Topics  . Alcohol use: No    Alcohol/week: 0.0 standard drinks  . Drug use: No   Review of Systems Constitutional:  Negative for fever. Cardiovascular: Negative for chest pain. Respiratory: Negative for shortness of breath. Gastrointestinal: Negative for abdominal pain, vomiting and diarrhea. Musculoskeletal: Negative for back pain. Skin: Negative for rash. Neurological: Positive for weakness, lethargy  All systems negative/normal/unremarkable except as stated in the HPI  ____________________________________________   PHYSICAL EXAM:  VITAL SIGNS: ED Triage Vitals [12/20/2018 0904]  Enc Vitals Group     BP (!) 156/103     Pulse Rate (!) 58     Resp 14     Temp 98.4 F (36.9 C)     Temp Source Oral     SpO2 100 %     Weight 204 lb (92.5 kg)     Height 6\' 2"  (1.88 m)     Head Circumference      Peak Flow      Pain Score 0     Pain Loc      Pain Edu?      Excl. in [place]?     Constitutional: Drowsy and lethargic but oriented Eyes: Conjunctivae are normal. Normal extraocular movements. ENT      Head: Normocephalic and atraumatic.      Nose: No congestion/rhinnorhea.      Mouth/Throat: Mucous membranes are moist.      Neck: No stridor. Cardiovascular: Normal rate, regular rhythm. No murmurs, rubs, or  gallops. Respiratory: Normal respiratory effort without tachypnea nor retractions. Breath sounds are clear and equal bilaterally. No wheezes/rales/rhonchi. Gastrointestinal: Soft and nontender. Normal bowel sounds Musculoskeletal: Nontender with normal range of motion in extremities. No lower extremity tenderness nor edema. Neurologic:  Normal speech and language. No gross focal neurologic deficits are appreciated.  Skin:  Skin is warm, dry and intact. No rash noted. Psychiatric: Mood and affect are normal. Slurred speech ____________________________________________  EKG: Interpreted by me.  Sinus rhythm the rate of 58 bpm, prolonged PR interval, left bundle branch block, left axis deviation  ____________________________________________  ED COURSE:  As part of my medical decision making, I reviewed the following data within the [place] History obtained from family if available, nursing notes, old chart and ekg, as well as notes from prior ED visits. Patient presented for altered mental status, we will assess with labs and imaging as indicated at this time.   Procedures  Roger Pruitt was evaluated in Emergency Department on 12/17/2018 for the symptoms described in the history of present illness. He was evaluated in the context of the global COVID-19 pandemic, which necessitated consideration that the patient might be at risk for infection with the SARS-CoV-2 virus that causes COVID-19. Institutional protocols and algorithms that pertain to the evaluation of patients at risk for COVID-19 are in a state of rapid change based on information released by regulatory bodies including the CDC and federal and state organizations. These policies and algorithms were followed during the patient's care in the ED.  ____________________________________________   LABS (pertinent positives/negatives)  Labs Reviewed  COMPREHENSIVE METABOLIC PANEL - Abnormal; Notable for the following  components:      Result Value   Glucose, Bld 101 (*)    Total Protein 5.9 (*)    AST 14 (*)    All other components within normal limits  URINALYSIS, COMPLETE (UACMP) WITH MICROSCOPIC - Abnormal; Notable for the following components:   Color, Urine YELLOW (*)    APPearance CLOUDY (*)    Hgb urine dipstick SMALL (*)    Nitrite POSITIVE (*)    Leukocytes,Ua LARGE (*)    WBC,  UA >50 (*)    Bacteria, UA FEW (*)    All other components within normal limits  CBC WITH DIFFERENTIAL/PLATELET - Abnormal; Notable for the following components:   Hemoglobin 12.4 (*)    HCT 38.6 (*)    Abs Immature Granulocytes 0.08 (*)    All other components within normal limits  URINE DRUG SCREEN, QUALITATIVE ([facility] ONLY) - Abnormal; Notable for the following components:   Tricyclic, Ur Screen POSITIVE (*)    Benzodiazepine, Ur Scrn POSITIVE (*)    All other components within normal limits  ACETAMINOPHEN LEVEL - Abnormal; Notable for the following components:   Acetaminophen (Tylenol), Serum <10 (*)    All other components within normal limits  SARS CORONAVIRUS 2 (HOSPITAL ORDER, [facility])  ETHANOL  SALICYLATE LEVEL  CBG MONITORING, ED    RADIOLOGY Images were viewed by me  IMPRESSION: Stable atrophy with slight periventricular small vessel disease. Prior small infarcts in each lentiform nucleus, somewhat larger on the right than on the left. No acute infarct evident. No mass or hemorrhage.  There are foci of arterial vascular calcification. There is mucosal thickening in several ethmoid air cells. There is probable cerumen in the right external auditory canal.  ____________________________________________   DIFFERENTIAL DIAGNOSIS   Medication side effect, dehydration, electrolyte abnormality, occult infection, CVA  FINAL ASSESSMENT AND PLAN  Altered mental status, urinary tract infection   Plan: The patient had presented for altered mental status which is  most likely related to the medication he is taking. Patient's labs has not revealed any acute process other than urinary tract infection. Patient's imaging was overall stable compared to prior.  He is now awake and alert eating lunch without any complaints.  He does not want to stay in the hospital.  I will decrease his Risperdal that he takes at night and place him on Cipro for UTI.  Otherwise he is cleared for outpatient follow-up.   Laurence Aly, MD    Note: This note was generated in part or whole with voice recognition software. Voice recognition is usually quite accurate but there are transcription errors that can and very often do occur. I apologize for any typographical errors that were not detected and corrected.     Earleen Newport, MD 12/19/2018 1313    Earleen Newport, MD 11/30/2018 [phone]

## 2018-12-06 NOTE — ED Notes (Signed)
Attempted in and out cath x1 unsuccessful. Attempted in and out cath with coude catheter. Unsuccessful. Williams MD notified and aware. Patient tolerated well.

## 2018-12-06 NOTE — ED Triage Notes (Signed)
Patient presents to ED via [facility] from home. Patient lives alone and has someone who comes out to the house once a day to check on him. Care giver came to his house today and found the patient to be lethargic. On arrival patient is A&O x4 but will sleep when undisturbed. Arouses to verbal stimuli. Patients wife recently died and patient has been struggling with depression. Denies HI/SI. Slurred speech noted on arrival, EMS reports this is not new for him.

## 2018-12-06 NOTE — ED Notes (Signed)
Larene Beach, RN called to update pts emergency contact about UTI and IV antibiotics.

## 2018-12-06 NOTE — ED Notes (Signed)
Pts ride on the way to ED. Pt dressed, IV removed and discharge papers in hand. Pt in wheelchair and awaiting ride.

## 2018-12-10 ENCOUNTER — Inpatient Hospital Stay: Payer: Medicare Other

## 2018-12-10 ENCOUNTER — Other Ambulatory Visit: Payer: Self-pay

## 2018-12-10 ENCOUNTER — Encounter: Payer: Self-pay | Admitting: *Deleted

## 2018-12-10 ENCOUNTER — Emergency Department: Payer: Medicare Other

## 2018-12-10 ENCOUNTER — Inpatient Hospital Stay
Admission: EM | Admit: 2018-12-10 | Discharge: 2018-12-12 | DRG: 948 | Disposition: A | Discharge status: Home Health | Payer: Medicare Other | Attending: Internal Medicine | Admitting: Internal Medicine

## 2018-12-10 DIAGNOSIS — N400 Enlarged prostate without lower urinary tract symptoms: Secondary | ICD-10-CM | POA: Diagnosis present

## 2018-12-10 DIAGNOSIS — Z7951 Long term (current) use of inhaled steroids: Secondary | ICD-10-CM

## 2018-12-10 DIAGNOSIS — R339 Retention of urine, unspecified: Secondary | ICD-10-CM | POA: Diagnosis not present

## 2018-12-10 DIAGNOSIS — R778 Other specified abnormalities of plasma proteins: Secondary | ICD-10-CM

## 2018-12-10 DIAGNOSIS — I252 Old myocardial infarction: Secondary | ICD-10-CM

## 2018-12-10 DIAGNOSIS — E1151 Type 2 diabetes mellitus with diabetic peripheral angiopathy without gangrene: Secondary | ICD-10-CM | POA: Diagnosis present

## 2018-12-10 DIAGNOSIS — Z7984 Long term (current) use of oral hypoglycemic drugs: Secondary | ICD-10-CM | POA: Diagnosis not present

## 2018-12-10 DIAGNOSIS — F250 Schizoaffective disorder, bipolar type: Secondary | ICD-10-CM | POA: Diagnosis present

## 2018-12-10 DIAGNOSIS — Z87891 Personal history of nicotine dependence: Secondary | ICD-10-CM | POA: Diagnosis not present

## 2018-12-10 DIAGNOSIS — Z9079 Acquired absence of other genital organ(s): Secondary | ICD-10-CM

## 2018-12-10 DIAGNOSIS — Z951 Presence of aortocoronary bypass graft: Secondary | ICD-10-CM

## 2018-12-10 DIAGNOSIS — F429 Obsessive-compulsive disorder, unspecified: Secondary | ICD-10-CM | POA: Diagnosis present

## 2018-12-10 DIAGNOSIS — Z20828 Contact with and (suspected) exposure to other viral communicable diseases: Secondary | ICD-10-CM | POA: Diagnosis present

## 2018-12-10 DIAGNOSIS — I248 Other forms of acute ischemic heart disease: Secondary | ICD-10-CM | POA: Diagnosis present

## 2018-12-10 DIAGNOSIS — N3289 Other specified disorders of bladder: Secondary | ICD-10-CM | POA: Diagnosis present

## 2018-12-10 DIAGNOSIS — T424X5A Adverse effect of benzodiazepines, initial encounter: Secondary | ICD-10-CM | POA: Diagnosis present

## 2018-12-10 DIAGNOSIS — R4182 Altered mental status, unspecified: Principal | ICD-10-CM

## 2018-12-10 DIAGNOSIS — F410 Panic disorder [episodic paroxysmal anxiety] without agoraphobia: Secondary | ICD-10-CM | POA: Diagnosis present

## 2018-12-10 DIAGNOSIS — Z66 Do not resuscitate: Secondary | ICD-10-CM | POA: Diagnosis present

## 2018-12-10 DIAGNOSIS — E559 Vitamin D deficiency, unspecified: Secondary | ICD-10-CM | POA: Diagnosis present

## 2018-12-10 DIAGNOSIS — R7989 Other specified abnormal findings of blood chemistry: Secondary | ICD-10-CM

## 2018-12-10 DIAGNOSIS — G20 Parkinson's disease: Secondary | ICD-10-CM | POA: Diagnosis present

## 2018-12-10 DIAGNOSIS — Z79899 Other long term (current) drug therapy: Secondary | ICD-10-CM | POA: Diagnosis not present

## 2018-12-10 DIAGNOSIS — Z955 Presence of coronary angioplasty implant and graft: Secondary | ICD-10-CM | POA: Diagnosis not present

## 2018-12-10 DIAGNOSIS — J9811 Atelectasis: Secondary | ICD-10-CM | POA: Diagnosis not present

## 2018-12-10 DIAGNOSIS — N390 Urinary tract infection, site not specified: Secondary | ICD-10-CM | POA: Diagnosis not present

## 2018-12-10 DIAGNOSIS — T43015A Adverse effect of tricyclic antidepressants, initial encounter: Secondary | ICD-10-CM | POA: Diagnosis present

## 2018-12-10 DIAGNOSIS — Z7982 Long term (current) use of aspirin: Secondary | ICD-10-CM

## 2018-12-10 DIAGNOSIS — T40605A Adverse effect of unspecified narcotics, initial encounter: Secondary | ICD-10-CM | POA: Diagnosis present

## 2018-12-10 DIAGNOSIS — I10 Essential (primary) hypertension: Secondary | ICD-10-CM | POA: Diagnosis present

## 2018-12-10 DIAGNOSIS — I69320 Aphasia following cerebral infarction: Secondary | ICD-10-CM

## 2018-12-10 DIAGNOSIS — R531 Weakness: Secondary | ICD-10-CM | POA: Diagnosis not present

## 2018-12-10 DIAGNOSIS — I69391 Dysphagia following cerebral infarction: Secondary | ICD-10-CM

## 2018-12-10 DIAGNOSIS — N200 Calculus of kidney: Secondary | ICD-10-CM | POA: Diagnosis not present

## 2018-12-10 DIAGNOSIS — E785 Hyperlipidemia, unspecified: Secondary | ICD-10-CM | POA: Diagnosis present

## 2018-12-10 DIAGNOSIS — R1310 Dysphagia, unspecified: Secondary | ICD-10-CM | POA: Diagnosis present

## 2018-12-10 DIAGNOSIS — I2510 Atherosclerotic heart disease of native coronary artery without angina pectoris: Secondary | ICD-10-CM | POA: Diagnosis present

## 2018-12-10 DIAGNOSIS — Z03818 Encounter for observation for suspected exposure to other biological agents ruled out: Secondary | ICD-10-CM | POA: Diagnosis not present

## 2018-12-10 DIAGNOSIS — K219 Gastro-esophageal reflux disease without esophagitis: Secondary | ICD-10-CM | POA: Diagnosis present

## 2018-12-10 DIAGNOSIS — E119 Type 2 diabetes mellitus without complications: Secondary | ICD-10-CM | POA: Diagnosis not present

## 2018-12-10 LAB — CBC WITH DIFFERENTIAL/PLATELET
Abs Immature Granulocytes: 0.08 10*3/uL — ABNORMAL HIGH (ref 0.00–0.07)
Basophils Absolute: 0.1 10*3/uL (ref 0.0–0.1)
Basophils Relative: 1 %
Eosinophils Absolute: 0.3 10*3/uL (ref 0.0–0.5)
Eosinophils Relative: 4 %
HCT: 33.6 % — ABNORMAL LOW (ref 39.0–52.0)
Hemoglobin: 10.8 g/dL — ABNORMAL LOW (ref 13.0–17.0)
Immature Granulocytes: 1 %
Lymphocytes Relative: 21 %
Lymphs Abs: 1.5 10*3/uL (ref 0.7–4.0)
MCH: 29 pg (ref 26.0–34.0)
MCHC: 32.1 g/dL (ref 30.0–36.0)
MCV: 90.1 fL (ref 80.0–100.0)
Monocytes Absolute: 0.5 10*3/uL (ref 0.1–1.0)
Monocytes Relative: 7 %
Neutro Abs: 4.7 10*3/uL (ref 1.7–7.7)
Neutrophils Relative %: 66 %
Platelets: 197 10*3/uL (ref 150–400)
RBC: 3.73 MIL/uL — ABNORMAL LOW (ref 4.22–5.81)
RDW: 13.7 % (ref 11.5–15.5)
WBC: 7.1 10*3/uL (ref 4.0–10.5)
nRBC: 0 % (ref 0.0–0.2)

## 2018-12-10 LAB — COMPREHENSIVE METABOLIC PANEL
ALT: 15 U/L (ref 0–44)
AST: 14 U/L — ABNORMAL LOW (ref 15–41)
Albumin: 3.5 g/dL (ref 3.5–5.0)
Alkaline Phosphatase: 47 U/L (ref 38–126)
Anion gap: 6 (ref 5–15)
BUN: 21 mg/dL (ref 8–23)
CO2: 23 mmol/L (ref 22–32)
Calcium: 8.6 mg/dL — ABNORMAL LOW (ref 8.9–10.3)
Chloride: 112 mmol/L — ABNORMAL HIGH (ref 98–111)
Creatinine, Ser: 0.79 mg/dL (ref 0.61–1.24)
GFR calc Af Amer: >60 mL/min (ref >60)
GFR calc non Af Amer: >60 mL/min (ref >60)
Glucose, Bld: 89 mg/dL (ref 70–99)
Potassium: 3.7 mmol/L (ref 3.5–5.1)
Sodium: 141 mmol/L (ref 135–145)
Total Bilirubin: 0.5 mg/dL (ref 0.3–1.2)
Total Protein: 5.9 g/dL — ABNORMAL LOW (ref 6.5–8.1)

## 2018-12-10 LAB — LACTIC ACID, PLASMA
Lactic Acid, Venous: 1.4 mmol/L (ref 0.5–1.9)
Lactic Acid, Venous: 1 mmol/L (ref 0.5–1.9)

## 2018-12-10 LAB — LIPASE, BLOOD: Lipase: 31 U/L (ref 11–51)

## 2018-12-10 LAB — URINALYSIS, COMPLETE (UACMP) WITH MICROSCOPIC
Bacteria, UA: NONE SEEN
Bilirubin Urine: NEGATIVE
Glucose, UA: NEGATIVE mg/dL
Hgb urine dipstick: NEGATIVE
Ketones, ur: NEGATIVE mg/dL
Nitrite: NEGATIVE
Protein, ur: NEGATIVE mg/dL
Specific Gravity, Urine: 1.005 (ref 1.005–1.030)
pH: 6 (ref 5.0–8.0)

## 2018-12-10 LAB — ETHANOL: Alcohol, Ethyl (B): <10 mg/dL (ref <10)

## 2018-12-10 LAB — GLUCOSE, CAPILLARY
Glucose-Capillary: 82 mg/dL (ref 70–99)
Glucose-Capillary: 84 mg/dL (ref 70–99)

## 2018-12-10 LAB — TROPONIN I (HIGH SENSITIVITY)
Troponin I (High Sensitivity): 150 ng/L (ref <18)
Troponin I (High Sensitivity): 144 ng/L (ref <18)

## 2018-12-10 LAB — BRAIN NATRIURETIC PEPTIDE: B Natriuretic Peptide: 246 pg/mL — ABNORMAL HIGH (ref 0.0–100.0)

## 2018-12-10 LAB — SALICYLATE LEVEL: Salicylate Lvl: <7 mg/dL (ref 2.8–30.0)

## 2018-12-10 LAB — ACETAMINOPHEN LEVEL: Acetaminophen (Tylenol), Serum: <10 ug/mL — ABNORMAL LOW (ref 10–30)

## 2018-12-10 MED ORDER — GABAPENTIN 300 MG PO CAPS
300.0000 mg | ORAL_CAPSULE | Freq: Three times a day (TID) | ORAL | Status: DC
  Administered 2018-12-11 – 2018-12-12 (×5): 300 mg via ORAL
  Filled 2018-12-10 (×5): qty 1

## 2018-12-10 MED ORDER — NALOXONE HCL 2 MG/2ML IJ SOSY
0.4000 mg | PREFILLED_SYRINGE | Freq: Once | INTRAMUSCULAR | Status: AC
  Administered 2018-12-10: 0.4 mg via INTRAVENOUS
  Filled 2018-12-10: qty 2

## 2018-12-10 MED ORDER — TOPIRAMATE 100 MG PO TABS
100.0000 mg | ORAL_TABLET | Freq: Every day | ORAL | Status: DC
  Administered 2018-12-11 – 2018-12-12 (×2): 100 mg via ORAL
  Filled 2018-12-10 (×2): qty 1

## 2018-12-10 MED ORDER — NITROGLYCERIN 2 % TD OINT
1.0000 [in_us] | TOPICAL_OINTMENT | Freq: Once | TRANSDERMAL | Status: AC
  Administered 2018-12-10: 1 [in_us] via TOPICAL
  Filled 2018-12-10: qty 1

## 2018-12-10 MED ORDER — PANTOPRAZOLE SODIUM 40 MG PO TBEC
40.0000 mg | DELAYED_RELEASE_TABLET | Freq: Every day | ORAL | Status: DC
  Administered 2018-12-11 – 2018-12-12 (×2): 40 mg via ORAL
  Filled 2018-12-10 (×2): qty 1

## 2018-12-10 MED ORDER — CARVEDILOL 12.5 MG PO TABS
12.5000 mg | ORAL_TABLET | Freq: Two times a day (BID) | ORAL | Status: DC
  Administered 2018-12-11 – 2018-12-12 (×4): 12.5 mg via ORAL
  Filled 2018-12-10: qty 1
  Filled 2018-12-10: qty 2
  Filled 2018-12-10 (×2): qty 1

## 2018-12-10 MED ORDER — DOXEPIN HCL 50 MG PO CAPS
50.0000 mg | ORAL_CAPSULE | Freq: Every day | ORAL | Status: DC
  Filled 2018-12-10: qty 1

## 2018-12-10 MED ORDER — RISPERIDONE 1 MG PO TABS
2.0000 mg | ORAL_TABLET | Freq: Every day | ORAL | Status: DC
  Administered 2018-12-11: 02:00:00 2 mg via ORAL
  Filled 2018-12-10: qty 2

## 2018-12-10 MED ORDER — IOHEXOL 300 MG/ML  SOLN
100.0000 mL | Freq: Once | INTRAMUSCULAR | Status: AC | PRN
  Administered 2018-12-10: 100 mL via INTRAVENOUS

## 2018-12-10 MED ORDER — CLOPIDOGREL BISULFATE 75 MG PO TABS
300.0000 mg | ORAL_TABLET | Freq: Once | ORAL | Status: AC
  Administered 2018-12-10: 300 mg via ORAL
  Filled 2018-12-10: qty 4

## 2018-12-10 MED ORDER — SITAGLIPTIN PHOS-METFORMIN HCL 50-1000 MG PO TABS: 1.0000 | ORAL_TABLET | Freq: Two times a day (BID) | ORAL | Status: DC

## 2018-12-10 MED ORDER — NITROGLYCERIN 2 % TD OINT
0.5000 [in_us] | TOPICAL_OINTMENT | Freq: Once | TRANSDERMAL | Status: AC
  Administered 2018-12-10: 0.5 [in_us] via TOPICAL
  Filled 2018-12-10: qty 1

## 2018-12-10 MED ORDER — SODIUM CHLORIDE 0.9 % IV SOLN
INTRAVENOUS | Status: DC
  Administered 2018-12-11 – 2018-12-12 (×3): via INTRAVENOUS

## 2018-12-10 MED ORDER — ENOXAPARIN SODIUM 40 MG/0.4ML ~~LOC~~ SOLN: 40.0000 mg | SUBCUTANEOUS | Status: DC

## 2018-12-10 MED ORDER — ISOSORBIDE MONONITRATE ER 30 MG PO TB24
30.0000 mg | ORAL_TABLET | Freq: Every day | ORAL | Status: DC
  Administered 2018-12-11 – 2018-12-12 (×2): 30 mg via ORAL
  Filled 2018-12-10 (×2): qty 1

## 2018-12-10 MED ORDER — AMLODIPINE BESYLATE 10 MG PO TABS
10.0000 mg | ORAL_TABLET | Freq: Every day | ORAL | Status: DC
  Administered 2018-12-11 – 2018-12-12 (×2): 10 mg via ORAL
  Filled 2018-12-10 (×2): qty 1

## 2018-12-10 MED ORDER — HYDRALAZINE HCL 25 MG PO TABS
25.0000 mg | ORAL_TABLET | Freq: Three times a day (TID) | ORAL | Status: DC
  Administered 2018-12-11 – 2018-12-12 (×6): 25 mg via ORAL
  Filled 2018-12-10 (×6): qty 1

## 2018-12-10 NOTE — ED Notes (Signed)
Patient transported to MRI 

## 2018-12-10 NOTE — ED Notes (Signed)
Dr Cinda Quest made aware at this time elevated Troponin level as reported by lab. Troponin 150 ng/L
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
## 2018-12-10 NOTE — H&P (Signed)
[facility] [place] NAME: Roger Pruitt    MR#:  [id number]  DATE OF BIRTH:  05/13/1950  DATE OF ADMISSION:  12/10/2018  PRIMARY CARE PHYSICIAN: Casilda Carls, MD   REQUESTING/REFERRING PHYSICIAN: Conni Slipper MD  CHIEF COMPLAINT:   Chief Complaint  Patient presents with   Altered Mental Status    HISTORY OF PRESENT ILLNESS:   68 year old male with past medical hx of Bipolar Disorder, GERD, HTN, HLD, CAD, MI, Depression, iron deficiency anemia, CVA with residual deficit, Diabetes Mellitus, and sleep apnea presenting to the ED with altered mental status.  Patient is currently altered with slurred speech unable to provide reliable history therefore history mostly obtained from patient's chart.  On review of his chart patient was recently evaluated in the ED on 12/12/2018 with similar presentation.  His altered mental status was thought to be likely due to his current medications. Work-up at that time including CTA head was unremarkable except for UTI for which she was treated with Cipro and discharged to follow-up with his PCP.  Per patient's chart, patient recently lost his wife and currently lives alone.  Per patient neighbor's, patient has been a little unsteady on his feet and having some slurry speech in the last few days but this always resolved.  There have also noticed that his been very forgetful and somewhat withdrawn.  On arrival to the ED, he was afebrile with blood pressure 224/114 mm Hg and pulse rate 81 beats/min. There were no obvious focal neurological deficits; he was alert and oriented to his name only.  He was however noted to be slurring his speech.  Initial labs revealed troponin of 150 repeat 144, 82, CMP, BNP 246, hemoglobin 10.8 hematocrit 33.6 otherwise unremarkable CBC, urinalysis shows small leuks.  Urine drug screen pending.  Noncontrast CT head was obtained and showed no acute intracranial abnormality.  CT abdomen  showed markedly urinary bladder distention possible outlet obstruction and incompletely visualized groundglass density in the posterior right lower lobe possible pneumonia.  Chest x-ray showed right basilar atelectatic changes.  Given this change in mentation patient will be admitted under hospitalist service for further management.  PAST MEDICAL HISTORY:   Past Medical History:  Diagnosis Date   Abdominal pain    Acute cystitis without hematuria    Anemia    Anginal pain ([place])    rarely   Arthritis    knees and hands   Asthma    Bipolar disorder ([place])    BIPOLAR/ SCHIZO/ AFFECTIVE    BPD (bronchopulmonary dysplasia)    BPH (benign prostatic hyperplasia)    CAD (coronary artery disease)    a. s/p MI 2001, 2012;  b. s/p prior LCX stenting; b. 05/2012 Abnl MV; c. 05/2012 Cath: patent LCX stent w/ otw mod non-obs dzs->Med Rx.   Carpal tunnel syndrome    surgery both hands   Depression    Disc degeneration, lumbar    Dyspnea    WITH EXERTION   Elevated PSA    Esophageal reflux    GERD (gastroesophageal reflux disease)    Hematuria    history of gross and microscopic   Hemorrhoid    History of balanitis    History of bladder infections    History of echocardiogram    a. 07/2014 Echo: EF 60-65%, no rwma, nl LA size, nl RV/PASP.   History of urethral stricture    Hyperlipidemia    Hypertension    controlled  Hypertensive heart disease    Incomplete bladder emptying    Iron deficiency anemia, unspecified    Lower urinary tract symptoms    Myocardial infarction ([place])    X7 LAST 2001   Neuropathy    Nocturia    Obsessive compulsive disorder    Other dysphagia    Panic attack    Parkinson's disease ([place])    Sleep apnea    SURGICAL TX   Stroke ([place])    Type II diabetes mellitus ([place])    Unspecified vitamin D deficiency    Urethral stricture    Urinary retention    Varicose vein of leg    Vertigo    hx of   Vitamin D  deficiency    Wears dentures    upper and lowers    PAST SURGICAL HISTORY:   Past Surgical History:  Procedure Laterality Date   BACK SURGERY     lumbar disc   BACK SURGERY  1984   CARDIAC CATHETERIZATION  6/10;2011;Aug.2012;Oct. 2012;    CARDIAC CATHETERIZATION  05/2012   [facility]; EF 60%, patent stent in the left circumflex, moderate three-vessel disease with no flow-limiting lesions. Unchanged from most recent catheterization   CARDIAC CATHETERIZATION N/A 12/10/2015   Procedure: Left Heart Cath and Coronary Angiography;  Surgeon: Corey Skains, MD;  Location: [facility];  Service: Cardiovascular;  Laterality: N/A;   CARPAL TUNNEL RELEASE  2009   left   CATARACT EXTRACTION W/PHACO Left 02/14/2017   Procedure: CATARACT EXTRACTION PHACO AND INTRAOCULAR LENS PLACEMENT ([place]);  Surgeon: Birder Robson, MD;  Location: [facility];  Service: Ophthalmology;  Laterality: Left;  [place] 00:42 AP% 16.6 CDE 7.14 Fluid pack lot # [id number]   CATARACT EXTRACTION W/PHACO Right 02/28/2017   Procedure: CATARACT EXTRACTION PHACO AND INTRAOCULAR LENS PLACEMENT ([facility]);  Surgeon: Birder Robson, MD;  Location: [facility];  Service: Ophthalmology;  Laterality: Right;  [place] 01:09 AP% 11.4 CDE 7.98 Fluid pack lot # [id number]   COLONOSCOPY WITH PROPOFOL N/A 06/29/2017   Procedure: COLONOSCOPY WITH PROPOFOL;  Surgeon: [name], MD;  Location: [facility];  Service: Endoscopy;  Laterality: N/A;  Diabetic - oral meds   CORONARY ANGIOPLASTY  2011 & 2001   s/p stent   CORONARY ARTERY BYPASS GRAFT     stent placement   CORONARY ARTERY BYPASS GRAFT N/A 12/25/2015   Procedure: CORONARY ARTERY BYPASS GRAFTING (CABG) x4  , using left internal mammary artery and right leg greater saphenous vein harvested endoscopically LIMA-LAD SVG-OM2 SVG-DIAG SVG-PD;  Surgeon: Melrose Nakayama, MD;  Location: [facility];  Service: Open Heart Surgery;  Laterality: N/A;   COSMETIC SURGERY  1967   CYST REMOVAL  LEG Right    ESOPHAGOGASTRODUODENOSCOPY  06/29/2017   Procedure: ESOPHAGOGASTRODUODENOSCOPY (EGD);  Surgeon: [name], MD;  Location: [facility];  Service: Endoscopy;;   POLYPECTOMY  06/29/2017   Procedure: POLYPECTOMY INTESTINAL;  Surgeon: [name], MD;  Location: [facility];  Service: Endoscopy;;   SKIN GRAFT     TEE WITHOUT CARDIOVERSION N/A 12/25/2015   Procedure: TRANSESOPHAGEAL ECHOCARDIOGRAM (TEE);  Surgeon: Melrose Nakayama, MD;  Location: [facility];  Service: Open Heart Surgery;  Laterality: N/A;   TONSILLECTOMY     UVULOPALATOPHARYNGOPLASTY      SOCIAL HISTORY:   Social History   Tobacco Use   Smoking status: Former Smoker    Packs/day: 0.50    Years: 15.00    Pack years: 7.50    Types: Cigarettes    Quit  date: 05/18/1999    Years since quitting: 19.5   Smokeless tobacco: Never Used   Tobacco comment: quit 17 + years  Substance Use Topics   Alcohol use: No    Alcohol/week: 0.0 standard drinks    FAMILY HISTORY:   Family History  Problem Relation Age of Onset   Heart disease Mother    Heart disease Brother    Prostate cancer Father    Heart disease Other    Heart disease Brother    Kidney disease Neg Hx    Kidney cancer Neg Hx    Bladder Cancer Neg Hx     DRUG ALLERGIES:   Allergies  Allergen Reactions   Morphine And Related Nausea And Vomiting   Synalgos-Dc [Aspirin-Caff-Dihydrocodeine] Itching and Other (See Comments)    Reaction:  Stinging    Synchlor A [Chlorpheniramine] Rash and Other (See Comments)    Patient doesn't recall this allergy.    REVIEW OF SYSTEMS:   ROS Unable to reliably obtain from patient due to current altered mental status.  MEDICATIONS AT HOME:   Prior to Admission medications   Medication Sig Start Date End Date Taking? Authorizing Provider  acetaminophen (TYLENOL) 500 MG tablet Take 1,000 mg by mouth every 6 (six) hours as needed for moderate pain or headache.    Yes [provider]  aspirin (ASPIRIN 81) 81 MG EC tablet Take 81 mg by mouth daily.    Yes [provider]  atorvastatin (LIPITOR) 40 MG tablet Take 1 tablet (40 mg total) by mouth daily at 6 PM. 12/30/15  Yes Barrett, Erin R, PA-C  budesonide-formoterol (SYMBICORT) 160-4.5 MCG/ACT inhaler Inhale 2 puffs into the lungs 2 (two) times daily. 11/21/18  Yes Kasa, Maretta Bees, MD  buPROPion (WELLBUTRIN XL) 300 MG 24 hr tablet Take 300 mg by mouth daily. noon   Yes [provider]  carvedilol (COREG) 12.5 MG tablet Take 12.5 mg by mouth 2 (two) times daily.    Yes [provider]  ciprofloxacin (CIPRO) 500 MG tablet Take 1 tablet (500 mg total) by mouth 2 (two) times daily for 10 days. 12/20/2018 12/16/18 Yes Earleen Newport, MD  doxepin (SINEQUAN) 25 MG capsule Take 50 mg by mouth at bedtime.    Yes [provider]  gabapentin (NEURONTIN) 300 MG capsule Take 300 mg by mouth 3 (three) times daily.    Yes [provider]  hydrALAZINE (APRESOLINE) 25 MG tablet Take 1 tablet (25 mg total) by mouth every 8 (eight) hours. 12/01/18  Yes Epifanio Lesches, MD  Ipratropium-Albuterol (COMBIVENT RESPIMAT) 20-100 MCG/ACT AERS respimat INHALE ONE PUFF INTO THE LUNGS EVERY 6 HOURS Patient taking differently: Inhale 1 puff into the lungs every 6 (six) hours.  11/21/18  Yes Flora Lipps, MD  isosorbide mononitrate (IMDUR) 30 MG 24 hr tablet Take 30 mg by mouth daily. 08/13/18  Yes [provider]  JANUMET 50-1000 MG tablet Take 1 tablet by mouth 2 (two) times daily. Am and pm 08/02/16  Yes [provider]  lisinopril (PRINIVIL,ZESTRIL) 20 MG tablet Take 20 mg by mouth 2 (two) times daily.    Yes [provider]  LORazepam (ATIVAN) 1 MG tablet Take 1-2 tablets (1-2 mg total) by mouth 2 (two) times daily. Pt takes one tablet in the morning and two at bedtime. Patient taking differently: Take 1-2 mg by mouth See admin instructions. Take 1 mg by mouth in the morning  and take 2 mg by mouth at bedtime 01/04/16  Yes Kalisetti,  Radhika, MD  pantoprazole (PROTONIX) 40 MG tablet Take 40 mg by mouth daily.    Yes [provider]  risperidone (RISPERDAL) 4 MG tablet Take 0.5 tablets (2 mg total) by mouth at bedtime. 12/12/2018  Yes Earleen Newport, MD  tamsulosin (FLOMAX) 0.4 MG CAPS capsule Take 1 capsule (0.4 mg total) by mouth daily. 09/26/18  Yes McGowan, Larene Beach A, PA-C  topiramate (TOPAMAX) 100 MG tablet Take 100 mg by mouth daily at 12 noon.    Yes [provider]  traMADol (ULTRAM) 50 MG tablet Take 1 tablet (50 mg total) by mouth every 8 (eight) hours as needed. Patient taking differently: Take 50 mg by mouth every 12 (twelve) hours as needed for moderate pain.  07/20/17  Yes Cook, Jayce G, DO  amLODipine (NORVASC) 10 MG tablet Take 10 mg by mouth daily. pm 10/06/16   [provider]  fluticasone (FLONASE) 50 MCG/ACT nasal spray Place 2 sprays into both nostrils daily. Am and pm Patient not taking: Reported on 12/10/2018 11/21/18   Flora Lipps, MD  nitroGLYCERIN (NITROLINGUAL) 0.4 MG/SPRAY spray Place 1 spray under the tongue every 5 (five) minutes x 3 doses as needed for chest pain.    [provider]      VITAL SIGNS:  Blood pressure (!) 188/113, pulse 63, temperature 97.7 F (36.5 C), temperature source Oral, resp. rate 19, height 6\' 2"  (1.88 m), weight 91.5 kg, SpO2 99 %.  PHYSICAL EXAMINATION:   Physical Exam  GENERAL:  69 y.o.-year-old patient lying in the bed with no acute distress.  EYES: Pupils equal, round, reactive to light and accommodation. No scleral icterus. Extraocular muscles intact.  HEENT: Head atraumatic, normocephalic. Oropharynx and nasopharynx clear.  NECK:  Supple, no jugular venous distention. No thyroid enlargement, no tenderness.  LUNGS: Normal breath sounds bilaterally, no wheezing, rales,rhonchi or crepitation. No use of accessory muscles of respiration.  CARDIOVASCULAR: S1, S2 normal. No  murmurs, rubs, or gallops.  ABDOMEN: Soft, nontender, nondistended. Bowel sounds present. No organomegaly or mass.  EXTREMITIES: No pedal edema, cyanosis, or clubbing. No rash or lesions. + pedal pulses MUSCULOSKELETAL: Normal bulk, and power was 4+ grip and elbow, knee, and ankle flexion and extension bilaterally.  NEUROLOGIC: Alert and oriented x 1. CN 2-12 intact except for slurred speech. Sensation to light touch and cold stimuli intact bilaterally. Babinski is downgoing. DTR's (biceps, patellar, and achilles) 2+ and symmetric throughout. Gait not tested due to safety concern. PSYCHIATRIC: The patient is alert and oriented x 1.  SKIN: No obvious rash, lesion, or ulcer.   DATA REVIEWED:  LABORATORY PANEL:   CBC Recent Labs  Lab 12/10/18 1933  WBC 7.1  HGB 10.8*  HCT 33.6*  PLT 197   ------------------------------------------------------------------------------------------------------------------  Chemistries  Recent Labs  Lab 12/10/18 1933  NA 141  K 3.7  CL 112*  CO2 23  GLUCOSE 89  BUN 21  CREATININE 0.79  CALCIUM 8.6*  AST 14*  ALT 15  ALKPHOS 47  BILITOT 0.5   ------------------------------------------------------------------------------------------------------------------  Cardiac Enzymes No results for input(s): TROPONINI in the last 168 hours. ------------------------------------------------------------------------------------------------------------------  RADIOLOGY:  Ct Head Wo Contrast  Result Date: 12/10/2018 CLINICAL DATA:  Altered mental status. EXAM: CT HEAD WITHOUT CONTRAST TECHNIQUE: Contiguous axial images were obtained from the base of the skull through the vertex without intravenous contrast. COMPARISON:  12/16/2018 FINDINGS: Brain: There is no evidence of acute infarct, intracranial hemorrhage, mass, midline shift, or extra-axial fluid collection. Chronic lacunar infarcts are again seen in  the basal ganglia bilaterally. Mild cerebral atrophy is  not greater than expected for age. Vascular: Calcified atherosclerosis at the skull base. No hyperdense vessel. Skull: No fracture or focal osseous lesion. Sinuses/Orbits: Visualized paranasal sinuses and mastoid air cells are clear. Bilateral cataract extraction is noted. Other: None. IMPRESSION: 1. No evidence of acute intracranial abnormality. 2. Chronic basal ganglia lacunar infarcts. Electronically Signed   By: Logan Bores M.D.   On: 12/10/2018 21:12   Mr Brain Wo Contrast  Result Date: 12/10/2018 CLINICAL DATA:  68 year old male with altered mental status recently. Wife died about 2 weeks ago. EXAM: MRI HEAD WITHOUT CONTRAST TECHNIQUE: Multiplanar, multiecho pulse sequences of the brain and surrounding structures were obtained without intravenous contrast. COMPARISON:  Head CT earlier today.  Brain MRI 07/29/2015. FINDINGS: Brain: Stable cerebral volume. No restricted diffusion to suggest acute infarction. No midline shift, mass effect, evidence of mass lesion, ventriculomegaly, extra-axial collection or acute intracranial hemorrhage. Cervicomedullary junction and pituitary are within normal limits. A small chronic lacunar infarct of the right corona radiata and basal ganglia is stable. Stable to mildly increased patchy additional nonspecific cerebral white matter T2 and FLAIR hyperintensity in both hemispheres. No cortical encephalomalacia or chronic cerebral blood products. Stable mild T2 heterogeneity elsewhere in the deep gray nuclei. Vascular: Major intracranial vascular flow voids appear stable since 2017. generalized intracranial artery tortuosity. Skull and upper cervical spine: Negative visible cervical spine. Visualized bone marrow signal is within normal limits. Sinuses/Orbits: Postoperative changes to both globes since 2017. Otherwise stable and negative. Other: Mastoids remain clear. Visible internal auditory structures appear normal. Scalp and face soft tissues appear negative. IMPRESSION:  No acute intracranial abnormality. Stable non-contrast MRI appearance of the brain since 2017 with mild for age chronic small vessel disease. Electronically Signed   By: Genevie Ann M.D.   On: 12/10/2018 22:59   Ct Abdomen Pelvis W Contrast  Result Date: 12/10/2018 CLINICAL DATA:  Abdominal pain decreased LOC EXAM: CT ABDOMEN AND PELVIS WITH CONTRAST TECHNIQUE: Multidetector CT imaging of the abdomen and pelvis was performed using the standard protocol following bolus administration of intravenous contrast. CONTRAST:  141mL OMNIPAQUE IOHEXOL 300 MG/ML  SOLN COMPARISON:  CT 06/05/2015 FINDINGS: Lower chest: Patchy dependent atelectasis at the posterior lung bases. Mild ground-glass opacity incompletely visualized in the posterior right lower lobe. Borderline to mild cardiomegaly. Coronary vascular calcification Hepatobiliary: No focal liver abnormality is seen. No gallstones, gallbladder wall thickening, or biliary dilatation. Pancreas: Unremarkable. No pancreatic ductal dilatation or surrounding inflammatory changes. Spleen: Normal in size without focal abnormality. Adrenals/Urinary Tract: Adrenal glands are normal. Nonspecific perinephric fat stranding. No hydronephrosis. Small cyst mid left kidney. Punctate nonobstructing stones within the kidneys. Marked urinary bladder distention Stomach/Bowel: Stomach is within normal limits. Appendix appears normal. No evidence of bowel wall thickening, distention, or inflammatory changes. Large volume of stool in the colon. Vascular/Lymphatic: Extensive aortic atherosclerosis. No aneurysm. No significantly enlarged lymph nodes. Reproductive: Status post TURP Other: Negative for free air or free fluid. Small fat containing left inguinal hernia Musculoskeletal: Degenerative changes. No acute or suspicious abnormality. IMPRESSION: 1. Incompletely visualized ground-glass density in the posterior right lower lobe, possible pneumonia, to include atypical or viral etiology 2. Marked  urinary bladder distention, possible outlet obstruction. Post TURP changes of the prostate 3. Nonobstructing kidney stones Electronically Signed   By: Donavan Foil M.D.   On: 12/10/2018 21:31    EKG:  EKG: normal EKG, normal sinus rhythm, unchanged from previous tracings. Vent. rate 65 BPM PR  interval * ms QRS duration 131 ms QT/QTc 466/485 ms P-R-T axes 52 -53 40 IMPRESSION AND PLAN:   68 y.o. male with past medical hx of Bipolar Disorder, GERD, HTN, HLD, CAD, MI, Depression, iron deficiency anemia, CVA with residual deficit, Diabetes Mellitus, and sleep apnea presenting to the ED with altered mental status.  1. Altered mental status -etiology likely secondary to polypharmacy in a patient with recent similar presentation found to be taking benzos and tricyclics and opiates - Admit to MedSurg unit - CT head negative for acute intracranial abnormality - Follow-up MRI of the brain also negative with no acute finding - UDS positive for tricyclics and benzo - Hold benzodiazepine, tricyclics, antipsychotics and opiates - Check EEG although low suspicions for seizure, hold tramadol as may lower seizure threshold  2. Recent UTI - Urine culture pending - Will continue Ceftriaxone for now  3. Elevated troponin -likely due to demand ischemia, no dynamic EKG changes -hx of CAD - Will continue to trend troponin - Continue Aspirin  - HTN, HLD, DM control as below  4. HLD - + Goal LDL<100 - Atorvastatin 40mg  PO qhs  5. HTN -hypertensive on presentation + Goal BP <130/80 - Continue Coreg, amlodipine, hydralazine, lisinopril - Also noted with markedly urinary bladder distention, possible outlet obstruction - We will continue his Flomax and place Foley catheter  6. Diabetes Mellitus Type 2 with complications - Hold Janumet - CBG monitoring - SSI  7. Bipolar disorder with depression - Given altered mental status with suspected polypharmacy will hold risperidone, doxepin and  lorazepam - May need to restart this with improved mental status - Continue Wellbutrin - Psychiatric consult, patient recently lost his wife, neighbors's not dealing well with his loss  4. DVT prophylaxis - Enoxaparin SubQ   All the records are reviewed and case discussed with ED provider. Management plans discussed with the patient, family and they are in agreement.  CODE STATUS: DNR  TOTAL TIME TAKING CARE OF THIS PATIENT: 50 minutes.    on 12/10/2018 at 11:30 PM  Rufina Falco, DNP, FNP-BC [facility] Nurse Practitioner Between 7am to 6pm - Pager [phone]  After 6pm go to [email or web address] - password [email or web address] [facility]  Office  [phone]  CC: Primary care physician; Casilda Carls, MD

## 2018-12-10 NOTE — ED Notes (Signed)
Patient transported to CT 

## 2018-12-10 NOTE — ED Triage Notes (Signed)
Pt presents w/ decreased level of consciousness and AMS. Pt arrives from home where he lives alone. Recent loss of wife. Pt able to verify name but no other orientation questions. Pt denies overdose. Pt remains altered after narcan administration,.

## 2018-12-11 DIAGNOSIS — F250 Schizoaffective disorder, bipolar type: Secondary | ICD-10-CM

## 2018-12-11 LAB — PROTIME-INR
INR: 1 (ref 0.8–1.2)
Prothrombin Time: 13.1 seconds (ref 11.4–15.2)

## 2018-12-11 LAB — BASIC METABOLIC PANEL
Anion gap: 7 (ref 5–15)
BUN: 18 mg/dL (ref 8–23)
CO2: 25 mmol/L (ref 22–32)
Calcium: 8.6 mg/dL — ABNORMAL LOW (ref 8.9–10.3)
Chloride: 111 mmol/L (ref 98–111)
Creatinine, Ser: 0.88 mg/dL (ref 0.61–1.24)
GFR calc Af Amer: >60 mL/min (ref >60)
GFR calc non Af Amer: >60 mL/min (ref >60)
Glucose, Bld: 99 mg/dL (ref 70–99)
Potassium: 3.9 mmol/L (ref 3.5–5.1)
Sodium: 143 mmol/L (ref 135–145)

## 2018-12-11 LAB — CBC WITH DIFFERENTIAL/PLATELET
Abs Immature Granulocytes: 0.06 10*3/uL (ref 0.00–0.07)
Basophils Absolute: 0 10*3/uL (ref 0.0–0.1)
Basophils Relative: 1 %
Eosinophils Absolute: 0.3 10*3/uL (ref 0.0–0.5)
Eosinophils Relative: 3 %
HCT: 37.1 % — ABNORMAL LOW (ref 39.0–52.0)
Hemoglobin: 11.8 g/dL — ABNORMAL LOW (ref 13.0–17.0)
Immature Granulocytes: 1 %
Lymphocytes Relative: 15 %
Lymphs Abs: 1.2 10*3/uL (ref 0.7–4.0)
MCH: 28.6 pg (ref 26.0–34.0)
MCHC: 31.8 g/dL (ref 30.0–36.0)
MCV: 90 fL (ref 80.0–100.0)
Monocytes Absolute: 0.7 10*3/uL (ref 0.1–1.0)
Monocytes Relative: 9 %
Neutro Abs: 6 10*3/uL (ref 1.7–7.7)
Neutrophils Relative %: 71 %
Platelets: 175 10*3/uL (ref 150–400)
RBC: 4.12 MIL/uL — ABNORMAL LOW (ref 4.22–5.81)
RDW: 13.8 % (ref 11.5–15.5)
WBC: 8.3 10*3/uL (ref 4.0–10.5)
nRBC: 0 % (ref 0.0–0.2)

## 2018-12-11 LAB — URINE DRUG SCREEN, QUALITATIVE (ARMC ONLY)
Amphetamines, Ur Screen: NOT DETECTED
Barbiturates, Ur Screen: NOT DETECTED
Benzodiazepine, Ur Scrn: POSITIVE — AB
Cannabinoid 50 Ng, Ur ~~LOC~~: NOT DETECTED
Cocaine Metabolite,Ur ~~LOC~~: NOT DETECTED
MDMA (Ecstasy)Ur Screen: NOT DETECTED
Methadone Scn, Ur: NOT DETECTED
Opiate, Ur Screen: NOT DETECTED
Phencyclidine (PCP) Ur S: NOT DETECTED
Tricyclic, Ur Screen: NOT DETECTED

## 2018-12-11 LAB — GLUCOSE, CAPILLARY
Glucose-Capillary: 76 mg/dL (ref 70–99)
Glucose-Capillary: 94 mg/dL (ref 70–99)
Glucose-Capillary: 85 mg/dL (ref 70–99)
Glucose-Capillary: 81 mg/dL (ref 70–99)

## 2018-12-11 LAB — TROPONIN I (HIGH SENSITIVITY)
Troponin I (High Sensitivity): 159 ng/L (ref <18)
Troponin I (High Sensitivity): 152 ng/L (ref <18)

## 2018-12-11 LAB — APTT: aPTT: 28 seconds (ref 24–36)

## 2018-12-11 LAB — SARS CORONAVIRUS 2 BY RT PCR (HOSPITAL ORDER, PERFORMED IN ~~LOC~~ HOSPITAL LAB): SARS Coronavirus 2: NEGATIVE

## 2018-12-11 MED ORDER — RISPERIDONE 1 MG PO TABS
1.0000 mg | ORAL_TABLET | Freq: Every day | ORAL | Status: DC
  Administered 2018-12-11: 1 mg via ORAL
  Filled 2018-12-11 (×2): qty 1

## 2018-12-11 MED ORDER — NITROGLYCERIN 0.4 MG SL SUBL: 0.4000 mg | SUBLINGUAL_TABLET | SUBLINGUAL | Status: DC | PRN

## 2018-12-11 MED ORDER — TAMSULOSIN HCL 0.4 MG PO CAPS
0.4000 mg | ORAL_CAPSULE | Freq: Every day | ORAL | Status: DC
  Administered 2018-12-11 – 2018-12-12 (×2): 0.4 mg via ORAL
  Filled 2018-12-11 (×2): qty 1

## 2018-12-11 MED ORDER — ACETAMINOPHEN 325 MG PO TABS
650.0000 mg | ORAL_TABLET | Freq: Four times a day (QID) | ORAL | Status: DC | PRN
  Administered 2018-12-11: 650 mg via ORAL
  Filled 2018-12-11 (×2): qty 2

## 2018-12-11 MED ORDER — ENOXAPARIN SODIUM 40 MG/0.4ML ~~LOC~~ SOLN
40.0000 mg | SUBCUTANEOUS | Status: DC
  Administered 2018-12-11: 40 mg via SUBCUTANEOUS
  Filled 2018-12-11: qty 0.4

## 2018-12-11 MED ORDER — SODIUM CHLORIDE 0.9 % IV SOLN
1.0000 g | INTRAVENOUS | Status: DC
  Administered 2018-12-11: 1 g via INTRAVENOUS
  Filled 2018-12-11: qty 10

## 2018-12-11 MED ORDER — ATORVASTATIN CALCIUM 20 MG PO TABS
40.0000 mg | ORAL_TABLET | Freq: Every day | ORAL | Status: DC
  Administered 2018-12-11: 40 mg via ORAL
  Filled 2018-12-11: qty 2

## 2018-12-11 MED ORDER — HYDRALAZINE HCL 20 MG/ML IJ SOLN: 10.0000 mg | Freq: Four times a day (QID) | INTRAMUSCULAR | Status: DC | PRN

## 2018-12-11 MED ORDER — INSULIN ASPART 100 UNIT/ML ~~LOC~~ SOLN: 0.0000 [IU] | Freq: Every day | SUBCUTANEOUS | Status: DC

## 2018-12-11 MED ORDER — LABETALOL HCL 5 MG/ML IV SOLN
10.0000 mg | INTRAVENOUS | Status: DC | PRN
  Filled 2018-12-11: qty 4

## 2018-12-11 MED ORDER — LINAGLIPTIN 5 MG PO TABS
5.0000 mg | ORAL_TABLET | Freq: Every day | ORAL | Status: DC
  Filled 2018-12-11: qty 1

## 2018-12-11 MED ORDER — INSULIN ASPART 100 UNIT/ML ~~LOC~~ SOLN: 0.0000 [IU] | Freq: Three times a day (TID) | SUBCUTANEOUS | Status: DC

## 2018-12-11 MED ORDER — HEPARIN (PORCINE) 25000 UT/250ML-% IV SOLN
1300.0000 [IU]/h | INTRAVENOUS | Status: DC
  Administered 2018-12-11: 1300 [IU]/h via INTRAVENOUS
  Filled 2018-12-11: qty 250

## 2018-12-11 MED ORDER — METFORMIN HCL 500 MG PO TABS: 1000.0000 mg | ORAL_TABLET | Freq: Two times a day (BID) | ORAL | Status: DC

## 2018-12-11 MED ORDER — MOMETASONE FURO-FORMOTEROL FUM 200-5 MCG/ACT IN AERO
2.0000 | INHALATION_SPRAY | Freq: Two times a day (BID) | RESPIRATORY_TRACT | Status: DC
  Administered 2018-12-11 – 2018-12-12 (×3): 2 via RESPIRATORY_TRACT
  Filled 2018-12-11: qty 8.8

## 2018-12-11 MED ORDER — CHLORHEXIDINE GLUCONATE 0.12 % MT SOLN
15.0000 mL | Freq: Two times a day (BID) | OROMUCOSAL | Status: DC
  Administered 2018-12-11 – 2018-12-12 (×2): 15 mL via OROMUCOSAL
  Filled 2018-12-11 (×2): qty 15

## 2018-12-11 MED ORDER — BUPROPION HCL ER (XL) 150 MG PO TB24
300.0000 mg | ORAL_TABLET | Freq: Every day | ORAL | Status: DC
  Administered 2018-12-11 – 2018-12-12 (×2): 300 mg via ORAL
  Filled 2018-12-11 (×2): qty 2

## 2018-12-11 MED ORDER — HEPARIN BOLUS VIA INFUSION
4000.0000 [IU] | Freq: Once | INTRAVENOUS | Status: AC
  Administered 2018-12-11: 4000 [IU] via INTRAVENOUS
  Filled 2018-12-11: qty 4000

## 2018-12-11 MED ORDER — IPRATROPIUM-ALBUTEROL 20-100 MCG/ACT IN AERS
1.0000 | INHALATION_SPRAY | Freq: Four times a day (QID) | RESPIRATORY_TRACT | Status: DC
  Administered 2018-12-11 – 2018-12-12 (×4): 1 via RESPIRATORY_TRACT
  Filled 2018-12-11: qty 4

## 2018-12-11 MED ORDER — LISINOPRIL 20 MG PO TABS
20.0000 mg | ORAL_TABLET | Freq: Two times a day (BID) | ORAL | Status: DC
  Administered 2018-12-11 – 2018-12-12 (×4): 20 mg via ORAL
  Filled 2018-12-11 (×3): qty 1
  Filled 2018-12-11: qty 2

## 2018-12-11 MED ORDER — LORAZEPAM 1 MG PO TABS: 1.0000 mg | ORAL_TABLET | Freq: Two times a day (BID) | ORAL | Status: DC | PRN

## 2018-12-11 MED ORDER — ASPIRIN EC 81 MG PO TBEC
81.0000 mg | DELAYED_RELEASE_TABLET | Freq: Every day | ORAL | Status: DC
  Administered 2018-12-11 – 2018-12-12 (×2): 81 mg via ORAL
  Filled 2018-12-11 (×2): qty 1

## 2018-12-11 MED ORDER — ORAL CARE MOUTH RINSE
15.0000 mL | Freq: Two times a day (BID) | OROMUCOSAL | Status: DC
  Administered 2018-12-11: 15 mL via OROMUCOSAL

## 2018-12-11 NOTE — Progress Notes (Addendum)
Ch visited pt regarding HPOA and for grief. Pt has declined significantly since the last encounter the writer had with the pt. Pt is still grieving the loss of his wife Roger Pruitt) who the Probation officer also ministered to before their demise. The pt's children are estranged and as reported by the pt, he does not even know their location. Ch reached out the pt's next of kin (neighbor) Roger Pruitt who can be reached at [phone] to verify that she would be the pt's HPOA. Roger Pruitt stated that the pt has a written document stating his wishes, it just has not been notarized. Ch completed the [place] for the pt but may not be able to finalize the AD as the pt may present to hv altered mental status with notary present. Pt can respond appropriately for the most part but he is not currently at his base line for speech. Pt shared that he thinks he is weaker on the R side and prefers to not have heavy meats such as beef due to his limited tolerance related to swallowing.  Pt clearly communicated that he did not want any life prolonging measures and desires to be cremated when he expires and wants to remain DNR. [place] prayed at bedside with pt before leaving. Ch communicated with speech therapy and ch was informed to give CM an update on pt's existential circumstances.    12/11/18 1000  Clinical Encounter Type  Visited With Patient;Health care provider  Visit Type Follow-up;Psychological support;Spiritual support;Social support;Other (Comment) (AD education )  Referral From Physician  Consult/Referral To Chaplain  Spiritual Encounters  Spiritual Needs Emotional;Grief support;Prayer  Stress Factors  Patient Stress Factors Exhausted;Family relationships;Health changes;Lack of caregivers;Loss of control;Major life changes  Family Stress Factors None identified  Advance Directives (For Healthcare)  Does Patient Have a Medical Advance Directive? Unable to assess, patient is non-responsive or altered mental status  Would patient  like information on creating a medical advance directive? Yes (Inpatient - patient defers creating a medical advance directive and declines information at this time)

## 2018-12-11 NOTE — Consult Note (Signed)
[facility] Face-to-Face Psychiatry Consult   Reason for Consult: Patient with history of schizoaffective disorder. Referring Physician:  Cinda Quest Patient Identification: Roger Pruitt MRN:  [id number] Principal Diagnosis: <principal problem not specified> Diagnosis:  Active Problems:   Altered mental status   Total Time spent with patient: 30 minutes  Subjective:   Roger Pruitt is a 68 y.o. male patient admitted with a past psychiatric history significant for schizoaffective disorder; bipolar type versus bipolar disorder.    HPI:  Roger Pruitt is a 68 y.o. male patient admitted with a past psychiatric history significant for schizoaffective disorder; bipolar type versus bipolar disorder.  Patient was recently admitted to the medical hospital secondary to Altered mental status.  In the emergency department his blood pressure was elevated at 224/114.  His CT scan of the abdomen revealed a markedly distended urinary bladder with possible outlet obstruction and incompletely visualized groundglass density in the posterior right lower lobe with a possible old pneumonia.  He had a CT scan of the head in the past that showed an old CVA, and an MRI was no acute intracranial abnormality.  There was stable noncontrast MRI appearance of the brain since 2017 with mild but chronic multivessel disease.  Patient stated that he has been compliant with his medications.  He has been taking Risperdal, Wellbutrin and lorazepam.  His drug screen on admission was significant only for lorazepam.  He has been followed at [facility] locally.  He stated that is where his prescriptions are filled.  His wife unfortunately died approximately 2 weeks ago.  He becomes tearful over this, but denied any suicidal or homicidal ideation.  He denied any auditory or visual hallucinations.  When asked about his slurred speech he stated that his speech was this way prior to his most recent hospitalization.  With regards to his psychiatric medications  he continues on Wellbutrin XL, gabapentin, Topamax if that is psychiatrically related, but it does appear that his lorazepam was left off.  Review of the PMP database revealed his last prescription for lorazepam was on 11/05/2018.  He received 91 mg tablets.  Review of the date shows that this is been a stable medication for him for an extended period of time.  He admitted to a previous history of substance issues, but that was multiple years ago.  He recalled that the last time he had used any alcohol or other substances was 1975.  He had been married for 19 years, and his wife had been ill for a significant period of time.  He stated that she had bad COPD and chronic pain problems.  He stated his last psychiatric hospitalization was multiple years ago.  As per above, he is alert and oriented x3.  Polite, pleasant not suicidal, not homicidal, not psychotic.  He is appropriately grieving.  Past Psychiatric History: Patient has a longstanding history of schizoaffective disorder; bipolar type versus bipolar disorder.  He stated his last psychiatric Hospitalization was many years ago.  He has been followed most recently by [facility].  Prior to that he was followed by Dr. Randel Books who apparently passed away sometime in recent history.  Risk to Self:  None Risk to Others:  None Prior Inpatient Therapy:   Prior Outpatient Therapy:    Past Medical History:  Past Medical History:  Diagnosis Date  . Abdominal pain   . Acute cystitis without hematuria   . Anemia   . Anginal pain ([place])    rarely  . Arthritis  knees and hands  . Asthma   . Bipolar disorder ([place])    BIPOLAR/ SCHIZO/ AFFECTIVE   . BPD (bronchopulmonary dysplasia)   . BPH (benign prostatic hyperplasia)   . CAD (coronary artery disease)    a. s/p MI 2001, 2012;  b. s/p prior LCX stenting; b. 05/2012 Abnl MV; c. 05/2012 Cath: patent LCX stent w/ otw mod non-obs dzs->Med Rx.  . Carpal tunnel syndrome    surgery both hands  . Depression   . Disc  degeneration, lumbar   . Dyspnea    WITH EXERTION  . Elevated PSA   . Esophageal reflux   . GERD (gastroesophageal reflux disease)   . Hematuria    history of gross and microscopic  . Hemorrhoid   . History of balanitis   . History of bladder infections   . History of echocardiogram    a. 07/2014 Echo: EF 60-65%, no rwma, nl LA size, nl RV/PASP.  Marland Kitchen History of urethral stricture   . Hyperlipidemia   . Hypertension    controlled  . Hypertensive heart disease   . Incomplete bladder emptying   . Iron deficiency anemia, unspecified   . Lower urinary tract symptoms   . Myocardial infarction ([place])    X7 LAST 2001  . Neuropathy   . Nocturia   . Obsessive compulsive disorder   . Other dysphagia   . Panic attack   . Parkinson's disease ([place])   . Sleep apnea    SURGICAL TX  . Stroke ([place])   . Type II diabetes mellitus ([place])   . Unspecified vitamin D deficiency   . Urethral stricture   . Urinary retention   . Varicose vein of leg   . Vertigo    hx of  . Vitamin D deficiency   . Wears dentures    upper and lowers    Past Surgical History:  Procedure Laterality Date  . BACK SURGERY     lumbar disc  . BACK SURGERY  1984  . CARDIAC CATHETERIZATION  6/10;2011;Aug.2012;Oct. 2012;   . CARDIAC CATHETERIZATION  05/2012   [facility]; EF 60%, patent stent in the left circumflex, moderate three-vessel disease with no flow-limiting lesions. Unchanged from most recent catheterization  . CARDIAC CATHETERIZATION N/A 12/10/2015   Procedure: Left Heart Cath and Coronary Angiography;  Surgeon: Corey Skains, MD;  Location: [place] [facility];  Service: Cardiovascular;  Laterality: N/A;  . CARPAL TUNNEL RELEASE  2009   left  . CATARACT EXTRACTION W/PHACO Left 02/14/2017   Procedure: CATARACT EXTRACTION PHACO AND INTRAOCULAR LENS PLACEMENT (IOC);  Surgeon: Birder Robson, MD;  Location: [facility] OR[facility];  Service: Ophthalmology;  Laterality: Left;  [place] 00:42 AP% 16.6 CDE 7.14 Fluid pack lot #  [id number]  . CATARACT EXTRACTION W/PHACO Right 02/28/2017   Procedure: CATARACT EXTRACTION PHACO AND INTRAOCULAR LENS PLACEMENT (IOC);  Surgeon: Birder Robson, MD;  Location: [facility] OR[facility];  Service: Ophthalmology;  Laterality: Right;  [place] 01:09 AP% 11.4 CDE 7.98 Fluid pack lot # [id number]  . COLONOSCOPY WITH PROPOFOL N/A 06/29/2017   Procedure: COLONOSCOPY WITH PROPOFOL;  Surgeon: [name], MD;  Location: [place];  Service: Endoscopy;  Laterality: N/A;  Diabetic - oral meds  . CORONARY ANGIOPLASTY  2011 & 2001   s/p stent  . CORONARY ARTERY BYPASS GRAFT     stent placement  . CORONARY ARTERY BYPASS GRAFT N/A 12/25/2015   Procedure: CORONARY ARTERY BYPASS GRAFTING (CABG) x4  , using left internal mammary artery and right leg  greater saphenous vein harvested endoscopically LIMA-LAD SVG-OM2 SVG-DIAG SVG-PD;  Surgeon: Melrose Nakayama, MD;  Location: [place];  Service: Open Heart Surgery;  Laterality: N/A;  . [place]  . CYST REMOVAL LEG Right   . ESOPHAGOGASTRODUODENOSCOPY  06/29/2017   Procedure: ESOPHAGOGASTRODUODENOSCOPY (EGD);  Surgeon: [name], MD;  Location: [place];  Service: Endoscopy;;  . POLYPECTOMY  06/29/2017   Procedure: POLYPECTOMY INTESTINAL;  Surgeon: [name], MD;  Location: [place];  Service: Endoscopy;;  . SKIN GRAFT    . TEE WITHOUT CARDIOVERSION N/A 12/25/2015   Procedure: TRANSESOPHAGEAL ECHOCARDIOGRAM (TEE);  Surgeon: Melrose Nakayama, MD;  Location: [place];  Service: Open Heart Surgery;  Laterality: N/A;  . TONSILLECTOMY    . UVULOPALATOPHARYNGOPLASTY     Family History:  Family History  Problem Relation Age of Onset  . Heart disease Mother   . Heart disease Brother   . Prostate cancer Father   . Heart disease Other   . Heart disease Brother   . Kidney disease Neg Hx   . Kidney cancer Neg Hx   . Bladder Cancer Neg Hx    Family Psychiatric  History: Noncontributory Social History:  Social History    Substance and Sexual Activity  Alcohol Use No  . Alcohol/week: 0.0 standard drinks     Social History   Substance and Sexual Activity  Drug Use No    Social History   Socioeconomic History  . Marital status: Married    Spouse name: Not on file  . Number of children: Not on file  . Years of education: Not on file  . Highest education level: Not on file  Occupational History  . Not on file  Social Needs  . Financial resource strain: Not on file  . Food insecurity    Worry: Not on file    Inability: Not on file  . Transportation needs    Medical: Not on file    Non-medical: Not on file  Tobacco Use  . Smoking status: Former Smoker    Packs/day: 0.50    Years: 15.00    Pack years: 7.50    Types: Cigarettes    Quit date: 05/18/1999    Years since quitting: 19.5  . Smokeless tobacco: Never Used  . Tobacco comment: quit 17 + years  Substance and Sexual Activity  . Alcohol use: No    Alcohol/week: 0.0 standard drinks  . Drug use: No  . Sexual activity: Not on file  Lifestyle  . Physical activity    Days per week: Not on file    Minutes per session: Not on file  . Stress: Not on file  Relationships  . Social Herbalist on phone: Not on file    Gets together: Not on file    Attends religious service: Not on file    Active member of club or organization: Not on file    Attends meetings of clubs or organizations: Not on file    Relationship status: Not on file  Other Topics Concern  . Not on file  Social History Narrative   ** Merged History Encounter **       Additional Social History:    Allergies:   Allergies  Allergen Reactions  . Morphine And Related Nausea And Vomiting  . Synalgos-Dc [Aspirin-Caff-Dihydrocodeine] Itching and Other (See Comments)    Reaction:  Stinging   . Synchlor A [Chlorpheniramine] Rash and Other (See Comments)    Patient  doesn't recall this allergy.    Labs:  Results for orders placed or performed during the  hospital encounter of 12/10/18 (from the past 48 hour(s))  Glucose, capillary     Status: None   Collection Time: 12/10/18  7:22 PM  Result Value Ref Range   Glucose-Capillary 82 70 - 99 mg/dL  Comprehensive metabolic panel     Status: Abnormal   Collection Time: 12/10/18  7:33 PM  Result Value Ref Range   Sodium 141 135 - 145 mmol/L   Potassium 3.7 3.5 - 5.1 mmol/L   Chloride 112 (H) 98 - 111 mmol/L   CO2 23 22 - 32 mmol/L   Glucose, Bld 89 70 - 99 mg/dL   BUN 21 8 - 23 mg/dL   Creatinine, Ser 0.79 0.61 - 1.24 mg/dL   Calcium 8.6 (L) 8.9 - 10.3 mg/dL   Total Protein 5.9 (L) 6.5 - 8.1 g/dL   Albumin 3.5 3.5 - 5.0 g/dL   AST 14 (L) 15 - 41 U/L   ALT 15 0 - 44 U/L   Alkaline Phosphatase 47 38 - 126 U/L   Total Bilirubin 0.5 0.3 - 1.2 mg/dL   GFR calc non Af Amer >60 >60 mL/min   GFR calc Af Amer >60 >60 mL/min   Anion gap 6 5 - 15    Comment: Performed at [facility], [address] [place], [place] [address]  Lipase, blood     Status: None   Collection Time: 12/10/18  7:33 PM  Result Value Ref Range   Lipase 31 11 - 51 U/L    Comment: Performed at [facility], [address] [place], [place] [address]  Troponin I (High Sensitivity)     Status: Abnormal   Collection Time: 12/10/18  7:33 PM  Result Value Ref Range   Troponin I (High Sensitivity) 150 (HH) <18 ng/L    Comment: CRITICAL RESULT CALLED TO, READ BACK BY AND VERIFIED WITH BUTCH WOODS AT 2024 12/10/2018  TFK (NOTE) Elevated high sensitivity troponin I (hsTnI) values and significant  changes across serial measurements may suggest ACS but many other  chronic and acute conditions are known to elevate hsTnI results.  Refer to the "Links" section for chest pain algorithms and additional  guidance. Performed at [facility], [address] [place], [place] [address]   Lactic acid, plasma     Status: None   Collection Time: 12/10/18  7:33 PM  Result Value Ref Range   Lactic Acid,  Venous 1.4 0.5 - 1.9 mmol/L    Comment: Performed at [facility], [address] [place], [place] [address]  Brain natriuretic peptide     Status: Abnormal   Collection Time: 12/10/18  7:33 PM  Result Value Ref Range   B Natriuretic Peptide 246.0 (H) 0.0 - 100.0 pg/mL    Comment: Performed at [facility], [address] [place], [place] [address]  Ethanol     Status: None   Collection Time: 12/10/18  7:33 PM  Result Value Ref Range   Alcohol, Ethyl (B) <10 <10 mg/dL    Comment: (NOTE) Lowest detectable limit for serum alcohol is 10 mg/dL. For medical purposes only. Performed at [facility], [address] [place], [place] [address]   Acetaminophen level     Status: Abnormal   Collection Time: 12/10/18  7:33 PM  Result Value Ref Range   Acetaminophen (Tylenol), Serum <10 (L) 10 - 30 ug/mL  Comment: (NOTE) Therapeutic concentrations vary significantly. A range of 10-30 ug/mL  may be an effective concentration for many patients. However, some  are best treated at concentrations outside of this range. Acetaminophen concentrations >150 ug/mL at 4 hours after ingestion  and >50 ug/mL at 12 hours after ingestion are often associated with  toxic reactions. Performed at [facility], [address] [place], [place] [id number]   Salicylate level     Status: None   Collection Time: 12/10/18  7:33 PM  Result Value Ref Range   Salicylate Lvl [id number] 2.8 - 30.0 mg/dL    Comment: Performed at [facility], [address] [place], [place] [address]  CBC with Differential     Status: Abnormal   Collection Time: 12/10/18  7:33 PM  Result Value Ref Range   WBC 7.1 4.0 - 10.5 K/uL   RBC 3.73 (L) 4.22 - 5.81 MIL/uL   Hemoglobin 10.8 (L) 13.0 - 17.0 g/dL   HCT 33.6 (L) 39.0 - 52.0 %   MCV 90.1 80.0 - 100.0 fL   MCH 29.0 26.0 - 34.0 pg   MCHC 32.1 30.0 - 36.0 g/dL   RDW 13.7 11.5 - 15.5 %   Platelets 197 150 - 400 K/uL   nRBC 0.0 0.0 -  0.2 %   Neutrophils Relative % 66 %   Neutro Abs 4.7 1.7 - 7.7 K/uL   Lymphocytes Relative 21 %   Lymphs Abs 1.5 0.7 - 4.0 K/uL   Monocytes Relative 7 %   Monocytes Absolute 0.5 0.1 - 1.0 K/uL   Eosinophils Relative 4 %   Eosinophils Absolute 0.3 0.0 - 0.5 K/uL   Basophils Relative 1 %   Basophils Absolute 0.1 0.0 - 0.1 K/uL   Immature Granulocytes 1 %   Abs Immature Granulocytes 0.08 (H) 0.00 - 0.07 K/uL    Comment: Performed at [facility], [address] [place], [place] [address]  Urinalysis, Complete w Microscopic     Status: Abnormal   Collection Time: 12/10/18  7:33 PM  Result Value Ref Range   Color, Urine STRAW (A) YELLOW   APPearance CLEAR (A) CLEAR   Specific Gravity, Urine 1.005 1.005 - 1.030   pH 6.0 5.0 - 8.0   Glucose, UA NEGATIVE NEGATIVE mg/dL   Hgb urine dipstick NEGATIVE NEGATIVE   Bilirubin Urine NEGATIVE NEGATIVE   Ketones, ur NEGATIVE NEGATIVE mg/dL   Protein, ur NEGATIVE NEGATIVE mg/dL   Nitrite NEGATIVE NEGATIVE   Leukocytes,Ua SMALL (A) NEGATIVE   RBC / HPF 0-5 0 - 5 RBC/hpf   WBC, UA 6-10 0 - 5 WBC/hpf   Bacteria, UA NONE SEEN NONE SEEN   Squamous Epithelial / LPF 0-5 0 - 5    Comment: Performed at [facility], [address] [place], [place] [address]  Urine Drug Screen, Qualitative     Status: Abnormal   Collection Time: 12/10/18  7:33 PM  Result Value Ref Range   Tricyclic, Ur Screen NONE DETECTED NONE DETECTED   Amphetamines, Ur Screen NONE DETECTED NONE DETECTED   MDMA (Ecstasy)Ur Screen NONE DETECTED NONE DETECTED   Cocaine Metabolite,Ur [place] NONE DETECTED NONE DETECTED   Opiate, Ur Screen NONE DETECTED NONE DETECTED   Phencyclidine (PCP) Ur S NONE DETECTED NONE DETECTED   Cannabinoid 50 Ng, Ur [place] NONE DETECTED NONE DETECTED   Barbiturates, Ur Screen NONE DETECTED NONE DETECTED   Benzodiazepine, Ur Scrn POSITIVE (A) NONE DETECTED   Methadone Scn, Ur NONE DETECTED NONE  DETECTED    Comment: (NOTE) Tricyclics +  metabolites, urine    Cutoff 1000 ng/mL Amphetamines + metabolites, urine  Cutoff 1000 ng/mL MDMA (Ecstasy), urine              Cutoff 500 ng/mL Cocaine Metabolite, urine          Cutoff 300 ng/mL Opiate + metabolites, urine        Cutoff 300 ng/mL Phencyclidine (PCP), urine         Cutoff 25 ng/mL Cannabinoid, urine                 Cutoff 50 ng/mL Barbiturates + metabolites, urine  Cutoff 200 ng/mL Benzodiazepine, urine              Cutoff 200 ng/mL Methadone, urine                   Cutoff 300 ng/mL The urine drug screen provides only a preliminary, unconfirmed analytical test result and should not be used for non-medical purposes. Clinical consideration and professional judgment should be applied to any positive drug screen result due to possible interfering substances. A more specific alternate chemical method must be used in order to obtain a confirmed analytical result. Gas chromatography / mass spectrometry (GC/MS) is the preferred confirmat ory method. Performed at [facility], [address] [place], [place] [address]   Lactic acid, plasma     Status: None   Collection Time: 12/10/18  9:29 PM  Result Value Ref Range   Lactic Acid, Venous 1.0 0.5 - 1.9 mmol/L    Comment: Performed at [facility], [place], [place] [address]  Troponin I (High Sensitivity)     Status: Abnormal   Collection Time: 12/10/18  9:29 PM  Result Value Ref Range   Troponin I (High Sensitivity) 144 (HH) <18 ng/L    Comment: CRITICAL VALUE NOTED. VALUE IS CONSISTENT WITH PREVIOUSLY REPORTED/CALLED VALUE SNG (NOTE) Elevated high sensitivity troponin I (hsTnI) values and significant  changes across serial measurements may suggest ACS but many other  chronic and acute conditions are known to elevate hsTnI results.  Refer to the "Links" section for chest pain algorithms and additional  guidance. Performed at [facility], [address]  [place], [place] [address]   Glucose, capillary     Status: None   Collection Time: 12/10/18 11:49 PM  Result Value Ref Range   Glucose-Capillary 84 70 - 99 mg/dL  SARS Coronavirus 2 [facility] order, Performed in [facility] hospital lab) Nasopharyngeal Nasopharyngeal Swab     Status: None   Collection Time: 12/11/18  1:11 AM   Specimen: Nasopharyngeal Swab  Result Value Ref Range   SARS Coronavirus 2 NEGATIVE NEGATIVE    Comment: (NOTE) If result is NEGATIVE SARS-CoV-2 target nucleic acids are NOT DETECTED. The SARS-CoV-2 RNA is generally detectable in upper and lower  respiratory specimens during the acute phase of infection. The lowest  concentration of SARS-CoV-2 viral copies this assay can detect is 250  copies / mL. A negative result does not preclude SARS-CoV-2 infection  and should not be used as the sole basis for treatment or other  patient management decisions.  A negative result may occur with  improper specimen collection / handling, submission of specimen other  than nasopharyngeal swab, presence of viral mutation(s) within the  areas targeted by this assay, and inadequate number of viral copies  (<250 copies / mL). A negative result must be combined  with clinical  observations, patient history, and epidemiological information. If result is POSITIVE SARS-CoV-2 target nucleic acids are DETECTED. The SARS-CoV-2 RNA is generally detectable in upper and lower  respiratory specimens dur ing the acute phase of infection.  Positive  results are indicative of active infection with SARS-CoV-2.  Clinical  correlation with patient history and other diagnostic information is  necessary to determine patient infection status.  Positive results do  not rule out bacterial infection or co-infection with other viruses. If result is PRESUMPTIVE POSTIVE SARS-CoV-2 nucleic acids MAY BE PRESENT.   A presumptive positive result was obtained on the submitted specimen  and confirmed on repeat  testing.  While 2019 novel coronavirus  (SARS-CoV-2) nucleic acids may be present in the submitted sample  additional confirmatory testing may be necessary for epidemiological  and / or clinical management purposes  to differentiate between  SARS-CoV-2 and other Sarbecovirus currently known to infect humans.  If clinically indicated additional testing with an alternate test  methodology [phone]) is advised. The SARS-CoV-2 RNA is generally  detectable in upper and lower respiratory sp ecimens during the acute  phase of infection. The expected result is Negative. Fact Sheet for Patients:  StrictlyIdeas.no Fact Sheet for Healthcare Providers: BankingDealers.co.za This test is not yet approved or cleared by the [place] FDA and has been authorized for detection and/or diagnosis of SARS-CoV-2 by FDA under an Emergency Use Authorization (EUA).  This EUA will remain in effect (meaning this test can be used) for the duration of the COVID-19 declaration under Section 564(b)(1) of the Act, 21 U.S.C. section 360bbb-3(b)(1), unless the authorization is terminated or revoked sooner. Performed at [facility], [place], [place] [address]   Troponin I (High Sensitivity)     Status: Abnormal   Collection Time: 12/11/18  6:30 AM  Result Value Ref Range   Troponin I (High Sensitivity) 159 (HH) <18 ng/L    Comment: CRITICAL VALUE NOTED. VALUE IS CONSISTENT WITH PREVIOUSLY REPORTED/CALLED VALUE/HKP (NOTE) Elevated high sensitivity troponin I (hsTnI) values and significant  changes across serial measurements may suggest ACS but many other  chronic and acute conditions are known to elevate hsTnI results.  Refer to the "Links" section for chest pain algorithms and additional  guidance. Performed at [facility], [address] [place], [place] [id number]   Basic metabolic panel     Status: Abnormal   Collection  Time: 12/11/18  6:30 AM  Result Value Ref Range   Sodium 143 135 - 145 mmol/L   Potassium 3.9 3.5 - 5.1 mmol/L   Chloride 111 98 - 111 mmol/L   CO2 25 22 - 32 mmol/L   Glucose, Bld 99 70 - 99 mg/dL   BUN 18 8 - 23 mg/dL   Creatinine, Ser 0.88 0.61 - 1.24 mg/dL   Calcium 8.6 (L) 8.9 - 10.3 mg/dL   GFR calc non Af Amer >60 >60 mL/min   GFR calc Af Amer >60 >60 mL/min   Anion gap 7 5 - 15    Comment: Performed at [facility], [address] [place], [place] [address]  Protime-INR     Status: None   Collection Time: 12/11/18  7:08 AM  Result Value Ref Range   Prothrombin Time 13.1 11.4 - 15.2 seconds   INR 1.0 0.8 - 1.2    Comment: (NOTE) INR goal varies based on device and disease states. Performed at [facility], [address], [place], [place] [address]  APTT     Status: None   Collection Time: 12/11/18  7:08 AM  Result Value Ref Range   aPTT 28 24 - 36 seconds    Comment: Performed at [facility], [address] [place], [place] [address]  CBC with Differential/Platelet     Status: Abnormal   Collection Time: 12/11/18  7:30 AM  Result Value Ref Range   WBC 8.3 4.0 - 10.5 K/uL   RBC 4.12 (L) 4.22 - 5.81 MIL/uL   Hemoglobin 11.8 (L) 13.0 - 17.0 g/dL   HCT 37.1 (L) 39.0 - 52.0 %   MCV 90.0 80.0 - 100.0 fL   MCH 28.6 26.0 - 34.0 pg   MCHC 31.8 30.0 - 36.0 g/dL   RDW 13.8 11.5 - 15.5 %   Platelets 175 150 - 400 K/uL   nRBC 0.0 0.0 - 0.2 %   Neutrophils Relative % 71 %   Neutro Abs 6.0 1.7 - 7.7 K/uL   Lymphocytes Relative 15 %   Lymphs Abs 1.2 0.7 - 4.0 K/uL   Monocytes Relative 9 %   Monocytes Absolute 0.7 0.1 - 1.0 K/uL   Eosinophils Relative 3 %   Eosinophils Absolute 0.3 0.0 - 0.5 K/uL   Basophils Relative 1 %   Basophils Absolute 0.0 0.0 - 0.1 K/uL   Immature Granulocytes 1 %   Abs Immature Granulocytes 0.06 0.00 - 0.07 K/uL    Comment: Performed at [facility], [address] [place], [place] [address]  Glucose,  capillary     Status: None   Collection Time: 12/11/18  7:32 AM  Result Value Ref Range   Glucose-Capillary 76 70 - 99 mg/dL  Troponin I (High Sensitivity)     Status: Abnormal   Collection Time: 12/11/18  8:29 AM  Result Value Ref Range   Troponin I (High Sensitivity) 152 (HH) <18 ng/L    Comment: CRITICAL VALUE NOTED. VALUE IS CONSISTENT WITH PREVIOUSLY REPORTED/CALLED VALUE/HKP (NOTE) Elevated high sensitivity troponin I (hsTnI) values and significant  changes across serial measurements may suggest ACS but many other  chronic and acute conditions are known to elevate hsTnI results.  Refer to the "Links" section for chest pain algorithms and additional  guidance. Performed at [facility], [address] [place], [place] [address]   Glucose, capillary     Status: None   Collection Time: 12/11/18 11:32 AM  Result Value Ref Range   Glucose-Capillary 94 70 - 99 mg/dL    Current Facility-Administered Medications  Medication Dose Route Frequency Provider Last Rate Last Dose  . 0.9 %  sodium chloride infusion   Intravenous Continuous Lang Snow, NP 75 mL/hr at 12/11/18 0534    . acetaminophen (TYLENOL) tablet 650 mg  650 mg Oral Q6H PRN Fritzi Mandes, MD   650 mg at 12/11/18 1114  . amLODipine (NORVASC) tablet 10 mg  10 mg Oral Daily Nena Polio, MD   10 mg at 12/11/18 0955  . aspirin EC tablet 81 mg  81 mg Oral Daily Lang Snow, NP   81 mg at 12/11/18 0955  . atorvastatin (LIPITOR) tablet 40 mg  40 mg Oral q1800 Lang Snow, NP      . buPROPion (WELLBUTRIN XL) 24 hr tablet 300 mg  300 mg Oral Daily Lang Snow, NP   300 mg at 12/11/18 0956  . carvedilol (COREG) tablet 12.5 mg  12.5 mg Oral BID Nena Polio, MD   12.5 mg at  12/11/18 0955  . cefTRIAXone (ROCEPHIN) 1 g in sodium chloride 0.9 % 100 mL IVPB  1 g Intravenous Q24H Lang Snow, NP   Stopped at 12/11/18 0335  . gabapentin (NEURONTIN) capsule 300 mg   300 mg Oral TID Nena Polio, MD   300 mg at 12/11/18 0955  . heparin ADULT infusion 100 units/mL (25000 units/243mL sodium chloride 0.45%)  1,300 Units/hr Intravenous Continuous Hart Robinsons A, RPH 13 mL/hr at 12/11/18 0827 1,300 Units/hr at 12/11/18 0827  . hydrALAZINE (APRESOLINE) injection 10 mg  10 mg Intravenous Q6H PRN Lang Snow, NP      . hydrALAZINE (APRESOLINE) tablet 25 mg  25 mg Oral Q8H Nena Polio, MD   25 mg at 12/11/18 0956  . insulin aspart (novoLOG) injection 0-5 Units  0-5 Units Subcutaneous QHS Ouma, Bing Neighbors, NP      . insulin aspart (novoLOG) injection 0-9 Units  0-9 Units Subcutaneous TID WC Lang Snow, NP      . Ipratropium-Albuterol (COMBIVENT) respimat 1 puff  1 puff Inhalation Q6H Ouma, Bing Neighbors, NP      . isosorbide mononitrate (IMDUR) 24 hr tablet 30 mg  30 mg Oral Daily Nena Polio, MD   30 mg at 12/11/18 0955  . labetalol (NORMODYNE) injection 10 mg  10 mg Intravenous Q2H PRN Lang Snow, NP      . lisinopril (ZESTRIL) tablet 20 mg  20 mg Oral BID Lang Snow, NP   20 mg at 12/11/18 0957  . mometasone-formoterol (DULERA) 200-5 MCG/ACT inhaler 2 puff  2 puff Inhalation BID Lang Snow, NP   2 puff at 12/11/18 0959  . nitroGLYCERIN (NITROSTAT) SL tablet 0.4 mg  0.4 mg Sublingual Q5 Min x 3 PRN Lang Snow, NP      . pantoprazole (PROTONIX) EC tablet 40 mg  40 mg Oral Daily Nena Polio, MD   40 mg at 12/11/18 0956  . tamsulosin (FLOMAX) capsule 0.4 mg  0.4 mg Oral Daily Lang Snow, NP   0.4 mg at 12/11/18 0955  . topiramate (TOPAMAX) tablet 100 mg  100 mg Oral Q1200 Nena Polio, MD        Musculoskeletal: Strength & Muscle Tone: decreased Gait & Station: In hospital bed. Patient leans: N/A  Psychiatric Specialty Exam: Physical Exam  Nursing note and vitals reviewed. Constitutional: He is oriented to person, place, and time. He appears  well-developed and well-nourished.  HENT:  Head: Normocephalic and atraumatic.  Respiratory: Effort normal.  Neurological: He is alert and oriented to person, place, and time.    ROS  Blood pressure (!) 175/115, pulse 70, temperature 97.9 F (36.6 C), resp. rate 16, height 6\' 3"  (1.905 m), weight 117.7 kg, SpO2 100 %.Body mass index is 32.44 kg/m.  General Appearance: Casual  Eye Contact:  Fair  Speech:  Slow and Slurred  Volume:  Decreased  Mood:  Depressed  Affect:  Congruent  Thought Process:  Coherent and Descriptions of Associations: Intact  Orientation:  Full (Time, Place, and Person)  Thought Content:  Logical  Suicidal Thoughts:  No  Homicidal Thoughts:  No  Memory:  Immediate;   Fair Recent;   Fair Remote;   Fair  Judgement:  Intact  Insight:  Fair  Psychomotor Activity:  Decreased  Concentration:  Concentration: Fair and Attention Span: Fair  Recall:  [employer] of Knowledge:  Fair  Language:  Fair  Akathisia:  Negative  Handed:  Right  AIMS (if indicated):     Assets:  Desire for Improvement Resilience  ADL's:  Impaired  Cognition:  Impaired,  Mild  Sleep:        Treatment Plan Summary: Daily contact with patient to assess and evaluate symptoms and progress in treatment, Medication management and Plan : Patient is seen and examined.  Patient is a 68 year old male with the above-stated past medical and psychiatric history who is seen by psychiatric consultation.  Disposition: No evidence of imminent risk to self or others at present.   Supportive therapy provided about ongoing stressors.   Patient is seen and examined.  Patient is a 68 year old male with the above-stated past psychiatric history seen on psychiatric consultation.  His psychiatric medicines are stable.  He continues on Wellbutrin XL but unfortunately his lorazepam and Risperdal have been left off.  He is currently not psychotic, and I would reissue his Risperdal slowly given his most recent  illness.  We will start back on 1 mg p.o. nightly and titrate that.  Additionally he has been taken lorazepam for many years, and he has been taking 1 mg during the day and 2 mg at at bedtime.  I am not sure how much of his hypertension is secondary to benzodiazepine withdrawal.  I will re-add his lorazepam on a as needed basis with 1 mg p.o. daily PRN and 1 mg p.o. nightly as needed insomnia.  He has been followed at [facility] for some time, and it would be beneficial for grief counseling after he is discharged.  Again, he is not psychotic, homicidal, suicidal and does not require inpatient treatment at this time. Sharma Covert, MD 12/11/2018 11:55 AM

## 2018-12-11 NOTE — Progress Notes (Signed)
[place] [facility] [place] NAME: Roger Pruitt    MR#:  [id number]  DATE OF BIRTH:  09/13/1950  SUBJECTIVE:    REVIEW OF SYSTEMS:   ROS Tolerating Diet: Tolerating PT:   DRUG ALLERGIES:   Allergies  Allergen Reactions  . Morphine And Related Nausea And Vomiting  . Synalgos-Dc [Aspirin-Caff-Dihydrocodeine] Itching and Other (See Comments)    Reaction:  Stinging   . Synchlor A [Chlorpheniramine] Rash and Other (See Comments)    Patient doesn't recall this allergy.    VITALS:  Blood pressure 133/83, pulse 67, temperature 97.9 F (36.6 C), resp. rate 16, height 6\' 3"  (1.905 m), weight 117.7 kg, SpO2 95 %.  PHYSICAL EXAMINATION:   Physical Exam  GENERAL:  68 y.o.-year-old patient lying in the bed with no acute distress.  EYES: Pupils equal, round, reactive to light and accommodation. No scleral icterus. Extraocular muscles intact.  HEENT: Head atraumatic, normocephalic. Oropharynx and nasopharynx clear.  NECK:  Supple, no jugular venous distention. No thyroid enlargement, no tenderness.  LUNGS: Normal breath sounds bilaterally, no wheezing, rales, rhonchi. No use of accessory muscles of respiration.  CARDIOVASCULAR: S1, S2 normal. No murmurs, rubs, or gallops.  ABDOMEN: Soft, nontender, nondistended. Bowel sounds present. No organomegaly or mass.  EXTREMITIES: No cyanosis, clubbing or edema b/l.    NEUROLOGIC: Cranial nerves II through XII are intact. No focal Motor or sensory deficits b/l.   PSYCHIATRIC:  patient is alert and oriented x 3.  SKIN: No obvious rash, lesion, or ulcer.   LABORATORY PANEL:  CBC Recent Labs  Lab 12/11/18 0730  WBC 8.3  HGB 11.8*  HCT 37.1*  PLT 175    Chemistries  Recent Labs  Lab 12/10/18 1933 12/11/18 0630  NA 141 143  K 3.7 3.9  CL 112* 111  CO2 23 25  GLUCOSE 89 99  BUN 21 18  CREATININE 0.79 0.88  CALCIUM 8.6* 8.6*  AST 14*  --   ALT 15  --   ALKPHOS 47  --   BILITOT 0.5  --     Cardiac Enzymes No results for input(s): TROPONINI in the last 168 hours. RADIOLOGY:  Ct Head Wo Contrast  Result Date: 12/10/2018 CLINICAL DATA:  Altered mental status. EXAM: CT HEAD WITHOUT CONTRAST TECHNIQUE: Contiguous axial images were obtained from the base of the skull through the vertex without intravenous contrast. COMPARISON:  12/19/2018 FINDINGS: Brain: There is no evidence of acute infarct, intracranial hemorrhage, mass, midline shift, or extra-axial fluid collection. Chronic lacunar infarcts are again seen in the basal ganglia bilaterally. Mild cerebral atrophy is not greater than expected for age. Vascular: Calcified atherosclerosis at the skull base. No hyperdense vessel. Skull: No fracture or focal osseous lesion. Sinuses/Orbits: Visualized paranasal sinuses and mastoid air cells are clear. Bilateral cataract extraction is noted. Other: None. IMPRESSION: 1. No evidence of acute intracranial abnormality. 2. Chronic basal ganglia lacunar infarcts. Electronically Signed   By: Logan Bores M.D.   On: 12/10/2018 21:12   Mr Brain Wo Contrast  Result Date: 12/10/2018 CLINICAL DATA:  68 year old male with altered mental status recently. Wife died about 2 weeks ago. EXAM: MRI HEAD WITHOUT CONTRAST TECHNIQUE: Multiplanar, multiecho pulse sequences of the brain and surrounding structures were obtained without intravenous contrast. COMPARISON:  Head CT earlier today.  Brain MRI 07/29/2015. FINDINGS: Brain: Stable cerebral volume. No restricted diffusion to suggest acute infarction. No midline shift, mass effect, evidence of mass lesion, ventriculomegaly, extra-axial collection or acute  intracranial hemorrhage. Cervicomedullary junction and pituitary are within normal limits. A small chronic lacunar infarct of the right corona radiata and basal ganglia is stable. Stable to mildly increased patchy additional nonspecific cerebral white matter T2 and FLAIR hyperintensity in both hemispheres. No cortical  encephalomalacia or chronic cerebral blood products. Stable mild T2 heterogeneity elsewhere in the deep gray nuclei. Vascular: Major intracranial vascular flow voids appear stable since 2017. generalized intracranial artery tortuosity. Skull and upper cervical spine: Negative visible cervical spine. Visualized bone marrow signal is within normal limits. Sinuses/Orbits: Postoperative changes to both globes since 2017. Otherwise stable and negative. Other: Mastoids remain clear. Visible internal auditory structures appear normal. Scalp and face soft tissues appear negative. IMPRESSION: No acute intracranial abnormality. Stable non-contrast MRI appearance of the brain since 2017 with mild for age chronic small vessel disease. Electronically Signed   By: Genevie Ann M.D.   On: 12/10/2018 22:59   Ct Abdomen Pelvis W Contrast  Result Date: 12/10/2018 CLINICAL DATA:  Abdominal pain decreased LOC EXAM: CT ABDOMEN AND PELVIS WITH CONTRAST TECHNIQUE: Multidetector CT imaging of the abdomen and pelvis was performed using the standard protocol following bolus administration of intravenous contrast. CONTRAST:  185mL OMNIPAQUE IOHEXOL 300 MG/ML  SOLN COMPARISON:  CT 06/05/2015 FINDINGS: Lower chest: Patchy dependent atelectasis at the posterior lung bases. Mild ground-glass opacity incompletely visualized in the posterior right lower lobe. Borderline to mild cardiomegaly. Coronary vascular calcification Hepatobiliary: No focal liver abnormality is seen. No gallstones, gallbladder wall thickening, or biliary dilatation. Pancreas: Unremarkable. No pancreatic ductal dilatation or surrounding inflammatory changes. Spleen: Normal in size without focal abnormality. Adrenals/Urinary Tract: Adrenal glands are normal. Nonspecific perinephric fat stranding. No hydronephrosis. Small cyst mid left kidney. Punctate nonobstructing stones within the kidneys. Marked urinary bladder distention Stomach/Bowel: Stomach is within normal limits.  Appendix appears normal. No evidence of bowel wall thickening, distention, or inflammatory changes. Large volume of stool in the colon. Vascular/Lymphatic: Extensive aortic atherosclerosis. No aneurysm. No significantly enlarged lymph nodes. Reproductive: Status post TURP Other: Negative for free air or free fluid. Small fat containing left inguinal hernia Musculoskeletal: Degenerative changes. No acute or suspicious abnormality. IMPRESSION: 1. Incompletely visualized ground-glass density in the posterior right lower lobe, possible pneumonia, to include atypical or viral etiology 2. Marked urinary bladder distention, possible outlet obstruction. Post TURP changes of the prostate 3. Nonobstructing kidney stones Electronically Signed   By: Donavan Foil M.D.   On: 12/10/2018 21:31   Dg Chest Portable 1 View  Result Date: 12/10/2018 CLINICAL DATA:  Altered mental status, atelectatic changes on recent CT EXAM: PORTABLE CHEST 1 VIEW COMPARISON:  09/05/2018, CT from earlier in the same day. FINDINGS: Cardiac shadow is enlarged but stable. Postsurgical changes are again seen. Mild right basilar atelectatic changes are noted stable from the prior CT. No other focal abnormality is noted. IMPRESSION: Right basilar atelectatic changes. Electronically Signed   By: Inez Catalina M.D.   On: 12/10/2018 23:33   ASSESSMENT AND PLAN:   68 year old male with past medical hx of Bipolar Disorder, GERD, HTN, HLD, CAD, MI, Depression, iron deficiency anemia, CVA with residual deficit, Diabetes Mellitus, and sleep apnea presenting to the ED with altered mental status   1. Altered mental status -etiology likely secondary to polypharmacy in a patient with recent similar presentation found to be taking benzos and tricyclics and opiates -pt is alert and oreinted x2 -CT head negative for acute intracranial abnormality -Follow-up MRI of the brain also negative with no acute  finding -UDS positive for tricyclics and  benzo -appreciate psychiatry input. Recommends continue 1 mg PO QHS Risperdal and since patient has been on Ativan for many years continue 1 mg daily PRN and 1 mg Q HS.  2. Recent UTI -currently UA does not appear to be abnormal. Al DC IV antibiotics.  3.Elevated troponin -likely due to demand ischemia, no dynamic EKG changes -hx of CAD no acute EKG changes. Troponin is flat. DC IV heparin drip -Continue Aspirin  - HTN, HLD, DM control as below  4. HLD - + Goal LDL<100 - Atorvastatin 40mg  PO qhs  5. HTN -hypertensive on presentation + Goal BP <130/80 - Continue Coreg, amlodipine, hydralazine, lisinopril - Also noted with markedly urinary bladder distention, possible outlet obstruction - We will continue his Flomax and place Foley catheter  6. Diabetes Mellitus Type 2 with complications - Hold Janumet - CBG monitoring - SSI  7. Bipolar disorder with depression -- Continue Wellbutrin, Risperdal and Ativan as outlined by psychiatry - Psychiatric consult, patient recently lost his wife, neighbors's not dealing well with his loss -appreciate psychiatry input.  4. DVT prophylaxis - Enoxaparin SubQ  I spoke with patient's caregiver/neighbor Malachy Mood [phone] and updated her patient has multiple medical problems. He lives by himself and neighbor helps him out. Discussed discharge planning for possible rehab. Patient declined that. He wants to go home with home health.  I believe  he is estranged from his children. Palliative consultation placed. Patient will benefit from outpatient palliative care follow-up  Case discussed with Care Management/Social Worker. Management plans discussed with the patient, family and they are in agreement.  CODE STATUS: DNR  DVT Prophylaxis: lovenox  TOTAL TIME TAKING CARE OF THIS PATIENT: 30  minutes.  >50% time spent on counselling and coordination of care  POSSIBLE D/C IN *1-2* DAYS, DEPENDING ON CLINICAL CONDITION.  Note: This  dictation was prepared with Dragon dictation along with smaller phrase technology. Any transcriptional errors that result from this process are unintentional.  Fritzi Mandes M.D on 12/11/2018 at 3:27 PM  Between 7am to 6pm - Pager - [phone]  After 6pm go to [email or web address] - password [email or web address] [facility]  Office  [phone]  CC: Primary care physician; Casilda Carls, MDPatient ID: Marlowe Aschoff, male   DOB: [birth year], 68 y.o.   MRN: [id number]

## 2018-12-11 NOTE — ED Notes (Signed)
ED TO INPATIENT HANDOFF REPORT  ED Nurse Name and Phone #: Antonieta Pert, RN [phone]  S Name/Age/Gender Roger Pruitt 68 y.o. male Room/Bed: ED03A/ED03A  Code Status   Code Status: DNR  Home/SNF/Other Home Patient oriented to: self and place Is this baseline? No   Triage Complete: Triage complete  Chief Complaint Altered Mental Status  Triage Note Pt presents w/ decreased level of consciousness and AMS. Pt arrives from home where he lives alone. Recent loss of wife. Pt able to verify name but no other orientation questions. Pt denies overdose. Pt remains altered after narcan administration,.    Allergies Allergies  Allergen Reactions  . Morphine And Related Nausea And Vomiting  . Synalgos-Dc [Aspirin-Caff-Dihydrocodeine] Itching and Other (See Comments)    Reaction:  Stinging   . Synchlor A [Chlorpheniramine] Rash and Other (See Comments)    Patient doesn't recall this allergy.    Level of Care/Admitting Diagnosis ED Disposition    ED Disposition Condition [facility] Area: [place] [100120]  Level of Care: Telemetry [5]  Covid Evaluation: Asymptomatic Screening Protocol (No Symptoms)  Diagnosis: Altered mental status [780.97.ICD-9-CM]  Admitting Physician: Rufina Falco [place]  Attending Physician: Rufina Falco ACHIENG [id number]  Estimated length of stay: past midnight tomorrow  Certification:: I certify this patient will need inpatient services for at least 2 midnights  PT Class (Do Not Modify): Inpatient [101]  PT Acc Code (Do Not Modify): Private [1]       B Medical/Surgery History Past Medical History:  Diagnosis Date  . Abdominal pain   . Acute cystitis without hematuria   . Anemia   . Anginal pain ([place])    rarely  . Arthritis    knees and hands  . Asthma   . Bipolar disorder ([place])    BIPOLAR/ SCHIZO/ AFFECTIVE   . BPD (bronchopulmonary dysplasia)   . BPH (benign prostatic hyperplasia)   .  CAD (coronary artery disease)    a. s/p MI 2001, 2012;  b. s/p prior LCX stenting; b. 05/2012 Abnl MV; c. 05/2012 Cath: patent LCX stent w/ otw mod non-obs dzs->Med Rx.  . Carpal tunnel syndrome    surgery both hands  . Depression   . Disc degeneration, lumbar   . Dyspnea    WITH EXERTION  . Elevated PSA   . Esophageal reflux   . GERD (gastroesophageal reflux disease)   . Hematuria    history of gross and microscopic  . Hemorrhoid   . History of balanitis   . History of bladder infections   . History of echocardiogram    a. 07/2014 Echo: EF 60-65%, no rwma, nl LA size, nl RV/PASP.  Marland Kitchen History of urethral stricture   . Hyperlipidemia   . Hypertension    controlled  . Hypertensive heart disease   . Incomplete bladder emptying   . Iron deficiency anemia, unspecified   . Lower urinary tract symptoms   . Myocardial infarction ([place])    X7 LAST 2001  . Neuropathy   . Nocturia   . Obsessive compulsive disorder   . Other dysphagia   . Panic attack   . Parkinson's disease ([place])   . Sleep apnea    SURGICAL TX  . Stroke ([place])   . Type II diabetes mellitus ([place])   . Unspecified vitamin D deficiency   . Urethral stricture   . Urinary retention   . Varicose vein of leg   . Vertigo  hx of  . Vitamin D deficiency   . Wears dentures    upper and lowers   Past Surgical History:  Procedure Laterality Date  . BACK SURGERY     lumbar disc  . BACK SURGERY  1984  . CARDIAC CATHETERIZATION  6/10;2011;Aug.2012;Oct. 2012;   . CARDIAC CATHETERIZATION  05/2012   [facility]; EF 60%, patent stent in the left circumflex, moderate three-vessel disease with no flow-limiting lesions. Unchanged from most recent catheterization  . CARDIAC CATHETERIZATION N/A 12/10/2015   Procedure: Left Heart Cath and Coronary Angiography;  Surgeon: Corey Skains, MD;  Location: [place] [facility];  Service: Cardiovascular;  Laterality: N/A;  . CARPAL TUNNEL RELEASE  2009   left  . CATARACT EXTRACTION W/PHACO Left  02/14/2017   Procedure: CATARACT EXTRACTION PHACO AND INTRAOCULAR LENS PLACEMENT (IOC);  Surgeon: Birder Robson, MD;  Location: [facility] OR[facility];  Service: Ophthalmology;  Laterality: Left;  [place] 00:42 AP% 16.6 CDE 7.14 Fluid pack lot # [id number]  . CATARACT EXTRACTION W/PHACO Right 02/28/2017   Procedure: CATARACT EXTRACTION PHACO AND INTRAOCULAR LENS PLACEMENT (IOC);  Surgeon: Birder Robson, MD;  Location: [facility] OR[facility];  Service: Ophthalmology;  Laterality: Right;  [place] 01:09 AP% 11.4 CDE 7.98 Fluid pack lot # [id number]  . COLONOSCOPY WITH PROPOFOL N/A 06/29/2017   Procedure: COLONOSCOPY WITH PROPOFOL;  Surgeon: [name], MD;  Location: [place];  Service: Endoscopy;  Laterality: N/A;  Diabetic - oral meds  . CORONARY ANGIOPLASTY  2011 & 2001   s/p stent  . CORONARY ARTERY BYPASS GRAFT     stent placement  . CORONARY ARTERY BYPASS GRAFT N/A 12/25/2015   Procedure: CORONARY ARTERY BYPASS GRAFTING (CABG) x4  , using left internal mammary artery and right leg greater saphenous vein harvested endoscopically LIMA-LAD SVG-OM2 SVG-DIAG SVG-PD;  Surgeon: Melrose Nakayama, MD;  Location: [place];  Service: Open Heart Surgery;  Laterality: N/A;  . [place]  . CYST REMOVAL LEG Right   . ESOPHAGOGASTRODUODENOSCOPY  06/29/2017   Procedure: ESOPHAGOGASTRODUODENOSCOPY (EGD);  Surgeon: [name], MD;  Location: [place];  Service: Endoscopy;;  . POLYPECTOMY  06/29/2017   Procedure: POLYPECTOMY INTESTINAL;  Surgeon: [name], MD;  Location: [place];  Service: Endoscopy;;  . SKIN GRAFT    . TEE WITHOUT CARDIOVERSION N/A 12/25/2015   Procedure: TRANSESOPHAGEAL ECHOCARDIOGRAM (TEE);  Surgeon: Melrose Nakayama, MD;  Location: [place];  Service: Open Heart Surgery;  Laterality: N/A;  . TONSILLECTOMY    . UVULOPALATOPHARYNGOPLASTY       A IV Location/Drains/Wounds Patient Lines/Drains/Airways Status   Active Line/Drains/Airways    Name:   Placement date:    Placement time:   Site:   Days:   Incision (Closed) 02/14/17 Eye Left   02/14/17    0757     665   Incision (Closed) 02/28/17 Eye Right   02/28/17    0925     651   Incision (Closed) 06/29/17 Rectum   06/29/17    0810     530   Incision (Closed) 06/29/17 Throat   06/29/17    0810     530          Intake/Output Last 24 hours No intake or output data in the 24 hours ending 12/11/18 0003  Labs/Imaging Results for orders placed or performed during the hospital encounter of 12/10/18 (from the past 48 hour(s))  Glucose, capillary     Status: None   Collection Time: 12/10/18  7:22 PM  Result Value Ref Range   Glucose-Capillary 82 70 - 99 mg/dL  Comprehensive metabolic panel     Status: Abnormal   Collection Time: 12/10/18  7:33 PM  Result Value Ref Range   Sodium 141 135 - 145 mmol/L   Potassium 3.7 3.5 - 5.1 mmol/L   Chloride 112 (H) 98 - 111 mmol/L   CO2 23 22 - 32 mmol/L   Glucose, Bld 89 70 - 99 mg/dL   BUN 21 8 - 23 mg/dL   Creatinine, Ser 0.79 0.61 - 1.24 mg/dL   Calcium 8.6 (L) 8.9 - 10.3 mg/dL   Total Protein 5.9 (L) 6.5 - 8.1 g/dL   Albumin 3.5 3.5 - 5.0 g/dL   AST 14 (L) 15 - 41 U/L   ALT 15 0 - 44 U/L   Alkaline Phosphatase 47 38 - 126 U/L   Total Bilirubin 0.5 0.3 - 1.2 mg/dL   GFR calc non Af Amer >60 >60 mL/min   GFR calc Af Amer >60 >60 mL/min   Anion gap 6 5 - 15    Comment: Performed at [facility], [address] [place], [place] [address]  Lipase, blood     Status: None   Collection Time: 12/10/18  7:33 PM  Result Value Ref Range   Lipase 31 11 - 51 U/L    Comment: Performed at [facility], [address] [place], [place] [address]  Troponin I (High Sensitivity)     Status: Abnormal   Collection Time: 12/10/18  7:33 PM  Result Value Ref Range   Troponin I (High Sensitivity) 150 (HH) <18 ng/L    Comment: CRITICAL RESULT CALLED TO, READ BACK BY AND VERIFIED WITH BUTCH WOODS AT 2024 12/10/2018  TFK (NOTE) Elevated high sensitivity  troponin I (hsTnI) values and significant  changes across serial measurements may suggest ACS but many other  chronic and acute conditions are known to elevate hsTnI results.  Refer to the "Links" section for chest pain algorithms and additional  guidance. Performed at [facility], [address] [place], [place] [address]   Lactic acid, plasma     Status: None   Collection Time: 12/10/18  7:33 PM  Result Value Ref Range   Lactic Acid, Venous 1.4 0.5 - 1.9 mmol/L    Comment: Performed at [facility], [address] [place], [place] [address]  Brain natriuretic peptide     Status: Abnormal   Collection Time: 12/10/18  7:33 PM  Result Value Ref Range   B Natriuretic Peptide 246.0 (H) 0.0 - 100.0 pg/mL    Comment: Performed at [facility], [address] [place], [place] [address]  Ethanol     Status: None   Collection Time: 12/10/18  7:33 PM  Result Value Ref Range   Alcohol, Ethyl (B) <10 <10 mg/dL    Comment: (NOTE) Lowest detectable limit for serum alcohol is 10 mg/dL. For medical purposes only. Performed at [facility], [address], [place], [place] [address]   Acetaminophen level     Status: Abnormal   Collection Time: 12/10/18  7:33 PM  Result Value Ref Range   Acetaminophen (Tylenol), Serum <10 (L) 10 - 30 ug/mL    Comment: (NOTE) Therapeutic concentrations vary significantly. A range of 10-30 ug/mL  may be an effective concentration for many patients. However, some  are best treated at concentrations outside of this range. Acetaminophen concentrations >150 ug/mL at 4 hours after ingestion  and >50 ug/mL  at 12 hours after ingestion are often associated with  toxic reactions. Performed at [facility], [address] [place], [place] [id number]   Salicylate level     Status: None   Collection Time: 12/10/18  7:33 PM  Result Value Ref Range   Salicylate Lvl [id number] 2.8 - 30.0 mg/dL    Comment: Performed at  [facility], [address] [place], [place] [address]  CBC with Differential     Status: Abnormal   Collection Time: 12/10/18  7:33 PM  Result Value Ref Range   WBC 7.1 4.0 - 10.5 K/uL   RBC 3.73 (L) 4.22 - 5.81 MIL/uL   Hemoglobin 10.8 (L) 13.0 - 17.0 g/dL   HCT 33.6 (L) 39.0 - 52.0 %   MCV 90.1 80.0 - 100.0 fL   MCH 29.0 26.0 - 34.0 pg   MCHC 32.1 30.0 - 36.0 g/dL   RDW 13.7 11.5 - 15.5 %   Platelets 197 150 - 400 K/uL   nRBC 0.0 0.0 - 0.2 %   Neutrophils Relative % 66 %   Neutro Abs 4.7 1.7 - 7.7 K/uL   Lymphocytes Relative 21 %   Lymphs Abs 1.5 0.7 - 4.0 K/uL   Monocytes Relative 7 %   Monocytes Absolute 0.5 0.1 - 1.0 K/uL   Eosinophils Relative 4 %   Eosinophils Absolute 0.3 0.0 - 0.5 K/uL   Basophils Relative 1 %   Basophils Absolute 0.1 0.0 - 0.1 K/uL   Immature Granulocytes 1 %   Abs Immature Granulocytes 0.08 (H) 0.00 - 0.07 K/uL    Comment: Performed at [facility], [address] [place], [place] [address]  Urinalysis, Complete w Microscopic     Status: Abnormal   Collection Time: 12/10/18  7:33 PM  Result Value Ref Range   Color, Urine STRAW (A) YELLOW   APPearance CLEAR (A) CLEAR   Specific Gravity, Urine 1.005 1.005 - 1.030   pH 6.0 5.0 - 8.0   Glucose, UA NEGATIVE NEGATIVE mg/dL   Hgb urine dipstick NEGATIVE NEGATIVE   Bilirubin Urine NEGATIVE NEGATIVE   Ketones, ur NEGATIVE NEGATIVE mg/dL   Protein, ur NEGATIVE NEGATIVE mg/dL   Nitrite NEGATIVE NEGATIVE   Leukocytes,Ua SMALL (A) NEGATIVE   RBC / HPF 0-5 0 - 5 RBC/hpf   WBC, UA 6-10 0 - 5 WBC/hpf   Bacteria, UA NONE SEEN NONE SEEN   Squamous Epithelial / LPF 0-5 0 - 5    Comment: Performed at [facility], [address] [place], [place] [address]  Lactic acid, plasma     Status: None   Collection Time: 12/10/18  9:29 PM  Result Value Ref Range   Lactic Acid, Venous 1.0 0.5 - 1.9 mmol/L    Comment: Performed at [facility], [place], [place] [address]  Troponin I (High Sensitivity)     Status: Abnormal   Collection Time: 12/10/18  9:29 PM  Result Value Ref Range   Troponin I (High Sensitivity) 144 (HH) <18 ng/L    Comment: CRITICAL VALUE NOTED. VALUE IS CONSISTENT WITH PREVIOUSLY REPORTED/CALLED VALUE SNG (NOTE) Elevated high sensitivity troponin I (hsTnI) values and significant  changes across serial measurements may suggest ACS but many other  chronic and acute conditions are known to elevate hsTnI results.  Refer to the "Links" section for chest pain algorithms and additional  guidance. Performed at [facility], [address], [place], [place] [address]   Glucose, capillary  Status: None   Collection Time: 12/10/18 11:49 PM  Result Value Ref Range   Glucose-Capillary 84 70 - 99 mg/dL   Ct Head Wo Contrast  Result Date: 12/10/2018 CLINICAL DATA:  Altered mental status. EXAM: CT HEAD WITHOUT CONTRAST TECHNIQUE: Contiguous axial images were obtained from the base of the skull through the vertex without intravenous contrast. COMPARISON:  12/21/2018 FINDINGS: Brain: There is no evidence of acute infarct, intracranial hemorrhage, mass, midline shift, or extra-axial fluid collection. Chronic lacunar infarcts are again seen in the basal ganglia bilaterally. Mild cerebral atrophy is not greater than expected for age. Vascular: Calcified atherosclerosis at the skull base. No hyperdense vessel. Skull: No fracture or focal osseous lesion. Sinuses/Orbits: Visualized paranasal sinuses and mastoid air cells are clear. Bilateral cataract extraction is noted. Other: None. IMPRESSION: 1. No evidence of acute intracranial abnormality. 2. Chronic basal ganglia lacunar infarcts. Electronically Signed   By: Logan Bores M.D.   On: 12/10/2018 21:12   Mr Brain Wo Contrast  Result Date: 12/10/2018 CLINICAL DATA:  68 year old male with altered mental status recently. Wife died about 2 weeks ago. EXAM: MRI HEAD WITHOUT  CONTRAST TECHNIQUE: Multiplanar, multiecho pulse sequences of the brain and surrounding structures were obtained without intravenous contrast. COMPARISON:  Head CT earlier today.  Brain MRI 07/29/2015. FINDINGS: Brain: Stable cerebral volume. No restricted diffusion to suggest acute infarction. No midline shift, mass effect, evidence of mass lesion, ventriculomegaly, extra-axial collection or acute intracranial hemorrhage. Cervicomedullary junction and pituitary are within normal limits. A small chronic lacunar infarct of the right corona radiata and basal ganglia is stable. Stable to mildly increased patchy additional nonspecific cerebral white matter T2 and FLAIR hyperintensity in both hemispheres. No cortical encephalomalacia or chronic cerebral blood products. Stable mild T2 heterogeneity elsewhere in the deep gray nuclei. Vascular: Major intracranial vascular flow voids appear stable since 2017. generalized intracranial artery tortuosity. Skull and upper cervical spine: Negative visible cervical spine. Visualized bone marrow signal is within normal limits. Sinuses/Orbits: Postoperative changes to both globes since 2017. Otherwise stable and negative. Other: Mastoids remain clear. Visible internal auditory structures appear normal. Scalp and face soft tissues appear negative. IMPRESSION: No acute intracranial abnormality. Stable non-contrast MRI appearance of the brain since 2017 with mild for age chronic small vessel disease. Electronically Signed   By: Genevie Ann M.D.   On: 12/10/2018 22:59   Ct Abdomen Pelvis W Contrast  Result Date: 12/10/2018 CLINICAL DATA:  Abdominal pain decreased LOC EXAM: CT ABDOMEN AND PELVIS WITH CONTRAST TECHNIQUE: Multidetector CT imaging of the abdomen and pelvis was performed using the standard protocol following bolus administration of intravenous contrast. CONTRAST:  174mL OMNIPAQUE IOHEXOL 300 MG/ML  SOLN COMPARISON:  CT 06/05/2015 FINDINGS: Lower chest: Patchy dependent  atelectasis at the posterior lung bases. Mild ground-glass opacity incompletely visualized in the posterior right lower lobe. Borderline to mild cardiomegaly. Coronary vascular calcification Hepatobiliary: No focal liver abnormality is seen. No gallstones, gallbladder wall thickening, or biliary dilatation. Pancreas: Unremarkable. No pancreatic ductal dilatation or surrounding inflammatory changes. Spleen: Normal in size without focal abnormality. Adrenals/Urinary Tract: Adrenal glands are normal. Nonspecific perinephric fat stranding. No hydronephrosis. Small cyst mid left kidney. Punctate nonobstructing stones within the kidneys. Marked urinary bladder distention Stomach/Bowel: Stomach is within normal limits. Appendix appears normal. No evidence of bowel wall thickening, distention, or inflammatory changes. Large volume of stool in the colon. Vascular/Lymphatic: Extensive aortic atherosclerosis. No aneurysm. No significantly enlarged lymph nodes. Reproductive: Status post TURP Other: Negative for free air or  free fluid. Small fat containing left inguinal hernia Musculoskeletal: Degenerative changes. No acute or suspicious abnormality. IMPRESSION: 1. Incompletely visualized ground-glass density in the posterior right lower lobe, possible pneumonia, to include atypical or viral etiology 2. Marked urinary bladder distention, possible outlet obstruction. Post TURP changes of the prostate 3. Nonobstructing kidney stones Electronically Signed   By: Donavan Foil M.D.   On: 12/10/2018 21:31   Dg Chest Portable 1 View  Result Date: 12/10/2018 CLINICAL DATA:  Altered mental status, atelectatic changes on recent CT EXAM: PORTABLE CHEST 1 VIEW COMPARISON:  09/05/2018, CT from earlier in the same day. FINDINGS: Cardiac shadow is enlarged but stable. Postsurgical changes are again seen. Mild right basilar atelectatic changes are noted stable from the prior CT. No other focal abnormality is noted. IMPRESSION: Right basilar  atelectatic changes. Electronically Signed   By: Inez Catalina M.D.   On: 12/10/2018 23:33    Pending Labs Unresulted Labs (From admission, onward)    Start     Ordered   12/10/18 2208  SARS Coronavirus 2 [facility] order, Performed in [facility] hospital lab) Nasopharyngeal Nasopharyngeal Swab  (Symptomatic/High Risk of Exposure/Tier 1 Patients Labs with Precautions)  Once,   STAT    Question Answer Comment  Is this test for diagnosis or screening Diagnosis of ill patient   Symptomatic for COVID-19 as defined by CDC Yes   Date of Symptom Onset 12/10/2018   Hospitalized for COVID-19 Yes   Admitted to ICU for COVID-19 No   Previously tested for COVID-19 Yes   Resident in a congregate (group) care setting No   Employed in healthcare setting No      12/10/18 2207   12/10/18 2152  HIV antibody (Routine Testing)  Once,   STAT     12/10/18 2154   12/10/18 1915  Urine Drug Screen, Qualitative  Once,   STAT     12/10/18 1914          Vitals/Pain Today's Vitals   12/10/18 2215 12/10/18 2313 12/10/18 2315 12/10/18 2351  BP: (!) 162/109 (!) 197/118 (!) 188/113 (!) 172/107  Pulse:  63 63 62  Resp: 18 18 19 18   Temp:      TempSrc:      SpO2:  100% 99% 100%  Weight:      Height:      PainSc:    0-No pain    Isolation Precautions Airborne and Contact precautions  Medications Medications  enoxaparin (LOVENOX) injection 40 mg (has no administration in time range)  0.9 %  sodium chloride infusion (has no administration in time range)  amLODipine (NORVASC) tablet 10 mg (has no administration in time range)  carvedilol (COREG) tablet 12.5 mg (has no administration in time range)  doxepin (SINEQUAN) capsule 50 mg (has no administration in time range)  gabapentin (NEURONTIN) capsule 300 mg (has no administration in time range)  hydrALAZINE (APRESOLINE) tablet 25 mg (has no administration in time range)  isosorbide mononitrate (IMDUR) 24 hr tablet 30 mg (has no administration in time  range)  sitaGLIPtin-metformin (JANUMET) 50-1000 MG per tablet 1 tablet (has no administration in time range)  pantoprazole (PROTONIX) EC tablet 40 mg (has no administration in time range)  topiramate (TOPAMAX) tablet 100 mg (has no administration in time range)  risperiDONE (RISPERDAL) tablet 2 mg (has no administration in time range)  naloxone [facility]) injection 0.4 mg (0.4 mg Intravenous Given 12/10/18 1918)  nitroGLYCERIN (NITROGLYN) 2 % ointment 1 inch (1 inch Topical Given 12/10/18 2044)  clopidogrel (PLAVIX) tablet 300 mg (300 mg Oral Given 12/10/18 2116)  iohexol (OMNIPAQUE) 300 MG/ML solution 100 mL (100 mLs Intravenous Contrast Given 12/10/18 2049)  nitroGLYCERIN (NITROGLYN) 2 % ointment 0.5 inch (0.5 inches Topical Given 12/10/18 2323)    Mobility walks with device High fall risk   Focused Assessments Neuro Assessment Handoff:  Swallow screen pass? No    NIH Stroke Scale ( + Modified Stroke Scale Criteria)  Interval: Initial Level of Consciousness (1a.)   : Not alert, requires repeated stimulation to attend, or is obtunded and requires strong or painful stimulation to make movements (not stereotyped) LOC Questions (1b. )   +: Answers both questions correctly LOC Commands (1c. )   + : Performs both tasks correctly Best Gaze (2. )  +: Normal Visual (3. )  +: No visual loss Facial Palsy (4. )    : Normal symmetrical movements Motor Arm, Left (5a. )   +: No drift Motor Arm, Right (5b. )   +: No drift Motor Leg, Left (6a. )   +: No drift Motor Leg, Right (6b. )   +: No drift Limb Ataxia (7. ): Amputation or joint fusion Sensory (8. )   +: Normal, no sensory loss Best Language (9. )   +: Severe aphasia Dysarthria (10. ): Intubated or other physical barrier Extinction/Inattention (11.)   +: No Abnormality Modified SS Total  +: 2 Last date known well: 12/09/18   Neuro Assessment: Exceptions to WDL Neuro Checks:   Initial (12/10/18 1909)  Last Documented NIHSS Modified Score:  2 (12/10/18 2310) Has TPA been given? No If patient is a Neuro Trauma and patient is going to OR before floor call report to [place] nurse: [phone] or [phone]     R Recommendations: See Admitting Provider Note  Report given to:   Additional Notes: Pt has [place] of stroke

## 2018-12-11 NOTE — Progress Notes (Signed)
ANTICOAGULATION CONSULT NOTE - Initial Consult  Pharmacy Consult for Heparin Indication: chest pain/ACS  Allergies  Allergen Reactions  . Morphine And Related Nausea And Vomiting  . Synalgos-Dc [Aspirin-Caff-Dihydrocodeine] Itching and Other (See Comments)    Reaction:  Stinging   . Synchlor A [Chlorpheniramine] Rash and Other (See Comments)    Patient doesn't recall this allergy.   Patient Measurements: Height: 6\' 3"  (190.5 cm) Weight: 259 lb 8 oz (117.7 kg) IBW/kg (Calculated) : 84.5 HEPARIN DW (KG): 109.3  Vital Signs: Temp: 97.6 F (36.4 C) (09/15 0404) Temp Source: Oral (09/15 0404) BP: 172/103 (09/15 0404) Pulse Rate: 65 (09/15 0404)  Labs: Recent Labs    12/10/18 1933 12/10/18 2129  HGB 10.8*  --   HCT 33.6*  --   PLT 197  --   CREATININE 0.79  --   TROPONINIHS 150* 144*   Estimated Creatinine Clearance: 123.9 mL/min (by C-G formula based on SCr of 0.79 mg/dL).  Medical History: Past Medical History:  Diagnosis Date  . Abdominal pain   . Acute cystitis without hematuria   . Anemia   . Anginal pain ([place])    rarely  . Arthritis    knees and hands  . Asthma   . Bipolar disorder ([place])    BIPOLAR/ SCHIZO/ AFFECTIVE   . BPD (bronchopulmonary dysplasia)   . BPH (benign prostatic hyperplasia)   . CAD (coronary artery disease)    a. s/p MI 2001, 2012;  b. s/p prior LCX stenting; b. 05/2012 Abnl MV; c. 05/2012 Cath: patent LCX stent w/ otw mod non-obs dzs->Med Rx.  . Carpal tunnel syndrome    surgery both hands  . Depression   . Disc degeneration, lumbar   . Dyspnea    WITH EXERTION  . Elevated PSA   . Esophageal reflux   . GERD (gastroesophageal reflux disease)   . Hematuria    history of gross and microscopic  . Hemorrhoid   . History of balanitis   . History of bladder infections   . History of echocardiogram    a. 07/2014 Echo: EF 60-65%, no rwma, nl LA size, nl RV/PASP.  Marland Kitchen History of urethral stricture   . Hyperlipidemia   . Hypertension    controlled  . Hypertensive heart disease   . Incomplete bladder emptying   . Iron deficiency anemia, unspecified   . Lower urinary tract symptoms   . Myocardial infarction ([place])    X7 LAST 2001  . Neuropathy   . Nocturia   . Obsessive compulsive disorder   . Other dysphagia   . Panic attack   . Parkinson's disease ([place])   . Sleep apnea    SURGICAL TX  . Stroke ([place])   . Type II diabetes mellitus ([place])   . Unspecified vitamin D deficiency   . Urethral stricture   . Urinary retention   . Varicose vein of leg   . Vertigo    hx of  . Vitamin D deficiency   . Wears dentures    upper and lowers    Medications:  Medications Prior to Admission  Medication Sig Dispense Refill Last Dose  . acetaminophen (TYLENOL) 500 MG tablet Take 1,000 mg by mouth every 6 (six) hours as needed for moderate pain or headache.    prn at prn  . aspirin (ASPIRIN 81) 81 MG EC tablet Take 81 mg by mouth daily.    Unknown at Unknown  . atorvastatin (LIPITOR) 40 MG tablet Take 1 tablet (40 mg total)  by mouth daily at 6 PM. 30 tablet 3 Unknown at Unknown  . budesonide-formoterol (SYMBICORT) 160-4.5 MCG/ACT inhaler Inhale 2 puffs into the lungs 2 (two) times daily. 1 Inhaler 1 Unknown at Unknown  . buPROPion (WELLBUTRIN XL) 300 MG 24 hr tablet Take 300 mg by mouth daily. noon   Unknown at Unknown  . carvedilol (COREG) 12.5 MG tablet Take 12.5 mg by mouth 2 (two) times daily.    Unknown at Unknown  . ciprofloxacin (CIPRO) 500 MG tablet Take 1 tablet (500 mg total) by mouth 2 (two) times daily for 10 days. 20 tablet 0 Unknown at Unknown  . doxepin (SINEQUAN) 25 MG capsule Take 50 mg by mouth at bedtime.    Unknown at Unknown  . gabapentin (NEURONTIN) 300 MG capsule Take 300 mg by mouth 3 (three) times daily.    Unknown at Unknown  . hydrALAZINE (APRESOLINE) 25 MG tablet Take 1 tablet (25 mg total) by mouth every 8 (eight) hours. 60 tablet 0 Unknown at Unknown  . Ipratropium-Albuterol (COMBIVENT RESPIMAT) 20-100  MCG/ACT AERS respimat INHALE ONE PUFF INTO THE LUNGS EVERY 6 HOURS (Patient taking differently: Inhale 1 puff into the lungs every 6 (six) hours. ) 4 g 5 Unknown at Unknown  . isosorbide mononitrate (IMDUR) 30 MG 24 hr tablet Take 30 mg by mouth daily.   Unknown at Unknown  . JANUMET 50-1000 MG tablet Take 1 tablet by mouth 2 (two) times daily. Am and pm   Unknown at Unknown  . lisinopril (PRINIVIL,ZESTRIL) 20 MG tablet Take 20 mg by mouth 2 (two) times daily.    Unknown at Unknown  . LORazepam (ATIVAN) 1 MG tablet Take 1-2 tablets (1-2 mg total) by mouth 2 (two) times daily. Pt takes one tablet in the morning and two at bedtime. (Patient taking differently: Take 1-2 mg by mouth See admin instructions. Take 1 mg by mouth in the morning and take 2 mg by mouth at bedtime) 30 tablet 0 Unknown at Unknown  . pantoprazole (PROTONIX) 40 MG tablet Take 40 mg by mouth daily.    Unknown at Unknown  . risperidone (RISPERDAL) 4 MG tablet Take 0.5 tablets (2 mg total) by mouth at bedtime. 30 tablet 1 Unknown at Unknown  . tamsulosin (FLOMAX) 0.4 MG CAPS capsule Take 1 capsule (0.4 mg total) by mouth daily. 90 capsule 3 Unknown at Unknown  . topiramate (TOPAMAX) 100 MG tablet Take 100 mg by mouth daily at 12 noon.    Unknown at Unknown  . traMADol (ULTRAM) 50 MG tablet Take 1 tablet (50 mg total) by mouth every 8 (eight) hours as needed. (Patient taking differently: Take 50 mg by mouth every 12 (twelve) hours as needed for moderate pain. ) 15 tablet 0 prn at prn  . amLODipine (NORVASC) 10 MG tablet Take 10 mg by mouth daily. pm   Not Taking at Unknown time  . fluticasone (FLONASE) 50 MCG/ACT nasal spray Place 2 sprays into both nostrils daily. Am and pm (Patient not taking: Reported on 12/10/2018) 16 g 2 Not Taking at Unknown time  . nitroGLYCERIN (NITROLINGUAL) 0.4 MG/SPRAY spray Place 1 spray under the tongue every 5 (five) minutes x 3 doses as needed for chest pain.   Not Taking at prn    Assessment: Asked to  initiate Heparin for ACS.  No anticoagulants PTA per med list.  Lovenox was previously ordered but was not given (not charted).  Baseline labs pending.  Goal of Therapy:  Heparin level 0.3-0.7 units/ml  Monitor platelets by anticoagulation protocol: Yes   Plan:   Heparin 4000 units IV bolus now x 1  Heparin infusion at 1300 units/hr  Check heparin level 6 hours after infusion started  Hart Robinsons A 12/11/2018,6:48 AM
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
## 2018-12-11 NOTE — TOC Initial Note (Signed)
Transition of Care [facility]) - Initial/Assessment Note    Patient Details  Name: Roger Pruitt MRN: [id number] Date of Birth: [birth year]  [facility] CM/SW Contact:    Shade Flood, LCSW Phone Number: 12/11/2018, 12:17 PM  Clinical Narrative:                  Pt admitted from home. He has a prior history of Parkinson's and CVA. Pt's wife recently passed away. His neighbor has been coming to help him at his home. SLP has seen pt and pt will have MBSS tomorrow. SLP indicated pt may benefit from Palliative consult for long term goals of care. Psychiatry consulting for possible polypharmacy concerns and previous psychiatric history of pt.  Met with pt to assess. Per pt, he plans to return home at dc. He does not want to go to rehab. He states that he would be agreeable to [facility] RN and PT/OT if recommended. He states he has had [facility] in the past and he would like them again.   Will ask MD if pt would benefit from PT/OT evaluations with likely [facility] needs and potentially Palliative consult.   TOC will follow.  Expected Discharge Plan: [place] Barriers to Discharge: Continued Medical Work up   Patient Goals and CMS Choice Patient states their goals for this hospitalization and ongoing recovery are:: Return home CMS Medicare.gov Compare Post Acute Care list provided to:: Patient Choice offered to / list presented to : Patient  Expected Discharge Plan and Services Expected Discharge Plan: [place] In-house Referral: Clinical Social Work   Post Acute Care Choice: [place] arrangements for the past 2 months: [place]                                      Prior Living Arrangements/Services Living arrangements for the past 2 months: Single Family Home Lives with:: Self Patient language and need for interpreter reviewed:: Yes Do you feel safe going back to the place where you live?: Yes      Need for Family  Participation in Patient Care: Yes (Comment) Care giver support system in place?: Yes (comment) Current home services: DME Criminal Activity/Legal Involvement Pertinent to Current Situation/Hospitalization: No - Comment as needed  Activities of Daily Living   ADL Screening (condition at time of admission) Patient's cognitive ability adequate to safely complete daily activities?: No  Permission Sought/Granted                  Emotional Assessment Appearance:: Appears stated age Attitude/Demeanor/Rapport: Engaged Affect (typically observed): Flat, Pleasant Orientation: : Oriented to Self, Oriented to Place, Oriented to  Time, Oriented to Situation Alcohol / Substance Use: Not Applicable Psych Involvement: Yes (comment)  Admission diagnosis:  Urinary retention [R33.9] Elevated troponin [R79.89] Altered mental status, unspecified altered mental status type [R41.82] Patient Active Problem List   Diagnosis Date Noted  . Altered mental status 12/10/2018  . Chest pain 09/05/2018  . Iron deficiency anemia due to chronic blood loss   . Benign neoplasm of ascending colon   . Benign neoplasm of cecum   . Gastritis without bleeding   . 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 Chronic bilateral low back pain without sciatica 04/07/2016  . Syncope 12/31/2015  . Status post coronary artery bypass grafting 12/31/2015  . S/P CABG x 4 12/25/2015  .  Unstable angina ([place]) 12/01/2015  . Hypertensive heart disease   . Type II diabetes mellitus ([place])   . Abnormal chest CT 07/10/2015  . Pulmonary nodules 07/09/2015  . Basal cell carcinoma of skin 05/27/2015  . Bipolar disorder ([place]) 05/27/2015  . CAD (coronary artery disease), native coronary artery 05/27/2015  . DDD (degenerative disc disease), lumbosacral 05/27/2015  . Esophageal reflux 05/27/2015  . Benign essential hypertension 05/27/2015  . Sleep apnea 05/27/2015  . Urethral stricture 05/27/2015  . Benign prostatic hypertrophy  (BPH) with incomplete bladder emptying 11/29/2014  . CVA (cerebral infarction) 08/23/2014  . Hypertension   . S/P coronary artery stent placement 09/30/2011  . Hyperlipidemia 09/30/2011  . History of myocardial infarction 09/03/2011  . Seasonal allergies 09/03/2011   PCP:  Casilda Carls, MD Pharmacy:   [place] [place] [address] [place] [address] Phone: [phone] Fax: [phone]     Social Determinants of Health (SDOH) Interventions    Readmission Risk Interventions Readmission Risk Prevention Plan 12/11/2018  Transportation Screening Complete  HRI or Home Care Consult Complete  Social Work Consult for [place] Planning/Counseling Complete  Palliative Care Screening Not Applicable  Medication Review Press photographer) Complete  Some recent data might be hidden

## 2018-12-12 ENCOUNTER — Other Ambulatory Visit: Payer: Medicare Other

## 2018-12-12 ENCOUNTER — Inpatient Hospital Stay: Payer: Medicare Other

## 2018-12-12 DIAGNOSIS — R4182 Altered mental status, unspecified: Principal | ICD-10-CM

## 2018-12-12 LAB — CBC
HCT: 34.9 % — ABNORMAL LOW (ref 39.0–52.0)
Hemoglobin: 11.2 g/dL — ABNORMAL LOW (ref 13.0–17.0)
MCH: 28.6 pg (ref 26.0–34.0)
MCHC: 32.1 g/dL (ref 30.0–36.0)
MCV: 89 fL (ref 80.0–100.0)
Platelets: 196 10*3/uL (ref 150–400)
RBC: 3.92 MIL/uL — ABNORMAL LOW (ref 4.22–5.81)
RDW: 14 % (ref 11.5–15.5)
WBC: 6.3 10*3/uL (ref 4.0–10.5)
nRBC: 0 % (ref 0.0–0.2)

## 2018-12-12 LAB — GLUCOSE, CAPILLARY
Glucose-Capillary: 96 mg/dL (ref 70–99)
Glucose-Capillary: 116 mg/dL — ABNORMAL HIGH (ref 70–99)

## 2018-12-12 LAB — HIV ANTIBODY (ROUTINE TESTING W REFLEX): HIV Screen 4th Generation wRfx: NONREACTIVE

## 2018-12-12 LAB — HEMOGLOBIN A1C
Hgb A1c MFr Bld: 5.7 % — ABNORMAL HIGH (ref 4.8–5.6)
Mean Plasma Glucose: 117 mg/dL

## 2018-12-12 MED ORDER — RISPERIDONE 1 MG PO TABS: 1.0000 mg | ORAL_TABLET | Freq: Every day | ORAL | 0 refills | Status: AC

## 2018-12-12 MED ORDER — LORAZEPAM 1 MG PO TABS: 1.0000 mg | ORAL_TABLET | ORAL | 0 refills | Status: AC
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
## 2018-12-12 NOTE — Procedures (Signed)
Patient Name: Roger Pruitt  MRN: [id number]  Epilepsy Attending: Lora Havens  Referring Physician/Provider: Rufina Falco, NP Date: 12/12/2018 Duration: 30.15 mins  Patient history: 68 year old male who presented with altered mental status. EEG to evaluate for seizure.  Level of alertness: awake, drowsy  AEDs during EEG study: Topiramate  Technical aspects: This EEG study was done with scalp electrodes positioned according to the 10-20 International system of electrode placement. Electrical activity was acquired at a sampling rate of 500Hz  and reviewed with a high frequency filter of 70Hz  and a low frequency filter of 1Hz . EEG data were recorded continuously and digitally stored.   DESCRIPTION:  The posterior dominant rhythm consists of 8-9 Hz activity of moderate voltage (25-35 uV) seen predominantly in posterior head regions, symmetric and reactive to eye opening and eye closing. Physiologic photic driving was seen during photic stimulation. Hyperventilation was not performed.  IMPRESSION: This study is within normal limits. No seizures or epileptiform discharges were seen throughout the recording.  Leola Fiore Barbra Sarks
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
## 2018-12-12 NOTE — Progress Notes (Signed)
PT Cancellation Note  Patient Details Name: Ripley Stadnik MRN: [id number] DOB: 01/30/1951   Cancelled Treatment:    Reason Eval/Treat Not Completed: Patient at procedure or test/unavailable(Consult received and chart reviewed.  Patient currently off unit for EEG; will re-attempt at later time/date as medically appropriate and available.)   Jeovanny Cuadros H. Owens Shark, PT, DPT, NCS 12/12/18, 1:50 PM [phone]

## 2018-12-12 NOTE — TOC Transition Note (Signed)
Transition of Care [facility]) - CM/SW Discharge Note   Patient Details  Name: Shalin Drewek MRN: [id number] Date of Birth: 1950-09-24  [facility]) CM/SW Contact:  Elza Rafter, RN Phone Number: 12/12/2018, 1:28 PM   Clinical Narrative:   Discharging to home today.  Corene Cornea with [facility] is aware of discharge.  No further needs identified by TOC.  Geoffry Paradise, BSN, RN Care Manager [phone]       Barriers to Discharge: Continued Medical Work up   Patient Goals and CMS Choice Patient states their goals for this hospitalization and ongoing recovery are:: Return home CMS Medicare.gov Compare Post Acute Care list provided to:: Patient Choice offered to / list presented to : Patient  Discharge Placement                       Discharge Plan and Services In-house Referral: Clinical Social Work   Post Acute Care Choice: Home Health                    HH Arranged: RN, Nurse's Aide [facility] Agency: [place] ([facility]) Date HH Agency Contacted: 12/11/18 Time [place]: 1343 Representative spoke with at [place]: [place] ([place]) Interventions     Readmission Risk Interventions Readmission Risk Prevention Plan 12/11/2018  Transportation Screening Complete  HRI or [place] Complete  Social Work Consult for [place] Planning/Counseling [place] Screening Not Applicable  Medication Review [employer]) Complete  Some recent data might be hidden

## 2018-12-12 NOTE — Progress Notes (Signed)
eeg completed ° °
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
## 2018-12-13 ENCOUNTER — Other Ambulatory Visit: Payer: Self-pay

## 2018-12-13 ENCOUNTER — Encounter: Payer: Self-pay | Admitting: Cardiology

## 2018-12-13 ENCOUNTER — Ambulatory Visit (INDEPENDENT_AMBULATORY_CARE_PROVIDER_SITE_OTHER): Payer: Medicare Other | Admitting: Cardiology

## 2018-12-13 VITALS — BP 140/100 | HR 69 | Temp 97.2°F | Ht 75.0 in | Wt 221.8 lb

## 2018-12-13 DIAGNOSIS — I2510 Atherosclerotic heart disease of native coronary artery without angina pectoris: Secondary | ICD-10-CM | POA: Diagnosis not present

## 2018-12-13 DIAGNOSIS — R0609 Other forms of dyspnea: Secondary | ICD-10-CM | POA: Diagnosis not present

## 2018-12-13 DIAGNOSIS — R079 Chest pain, unspecified: Secondary | ICD-10-CM | POA: Diagnosis not present

## 2018-12-13 MED ORDER — HYDRALAZINE HCL 25 MG PO TABS: 25.0000 mg | ORAL_TABLET | Freq: Three times a day (TID) | ORAL | 3 refills | Status: DC

## 2018-12-13 NOTE — Addendum Note (Signed)
Addended by: Alvis Lemmings C on: 12/13/2018 01:38 PM   Modules accepted: Orders

## 2018-12-13 NOTE — Progress Notes (Signed)
Cardiology Office Note:    Date:  12/13/2018   ID:  Roger Pruitt, DOB June 12, 1950, MRN [id number]  PCP:  Casilda Carls, MD  Cardiologist:  Kate Sable, MD  Electrophysiologist:  None   Referring MD: Casilda Carls, MD   Chief Complaint  Patient presents with  . New Patient (Initial Visit)    History of Present Illness:    Roger Pruitt is a 68 y.o. male with a hx of hypertension, CAD/CABG x4 in 2017 who presents to establish care.  Patient was lastly seen at [facility] clinic, about 3 months ago.  He states having symptoms of chest discomfort and dyspnea on exertion which has worsened over the past 6 months.  He sometimes feels dizzy, with an unsteady gait.  He denies falls but stating tripping once in the past.  During his last cardiology visit, a treadmill stress test was ordered but patient was not able to perform test.  Last echo in the chart was performed in 2017 which showed normal ejection fraction of 55 to 60%  He denies nausea, vomiting, dysuria, hematuria.  Past Medical History:  Diagnosis Date  . Abdominal pain   . Acute cystitis without hematuria   . Anemia   . Anginal pain ([place])    rarely  . Arthritis    knees and hands  . Asthma   . Bipolar disorder ([place])    BIPOLAR/ SCHIZO/ AFFECTIVE   . BPD (bronchopulmonary dysplasia)   . BPH (benign prostatic hyperplasia)   . CAD (coronary artery disease)    a. s/p MI 2001, 2012;  b. s/p prior LCX stenting; b. 05/2012 Abnl MV; c. 05/2012 Cath: patent LCX stent w/ otw mod non-obs dzs->Med Rx.  . Carpal tunnel syndrome    surgery both hands  . Depression   . Disc degeneration, lumbar   . Dyspnea    WITH EXERTION  . Elevated PSA   . Esophageal reflux   . GERD (gastroesophageal reflux disease)   . Hematuria    history of gross and microscopic  . Hemorrhoid   . History of balanitis   . History of bladder infections   . History of echocardiogram    a. 07/2014 Echo: EF 60-65%, no rwma, nl LA size, nl RV/PASP.  Marland Kitchen  History of urethral stricture   . Hyperlipidemia   . Hypertension    controlled  . Hypertensive heart disease   . Incomplete bladder emptying   . Iron deficiency anemia, unspecified   . Lower urinary tract symptoms   . Myocardial infarction ([place])    X7 LAST 2001  . Neuropathy   . Nocturia   . Obsessive compulsive disorder   . Other dysphagia   . Panic attack   . Parkinson's disease ([place])   . Sleep apnea    SURGICAL TX  . Stroke ([place])   . Type II diabetes mellitus ([place])   . Unspecified vitamin D deficiency   . Urethral stricture   . Urinary retention   . Varicose vein of leg   . Vertigo    hx of  . Vitamin D deficiency   . Wears dentures    upper and lowers    Past Surgical History:  Procedure Laterality Date  . BACK SURGERY     lumbar disc  . BACK SURGERY  1984  . CARDIAC CATHETERIZATION  6/10;2011;Aug.2012;Oct. 2012;   . CARDIAC CATHETERIZATION  05/2012   [facility]; EF 60%, patent stent in the left circumflex, moderate three-vessel disease with no flow-limiting  lesions. Unchanged from most recent catheterization  . CARDIAC CATHETERIZATION N/A 12/10/2015   Procedure: Left Heart Cath and Coronary Angiography;  Surgeon: Corey Skains, MD;  Location: [place] [facility];  Service: Cardiovascular;  Laterality: N/A;  . CARPAL TUNNEL RELEASE  2009   left  . CATARACT EXTRACTION W/PHACO Left 02/14/2017   Procedure: CATARACT EXTRACTION PHACO AND INTRAOCULAR LENS PLACEMENT (IOC);  Surgeon: Birder Robson, MD;  Location: [facility] ORS;  Service: Ophthalmology;  Laterality: Left;  [place] 00:42 AP% 16.6 CDE 7.14 Fluid pack lot # [id number]  . CATARACT EXTRACTION W/PHACO Right 02/28/2017   Procedure: CATARACT EXTRACTION PHACO AND INTRAOCULAR LENS PLACEMENT (IOC);  Surgeon: Birder Robson, MD;  Location: [facility] ORS;  Service: Ophthalmology;  Laterality: Right;  [place] 01:09 AP% 11.4 CDE 7.98 Fluid pack lot # [id number]  . COLONOSCOPY WITH PROPOFOL N/A 06/29/2017   Procedure: COLONOSCOPY WITH  PROPOFOL;  Surgeon: [name], MD;  Location: [place];  Service: Endoscopy;  Laterality: N/A;  Diabetic - oral meds  . CORONARY ANGIOPLASTY  2011 & 2001   s/p stent  . CORONARY ARTERY BYPASS GRAFT     stent placement  . CORONARY ARTERY BYPASS GRAFT N/A 12/25/2015   Procedure: CORONARY ARTERY BYPASS GRAFTING (CABG) x4  , using left internal mammary artery and right leg greater saphenous vein harvested endoscopically LIMA-LAD SVG-OM2 SVG-DIAG SVG-PD;  Surgeon: Melrose Nakayama, MD;  Location: [place];  Service: Open Heart Surgery;  Laterality: N/A;  . [place]  . CYST REMOVAL LEG Right   . ESOPHAGOGASTRODUODENOSCOPY  06/29/2017   Procedure: ESOPHAGOGASTRODUODENOSCOPY (EGD);  Surgeon: [name], MD;  Location: [place];  Service: Endoscopy;;  . POLYPECTOMY  06/29/2017   Procedure: POLYPECTOMY INTESTINAL;  Surgeon: [name], MD;  Location: [facility];  Service: Endoscopy;;  . SKIN GRAFT    . TEE WITHOUT CARDIOVERSION N/A 12/25/2015   Procedure: TRANSESOPHAGEAL ECHOCARDIOGRAM (TEE);  Surgeon: Melrose Nakayama, MD;  Location: [place];  Service: Open Heart Surgery;  Laterality: N/A;  . TONSILLECTOMY    . UVULOPALATOPHARYNGOPLASTY      Current Medications: Current Meds  Medication Sig  . acetaminophen (TYLENOL) 500 MG tablet Take 1,000 mg by mouth every 6 (six) hours as needed for moderate pain or headache.   Marland Kitchen aspirin (ASPIRIN 81) 81 MG EC tablet Take 81 mg by mouth daily.   Marland Kitchen atorvastatin (LIPITOR) 40 MG tablet Take 1 tablet (40 mg total) by mouth daily at 6 PM.  . budesonide-formoterol (SYMBICORT) 160-4.5 MCG/ACT inhaler Inhale 2 puffs into the lungs 2 (two) times daily.  Marland Kitchen buPROPion (WELLBUTRIN XL) 300 MG 24 hr tablet Take 300 mg by mouth daily. noon  . carvedilol (COREG) 12.5 MG tablet Take 12.5 mg by mouth 2 (two) times daily.   Marland Kitchen doxepin (SINEQUAN) 25 MG capsule Take 50 mg by mouth at bedtime.   . gabapentin (NEURONTIN) 300 MG capsule  Take 300 mg by mouth 3 (three) times daily.   . hydrALAZINE (APRESOLINE) 25 MG tablet Take 1 tablet (25 mg total) by mouth every 8 (eight) hours.  . Ipratropium-Albuterol (COMBIVENT RESPIMAT) 20-100 MCG/ACT AERS respimat INHALE ONE PUFF INTO THE LUNGS EVERY 6 HOURS (Patient taking differently: Inhale 1 puff into the lungs every 6 (six) hours. )  . isosorbide mononitrate (IMDUR) 30 MG 24 hr tablet Take 30 mg by mouth daily.  Marland Kitchen JANUMET 50-1000 MG tablet Take 1 tablet by mouth 2 (two) times daily. Am and pm  . lisinopril (PRINIVIL,ZESTRIL)  20 MG tablet Take 20 mg by mouth 2 (two) times daily.   Marland Kitchen LORazepam (ATIVAN) 1 MG tablet Take 1-2 tablets (1-2 mg total) by mouth See admin instructions. Take 1 mg by mouth in the morning and take 2 mg by mouth at bedtime  . nitroGLYCERIN (NITROLINGUAL) 0.4 MG/SPRAY spray Place 1 spray under the tongue every 5 (five) minutes x 3 doses as needed for chest pain.  . pantoprazole (PROTONIX) 40 MG tablet Take 40 mg by mouth daily.   . risperidone (RISPERDAL) 1 MG tablet Take 1 tablet (1 mg total) by mouth at bedtime.  . tamsulosin (FLOMAX) 0.4 MG CAPS capsule Take 1 capsule (0.4 mg total) by mouth daily.  Marland Kitchen topiramate (TOPAMAX) 100 MG tablet Take 100 mg by mouth daily at 12 noon.   . traMADol (ULTRAM) 50 MG tablet Take 1 tablet (50 mg total) by mouth every 8 (eight) hours as needed. (Patient taking differently: Take 50 mg by mouth every 12 (twelve) hours as needed for moderate pain. )     Allergies:   Morphine and related, Synalgos-dc [aspirin-caff-dihydrocodeine], and Synchlor a [chlorpheniramine]   Social History   Socioeconomic History  . Marital status: Married    Spouse name: Not on file  . Number of children: Not on file  . Years of education: Not on file  . Highest education level: Not on file  Occupational History  . Not on file  Social Needs  . Financial resource strain: Not on file  . Food insecurity    Worry: Not on file    Inability: Not on file   . Transportation needs    Medical: Not on file    Non-medical: Not on file  Tobacco Use  . Smoking status: Former Smoker    Packs/day: 0.50    Years: 15.00    Pack years: 7.50    Types: Cigarettes    Quit date: 05/18/1999    Years since quitting: 19.5  . Smokeless tobacco: Never Used  . Tobacco comment: quit 17 + years  Substance and Sexual Activity  . Alcohol use: No    Alcohol/week: 0.0 standard drinks  . Drug use: No  . Sexual activity: Not on file  Lifestyle  . Physical activity    Days per week: Not on file    Minutes per session: Not on file  . Stress: Not on file  Relationships  . Social Herbalist on phone: Not on file    Gets together: Not on file    Attends religious service: Not on file    Active member of club or organization: Not on file    Attends meetings of clubs or organizations: Not on file    Relationship status: Not on file  Other Topics Concern  . Not on file  Social History Narrative   ** Merged History Encounter **         Family History: The patient's family history includes Heart disease in his brother, brother, mother, and another family member; Prostate cancer in his father. There is no history of Kidney disease, Kidney cancer, or Bladder Cancer.  ROS:   Please see the history of present illness.     All other systems reviewed and are negative.  EKGs/Labs/Other Studies Reviewed:    The following studies were reviewed today:  TEE 12/25/2015 Left ventricle: The cavity size was normal. The proximal septum   is hypertrophied, in a sigmoid appearance. There is no outflow   obstruction. Systolic  function was normal. The estimated ejection   fraction was in the range of 55% to 65%. Wall motion was normal;   there were no regional wall motion abnormalities. - Staged echo: Limited Post-CPB exam: Good, vigorous LV function.   The EF remains 55-65%. There are no apparent regional wall motion   abnormalities. No change post bypass in  aortic valve function. No   change post bypass in mitral valve function. Mild MR persists.    EKG:  EKG is  ordered today.  The ekg ordered today demonstrates normal sinus rhythm, first-degree AV block, left anterior fascicular block.  Recent Labs: 11/30/2018: TSH 1.191 12/10/2018: ALT 15; B Natriuretic Peptide 246.0 12/11/2018: BUN 18; Creatinine, Ser 0.88; Potassium 3.9; Sodium 143 12/12/2018: Hemoglobin 11.2; Platelets 196  Recent Lipid Panel    Component Value Date/Time   CHOL 91 09/05/2018 0438   CHOL 124 01/31/2014 0448   TRIG 58 09/05/2018 0438   TRIG 117 01/31/2014 0448   HDL 27 (L) 09/05/2018 0438   HDL 25 (L) 01/31/2014 0448   CHOLHDL 3.4 09/05/2018 0438   VLDL 12 09/05/2018 0438   VLDL 23 01/31/2014 0448   LDLCALC 52 09/05/2018 0438   LDLCALC 76 01/31/2014 0448    Physical Exam:    VS:  BP (!) 140/100 (BP Location: Right Arm, Patient Position: Sitting, Cuff Size: Normal)   Pulse 69   Temp (!) 97.2 F (36.2 C)   Ht 6\' 3"  (1.905 m)   Wt 221 lb 12 oz (100.6 kg)   SpO2 98%   BMI 27.72 kg/m     Wt Readings from Last 3 Encounters:  12/13/18 221 lb 12 oz (100.6 kg)  12/11/18 259 lb 8 oz (117.7 kg)  12/18/2018 204 lb (92.5 kg)     GEN:  Well nourished, well developed in no acute distress HEENT: Normal NECK: No JVD; No carotid bruits LYMPHATICS: No lymphadenopathy CARDIAC: RRR, 2/6 systolic murmur loudest at apex.  No rubs, gallops RESPIRATORY:  Clear to auscultation without rales, wheezing or rhonchi  ABDOMEN: Soft, non-tender, non-distended MUSCULOSKELETAL:  No edema; No deformity  SKIN: Warm and dry NEUROLOGIC:  Alert and oriented x 3 PSYCHIATRIC: Blank stare at times, soft-spoken.  ASSESSMENT:    1. Dyspnea on exertion   2. Chest pain, unspecified type   3. Coronary artery disease involving native coronary artery of native heart without angina pectoris    PLAN:    In order of problems listed above:  1. We will obtain echocardiogram 2. We will obtain  Lexiscan myocardial perfusion imaging. 3. Continue aspirin, Lipitor, as currently being prescribed.  Follow-up after echo and Lexiscan.   Medication Adjustments/Labs and Tests Ordered: Current medicines are reviewed at length with the patient today.  Concerns regarding medicines are outlined above.  Orders Placed This Encounter  Procedures  . NM Myocar Multi W/Spect W/Wall Motion / EF  . EKG 12-Lead  . ECHOCARDIOGRAM COMPLETE   No orders of the defined types were placed in this encounter.   Patient Instructions  Medication Instructions:  - Your physician recommends that you continue on your current medications as directed. Please refer to the Current Medication list given to you today.   If you need a refill on your cardiac medications before your next appointment, please call your pharmacy.   Lab work: - none ordered  If you have labs (blood work) drawn today and your tests are completely normal, you will receive your results only by: Marland Kitchen MyChart Message (if you have  MyChart) OR . A paper copy in the mail If you have any lab test that is abnormal or we need to change your treatment, we will call you to review the results.  Testing/Procedures: - Your physician has requested that you have an echocardiogram. Echocardiography is a painless test that uses sound waves to create images of your heart. It provides your doctor with information about the size and shape of your heart and how well your heart's chambers and valves are working. This procedure takes approximately one hour. There are no restrictions for this procedure.  - Your physician has requested that you have a lexiscan myoview.  [place]  Your caregiver has ordered a Stress Test with nuclear imaging. The purpose of this test is to evaluate the blood supply to your heart muscle. This procedure is referred to as a "Non-Invasive Stress Test." This is because other than having an IV started in your vein, nothing is inserted  or "invades" your body. Cardiac stress tests are done to find areas of poor blood flow to the heart by determining the extent of coronary artery disease (CAD). Some patients exercise on a treadmill, which naturally increases the blood flow to your heart, while others who are  unable to walk on a treadmill due to physical limitations have a pharmacologic/chemical stress agent called Lexiscan . This medicine will mimic walking on a treadmill by temporarily increasing your coronary blood flow.   Please note: these test may take anywhere between 2-4 hours to complete  PLEASE REPORT TO [place] AT THE FIRST DESK WILL DIRECT YOU WHERE TO GO  Date of Procedure:_____________________________________  Arrival Time for Procedure:______________________________  Instructions regarding medication:   _x___ : Hold diabetes medication morning of procedure- Janumet  _x___:  Hold betablocker(s) night before procedure and morning of procedure- Coreg (Carvedilol)   PLEASE NOTIFY THE OFFICE AT LEAST 24 HOURS IN ADVANCE IF YOU ARE UNABLE TO KEEP YOUR APPOINTMENT.  [phone] AND  PLEASE NOTIFY NUCLEAR MEDICINE AT [facility] AT LEAST 24 HOURS IN ADVANCE IF YOU ARE UNABLE TO KEEP YOUR APPOINTMENT. [phone]  How to prepare for your Myoview test:  1. Do not eat or drink after midnight 2. No caffeine for 24 hours prior to test 3. No smoking 24 hours prior to test. 4. Your medication may be taken with water.  If your doctor stopped a medication because of this test, do not take that medication. 5. Ladies, please do not wear dresses.  Skirts or pants are appropriate. Please wear a short sleeve shirt. 6. No perfume, cologne or lotion. 7. Wear comfortable walking shoes. No heels!   Follow-Up: At [facility], you and your health needs are our priority.  As part of our continuing mission to provide you with exceptional heart care, we have created designated Provider Care Teams.   These Care Teams include your primary Cardiologist (physician) and Advanced Practice Providers (APPs -  Physician Assistants and Nurse Practitioners) who all work together to provide you with the care you need, when you need it. . in 4-6 weeks   Any Other Special Instructions Will Be Listed Below (If Applicable). - N/A   Echocardiogram An echocardiogram is a procedure that uses painless sound waves (ultrasound) to produce an image of the heart. Images from an echocardiogram can provide important information about:  Signs of coronary artery disease (CAD).  Aneurysm detection. An aneurysm is a weak or damaged part of an artery wall that bulges out from  the normal force of blood pumping through the body.  Heart size and shape. Changes in the size or shape of the heart can be associated with certain conditions, including heart failure, aneurysm, and CAD.  Heart muscle function.  Heart valve function.  Signs of a past heart attack.  Fluid buildup around the heart.  Thickening of the heart muscle.  A tumor or infectious growth around the heart valves. Tell a health care provider about:  Any allergies you have.  All medicines you are taking, including vitamins, herbs, eye drops, creams, and over-the-counter medicines.  Any blood disorders you have.  Any surgeries you have had.  Any medical conditions you have.  Whether you are pregnant or may be pregnant. What are the risks? Generally, this is a safe procedure. However, problems may occur, including:  Allergic reaction to dye (contrast) that may be used during the procedure. What happens before the procedure? No specific preparation is needed. You may eat and drink normally. What happens during the procedure?   An IV tube may be inserted into one of your veins.  You may receive contrast through this tube. A contrast is an injection that improves the quality of the pictures from your heart.  A gel will be applied to your  chest.  A wand-like tool (transducer) will be moved over your chest. The gel will help to transmit the sound waves from the transducer.  The sound waves will harmlessly bounce off of your heart to allow the heart images to be captured in real-time motion. The images will be recorded on a computer. The procedure may vary among health care providers and hospitals. What happens after the procedure?  You may return to your normal, everyday life, including diet, activities, and medicines, unless your health care provider tells you not to do that. Summary  An echocardiogram is a procedure that uses painless sound waves (ultrasound) to produce an image of the heart.  Images from an echocardiogram can provide important information about the size and shape of your heart, heart muscle function, heart valve function, and fluid buildup around your heart.  You do not need to do anything to prepare before this procedure. You may eat and drink normally.  After the echocardiogram is completed, you may return to your normal, everyday life, unless your health care provider tells you not to do that. This information is not intended to replace advice given to you by your health care provider. Make sure you discuss any questions you have with your health care provider. Document Released: 03/11/2000 Document Revised: 07/05/2018 Document Reviewed: 04/16/2016 Elsevier Patient Education  2020 [place].   Cardiac Nuclear Scan A cardiac nuclear scan is a test that measures blood flow to the heart when a person is resting and when he or she is exercising. The test looks for problems such as:  Not enough blood reaching a portion of the heart.  The heart muscle not working normally. You may need this test if:  You have heart disease.  You have had abnormal lab results.  You have had heart surgery or a balloon procedure to open up blocked arteries (angioplasty).  You have chest pain.  You have shortness  of breath. In this test, a radioactive dye (tracer) is injected into your bloodstream. After the tracer has traveled to your heart, an imaging device is used to measure how much of the tracer is absorbed by or distributed to various areas of your heart. This procedure is usually done  at a hospital and takes 2-4 hours. Tell a health care provider about:  Any allergies you have.  All medicines you are taking, including vitamins, herbs, eye drops, creams, and over-the-counter medicines.  Any problems you or family members have had with anesthetic medicines.  Any blood disorders you have.  Any surgeries you have had.  Any medical conditions you have.  Whether you are pregnant or may be pregnant. What are the risks? Generally, this is a safe procedure. However, problems may occur, including:  Serious chest pain and heart attack. This is only a risk if the stress portion of the test is done.  Rapid heartbeat.  Sensation of warmth in your chest. This usually passes quickly.  Allergic reaction to the tracer. What happens before the procedure?  Ask your health care provider about changing or stopping your regular medicines. This is especially important if you are taking diabetes medicines or blood thinners.  Follow instructions from your health care provider about eating or drinking restrictions.  Remove your jewelry on the day of the procedure. What happens during the procedure?  An IV will be inserted into one of your veins.  Your health care provider will inject a small amount of radioactive tracer through the IV.  You will wait for 20-40 minutes while the tracer travels through your bloodstream.  Your heart activity will be monitored with an electrocardiogram (ECG).  You will lie down on an exam table.  Images of your heart will be taken for about 15-20 minutes.  You may also have a stress test. For this test, one of the following may be done: ? You will exercise on a  treadmill or stationary bike. While you exercise, your heart's activity will be monitored with an ECG, and your blood pressure will be checked. ? You will be given medicines that will increase blood flow to parts of your heart. This is done if you are unable to exercise.  When blood flow to your heart has peaked, a tracer will again be injected through the IV.  After 20-40 minutes, you will get back on the exam table and have more images taken of your heart.  Depending on the type of tracer used, scans may need to be repeated 3-4 hours later.  Your IV line will be removed when the procedure is over. The procedure may vary among health care providers and hospitals. What happens after the procedure?  Unless your health care provider tells you otherwise, you may return to your normal schedule, including diet, activities, and medicines.  Unless your health care provider tells you otherwise, you may increase your fluid intake. This will help to flush the contrast dye from your body. Drink enough fluid to keep your urine pale yellow.  Ask your health care provider, or the department that is doing the test: ? When will my results be ready? ? How will I get my results? Summary  A cardiac nuclear scan measures the blood flow to the heart when a person is resting and when he or she is exercising.  Tell your health care provider if you are pregnant.  Before the procedure, ask your health care provider about changing or stopping your regular medicines. This is especially important if you are taking diabetes medicines or blood thinners.  After the procedure, unless your health care provider tells you otherwise, increase your fluid intake. This will help flush the contrast dye from your body.  After the procedure, unless your health care provider tells you  otherwise, you may return to your normal schedule, including diet, activities, and medicines. This information is not intended to replace advice  given to you by your health care provider. Make sure you discuss any questions you have with your health care provider. Document Released: 04/08/2004 Document Revised: 08/28/2017 Document Reviewed: 08/28/2017 Elsevier Patient Education  [address], Kate Sable, MD  12/13/2018 12:13 PM    [place]

## 2018-12-13 NOTE — Addendum Note (Signed)
Addended by: Alvis Lemmings C on: 12/13/2018 12:23 PM   Modules accepted: Orders

## 2018-12-13 NOTE — Patient Instructions (Signed)
Medication Instructions:  - Your physician recommends that you continue on your current medications as directed. Please refer to the Current Medication list given to you today.   If you need a refill on your cardiac medications before your next appointment, please call your pharmacy.   Lab work: - none ordered  If you have labs (blood work) drawn today and your tests are completely normal, you will receive your results only by:  [place] (if you have MyChart) OR  A paper copy in the mail If you have any lab test that is abnormal or we need to change your treatment, we will call you to review the results.  Testing/Procedures: - Your physician has requested that you have an echocardiogram. Echocardiography is a painless test that uses sound waves to create images of your heart. It provides your doctor with information about the size and shape of your heart and how well your hearts chambers and valves are working. This procedure takes approximately one hour. There are no restrictions for this procedure.  - Your physician has requested that you have a lexiscan myoview.  [place]  Your caregiver has ordered a Stress Test with nuclear imaging. The purpose of this test is to evaluate the blood supply to your heart muscle. This procedure is referred to as a "Non-Invasive Stress Test." This is because other than having an IV started in your vein, nothing is inserted or "invades" your body. Cardiac stress tests are done to find areas of poor blood flow to the heart by determining the extent of coronary artery disease (CAD). Some patients exercise on a treadmill, which naturally increases the blood flow to your heart, while others who are  unable to walk on a treadmill due to physical limitations have a pharmacologic/chemical stress agent called Lexiscan . This medicine will mimic walking on a treadmill by temporarily increasing your coronary blood flow.   Please note: these test may take  anywhere between 2-4 hours to complete  PLEASE REPORT TO [place] AT THE FIRST DESK WILL DIRECT YOU WHERE TO GO  Date of Procedure:_____________________________________  Arrival Time for Procedure:______________________________  Instructions regarding medication:   _x___ : Hold diabetes medication morning of procedure- Janumet  _x___:  Hold betablocker(s) night before procedure and morning of procedure- Coreg (Carvedilol)   PLEASE NOTIFY THE OFFICE AT LEAST 24 HOURS IN ADVANCE IF YOU ARE UNABLE TO KEEP YOUR APPOINTMENT.  [phone] AND  PLEASE NOTIFY NUCLEAR MEDICINE AT [facility] AT LEAST 24 HOURS IN ADVANCE IF YOU ARE UNABLE TO KEEP YOUR APPOINTMENT. [phone]  How to prepare for your Myoview test:  1. Do not eat or drink after midnight 2. No caffeine for 24 hours prior to test 3. No smoking 24 hours prior to test. 4. Your medication may be taken with water.  If your doctor stopped a medication because of this test, do not take that medication. 5. Ladies, please do not wear dresses.  Skirts or pants are appropriate. Please wear a short sleeve shirt. 6. No perfume, cologne or lotion. 7. Wear comfortable walking shoes. No heels!   Follow-Up: At [facility], you and your health needs are our priority.  As part of our continuing mission to provide you with exceptional heart care, we have created designated Provider Care Teams.  These Care Teams include your primary Cardiologist (physician) and Advanced Practice Providers (APPs -  Physician Assistants and Nurse Practitioners) who all work together to provide you with the care  you need, when you need it.  in 4-6 weeks   Any Other Special Instructions Will Be Listed Below (If Applicable). - N/A   Echocardiogram An echocardiogram is a procedure that uses painless sound waves (ultrasound) to produce an image of the heart. Images from an echocardiogram can provide important information  about:  Signs of coronary artery disease (CAD).  Aneurysm detection. An aneurysm is a weak or damaged part of an artery wall that bulges out from the normal force of blood pumping through the body.  Heart size and shape. Changes in the size or shape of the heart can be associated with certain conditions, including heart failure, aneurysm, and CAD.  Heart muscle function.  Heart valve function.  Signs of a past heart attack.  Fluid buildup around the heart.  Thickening of the heart muscle.  A tumor or infectious growth around the heart valves. Tell a health care provider about:  Any allergies you have.  All medicines you are taking, including vitamins, herbs, eye drops, creams, and over-the-counter medicines.  Any blood disorders you have.  Any surgeries you have had.  Any medical conditions you have.  Whether you are pregnant or may be pregnant. What are the risks? Generally, this is a safe procedure. However, problems may occur, including:  Allergic reaction to dye (contrast) that may be used during the procedure. What happens before the procedure? No specific preparation is needed. You may eat and drink normally. What happens during the procedure?   An IV tube may be inserted into one of your veins.  You may receive contrast through this tube. A contrast is an injection that improves the quality of the pictures from your heart.  A gel will be applied to your chest.  A wand-like tool (transducer) will be moved over your chest. The gel will help to transmit the sound waves from the transducer.  The sound waves will harmlessly bounce off of your heart to allow the heart images to be captured in real-time motion. The images will be recorded on a computer. The procedure may vary among health care providers and hospitals. What happens after the procedure?  You may return to your normal, everyday life, including diet, activities, and medicines, unless your health care  provider tells you not to do that. Summary  An echocardiogram is a procedure that uses painless sound waves (ultrasound) to produce an image of the heart.  Images from an echocardiogram can provide important information about the size and shape of your heart, heart muscle function, heart valve function, and fluid buildup around your heart.  You do not need to do anything to prepare before this procedure. You may eat and drink normally.  After the echocardiogram is completed, you may return to your normal, everyday life, unless your health care provider tells you not to do that. This information is not intended to replace advice given to you by your health care provider. Make sure you discuss any questions you have with your health care provider. Document Released: 03/11/2000 Document Revised: 07/05/2018 Document Reviewed: 04/16/2016 Elsevier Patient Education  2020 [place].   Cardiac Nuclear Scan A cardiac nuclear scan is a test that measures blood flow to the heart when a person is resting and when he or she is exercising. The test looks for problems such as:  Not enough blood reaching a portion of the heart.  The heart muscle not working normally. You may need this test if:  You have heart disease.  You  have had abnormal lab results.  You have had heart surgery or a balloon procedure to open up blocked arteries (angioplasty).  You have chest pain.  You have shortness of breath. In this test, a radioactive dye (tracer) is injected into your bloodstream. After the tracer has traveled to your heart, an imaging device is used to measure how much of the tracer is absorbed by or distributed to various areas of your heart. This procedure is usually done at a hospital and takes 2-4 hours. Tell a health care provider about:  Any allergies you have.  All medicines you are taking, including vitamins, herbs, eye drops, creams, and over-the-counter medicines.  Any problems you or  family members have had with anesthetic medicines.  Any blood disorders you have.  Any surgeries you have had.  Any medical conditions you have.  Whether you are pregnant or may be pregnant. What are the risks? Generally, this is a safe procedure. However, problems may occur, including:  Serious chest pain and heart attack. This is only a risk if the stress portion of the test is done.  Rapid heartbeat.  Sensation of warmth in your chest. This usually passes quickly.  Allergic reaction to the tracer. What happens before the procedure?  Ask your health care provider about changing or stopping your regular medicines. This is especially important if you are taking diabetes medicines or blood thinners.  Follow instructions from your health care provider about eating or drinking restrictions.  Remove your jewelry on the day of the procedure. What happens during the procedure?  An IV will be inserted into one of your veins.  Your health care provider will inject a small amount of radioactive tracer through the IV.  You will wait for 20-40 minutes while the tracer travels through your bloodstream.  Your heart activity will be monitored with an electrocardiogram (ECG).  You will lie down on an exam table.  Images of your heart will be taken for about 15-20 minutes.  You may also have a stress test. For this test, one of the following may be done: ? You will exercise on a treadmill or stationary bike. While you exercise, your heart's activity will be monitored with an ECG, and your blood pressure will be checked. ? You will be given medicines that will increase blood flow to parts of your heart. This is done if you are unable to exercise.  When blood flow to your heart has peaked, a tracer will again be injected through the IV.  After 20-40 minutes, you will get back on the exam table and have more images taken of your heart.  Depending on the type of tracer used, scans may  need to be repeated 3-4 hours later.  Your IV line will be removed when the procedure is over. The procedure may vary among health care providers and hospitals. What happens after the procedure?  Unless your health care provider tells you otherwise, you may return to your normal schedule, including diet, activities, and medicines.  Unless your health care provider tells you otherwise, you may increase your fluid intake. This will help to flush the contrast dye from your body. Drink enough fluid to keep your urine pale yellow.  Ask your health care provider, or the department that is doing the test: ? When will my results be ready? ? How will I get my results? Summary  A cardiac nuclear scan measures the blood flow to the heart when a person is resting and when  he or she is exercising.  Tell your health care provider if you are pregnant.  Before the procedure, ask your health care provider about changing or stopping your regular medicines. This is especially important if you are taking diabetes medicines or blood thinners.  After the procedure, unless your health care provider tells you otherwise, increase your fluid intake. This will help flush the contrast dye from your body.  After the procedure, unless your health care provider tells you otherwise, you may return to your normal schedule, including diet, activities, and medicines. This information is not intended to replace advice given to you by your health care provider. Make sure you discuss any questions you have with your health care provider. Document Released: 04/08/2004 Document Revised: 08/28/2017 Document Reviewed: 08/28/2017 Elsevier Patient Education  2020 [employer].

## 2018-12-17 DIAGNOSIS — F250 Schizoaffective disorder, bipolar type: Secondary | ICD-10-CM | POA: Diagnosis not present

## 2018-12-18 DIAGNOSIS — I10 Essential (primary) hypertension: Secondary | ICD-10-CM | POA: Diagnosis not present

## 2018-12-18 DIAGNOSIS — T7840XD Allergy, unspecified, subsequent encounter: Secondary | ICD-10-CM | POA: Diagnosis not present

## 2018-12-20 ENCOUNTER — Other Ambulatory Visit: Payer: Self-pay | Admitting: Internal Medicine

## 2018-12-20 DIAGNOSIS — N390 Urinary tract infection, site not specified: Secondary | ICD-10-CM | POA: Diagnosis not present

## 2018-12-20 DIAGNOSIS — I639 Cerebral infarction, unspecified: Secondary | ICD-10-CM | POA: Diagnosis not present

## 2018-12-20 DIAGNOSIS — R9431 Abnormal electrocardiogram [ECG] [EKG]: Secondary | ICD-10-CM | POA: Diagnosis not present

## 2018-12-20 DIAGNOSIS — F419 Anxiety disorder, unspecified: Secondary | ICD-10-CM | POA: Diagnosis not present

## 2018-12-20 DIAGNOSIS — Z85828 Personal history of other malignant neoplasm of skin: Secondary | ICD-10-CM | POA: Diagnosis not present

## 2018-12-20 DIAGNOSIS — I252 Old myocardial infarction: Secondary | ICD-10-CM | POA: Diagnosis not present

## 2018-12-20 DIAGNOSIS — Z885 Allergy status to narcotic agent status: Secondary | ICD-10-CM | POA: Diagnosis not present

## 2018-12-20 DIAGNOSIS — F319 Bipolar disorder, unspecified: Secondary | ICD-10-CM | POA: Diagnosis not present

## 2018-12-20 DIAGNOSIS — N401 Enlarged prostate with lower urinary tract symptoms: Secondary | ICD-10-CM | POA: Diagnosis not present

## 2018-12-20 DIAGNOSIS — G20 Parkinson's disease: Secondary | ICD-10-CM | POA: Diagnosis not present

## 2018-12-20 DIAGNOSIS — Z20828 Contact with and (suspected) exposure to other viral communicable diseases: Secondary | ICD-10-CM | POA: Diagnosis not present

## 2018-12-20 DIAGNOSIS — G250 Essential tremor: Secondary | ICD-10-CM | POA: Diagnosis not present

## 2018-12-20 DIAGNOSIS — Z87891 Personal history of nicotine dependence: Secondary | ICD-10-CM | POA: Diagnosis not present

## 2018-12-20 DIAGNOSIS — N35919 Unspecified urethral stricture, male, unspecified site: Secondary | ICD-10-CM | POA: Diagnosis not present

## 2018-12-20 DIAGNOSIS — K219 Gastro-esophageal reflux disease without esophagitis: Secondary | ICD-10-CM | POA: Diagnosis not present

## 2018-12-20 DIAGNOSIS — I440 Atrioventricular block, first degree: Secondary | ICD-10-CM | POA: Diagnosis not present

## 2018-12-20 DIAGNOSIS — I4589 Other specified conduction disorders: Secondary | ICD-10-CM | POA: Diagnosis not present

## 2018-12-20 DIAGNOSIS — I2510 Atherosclerotic heart disease of native coronary artery without angina pectoris: Secondary | ICD-10-CM | POA: Diagnosis not present

## 2018-12-20 DIAGNOSIS — R1312 Dysphagia, oropharyngeal phase: Secondary | ICD-10-CM | POA: Diagnosis not present

## 2018-12-20 DIAGNOSIS — G9341 Metabolic encephalopathy: Secondary | ICD-10-CM | POA: Diagnosis not present

## 2018-12-20 DIAGNOSIS — E119 Type 2 diabetes mellitus without complications: Secondary | ICD-10-CM | POA: Diagnosis not present

## 2018-12-20 DIAGNOSIS — R338 Other retention of urine: Secondary | ICD-10-CM | POA: Diagnosis not present

## 2018-12-20 DIAGNOSIS — Z951 Presence of aortocoronary bypass graft: Secondary | ICD-10-CM | POA: Diagnosis not present

## 2018-12-20 DIAGNOSIS — E785 Hyperlipidemia, unspecified: Secondary | ICD-10-CM | POA: Diagnosis not present

## 2018-12-20 DIAGNOSIS — Z79899 Other long term (current) drug therapy: Secondary | ICD-10-CM | POA: Diagnosis not present

## 2018-12-20 DIAGNOSIS — Z8673 Personal history of transient ischemic attack (TIA), and cerebral infarction without residual deficits: Secondary | ICD-10-CM | POA: Diagnosis not present

## 2018-12-20 DIAGNOSIS — R4182 Altered mental status, unspecified: Secondary | ICD-10-CM | POA: Diagnosis not present

## 2018-12-20 DIAGNOSIS — R918 Other nonspecific abnormal finding of lung field: Secondary | ICD-10-CM | POA: Diagnosis not present

## 2018-12-20 DIAGNOSIS — R29818 Other symptoms and signs involving the nervous system: Secondary | ICD-10-CM | POA: Diagnosis not present

## 2018-12-20 DIAGNOSIS — R471 Dysarthria and anarthria: Secondary | ICD-10-CM | POA: Diagnosis not present

## 2018-12-20 DIAGNOSIS — F3131 Bipolar disorder, current episode depressed, mild: Secondary | ICD-10-CM | POA: Diagnosis not present

## 2018-12-20 DIAGNOSIS — I10 Essential (primary) hypertension: Secondary | ICD-10-CM | POA: Diagnosis not present

## 2018-12-20 DIAGNOSIS — J449 Chronic obstructive pulmonary disease, unspecified: Secondary | ICD-10-CM | POA: Diagnosis not present

## 2018-12-20 DIAGNOSIS — R339 Retention of urine, unspecified: Secondary | ICD-10-CM | POA: Diagnosis not present

## 2018-12-20 DIAGNOSIS — G8929 Other chronic pain: Secondary | ICD-10-CM | POA: Diagnosis not present

## 2018-12-21 DIAGNOSIS — R4182 Altered mental status, unspecified: Secondary | ICD-10-CM | POA: Diagnosis not present

## 2018-12-21 DIAGNOSIS — Z79899 Other long term (current) drug therapy: Secondary | ICD-10-CM | POA: Insufficient documentation

## 2018-12-21 DIAGNOSIS — R339 Retention of urine, unspecified: Secondary | ICD-10-CM | POA: Insufficient documentation

## 2018-12-21 DIAGNOSIS — F3131 Bipolar disorder, current episode depressed, mild: Secondary | ICD-10-CM | POA: Diagnosis not present

## 2018-12-21 DIAGNOSIS — I10 Essential (primary) hypertension: Secondary | ICD-10-CM | POA: Diagnosis not present

## 2018-12-21 DIAGNOSIS — R1312 Dysphagia, oropharyngeal phase: Secondary | ICD-10-CM | POA: Diagnosis not present

## 2018-12-24 ENCOUNTER — Ambulatory Visit (INDEPENDENT_AMBULATORY_CARE_PROVIDER_SITE_OTHER): Payer: Medicare Other | Admitting: Urology

## 2018-12-24 ENCOUNTER — Other Ambulatory Visit: Payer: Self-pay | Admitting: Family Medicine

## 2018-12-24 ENCOUNTER — Other Ambulatory Visit: Payer: Self-pay

## 2018-12-24 ENCOUNTER — Other Ambulatory Visit
Admission: RE | Admit: 2018-12-24 | Discharge: 2018-12-24 | Disposition: A | Discharge status: Home / Self Care | Payer: Medicare Other | Attending: Urology | Admitting: Urology

## 2018-12-24 VITALS — BP 150/80 | HR 61 | Wt 221.0 lb

## 2018-12-24 DIAGNOSIS — N138 Other obstructive and reflux uropathy: Secondary | ICD-10-CM | POA: Insufficient documentation

## 2018-12-24 DIAGNOSIS — I2510 Atherosclerotic heart disease of native coronary artery without angina pectoris: Secondary | ICD-10-CM

## 2018-12-24 DIAGNOSIS — R339 Retention of urine, unspecified: Secondary | ICD-10-CM

## 2018-12-24 DIAGNOSIS — N401 Enlarged prostate with lower urinary tract symptoms: Secondary | ICD-10-CM | POA: Diagnosis not present

## 2018-12-24 DIAGNOSIS — Z87440 Personal history of urinary (tract) infections: Secondary | ICD-10-CM | POA: Diagnosis not present

## 2018-12-24 DIAGNOSIS — N402 Nodular prostate without lower urinary tract symptoms: Secondary | ICD-10-CM | POA: Diagnosis not present

## 2018-12-24 DIAGNOSIS — Z87448 Personal history of other diseases of urinary system: Secondary | ICD-10-CM

## 2018-12-24 LAB — BLADDER SCAN AMB NON-IMAGING: Scan Result: 322

## 2018-12-24 LAB — PSA: Prostatic Specific Antigen: 1.37 ng/mL (ref 0.00–4.00)

## 2018-12-24 MED ORDER — DIAZEPAM 10 MG PO TABS: ORAL_TABLET | ORAL | 0 refills | Status: DC

## 2018-12-24 NOTE — Progress Notes (Signed)
  12/24/2018 1:52 PM   Roger Pruitt 04/10/1950 [id number]  Referring provider: Jadali, Fayegh, MD [address] [place],  [place] [address]  Chief Complaint  Patient presents with  . Benign Prostatic Hypertrophy    HPI: [name] is a 68 year old male with a history of hematuria, BPH with LU TS and rUTI's who presents today for follow up.  History of hematuria Former smoker.  Hematuria work up x 3, 2011, 2015 & 2017 - findings positive for bilateral nephrolithiasis, bilateral renal cysts and enlarged friable prostate - s/p KTP in 2016.  No report of gross hematuria.    BPH WITH LUTS  (prostate and/or bladder) IPSS score: 20/5    PVR: 322 mL      Previous score: 18/4   Previous PVR: 382 mL   Major complaint(s):  Frequency and weak urinary stream.  Patient denies any gross hematuria, dysuria or suprapubic/flank pain.  Patient denies any fevers, chills, nausea or vomiting.   Currently taking: Flomax.  His has had KTP in 2016.    He has not been cathing as he states he has never received catheters from the catheter company.    He does not have a family history of PCa.  IPSS    Row Name 12/24/18 1300         International Prostate Symptom Score   How often have you had the sensation of not emptying your bladder?  Almost always     How often have you had to urinate less than every two hours?  Almost always     How often have you found you stopped and started again several times when you urinated?  Not at All     How often have you found it difficult to postpone urination?  About half the time     How often have you had a weak urinary stream?  Almost always     How often have you had to strain to start urination?  Not at All     How many times did you typically get up at night to urinate?  2 Times     Total IPSS Score  20       Quality of Life due to urinary symptoms   If you were to spend the rest of your life with your urinary condition just the way it is now how would  you feel about that?  Unhappy        Score:  1-7 Mild 8-19 Moderate 20-35 Severe   rUTI's 01/2018 - multiple species 07/07/2018 - staphylococcus epidermidis - resistant to ciprofloxacin, oxacillin and trimethoprim/sulfamethoxazole 09/02/2018 - proteus mirabilis - resistant to nitrofurantoin 09/05/2018 - proteus mirabilis - resistant to nitrofurantoin - was to be admitted but left AMA - had no one to take care of dog 09/21/2018 pseudomonas aeruginosa - left AMA before scrotal ultrasound could be performed - said he was feeling better Today, he is symptomatic.    Of note, his wife passed away two weeks.    PMH: Past Medical History:  Diagnosis Date  . Abdominal pain   . Acute cystitis without hematuria   . Anemia   . Anginal pain (HCC)    rarely  . Arthritis    knees and hands  . Asthma   . Bipolar disorder (HCC)    BIPOLAR/ SCHIZO/ AFFECTIVE   . BPD (bronchopulmonary dysplasia)   . BPH (benign prostatic hyperplasia)   . CAD (coronary artery disease)    a. s/p   MI 2001, 2012;  b. s/p prior LCX stenting; b. 05/2012 Abnl MV; c. 05/2012 Cath: patent LCX stent w/ otw mod non-obs dzs->Med Rx.  . Carpal tunnel syndrome    surgery both hands  . Depression   . Disc degeneration, lumbar   . Dyspnea    WITH EXERTION  . Elevated PSA   . Esophageal reflux   . GERD (gastroesophageal reflux disease)   . Hematuria    history of gross and microscopic  . Hemorrhoid   . History of balanitis   . History of bladder infections   . History of echocardiogram    a. 07/2014 Echo: EF 60-65%, no rwma, nl LA size, nl RV/PASP.  . History of urethral stricture   . Hyperlipidemia   . Hypertension    controlled  . Hypertensive heart disease   . Incomplete bladder emptying   . Iron deficiency anemia, unspecified   . Lower urinary tract symptoms   . Myocardial infarction (HCC)    X7 LAST 2001  . Neuropathy   . Nocturia   . Obsessive compulsive disorder   . Other dysphagia   . Panic attack    . Parkinson's disease (HCC)   . Sleep apnea    SURGICAL TX  . Stroke (HCC)   . Type II diabetes mellitus (HCC)   . Unspecified vitamin D deficiency   . Urethral stricture   . Urinary retention   . Varicose vein of leg   . Vertigo    hx of  . Vitamin D deficiency   . Wears dentures    upper and lowers    Surgical History: Past Surgical History:  Procedure Laterality Date  . BACK SURGERY     lumbar disc  . BACK SURGERY  1984  . CARDIAC CATHETERIZATION  6/10;2011;Aug.2012;Oct. 2012;   . CARDIAC CATHETERIZATION  05/2012   [facility]; EF 60%, patent stent in the left circumflex, moderate three-vessel disease with no flow-limiting lesions. Unchanged from most recent catheterization  . CARDIAC CATHETERIZATION N/A 12/10/2015   Procedure: Left Heart Cath and Coronary Angiography;  Surgeon: Bruce J Kowalski, MD;  Location: [facility];  Service: Cardiovascular;  Laterality: N/A;  . CARPAL TUNNEL RELEASE  2009   left  . CATARACT EXTRACTION W/PHACO Left 02/14/2017   Procedure: CATARACT EXTRACTION PHACO AND INTRAOCULAR LENS PLACEMENT (IOC);  Surgeon: Porfilio, William, MD;  Location: [facility] ORS;  Service: Ophthalmology;  Laterality: Left;  US 00:42 AP% 16.6 CDE 7.14 Fluid pack lot # [id number]  . CATARACT EXTRACTION W/PHACO Right 02/28/2017   Procedure: CATARACT EXTRACTION PHACO AND INTRAOCULAR LENS PLACEMENT (IOC);  Surgeon: Porfilio, William, MD;  Location: [facility];  Service: Ophthalmology;  Laterality: Right;  US 01:09 AP% 11.4 CDE 7.98 Fluid pack lot # [id number]  . COLONOSCOPY WITH PROPOFOL N/A 06/29/2017   Procedure: COLONOSCOPY WITH PROPOFOL;  Surgeon: Wohl, Darren, MD;  Location: [facility];  Service: Endoscopy;  Laterality: N/A;  Diabetic - oral meds  . CORONARY ANGIOPLASTY  2011 & 2001   s/p stent  . CORONARY ARTERY BYPASS GRAFT     stent placement  . CORONARY ARTERY BYPASS GRAFT N/A 12/25/2015   Procedure: CORONARY ARTERY BYPASS GRAFTING (CABG) x4  , using left internal  mammary artery and right leg greater saphenous vein harvested endoscopically LIMA-LAD SVG-OM2 SVG-DIAG SVG-PD;  Surgeon: Steven C Hendrickson, MD;  Location: [facility];  Service: Open Heart Surgery;  Laterality: N/A;  . COSMETIC SURGERY  1967  . CYST REMOVAL LEG Right   .   ESOPHAGOGASTRODUODENOSCOPY  06/29/2017   Procedure: ESOPHAGOGASTRODUODENOSCOPY (EGD);  Surgeon: Wohl, Darren, MD;  Location: [facility];  Service: Endoscopy;;  . POLYPECTOMY  06/29/2017   Procedure: POLYPECTOMY INTESTINAL;  Surgeon: Wohl, Darren, MD;  Location: [facility];  Service: Endoscopy;;  . SKIN GRAFT    . TEE WITHOUT CARDIOVERSION N/A 12/25/2015   Procedure: TRANSESOPHAGEAL ECHOCARDIOGRAM (TEE);  Surgeon: Steven C Hendrickson, MD;  Location: [facility];  Service: Open Heart Surgery;  Laterality: N/A;  . TONSILLECTOMY    . UVULOPALATOPHARYNGOPLASTY      Home Medications:  Allergies as of 12/24/2018      Reactions   Morphine And Related Nausea And Vomiting   Synalgos-dc [aspirin-caff-dihydrocodeine] Itching, Other (See Comments)   Reaction:  Stinging    Synchlor A [chlorpheniramine] Rash, Other (See Comments)   Patient doesn't recall this allergy.      Medication List       Accurate as of December 24, 2018  1:52 PM. If you have any questions, ask your nurse or doctor.        acetaminophen 500 MG tablet Commonly known as: TYLENOL Take 1,000 mg by mouth every 6 (six) hours as needed for moderate pain or headache.   Aspirin 81 81 MG EC tablet Generic drug: aspirin Take 81 mg by mouth daily.   atorvastatin 40 MG tablet Commonly known as: LIPITOR Take 1 tablet (40 mg total) by mouth daily at 6 PM.   buPROPion 300 MG 24 hr tablet Commonly known as: WELLBUTRIN XL Take 300 mg by mouth daily. noon   carvedilol 12.5 MG tablet Commonly known as: COREG Take 12.5 mg by mouth 2 (two) times daily.   Combivent Respimat 20-100 MCG/ACT Aers respimat Generic drug: Ipratropium-Albuterol INHALE ONE PUFF  INTO THE LUNGS EVERY 6 HOURS What changed:   how much to take  how to take this  when to take this  additional instructions   diazepam 10 MG tablet Commonly known as: Valium Take 30 minutes prior to MRI Started by:  , PA-C   doxepin 25 MG capsule Commonly known as: SINEQUAN Take 50 mg by mouth at bedtime.   fluticasone 50 MCG/ACT nasal spray Commonly known as: FLONASE Place into the nose.   gabapentin 300 MG capsule Commonly known as: NEURONTIN Take 300 mg by mouth 3 (three) times daily.   hydrALAZINE 25 MG tablet Commonly known as: APRESOLINE Take 1 tablet (25 mg total) by mouth every 8 (eight) hours.   isosorbide mononitrate 30 MG 24 hr tablet Commonly known as: IMDUR Take 30 mg by mouth daily.   Janumet 50-1000 MG tablet Generic drug: sitaGLIPtin-metformin Take 1 tablet by mouth 2 (two) times daily. Am and pm   lisinopril 20 MG tablet Commonly known as: ZESTRIL Take 20 mg by mouth 2 (two) times daily.   LORazepam 1 MG tablet Commonly known as: ATIVAN Take 1-2 tablets (1-2 mg total) by mouth See admin instructions. Take 1 mg by mouth in the morning and take 2 mg by mouth at bedtime   nitroGLYCERIN 0.4 MG/SPRAY spray Commonly known as: NITROLINGUAL Place 1 spray under the tongue every 5 (five) minutes x 3 doses as needed for chest pain.   pantoprazole 40 MG tablet Commonly known as: PROTONIX Take 40 mg by mouth daily.   risperiDONE 1 MG tablet Commonly known as: RISPERDAL Take 1 tablet (1 mg total) by mouth at bedtime.   Symbicort 160-4.5 MCG/ACT inhaler Generic drug: budesonide-formoterol INHALE TWO PUFFS INTO THE LUNGS TWICE DAILY     tamsulosin 0.4 MG Caps capsule Commonly known as: FLOMAX Take 1 capsule (0.4 mg total) by mouth daily.   topiramate 100 MG tablet Commonly known as: TOPAMAX Take 100 mg by mouth daily at 12 noon.   traMADol 50 MG tablet Commonly known as: ULTRAM Take 1 tablet (50 mg total) by mouth every 8 (eight)  hours as needed. What changed:   when to take this  reasons to take this       Allergies:  Allergies  Allergen Reactions  . Morphine And Related Nausea And Vomiting  . Synalgos-Dc [Aspirin-Caff-Dihydrocodeine] Itching and Other (See Comments)    Reaction:  Stinging   . Synchlor A [Chlorpheniramine] Rash and Other (See Comments)    Patient doesn't recall this allergy.    Family History: Family History  Problem Relation Age of Onset  . Heart disease Mother   . Heart disease Brother   . Prostate cancer Father   . Heart disease Other   . Heart disease Brother   . Kidney disease Neg Hx   . Kidney cancer Neg Hx   . Bladder Cancer Neg Hx     Social History:  reports that he quit smoking about 19 years ago. His smoking use included cigarettes. He has a 7.50 pack-year smoking history. He has never used smokeless tobacco. He reports that he does not drink alcohol or use drugs.  ROS: UROLOGY Frequent Urination?: Yes Hard to postpone urination?: No Burning/pain with urination?: No Get up at night to urinate?: No Leakage of urine?: No Urine stream starts and stops?: No Trouble starting stream?: No Do you have to strain to urinate?: No Blood in urine?: No Urinary tract infection?: Yes Sexually transmitted disease?: No Injury to kidneys or bladder?: No Painful intercourse?: No Weak stream?: Yes Erection problems?: Yes Penile pain?: No  Gastrointestinal Nausea?: No Vomiting?: No Indigestion/heartburn?: Yes Diarrhea?: No Constipation?: No  Constitutional Fever: No Night sweats?: No Weight loss?: No Fatigue?: Yes  Skin Skin rash/lesions?: No Itching?: No  Eyes Blurred vision?: No Double vision?: No  Ears/Nose/Throat Sore throat?: No Sinus problems?: Yes  Hematologic/Lymphatic Swollen glands?: No Easy bruising?: No  Cardiovascular Leg swelling?: No Chest pain?: Yes  Respiratory Cough?: No Shortness of breath?: Yes  Endocrine Excessive thirst?:  No  Musculoskeletal Back pain?: Yes Joint pain?: Yes  Neurological Headaches?: No Dizziness?: Yes  Psychologic Depression?: Yes Anxiety?: No  Physical Exam: BP (!) 150/80   Pulse 61   Wt 221 lb (100.2 kg)   BMI 27.62 kg/m   Constitutional:  Well nourished. Alert and oriented, No acute distress. HEENT: [place] AT, moist mucus membranes.  Trachea midline, no masses. Cardiovascular: No clubbing, cyanosis, or edema. Respiratory: Normal respiratory effort, no increased work of breathing. GI: Abdomen is soft, non tender, non distended, no abdominal masses. Liver and spleen not palpable.  No hernias appreciated.  Stool sample for occult testing is not indicated.   GU: No CVA tenderness.  No bladder fullness or masses.  Patient with uncircumcised phallus.   Foreskin easily retracted.  Urethral meatus is patent.  No penile discharge. No penile lesions or rashes. Scrotum without lesions, cysts, rashes and/or edema.  Testicles are located scrotally bilaterally. No masses are appreciated in the testicles. Left and right epididymis are normal. Rectal: Patient with  normal sphincter tone. Anus and perineum without scarring or rashes. No rectal masses are appreciated. Prostate is approximately 50 grams, 1 cm somewhat firm nodule is appreciated in the right lobe.  Seminal vesicles could not be   palpated. Skin: No rashes, bruises or suspicious lesions. Lymph: No inguinal adenopathy. Neurologic: Grossly intact, no focal deficits, moving all 4 extremities. Psychiatric: Normal mood and affect.  Laboratory Data: Lab Results  Component Value Date   WBC 6.3 12/12/2018   HGB 11.2 (L) 12/12/2018   HCT 34.9 (L) 12/12/2018   MCV 89.0 12/12/2018   PLT 196 12/12/2018    Lab Results  Component Value Date   CREATININE 0.88 12/11/2018    No results found for: PSA  No results found for: TESTOSTERONE  Lab Results  Component Value Date   HGBA1C 5.7 (H) 12/11/2018    Lab Results  Component Value Date    TSH 1.191 11/30/2018       Component Value Date/Time   CHOL 91 09/05/2018 0438   CHOL 124 01/31/2014 0448   HDL 27 (L) 09/05/2018 0438   HDL 25 (L) 01/31/2014 0448   CHOLHDL 3.4 09/05/2018 0438   VLDL 12 09/05/2018 0438   VLDL 23 01/31/2014 0448   LDLCALC 52 09/05/2018 0438   LDLCALC 76 01/31/2014 0448    Lab Results  Component Value Date   AST 14 (L) 12/10/2018   Lab Results  Component Value Date   ALT 15 12/10/2018   No components found for: ALKALINEPHOPHATASE No components found for: BILIRUBINTOTAL  No results found for: ESTRADIOL  I have reviewed the labs.   Pertinent Imaging: Results for KELDRIC, POYER (MRN [id number]) as of 01/02/2019 16:13  Ref. Range 12/24/2018 13:34  Scan Result Unknown [id number]     Assessment & Plan:    1. Left epididymitis and orchitis Resolved.  2. Urinary retention Patient is advised to start with CIC again Reiterated to him that harboring large residuals puts him at risk for UTI's and infections in the testicle/prostate He will self cath to keep residuals below 300 cc Will contact catheter company to get catheters set to his home - he is given samples of strait caths today   3. History of hematuria Hematuria work up completed in 2011, 2015 and 2017 - findings positive for bilateral nephrolithiasis, bilateral renal cysts and enlarged friable prostate No report of gross hematuria  RTC in one year for UA - patient to report any gross hematuria in the interim    4. BPH with LUTS IPSS score is 20/5, it is worsening Continue conservative management, avoiding bladder irritants and timed voiding's Most bothersome symptoms is/are frequency and a weak urinary stream Continue tamsulosin 0.4 mg daily; refills given  5. Prostate nodule Persistent nodule and change in consistency - will obtain a prostate MRI for further clarification   6. rUTI's Criteria for recurrent UTI has been met with 2 or more infections in 6 months or 3 or greater  infections in one year  Explained to the patient that a likely source of infection for him is his large residuals and that he will need to be diligent with CIC to keep his residuals low to help stave off infections                                      Return for prostate mri report .  These notes generated with voice recognition software. I apologize for typographical errors.  Zara Council, PA-C  [facility] [address]  [place], [place] [address] [phone]

## 2018-12-25 DIAGNOSIS — N400 Enlarged prostate without lower urinary tract symptoms: Secondary | ICD-10-CM | POA: Diagnosis not present

## 2018-12-25 DIAGNOSIS — R1310 Dysphagia, unspecified: Secondary | ICD-10-CM | POA: Diagnosis not present

## 2018-12-25 DIAGNOSIS — I10 Essential (primary) hypertension: Secondary | ICD-10-CM | POA: Diagnosis not present

## 2018-12-27 DIAGNOSIS — 419620001 Death: Secondary | SNOMED CT | POA: Diagnosis not present

## 2018-12-27 DEATH — deceased

## 2018-12-28 ENCOUNTER — Other Ambulatory Visit: Payer: Self-pay

## 2018-12-28 ENCOUNTER — Encounter
Admission: RE | Admit: 2018-12-28 | Discharge: 2018-12-28 | Disposition: A | Discharge status: Home / Self Care | Payer: Medicare Other | Source: Ambulatory Visit | Attending: Cardiology | Admitting: Cardiology

## 2018-12-28 DIAGNOSIS — R0609 Other forms of dyspnea: Secondary | ICD-10-CM

## 2018-12-28 DIAGNOSIS — R079 Chest pain, unspecified: Secondary | ICD-10-CM | POA: Diagnosis not present

## 2018-12-28 DIAGNOSIS — R0600 Dyspnea, unspecified: Secondary | ICD-10-CM | POA: Insufficient documentation

## 2018-12-28 LAB — NM MYOCAR MULTI W/SPECT W/WALL MOTION / EF
LV dias vol: 139 mL (ref 62–150)
LV sys vol: 50 mL
Peak HR: 74 {beats}/min
Percent HR: 48 %
Rest HR: 59 {beats}/min
SDS: 7
SRS: 4
SSS: 11
TID: 0.96

## 2018-12-28 MED ORDER — TECHNETIUM TC 99M TETROFOSMIN IV KIT
33.3100 | PACK | Freq: Once | INTRAVENOUS | Status: AC | PRN
  Administered 2018-12-28: 33.31 via INTRAVENOUS

## 2018-12-28 MED ORDER — TECHNETIUM TC 99M TETROFOSMIN IV KIT
10.8500 | PACK | Freq: Once | INTRAVENOUS | Status: AC | PRN
  Administered 2018-12-28: 10.85 via INTRAVENOUS

## 2018-12-28 MED ORDER — REGADENOSON 0.4 MG/5ML IV SOLN
0.4000 mg | Freq: Once | INTRAVENOUS | Status: AC
  Administered 2018-12-28: 10:00:00 0.4 mg via INTRAVENOUS

## 2019-01-02 ENCOUNTER — Other Ambulatory Visit: Payer: Self-pay

## 2019-01-02 ENCOUNTER — Emergency Department: Payer: Medicare Other

## 2019-01-02 ENCOUNTER — Encounter: Payer: Self-pay | Admitting: Urology

## 2019-01-02 ENCOUNTER — Inpatient Hospital Stay
Admission: EM | Admit: 2019-01-02 | Discharge: 2019-01-04 | DRG: 690 | Disposition: A | Discharge status: Home / Self Care | Payer: Medicare Other | Attending: Internal Medicine | Admitting: Internal Medicine

## 2019-01-02 DIAGNOSIS — Z23 Encounter for immunization: Secondary | ICD-10-CM

## 2019-01-02 DIAGNOSIS — Z87891 Personal history of nicotine dependence: Secondary | ICD-10-CM | POA: Diagnosis not present

## 2019-01-02 DIAGNOSIS — F429 Obsessive-compulsive disorder, unspecified: Secondary | ICD-10-CM | POA: Diagnosis present

## 2019-01-02 DIAGNOSIS — M1990 Unspecified osteoarthritis, unspecified site: Secondary | ICD-10-CM | POA: Diagnosis present

## 2019-01-02 DIAGNOSIS — Z20828 Contact with and (suspected) exposure to other viral communicable diseases: Secondary | ICD-10-CM | POA: Diagnosis present

## 2019-01-02 DIAGNOSIS — E785 Hyperlipidemia, unspecified: Secondary | ICD-10-CM | POA: Diagnosis present

## 2019-01-02 DIAGNOSIS — Z7982 Long term (current) use of aspirin: Secondary | ICD-10-CM

## 2019-01-02 DIAGNOSIS — I2510 Atherosclerotic heart disease of native coronary artery without angina pectoris: Secondary | ICD-10-CM | POA: Diagnosis present

## 2019-01-02 DIAGNOSIS — R338 Other retention of urine: Secondary | ICD-10-CM | POA: Diagnosis present

## 2019-01-02 DIAGNOSIS — Z888 Allergy status to other drugs, medicaments and biological substances status: Secondary | ICD-10-CM

## 2019-01-02 DIAGNOSIS — Z03818 Encounter for observation for suspected exposure to other biological agents ruled out: Secondary | ICD-10-CM | POA: Diagnosis not present

## 2019-01-02 DIAGNOSIS — Z79899 Other long term (current) drug therapy: Secondary | ICD-10-CM

## 2019-01-02 DIAGNOSIS — E559 Vitamin D deficiency, unspecified: Secondary | ICD-10-CM | POA: Diagnosis present

## 2019-01-02 DIAGNOSIS — N3000 Acute cystitis without hematuria: Secondary | ICD-10-CM | POA: Diagnosis present

## 2019-01-02 DIAGNOSIS — Z955 Presence of coronary angioplasty implant and graft: Secondary | ICD-10-CM | POA: Diagnosis not present

## 2019-01-02 DIAGNOSIS — Z8042 Family history of malignant neoplasm of prostate: Secondary | ICD-10-CM | POA: Diagnosis not present

## 2019-01-02 DIAGNOSIS — K219 Gastro-esophageal reflux disease without esophagitis: Secondary | ICD-10-CM | POA: Diagnosis present

## 2019-01-02 DIAGNOSIS — B954 Other streptococcus as the cause of diseases classified elsewhere: Secondary | ICD-10-CM | POA: Diagnosis present

## 2019-01-02 DIAGNOSIS — N401 Enlarged prostate with lower urinary tract symptoms: Secondary | ICD-10-CM | POA: Diagnosis present

## 2019-01-02 DIAGNOSIS — G4730 Sleep apnea, unspecified: Secondary | ICD-10-CM | POA: Diagnosis present

## 2019-01-02 DIAGNOSIS — E119 Type 2 diabetes mellitus without complications: Secondary | ICD-10-CM | POA: Diagnosis present

## 2019-01-02 DIAGNOSIS — Z8249 Family history of ischemic heart disease and other diseases of the circulatory system: Secondary | ICD-10-CM | POA: Diagnosis not present

## 2019-01-02 DIAGNOSIS — Z885 Allergy status to narcotic agent status: Secondary | ICD-10-CM

## 2019-01-02 DIAGNOSIS — R404 Transient alteration of awareness: Secondary | ICD-10-CM | POA: Diagnosis not present

## 2019-01-02 DIAGNOSIS — G20 Parkinson's disease: Secondary | ICD-10-CM | POA: Diagnosis present

## 2019-01-02 DIAGNOSIS — R531 Weakness: Secondary | ICD-10-CM | POA: Diagnosis not present

## 2019-01-02 DIAGNOSIS — I340 Nonrheumatic mitral (valve) insufficiency: Secondary | ICD-10-CM | POA: Diagnosis not present

## 2019-01-02 DIAGNOSIS — F319 Bipolar disorder, unspecified: Secondary | ICD-10-CM | POA: Diagnosis present

## 2019-01-02 DIAGNOSIS — I639 Cerebral infarction, unspecified: Secondary | ICD-10-CM | POA: Diagnosis not present

## 2019-01-02 DIAGNOSIS — G9349 Other encephalopathy: Secondary | ICD-10-CM | POA: Diagnosis present

## 2019-01-02 DIAGNOSIS — I361 Nonrheumatic tricuspid (valve) insufficiency: Secondary | ICD-10-CM | POA: Diagnosis not present

## 2019-01-02 DIAGNOSIS — I252 Old myocardial infarction: Secondary | ICD-10-CM

## 2019-01-02 DIAGNOSIS — N390 Urinary tract infection, site not specified: Secondary | ICD-10-CM | POA: Diagnosis present

## 2019-01-02 DIAGNOSIS — I119 Hypertensive heart disease without heart failure: Secondary | ICD-10-CM | POA: Diagnosis present

## 2019-01-02 DIAGNOSIS — R339 Retention of urine, unspecified: Secondary | ICD-10-CM | POA: Diagnosis present

## 2019-01-02 DIAGNOSIS — I10 Essential (primary) hypertension: Secondary | ICD-10-CM | POA: Diagnosis not present

## 2019-01-02 DIAGNOSIS — Z951 Presence of aortocoronary bypass graft: Secondary | ICD-10-CM

## 2019-01-02 DIAGNOSIS — R4182 Altered mental status, unspecified: Secondary | ICD-10-CM

## 2019-01-02 DIAGNOSIS — G629 Polyneuropathy, unspecified: Secondary | ICD-10-CM | POA: Diagnosis present

## 2019-01-02 LAB — URINALYSIS, COMPLETE (UACMP) WITH MICROSCOPIC
Bilirubin Urine: NEGATIVE
Glucose, UA: NEGATIVE mg/dL
Ketones, ur: NEGATIVE mg/dL
Nitrite: NEGATIVE
Protein, ur: 100 mg/dL — AB
Specific Gravity, Urine: 1.018 (ref 1.005–1.030)
WBC, UA: >50 WBC/hpf — ABNORMAL HIGH (ref 0–5)
pH: 5 (ref 5.0–8.0)

## 2019-01-02 LAB — COMPREHENSIVE METABOLIC PANEL
ALT: 16 U/L (ref 0–44)
AST: 15 U/L (ref 15–41)
Albumin: 4.2 g/dL (ref 3.5–5.0)
Alkaline Phosphatase: 64 U/L (ref 38–126)
Anion gap: 7 (ref 5–15)
BUN: 20 mg/dL (ref 8–23)
CO2: 26 mmol/L (ref 22–32)
Calcium: 9.1 mg/dL (ref 8.9–10.3)
Chloride: 107 mmol/L (ref 98–111)
Creatinine, Ser: 1.07 mg/dL (ref 0.61–1.24)
GFR calc Af Amer: >60 mL/min (ref >60)
GFR calc non Af Amer: >60 mL/min (ref >60)
Glucose, Bld: 111 mg/dL — ABNORMAL HIGH (ref 70–99)
Potassium: 4 mmol/L (ref 3.5–5.1)
Sodium: 140 mmol/L (ref 135–145)
Total Bilirubin: 0.6 mg/dL (ref 0.3–1.2)
Total Protein: 7.2 g/dL (ref 6.5–8.1)

## 2019-01-02 LAB — CBC WITH DIFFERENTIAL/PLATELET
Abs Immature Granulocytes: 0.06 10*3/uL (ref 0.00–0.07)
Basophils Absolute: 0 10*3/uL (ref 0.0–0.1)
Basophils Relative: 0 %
Eosinophils Absolute: 0.2 10*3/uL (ref 0.0–0.5)
Eosinophils Relative: 2 %
HCT: 37.6 % — ABNORMAL LOW (ref 39.0–52.0)
Hemoglobin: 11.6 g/dL — ABNORMAL LOW (ref 13.0–17.0)
Immature Granulocytes: 1 %
Lymphocytes Relative: 15 %
Lymphs Abs: 1.2 10*3/uL (ref 0.7–4.0)
MCH: 28.4 pg (ref 26.0–34.0)
MCHC: 30.9 g/dL (ref 30.0–36.0)
MCV: 91.9 fL (ref 80.0–100.0)
Monocytes Absolute: 0.8 10*3/uL (ref 0.1–1.0)
Monocytes Relative: 9 %
Neutro Abs: 6.2 10*3/uL (ref 1.7–7.7)
Neutrophils Relative %: 73 %
Platelets: 162 10*3/uL (ref 150–400)
RBC: 4.09 MIL/uL — ABNORMAL LOW (ref 4.22–5.81)
RDW: 14.2 % (ref 11.5–15.5)
WBC: 8.5 10*3/uL (ref 4.0–10.5)
nRBC: 0 % (ref 0.0–0.2)

## 2019-01-02 LAB — GLUCOSE, CAPILLARY
Glucose-Capillary: 87 mg/dL (ref 70–99)
Glucose-Capillary: 78 mg/dL (ref 70–99)
Glucose-Capillary: 136 mg/dL — ABNORMAL HIGH (ref 70–99)

## 2019-01-02 LAB — SARS CORONAVIRUS 2 BY RT PCR (HOSPITAL ORDER, PERFORMED IN ~~LOC~~ HOSPITAL LAB): SARS Coronavirus 2: NEGATIVE

## 2019-01-02 MED ORDER — SODIUM CHLORIDE 0.9 % IV SOLN
1.0000 g | INTRAVENOUS | Status: DC
  Administered 2019-01-03 – 2019-01-04 (×2): 1 g via INTRAVENOUS
  Filled 2019-01-02 (×2): qty 1

## 2019-01-02 MED ORDER — INFLUENZA VAC A&B SA ADJ QUAD 0.5 ML IM PRSY
0.5000 mL | PREFILLED_SYRINGE | INTRAMUSCULAR | Status: DC
  Filled 2019-01-02: qty 0.5

## 2019-01-02 MED ORDER — ACETAMINOPHEN 650 MG RE SUPP: 650.0000 mg | Freq: Four times a day (QID) | RECTAL | Status: DC | PRN

## 2019-01-02 MED ORDER — ONDANSETRON HCL 4 MG PO TABS: 4.0000 mg | ORAL_TABLET | Freq: Four times a day (QID) | ORAL | Status: DC | PRN

## 2019-01-02 MED ORDER — SODIUM CHLORIDE 0.9 % IV SOLN
INTRAVENOUS | Status: DC
  Administered 2019-01-02 – 2019-01-03 (×3): via INTRAVENOUS

## 2019-01-02 MED ORDER — METFORMIN HCL 500 MG PO TABS
1000.0000 mg | ORAL_TABLET | Freq: Two times a day (BID) | ORAL | Status: DC
  Administered 2019-01-04: 10:00:00 1000 mg via ORAL
  Filled 2019-01-02 (×2): qty 2

## 2019-01-02 MED ORDER — PNEUMOCOCCAL VAC POLYVALENT 25 MCG/0.5ML IJ INJ
0.5000 mL | INJECTION | INTRAMUSCULAR | Status: AC
  Administered 2019-01-03: 0.5 mL via INTRAMUSCULAR
  Filled 2019-01-02: qty 0.5

## 2019-01-02 MED ORDER — TAMSULOSIN HCL 0.4 MG PO CAPS
0.4000 mg | ORAL_CAPSULE | Freq: Every day | ORAL | Status: DC
  Administered 2019-01-02 – 2019-01-04 (×3): 0.4 mg via ORAL
  Filled 2019-01-02 (×3): qty 1

## 2019-01-02 MED ORDER — INSULIN ASPART 100 UNIT/ML ~~LOC~~ SOLN
0.0000 [IU] | Freq: Three times a day (TID) | SUBCUTANEOUS | Status: DC
  Filled 2019-01-02: qty 1

## 2019-01-02 MED ORDER — BISACODYL 5 MG PO TBEC: 5.0000 mg | DELAYED_RELEASE_TABLET | Freq: Every day | ORAL | Status: DC | PRN

## 2019-01-02 MED ORDER — ASPIRIN EC 81 MG PO TBEC
81.0000 mg | DELAYED_RELEASE_TABLET | Freq: Every day | ORAL | Status: DC
  Administered 2019-01-02 – 2019-01-04 (×3): 81 mg via ORAL
  Filled 2019-01-02 (×5): qty 1

## 2019-01-02 MED ORDER — ORAL CARE MOUTH RINSE
15.0000 mL | Freq: Two times a day (BID) | OROMUCOSAL | Status: DC
  Administered 2019-01-02 – 2019-01-04 (×4): 15 mL via OROMUCOSAL

## 2019-01-02 MED ORDER — METOPROLOL TARTRATE 5 MG/5ML IV SOLN
5.0000 mg | Freq: Four times a day (QID) | INTRAVENOUS | Status: DC | PRN
  Administered 2019-01-02 – 2019-01-03 (×3): 5 mg via INTRAVENOUS
  Filled 2019-01-02 (×3): qty 5

## 2019-01-02 MED ORDER — LINAGLIPTIN 5 MG PO TABS
5.0000 mg | ORAL_TABLET | Freq: Every day | ORAL | Status: DC
  Administered 2019-01-03 – 2019-01-04 (×2): 5 mg via ORAL
  Filled 2019-01-02 (×3): qty 1

## 2019-01-02 MED ORDER — SITAGLIPTIN PHOS-METFORMIN HCL 50-1000 MG PO TABS: 1.0000 | ORAL_TABLET | Freq: Two times a day (BID) | ORAL | Status: DC

## 2019-01-02 MED ORDER — TRAZODONE HCL 50 MG PO TABS
25.0000 mg | ORAL_TABLET | Freq: Every evening | ORAL | Status: DC | PRN
  Administered 2019-01-03: 22:00:00 25 mg via ORAL
  Filled 2019-01-02: qty 1

## 2019-01-02 MED ORDER — SODIUM CHLORIDE 0.9 % IV SOLN
1000.0000 mL | Freq: Once | INTRAVENOUS | Status: AC
  Administered 2019-01-02: 11:00:00 1000 mL via INTRAVENOUS

## 2019-01-02 MED ORDER — PANTOPRAZOLE SODIUM 40 MG PO TBEC
40.0000 mg | DELAYED_RELEASE_TABLET | Freq: Every day | ORAL | Status: DC
  Administered 2019-01-02 – 2019-01-04 (×3): 40 mg via ORAL
  Filled 2019-01-02 (×3): qty 1

## 2019-01-02 MED ORDER — HEPARIN SODIUM (PORCINE) 5000 UNIT/ML IJ SOLN
5000.0000 [IU] | Freq: Three times a day (TID) | INTRAMUSCULAR | Status: DC
  Administered 2019-01-02 – 2019-01-04 (×5): 5000 [IU] via SUBCUTANEOUS
  Filled 2019-01-02 (×5): qty 1

## 2019-01-02 MED ORDER — ONDANSETRON HCL 4 MG/2ML IJ SOLN: 4.0000 mg | Freq: Four times a day (QID) | INTRAMUSCULAR | Status: DC | PRN

## 2019-01-02 MED ORDER — SODIUM CHLORIDE 0.9 % IV SOLN
1.0000 g | Freq: Once | INTRAVENOUS | Status: AC
  Administered 2019-01-02: 1 g via INTRAVENOUS
  Filled 2019-01-02: qty 10

## 2019-01-02 MED ORDER — ACETAMINOPHEN 325 MG PO TABS
650.0000 mg | ORAL_TABLET | Freq: Four times a day (QID) | ORAL | Status: DC | PRN
  Administered 2019-01-03: 650 mg via ORAL
  Filled 2019-01-02 (×2): qty 2

## 2019-01-02 MED ORDER — HYDRALAZINE HCL 20 MG/ML IJ SOLN
10.0000 mg | Freq: Four times a day (QID) | INTRAMUSCULAR | Status: DC | PRN
  Administered 2019-01-02 – 2019-01-03 (×3): 10 mg via INTRAVENOUS
  Filled 2019-01-02 (×3): qty 1

## 2019-01-02 MED ORDER — DOXEPIN HCL 50 MG PO CAPS
50.0000 mg | ORAL_CAPSULE | Freq: Every day | ORAL | Status: DC
  Administered 2019-01-02 – 2019-01-03 (×2): 50 mg via ORAL
  Filled 2019-01-02 (×3): qty 1

## 2019-01-02 NOTE — ED Provider Notes (Signed)
[facility] Emergency Department Provider Note       Time seen: ----------------------------------------- 10:29 AM on 01/02/2019 -----------------------------------------  Level V caveat: History/ROS limited by altered mental status I have reviewed the triage vital signs and the nursing notes.  HISTORY   Chief Complaint Altered Mental Status    HPI Roger Pruitt is a 68 y.o. male with a history of UTI, anemia, arthritis, bipolar disorder, coronary artery disease, depression, hyperlipidemia, hypertension, OCD, anxiety, Parkinson's disease who presents to the ED for altered mental status.  Reportedly he recently had a bladder infection and was exhibiting similar symptoms according to his wife.  No further information is available.  Past Medical History:  Diagnosis Date  . Abdominal pain   . Acute cystitis without hematuria   . Anemia   . Anginal pain ([place])    rarely  . Arthritis    knees and hands  . Asthma   . Bipolar disorder ([place])    BIPOLAR/ SCHIZO/ AFFECTIVE   . BPD (bronchopulmonary dysplasia)   . BPH (benign prostatic hyperplasia)   . CAD (coronary artery disease)    a. s/p MI 2001, 2012;  b. s/p prior LCX stenting; b. 05/2012 Abnl MV; c. 05/2012 Cath: patent LCX stent w/ otw mod non-obs dzs->Med Rx.  . Carpal tunnel syndrome    surgery both hands  . Depression   . Disc degeneration, lumbar   . Dyspnea    WITH EXERTION  . Elevated PSA   . Esophageal reflux   . GERD (gastroesophageal reflux disease)   . Hematuria    history of gross and microscopic  . Hemorrhoid   . History of balanitis   . History of bladder infections   . History of echocardiogram    a. 07/2014 Echo: EF 60-65%, no rwma, nl LA size, nl RV/PASP.  Marland Kitchen History of urethral stricture   . Hyperlipidemia   . Hypertension    controlled  . Hypertensive heart disease   . Incomplete bladder emptying   . Iron deficiency anemia, unspecified   . Lower urinary tract symptoms   .  Myocardial infarction ([place])    X7 LAST 2001  . Neuropathy   . Nocturia   . Obsessive compulsive disorder   . Other dysphagia   . Panic attack   . Parkinson's disease ([place])   . Sleep apnea    SURGICAL TX  . Stroke ([place])   . Type II diabetes mellitus ([place])   . Unspecified vitamin D deficiency   . Urethral stricture   . Urinary retention   . Varicose vein of leg   . Vertigo    hx of  . Vitamin D deficiency   . Wears dentures    upper and lowers    Patient Active Problem List   Diagnosis Date Noted  . Altered mental status 12/10/2018  . Chest pain 09/05/2018  . Iron deficiency anemia due to chronic blood loss   . Benign neoplasm of ascending colon   . Benign neoplasm of cecum   . Gastritis without bleeding   . Parkinsonism due to drug ([place]) 07/22/2016  . Chronic bilateral low back pain without sciatica 04/07/2016  . Syncope 12/31/2015  . Status post coronary artery bypass grafting 12/31/2015  . S/P CABG x 4 12/25/2015  . Unstable angina ([place]) 12/01/2015  . Hypertensive heart disease   . Type II diabetes mellitus ([place])   . Abnormal chest CT 07/10/2015  . Pulmonary nodules 07/09/2015  . Basal cell carcinoma  of skin 05/27/2015  . Bipolar disorder ([place]) 05/27/2015  . CAD (coronary artery disease), native coronary artery 05/27/2015  . DDD (degenerative disc disease), lumbosacral 05/27/2015  . Esophageal reflux 05/27/2015  . Benign essential hypertension 05/27/2015  . Sleep apnea 05/27/2015  . Urethral stricture 05/27/2015  . Benign prostatic hypertrophy (BPH) with incomplete bladder emptying 11/29/2014  . CVA (cerebral infarction) 08/23/2014  . Hypertension   . S/P coronary artery stent placement 09/30/2011  . Hyperlipidemia 09/30/2011  . History of myocardial infarction 09/03/2011  . Seasonal allergies 09/03/2011    Past Surgical History:  Procedure Laterality Date  . BACK SURGERY     lumbar disc  . BACK SURGERY  1984  . CARDIAC CATHETERIZATION   6/10;2011;Aug.2012;Oct. 2012;   . CARDIAC CATHETERIZATION  05/2012   [facility]; EF 60%, patent stent in the left circumflex, moderate three-vessel disease with no flow-limiting lesions. Unchanged from most recent catheterization  . CARDIAC CATHETERIZATION N/A 12/10/2015   Procedure: Left Heart Cath and Coronary Angiography;  Surgeon: Corey Skains, MD;  Location: [facility];  Service: Cardiovascular;  Laterality: N/A;  . CARPAL TUNNEL RELEASE  2009   left  . CATARACT EXTRACTION W/PHACO Left 02/14/2017   Procedure: CATARACT EXTRACTION PHACO AND INTRAOCULAR LENS PLACEMENT (IOC);  Surgeon: Birder Robson, MD;  Location: [facility];  Service: Ophthalmology;  Laterality: Left;  [place] 00:42 AP% 16.6 CDE 7.14 Fluid pack lot # [id number]  . CATARACT EXTRACTION W/PHACO Right 02/28/2017   Procedure: CATARACT EXTRACTION PHACO AND INTRAOCULAR LENS PLACEMENT (IOC);  Surgeon: Birder Robson, MD;  Location: [facility];  Service: Ophthalmology;  Laterality: Right;  [place] 01:09 AP% 11.4 CDE 7.98 Fluid pack lot # [id number]  . COLONOSCOPY WITH PROPOFOL N/A 06/29/2017   Procedure: COLONOSCOPY WITH PROPOFOL;  Surgeon: [name], MD;  Location: [facility];  Service: Endoscopy;  Laterality: N/A;  Diabetic - oral meds  . CORONARY ANGIOPLASTY  2011 & 2001   s/p stent  . CORONARY ARTERY BYPASS GRAFT     stent placement  . CORONARY ARTERY BYPASS GRAFT N/A 12/25/2015   Procedure: CORONARY ARTERY BYPASS GRAFTING (CABG) x4  , using left internal mammary artery and right leg greater saphenous vein harvested endoscopically LIMA-LAD SVG-OM2 SVG-DIAG SVG-PD;  Surgeon: Melrose Nakayama, MD;  Location: [facility];  Service: Open Heart Surgery;  Laterality: N/A;  . [place]  . CYST REMOVAL LEG Right   . ESOPHAGOGASTRODUODENOSCOPY  06/29/2017   Procedure: ESOPHAGOGASTRODUODENOSCOPY (EGD);  Surgeon: [name], MD;  Location: [facility];  Service: Endoscopy;;  . POLYPECTOMY  06/29/2017   Procedure:  POLYPECTOMY INTESTINAL;  Surgeon: [name], MD;  Location: [place];  Service: Endoscopy;;  . SKIN GRAFT    . TEE WITHOUT CARDIOVERSION N/A 12/25/2015   Procedure: TRANSESOPHAGEAL ECHOCARDIOGRAM (TEE);  Surgeon: Melrose Nakayama, MD;  Location: [facility];  Service: Open Heart Surgery;  Laterality: N/A;  . TONSILLECTOMY    . UVULOPALATOPHARYNGOPLASTY      Allergies Morphine and related, Synalgos-dc [aspirin-caff-dihydrocodeine], and Synchlor a [chlorpheniramine]  Social History Social History   Tobacco Use  . Smoking status: Former Smoker    Packs/day: 0.50    Years: 15.00    Pack years: 7.50    Types: Cigarettes    Quit date: 05/18/1999    Years since quitting: 19.6  . Smokeless tobacco: Never Used  . Tobacco comment: quit 17 + years  Substance Use Topics  . Alcohol use: No    Alcohol/week: 0.0 standard  drinks  . Drug use: No    Review of Systems Unknown, patient has altered mental status  All systems negative/normal/unremarkable except as stated in the HPI  ____________________________________________   PHYSICAL EXAM:  VITAL SIGNS: ED Triage Vitals [01/02/19 1028]  Enc Vitals Group     BP      Pulse      Resp      Temp      Temp src      SpO2      Weight      Height      Head Circumference      Peak Flow      Pain Score 8     Pain Loc      Pain Edu?      Excl. in [place]?     Constitutional: Lethargic, no distress. Eyes: Conjunctivae are normal. Normal extraocular movements. ENT      Head: Normocephalic and atraumatic.      Nose: No congestion/rhinnorhea.      Mouth/Throat: Mucous membranes are moist.      Neck: No stridor. Cardiovascular: Normal rate, regular rhythm. No murmurs, rubs, or gallops. Respiratory: Normal respiratory effort without tachypnea nor retractions. Breath sounds are clear and equal bilaterally. No wheezes/rales/rhonchi. Gastrointestinal: Soft and nontender. Normal bowel sounds Musculoskeletal: Nontender with normal  range of motion in extremities. No lower extremity tenderness nor edema. Neurologic: Slurred speech, no gross focal neurologic deficits are appreciated.  Patient does not open his eyes when talking but will open his eyes to command and follow commands. Skin:  Skin is warm, dry and intact. No rash noted. Psychiatric: Slurred speech ____________________________________________  EKG: Interpreted by me.  Sinus rhythm rate of 58 bpm, prolonged PR interval, LVH, left axis deviation  ____________________________________________  ED COURSE:  As part of my medical decision making, I reviewed the following data within the [place] History obtained from family if available, nursing notes, old chart and ekg, as well as notes from prior ED visits. Patient presented for altered mental status, we will assess with labs and imaging as indicated at this time.   Procedures  Luisenrique Adoni Heinig was evaluated in Emergency Department on 01/02/2019 for the symptoms described in the history of present illness. He was evaluated in the context of the global COVID-19 pandemic, which necessitated consideration that the patient might be at risk for infection with the SARS-CoV-2 virus that causes COVID-19. Institutional protocols and algorithms that pertain to the evaluation of patients at risk for COVID-19 are in a state of rapid change based on information released by regulatory bodies including the CDC and federal and state organizations. These policies and algorithms were followed during the patient's care in the ED.  ____________________________________________   LABS (pertinent positives/negatives)  Labs Reviewed  COMPREHENSIVE METABOLIC PANEL - Abnormal; Notable for the following components:      Result Value   Glucose, Bld 111 (*)    All other components within normal limits  URINALYSIS, COMPLETE (UACMP) WITH MICROSCOPIC - Abnormal; Notable for the following components:   Color, Urine YELLOW (*)     APPearance TURBID (*)    Hgb urine dipstick SMALL (*)    Protein, ur 100 (*)    Leukocytes,Ua LARGE (*)    WBC, UA >50 (*)    Bacteria, UA MANY (*)    All other components within normal limits  CBC WITH DIFFERENTIAL/PLATELET - Abnormal; Notable for the following components:   RBC 4.09 (*)    Hemoglobin  11.6 (*)    HCT 37.6 (*)    All other components within normal limits  URINE CULTURE  CBC WITH DIFFERENTIAL/PLATELET  CBG MONITORING, ED  ____________________________________________   DIFFERENTIAL DIAGNOSIS   Dehydration, electrolyte abnormality, medication side effect, intoxication, CVA, occult infection  FINAL ASSESSMENT AND PLAN  Altered mental status, UTI   Plan: The patient had presented for altered mental status. Patient's labs did indicate that he has a urinary tract infection.  He was given fluids and IV Rocephin and we sent a urine culture.  CT head was unremarkable again for any acute process.  He still remains altered and lethargic.  I will discuss with the hospitalist for admission.   Laurence Aly, MD    Note: This note was generated in part or whole with voice recognition software. Voice recognition is usually quite accurate but there are transcription errors that can and very often do occur. I apologize for any typographical errors that were not detected and corrected.     Earleen Newport, MD 01/02/19 [phone]

## 2019-01-02 NOTE — ED Triage Notes (Signed)
Pt comes from home with AMS. Pt arousable to voice. Recently here for bladder infection and exhibiting similar symptoms per wife. Pt CBG 160 with other VSS. Pt has hx of stroke. Negative neuro exam with EMS. Able to help ambulate to stretcher with EMS.

## 2019-01-02 NOTE — Progress Notes (Signed)
RN notified MD pt BG was 78 and pt currently NPO. No new order from MD. MD stated that if pt BG gets low to notified her. Pt is currently NPO bc he is lethargic and its not fully awake. Per MD if pt gets more awake he can possible have a diet order. RN will continue to monitor pt. Also per MD gave orders for IV metoprolol 5 mg IV PRN Q6 if parameters are met.

## 2019-01-02 NOTE — H&P (Signed)
[facility] [place] NAME: Roger Pruitt    MR#:  [id number]  DATE OF BIRTH:  07-05-1950  DATE OF ADMISSION:  01/02/2019  PRIMARY CARE PHYSICIAN: Casilda Carls, MD   REQUESTING/REFERRING PHYSICIAN: Dr. Lenise Arena  CHIEF COMPLAINT: Altered mental status   Chief Complaint  Patient presents with  . Altered Mental Status    HISTORY OF PRESENT ILLNESS:  Roger Pruitt  is a 68 y.o. male with a known history of bipolar disorder, hypertension BPH, diabetes mellitus type 2, CAD status post CABG comes in because of altered mental status, brought by family.  Patient found to have UTI.  Afebrile.  And recently was admitted last month for altered mental status secondary to polypharmacy, urine retention.  PAST MEDICAL HISTORY:   Past Medical History:  Diagnosis Date  . Abdominal pain   . Acute cystitis without hematuria   . Anemia   . Anginal pain ([place])    rarely  . Arthritis    knees and hands  . Asthma   . Bipolar disorder ([place])    BIPOLAR/ SCHIZO/ AFFECTIVE   . BPD (bronchopulmonary dysplasia)   . BPH (benign prostatic hyperplasia)   . CAD (coronary artery disease)    a. s/p MI 2001, 2012;  b. s/p prior LCX stenting; b. 05/2012 Abnl MV; c. 05/2012 Cath: patent LCX stent w/ otw mod non-obs dzs->Med Rx.  . Carpal tunnel syndrome    surgery both hands  . Depression   . Disc degeneration, lumbar   . Dyspnea    WITH EXERTION  . Elevated PSA   . Esophageal reflux   . GERD (gastroesophageal reflux disease)   . Hematuria    history of gross and microscopic  . Hemorrhoid   . History of balanitis   . History of bladder infections   . History of echocardiogram    a. 07/2014 Echo: EF 60-65%, no rwma, nl LA size, nl RV/PASP.  Marland Kitchen History of urethral stricture   . Hyperlipidemia   . Hypertension    controlled  . Hypertensive heart disease   . Incomplete bladder emptying   . Iron deficiency anemia, unspecified   . Lower urinary tract  symptoms   . Myocardial infarction ([place])    X7 LAST 2001  . Neuropathy   . Nocturia   . Obsessive compulsive disorder   . Other dysphagia   . Panic attack   . Parkinson's disease ([place])   . Sleep apnea    SURGICAL TX  . Stroke ([place])   . Type II diabetes mellitus ([place])   . Unspecified vitamin D deficiency   . Urethral stricture   . Urinary retention   . Varicose vein of leg   . Vertigo    hx of  . Vitamin D deficiency   . Wears dentures    upper and lowers    PAST SURGICAL HISTOIRY:   Past Surgical History:  Procedure Laterality Date  . BACK SURGERY     lumbar disc  . BACK SURGERY  1984  . CARDIAC CATHETERIZATION  6/10;2011;Aug.2012;Oct. 2012;   . CARDIAC CATHETERIZATION  05/2012   [facility]; EF 60%, patent stent in the left circumflex, moderate three-vessel disease with no flow-limiting lesions. Unchanged from most recent catheterization  . CARDIAC CATHETERIZATION N/A 12/10/2015   Procedure: Left Heart Cath and Coronary Angiography;  Surgeon: Corey Skains, MD;  Location: [facility];  Service: Cardiovascular;  Laterality: N/A;  . CARPAL TUNNEL RELEASE  2009   left  . CATARACT EXTRACTION W/PHACO Left 02/14/2017   Procedure: CATARACT EXTRACTION PHACO AND INTRAOCULAR LENS PLACEMENT (IOC);  Surgeon: Birder Robson, MD;  Location: [facility];  Service: Ophthalmology;  Laterality: Left;  [place] 00:42 AP% 16.6 CDE 7.14 Fluid pack lot # [id number]  . CATARACT EXTRACTION W/PHACO Right 02/28/2017   Procedure: CATARACT EXTRACTION PHACO AND INTRAOCULAR LENS PLACEMENT (IOC);  Surgeon: Birder Robson, MD;  Location: [facility];  Service: Ophthalmology;  Laterality: Right;  [place] 01:09 AP% 11.4 CDE 7.98 Fluid pack lot # [id number]  . COLONOSCOPY WITH PROPOFOL N/A 06/29/2017   Procedure: COLONOSCOPY WITH PROPOFOL;  Surgeon: [name], MD;  Location: [facility];  Service: Endoscopy;  Laterality: N/A;  Diabetic - oral meds  . CORONARY ANGIOPLASTY  2011 & 2001   s/p stent  .  CORONARY ARTERY BYPASS GRAFT     stent placement  . CORONARY ARTERY BYPASS GRAFT N/A 12/25/2015   Procedure: CORONARY ARTERY BYPASS GRAFTING (CABG) x4  , using left internal mammary artery and right leg greater saphenous vein harvested endoscopically LIMA-LAD SVG-OM2 SVG-DIAG SVG-PD;  Surgeon: Melrose Nakayama, MD;  Location: [facility];  Service: Open Heart Surgery;  Laterality: N/A;  . [place]  . CYST REMOVAL LEG Right   . ESOPHAGOGASTRODUODENOSCOPY  06/29/2017   Procedure: ESOPHAGOGASTRODUODENOSCOPY (EGD);  Surgeon: [name], MD;  Location: [facility];  Service: Endoscopy;;  . POLYPECTOMY  06/29/2017   Procedure: POLYPECTOMY INTESTINAL;  Surgeon: [name], MD;  Location: [facility];  Service: Endoscopy;;  . SKIN GRAFT    . TEE WITHOUT CARDIOVERSION N/A 12/25/2015   Procedure: TRANSESOPHAGEAL ECHOCARDIOGRAM (TEE);  Surgeon: Melrose Nakayama, MD;  Location: [facility];  Service: Open Heart Surgery;  Laterality: N/A;  . TONSILLECTOMY    . UVULOPALATOPHARYNGOPLASTY      SOCIAL HISTORY:   Social History   Tobacco Use  . Smoking status: Former Smoker    Packs/day: 0.50    Years: 15.00    Pack years: 7.50    Types: Cigarettes    Quit date: 05/18/1999    Years since quitting: 19.6  . Smokeless tobacco: Never Used  . Tobacco comment: quit 17 + years  Substance Use Topics  . Alcohol use: No    Alcohol/week: 0.0 standard drinks    FAMILY HISTORY:   Family History  Problem Relation Age of Onset  . Heart disease Mother   . Heart disease Brother   . Prostate cancer Father   . Heart disease Other   . Heart disease Brother   . Kidney disease Neg Hx   . Kidney cancer Neg Hx   . Bladder Cancer Neg Hx     DRUG ALLERGIES:   Allergies  Allergen Reactions  . Morphine And Related Nausea And Vomiting  . Synalgos-Dc [Aspirin-Caff-Dihydrocodeine] Itching and Other (See Comments)    Reaction:  Stinging   . Synchlor A [Chlorpheniramine] Rash and Other  (See Comments)    Patient doesn't recall this allergy.    REVIEW OF SYSTEMS:  Patient is lethargic, not able to give review of systems.  MEDICATIONS AT HOME:   Prior to Admission medications   Medication Sig Start Date End Date Taking? Authorizing Provider  acetaminophen (TYLENOL) 500 MG tablet Take 1,000 mg by mouth every 6 (six) hours as needed for moderate pain or headache.     [provider]  aspirin (ASPIRIN 81) 81 MG EC tablet Take 81 mg by mouth daily.  [provider]  atorvastatin (LIPITOR) 40 MG tablet Take 1 tablet (40 mg total) by mouth daily at 6 PM. 12/30/15   Barrett, Erin R, PA-C  buPROPion (WELLBUTRIN XL) 300 MG 24 hr tablet Take 300 mg by mouth daily. noon    [provider]  carvedilol (COREG) 12.5 MG tablet Take 12.5 mg by mouth 2 (two) times daily.     [provider]  diazepam (VALIUM) 10 MG tablet Take 30 minutes prior to MRI 12/24/18   Zara Council A, PA-C  doxepin (SINEQUAN) 25 MG capsule Take 50 mg by mouth at bedtime.     [provider]  fluticasone (FLONASE) 50 MCG/ACT nasal spray Place into the nose. 06/19/18   [provider]  gabapentin (NEURONTIN) 300 MG capsule Take 300 mg by mouth 3 (three) times daily.     [provider]  hydrALAZINE (APRESOLINE) 25 MG tablet Take 1 tablet (25 mg total) by mouth every 8 (eight) hours. 12/13/18   Kate Sable, MD  Ipratropium-Albuterol (COMBIVENT RESPIMAT) 20-100 MCG/ACT AERS respimat INHALE ONE PUFF INTO THE LUNGS EVERY 6 HOURS Patient taking differently: Inhale 1 puff into the lungs every 6 (six) hours.  11/21/18   Flora Lipps, MD  isosorbide mononitrate (IMDUR) 30 MG 24 hr tablet Take 30 mg by mouth daily. 08/13/18   [provider]  JANUMET 50-1000 MG tablet Take 1 tablet by mouth 2 (two) times daily. Am and pm 08/02/16   [provider]  lisinopril (PRINIVIL,ZESTRIL) 20 MG tablet Take 20 mg by mouth 2 (two) times daily.      [provider]  LORazepam (ATIVAN) 1 MG tablet Take 1-2 tablets (1-2 mg total) by mouth See admin instructions. Take 1 mg by mouth in the morning and take 2 mg by mouth at bedtime 12/12/18   Fritzi Mandes, MD  nitroGLYCERIN (NITROLINGUAL) 0.4 MG/SPRAY spray Place 1 spray under the tongue every 5 (five) minutes x 3 doses as needed for chest pain.    [provider]  pantoprazole (PROTONIX) 40 MG tablet Take 40 mg by mouth daily.     [provider]  risperidone (RISPERDAL) 1 MG tablet Take 1 tablet (1 mg total) by mouth at bedtime. 12/12/18   Fritzi Mandes, MD  SYMBICORT 160-4.5 MCG/ACT inhaler INHALE TWO PUFFS INTO THE LUNGS TWICE DAILY 12/20/18   Flora Lipps, MD  tamsulosin (FLOMAX) 0.4 MG CAPS capsule Take 1 capsule (0.4 mg total) by mouth daily. 09/26/18   Zara Council A, PA-C  topiramate (TOPAMAX) 100 MG tablet Take 100 mg by mouth daily at 12 noon.     [provider]  traMADol (ULTRAM) 50 MG tablet Take 1 tablet (50 mg total) by mouth every 8 (eight) hours as needed. Patient taking differently: Take 50 mg by mouth every 12 (twelve) hours as needed for moderate pain.  07/20/17   Coral Spikes, DO      VITAL SIGNS:  Blood pressure (!) 183/99, pulse (!) 59, temperature 97.8 F (36.6 C), temperature source Oral, resp. rate 15, height 6\' 3"  (1.905 m), weight 100 kg, SpO2 100 %.  PHYSICAL EXAMINATION:  GENERAL:  69 y.o.-year-old patient lying in the bed, speech is slurred EYES: Pupils equal, round, reactive to light   No scleral icterus. Extraocular muscles intact.  HEENT: Head atraumatic, normocephalic. Oropharynx and nasopharynx clear.  NECK:  Supple, no jugular venous distention. No thyroid enlargement, no tenderness.  LUNGS: Normal breath sounds bilaterally, no wheezing, rales,rhonchi or crepitation. No use  of accessory muscles of respiration.  CARDIOVASCULAR: S1, S2 normal. No murmurs, rubs, or gallops.  ABDOMEN: Soft, nontender, nondistended. Bowel sounds  present. No organomegaly or mass.  EXTREMITIES: No pedal edema, cyanosis, or clubbing.  NEUROLOGIC: Lethargic, not able to follow commands for full neuro exam.  PSYCHIATRIC: LETHARGIC.  SKIN: No obvious rash, lesion, or ulcer.   LABORATORY PANEL:   CBC Recent Labs  Lab 01/02/19 1033  WBC 8.5  HGB 11.6*  HCT 37.6*  PLT 162   ------------------------------------------------------------------------------------------------------------------  Chemistries  Recent Labs  Lab 01/02/19 1033  NA 140  K 4.0  CL 107  CO2 26  GLUCOSE 111*  BUN 20  CREATININE 1.07  CALCIUM 9.1  AST 15  ALT 16  ALKPHOS 64  BILITOT 0.6   ------------------------------------------------------------------------------------------------------------------  Cardiac Enzymes No results for input(s): TROPONINI in the last 168 hours. ------------------------------------------------------------------------------------------------------------------  RADIOLOGY:  Ct Head Wo Contrast  Result Date: 01/02/2019 CLINICAL DATA:  Altered level of consciousness. EXAM: CT HEAD WITHOUT CONTRAST TECHNIQUE: Contiguous axial images were obtained from the base of the skull through the vertex without intravenous contrast. COMPARISON:  12/10/2018 FINDINGS: Brain: There is no evidence for acute hemorrhage, hydrocephalus, mass lesion, or abnormal extra-axial fluid collection. No definite CT evidence for acute infarction. Diffuse loss of parenchymal volume is consistent with atrophy. Patchy low attenuation in the deep hemispheric and periventricular white matter is nonspecific, but likely reflects chronic microvascular ischemic demyelination. Small lacunar infarcts are seen in the basal ganglia bilaterally. Vascular: No hyperdense vessel or unexpected calcification. Skull: No evidence for fracture. No worrisome lytic or sclerotic lesion. Sinuses/Orbits: The visualized paranasal sinuses and mastoid air cells are clear. Visualized portions  of the globes and intraorbital fat are unremarkable. Other: None. IMPRESSION: Stable.  No acute intracranial abnormality. Atrophy with small vessel white matter disease in bilateral lacunar infarcts in the basal ganglia. Electronically Signed   By: Misty Stanley M.D.   On: 01/02/2019 13:50    EKG:   Orders placed or performed during the hospital encounter of 01/02/19  . ED EKG  . ED EKG  . EKG 12-Lead  . EKG 12-Lead    IMPRESSION AND PLAN:  Altered mental status secondary to UTI, continue Rocephin, follow urine cultures, continue n.p.o until patient is more alert. 2.  Recent urine retention, patient has massive prostate, has BPH, continue Flomax, continue Foley catheter. 3.  Essential hypertension, patient does have elevated blood pressure here, use hydralazine IV while patient is n.p.o.  Continue to hold Coreg, lisinopril, p.o. hydralazine while patient is n.p.o. 4.  Diabetes mellitus type 2, without use of long-term insulin with peripheral vascular disease, hold metformin, continue sliding scale insulin with coverage for now. 5.  CAD, CABG, patient has no EKG changes. #6 polypharmacy during recent admission, history of bipolar disorder, continue to hold patient Neurontin, Wellbutrin at this time as patient is very lethargic.   All the records are reviewed and case discussed with ED provider. Management plans discussed with the patient, family and they are in agreement.  CODE STATUS: full  TOTAL TIME TAKING CARE OF THIS PATIENT:55 minutes.    Epifanio Lesches M.D on 01/02/2019 at 3:12 PM  Between 7am to 6pm - Pager - [phone]  After 6pm go to [email or web address] - password [email or web address] [facility]  Office  [phone]  CC: Primary care physician; Casilda Carls, MD  Note: This dictation was prepared with Dragon dictation along with smaller phrase technology. Any transcriptional errors that result from this process  are unintentional.

## 2019-01-03 ENCOUNTER — Telehealth: Payer: Self-pay | Admitting: *Deleted

## 2019-01-03 LAB — URINE CULTURE
Culture: >=100000 — AB
Culture: >=100000 — AB

## 2019-01-03 LAB — BASIC METABOLIC PANEL
Anion gap: 6 (ref 5–15)
BUN: 23 mg/dL (ref 8–23)
CO2: 22 mmol/L (ref 22–32)
Calcium: 8.6 mg/dL — ABNORMAL LOW (ref 8.9–10.3)
Chloride: 112 mmol/L — ABNORMAL HIGH (ref 98–111)
Creatinine, Ser: 0.96 mg/dL (ref 0.61–1.24)
GFR calc Af Amer: >60 mL/min (ref >60)
GFR calc non Af Amer: >60 mL/min (ref >60)
Glucose, Bld: 125 mg/dL — ABNORMAL HIGH (ref 70–99)
Potassium: 3.8 mmol/L (ref 3.5–5.1)
Sodium: 140 mmol/L (ref 135–145)

## 2019-01-03 LAB — CBC
HCT: 39.5 % (ref 39.0–52.0)
Hemoglobin: 12.6 g/dL — ABNORMAL LOW (ref 13.0–17.0)
MCH: 28.4 pg (ref 26.0–34.0)
MCHC: 31.9 g/dL (ref 30.0–36.0)
MCV: 89.2 fL (ref 80.0–100.0)
Platelets: 182 10*3/uL (ref 150–400)
RBC: 4.43 MIL/uL (ref 4.22–5.81)
RDW: 14 % (ref 11.5–15.5)
WBC: 9.9 10*3/uL (ref 4.0–10.5)
nRBC: 0 % (ref 0.0–0.2)

## 2019-01-03 LAB — GLUCOSE, CAPILLARY
Glucose-Capillary: 125 mg/dL — ABNORMAL HIGH (ref 70–99)
Glucose-Capillary: 118 mg/dL — ABNORMAL HIGH (ref 70–99)
Glucose-Capillary: 98 mg/dL (ref 70–99)
Glucose-Capillary: 98 mg/dL (ref 70–99)

## 2019-01-03 LAB — LACTIC ACID, PLASMA
Lactic Acid, Venous: 1 mmol/L (ref 0.5–1.9)
Lactic Acid, Venous: 1.2 mmol/L (ref 0.5–1.9)

## 2019-01-03 LAB — PROCALCITONIN: Procalcitonin: <0.1 ng/mL

## 2019-01-03 MED ORDER — FLUTICASONE PROPIONATE 50 MCG/ACT NA SUSP
2.0000 | Freq: Every day | NASAL | Status: DC | PRN
  Filled 2019-01-03: qty 16

## 2019-01-03 MED ORDER — HYDRALAZINE HCL 25 MG PO TABS
25.0000 mg | ORAL_TABLET | Freq: Three times a day (TID) | ORAL | Status: DC
  Administered 2019-01-03 – 2019-01-04 (×3): 25 mg via ORAL
  Filled 2019-01-03 (×3): qty 1

## 2019-01-03 MED ORDER — CARVEDILOL 12.5 MG PO TABS
12.5000 mg | ORAL_TABLET | Freq: Two times a day (BID) | ORAL | Status: DC
  Administered 2019-01-04: 10:00:00 12.5 mg via ORAL
  Filled 2019-01-03: qty 1

## 2019-01-03 MED ORDER — ISOSORBIDE MONONITRATE ER 30 MG PO TB24
30.0000 mg | ORAL_TABLET | Freq: Every day | ORAL | Status: DC
  Administered 2019-01-04: 10:00:00 30 mg via ORAL
  Filled 2019-01-03: qty 1

## 2019-01-03 NOTE — Telephone Encounter (Signed)
-----   Message from Kate Sable, MD sent at 01/03/2019 12:20 PM EDT ----- Abnormal Lexisc[facility] myocardial perfusion imaging.  Patient needs a left heart cath.  Please schedule with Drs End or Arida depending on availability. Thank you

## 2019-01-03 NOTE — Care Management Important Message (Signed)
Important Message  Patient Details  Name: Roger Pruitt MRN: [id number] Date of Birth: [birth year]   Medicare Important Message Given:  Yes  Initial Medicare IM given by Patient Access Associate on 01/03/2019 at 9:52am.     Dannette Barbara 01/03/2019, 2:59 PM

## 2019-01-03 NOTE — Progress Notes (Signed)
[facility] [place] NAME: Roger Pruitt    MR#:  [id number]  DATE OF BIRTH:  Jul 31, 1950  SUBJECTIVE:  CHIEF COMPLAINT:  Pt is awake and alert today, mentating better than yesterday.  Denies any nausea or vomiting tolerating diet  REVIEW OF SYSTEMS:  CONSTITUTIONAL: No fever, fatigue or weakness.  EYES: No blurred or double vision.  EARS, NOSE, AND THROAT: No tinnitus or ear pain.  RESPIRATORY: No cough, shortness of breath, wheezing or hemoptysis.  CARDIOVASCULAR: No chest pain, orthopnea, edema.  GASTROINTESTINAL: No nausea, vomiting, diarrhea or abdominal pain.  SKIN: No rash or lesion. MUSCULOSKELETAL: No joint pain or arthritis.   NEUROLOGIC: No tingling, numbness, weakness.  PSYCHIATRY: No anxiety or depression.   DRUG ALLERGIES:   Allergies  Allergen Reactions  . Morphine And Related Nausea And Vomiting  . Synalgos-Dc [Aspirin-Caff-Dihydrocodeine] Itching and Other (See Comments)    Reaction:  Stinging   . Synchlor A [Chlorpheniramine] Rash and Other (See Comments)    Patient doesn't recall this allergy.    VITALS:  Blood pressure (!) 175/101, pulse 89, temperature 98.4 F (36.9 C), temperature source Oral, resp. rate 18, height 6\' 3"  (1.905 m), weight 98.7 kg, SpO2 97 %.  PHYSICAL EXAMINATION:  GENERAL:  68 y.o.-year-old patient lying in the bed with no acute distress.  EYES: Pupils equal, round, reactive to light and accommodation. No scleral icterus. Extraocular muscles intact.  HEENT: Head atraumatic, normocephalic. Oropharynx and nasopharynx clear.  NECK:  Supple, no jugular venous distention. No thyroid enlargement, no tenderness.  LUNGS: Normal breath sounds bilaterally, no wheezing, rales,rhonchi or crepitation. No use of accessory muscles of respiration.  CARDIOVASCULAR: S1, S2 normal. No murmurs, rubs, or gallops.  ABDOMEN: Soft, nontender, nondistended. Bowel sounds present.  EXTREMITIES: No pedal edema,  cyanosis, or clubbing.  NEUROLOGIC: Cranial nerves II through XII are intact. . Sensation intact. Gait not checked.  PSYCHIATRIC: The patient is alert and oriented x2- 3.  SKIN: No obvious rash, lesion, or ulcer.    LABORATORY PANEL:   CBC Recent Labs  Lab 01/03/19 0433  WBC 9.9  HGB 12.6*  HCT 39.5  PLT 182   ------------------------------------------------------------------------------------------------------------------  Chemistries  Recent Labs  Lab 01/02/19 1033 01/03/19 0433  NA 140 140  K 4.0 3.8  CL 107 112*  CO2 26 22  GLUCOSE 111* 125*  BUN 20 23  CREATININE 1.07 0.96  CALCIUM 9.1 8.6*  AST 15  --   ALT 16  --   ALKPHOS 64  --   BILITOT 0.6  --    ------------------------------------------------------------------------------------------------------------------  Cardiac Enzymes No results for input(s): TROPONINI in the last 168 hours. ------------------------------------------------------------------------------------------------------------------  RADIOLOGY:  Ct Head Wo Contrast  Result Date: 01/02/2019 CLINICAL DATA:  Altered level of consciousness. EXAM: CT HEAD WITHOUT CONTRAST TECHNIQUE: Contiguous axial images were obtained from the base of the skull through the vertex without intravenous contrast. COMPARISON:  12/10/2018 FINDINGS: Brain: There is no evidence for acute hemorrhage, hydrocephalus, mass lesion, or abnormal extra-axial fluid collection. No definite CT evidence for acute infarction. Diffuse loss of parenchymal volume is consistent with atrophy. Patchy low attenuation in the deep hemispheric and periventricular white matter is nonspecific, but likely reflects chronic microvascular ischemic demyelination. Small lacunar infarcts are seen in the basal ganglia bilaterally. Vascular: No hyperdense vessel or unexpected calcification. Skull: No evidence for fracture. No worrisome lytic or sclerotic lesion. Sinuses/Orbits: The visualized paranasal  sinuses and mastoid air cells are clear. Visualized portions  of the globes and intraorbital fat are unremarkable. Other: None. IMPRESSION: Stable.  No acute intracranial abnormality. Atrophy with small vessel white matter disease in bilateral lacunar infarcts in the basal ganglia. Electronically Signed   By: Misty Stanley M.D.   On: 01/02/2019 13:50    EKG:   Orders placed or performed during the hospital encounter of 01/02/19  . ED EKG  . ED EKG  . EKG 12-Lead  . EKG 12-Lead    ASSESSMENT AND PLAN:    #Acute encephalopathy from urinary tract infection Clinically improving Continue close monitoring Follow-up on the cultures  #Acute cystitis continue IV Rocephin and given IV fluids Urine cultures Lactic acid is 1.1 procalcitonin less than 0.10  #Acute urinary retention with history of massive prostate, benign prostatic hypertrophy Continue Flomax and Foley catheter Outpatient follow-up with urology after discharge  #Essential hypertension-blood pressure is elevated We will resume his home medication Coreg, Imdur and hydralazine and titrate as needed Hold off on the lisinopril  #Diabetes mellitus-sliding scale insulin for now Continue home medication Tradjenta and metformin  #Polypharmacy during previous admission Continue to hold his home medications Neurontin and Wellbutrin until patient is completely alert    All the records are reviewed and case discussed with Care Management/Social Workerr. Management plans discussed with the patient, family and they are in agreement.  CODE STATUS:   TOTAL TIME TAKING CARE OF THIS PATIENT: 35  minutes.   POSSIBLE D/C IN  1-2  DAYS, DEPENDING ON CLINICAL CONDITION.  Note: This dictation was prepared with Dragon dictation along with smaller phrase technology. Any transcriptional errors that result from this process are unintentional.   Nicholes Mango M.D on 01/03/2019 at 3:23 PM  Between 7am to 6pm - Pager - [phone] After 6pm  go to [email or web address] - password [email or web address] [facility]  Office  [phone]  CC: Primary care physician; Casilda Carls, MD

## 2019-01-03 NOTE — Telephone Encounter (Signed)
No answer. Left message to call back with patient. Also tried to reach wife (dpr), no answer or VM set up.  Was going to arrange f/u with APP in the next week to discuss cath, ok per Dr Garen Lah.

## 2019-01-04 ENCOUNTER — Ambulatory Visit: Admission: RE | Admit: 2019-01-04 | Payer: Medicare Other | Source: Ambulatory Visit

## 2019-01-04 ENCOUNTER — Inpatient Hospital Stay (HOSPITAL_COMMUNITY)
Admit: 2019-01-04 | Discharge: 2019-01-04 | Disposition: A | Discharge status: Home / Self Care | Payer: Medicare Other | Attending: Internal Medicine | Admitting: Internal Medicine

## 2019-01-04 DIAGNOSIS — I361 Nonrheumatic tricuspid (valve) insufficiency: Secondary | ICD-10-CM

## 2019-01-04 DIAGNOSIS — I340 Nonrheumatic mitral (valve) insufficiency: Secondary | ICD-10-CM

## 2019-01-04 LAB — GLUCOSE, CAPILLARY
Glucose-Capillary: 95 mg/dL (ref 70–99)
Glucose-Capillary: 91 mg/dL (ref 70–99)

## 2019-01-04 LAB — ECHOCARDIOGRAM COMPLETE
Height: 75 in
Weight: 3433.6 oz

## 2019-01-04 LAB — PROCALCITONIN: Procalcitonin: <0.1 ng/mL

## 2019-01-04 MED ORDER — CEPHALEXIN 500 MG PO CAPS: 500.0000 mg | ORAL_CAPSULE | Freq: Three times a day (TID) | ORAL | 0 refills | Status: DC

## 2019-01-04 MED ORDER — CHLORHEXIDINE GLUCONATE CLOTH 2 % EX PADS
6.0000 | MEDICATED_PAD | Freq: Every day | CUTANEOUS | Status: DC
  Administered 2019-01-04: 10:00:00 6 via TOPICAL

## 2019-01-04 MED ORDER — CEPHALEXIN 500 MG PO CAPS
500.0000 mg | ORAL_CAPSULE | Freq: Three times a day (TID) | ORAL | Status: DC
  Administered 2019-01-04: 500 mg via ORAL
  Filled 2019-01-04: qty 1

## 2019-01-04 NOTE — Discharge Instructions (Signed)
Continue Foley catheter until seen by urology Home health PT, RN Ambulate with walker Follow-up with primary care physician in 3 days Follow-up with urology in 10 days

## 2019-01-04 NOTE — Telephone Encounter (Signed)
-----   Message from Kate Sable, MD sent at 01/03/2019 12:20 PM EDT ----- Abnormal Lexisc[facility] myocardial perfusion imaging.  Patient needs a left heart cath.  Please schedule with Drs End or Arida depending on availability. Thank you

## 2019-01-04 NOTE — Discharge Summary (Signed)
Roger Pruitt: Roger Pruitt    MR#:  [id number]  DATE OF BIRTH:  Jan 12, 1951  DATE OF ADMISSION:  01/02/2019 ADMITTING PHYSICIAN: Demetrios Loll, MD  DATE OF DISCHARGE:  01/04/19   PRIMARY CARE PHYSICIAN: Casilda Carls, MD    ADMISSION DIAGNOSIS:  Urinary tract infection without hematuria, site unspecified [N39.0] Altered mental status, unspecified altered mental status type [R41.82]  DISCHARGE DIAGNOSIS:  Active Problems:   UTI (urinary tract infection)   SECONDARY DIAGNOSIS:   Past Medical History:  Diagnosis Date  . Abdominal pain   . Acute cystitis without hematuria   . Anemia   . Anginal pain ([place])    rarely  . Arthritis    knees and hands  . Asthma   . Bipolar disorder ([place])    BIPOLAR/ SCHIZO/ AFFECTIVE   . BPD (bronchopulmonary dysplasia)   . BPH (benign prostatic hyperplasia)   . CAD (coronary artery disease)    a. s/p MI 2001, 2012;  b. s/p prior LCX stenting; b. 05/2012 Abnl MV; c. 05/2012 Cath: patent LCX stent w/ otw mod non-obs dzs->Med Rx.  . Carpal tunnel syndrome    surgery both hands  . Depression   . Disc degeneration, lumbar   . Dyspnea    WITH EXERTION  . Elevated PSA   . Esophageal reflux   . GERD (gastroesophageal reflux disease)   . Hematuria    history of gross and microscopic  . Hemorrhoid   . History of balanitis   . History of bladder infections   . History of echocardiogram    a. 07/2014 Echo: EF 60-65%, no rwma, nl LA size, nl RV/PASP.  Marland Kitchen History of urethral stricture   . Hyperlipidemia   . Hypertension    controlled  . Hypertensive heart disease   . Incomplete bladder emptying   . Iron deficiency anemia, unspecified   . Lower urinary tract symptoms   . Myocardial infarction ([place])    X7 LAST 2001  . Neuropathy   . Nocturia   . Obsessive compulsive disorder   . Other dysphagia   . Panic attack   . Parkinson's disease ([place])   . Sleep apnea    SURGICAL TX  . Stroke  ([place])   . Type II diabetes mellitus ([place])   . Unspecified vitamin D deficiency   . Urethral stricture   . Urinary retention   . Varicose vein of leg   . Vertigo    hx of  . Vitamin D deficiency   . Wears dentures    upper and lowers    HOSPITAL COURSE:  [name]  is a 68 y.o. male with a known history of bipolar disorder, hypertension BPH, diabetes mellitus type 2, CAD status post CABG comes in because of altered mental status, brought by family.  Patient found to have UTI.  Afebrile.  And recently was admitted last month for altered mental status secondary to polypharmacy, urine retention.   #Acute encephalopathy from urinary tract infection Clinically improving Continue close monitoring Follow-up cultures is revealing Streptococcus viridans.  Will discharge patient with p.o. Keflex for 5 days(discussed with ID curbside)  #Acute cystitis continue IV Rocephin and given IV fluids Urine cultures revealed Streptococcus viridans UTI discharge with Keflex for 5 days Lactic acid is 1.1 procalcitonin less than 0.10  #Acute urinary retention with history of massive prostate, benign prostatic hypertrophy Continue Flomax and Foley catheter Outpatient follow-up with urology after discharge  #Essential  hypertension-blood pressure is elevated We will resume his home medication Coreg, Imdur and hydralazine and titrate as needed Hold off on the lisinopril  #Diabetes mellitus-sliding scale insulin for now Continue home medication Tradjenta and metformin  #Polypharmacy during previous admission held his home medications Neurontin and Wellbutrin as patient was lethargic initially but he is back to his baseline mentation will resume the same   Patient ambulated in the hallway with the nurse with walker and feels comfortable to go home.  Patient has walker at home Discharge with home health PT RN and continue Foley catheter until seen and evaluated by urology  DISCHARGE CONDITIONS:   Stable   CONSULTS OBTAINED:     PROCEDURES  None   DRUG ALLERGIES:   Allergies  Allergen Reactions  . Morphine And Related Nausea And Vomiting  . Synalgos-Dc [Aspirin-Caff-Dihydrocodeine] Itching and Other (See Comments)    Reaction:  Stinging   . Synchlor A [Chlorpheniramine] Rash and Other (See Comments)    Patient doesn't recall this allergy.    DISCHARGE MEDICATIONS:   Allergies as of 01/04/2019      Reactions   Morphine And Related Nausea And Vomiting   Synalgos-dc [aspirin-caff-dihydrocodeine] Itching, Other (See Comments)   Reaction:  Stinging    Synchlor A [chlorpheniramine] Rash, Other (See Comments)   Patient doesn't recall this allergy.      Medication List    STOP taking these medications   diazepam 10 MG tablet Commonly known as: Valium   traMADol 50 MG tablet Commonly known as: ULTRAM     TAKE these medications   acetaminophen 500 MG tablet Commonly known as: TYLENOL Take 1,000 mg by mouth every 6 (six) hours as needed for moderate pain or headache.   Aspirin 81 81 MG EC tablet Generic drug: aspirin Take 81 mg by mouth daily.   atorvastatin 40 MG tablet Commonly known as: LIPITOR Take 1 tablet (40 mg total) by mouth daily at 6 PM.   buPROPion 300 MG 24 hr tablet Commonly known as: WELLBUTRIN XL Take 300 mg by mouth daily. noon   carvedilol 12.5 MG tablet Commonly known as: COREG Take 12.5 mg by mouth 2 (two) times daily.   cephALEXin 500 MG capsule Commonly known as: KEFLEX Take 1 capsule (500 mg total) by mouth every 8 (eight) hours.   Combivent Respimat 20-100 MCG/ACT Aers respimat Generic drug: Ipratropium-Albuterol INHALE ONE PUFF INTO THE LUNGS EVERY 6 HOURS What changed:   how much to take  how to take this  when to take this  additional instructions   doxepin 25 MG capsule Commonly known as: SINEQUAN Take 50 mg by mouth at bedtime.   fluticasone 50 MCG/ACT nasal spray Commonly known as: FLONASE Place 2 sprays  into the nose daily.   gabapentin 300 MG capsule Commonly known as: NEURONTIN Take 300 mg by mouth 3 (three) times daily.   hydrALAZINE 25 MG tablet Commonly known as: APRESOLINE Take 1 tablet (25 mg total) by mouth every 8 (eight) hours.   isosorbide mononitrate 30 MG 24 hr tablet Commonly known as: IMDUR Take 30 mg by mouth daily.   Janumet 50-1000 MG tablet Generic drug: sitaGLIPtin-metformin Take 1 tablet by mouth 2 (two) times daily. Am and pm   lisinopril 20 MG tablet Commonly known as: ZESTRIL Take 20 mg by mouth 2 (two) times daily.   LORazepam 1 MG tablet Commonly known as: ATIVAN Take 1-2 tablets (1-2 mg total) by mouth See admin instructions. Take 1 mg by mouth in  the morning and take 2 mg by mouth at bedtime   nitroGLYCERIN 0.4 MG/SPRAY spray Commonly known as: NITROLINGUAL Place 1 spray under the tongue every 5 (five) minutes x 3 doses as needed for chest pain.   pantoprazole 40 MG tablet Commonly known as: PROTONIX Take 40 mg by mouth daily.   risperiDONE 1 MG tablet Commonly known as: RISPERDAL Take 1 tablet (1 mg total) by mouth at bedtime.   Symbicort 160-4.5 MCG/ACT inhaler Generic drug: budesonide-formoterol INHALE TWO PUFFS INTO THE LUNGS TWICE DAILY   tamsulosin 0.4 MG Caps capsule Commonly known as: FLOMAX Take 1 capsule (0.4 mg total) by mouth daily.   topiramate 100 MG tablet Commonly known as: TOPAMAX Take 100 mg by mouth daily at 12 noon.        DISCHARGE INSTRUCTIONS:  Continue Foley catheter until seen by urology Home health PT, RN Ambulate with walker Follow-up with primary care physician in 3 days Follow-up with urology in 10 days   DIET:  Cardiac diet and Diabetic diet  DISCHARGE CONDITION:  Fair  ACTIVITY:  Activity as tolerated  OXYGEN:  Home Oxygen: No.   Oxygen Delivery: room air  DISCHARGE LOCATION:  home   If you experience worsening of your admission symptoms, develop shortness of breath, life  threatening emergency, suicidal or homicidal thoughts you must seek medical attention immediately by calling 911 or calling your MD immediately  if symptoms less severe.  You Must read complete instructions/literature along with all the possible adverse reactions/side effects for all the Medicines you take and that have been prescribed to you. Take any new Medicines after you have completely understood and accpet all the possible adverse reactions/side effects.   Please note  You were cared for by a hospitalist during your hospital stay. If you have any questions about your discharge medications or the care you received while you were in the hospital after you are discharged, you can call the unit and asked to speak with the hospitalist on call if the hospitalist that took care of you is not available. Once you are discharged, your primary care physician will handle any further medical issues. Please note that NO REFILLS for any discharge medications will be authorized once you are discharged, as it is imperative that you return to your primary care physician (or establish a relationship with a primary care physician if you do not have one) for your aftercare needs so that they can reassess your need for medications and monitor your lab values.     Today  Chief Complaint  Patient presents with  . Altered Mental Status   Patient is feeling fine denies any nausea or vomiting.  He has BPH and agreeable to go home with Foley catheter because of the urinary retention We will discharge patient home.  Ambulated with the help of RN with walker without any difficulty.  Patient has walker at home  ROS:  CONSTITUTIONAL: Denies fevers, chills. Denies any fatigue, weakness.  EYES: Denies blurry vision, double vision, eye pain. EARS, NOSE, THROAT: Denies tinnitus, ear pain, hearing loss. RESPIRATORY: Denies cough, wheeze, shortness of breath.  CARDIOVASCULAR: Denies chest pain, palpitations, edema.   GASTROINTESTINAL: Denies nausea, vomiting, diarrhea, abdominal pain. Denies bright red blood per rectum. GENITOURINARY: Denies dysuria, hematuria. ENDOCRINE: Denies nocturia or thyroid problems. HEMATOLOGIC AND LYMPHATIC: Denies easy bruising or bleeding. SKIN: Denies rash or lesion. MUSCULOSKELETAL: Denies pain in neck, back, shoulder, knees, hips or arthritic symptoms.  NEUROLOGIC: Denies paralysis, paresthesias.  PSYCHIATRIC: Denies anxiety  or depressive symptoms.   VITAL SIGNS:  Blood pressure 128/80, pulse 63, temperature 98.1 F (36.7 C), temperature source Oral, resp. rate 20, height 6\' 3"  (1.905 m), weight 97.3 kg, SpO2 98 %.  I/O:    Intake/Output Summary (Last 24 hours) at 01/04/2019 1313 Last data filed at 01/04/2019 1024 Gross per 24 hour  Intake 580 ml  Output 3900 ml  Net -3320 ml    PHYSICAL EXAMINATION:  GENERAL:  68 y.o.-year-old patient lying in the bed with no acute distress.  EYES: Pupils equal, round, reactive to light and accommodation. No scleral icterus. Extraocular muscles intact.  HEENT: Head atraumatic, normocephalic. Oropharynx and nasopharynx clear.  NECK:  Supple, no jugular venous distention. No thyroid enlargement, no tenderness.  LUNGS: Normal breath sounds bilaterally, no wheezing, rales,rhonchi or crepitation. No use of accessory muscles of respiration.  CARDIOVASCULAR: S1, S2 normal. No murmurs, rubs, or gallops.  ABDOMEN: Soft, non-tender, non-distended. Bowel sounds present.  EXTREMITIES: No pedal edema, cyanosis, or clubbing.  NEUROLOGIC: Cranial nerves II through XII are intact. Muscle strength at his baseline in all extremities. Sensation intact. Gait not checked.  PSYCHIATRIC: The patient is alert and oriented x 3.  SKIN: No obvious rash, lesion, or ulcer.   DATA REVIEW:   CBC Recent Labs  Lab 01/03/19 0433  WBC 9.9  HGB 12.6*  HCT 39.5  PLT 182    Chemistries  Recent Labs  Lab 01/02/19 1033 01/03/19 0433  NA 140 140  K  4.0 3.8  CL 107 112*  CO2 26 22  GLUCOSE 111* 125*  BUN 20 23  CREATININE 1.07 0.96  CALCIUM 9.1 8.6*  AST 15  --   ALT 16  --   ALKPHOS 64  --   BILITOT 0.6  --     Cardiac Enzymes No results for input(s): TROPONINI in the last 168 hours.  Microbiology Results  Results for orders placed or performed during the hospital encounter of 01/02/19  Urine Culture     Status: Abnormal   Collection Time: 01/02/19 12:24 PM   Specimen: Urine, Random  Result Value Ref Range Status   Specimen Description   Final    URINE, RANDOM Performed at [facility], [address], [place], [place] [address]    Special Requests   Final    NONE Performed at [facility], [address] [place], [place] [address]    Culture >=100,000 COLONIES/mL VIRIDANS STREPTOCOCCUS (A)  Final   Report Status 01/03/2019 FINAL  Final  SARS Coronavirus 2 [facility] order, Performed in [place] hospital lab)     Status: None   Collection Time: 01/02/19  3:07 PM  Result Value Ref Range Status   SARS Coronavirus 2 NEGATIVE NEGATIVE Final    Comment: (NOTE) If result is NEGATIVE SARS-CoV-2 target nucleic acids are NOT DETECTED. The SARS-CoV-2 RNA is generally detectable in upper and lower  respiratory specimens during the acute phase of infection. The lowest  concentration of SARS-CoV-2 viral copies this assay can detect is 250  copies / mL. A negative result does not preclude SARS-CoV-2 infection  and should not be used as the sole basis for treatment or other  patient management decisions.  A negative result may occur with  improper specimen collection / handling, submission of specimen other  than nasopharyngeal swab, presence of viral mutation(s) within the  areas targeted by this assay, and inadequate number of viral copies  (<250 copies / mL). A negative result must be combined  with clinical  observations, patient history, and epidemiological information. If result is  POSITIVE SARS-CoV-2 target nucleic acids are DETECTED. The SARS-CoV-2 RNA is generally detectable in upper and lower  respiratory specimens dur ing the acute phase of infection.  Positive  results are indicative of active infection with SARS-CoV-2.  Clinical  correlation with patient history and other diagnostic information is  necessary to determine patient infection status.  Positive results do  not rule out bacterial infection or co-infection with other viruses. If result is PRESUMPTIVE POSTIVE SARS-CoV-2 nucleic acids MAY BE PRESENT.   A presumptive positive result was obtained on the submitted specimen  and confirmed on repeat testing.  While 2019 novel coronavirus  (SARS-CoV-2) nucleic acids may be present in the submitted sample  additional confirmatory testing may be necessary for epidemiological  and / or clinical management purposes  to differentiate between  SARS-CoV-2 and other Sarbecovirus currently known to infect humans.  If clinically indicated additional testing with an alternate test  methodology [phone]) is advised. The SARS-CoV-2 RNA is generally  detectable in upper and lower respiratory sp ecimens during the acute  phase of infection. The expected result is Negative. Fact Sheet for Patients:  StrictlyIdeas.no Fact Sheet for Healthcare Providers: BankingDealers.co.za This test is not yet approved or cleared by the [place] FDA and has been authorized for detection and/or diagnosis of SARS-CoV-2 by FDA under an Emergency Use Authorization (EUA).  This EUA will remain in effect (meaning this test can be used) for the duration of the COVID-19 declaration under Section 564(b)(1) of the Act, 21 U.S.C. section 360bbb-3(b)(1), unless the authorization is terminated or revoked sooner. Performed at [facility], [address] [place], [place] [address]   Urine Culture     Status: Abnormal   Collection  Time: 01/02/19  6:10 PM   Specimen: Urine, Random  Result Value Ref Range Status   Specimen Description   Final    URINE, RANDOM Performed at [facility], [address] [place], [place] [address]    Special Requests   Final    NONE Performed at [facility], [address] [place], [place] [address]    Culture >=100,000 COLONIES/mL VIRIDANS STREPTOCOCCUS (A)  Final   Report Status 01/03/2019 FINAL  Final    RADIOLOGY:  Ct Head Wo Contrast  Result Date: 01/02/2019 CLINICAL DATA:  Altered level of consciousness. EXAM: CT HEAD WITHOUT CONTRAST TECHNIQUE: Contiguous axial images were obtained from the base of the skull through the vertex without intravenous contrast. COMPARISON:  12/10/2018 FINDINGS: Brain: There is no evidence for acute hemorrhage, hydrocephalus, mass lesion, or abnormal extra-axial fluid collection. No definite CT evidence for acute infarction. Diffuse loss of parenchymal volume is consistent with atrophy. Patchy low attenuation in the deep hemispheric and periventricular white matter is nonspecific, but likely reflects chronic microvascular ischemic demyelination. Small lacunar infarcts are seen in the basal ganglia bilaterally. Vascular: No hyperdense vessel or unexpected calcification. Skull: No evidence for fracture. No worrisome lytic or sclerotic lesion. Sinuses/Orbits: The visualized paranasal sinuses and mastoid air cells are clear. Visualized portions of the globes and intraorbital fat are unremarkable. Other: None. IMPRESSION: Stable.  No acute intracranial abnormality. Atrophy with small vessel white matter disease in bilateral lacunar infarcts in the basal ganglia. Electronically Signed   By: Misty Stanley M.D.   On: 01/02/2019 13:50    EKG:   Orders placed or performed during the hospital encounter of 01/02/19  . ED EKG  . ED  EKG  . EKG 12-Lead  . EKG 12-Lead      Management plans discussed with the patient, he is in  agreement.  CODE STATUS:     Code Status Orders  (From admission, onward)         Start     Ordered   01/02/19 1451  Full code  Continuous     01/02/19 1452        Code Status History    Date Active Date Inactive Code Status Order ID Comments User Context   12/10/2018 2154 12/12/2018 1955 DNR [id number]  Lang Snow, NP ED   11/30/2018 0433 12/01/2018 1814 DNR [id number]  Harrie Foreman, MD Inpatient   09/05/2018 1057 09/05/2018 1531 DNR [id number]  Hillary Bow, MD ED   09/05/2018 0647 09/05/2018 1057 Full Code [id number]  Mansy, Arvella Merles, MD ED   10/18/2016 2306 10/20/2016 1735 Full Code [id number]  Hugelmeyer, Alexis, DO ED   12/31/2015 1548 01/04/2016 1801 Full Code [id number]  Theodoro Grist, MD Inpatient   12/25/2015 1332 12/30/2015 1640 Full Code [id number]  John Giovanni, PA-C Inpatient   07/28/2015 2329 07/30/2015 2121 Full Code [id number]  Harrie Foreman, MD Inpatient   07/21/2015 0029 07/23/2015 1733 Full Code [id number]  Lance Coon, MD Inpatient   08/23/2014 1652 08/24/2014 1609 Full Code [id number]  Demetrios Loll, MD Inpatient   01/01/2013 0419 01/01/2013 2029 Full Code [id number]  Toy Baker, MD Inpatient   Advance Care Planning Activity    Advance Directive Documentation     Most Recent Value  Type of Advance Directive  Healthcare Power of Attorney, Living will  Pre-existing out of facility DNR order (yellow form or pink MOST form)  -  "MOST" Form in Place?  -      TOTAL TIME TAKING CARE OF THIS PATIENT: 45  minutes.   Note: This dictation was prepared with Dragon dictation along with smaller phrase technology. Any transcriptional errors that result from this process are unintentional.   @[facility] @  on 01/04/2019 at 1:13 PM  Between 7am to 6pm - Pager - [phone]  After 6pm go to [email or web address] - password [email or web address] [facility]  Office  [phone]  CC: Primary care physician; Casilda Carls, MD

## 2019-01-04 NOTE — TOC Transition Note (Signed)
Transition of Care [facility]) - CM/SW Discharge Note   Patient Details  Name: Roger Pruitt MRN: [id number] Date of Birth: 07-28-1950  [facility]) CM/SW Contact:  Beverly Sessions, RN Phone Number: 01/04/2019, 4:20 PM   Clinical Narrative:     Patient admitted from home with UTI Patient lives at home alone.  Wife recently passed  Patient relies on his neighbor for transportation to appointments, and to run errands.   PCP Jadali.  Patient has follow up appointment scheduled for 10/13  Patient to discharge home with resumption of home health orders.  SW added.  Patient to discharge with foley.  Corene Cornea with [place] notified of discharge   Patient is paying for his own taxi home at discharge   Final next level of care: [place]     Patient Goals and CMS Choice        Discharge Placement                       Discharge Plan and Services                          HH Arranged: RN, PT, Social Work [facility] Agency: [place] ([place]) Date [place]: 01/04/19 Time [place]: 1620 Representative spoke with at [place]: [place] ([place]) Interventions     Readmission Risk Interventions Readmission Risk Prevention Plan 01/04/2019 01/04/2019 12/11/2018  Transportation Screening Complete Complete Complete  HRI or [place] - - Complete  Social Work Consult for [place] Planning/Counseling - - Complete  Palliative Care Screening - - Not Applicable  Medication Review Press photographer) Complete Complete Complete  PCP or Specialist appointment within 3-5 days of discharge Complete Complete -  [place] or Home Care Consult Complete Complete -  Palliative Care Screening Not Applicable Not Applicable -  [place] Not Applicable Patient Refused -  Some recent data might be hidden

## 2019-01-04 NOTE — Progress Notes (Signed)
*  PRELIMINARY RESULTS* Echocardiogram 2D Echocardiogram has been performed.  Sherrie Sport 01/04/2019, 11:59 AM

## 2019-01-04 NOTE — Telephone Encounter (Signed)
Patient made aware of stress test results and Dr. Thereasa Solo recommendation. Patient f/u moved up. Appt scheduled with Ignacia Bayley, NP on 01/08/19 @ 10:30am to discuss LHC. Patient is aware of the appt date and time. He will need 1 support person with him for the visit.

## 2019-01-06 NOTE — Progress Notes (Signed)
01/07/2019 9:08 PM   Joy [name] 05-Sep-1950 [id number]  Referring provider: Casilda Carls, [address] [place],  [place] [address]  Chief Complaint  Patient presents with  . Hospitalization Follow-up    HPI: Mr. Nyquist is a 68 year old male with a history of hematuria, BPH with LU TS, rUTI's a prostate nodule who presents today for follow up from admission due to UTI.  Patient is scheduled for a left heart catheterization due to an abnormal stress test on 01/15/2019.    History of hematuria (high risk) Former smoker.  Hematuria work up x 3, 2011, 2015 & 2017 - findings positive for bilateral nephrolithiasis, bilateral renal cysts and enlarged friable prostate - s/p KTP in 2016.  Contrast CT on 12/10/2018 for abdominal pain revealed marked urinary bladder distention, possible outlet obstruction.  Post TURP changes of the prostate  Nonobstructing kidney stonesNo report of gross hematuria. Recent UA's with microscopic hematuria associated with infection.  Most recent UA on 01/07/2019 negative for microscopic hematuria.    BPH WITH LUTS  (prostate and/or bladder) Patient was discharged with a Foley catheter, but he removed the Foley himself on Sunday.  He did that because he stepped on the overnight bag and broke the plastic and it started to leak all over.    His PVR today is 781 mL.  Previous score: 20/5   Previous PVR: 322 mL   Major complaint(s):  Frequency, nocturia and weak urinary stream. Patient denies any gross hematuria, dysuria or suprapubic/flank pain.  Patient denies any fevers, chills, nausea or vomiting.   Currently taking: Flomax.  His has had KTP in 2016.    He has not been cathing as he states he has never received catheters from the catheter company.   He does not have a family history of PCa.  rUTI's Risk factors: age, BPH, urinary retention and self cathing Recently hospitalized for UTI - urine cultures positive for Viridans streptococcus    Prostate nodule Prostate MRI pending for 1 cm slightly firm nodule in the right lobe  Component     Latest Ref Rng & Units 11/21/2014 10/26/2015 10/18/2016 10/26/2016  Prostate Specific Ag, Serum     0.0 - 4.0 ng/mL 0.4 0.6 3.3 3.0   Component     Latest Ref Rng & Units 05/08/2017  Prostate Specific Ag, Serum     0.0 - 4.0 ng/mL 1.4   Component     Latest Ref Rng & Units 12/24/2018  Prostatic Specific Antigen     0.00 - 4.00 ng/mL 1.37    PMH: Past Medical History:  Diagnosis Date  . Abdominal pain   . Acute cystitis without hematuria   . Anemia   . Anginal pain ([place])    rarely  . Arthritis    knees and hands  . Asthma   . Bipolar disorder ([place])    BIPOLAR/ SCHIZO/ AFFECTIVE   . BPD (bronchopulmonary dysplasia)   . BPH (benign prostatic hyperplasia)   . CAD (coronary artery disease)    a. s/p MI 2001, 2012;  b. s/p prior LCX stenting; b. 05/2012 Abnl MV; c. 11/2015 Cath: 3VD-->12/2015 CABG x 4 (LIMA->LAD, VG->D1, VG->OM2, VG->RPDA); d. 12/2018 MV: mid-dist inflat ischemia. Basal inf lat infarct. EF 55%.  . Carpal tunnel syndrome    surgery both hands  . Depression   . Diastolic dysfunction    a. 07/2014 Echo: EF 60-65%, no rwma, nl LA size, nl RV/PASP; b. 12/2018 Echo: EF 50-55%, mod LVH.  Inf HK. Impaired relaxation. Mildly red RV fxn. Mildly dil LA. Ao root 39m.  . Disc degeneration, lumbar   . Dyspnea    WITH EXERTION  . Elevated PSA   . GERD (gastroesophageal reflux disease)   . Hematuria    history of gross and microscopic  . Hemorrhoid   . History of balanitis   . History of bladder infections   . History of urethral stricture   . Hyperlipidemia   . Hypertensive heart disease   . Incomplete bladder emptying   . Iron deficiency anemia, unspecified   . Lower urinary tract symptoms   . Myocardial infarction ([place]    X7 LAST 2001  . Neuropathy   . Nocturia   . Obsessive compulsive disorder   . Other dysphagia   . Panic attack   . Parkinson's disease ([place]   .  Sleep apnea    SURGICAL TX  . Stroke ([place]   . Type II diabetes mellitus ([place]   . Unspecified vitamin D deficiency   . Urethral stricture   . Urinary retention   . Varicose vein of leg   . Vertigo    hx of  . Vitamin D deficiency   . Wears dentures    upper and lowers    Surgical History: Past Surgical History:  Procedure Laterality Date  . BACK SURGERY     lumbar disc  . BACK SURGERY  1984  . CARDIAC CATHETERIZATION  6/10;2011;Aug.2012;Oct. 2012;   . CARDIAC CATHETERIZATION  05/2012   [facility]; EF 60%, patent stent in the left circumflex, moderate three-vessel disease with no flow-limiting lesions. Unchanged from most recent catheterization  . CARDIAC CATHETERIZATION N/A 12/10/2015   Procedure: Left Heart Cath and Coronary Angiography;  Surgeon: BCorey Skains MD;  [facility];  Service: Cardiovascular;  Laterality: N/A;  . CARPAL TUNNEL RELEASE  2009   left  . CATARACT EXTRACTION W/PHACO Left 02/14/2017   Procedure: CATARACT EXTRACTION PHACO AND INTRAOCULAR LENS PLACEMENT (IOC);  Surgeon: PBirder Robson MD;  Location: [facility];  Service: Ophthalmology;  Laterality: Left;  [place]:42 AP% 16.6 CDE 7.14 Fluid pack lot # [id number]  . CATARACT EXTRACTION W/PHACO Right 02/28/2017   Procedure: CATARACT EXTRACTION PHACO AND INTRAOCULAR LENS PLACEMENT (IOC);  Surgeon: PBirder Robson MD;  Location: [facility];  Service: Ophthalmology;  Laterality: Right;  [place]01:09 AP% 11.4 CDE 7.98 Fluid pack lot # [id number]  . COLONOSCOPY WITH PROPOFOL N/A 06/29/2017   Procedure: COLONOSCOPY WITH PROPOFOL;  Surgeon: WLucilla Lame MD;  [facility]  Service: Endoscopy;  Laterality: N/A;  Diabetic - oral meds  . CORONARY ANGIOPLASTY  2011 & 2001   s/p stent  . CORONARY ARTERY BYPASS GRAFT     stent placement  . CORONARY ARTERY BYPASS GRAFT N/A 12/25/2015   Procedure: CORONARY ARTERY BYPASS GRAFTING (CABG) x4  , using left internal mammary artery and right leg  greater saphenous vein harvested endoscopically LIMA-LAD SVG-OM2 SVG-DIAG SVG-PD;  Surgeon: SMelrose Nakayama MD;  [facility]  Service: Open Heart Surgery;  Laterality: N/A;  . [place] . CYST REMOVAL LEG Right   . ESOPHAGOGASTRODUODENOSCOPY  06/29/2017   Procedure: ESOPHAGOGASTRODUODENOSCOPY (EGD);  Surgeon: WLucilla Lame MD;  [place] [facility]  Service: Endoscopy;;  . POLYPECTOMY  06/29/2017   Procedure: POLYPECTOMY INTESTINAL;  Surgeon: WLucilla Lame MD;  [facility]  Service: Endoscopy;;  . SKIN GRAFT    . TEE WITHOUT CARDIOVERSION N/A 12/25/2015  Procedure: TRANSESOPHAGEAL ECHOCARDIOGRAM (TEE);  Surgeon: Melrose Nakayama, MD;  Location: [place];  Service: Open Heart Surgery;  Laterality: N/A;  . TONSILLECTOMY    . UVULOPALATOPHARYNGOPLASTY      Home Medications:  Allergies as of 01/07/2019      Reactions   Morphine And Related Nausea And Vomiting   Synalgos-dc [aspirin-caff-dihydrocodeine] Itching, Other (See Comments)   Reaction:  Stinging    Synchlor A [chlorpheniramine] Rash, Other (See Comments)   Patient doesn't recall this allergy.      Medication List       Accurate as of January 07, 2019 11:59 PM. If you have any questions, ask your nurse or doctor.        acetaminophen 500 MG tablet Commonly known as: TYLENOL Take 1,000 mg by mouth every 6 (six) hours as needed for moderate pain or headache.   Aspirin 81 81 MG EC tablet Generic drug: aspirin Take 81 mg by mouth daily.   atorvastatin 40 MG tablet Commonly known as: LIPITOR Take 1 tablet (40 mg total) by mouth daily at 6 PM.   buPROPion 300 MG 24 hr tablet Commonly known as: WELLBUTRIN XL Take 300 mg by mouth daily. noon   carvedilol 12.5 MG tablet Commonly known as: COREG Take 12.5 mg by mouth 2 (two) times daily.   cephALEXin 500 MG capsule Commonly known as: KEFLEX Take 1 capsule (500 mg total) by mouth every 8 (eight) hours.   Combivent  Respimat 20-100 MCG/ACT Aers respimat Generic drug: Ipratropium-Albuterol INHALE ONE PUFF INTO THE LUNGS EVERY 6 HOURS What changed:   how much to take  how to take this  when to take this  additional instructions   doxepin 25 MG capsule Commonly known as: SINEQUAN Take 50 mg by mouth at bedtime.   fluticasone 50 MCG/ACT nasal spray Commonly known as: FLONASE Place 2 sprays into the nose daily.   gabapentin 300 MG capsule Commonly known as: NEURONTIN Take 300 mg by mouth 3 (three) times daily.   hydrALAZINE 25 MG tablet Commonly known as: APRESOLINE Take 1 tablet (25 mg total) by mouth every 8 (eight) hours.   isosorbide mononitrate 30 MG 24 hr tablet Commonly known as: IMDUR Take 30 mg by mouth daily.   Janumet 50-1000 MG tablet Generic drug: sitaGLIPtin-metformin Take 1 tablet by mouth 2 (two) times daily. Am and pm   lisinopril 20 MG tablet Commonly known as: ZESTRIL Take 20 mg by mouth 2 (two) times daily.   LORazepam 1 MG tablet Commonly known as: ATIVAN Take 1-2 tablets (1-2 mg total) by mouth See admin instructions. Take 1 mg by mouth in the morning and take 2 mg by mouth at bedtime   nitroGLYCERIN 0.4 MG/SPRAY spray Commonly known as: NITROLINGUAL Place 1 spray under the tongue every 5 (five) minutes x 3 doses as needed for chest pain.   pantoprazole 40 MG tablet Commonly known as: PROTONIX Take 40 mg by mouth daily.   risperiDONE 1 MG tablet Commonly known as: RISPERDAL Take 1 tablet (1 mg total) by mouth at bedtime.   Symbicort 160-4.5 MCG/ACT inhaler Generic drug: budesonide-formoterol INHALE TWO PUFFS INTO THE LUNGS TWICE DAILY   tamsulosin 0.4 MG Caps capsule Commonly known as: FLOMAX Take 1 capsule (0.4 mg total) by mouth daily.   topiramate 100 MG tablet Commonly known as: TOPAMAX Take 100 mg by mouth daily at 12 noon.       Allergies:  Allergies  Allergen Reactions  . Morphine And Related Nausea  And Vomiting  . Synalgos-Dc  [Aspirin-Caff-Dihydrocodeine] Itching and Other (See Comments)    Reaction:  Stinging   . Synchlor A [Chlorpheniramine] Rash and Other (See Comments)    Patient doesn't recall this allergy.    Family History: Family History  Problem Relation Age of Onset  . Heart disease Mother   . Heart disease Brother   . Prostate cancer Father   . Heart disease Other   . Heart disease Brother   . Kidney disease Neg Hx   . Kidney cancer Neg Hx   . Bladder Cancer Neg Hx     Social History:  reports that he quit smoking about 19 years ago. His smoking use included cigarettes. He has a 7.50 pack-year smoking history. He has never used smokeless tobacco. He reports that he does not drink alcohol or use drugs.  ROS: UROLOGY Frequent Urination?: Yes Hard to postpone urination?: No Burning/pain with urination?: No Get up at night to urinate?: Yes Leakage of urine?: No Urine stream starts and stops?: No Trouble starting stream?: No Do you have to strain to urinate?: No Blood in urine?: No Urinary tract infection?: Yes Sexually transmitted disease?: No Injury to kidneys or bladder?: No Painful intercourse?: No Weak stream?: Yes Erection problems?: Yes Penile pain?: No  Gastrointestinal Nausea?: No Vomiting?: No Indigestion/heartburn?: No Diarrhea?: No Constipation?: No  Constitutional Fever: No Night sweats?: No Weight loss?: No Fatigue?: No  Skin Skin rash/lesions?: No Itching?: No  Eyes Blurred vision?: No Double vision?: No  Ears/Nose/Throat Sore throat?: No Sinus problems?: No  Hematologic/Lymphatic Swollen glands?: No Easy bruising?: No  Cardiovascular Leg swelling?: No Chest pain?: No  Respiratory Cough?: No Shortness of breath?: Yes  Endocrine Excessive thirst?: No  Musculoskeletal Back pain?: Yes Joint pain?: No  Neurological Headaches?: No Dizziness?: Yes  Psychologic Depression?: Yes Anxiety?: No  Physical Exam: BP (!) 167/80   Pulse 73    Ht 6' 3" (1.905 m)   Wt 217 lb (98.4 kg)   BMI 27.12 kg/m   Constitutional:  Well nourished. Alert and oriented, No acute distress. HEENT: [place] AT, moist mucus membranes.  Trachea midline, no masses. Cardiovascular: No clubbing, cyanosis, or edema. Respiratory: Normal respiratory effort, no increased work of breathing. Neurologic: Grossly intact, no focal deficits, moving all 4 extremities. Psychiatric: Normal mood and affect.  Laboratory Data: Urinalysis  Component     Latest Ref Rng & Units 01/07/2019  Specific Gravity, UA     1.005 - 1.030 1.010  pH, UA     5.0 - 7.5 6.5  Color, UA     Yellow Yellow  Appearance Ur     Clear Hazy (A)  Leukocytes,UA     Negative Trace (A)  Protein,UA     Negative/Trace Negative  Glucose, UA     Negative Negative  Ketones, UA     Negative Negative  RBC, UA     Negative Negative  Bilirubin, UA     Negative Negative  Urobilinogen, Ur     0.2 - 1.0 mg/dL 0.2  Nitrite, UA     Negative Negative  Microscopic Examination      See below:   Component     Latest Ref Rng & Units 01/07/2019          WBC, UA     0 - 5 /hpf 11-30 (A)  RBC     4.14 - 5.80 x10E6/uL 0-2  Epithelial Cells (non renal)     0 - 10 /hpf 0-10  Renal Epithel, UA     None seen /hpf 0-10 (A)  Bacteria, UA     None seen/Few None seen   Lab Results  Component Value Date   WBC 8.4 01/08/2019   HGB 11.8 (L) 01/08/2019   HCT 36.3 (L) 01/08/2019   MCV 88 01/08/2019   PLT 192 01/08/2019    Lab Results  Component Value Date   CREATININE 0.92 01/08/2019    No results found for: PSA  No results found for: TESTOSTERONE  Lab Results  Component Value Date   HGBA1C 5.7 (H) 12/11/2018    Lab Results  Component Value Date   TSH 1.191 11/30/2018       Component Value Date/Time   CHOL 91 09/05/2018 0438   CHOL 124 01/31/2014 0448   HDL 27 (L) 09/05/2018 0438   HDL 25 (L) 01/31/2014 0448   CHOLHDL 3.4 09/05/2018 0438   VLDL 12 09/05/2018 0438   VLDL 23  01/31/2014 0448   LDLCALC 52 09/05/2018 0438   LDLCALC 76 01/31/2014 0448    Lab Results  Component Value Date   AST 15 01/02/2019   Lab Results  Component Value Date   ALT 16 01/02/2019   No components found for: ALKALINEPHOPHATASE No components found for: BILIRUBINTOTAL  No results found for: ESTRADIOL  I have reviewed the labs.   Pertinent Imaging: Results for ZEFERINO, MOUNTS (MRN [id number]) as of 01/07/2019 13:31  Ref. Range 01/07/2019 13:22  Scan Result Unknown [id number]   Assessment & Plan:    1. rUTI's Criteria for recurrent UTI has been met with 2 or more infections in 6 months or 3 or greater infections in one year  Likely due to infrequent catheterizations and retained urine We will contact the catheter company to get him reinstated with monthly cath deliveries  Cystoscopy pending                                       2. Urinary retention Foley replaced today I have explained to the patient that they will  be scheduled for a cystoscopy in our office to evaluate their bladder.  The cystoscopy consists of passing a tube with a lens up through their urethra and into their urinary bladder.   We will inject the urethra with a lidocaine gel prior to introducing the cystoscope to help with any discomfort during the procedure.   After the procedure, they might experience blood in the urine and discomfort with urination.  This will abate after the first few voids.  I have  encouraged the patient to increase water intake  during this time.  Patient denies any allergies to lidocaine.   RTC for cystoscopy to evaluate for BOO/rUTI's/history of hematuria   3. History of hematuria Hematuria work up completed in 2011, 2015 and 2017 - findings positive for bilateral nephrolithiasis, bilateral renal cysts and enlarged friable prostate No report of gross hematuria  Recent microscopic hematuria associated with infections   4. BPH with LUTS Continue conservative management, avoiding  bladder irritants and timed voiding's Most bothersome symptoms is/are frequency and a weak urinary stream Continue tamsulosin 0.4 mg daily; refills given  5. Prostate nodule Prostate MRI for pending                                   Return for cystoscopy  for retention .  These notes generated with voice recognition software. I apologize for typographical errors.  Zara Council, PA-C  [facility] [address]  [place], [place] [address] [phone]

## 2019-01-07 ENCOUNTER — Ambulatory Visit (INDEPENDENT_AMBULATORY_CARE_PROVIDER_SITE_OTHER): Payer: Medicare Other | Admitting: Urology

## 2019-01-07 ENCOUNTER — Encounter: Payer: Self-pay | Admitting: Urology

## 2019-01-07 ENCOUNTER — Ambulatory Visit: Payer: Medicare Other | Admitting: Urology

## 2019-01-07 ENCOUNTER — Other Ambulatory Visit: Payer: Self-pay

## 2019-01-07 VITALS — BP 167/80 | HR 73 | Ht 75.0 in | Wt 217.0 lb

## 2019-01-07 DIAGNOSIS — N138 Other obstructive and reflux uropathy: Secondary | ICD-10-CM | POA: Diagnosis not present

## 2019-01-07 DIAGNOSIS — Z87448 Personal history of other diseases of urinary system: Secondary | ICD-10-CM

## 2019-01-07 DIAGNOSIS — R339 Retention of urine, unspecified: Secondary | ICD-10-CM

## 2019-01-07 DIAGNOSIS — N402 Nodular prostate without lower urinary tract symptoms: Secondary | ICD-10-CM | POA: Diagnosis not present

## 2019-01-07 DIAGNOSIS — N401 Enlarged prostate with lower urinary tract symptoms: Secondary | ICD-10-CM

## 2019-01-07 DIAGNOSIS — I200 Unstable angina: Secondary | ICD-10-CM | POA: Diagnosis not present

## 2019-01-07 DIAGNOSIS — N390 Urinary tract infection, site not specified: Secondary | ICD-10-CM | POA: Diagnosis not present

## 2019-01-07 LAB — URINALYSIS, COMPLETE
Bilirubin, UA: NEGATIVE
Glucose, UA: NEGATIVE
Ketones, UA: NEGATIVE
Nitrite, UA: NEGATIVE
Protein,UA: NEGATIVE
RBC, UA: NEGATIVE
Specific Gravity, UA: 1.01 (ref 1.005–1.030)
Urobilinogen, Ur: 0.2 mg/dL (ref 0.2–1.0)
pH, UA: 6.5 (ref 5.0–7.5)

## 2019-01-07 LAB — MICROSCOPIC EXAMINATION: Bacteria, UA: NONE SEEN

## 2019-01-07 LAB — BLADDER SCAN AMB NON-IMAGING: Scan Result: 781

## 2019-01-07 NOTE — Progress Notes (Signed)
Cardiology Clinic Note   Patient Name: Roger Pruitt Date of Encounter: 01/08/2019  Primary Care Provider:  Casilda Carls, MD Primary Cardiologist:  Kate Sable, MD  Patient Profile    68 year old male with a history of CAD status post four-vessel bypass in 2017, poorly controlled hypertension, remote tobacco abuse, hyperlipidemia, diabetes, depression, and anxiety, who presents for follow-up related to a 2 to 17-month history of rest and exertional chest pain and recent abnormal stress test.  Past Medical History    Past Medical History:  Diagnosis Date   Abdominal pain    Acute cystitis without hematuria    Anemia    Anginal pain ([place])    rarely   Arthritis    knees and hands   Asthma    Bipolar disorder ([place])    BIPOLAR/ SCHIZO/ AFFECTIVE    BPD (bronchopulmonary dysplasia)    BPH (benign prostatic hyperplasia)    CAD (coronary artery disease)    a. s/p MI 2001, 2012;  b. s/p prior LCX stenting; b. 05/2012 Abnl MV; c. 11/2015 Cath: 3VD-->12/2015 CABG x 4 (LIMA->LAD, VG->D1, VG->OM2, VG->RPDA); d. 12/2018 MV: mid-dist inflat ischemia. Basal inf lat infarct. EF 55%.   Carpal tunnel syndrome    surgery both hands   Depression    Diastolic dysfunction    a. 07/2014 Echo: EF 60-65%, no rwma, nl LA size, nl RV/PASP; b. 12/2018 Echo: EF 50-55%, mod LVH. Inf HK. Impaired relaxation. Mildly red RV fxn. Mildly dil LA. Ao root 37mm.   Disc degeneration, lumbar    Dyspnea    WITH EXERTION   Elevated PSA    Esophageal reflux    GERD (gastroesophageal reflux disease)    Hematuria    history of gross and microscopic   Hemorrhoid    History of balanitis    History of bladder infections    History of urethral stricture    Hyperlipidemia    Hypertension    controlled   Hypertensive heart disease    Incomplete bladder emptying    Iron deficiency anemia, unspecified    Lower urinary tract symptoms    Myocardial infarction ([place])    X7 LAST  2001   Neuropathy    Nocturia    Obsessive compulsive disorder    Other dysphagia    Panic attack    Parkinson's disease ([place])    Sleep apnea    SURGICAL TX   Stroke ([place])    Type II diabetes mellitus ([place])    Unspecified vitamin D deficiency    Urethral stricture    Urinary retention    Varicose vein of leg    Vertigo    hx of   Vitamin D deficiency    Wears dentures    upper and lowers   Past Surgical History:  Procedure Laterality Date   BACK SURGERY     lumbar disc   BACK SURGERY  1984   CARDIAC CATHETERIZATION  6/10;2011;Aug.2012;Oct. 2012;    CARDIAC CATHETERIZATION  05/2012   [facility]; EF 60%, patent stent in the left circumflex, moderate three-vessel disease with no flow-limiting lesions. Unchanged from most recent catheterization   CARDIAC CATHETERIZATION N/A 12/10/2015   Procedure: Left Heart Cath and Coronary Angiography;  Surgeon: Corey Skains, MD;  Location: [facility];  Service: Cardiovascular;  Laterality: N/A;   CARPAL TUNNEL RELEASE  2009   left   CATARACT EXTRACTION W/PHACO Left 02/14/2017   Procedure: CATARACT EXTRACTION PHACO AND INTRAOCULAR LENS PLACEMENT ([place]);  Surgeon: George Ina,  Gwyndolyn Saxon, MD;  Location: [facility];  Service: Ophthalmology;  Laterality: Left;  [place] 00:42 AP% 16.6 CDE 7.14 Fluid pack lot # [id number]   CATARACT EXTRACTION W/PHACO Right 02/28/2017   Procedure: CATARACT EXTRACTION PHACO AND INTRAOCULAR LENS PLACEMENT (IOC);  Surgeon: Birder Robson, MD;  Location: [facility];  Service: Ophthalmology;  Laterality: Right;  [place] 01:09 AP% 11.4 CDE 7.98 Fluid pack lot # [id number]   COLONOSCOPY WITH PROPOFOL N/A 06/29/2017   Procedure: COLONOSCOPY WITH PROPOFOL;  Surgeon: [name], MD;  Location: [facility];  Service: Endoscopy;  Laterality: N/A;  Diabetic - oral meds   CORONARY ANGIOPLASTY  2011 & 2001   s/p stent   CORONARY ARTERY BYPASS GRAFT     stent placement   CORONARY ARTERY BYPASS GRAFT N/A  12/25/2015   Procedure: CORONARY ARTERY BYPASS GRAFTING (CABG) x4  , using left internal mammary artery and right leg greater saphenous vein harvested endoscopically LIMA-LAD SVG-OM2 SVG-DIAG SVG-PD;  Surgeon: Melrose Nakayama, MD;  Location: [facility];  Service: Open Heart Surgery;  Laterality: N/A;   COSMETIC SURGERY  1967   CYST REMOVAL LEG Right    ESOPHAGOGASTRODUODENOSCOPY  06/29/2017   Procedure: ESOPHAGOGASTRODUODENOSCOPY (EGD);  Surgeon: [name], MD;  Location: [facility];  Service: Endoscopy;;   POLYPECTOMY  06/29/2017   Procedure: POLYPECTOMY INTESTINAL;  Surgeon: [name], MD;  Location: [facility];  Service: Endoscopy;;   SKIN GRAFT     TEE WITHOUT CARDIOVERSION N/A 12/25/2015   Procedure: TRANSESOPHAGEAL ECHOCARDIOGRAM (TEE);  Surgeon: Melrose Nakayama, MD;  Location: [facility];  Service: Open Heart Surgery;  Laterality: N/A;   TONSILLECTOMY     UVULOPALATOPHARYNGOPLASTY      Allergies  Allergies  Allergen Reactions   Morphine And Related Nausea And Vomiting   Synalgos-Dc [Aspirin-Caff-Dihydrocodeine] Itching and Other (See Comments)    Reaction:  Stinging    Synchlor A [Chlorpheniramine] Rash and Other (See Comments)    Patient doesn't recall this allergy.    History of Present Illness    68 year old male with above complex past medical history including CAD status post four-vessel bypass in 2017, poorly controlled hypertension, remote tobacco abuse, hyperlipidemia, diabetes, depression, and anxiety.  He recently reestablished care in our clinic after being followed at [facility] for 3 years.  When seen in September, he reported a 49-month history of progressive exertional chest pain and dyspnea as well as occasional dizziness and unsteady gait.  Stress testing was undertaken and returned moderate to high risk with mid to distal inferolateral ischemia.  Today, he says that he has continued to have almost daily exertional chest tightness  associated with dyspnea lasting minutes to up to 1 hour, and resolving with rest.  He has had to use nitroglycerin.  He is interested in pursuing diagnostic catheterization.  He denies PND, orthopnea, syncope, edema, or early satiety.  He has had several ER visits and hospitalizations related to recurrent urinary tract infections and also hypertension.  He was most recently discharged from [facility] on October 9 due to UTI with hematuria and altered mental status, felt to be secondary to encephalopathy from UTI.  He has since been feeling better.  Home Medications    Prior to Admission medications   Medication Sig Start Date End Date Taking? Authorizing Provider  acetaminophen (TYLENOL) 500 MG tablet Take 1,000 mg by mouth every 6 (six) hours as needed for moderate pain or headache.    Yes [provider]  aspirin (ASPIRIN 81) 81 MG  EC tablet Take 81 mg by mouth daily.    Yes [provider]  atorvastatin (LIPITOR) 40 MG tablet Take 1 tablet (40 mg total) by mouth daily at 6 PM. 12/30/15  Yes Barrett, Erin R, PA-C  buPROPion (WELLBUTRIN XL) 300 MG 24 hr tablet Take 300 mg by mouth daily. noon   Yes [provider]  carvedilol (COREG) 12.5 MG tablet Take 12.5 mg by mouth 2 (two) times daily.    Yes [provider]  cephALEXin (KEFLEX) 500 MG capsule Take 1 capsule (500 mg total) by mouth every 8 (eight) hours. 01/04/19  Yes Gouru, Illene Silver, MD  doxepin (SINEQUAN) 25 MG capsule Take 50 mg by mouth at bedtime.    Yes [provider]  fluticasone (FLONASE) 50 MCG/ACT nasal spray Place 2 sprays into the nose daily.  06/19/18  Yes [provider]  gabapentin (NEURONTIN) 300 MG capsule Take 300 mg by mouth 3 (three) times daily.    Yes [provider]  hydrALAZINE (APRESOLINE) 25 MG tablet Take 1 tablet (25 mg total) by mouth every 8 (eight) hours. 12/13/18  Yes Agbor-Etang, Aaron Edelman, MD  Ipratropium-Albuterol (COMBIVENT RESPIMAT) 20-100 MCG/ACT  AERS respimat INHALE ONE PUFF INTO THE LUNGS EVERY 6 HOURS Patient taking differently: Inhale 1 puff into the lungs every 6 (six) hours.  11/21/18  Yes Flora Lipps, MD  isosorbide mononitrate (IMDUR) 30 MG 24 hr tablet Take 30 mg by mouth daily. 08/13/18  Yes [provider]  JANUMET 50-1000 MG tablet Take 1 tablet by mouth 2 (two) times daily. Am and pm 08/02/16  Yes [provider]  lisinopril (PRINIVIL,ZESTRIL) 20 MG tablet Take 20 mg by mouth 2 (two) times daily.    Yes [provider]  LORazepam (ATIVAN) 1 MG tablet Take 1-2 tablets (1-2 mg total) by mouth See admin instructions. Take 1 mg by mouth in the morning and take 2 mg by mouth at bedtime 12/12/18  Yes Fritzi Mandes, MD  nitroGLYCERIN (NITROLINGUAL) 0.4 MG/SPRAY spray Place 1 spray under the tongue every 5 (five) minutes x 3 doses as needed for chest pain.   Yes [provider]  pantoprazole (PROTONIX) 40 MG tablet Take 40 mg by mouth daily.    Yes [provider]  risperidone (RISPERDAL) 1 MG tablet Take 1 tablet (1 mg total) by mouth at bedtime. 12/12/18  Yes Fritzi Mandes, MD  SYMBICORT 160-4.5 MCG/ACT inhaler INHALE TWO PUFFS INTO THE LUNGS TWICE DAILY 12/20/18  Yes Flora Lipps, MD  tamsulosin (FLOMAX) 0.4 MG CAPS capsule Take 1 capsule (0.4 mg total) by mouth daily. 09/26/18  Yes McGowan, Larene Beach A, PA-C  topiramate (TOPAMAX) 100 MG tablet Take 100 mg by mouth daily at 12 noon.    Yes [provider]    Family History    Family History  Problem Relation Age of Onset   Heart disease Mother    Heart disease Brother    Prostate cancer Father    Heart disease Other    Heart disease Brother    Kidney disease Neg Hx    Kidney cancer Neg Hx    Bladder Cancer Neg Hx    He indicated that his mother is deceased. He indicated that his father is deceased. He reported the following about one of his brothers: lung cancer. He indicated that the status of his neg hx is unknown. He  indicated that the status of his other is unknown.  Social History    Social History   Socioeconomic  History   Marital status: Married    Spouse name: Not on file   Number of children: Not on file   Years of education: Not on file   Highest education level: Not on file  Occupational History   Not on file  Social Needs   Financial resource strain: Not on file   Food insecurity    Worry: Not on file    Inability: Not on file   Transportation needs    Medical: Not on file    Non-medical: Not on file  Tobacco Use   Smoking status: Former Smoker    Packs/day: 0.50    Years: 15.00    Pack years: 7.50    Types: Cigarettes    Quit date: 05/18/1999    Years since quitting: 19.6   Smokeless tobacco: Never Used   Tobacco comment: quit 17 + years  Substance and Sexual Activity   Alcohol use: No    Alcohol/week: 0.0 standard drinks   Drug use: No   Sexual activity: Not on file  Lifestyle   Physical activity    Days per week: Not on file    Minutes per session: Not on file   Stress: Not on file  Relationships   Social connections    Talks on phone: Not on file    Gets together: Not on file    Attends religious service: Not on file    Active member of club or organization: Not on file    Attends meetings of clubs or organizations: Not on file    Relationship status: Not on file   Intimate partner violence    Fear of current or ex partner: Not on file    Emotionally abused: Not on file    Physically abused: Not on file    Forced sexual activity: Not on file  Other Topics Concern   Not on file  Social History Narrative   ** Merged History Encounter **       Review of Systems    General:  No chills, fever, night sweats or weight changes.  Cardiovascular:  +++ chest pain, +++dyspnea on exertion, no edema, orthopnea, palpitations, paroxysmal nocturnal dyspnea. Dermatological: No rash, lesions/masses Respiratory: No cough, +++ dyspnea Urologic: No  hematuria, dysuria Abdominal:   No nausea, vomiting, diarrhea, bright red blood per rectum, melena, or hematemesis Neurologic:  No visual changes, wkns, changes in mental status. All other systems reviewed and are otherwise negative except as noted above.  Physical Exam    VS:  BP (!) 180/90 (BP Location: Left Arm, Patient Position: Sitting, Cuff Size: Normal)    Pulse 64    Temp (!) 97.1 F (36.2 C)    Ht 6\' 3"  (1.905 m)    Wt 212 lb 12 oz (96.5 kg)    SpO2 98%    BMI 26.59 kg/m  , BMI Body mass index is 26.59 kg/m. GEN: Well nourished, well developed, in no acute distress. HEENT: normal. Neck: Supple, no JVD, carotid bruits, or masses. Cardiac: RRR, no murmurs, rubs, or gallops. No clubbing, cyanosis, edema.  Radials/PT 2+ and equal bilaterally.  Respiratory:  Respirations regular and unlabored, clear to auscultation bilaterally. GI: Soft, nontender, nondistended, BS + x 4. MS: no deformity or atrophy. Skin: warm and dry, no rash. Neuro:  Strength and sensation are intact. Psych: Flat affect.  Accessory Clinical Findings    ECG personally reviewed by me today-regular sinus rhythm, 64, left axis, left anterior fascicular block, lateral infarct, inferolateral T wave  inversion - No acute changes  Lab Results  Component Value Date   WBC 9.9 01/03/2019   HGB 12.6 (L) 01/03/2019   HCT 39.5 01/03/2019   MCV 89.2 01/03/2019   PLT 182 01/03/2019   Lab Results  Component Value Date   CREATININE 0.96 01/03/2019   BUN 23 01/03/2019   NA 140 01/03/2019   K 3.8 01/03/2019   CL 112 (H) 01/03/2019   CO2 22 01/03/2019   Lab Results  Component Value Date   ALT 16 01/02/2019   AST 15 01/02/2019   ALKPHOS 64 01/02/2019   BILITOT 0.6 01/02/2019   Lab Results  Component Value Date   CHOL 91 09/05/2018   HDL 27 (L) 09/05/2018   LDLCALC 52 09/05/2018   TRIG 58 09/05/2018   CHOLHDL 3.4 09/05/2018     Assessment & Plan   1.  Unstable angina/coronary artery disease: Patient with  prior circumflex stenting and subsequent CABG x4 in 2017.  He recently reestablished care in our clinic in September and complained of a 6-month history of progressive dyspnea on exertion and chest pain.  Follow-up stress testing was moderate to high risk with evidence of mid to distal inferolateral ischemia.  Patient is continued to have rest and exertional chest pain and dyspnea since his last visit.  We discussed options for management including diagnostic catheterization.  The patient understands that risks include but are not limited to stroke (1 in 1000), death (1 in 31), kidney failure [usually temporary] (1 in 500), bleeding (1 in 200), allergic reaction [possibly serious] (1 in 200), and agrees to proceed.  Follow-up labs today.  Continue beta-blocker, aspirin, statin, and ACE inhibitor therapy.  I am going to titrate his isosorbide mononitrate to 60 mg daily given ongoing symptoms and hypertension.  2.  Essential hypertension: Poorly controlled at 180/90 today.  This is been an ongoing issue.  He is currently on carvedilol, hydralazine, isosorbide, lisinopril.  I will titrate his hydralazine to 50 3 times daily and also his isosorbide to 60 daily.  3.  Hyperlipidemia: LDL of 52 in June.  Continue statin therapy.  4.  Type 2 diabetes mellitus: A1c 5.7 in September on oral therapy.  5.  Disposition: Follow-up lab work today with plan for outpatient diagnostic catheterization next week.  Murray Hodgkins, NP 01/08/2019, 11:07 AM

## 2019-01-07 NOTE — H&P (View-Only) (Signed)
Cardiology Clinic Note   Patient Name: Roger Pruitt Date of Encounter: 01/08/2019  Primary Care Provider:  Casilda Carls, MD Primary Cardiologist:  Kate Sable, MD  Patient Profile    68 year old male with a history of CAD status post four-vessel bypass in 2017, poorly controlled hypertension, remote tobacco abuse, hyperlipidemia, diabetes, depression, and anxiety, who presents for follow-up related to a 2 to 72-month history of rest and exertional chest pain and recent abnormal stress test.  Past Medical History    Past Medical History:  Diagnosis Date   Abdominal pain    Acute cystitis without hematuria    Anemia    Anginal pain ([place])    rarely   Arthritis    knees and hands   Asthma    Bipolar disorder ([place])    BIPOLAR/ SCHIZO/ AFFECTIVE    BPD (bronchopulmonary dysplasia)    BPH (benign prostatic hyperplasia)    CAD (coronary artery disease)    a. s/p MI 2001, 2012;  b. s/p prior LCX stenting; b. 05/2012 Abnl MV; c. 11/2015 Cath: 3VD-->12/2015 CABG x 4 (LIMA->LAD, VG->D1, VG->OM2, VG->RPDA); d. 12/2018 MV: mid-dist inflat ischemia. Basal inf lat infarct. EF 55%.   Carpal tunnel syndrome    surgery both hands   Depression    Diastolic dysfunction    a. 07/2014 Echo: EF 60-65%, no rwma, nl LA size, nl RV/PASP; b. 12/2018 Echo: EF 50-55%, mod LVH. Inf HK. Impaired relaxation. Mildly red RV fxn. Mildly dil LA. Ao root 29mm.   Disc degeneration, lumbar    Dyspnea    WITH EXERTION   Elevated PSA    Esophageal reflux    GERD (gastroesophageal reflux disease)    Hematuria    history of gross and microscopic   Hemorrhoid    History of balanitis    History of bladder infections    History of urethral stricture    Hyperlipidemia    Hypertension    controlled   Hypertensive heart disease    Incomplete bladder emptying    Iron deficiency anemia, unspecified    Lower urinary tract symptoms    Myocardial infarction ([place])    X7 LAST  2001   Neuropathy    Nocturia    Obsessive compulsive disorder    Other dysphagia    Panic attack    Parkinson's disease ([place])    Sleep apnea    SURGICAL TX   Stroke ([place])    Type II diabetes mellitus ([place])    Unspecified vitamin D deficiency    Urethral stricture    Urinary retention    Varicose vein of leg    Vertigo    hx of   Vitamin D deficiency    Wears dentures    upper and lowers   Past Surgical History:  Procedure Laterality Date   BACK SURGERY     lumbar disc   BACK SURGERY  1984   CARDIAC CATHETERIZATION  6/10;2011;Aug.2012;Oct. 2012;    CARDIAC CATHETERIZATION  05/2012   [facility]; EF 60%, patent stent in the left circumflex, moderate three-vessel disease with no flow-limiting lesions. Unchanged from most recent catheterization   CARDIAC CATHETERIZATION N/A 12/10/2015   Procedure: Left Heart Cath and Coronary Angiography;  Surgeon: Corey Skains, MD;  Location: [facility];  Service: Cardiovascular;  Laterality: N/A;   CARPAL TUNNEL RELEASE  2009   left   CATARACT EXTRACTION W/PHACO Left 02/14/2017   Procedure: CATARACT EXTRACTION PHACO AND INTRAOCULAR LENS PLACEMENT ([place]);  Surgeon: George Ina,  Gwyndolyn Saxon, MD;  Location: [facility];  Service: Ophthalmology;  Laterality: Left;  [place] 00:42 AP% 16.6 CDE 7.14 Fluid pack lot # [id number]   CATARACT EXTRACTION W/PHACO Right 02/28/2017   Procedure: CATARACT EXTRACTION PHACO AND INTRAOCULAR LENS PLACEMENT (IOC);  Surgeon: Birder Robson, MD;  Location: [facility];  Service: Ophthalmology;  Laterality: Right;  [place] 01:09 AP% 11.4 CDE 7.98 Fluid pack lot # [id number]   COLONOSCOPY WITH PROPOFOL N/A 06/29/2017   Procedure: COLONOSCOPY WITH PROPOFOL;  Surgeon: [name], MD;  Location: [place];  Service: Endoscopy;  Laterality: N/A;  Diabetic - oral meds   CORONARY ANGIOPLASTY  2011 & 2001   s/p stent   CORONARY ARTERY BYPASS GRAFT     stent placement   CORONARY ARTERY BYPASS GRAFT N/A  12/25/2015   Procedure: CORONARY ARTERY BYPASS GRAFTING (CABG) x4  , using left internal mammary artery and right leg greater saphenous vein harvested endoscopically LIMA-LAD SVG-OM2 SVG-DIAG SVG-PD;  Surgeon: Melrose Nakayama, MD;  Location: [place];  Service: Open Heart Surgery;  Laterality: N/A;   COSMETIC SURGERY  1967   CYST REMOVAL LEG Right    ESOPHAGOGASTRODUODENOSCOPY  06/29/2017   Procedure: ESOPHAGOGASTRODUODENOSCOPY (EGD);  Surgeon: [name], MD;  Location: [facility];  Service: Endoscopy;;   POLYPECTOMY  06/29/2017   Procedure: POLYPECTOMY INTESTINAL;  Surgeon: [name], MD;  Location: [facility];  Service: Endoscopy;;   SKIN GRAFT     TEE WITHOUT CARDIOVERSION N/A 12/25/2015   Procedure: TRANSESOPHAGEAL ECHOCARDIOGRAM (TEE);  Surgeon: Melrose Nakayama, MD;  Location: [facility];  Service: Open Heart Surgery;  Laterality: N/A;   TONSILLECTOMY     UVULOPALATOPHARYNGOPLASTY      Allergies  Allergies  Allergen Reactions   Morphine And Related Nausea And Vomiting   Synalgos-Dc [Aspirin-Caff-Dihydrocodeine] Itching and Other (See Comments)    Reaction:  Stinging    Synchlor A [Chlorpheniramine] Rash and Other (See Comments)    Patient doesn't recall this allergy.    History of Present Illness    68 year old male with above complex past medical history including CAD status post four-vessel bypass in 2017, poorly controlled hypertension, remote tobacco abuse, hyperlipidemia, diabetes, depression, and anxiety.  He recently reestablished care in our clinic after being followed at [facility] for 3 years.  When seen in September, he reported a 80-month history of progressive exertional chest pain and dyspnea as well as occasional dizziness and unsteady gait.  Stress testing was undertaken and returned moderate to high risk with mid to distal inferolateral ischemia.  Today, he says that he has continued to have almost daily exertional chest tightness  associated with dyspnea lasting minutes to up to 1 hour, and resolving with rest.  He has had to use nitroglycerin.  He is interested in pursuing diagnostic catheterization.  He denies PND, orthopnea, syncope, edema, or early satiety.  He has had several ER visits and hospitalizations related to recurrent urinary tract infections and also hypertension.  He was most recently discharged from [facility] on October 9 due to UTI with hematuria and altered mental status, felt to be secondary to encephalopathy from UTI.  He has since been feeling better.  Home Medications    Prior to Admission medications   Medication Sig Start Date End Date Taking? Authorizing Provider  acetaminophen (TYLENOL) 500 MG tablet Take 1,000 mg by mouth every 6 (six) hours as needed for moderate pain or headache.    Yes [provider]  aspirin (ASPIRIN 81) 81 MG  EC tablet Take 81 mg by mouth daily.    Yes [provider]  atorvastatin (LIPITOR) 40 MG tablet Take 1 tablet (40 mg total) by mouth daily at 6 PM. 12/30/15  Yes Barrett, Erin R, PA-C  buPROPion (WELLBUTRIN XL) 300 MG 24 hr tablet Take 300 mg by mouth daily. noon   Yes [provider]  carvedilol (COREG) 12.5 MG tablet Take 12.5 mg by mouth 2 (two) times daily.    Yes [provider]  cephALEXin (KEFLEX) 500 MG capsule Take 1 capsule (500 mg total) by mouth every 8 (eight) hours. 01/04/19  Yes Gouru, Illene Silver, MD  doxepin (SINEQUAN) 25 MG capsule Take 50 mg by mouth at bedtime.    Yes [provider]  fluticasone (FLONASE) 50 MCG/ACT nasal spray Place 2 sprays into the nose daily.  06/19/18  Yes [provider]  gabapentin (NEURONTIN) 300 MG capsule Take 300 mg by mouth 3 (three) times daily.    Yes [provider]  hydrALAZINE (APRESOLINE) 25 MG tablet Take 1 tablet (25 mg total) by mouth every 8 (eight) hours. 12/13/18  Yes Agbor-Etang, Aaron Edelman, MD  Ipratropium-Albuterol (COMBIVENT RESPIMAT) 20-100 MCG/ACT  AERS respimat INHALE ONE PUFF INTO THE LUNGS EVERY 6 HOURS Patient taking differently: Inhale 1 puff into the lungs every 6 (six) hours.  11/21/18  Yes Flora Lipps, MD  isosorbide mononitrate (IMDUR) 30 MG 24 hr tablet Take 30 mg by mouth daily. 08/13/18  Yes [provider]  JANUMET 50-1000 MG tablet Take 1 tablet by mouth 2 (two) times daily. Am and pm 08/02/16  Yes [provider]  lisinopril (PRINIVIL,ZESTRIL) 20 MG tablet Take 20 mg by mouth 2 (two) times daily.    Yes [provider]  LORazepam (ATIVAN) 1 MG tablet Take 1-2 tablets (1-2 mg total) by mouth See admin instructions. Take 1 mg by mouth in the morning and take 2 mg by mouth at bedtime 12/12/18  Yes Fritzi Mandes, MD  nitroGLYCERIN (NITROLINGUAL) 0.4 MG/SPRAY spray Place 1 spray under the tongue every 5 (five) minutes x 3 doses as needed for chest pain.   Yes [provider]  pantoprazole (PROTONIX) 40 MG tablet Take 40 mg by mouth daily.    Yes [provider]  risperidone (RISPERDAL) 1 MG tablet Take 1 tablet (1 mg total) by mouth at bedtime. 12/12/18  Yes Fritzi Mandes, MD  SYMBICORT 160-4.5 MCG/ACT inhaler INHALE TWO PUFFS INTO THE LUNGS TWICE DAILY 12/20/18  Yes Flora Lipps, MD  tamsulosin (FLOMAX) 0.4 MG CAPS capsule Take 1 capsule (0.4 mg total) by mouth daily. 09/26/18  Yes McGowan, Larene Beach A, PA-C  topiramate (TOPAMAX) 100 MG tablet Take 100 mg by mouth daily at 12 noon.    Yes [provider]    Family History    Family History  Problem Relation Age of Onset   Heart disease Mother    Heart disease Brother    Prostate cancer Father    Heart disease Other    Heart disease Brother    Kidney disease Neg Hx    Kidney cancer Neg Hx    Bladder Cancer Neg Hx    He indicated that his mother is deceased. He indicated that his father is deceased. He reported the following about one of his brothers: lung cancer. He indicated that the status of his neg hx is unknown. He  indicated that the status of his other is unknown.  Social History    Social History   Socioeconomic  History   Marital status: Married    Spouse name: Not on file   Number of children: Not on file   Years of education: Not on file   Highest education level: Not on file  Occupational History   Not on file  Social Needs   Financial resource strain: Not on file   Food insecurity    Worry: Not on file    Inability: Not on file   Transportation needs    Medical: Not on file    Non-medical: Not on file  Tobacco Use   Smoking status: Former Smoker    Packs/day: 0.50    Years: 15.00    Pack years: 7.50    Types: Cigarettes    Quit date: 05/18/1999    Years since quitting: 19.6   Smokeless tobacco: Never Used   Tobacco comment: quit 17 + years  Substance and Sexual Activity   Alcohol use: No    Alcohol/week: 0.0 standard drinks   Drug use: No   Sexual activity: Not on file  Lifestyle   Physical activity    Days per week: Not on file    Minutes per session: Not on file   Stress: Not on file  Relationships   Social connections    Talks on phone: Not on file    Gets together: Not on file    Attends religious service: Not on file    Active member of club or organization: Not on file    Attends meetings of clubs or organizations: Not on file    Relationship status: Not on file   Intimate partner violence    Fear of current or ex partner: Not on file    Emotionally abused: Not on file    Physically abused: Not on file    Forced sexual activity: Not on file  Other Topics Concern   Not on file  Social History Narrative   ** Merged History Encounter **       Review of Systems    General:  No chills, fever, night sweats or weight changes.  Cardiovascular:  +++ chest pain, +++dyspnea on exertion, no edema, orthopnea, palpitations, paroxysmal nocturnal dyspnea. Dermatological: No rash, lesions/masses Respiratory: No cough, +++ dyspnea Urologic: No  hematuria, dysuria Abdominal:   No nausea, vomiting, diarrhea, bright red blood per rectum, melena, or hematemesis Neurologic:  No visual changes, wkns, changes in mental status. All other systems reviewed and are otherwise negative except as noted above.  Physical Exam    VS:  BP (!) 180/90 (BP Location: Left Arm, Patient Position: Sitting, Cuff Size: Normal)    Pulse 64    Temp (!) 97.1 F (36.2 C)    Ht 6\' 3"  (1.905 m)    Wt 212 lb 12 oz (96.5 kg)    SpO2 98%    BMI 26.59 kg/m  , BMI Body mass index is 26.59 kg/m. GEN: Well nourished, well developed, in no acute distress. HEENT: normal. Neck: Supple, no JVD, carotid bruits, or masses. Cardiac: RRR, no murmurs, rubs, or gallops. No clubbing, cyanosis, edema.  Radials/PT 2+ and equal bilaterally.  Respiratory:  Respirations regular and unlabored, clear to auscultation bilaterally. GI: Soft, nontender, nondistended, BS + x 4. MS: no deformity or atrophy. Skin: warm and dry, no rash. Neuro:  Strength and sensation are intact. Psych: Flat affect.  Accessory Clinical Findings    ECG personally reviewed by me today-regular sinus rhythm, 64, left axis, left anterior fascicular block, lateral infarct, inferolateral T wave  inversion - No acute changes  Lab Results  Component Value Date   WBC 9.9 01/03/2019   HGB 12.6 (L) 01/03/2019   HCT 39.5 01/03/2019   MCV 89.2 01/03/2019   PLT 182 01/03/2019   Lab Results  Component Value Date   CREATININE 0.96 01/03/2019   BUN 23 01/03/2019   NA 140 01/03/2019   K 3.8 01/03/2019   CL 112 (H) 01/03/2019   CO2 22 01/03/2019   Lab Results  Component Value Date   ALT 16 01/02/2019   AST 15 01/02/2019   ALKPHOS 64 01/02/2019   BILITOT 0.6 01/02/2019   Lab Results  Component Value Date   CHOL 91 09/05/2018   HDL 27 (L) 09/05/2018   LDLCALC 52 09/05/2018   TRIG 58 09/05/2018   CHOLHDL 3.4 09/05/2018     Assessment & Plan   1.  Unstable angina/coronary artery disease: Patient with  prior circumflex stenting and subsequent CABG x4 in 2017.  He recently reestablished care in our clinic in September and complained of a 50-month history of progressive dyspnea on exertion and chest pain.  Follow-up stress testing was moderate to high risk with evidence of mid to distal inferolateral ischemia.  Patient is continued to have rest and exertional chest pain and dyspnea since his last visit.  We discussed options for management including diagnostic catheterization.  The patient understands that risks include but are not limited to stroke (1 in 1000), death (1 in 18), kidney failure [usually temporary] (1 in 500), bleeding (1 in 200), allergic reaction [possibly serious] (1 in 200), and agrees to proceed.  Follow-up labs today.  Continue beta-blocker, aspirin, statin, and ACE inhibitor therapy.  I am going to titrate his isosorbide mononitrate to 60 mg daily given ongoing symptoms and hypertension.  2.  Essential hypertension: Poorly controlled at 180/90 today.  This is been an ongoing issue.  He is currently on carvedilol, hydralazine, isosorbide, lisinopril.  I will titrate his hydralazine to 50 3 times daily and also his isosorbide to 60 daily.  3.  Hyperlipidemia: LDL of 52 in June.  Continue statin therapy.  4.  Type 2 diabetes mellitus: A1c 5.7 in September on oral therapy.  5.  Disposition: Follow-up lab work today with plan for outpatient diagnostic catheterization next week.  Murray Hodgkins, NP 01/08/2019, 11:07 AM

## 2019-01-07 NOTE — Progress Notes (Signed)
Simple Catheter Placement  Due to urinary retention patient is present today for a foley cath placement.  Patient was cleaned and prepped in a sterile fashion with betadine and lidocaine jelly 2% was instilled into the urethra.  A 16 FR coudfe foley catheter was inserted, urine return was noted  759ml, urine was clear yellow in color.  The balloon was filled with 10cc of sterile water.  A leg bag was attached for drainage. Patient was also given a night bag to take home and was given instruction on how to change from one bag to another.  Patient was given instruction on proper catheter care.  Patient tolerated well, no complications were noted   Performed by: Elizabeth Palau, CMA(AAMA)  Additional notes/ Follow up: as directed

## 2019-01-08 ENCOUNTER — Other Ambulatory Visit: Payer: Self-pay | Admitting: *Deleted

## 2019-01-08 ENCOUNTER — Ambulatory Visit (INDEPENDENT_AMBULATORY_CARE_PROVIDER_SITE_OTHER): Payer: Medicare Other | Admitting: Nurse Practitioner

## 2019-01-08 ENCOUNTER — Encounter: Payer: Self-pay | Admitting: Nurse Practitioner

## 2019-01-08 VITALS — BP 180/90 | HR 64 | Temp 97.1°F | Ht 75.0 in | Wt 212.8 lb

## 2019-01-08 DIAGNOSIS — N390 Urinary tract infection, site not specified: Secondary | ICD-10-CM | POA: Diagnosis not present

## 2019-01-08 DIAGNOSIS — R079 Chest pain, unspecified: Secondary | ICD-10-CM

## 2019-01-08 DIAGNOSIS — N400 Enlarged prostate without lower urinary tract symptoms: Secondary | ICD-10-CM | POA: Diagnosis not present

## 2019-01-08 DIAGNOSIS — I25110 Atherosclerotic heart disease of native coronary artery with unstable angina pectoris: Secondary | ICD-10-CM

## 2019-01-08 DIAGNOSIS — I200 Unstable angina: Secondary | ICD-10-CM | POA: Diagnosis not present

## 2019-01-08 DIAGNOSIS — E119 Type 2 diabetes mellitus without complications: Secondary | ICD-10-CM

## 2019-01-08 DIAGNOSIS — I10 Essential (primary) hypertension: Secondary | ICD-10-CM

## 2019-01-08 DIAGNOSIS — E785 Hyperlipidemia, unspecified: Secondary | ICD-10-CM

## 2019-01-08 DIAGNOSIS — R739 Hyperglycemia, unspecified: Secondary | ICD-10-CM | POA: Diagnosis not present

## 2019-01-08 DIAGNOSIS — F319 Bipolar disorder, unspecified: Secondary | ICD-10-CM | POA: Diagnosis not present

## 2019-01-08 MED ORDER — HYDRALAZINE HCL 50 MG PO TABS: 50.0000 mg | ORAL_TABLET | Freq: Three times a day (TID) | ORAL | 6 refills | Status: AC

## 2019-01-08 MED ORDER — ISOSORBIDE MONONITRATE ER 60 MG PO TB24: 60.0000 mg | ORAL_TABLET | Freq: Every day | ORAL | 3 refills | Status: AC

## 2019-01-08 NOTE — Patient Outreach (Signed)
[facility]) Care Management  01/08/2019  Roger Pruitt Sep 28, 1950 [id number]    EMMI-GENERAL DISCHARGE  RED ON EMMI ALERT Day # 1 Date: 01/07/2019 Red Alert Reason:-question d/c paperwork, don't know who to call if changes occur and questions and concerns.   Outreach attempt # 1 RN spoke with pt and introduced the [facility] services/program and purpose for today's call. Pt receptive and provided additional information. RN inquired on the above inquires on as pt verified no questions or concerns that he understood the discharge paperwork. RN further inquired on pending appointments. Pt reports he has an office visit today with his primary provider Dr. Rosario Jacks. RN further inquired on any other issues or concerns that he may need assistance with at this time. Pt indicated no other concerns.    Plan: Based upon the conversation with pt today no other needs presented and issues are all resolved.  RN will close this red emmi from any further inquires.    Raina Mina, RN Care Management Coordinator [place] Office [phone]

## 2019-01-08 NOTE — Patient Instructions (Signed)
Medication Instructions:  1- INCREASE Hydralazine Take 1 tablet (50 mg total) by mouth 3 (three) times daily.  2- INCREASE Imdur Take 1 tablet (60 mg total) by mouth daily. If you need a refill on your cardiac medications before your next appointment, please call your pharmacy.   Lab work: 1- Your physician recommends that you have lab work today(CBC, Art gallery manager)  2- CV19 Pre admit testing DRIVE THRU  Please report to the PAT testing site (medical arts building) on _____Fri 10/16____ date ______12:30-2:30PM_____ time for your DRIVE THRU covid testing that is required prior to your procedure.  Following covid testing, please remain in quarantine. If you must be around others, please wash hands, avoid touching face and wear your mask.    If you have labs (blood work) drawn today and your tests are completely normal, you will receive your results only by: Marland Kitchen MyChart Message (if you have MyChart) OR . A paper copy in the mail If you have any lab test that is abnormal or we need to change your treatment, we will call you to review the results.  Testing/Procedures: 1-    [place], [place] [address] Dept: [phone] Loc: [place]  01/08/2019  You are scheduled for a Cardiac Catheterization on Tuesday, October 20 with Dr. Harrell Gave End.  1. Please arrive at the medical mall of [facility] at 6:30 AM (This time is one hour before your procedure to ensure your preparation). Free valet parking service is available.   Special note: Every effort is made to have your procedure done on time. Please understand that emergencies sometimes delay scheduled procedures.  2. Diet: Do not eat solid foods after midnight.  The patient may have clear liquids until 5am upon the day of the procedure.  3. Labs: Today  4. Medication instructions in preparation for your procedure:  Hold Coreg the evening before and morning of procedure.   On the morning of your procedure, take your Aspirin and any morning medicines NOT listed above.  You may use sips of water.  5. Plan for one night stay--bring personal belongings. 6. Bring a current list of your medications and current insurance cards. 7. You MUST have a responsible person to drive you home. 8. Someone MUST be with you the first 24 hours after you arrive home or your discharge will be delayed. 9. Please wear clothes that are easy to get on and off and wear slip-on shoes.  Thank you for allowing [place] to care for you!   -- [facility]   Follow-Up: At [facility], you and your health needs are our priority.  As part of our continuing mission to provide you with exceptional heart care, we have created designated Provider Care Teams.  These Care Teams include your primary Cardiologist (physician) and Advanced Practice Providers (APPs -  Physician Assistants and Nurse Practitioners) who all work together to provide you with the care you need, when you need it. You will need a follow up appointment in 3 weeks.  You may see Kate Sable, MD or Murray Hodgkins, NP.

## 2019-01-09 ENCOUNTER — Telehealth: Payer: Self-pay

## 2019-01-09 DIAGNOSIS — N390 Urinary tract infection, site not specified: Secondary | ICD-10-CM | POA: Diagnosis not present

## 2019-01-09 DIAGNOSIS — I10 Essential (primary) hypertension: Secondary | ICD-10-CM | POA: Diagnosis not present

## 2019-01-09 DIAGNOSIS — N400 Enlarged prostate without lower urinary tract symptoms: Secondary | ICD-10-CM | POA: Diagnosis not present

## 2019-01-09 DIAGNOSIS — F319 Bipolar disorder, unspecified: Secondary | ICD-10-CM | POA: Diagnosis not present

## 2019-01-09 DIAGNOSIS — E785 Hyperlipidemia, unspecified: Secondary | ICD-10-CM | POA: Diagnosis not present

## 2019-01-09 DIAGNOSIS — R739 Hyperglycemia, unspecified: Secondary | ICD-10-CM | POA: Diagnosis not present

## 2019-01-09 LAB — BASIC METABOLIC PANEL
BUN/Creatinine Ratio: 14 (ref 10–24)
BUN: 13 mg/dL (ref 8–27)
CO2: 21 mmol/L (ref 20–29)
Calcium: 9 mg/dL (ref 8.6–10.2)
Chloride: 108 mmol/L — ABNORMAL HIGH (ref 96–106)
Creatinine, Ser: 0.92 mg/dL (ref 0.76–1.27)
GFR calc Af Amer: 99 mL/min/{1.73_m2} (ref >59)
GFR calc non Af Amer: 86 mL/min/{1.73_m2} (ref >59)
Glucose: 89 mg/dL (ref 65–99)
Potassium: 4.3 mmol/L (ref 3.5–5.2)
Sodium: 141 mmol/L (ref 134–144)

## 2019-01-09 LAB — CBC
Hematocrit: 36.3 % — ABNORMAL LOW (ref 37.5–51.0)
Hemoglobin: 11.8 g/dL — ABNORMAL LOW (ref 13.0–17.7)
MCH: 28.6 pg (ref 26.6–33.0)
MCHC: 32.5 g/dL (ref 31.5–35.7)
MCV: 88 fL (ref 79–97)
Platelets: 192 10*3/uL (ref 150–450)
RBC: 4.12 x10E6/uL — ABNORMAL LOW (ref 4.14–5.80)
RDW: 13.1 % (ref 11.6–15.4)
WBC: 8.4 10*3/uL (ref 3.4–10.8)

## 2019-01-09 LAB — CULTURE, URINE COMPREHENSIVE

## 2019-01-09 NOTE — Telephone Encounter (Signed)
Left detailed message as indicated on DPR. No further orders at this time. Per Sharolyn Douglas, NP, labs suitable to proceed with cath.   Advised pt to call for any further questions or concerns.

## 2019-01-09 NOTE — Telephone Encounter (Signed)
-----   Message from Theora Gianotti, NP sent at 01/09/2019  2:32 PM EDT ----- Labs suitable for cath.

## 2019-01-10 ENCOUNTER — Other Ambulatory Visit: Payer: Medicare Other | Admitting: Urology

## 2019-01-11 ENCOUNTER — Other Ambulatory Visit
Admission: RE | Admit: 2019-01-11 | Discharge: 2019-01-11 | Disposition: A | Discharge status: Home / Self Care | Payer: Medicare Other | Source: Ambulatory Visit | Attending: Internal Medicine | Admitting: Internal Medicine

## 2019-01-11 ENCOUNTER — Other Ambulatory Visit: Payer: Self-pay

## 2019-01-11 DIAGNOSIS — Z01812 Encounter for preprocedural laboratory examination: Secondary | ICD-10-CM | POA: Insufficient documentation

## 2019-01-11 DIAGNOSIS — Z20828 Contact with and (suspected) exposure to other viral communicable diseases: Secondary | ICD-10-CM | POA: Diagnosis not present

## 2019-01-12 LAB — SARS CORONAVIRUS 2 (TAT 6-24 HRS): SARS Coronavirus 2: NEGATIVE

## 2019-01-14 ENCOUNTER — Telehealth: Payer: Self-pay | Admitting: Urology

## 2019-01-14 NOTE — Telephone Encounter (Signed)
Pt called and states that he is having a Heart cath 01/15/2019 and wants to know if it is ok to keep in foley.

## 2019-01-14 NOTE — Telephone Encounter (Signed)
Yes.  It is fine for him to keep the Foley in place for the heart cath.

## 2019-01-14 NOTE — Telephone Encounter (Signed)
Advised patient is was fine to keep foley in place for heart cath.

## 2019-01-15 ENCOUNTER — Ambulatory Visit
Admission: RE | Admit: 2019-01-15 | Discharge: 2019-01-16 | Disposition: A | Discharge status: Home / Self Care | Payer: Medicare Other | Attending: Internal Medicine | Admitting: Internal Medicine

## 2019-01-15 ENCOUNTER — Other Ambulatory Visit: Payer: Self-pay

## 2019-01-15 ENCOUNTER — Encounter
Admission: RE | Disposition: A | Discharge status: Home / Self Care | Payer: Self-pay | Source: Home / Self Care | Attending: Internal Medicine

## 2019-01-15 ENCOUNTER — Encounter: Payer: Self-pay | Admitting: *Deleted

## 2019-01-15 DIAGNOSIS — E1169 Type 2 diabetes mellitus with other specified complication: Secondary | ICD-10-CM | POA: Diagnosis present

## 2019-01-15 DIAGNOSIS — Z955 Presence of coronary angioplasty implant and graft: Secondary | ICD-10-CM | POA: Insufficient documentation

## 2019-01-15 DIAGNOSIS — E785 Hyperlipidemia, unspecified: Secondary | ICD-10-CM | POA: Insufficient documentation

## 2019-01-15 DIAGNOSIS — F319 Bipolar disorder, unspecified: Secondary | ICD-10-CM | POA: Diagnosis not present

## 2019-01-15 DIAGNOSIS — Z79899 Other long term (current) drug therapy: Secondary | ICD-10-CM | POA: Insufficient documentation

## 2019-01-15 DIAGNOSIS — Z885 Allergy status to narcotic agent status: Secondary | ICD-10-CM | POA: Diagnosis not present

## 2019-01-15 DIAGNOSIS — Z951 Presence of aortocoronary bypass graft: Secondary | ICD-10-CM

## 2019-01-15 DIAGNOSIS — R079 Chest pain, unspecified: Secondary | ICD-10-CM

## 2019-01-15 DIAGNOSIS — I25710 Atherosclerosis of autologous vein coronary artery bypass graft(s) with unstable angina pectoris: Secondary | ICD-10-CM

## 2019-01-15 DIAGNOSIS — Z7951 Long term (current) use of inhaled steroids: Secondary | ICD-10-CM | POA: Diagnosis not present

## 2019-01-15 DIAGNOSIS — K219 Gastro-esophageal reflux disease without esophagitis: Secondary | ICD-10-CM | POA: Diagnosis not present

## 2019-01-15 DIAGNOSIS — I252 Old myocardial infarction: Secondary | ICD-10-CM | POA: Diagnosis not present

## 2019-01-15 DIAGNOSIS — Z8673 Personal history of transient ischemic attack (TIA), and cerebral infarction without residual deficits: Secondary | ICD-10-CM | POA: Insufficient documentation

## 2019-01-15 DIAGNOSIS — E119 Type 2 diabetes mellitus without complications: Secondary | ICD-10-CM | POA: Diagnosis not present

## 2019-01-15 DIAGNOSIS — M1990 Unspecified osteoarthritis, unspecified site: Secondary | ICD-10-CM | POA: Diagnosis not present

## 2019-01-15 DIAGNOSIS — E1159 Type 2 diabetes mellitus with other circulatory complications: Secondary | ICD-10-CM | POA: Diagnosis present

## 2019-01-15 DIAGNOSIS — F429 Obsessive-compulsive disorder, unspecified: Secondary | ICD-10-CM | POA: Insufficient documentation

## 2019-01-15 DIAGNOSIS — G20 Parkinson's disease: Secondary | ICD-10-CM | POA: Diagnosis not present

## 2019-01-15 DIAGNOSIS — I25110 Atherosclerotic heart disease of native coronary artery with unstable angina pectoris: Secondary | ICD-10-CM | POA: Diagnosis not present

## 2019-01-15 DIAGNOSIS — N401 Enlarged prostate with lower urinary tract symptoms: Secondary | ICD-10-CM | POA: Insufficient documentation

## 2019-01-15 DIAGNOSIS — F329 Major depressive disorder, single episode, unspecified: Secondary | ICD-10-CM | POA: Insufficient documentation

## 2019-01-15 DIAGNOSIS — F419 Anxiety disorder, unspecified: Secondary | ICD-10-CM | POA: Diagnosis not present

## 2019-01-15 DIAGNOSIS — I110 Hypertensive heart disease with heart failure: Secondary | ICD-10-CM | POA: Insufficient documentation

## 2019-01-15 DIAGNOSIS — G4730 Sleep apnea, unspecified: Secondary | ICD-10-CM | POA: Insufficient documentation

## 2019-01-15 DIAGNOSIS — I200 Unstable angina: Secondary | ICD-10-CM | POA: Diagnosis present

## 2019-01-15 DIAGNOSIS — J45909 Unspecified asthma, uncomplicated: Secondary | ICD-10-CM | POA: Insufficient documentation

## 2019-01-15 DIAGNOSIS — Z87891 Personal history of nicotine dependence: Secondary | ICD-10-CM | POA: Insufficient documentation

## 2019-01-15 DIAGNOSIS — Z7982 Long term (current) use of aspirin: Secondary | ICD-10-CM | POA: Insufficient documentation

## 2019-01-15 DIAGNOSIS — I2510 Atherosclerotic heart disease of native coronary artery without angina pectoris: Secondary | ICD-10-CM | POA: Diagnosis present

## 2019-01-15 DIAGNOSIS — R338 Other retention of urine: Secondary | ICD-10-CM | POA: Insufficient documentation

## 2019-01-15 DIAGNOSIS — Z7984 Long term (current) use of oral hypoglycemic drugs: Secondary | ICD-10-CM | POA: Diagnosis not present

## 2019-01-15 DIAGNOSIS — I5032 Chronic diastolic (congestive) heart failure: Secondary | ICD-10-CM | POA: Insufficient documentation

## 2019-01-15 DIAGNOSIS — I10 Essential (primary) hypertension: Secondary | ICD-10-CM | POA: Diagnosis present

## 2019-01-15 HISTORY — PX: CORONARY STENT INTERVENTION: CATH118234

## 2019-01-15 HISTORY — PX: LEFT HEART CATH AND CORS/GRAFTS ANGIOGRAPHY: CATH118250

## 2019-01-15 LAB — GLUCOSE, CAPILLARY
Glucose-Capillary: 97 mg/dL (ref 70–99)
Glucose-Capillary: 104 mg/dL — ABNORMAL HIGH (ref 70–99)
Glucose-Capillary: 91 mg/dL (ref 70–99)

## 2019-01-15 LAB — POCT ACTIVATED CLOTTING TIME
Activated Clotting Time: 219 seconds
Activated Clotting Time: 252 seconds
Activated Clotting Time: 268 seconds
Activated Clotting Time: 268 seconds

## 2019-01-15 SURGERY — LEFT HEART CATH AND CORS/GRAFTS ANGIOGRAPHY
Anesthesia: Moderate Sedation

## 2019-01-15 MED ORDER — CLOPIDOGREL BISULFATE 75 MG PO TABS
75.0000 mg | ORAL_TABLET | Freq: Every day | ORAL | Status: DC
  Administered 2019-01-16: 75 mg via ORAL
  Filled 2019-01-15: qty 1

## 2019-01-15 MED ORDER — LORAZEPAM 1 MG PO TABS
1.0000 mg | ORAL_TABLET | Freq: Every day | ORAL | Status: DC
  Administered 2019-01-16: 1 mg via ORAL
  Filled 2019-01-15: qty 1

## 2019-01-15 MED ORDER — CLOPIDOGREL BISULFATE 75 MG PO TABS
ORAL_TABLET | ORAL | Status: AC
  Filled 2019-01-15: qty 6

## 2019-01-15 MED ORDER — VERAPAMIL HCL 2.5 MG/ML IV SOLN
INTRAVENOUS | Status: AC
  Filled 2019-01-15: qty 2

## 2019-01-15 MED ORDER — INSULIN ASPART 100 UNIT/ML ~~LOC~~ SOLN: 0.0000 [IU] | Freq: Every day | SUBCUTANEOUS | Status: DC

## 2019-01-15 MED ORDER — INSULIN ASPART 100 UNIT/ML ~~LOC~~ SOLN: 0.0000 [IU] | Freq: Three times a day (TID) | SUBCUTANEOUS | Status: DC

## 2019-01-15 MED ORDER — HEPARIN SODIUM (PORCINE) 1000 UNIT/ML IJ SOLN
INTRAMUSCULAR | Status: AC
  Filled 2019-01-15: qty 1

## 2019-01-15 MED ORDER — SODIUM CHLORIDE 0.9% FLUSH
3.0000 mL | Freq: Two times a day (BID) | INTRAVENOUS | Status: DC
  Administered 2019-01-15: 3 mL via INTRAVENOUS

## 2019-01-15 MED ORDER — VERAPAMIL HCL 2.5 MG/ML IV SOLN
INTRAVENOUS | Status: DC | PRN
  Administered 2019-01-15: 2.5 mg via INTRA_ARTERIAL

## 2019-01-15 MED ORDER — GABAPENTIN 300 MG PO CAPS
300.0000 mg | ORAL_CAPSULE | Freq: Every day | ORAL | Status: DC
  Administered 2019-01-16: 300 mg via ORAL
  Filled 2019-01-15: qty 1

## 2019-01-15 MED ORDER — DOXEPIN HCL 50 MG PO CAPS
50.0000 mg | ORAL_CAPSULE | Freq: Every day | ORAL | Status: DC
  Administered 2019-01-15: 50 mg via ORAL
  Filled 2019-01-15 (×2): qty 1

## 2019-01-15 MED ORDER — ISOSORBIDE MONONITRATE ER 60 MG PO TB24
60.0000 mg | ORAL_TABLET | Freq: Every day | ORAL | Status: DC
  Administered 2019-01-16: 60 mg via ORAL
  Filled 2019-01-15: qty 1

## 2019-01-15 MED ORDER — SODIUM CHLORIDE 0.9 % WEIGHT BASED INFUSION: 1.0000 mL/kg/h | INTRAVENOUS | Status: DC

## 2019-01-15 MED ORDER — MOMETASONE FURO-FORMOTEROL FUM 200-5 MCG/ACT IN AERO
2.0000 | INHALATION_SPRAY | Freq: Two times a day (BID) | RESPIRATORY_TRACT | Status: DC
  Administered 2019-01-15 – 2019-01-16 (×2): 2 via RESPIRATORY_TRACT
  Filled 2019-01-15: qty 8.8

## 2019-01-15 MED ORDER — ONDANSETRON HCL 4 MG/2ML IJ SOLN: 4.0000 mg | Freq: Four times a day (QID) | INTRAMUSCULAR | Status: DC | PRN

## 2019-01-15 MED ORDER — MIDAZOLAM HCL 2 MG/2ML IJ SOLN
INTRAMUSCULAR | Status: DC | PRN
  Administered 2019-01-15 (×2): 1 mg via INTRAVENOUS

## 2019-01-15 MED ORDER — ASPIRIN 81 MG PO CHEW: 81.0000 mg | CHEWABLE_TABLET | ORAL | Status: DC

## 2019-01-15 MED ORDER — SODIUM CHLORIDE 0.9 % WEIGHT BASED INFUSION
3.0000 mL/kg/h | INTRAVENOUS | Status: DC
  Administered 2019-01-15: 3 mL/kg/h via INTRAVENOUS

## 2019-01-15 MED ORDER — NITROGLYCERIN 1 MG/10 ML FOR IR/CATH LAB
INTRA_ARTERIAL | Status: AC
  Filled 2019-01-15: qty 10

## 2019-01-15 MED ORDER — TOPIRAMATE 100 MG PO TABS
100.0000 mg | ORAL_TABLET | Freq: Every day | ORAL | Status: DC
  Administered 2019-01-15 – 2019-01-16 (×2): 100 mg via ORAL
  Filled 2019-01-15 (×2): qty 1

## 2019-01-15 MED ORDER — NITROGLYCERIN 0.4 MG SL SUBL
0.4000 mg | SUBLINGUAL_TABLET | SUBLINGUAL | Status: DC | PRN
  Administered 2019-01-15: 0.4 mg via SUBLINGUAL
  Filled 2019-01-15: qty 1

## 2019-01-15 MED ORDER — CLOPIDOGREL BISULFATE 75 MG PO TABS
ORAL_TABLET | ORAL | Status: DC | PRN
  Administered 2019-01-15: 600 mg via ORAL

## 2019-01-15 MED ORDER — SODIUM CHLORIDE 0.9 % IV SOLN: 250.0000 mL | INTRAVENOUS | Status: DC | PRN

## 2019-01-15 MED ORDER — ENOXAPARIN SODIUM 40 MG/0.4ML ~~LOC~~ SOLN
40.0000 mg | SUBCUTANEOUS | Status: DC
  Administered 2019-01-16: 40 mg via SUBCUTANEOUS
  Filled 2019-01-15: qty 0.4

## 2019-01-15 MED ORDER — HEPARIN (PORCINE) IN NACL 2000-0.9 UNIT/L-% IV SOLN
INTRAVENOUS | Status: DC | PRN
  Administered 2019-01-15: 500 mL

## 2019-01-15 MED ORDER — FENTANYL CITRATE (PF) 100 MCG/2ML IJ SOLN
INTRAMUSCULAR | Status: AC
  Filled 2019-01-15: qty 2

## 2019-01-15 MED ORDER — CLOPIDOGREL BISULFATE 75 MG PO TABS
ORAL_TABLET | ORAL | Status: AC
  Filled 2019-01-15: qty 2

## 2019-01-15 MED ORDER — RISPERIDONE 1 MG PO TABS
1.0000 mg | ORAL_TABLET | Freq: Every day | ORAL | Status: DC
  Administered 2019-01-15: 1 mg via ORAL
  Filled 2019-01-15 (×2): qty 1

## 2019-01-15 MED ORDER — IOHEXOL 300 MG/ML  SOLN
INTRAMUSCULAR | Status: DC | PRN
  Administered 2019-01-15: 10:00:00 210 mL

## 2019-01-15 MED ORDER — HEPARIN (PORCINE) IN NACL 1000-0.9 UT/500ML-% IV SOLN
INTRAVENOUS | Status: DC | PRN
  Administered 2019-01-15: 500 mL

## 2019-01-15 MED ORDER — LISINOPRIL 10 MG PO TABS
20.0000 mg | ORAL_TABLET | Freq: Two times a day (BID) | ORAL | Status: DC
  Administered 2019-01-15 – 2019-01-16 (×2): 20 mg via ORAL
  Filled 2019-01-15 (×2): qty 2

## 2019-01-15 MED ORDER — CARVEDILOL 12.5 MG PO TABS
12.5000 mg | ORAL_TABLET | Freq: Two times a day (BID) | ORAL | Status: DC
  Administered 2019-01-15 – 2019-01-16 (×3): 12.5 mg via ORAL
  Filled 2019-01-15 (×3): qty 1

## 2019-01-15 MED ORDER — CHLORHEXIDINE GLUCONATE CLOTH 2 % EX PADS
6.0000 | MEDICATED_PAD | Freq: Every day | CUTANEOUS | Status: DC
  Administered 2019-01-15 – 2019-01-16 (×2): 6 via TOPICAL

## 2019-01-15 MED ORDER — HEPARIN SODIUM (PORCINE) 1000 UNIT/ML IJ SOLN
INTRAMUSCULAR | Status: DC | PRN
  Administered 2019-01-15: 3000 [IU] via INTRAVENOUS
  Administered 2019-01-15: 5000 [IU] via INTRAVENOUS
  Administered 2019-01-15: 4000 [IU] via INTRAVENOUS
  Administered 2019-01-15 (×2): 2000 [IU] via INTRAVENOUS
  Administered 2019-01-15: 5000 [IU] via INTRAVENOUS

## 2019-01-15 MED ORDER — TAMSULOSIN HCL 0.4 MG PO CAPS
0.4000 mg | ORAL_CAPSULE | Freq: Every day | ORAL | Status: DC
  Administered 2019-01-15: 0.4 mg via ORAL
  Filled 2019-01-15: qty 1

## 2019-01-15 MED ORDER — SODIUM CHLORIDE 0.9 % IV SOLN
INTRAVENOUS | Status: AC
  Administered 2019-01-15: 15:00:00 via INTRAVENOUS

## 2019-01-15 MED ORDER — ACETAMINOPHEN 325 MG PO TABS: 650.0000 mg | ORAL_TABLET | ORAL | Status: DC | PRN

## 2019-01-15 MED ORDER — SODIUM CHLORIDE 0.9% FLUSH: 3.0000 mL | INTRAVENOUS | Status: DC | PRN

## 2019-01-15 MED ORDER — HYDRALAZINE HCL 50 MG PO TABS
50.0000 mg | ORAL_TABLET | Freq: Three times a day (TID) | ORAL | Status: DC
  Administered 2019-01-15 – 2019-01-16 (×3): 50 mg via ORAL
  Filled 2019-01-15 (×3): qty 1

## 2019-01-15 MED ORDER — LORAZEPAM 2 MG PO TABS
2.0000 mg | ORAL_TABLET | Freq: Every day | ORAL | Status: DC
  Administered 2019-01-15: 2 mg via ORAL
  Filled 2019-01-15: qty 1

## 2019-01-15 MED ORDER — SODIUM CHLORIDE 0.9% FLUSH
3.0000 mL | Freq: Two times a day (BID) | INTRAVENOUS | Status: DC
  Administered 2019-01-16: 3 mL via INTRAVENOUS

## 2019-01-15 MED ORDER — BUPROPION HCL ER (XL) 300 MG PO TB24
300.0000 mg | ORAL_TABLET | Freq: Every day | ORAL | Status: DC
  Administered 2019-01-15 – 2019-01-16 (×2): 300 mg via ORAL
  Filled 2019-01-15 (×2): qty 1

## 2019-01-15 MED ORDER — ATORVASTATIN CALCIUM 20 MG PO TABS
40.0000 mg | ORAL_TABLET | Freq: Every day | ORAL | Status: DC
  Administered 2019-01-15: 40 mg via ORAL
  Filled 2019-01-15: qty 2

## 2019-01-15 MED ORDER — NITROGLYCERIN 1 MG/10 ML FOR IR/CATH LAB
INTRA_ARTERIAL | Status: DC | PRN
  Administered 2019-01-15 (×3): 200 ug via INTRACORONARY

## 2019-01-15 MED ORDER — IPRATROPIUM-ALBUTEROL 0.5-2.5 (3) MG/3ML IN SOLN
3.0000 mL | Freq: Every day | RESPIRATORY_TRACT | Status: DC
  Administered 2019-01-16: 3 mL via RESPIRATORY_TRACT
  Filled 2019-01-15: qty 3

## 2019-01-15 MED ORDER — FENTANYL CITRATE (PF) 100 MCG/2ML IJ SOLN
INTRAMUSCULAR | Status: DC | PRN
  Administered 2019-01-15 (×4): 25 ug via INTRAVENOUS

## 2019-01-15 MED ORDER — MIDAZOLAM HCL 2 MG/2ML IJ SOLN
INTRAMUSCULAR | Status: AC
  Filled 2019-01-15: qty 2

## 2019-01-15 MED ORDER — IPRATROPIUM-ALBUTEROL 0.5-2.5 (3) MG/3ML IN SOLN: 3.0000 mL | Freq: Four times a day (QID) | RESPIRATORY_TRACT | Status: DC

## 2019-01-15 MED ORDER — ASPIRIN EC 81 MG PO TBEC
81.0000 mg | DELAYED_RELEASE_TABLET | Freq: Every day | ORAL | Status: DC
  Administered 2019-01-16: 81 mg via ORAL
  Filled 2019-01-15: qty 1

## 2019-01-15 MED ORDER — HYDRALAZINE HCL 20 MG/ML IJ SOLN: 10.0000 mg | INTRAMUSCULAR | Status: AC | PRN

## 2019-01-15 MED ORDER — HEPARIN (PORCINE) IN NACL 1000-0.9 UT/500ML-% IV SOLN
INTRAVENOUS | Status: AC
  Filled 2019-01-15: qty 1000

## 2019-01-15 MED ORDER — LABETALOL HCL 5 MG/ML IV SOLN: 10.0000 mg | INTRAVENOUS | Status: AC | PRN

## 2019-01-15 MED ORDER — PANTOPRAZOLE SODIUM 40 MG PO TBEC
40.0000 mg | DELAYED_RELEASE_TABLET | Freq: Every day | ORAL | Status: DC
  Administered 2019-01-16: 40 mg via ORAL
  Filled 2019-01-15: qty 1

## 2019-01-15 SURGICAL SUPPLY — 23 items
BALLN EUPHORA RX 1.5X10 (BALLOONS) ×4
BALLN EUPHORA RX 2.0X12 (BALLOONS) ×4
BALLN ~~LOC~~ TREK RX 2.5X12 (BALLOONS) ×4
BALLOON EUPHORA RX 1.5X10 (BALLOONS) IMPLANT
BALLOON EUPHORA RX 2.0X12 (BALLOONS) IMPLANT
BALLOON ~~LOC~~ TREK RX 2.5X12 (BALLOONS) IMPLANT
CATH INFINITI 5 FR IM (CATHETERS) ×2 IMPLANT
CATH INFINITI 5 FR MPA2 (CATHETERS) ×2 IMPLANT
CATH INFINITI 5FR JL4 (CATHETERS) ×2 IMPLANT
CATH INFINITI JR4 5F (CATHETERS) ×2 IMPLANT
CATH LAUNCHER 6FR EBU 3.75 (CATHETERS) ×2 IMPLANT
DEVICE INFLAT 30 PLUS (MISCELLANEOUS) ×2 IMPLANT
DEVICE RAD TR BAND REGULAR (VASCULAR PRODUCTS) ×2 IMPLANT
GLIDESHEATH SLEND SS 6F .021 (SHEATH) ×2 IMPLANT
KIT MANI 3VAL PERCEP (MISCELLANEOUS) ×4 IMPLANT
PACK CARDIAC CATH (CUSTOM PROCEDURE TRAY) ×4 IMPLANT
STENT SYNERGY DES 2.25X16 (Permanent Stent) ×2 IMPLANT
STENT SYNERGY DES 2.25X20 (Permanent Stent) ×2 IMPLANT
STENT SYNERGY DES 2.50X8 (Permanent Stent) ×2 IMPLANT
WIRE ASAHI PROWATER 180CM (WIRE) ×2 IMPLANT
WIRE HITORQ VERSACORE ST 145CM (WIRE) ×2 IMPLANT
WIRE ROSEN-J .035X260CM (WIRE) ×2 IMPLANT
WIRE RUNTHROUGH .014X180CM (WIRE) ×2 IMPLANT

## 2019-01-15 NOTE — Plan of Care (Signed)
  Problem: Education: Goal: Understanding of CV disease, CV risk reduction, and recovery process will improve Outcome: Progressing

## 2019-01-15 NOTE — Progress Notes (Addendum)
While doing admission profile, patient stated he had a foley catheter that was inserted a couple weeks ago for urinary retention. Last night it was leaking so he "cut it off". I asked how he did that because there is a balloon that needs to be deflated for it to come out and he said " I just cut it off and let it drain, the whole thing came out". He stated he did void this morning in the cath lab. Dr. Saunders Revel notified, ordered to bladder scan patient.   Update revealed 287 ml. Dr. Saunders Revel notified.   Update 1715: Sublingual nitro given per Dr. Darnelle Bos order. Patient had chest discomfort 2/10. EKG also done and put in chart. Orders from Dr. Saunders Revel to put in foley catheter for urinary retention. Foley put in, patient tolerated well. Chest discomfort no longer there after sublingual nitro given. Will continue to monitor.

## 2019-01-15 NOTE — Brief Op Note (Signed)
BRIEF CARDIAC CATHETERIZATION NOTE  01/15/2019  10:10 AM  PATIENT:  Roger Pruitt  68 y.o. male  PRE-OPERATIVE DIAGNOSIS:  Unstable angina  POST-OPERATIVE DIAGNOSIS:  Unstable angina  PROCEDURE:  Procedure(s): LEFT HEART CATH AND CORS/GRAFTS ANGIOGRAPHY (Left) CORONARY STENT INTERVENTION (N/A)  SURGEON:  Surgeon(s) and Role:    Nelva Bush, MD - Primary  FINDINGS: 1. Severe multivessel CAD, including CTO of mid LAD, sequential 80-90% ostial, proximal, and mid D1 stenoses, CTO of D2, diffusely diseased LCx with distal occlusion, and diffusely diseased RCA with up to 60-70% stenosis at the crux. 2. Patent LIMA-LAD with LAD beyond the anastomosis demonstrating diffuse disease of up to 80%. 3. Patent SVG Y-graft to D2 and OM. 4. Chronically occluded SVG-rPDA. 5. Normal LVEF with mildly elevated LVEDP. 6. Challenging but successful PCI to ostial through mid D1 using overlapping Synergy 2.5 x 8 mm (proximal), 2.25 x 20 mm (mid), and 2.25 x 16 mm (distal) drug eluting stents with 0% residual stenosis and TIMI-3 flow.  RECOMMENDATIONS: 1. Overnight extended recovery. 2. DAPT with ASA and clopidogrel for at least 12 months, ideally longer given disease burden a long segment of overlapping stents in D1. 3. Aggressive secondary prevention.  Nelva Bush, MD [facility] Pager: [phone]

## 2019-01-15 NOTE — Progress Notes (Signed)
Cardiovascular and Pulmonary Nurse Navigator Note:    Patient presented for outpatient Cardiac Catheterization today with dx of unstable angina.    BRIEF CARDIAC CATHETERIZATION NOTE  01/15/2019  10:10 AM  PATIENT:  Roger Pruitt  68 y.o. male  PRE-OPERATIVE DIAGNOSIS:  Unstable angina  POST-OPERATIVE DIAGNOSIS:  Unstable angina  PROCEDURE:  Procedure(s): LEFT HEART CATH AND CORS/GRAFTS ANGIOGRAPHY (Left) CORONARY STENT INTERVENTION (N/A)  SURGEON:  Surgeon(s) and Role:    Nelva Bush, MD - Primary  FINDINGS: 1. Severe multivessel CAD, including CTO of mid LAD, sequential 80-90% ostial, proximal, and mid D1 stenoses, CTO of D2, diffusely diseased LCx with distal occlusion, and diffusely diseased RCA with up to 60-70% stenosis at the crux. 2. Patent LIMA-LAD with LAD beyond the anastomosis demonstrating diffuse disease of up to 80%. 3. Patent SVG Y-graft to D2 and OM. 4. Chronically occluded SVG-rPDA. 5. Normal LVEF with mildly elevated LVEDP. 6. Challenging but successful PCI to ostial through mid D1 using overlapping Synergy 2.5 x 8 mm (proximal), 2.25 x 20 mm (mid), and 2.25 x 16 mm (distal) drug eluting stents with 0% residual stenosis and TIMI-3 flow  EDUCATION:   CAD is not a new diagnosis for this patient.  Patient has had CABG in the past.   Explained to patient he will be given a stent card.  Explained the purpose of the stent card. Instructed patient to keep stent card in his wallet.  ? Discussed modifiable risk factors including controlling blood pressure, cholesterol, and blood sugar; following heart healthy diet; maintaining healthy weight; exercise; and smoking cessation, if applicable.   ? Discussed cardiac medications including rationale for taking, mechanisms of action, and side effects. Stressed the importance of taking medications as prescribed.  ? Discussed emergency plan for heart attack symptoms. Patient verbalized understanding of need to  call 911 and not to drive himself to ER if having cardiac symptoms / chest pain.     ? Diet of low sodium, low fat, low cholesterol diet. Patient is ordered a Heart Room Service diet here.   ? Smoking Cessation - Patient is a FORMER smoker. ? Exercise - Explained to patient his cardiologist has referred him to outpatient Cardiac Rehab. Patient stated, "I will not be able to participate because I have transportation issues...no reliable transportation."  This RN then asked patient if we ([facility]) provided transporation for him, would he participate?"  Patient stated, "I am not interested in participating."   ?  Patient appreciative of the information.  ? Roanna Epley, RN, BSN, [place]  [facility]  Cardiovascular & Pulmonary Nurse Navigator  Direct Line: [phone]  Department Phone #: [phone] Fax: [phone]  Email Address: [email or web address]

## 2019-01-15 NOTE — Interval H&P Note (Signed)
History and Physical Interval Note:  01/15/2019 7:58 AM  Roger Pruitt  has presented today for cardiac catheterization, with the diagnosis of unstable angina.  The various methods of treatment have been discussed with the patient and family. After consideration of risks, benefits and other options for treatment, the patient has consented to  Procedure(s): LEFT HEART CATH AND CORS/GRAFTS ANGIOGRAPHY (Left) as a surgical intervention.  The patient's history has been reviewed, patient examined, no change in status, stable for surgery.  I have reviewed the patient's chart and labs.  Questions were answered to the patient's satisfaction.    Cath Lab Visit (complete for each Cath Lab visit)  Clinical Evaluation Leading to the Procedure:   ACS: No.  Non-ACS:    Anginal Classification: CCS IV  Anti-ischemic medical therapy: Maximal Therapy (2 or more classes of medications)  Non-Invasive Test Results: Intermediate-risk stress test findings: cardiac mortality 1-3%/year  Prior CABG: Previous CABG  Roger Pruitt

## 2019-01-15 NOTE — Progress Notes (Signed)
[facility]  Date: 01/15/19 Time: 5:14 PM  Subjective: I was contacted by the patient's nurse regarding patient's difficulty voiding and recent self-discontinuation of bladder catheter.  The patient had a catheter placed by urology earlier this month for urinary retention.  He reports noticing some leakage from the catheter connection yesterday.  He was able to deflate the balloon and remove the catheter in its entirety on his own.  He voided this morning but experienced urinary retention post PCI.  In and out catheterization was performed.  He subsequently has been unable to void, with bladder scan showing 200 to 300 mL of fluid in the bladder.  Roger Pruitt also reports having return of his chronic chest pain about an hour ago.  It had resolved completely after PCI but previously had been present for about 2 months.  He currently rates it 3/10 in intensity.  Objective: Regular rate and rhythm without murmurs.  Lungs are clear.  Mild swelling at left radial arteriotomy site and adjacent bruising noted.  2+ left radial pulse.  Assessment/plan: Patient has been doing well status post PCI to D1 but has experienced recurrent chest pain.  Pain is mild and similar to what he experienced before the intervention.  Some of it could certainly be due to small vessel disease and worsening blood pressure this afternoon.  We will repeat an EKG, though assessment for ischemic changes will be limited due to baseline LBBB.  Sublingual nitroglycerin to be given.  Based on response, further escalation of blood pressure and antianginal therapy will need to be considered.  In regard to his urinary retention, we will place a Foley catheter and discuss long-term options with urology in the morning.  Roger Bush, MD [facility] Pager: [phone]

## 2019-01-16 ENCOUNTER — Other Ambulatory Visit: Payer: Medicare Other

## 2019-01-16 ENCOUNTER — Telehealth: Payer: Self-pay

## 2019-01-16 ENCOUNTER — Encounter: Payer: Self-pay | Admitting: Internal Medicine

## 2019-01-16 DIAGNOSIS — I25110 Atherosclerotic heart disease of native coronary artery with unstable angina pectoris: Secondary | ICD-10-CM | POA: Diagnosis not present

## 2019-01-16 DIAGNOSIS — I200 Unstable angina: Secondary | ICD-10-CM | POA: Diagnosis not present

## 2019-01-16 DIAGNOSIS — Z955 Presence of coronary angioplasty implant and graft: Secondary | ICD-10-CM | POA: Diagnosis not present

## 2019-01-16 DIAGNOSIS — I110 Hypertensive heart disease with heart failure: Secondary | ICD-10-CM | POA: Diagnosis not present

## 2019-01-16 DIAGNOSIS — E785 Hyperlipidemia, unspecified: Secondary | ICD-10-CM | POA: Diagnosis not present

## 2019-01-16 DIAGNOSIS — I5032 Chronic diastolic (congestive) heart failure: Secondary | ICD-10-CM | POA: Diagnosis not present

## 2019-01-16 DIAGNOSIS — E119 Type 2 diabetes mellitus without complications: Secondary | ICD-10-CM | POA: Diagnosis not present

## 2019-01-16 LAB — CBC
HCT: 37.1 % — ABNORMAL LOW (ref 39.0–52.0)
Hemoglobin: 11.8 g/dL — ABNORMAL LOW (ref 13.0–17.0)
MCH: 28.7 pg (ref 26.0–34.0)
MCHC: 31.8 g/dL (ref 30.0–36.0)
MCV: 90.3 fL (ref 80.0–100.0)
Platelets: 185 10*3/uL (ref 150–400)
RBC: 4.11 MIL/uL — ABNORMAL LOW (ref 4.22–5.81)
RDW: 14.1 % (ref 11.5–15.5)
WBC: 9.3 10*3/uL (ref 4.0–10.5)
nRBC: 0 % (ref 0.0–0.2)

## 2019-01-16 LAB — BASIC METABOLIC PANEL
Anion gap: 7 (ref 5–15)
BUN: 14 mg/dL (ref 8–23)
CO2: 21 mmol/L — ABNORMAL LOW (ref 22–32)
Calcium: 8.8 mg/dL — ABNORMAL LOW (ref 8.9–10.3)
Chloride: 113 mmol/L — ABNORMAL HIGH (ref 98–111)
Creatinine, Ser: 0.86 mg/dL (ref 0.61–1.24)
GFR calc Af Amer: >60 mL/min (ref >60)
GFR calc non Af Amer: >60 mL/min (ref >60)
Glucose, Bld: 89 mg/dL (ref 70–99)
Potassium: 3.8 mmol/L (ref 3.5–5.1)
Sodium: 141 mmol/L (ref 135–145)

## 2019-01-16 LAB — GLUCOSE, CAPILLARY
Glucose-Capillary: 77 mg/dL (ref 70–99)
Glucose-Capillary: 92 mg/dL (ref 70–99)

## 2019-01-16 MED ORDER — CLOPIDOGREL BISULFATE 75 MG PO TABS: 75.0000 mg | ORAL_TABLET | Freq: Every day | ORAL | 3 refills | Status: AC

## 2019-01-16 NOTE — Discharge Summary (Signed)
Discharge Summary    Patient ID: Roger Pruitt MRN: [id number]; DOB: 1951-02-22  Admit date: 01/15/2019 Discharge date: 01/16/2019  Primary Care Provider: Casilda Carls, MD  Primary Cardiologist: Kate Sable, MD  Primary Electrophysiologist:  None   Discharge Diagnoses    Principal Problem:   Unstable angina [facility]) Active Problems:   CAD (coronary artery disease), native coronary artery   S/P coronary artery stent placement   Hyperlipidemia   Hypertension   Type II diabetes mellitus (HCC)   S/P CABG x 4   Allergies Allergies  Allergen Reactions   Morphine And Related Nausea And Vomiting   Synalgos-Dc [Aspirin-Caff-Dihydrocodeine] Itching and Other (See Comments)    Reaction:  Stinging    Synchlor A [Chlorpheniramine] Rash and Other (See Comments)    Patient doesn't recall this allergy.    Diagnostic Studies/Procedures    LHC 01/15/2019 Conclusions: 1. Severe multivessel coronary artery disease, including chronic total occlusion of mid LAD, sequential 80-90% ostial, proximal, and mid D1 stenoses, chronic total occlusion of D2, diffusely diseased LCx with distal occlusion, and diffusely diseased RCA with up to 60-70% stenosis at the crux. 2. Patent LIMA-LAD with diffuse disease in the distal LAD beyond the LIMA anastomosis with up to 80% stenosis. 3. Patent SVG Y-graft to D2 and OM.  Limb to OM 2 demonstrates 40% stenosis at the proximal anastomosis. 4. Chronically occluded SVG-rPDA. 5. Normal left ventricular ejection fraction with mildly elevated left ventricular filling pressure. 6. Challenging but successful PCI to ostial through mid D1 using overlapping Synergy 2.5 x 8 mm (proximal), 2.25 x 20 mm (mid), and 2.25 x 16 mm (distal) drug eluting stents with 0% residual stenosis and TIMI-3 flow. Recommendations: 1. Overnight extended recovery. 2. Dual antiplatelet therapy with aspirin and clopidogrel for at least 12 months, ideally longer given disease burden  a long segment of overlapping stents in D1. 3. Aggressive secondary prevention and antianginal medical therapy for treatment of residual disease. Nelva Bush, MD [facility] Pager: [phone]  Coronary Diagrams  Diagnostic Dominance: Right  Intervention    _____________   History of Present Illness     68 year old male with complex past medical history as above and including CAD s/p prior circumflex stenting and subsequent four-vessel bypass in 2017, poorly controlled hypertension, remote tobacco abuse, hyperlipidemia, diabetes, depression, and anxiety.  He recently reestablished care in our cardiology office after being followed at [facility] clinic for approximately 3 years.  When seen in September in clinic to reestablish care, he reported a 25-month history of progressive exertional chest pain and dyspnea, as well as occasional dizziness and unsteady gait.  Stress testing was undertaken and ruled moderate to high risk with mid to distal inferior lateral ischemia.  He has had several hospitalizations related to recurrent urinary tract infections and hypertension. He is followed by urology for urinary retention and requires a home foley catheter.  He was seen at [facility] on January 04, 2019 due to UTI with hematuria and altered mental status, felt to be secondary to encephalopathy from UTI.  At his last follow-up 01/08/2019, he reported that he continued to have almost daily exertional chest tightness, associated with dyspnea and lasting only minutes but up to 1 hour and resolving with rest.  This required him to use his nitroglycerin.  He was interested in pursuing diagnostic cardiac catheterizations with risks and benefits reviewed and patient scheduled for outpatient diagnostic cardiac cath on 01/15/2019.  Follow-up labs were obtained and reviewed.  He is continued  on ASA, beta-blocker, ACE, and statin.  His Imdur was uptitrated to 60 mg daily, given his ongoing  symptoms of hypertension with blood pressure uncontrolled at 180/90 in office.  His hydralazine was uptitrated to 50 mg 3 times daily.  Hospital Course     Consultants: Urology was consulted, as patient self - removed his foley catheter after his catheterization with need for clarification from urology regarding any recommendations before and after discharge regarding management.  -----------  On 01/15/2019, he underwent diagnostic cardiac catheterization as below that showed severe multivessel CAD, including chronic total occlusion of the mLAD, sequential 80-90% ostial, proximal, and mid D1 stenoses, chronic total occlusion of the D2, diffusely diseased LCx with distal occlusion, and diffusely diseased RCA with up to 60-70% stenosis at the crux. He had a patent LIMA-LAD with diffuse dz in the dLAD, beyond the LIMA anastomosis with up to 80% stenosis. He had a pateitn SVG Y-graft D2 to OM. Limb to OM2 demonstrated 40% stenosis at the proximal anastomosis. Chronically occluded SVG-rPDA was noted. Also noted was normal LVEF with mildly elevated LV filling pressure. A challenging but successful PCI to ostial through mid D1 was performed using overlapping Synergy 2.5x43mm (proximal), 2.25x74mm (mid), and 2.25 x13mm (distal) DES with 0-% residual stenosis and TIMI-3 flow.    Recommendations were for overnight extended recovery. Overnight, cardiology was contacted by the patient's nurse regarding his difficulty voiding and recent self-discontinuation of his bladder catheter, placed by urology earlier in the month for urinary retention.  He reportedly noticed some leakage from the catheter on 10/19.  He was able to deflate the balloon and remove the catheter entirely on its own.  He voided the morning of 10/20; however, he experienced urinary retention post PCI, and in and out catheterization was performed.  He subsequently had been unable to void with bladder scan showing 200 to 300 mL of fluid in the bladder.  It  was also noted that he had experienced return of his chronic chest pain at approximately 4 PM after resolving after his PCI and was rated 3/10 in intensity.  It was described as mild and similar to what he had been experiencing for approximately 2 months prior to the intervention.  It was noted that some of the chest pain could be due to small vessel disease and worsening blood pressure that afternoon.  He was examined with notes indicating no acute findings on exam other than mild swelling of the left radial arteriotomy site and adjacent bruising with 2+ left radial pulse. EKG was repeated and without acute changes, though it was noted assessment for ischemic changes was limited due to baseline left bundle branch block.  As needed sublingual nitroglycerin was continued.  It was noted further escalation of blood pressure and antianginal therapy would need to be considered the following day.  In regards to his urinary retention, a Foley catheter was placed with recommendation that urology be contacted the following morning.  Urology was contacted this morning (01/16/19) and before discharge regarding the patient's Foley catheter and urinary retention. Recommendation was to have the patient continue his foley catheter and follow-up in the urology office regarding rescheduling his cystoscopy as an outpatient. It was conveyed that no further evaluation was needed from a urology standpoint during his current admission. Scheduling was notified to arrange outpatient urology follow-up. Per patient request, his foley bag was changed to a leg bag, as indicated in his documentation and prior to discharge.      Today, 01/16/2019, the  patient has been examined by cardiology (as indicated in attending caridiology MD note for today) with labs, vitals, and medications reviewed and patient deemed stable for discharge.  He denies any chest pain, palpitations, or racing heart rate.  No shortness of breath reported.  His left  radial cardiac catheterization site has been examined with small area of ecchymosis noted, 2+ left radial pulse, and patient denying any TTP or signs of infection and with bandage clean, dry, and intact.  His medications have been reviewed in detail with his Plavix called into the pharmacy indicated by the patient.  He will continue on DAPT with ASA and clopidogrel for at least 12 months, ideally longer given the dz burden with a long segment of overlapping stents in D1. Aggressive secondary prevention with statin and antianginal medical therapy was recommended for treatment of residual disease. He was discharged on ASA 81 mg daily, atorvastatin 40 mg daily, Coreg 12.5 mg twice daily, clopidogrel 75 mg daily, hydralazine 50 mg 3 times daily, Imdur 60 mg daily, lisinopril 20 mg twice daily, and PPI.    Plan for follow-up with cardiology as an outpatient was reviewed with patient with our office notified to schedule his outpatient follow-up within 1-2 weeks of discharge.  Per patient's primary cardiologist, if the patient continues to have chest pain after discharge, escalation of antianginal therapy could be performed at that time and as an outpatient, such as increasing his Coreg to 25 mg twice daily +/- the addition of Ranexa. Consider also optimization of BP control at that time.  _____________  Discharge Vitals Blood pressure (!) 145/84, pulse 60, temperature 97.7 F (36.5 C), temperature source Oral, resp. rate 18, height 6\' 3"  (1.905 m), weight 96.3 kg, SpO2 99 %.  Filed Weights   01/15/19 0641 01/15/19 1500 01/16/19 0541  Weight: 96.5 kg 96.2 kg 96.3 kg    Labs & Radiologic Studies    CBC Recent Labs    01/16/19 0555  WBC 9.3  HGB 11.8*  HCT 37.1*  MCV 90.3  PLT 123XX123   Basic Metabolic Panel Recent Labs    01/16/19 0555  NA 141  K 3.8  CL 113*  CO2 21*  GLUCOSE 89  BUN 14  CREATININE 0.86  CALCIUM 8.8*   Liver Function Tests No results for input(s): AST, ALT, ALKPHOS,  BILITOT, PROT, ALBUMIN in the last 72 hours. No results for input(s): LIPASE, AMYLASE in the last 72 hours. Cardiac Enzymes No results for input(s): CKTOTAL, CKMB, CKMBINDEX, TROPONINI in the last 72 hours. BNP Invalid input(s): POCBNP D-Dimer No results for input(s): DDIMER in the last 72 hours. Hemoglobin A1C No results for input(s): HGBA1C in the last 72 hours. Fasting Lipid Panel No results for input(s): CHOL, HDL, LDLCALC, TRIG, CHOLHDL, LDLDIRECT in the last 72 hours. Thyroid Function Tests No results for input(s): TSH, T4TOTAL, T3FREE, THYROIDAB in the last 72 hours.  Invalid input(s): FREET3 _____________  Ct Head Wo Contrast  Result Date: 01/02/2019 CLINICAL DATA:  Altered level of consciousness. EXAM: CT HEAD WITHOUT CONTRAST TECHNIQUE: Contiguous axial images were obtained from the base of the skull through the vertex without intravenous contrast. COMPARISON:  12/10/2018 FINDINGS: Brain: There is no evidence for acute hemorrhage, hydrocephalus, mass lesion, or abnormal extra-axial fluid collection. No definite CT evidence for acute infarction. Diffuse loss of parenchymal volume is consistent with atrophy. Patchy low attenuation in the deep hemispheric and periventricular white matter is nonspecific, but likely reflects chronic microvascular ischemic demyelination. Small lacunar infarcts are seen  in the basal ganglia bilaterally. Vascular: No hyperdense vessel or unexpected calcification. Skull: No evidence for fracture. No worrisome lytic or sclerotic lesion. Sinuses/Orbits: The visualized paranasal sinuses and mastoid air cells are clear. Visualized portions of the globes and intraorbital fat are unremarkable. Other: None. IMPRESSION: Stable.  No acute intracranial abnormality. Atrophy with small vessel white matter disease in bilateral lacunar infarcts in the basal ganglia. Electronically Signed   By: Misty Stanley M.D.   On: 01/02/2019 13:50   Nm Myocar Multi W/spect W/wall Motion  / Ef  Result Date: 12/28/2018 Pharmacological myocardial perfusion imaging study with moderate-sized region of ischemia in the mid to distal inferolateral wall Predominantly fixed defect in the basal inferior lateral region Hypokinesis of the basal inferolateral regio, n EF estimated at 55% No EKG changes concerning for ischemia at peak stress or in recovery. Resting EKG with T wave abnormality V5, V6, lead III and aVF concerning for inferolateral ischemia CT attenuation corrected images with severe three-vessel coronary calcification, mild aortic atherosclerosis Moderate to high risk scan Signed, Esmond Plants, MD, Ph.D [facility]   Disposition    General:  Well nourished, well developed, in no acute distress HEENT: normal Lymph: no adenopathy Neck: no JVD Vascular: No carotid bruits; radial pulses 2+ bilaterally Cardiac:  normal S1, S2; RRR; no murmur.  Left radial catheterization site examined with small area of ecchymosis noted.  Bandage clean/dry/and intact.  No signs of infection. Lungs:  clear to auscultation bilaterally, no wheezing, rhonchi or rales  Abd: soft, nontender, no hepatomegaly  Ext: no b/l lower extremity edema Musculoskeletal:  Deformity noted of right hand and fingers Skin: warm and dry  Neuro:  no focal abnormalities noted Psych:  Normal to somewhat flat affect   Pt is being discharged home today in good condition.  Follow-up Plans & Appointments    Follow-up Information    [facility] Follow up.   Specialty: Cardiac Rehabilitation Why: Your Cardiologist has referred you to outpatient Cardiac Rehab at [facility].  Per your discussion with Roanna Epley, RN, Cardiac Nurse Navigator, you have declined participation in Cardiac Rehab.   Contact information: [place] [id number] [address]       Kate Sable, MD Follow up.   Specialties: Cardiology, Radiology Why: Someone should call from  the cardiology office to arrange this appointment for you within the next 1-2 weeks from the time of discharge. Please call the office if you do not receive a call to arrange this appointment in the next few days. Contact information: [place] [address] [phone]        Nori Riis, PA-C.   Specialties: Urology, Radiology Why: Someone should be in touch to arrange follow-up with urology in the next 1-2 weeks. Please call the urology office if you do not have an appointment arranged within the next few days. Contact information: [place] [address] [phone]          Discharge Instructions    AMB Referral to Cardiac Rehabilitation - Phase II   Complete by: As directed    Diagnosis: Coronary Stents   After initial evaluation and assessments completed: Virtual Based Care may be provided alone or in conjunction with Phase 2 Cardiac Rehab based on patient barriers.: Yes   Call MD for:  difficulty breathing, headache or visual disturbances   Complete by: As directed    Call MD for:  extreme fatigue  Complete by: As directed    Call MD for:  hives   Complete by: As directed    Call MD for:  persistant dizziness or light-headedness   Complete by: As directed    Call MD for:  persistant nausea and vomiting   Complete by: As directed    Call MD for:  redness, tenderness, or signs of infection (pain, swelling, redness, odor or green/yellow discharge around incision site)   Complete by: As directed    Call MD for:  severe uncontrolled pain   Complete by: As directed    Call MD for:  temperature >100.4   Complete by: As directed    Diet - low sodium heart healthy   Complete by: As directed    Increase activity slowly   Complete by: As directed       Discharge Medications   Allergies as of 01/16/2019      Reactions   Morphine And Related Nausea And Vomiting   Synalgos-dc [aspirin-caff-dihydrocodeine]  Itching, Other (See Comments)   Reaction:  Stinging    Synchlor A [chlorpheniramine] Rash, Other (See Comments)   Patient doesn't recall this allergy.      Medication List    STOP taking these medications   cephALEXin 500 MG capsule Commonly known as: KEFLEX     TAKE these medications   acetaminophen 500 MG tablet Commonly known as: TYLENOL Take 1,000 mg by mouth every 6 (six) hours as needed for moderate pain or headache.   Aspirin 81 81 MG EC tablet Generic drug: aspirin Take 81 mg by mouth daily. Notes to patient: Patients taking blood thinners should generally stay away from medicines like ibuprofen, Advil, Motrin, naproxen, and Aleve due to risk of stomach bleeding. You may take Tylenol as directed or talk to your primary doctor about alternatives.   atorvastatin 40 MG tablet Commonly known as: LIPITOR Take 1 tablet (40 mg total) by mouth daily at 6 PM.   buPROPion 300 MG 24 hr tablet Commonly known as: WELLBUTRIN XL Take 300 mg by mouth daily. noon   carvedilol 12.5 MG tablet Commonly known as: COREG Take 12.5 mg by mouth 2 (two) times daily.   clopidogrel 75 MG tablet Commonly known as: PLAVIX Take 1 tablet (75 mg total) by mouth daily with breakfast. Start taking on: January 17, 2019 Notes to patient: Patients taking blood thinners should generally stay away from medicines like ibuprofen, Advil, Motrin, naproxen, and Aleve due to risk of stomach bleeding. You may take Tylenol as directed or talk to your primary doctor about alternatives.   Combivent Respimat 20-100 MCG/ACT Aers respimat Generic drug: Ipratropium-Albuterol INHALE ONE PUFF INTO THE LUNGS EVERY 6 HOURS What changed:   how much to take  how to take this  when to take this  additional instructions   doxepin 25 MG capsule Commonly known as: SINEQUAN Take 50 mg by mouth at bedtime.   fluticasone 50 MCG/ACT nasal spray Commonly known as: FLONASE Place 2 sprays into the nose daily.     gabapentin 300 MG capsule Commonly known as: NEURONTIN Take 300 mg by mouth daily.   hydrALAZINE 50 MG tablet Commonly known as: APRESOLINE Take 1 tablet (50 mg total) by mouth 3 (three) times daily.   isosorbide mononitrate 60 MG 24 hr tablet Commonly known as: IMDUR Take 1 tablet (60 mg total) by mouth daily.   Janumet 50-1000 MG tablet Generic drug: sitaGLIPtin-metformin Take 1 tablet by mouth 2 (two) times daily. Am and pm  lisinopril 20 MG tablet Commonly known as: ZESTRIL Take 20 mg by mouth 2 (two) times daily.   LORazepam 1 MG tablet Commonly known as: ATIVAN Take 1-2 tablets (1-2 mg total) by mouth See admin instructions. Take 1 mg by mouth in the morning and take 2 mg by mouth at bedtime   nitroGLYCERIN 0.4 MG/SPRAY spray Commonly known as: NITROLINGUAL Place 1 spray under the tongue every 5 (five) minutes x 3 doses as needed for chest pain. Notes to patient: If taking your third dose without relief of chest pain, please call 911.   pantoprazole 40 MG tablet Commonly known as: PROTONIX Take 40 mg by mouth daily.   risperiDONE 1 MG tablet Commonly known as: RISPERDAL Take 1 tablet (1 mg total) by mouth at bedtime.   Symbicort 160-4.5 MCG/ACT inhaler Generic drug: budesonide-formoterol INHALE TWO PUFFS INTO THE LUNGS TWICE DAILY   tamsulosin 0.4 MG Caps capsule Commonly known as: FLOMAX Take 1 capsule (0.4 mg total) by mouth daily.   topiramate 100 MG tablet Commonly known as: TOPAMAX Take 100 mg by mouth daily at 12 noon.        No                               Did the patient have a percutaneous coronary intervention (stent / angioplasty)?:  Yes.     Cath/PCI Registry Performance & Quality Measures: 1. Aspirin prescribed? - Yes 2. ADP Receptor Inhibitor (Plavix/Clopidogrel, Brilinta/Ticagrelor or Effient/Prasugrel) prescribed (includes medically managed patients)? - Yes 3. High Intensity Statin (Lipitor 40-80mg  or Crestor 20-40mg ) prescribed? -  Yes 4. For EF <40%, was ACEI/ARB prescribed? - Not Applicable (EF >/= [id number]) 5. For EF <40%, Aldosterone Antagonist (Spironolactone or Eplerenone) prescribed? - Not Applicable (EF >/= [id number]) 6. Cardiac Rehab Phase II ordered (Included Medically managed Patients)? - Yes     Outstanding Labs/Studies   Follow-up with Urology regarding bladder retention and timing of cystoscopy to be scheduled. Follow-up with Cardiology to be scheduled within 1-2 weeks of discharge (office contacted to schedule with patient).  Recommend follow-up BMET and CBC as outpatient to check renal function, electrolytes, and blood counts (given his history of hematuria / UTI with current foley catheter on DAPT).   Per MD, if patient continues to report chest pains after discharge, escalation of antianginal therapy will be performed at follow-up and including increasing Coreg dose to 25 mg twice daily.  Ranexa could also be added to his antianginal regimen.   Duration of Discharge Encounter   Greater than 30 minutes including physician time.  Signed, Arvil Chaco, PA-C 01/16/2019, 6:47 PM

## 2019-01-16 NOTE — Discharge Instructions (Signed)
°  ° °You have received care from [facility] during this hospital stay and we look forward to continuing to provide you with excellent care in our office settings after you've left the hospital.  In order to assure a smoother transition to home following your discharge from the hospital, we will likely have you see one of our nurse practitioners or physician assistants within a few weeks of discharge.  Our advanced practice providers work closely with your physician in order to address all of your heart's needs in a timely manner.  More information about all of our providers may be found here: [email or web address] °  °Please plan to bring all of your prescriptions to your follow-up appointment and don't hesitate to contact us with questions or concerns. ° °[facility] [place] - [phone] °[address] °[address] °[place], [place] [address] °-------------------------------------- ° °CATHETERIZATION: °NO HEAVY LIFTING X 4 WEEKS. °NO SEXUAL ACTIVITY X 4 WEEKS. °NO SOAKING BATHS, HOT TUBS, POOLS, ETC., X 7 DAYS. ° ° °Radial Site Care °Refer to this sheet in the next few weeks. These instructions provide you with information on caring for yourself after your procedure. Your caregiver may also give you more specific instructions. Your treatment has been planned according to current medical practices, but problems sometimes occur. Call your caregiver if you have any problems or questions after your procedure. °HOME CARE INSTRUCTIONS °· You may shower the day after the procedure. Remove the bandage (dressing) and gently wash the site with plain soap and water. Gently pat the site dry.  °· Do not apply powder or lotion to the site.  °· Do not submerge the affected site in water for 3 to 5 days.  °· Inspect the site at least twice daily.  °· Do not flex or bend the affected arm for 24 hours.  °· No lifting over 5 pounds (2.3 kg) for  5 days after your procedure.  °What to expect: °· Any bruising will usually fade within 1 to 2 weeks.  °· Blood that collects in the tissue (hematoma) may be painful to the touch. It should usually decrease in size and tenderness within 1 to 2 weeks.  °SEEK IMMEDIATE MEDICAL CARE IF: °· You have unusual pain at the radial site.  °· You have redness, warmth, swelling, or pain at the radial site.  °· You have drainage (other than a small amount of blood on the dressing).  °· You have chills.  °· You have a fever or persistent symptoms for more than 72 hours.  °· You have a fever and your symptoms suddenly get worse.  °· Your arm becomes pale, cool, tingly, or numb.  °· You have heavy bleeding from the site. Hold pressure on the site.  ° ° °  ° °10 Habits of Highly Healthy People ° °[place] wants to help you get well and stay well.  Live a longer, healthier life by practicing healthy habits every day. ° °1.  Visit your primary care provider regularly. °2.  Make time for family and friends.  Healthy relationships are important. °3.  Take medications as directed by your provider. °4.  Maintain a healthy weight and a trim waistline. °5.  Eat healthy meals and snacks, rich in fruits, vegetables, whole grains, and lean proteins. °6.  Get moving every day - aim for 150 minutes of moderate physical activity each week. °7.  Don't smoke. °8.  Avoid alcohol or drink in moderation. °9.  Manage stress through   meditation or mindful relaxation. °10.  Get seven to nine hours of quality sleep each night. ° °Want more information on healthy habits?  To learn more about these and other healthy habits, visit [email or web address]. ° °

## 2019-01-16 NOTE — Progress Notes (Signed)
Patient given discharge instructions, verbalized understanding. SWOT nurse Beth changed foley bag to leg bag. IV taken out and tele monitor off. Patient going home in stable condition.

## 2019-01-16 NOTE — TOC Transition Note (Signed)
Transition of Care [facility]) - CM/SW Discharge Note   Patient Details  Name: Roger Pruitt MRN: [id number] Date of Birth: Dec 17, 1950  Transition of [facility]) CM/SW Contact:  Ross Ludwig, LCSW Phone Number: 01/16/2019, 2:27 PM   Clinical Narrative:     Patient is currently active with home health through [facility], patient is receiving home health PT, and RN.  Patient states that he is happy with his agency and wants to continue with them.  CSW contacted [facility] to inform them that he is discharging today.  CSW informed home health agency that patient is full code.    Final next level of care: [place] Barriers to Discharge: Barriers Resolved   Patient Goals and CMS Choice Patient states their goals for this hospitalization and ongoing recovery are:: To return back home   Choice offered to / list presented to : Patient  Discharge Placement    Patient is planning to return back home with home health.                     Discharge Plan and Services                DME Arranged: N/A DME Agency: NA     Representative spoke with at DME Agency: na HH Arranged: RN, PT [place] Agency: [place] ([facility]) Date [place]: 01/16/19 Time [place]: 1422 Representative spoke with at [place]: [place] ([place]) Interventions     Readmission Risk Interventions Readmission Risk Prevention Plan 01/04/2019 01/04/2019 12/11/2018  Transportation Screening Complete Complete Complete  HRI or [place] - - Complete  Social Work Consult for [place] Planning/Counseling - - Complete  Palliative Care Screening - - Not Applicable  Medication Review Press photographer) Complete Complete Complete  PCP or Specialist appointment within 3-5 days of discharge Complete Complete -  [place] or Home Care Consult Complete Complete -  Palliative Care Screening Not Applicable Not Applicable -  [place] Not Applicable Patient Refused -  Some recent data might be hidden

## 2019-01-16 NOTE — Plan of Care (Signed)
  Problem: Education: Goal: Understanding of CV disease, CV risk reduction, and recovery process will improve Outcome: Adequate for Discharge Goal: Individualized Educational Video(s) Outcome: Adequate for Discharge   Problem: Activity: Goal: Ability to return to baseline activity level will improve Outcome: Adequate for Discharge   Problem: Cardiovascular: Goal: Ability to achieve and maintain adequate cardiovascular perfusion will improve Outcome: Adequate for Discharge Goal: Vascular access site(s) Level 0-1 will be maintained Outcome: Adequate for Discharge   Problem: Health Behavior/Discharge Planning: Goal: Ability to safely manage health-related needs after discharge will improve Outcome: Adequate for Discharge   Problem: Health Behavior/Discharge Planning: Goal: Ability to manage health-related needs will improve Outcome: Adequate for Discharge   Problem: Activity: Goal: Risk for activity intolerance will decrease Outcome: Adequate for Discharge   Problem: Coping: Goal: Level of anxiety will decrease Outcome: Adequate for Discharge   Problem: Elimination: Goal: Will not experience complications related to bowel motility Outcome: Adequate for Discharge Goal: Will not experience complications related to urinary retention Outcome: Adequate for Discharge   Problem: Pain Managment: Goal: General experience of comfort will improve Outcome: Adequate for Discharge   Problem: Skin Integrity: Goal: Risk for impaired skin integrity will decrease Outcome: Adequate for Discharge

## 2019-01-16 NOTE — Telephone Encounter (Signed)
-----   Message from Arvil Chaco, PA-C sent at 01/16/2019  1:22 PM EDT ----- Regarding: TCM Hello,  This patient was admitted and seen at [facility] for cardiac cath.  We are expecting discharge today 01/16/19.  (1) Can you please call and arrange / schedule for follow-up TCM appointment with Dr. Garen Lah within the next two weeks? (2) He needs a follow-up appointment scheduled with urology. We were asked to help arrange this for him. Can you arrange this as well within the next 2 weeks? If not, please let me know.  Thank you! Signed, Arvil Chaco, PA-C 01/16/2019, 1:22 PM Pager [phone]

## 2019-01-16 NOTE — Progress Notes (Signed)
Progress Note  Patient Name: Roger Pruitt Date of Encounter: 01/16/2019  Primary Cardiologist: Kate Sable, MD   Subjective   Patient seen this morning, denies any episode of chest pain or shortness of breath.   Inpatient Medications    Scheduled Meds: . aspirin EC  81 mg Oral Daily  . atorvastatin  40 mg Oral q1800  . buPROPion  300 mg Oral Q1200  . carvedilol  12.5 mg Oral BID  . Chlorhexidine Gluconate Cloth  6 each Topical Daily  . clopidogrel  75 mg Oral Q breakfast  . doxepin  50 mg Oral QHS  . enoxaparin (LOVENOX) injection  40 mg Subcutaneous Q24H  . gabapentin  300 mg Oral Daily  . hydrALAZINE  50 mg Oral TID  . insulin aspart  0-15 Units Subcutaneous TID WC  . insulin aspart  0-5 Units Subcutaneous QHS  . ipratropium-albuterol  3 mL Inhalation Daily  . isosorbide mononitrate  60 mg Oral Daily  . lisinopril  20 mg Oral BID  . LORazepam  1 mg Oral Q breakfast  . LORazepam  2 mg Oral QHS  . mometasone-formoterol  2 puff Inhalation BID  . pantoprazole  40 mg Oral Daily  . risperiDONE  1 mg Oral QHS  . sodium chloride flush  3 mL Intravenous Q12H  . sodium chloride flush  3 mL Intravenous Q12H  . tamsulosin  0.4 mg Oral Daily  . topiramate  100 mg Oral Q1200   Continuous Infusions: . sodium chloride     PRN Meds: sodium chloride, acetaminophen, nitroGLYCERIN, ondansetron (ZOFRAN) IV, sodium chloride flush   Vital Signs    Vitals:   01/15/19 1937 01/16/19 0541 01/16/19 0751 01/16/19 1017  BP: (!) 152/84   (!) 145/84  Pulse: 63   60  Resp: 18   18  Temp: 98.7 F (37.1 C)   97.7 F (36.5 C)  TempSrc: Oral   Oral  SpO2: 99%  96% 99%  Weight:  96.3 kg    Height:        Intake/Output Summary (Last 24 hours) at 01/16/2019 1238 Last data filed at 01/16/2019 1123 Gross per 24 hour  Intake 512.8 ml  Output 2700 ml  Net -2187.2 ml   Last 3 Weights 01/16/2019 01/15/2019 01/15/2019  Weight (lbs) 212 lb 3.2 oz 212 lb 1.6 oz 212 lb 11.9 oz   Weight (kg) 96.253 kg 96.208 kg 96.5 kg      Telemetry    Sinus rhythm, occasional SVC's.- Personally Reviewed  ECG    None performed today  Physical Exam   GEN: No acute distress.   Neck: No JVD Cardiac: RRR, no murmurs, rubs, or gallops.  Respiratory: Clear to auscultation bilaterally. GI: Soft, nontender, non-distended  MS: No edema; No deformity. Neuro:  Nonfocal  Psych: Normal affect   Labs    High Sensitivity Troponin:  No results for input(s): TROPONINIHS in the last 720 hours.    Chemistry Recent Labs  Lab 01/16/19 0555  NA 141  K 3.8  CL 113*  CO2 21*  GLUCOSE 89  BUN 14  CREATININE 0.86  CALCIUM 8.8*  GFRNONAA >60  GFRAA >60  ANIONGAP 7     Hematology Recent Labs  Lab 01/16/19 0555  WBC 9.3  RBC 4.11*  HGB 11.8*  HCT 37.1*  MCV 90.3  MCH 28.7  MCHC 31.8  RDW 14.1  PLT 185    BNPNo results for input(s): BNP, PROBNP in the last 168 hours.  DDimer No results for input(s): DDIMER in the last 168 hours.   Radiology    No results found.  Cardiac Studies  Left heart cath 01/15/2019 Conclusions: 1. Severe multivessel coronary artery disease, including chronic total occlusion of mid LAD, sequential 80-90% ostial, proximal, and mid D1 stenoses, chronic total occlusion of D2, diffusely diseased LCx with distal occlusion, and diffusely diseased RCA with up to 60-70% stenosis at the crux. 2. Patent LIMA-LAD with diffuse disease in the distal LAD beyond the LIMA anastomosis with up to 80% stenosis. 3. Patent SVG Y-graft to D2 and OM.  Limb to OM 2 demonstrates 40% stenosis at the proximal anastomosis. 4. Chronically occluded SVG-rPDA. 5. Normal left ventricular ejection fraction with mildly elevated left ventricular filling pressure. 6. Challenging but successful PCI to ostial through mid D1 using overlapping Synergy 2.5 x 8 mm (proximal), 2.25 x 20 mm (mid), and 2.25 x 16 mm (distal) drug eluting stents with 0% residual stenosis and TIMI-3  flow.  Recommendations: 1. Overnight extended recovery. 2. Dual antiplatelet therapy with aspirin and clopidogrel for at least 12 months, ideally longer given disease burden a long segment of overlapping stents in D1. 3. Aggressive secondary prevention and antianginal medical therapy for treatment of residual disease.  Patient Profile     68 y.o. male with history of CAD/CABG x4 in 2017 presents for left heart cath after a Lexiscan myocardial perfusion imaging stress test was normal.    Assessment & Plan    Patient underwent left heart cath with stent placement x 3 25 twice daily the first diagonal.  He removed his urinary catheter previously placed by urology.  He currently has a Foley in place.  Continue aspirin, Plavix for at least 1 year.  Continue Lipitor as prescribed. Continue Coreg and Imdur as currently prescribed.  If he has any more chest pains will maximize therapy by increasing Coreg dose to 25 mg twice daily and also consider adding Ranexa to his antianginal regimen. Patient to see urology on discharge for instructions regarding self-catheterization.  For questions or updates, please contact [place] Please consult [email or web address] for contact info under        Signed, Kate Sable, MD  01/16/2019, 12:38 PM

## 2019-01-16 NOTE — Progress Notes (Signed)
Patient being cleared for discharge by cardiology, urology paged to ask about foley catheter. Spoke with Hollice Espy MD Urology and per MD ok to discharge patient with urinary foley catheter and have them follow up with urology outpatient. Patient will be discharging today.

## 2019-01-16 NOTE — Progress Notes (Signed)
Nutrition Brief Note  Patient identified on the Malnutrition Screening Tool (MST) Report  68 y/o male presented for outpatient Cardiac Catheterization with dx of unstable angina.    Wt Readings from Last 15 Encounters:  01/16/19 96.3 kg  01/08/19 96.5 kg  01/07/19 98.4 kg  01/04/19 97.3 kg  12/24/18 100.2 kg  12/13/18 100.6 kg  12/11/18 117.7 kg  12/22/2018 92.5 kg  12/01/18 92.7 kg  10/04/18 94.6 kg  09/26/18 93 kg  09/21/18 93 kg  09/05/18 89.4 kg  07/07/18 89.4 kg  03/29/18 87.1 kg    Body mass index is 26.52 kg/m. Patient meets criteria for overweight based on current BMI. Per chart, pt is weight stable pta.   Current diet order is HH/CHO, patient is consuming approximately 95% of meals at this time. Labs and medications reviewed.   No nutrition interventions warranted at this time. If nutrition issues arise, please consult RD.   Koleen Distance MS, RD, LDN Pager #- [phone] Office#- [phone] After Hours Pager: [phone]

## 2019-01-17 ENCOUNTER — Ambulatory Visit: Payer: Medicare Other

## 2019-01-18 NOTE — Telephone Encounter (Signed)
Patient has been scheduled 11/06 with C. Sharolyn Douglas

## 2019-01-22 ENCOUNTER — Ambulatory Visit: Payer: Medicare Other | Admitting: Physician Assistant

## 2019-01-23 NOTE — Progress Notes (Signed)
01/24/2019 10:14 AM   [name] [name] 25-Sep-1950 [id number]  Referring provider: Casilda Carls, [address] [place],  [place] [address]  Chief Complaint  Patient presents with  . Follow-up    HPI: Mr. Doolen is a 68 year old male with a history of hematuria, BPH with LU TS, rUTI's a prostate nodule who presents today for follow up after a heart cath.   Patient had cardiac stents placed on 01/15/2019.    History of hematuria (high risk) Former smoker.  Hematuria work up x 3, 2011, 2015 & 2017 - findings positive for bilateral nephrolithiasis, bilateral renal cysts and enlarged friable prostate - s/p KTP in 2016.  Contrast CT on 12/10/2018 for abdominal pain revealed marked urinary bladder distention, possible outlet obstruction.  Post TURP changes of the prostate  Nonobstructing kidney stonesNo report of gross hematuria. Recent UA's with microscopic hematuria associated with infection.  Most recent UA on 01/07/2019 negative for microscopic hematuria.    BPH WITH LUTS  (prostate and/or bladder) Patient had Foley placed on 01/07/2019 for a PVR of > 700cc.  He presents today for its removal.  Previous score: 20/5   Previous PVR: 781 mL   Major complaint(s):  Frequency, incontinence and weak stream.  Patient denies any gross hematuria, dysuria or suprapubic/flank pain.  Patient denies any fevers, chills, nausea or vomiting.     Currently taking: Flomax.  His has had KTP in 2016.    He still has not received word from the catheter company, but he has catheter samples at home.-   He does not have a family history of PCa.  IPSS    Row Name 01/24/19 0900         International Prostate Symptom Score   How often have you had the sensation of not emptying your bladder?  Less than half the time     How often have you had to urinate less than every two hours?  More than half the time     How often have you found you stopped and started again several times when you urinated?  Not  at All     How often have you found it difficult to postpone urination?  Almost always     How often have you had a weak urinary stream?  About half the time     How often have you had to strain to start urination?  Not at All     How many times did you typically get up at night to urinate?  None     Total IPSS Score  14       Quality of Life due to urinary symptoms   If you were to spend the rest of your life with your urinary condition just the way it is now how would you feel about that?  Terrible      rUTI's Risk factors: age, BPH, urinary retention and self cathing Recently hospitalized for UTI - urine cultures positive for Viridans streptococcus   Prostate nodule Prostate MRI pending for 1 cm slightly firm nodule in the right lobe  Component     Latest Ref Rng & Units 11/21/2014 10/26/2015 10/18/2016 10/26/2016  Prostate Specific Ag, Serum     0.0 - 4.0 ng/mL 0.4 0.6 3.3 3.0   Component     Latest Ref Rng & Units 05/08/2017  Prostate Specific Ag, Serum     0.0 - 4.0 ng/mL 1.4   Component     Latest Ref  Rng & Units 12/24/2018  Prostatic Specific Antigen     0.00 - 4.00 ng/mL 1.37    PMH: Past Medical History:  Diagnosis Date  . Abdominal pain   . Acute cystitis without hematuria   . Anemia   . Anginal pain ([place])    rarely  . Arthritis    knees and hands  . Asthma   . Bipolar disorder ([place])    BIPOLAR/ SCHIZO/ AFFECTIVE   . BPD (bronchopulmonary dysplasia)   . BPH (benign prostatic hyperplasia)   . CAD (coronary artery disease)    a. s/p MI 2001, 2012;  b. s/p prior LCX stenting; b. 05/2012 Abnl MV; c. 11/2015 Cath: 3VD-->12/2015 CABG x 4 (LIMA->LAD, VG->D1, VG->OM2, VG->RPDA); d. 12/2018 MV: mid-dist inflat ischemia. Basal inf lat infarct. EF 55%.  . Carpal tunnel syndrome    surgery both hands  . Depression   . Diastolic dysfunction    a. 07/2014 Echo: EF 60-65%, no rwma, nl LA size, nl RV/PASP; b. 12/2018 Echo: EF 50-55%, mod LVH. Inf HK. Impaired relaxation. Mildly  red RV fxn. Mildly dil LA. Ao root 63m.  . Disc degeneration, lumbar   . Dyspnea    WITH EXERTION  . Elevated PSA   . GERD (gastroesophageal reflux disease)   . Hematuria    history of gross and microscopic  . Hemorrhoid   . History of balanitis   . History of bladder infections   . History of urethral stricture   . Hyperlipidemia   . Hypertensive heart disease   . Incomplete bladder emptying   . Iron deficiency anemia, unspecified   . Lower urinary tract symptoms   . Myocardial infarction ([place]    X7 LAST 2001  . Neuropathy   . Nocturia   . Obsessive compulsive disorder   . Other dysphagia   . Panic attack   . Parkinson's disease ([place]   . Sleep apnea    SURGICAL TX  . Stroke ([place]   . Type II diabetes mellitus ([place]   . Unspecified vitamin D deficiency   . Urethral stricture   . Urinary retention   . Varicose vein of leg   . Vertigo    hx of  . Vitamin D deficiency   . Wears dentures    upper and lowers    Surgical History: Past Surgical History:  Procedure Laterality Date  . BACK SURGERY     lumbar disc  . BACK SURGERY  1984  . CARDIAC CATHETERIZATION  6/10;2011;Aug.2012;Oct. 2012;   . CARDIAC CATHETERIZATION  05/2012   [facility]; EF 60%, patent stent in the left circumflex, moderate three-vessel disease with no flow-limiting lesions. Unchanged from most recent catheterization  . CARDIAC CATHETERIZATION N/A 12/10/2015   Procedure: Left Heart Cath and Coronary Angiography;  Surgeon: BCorey Skains MD;  [facility];  Service: Cardiovascular;  Laterality: N/A;  . CARPAL TUNNEL RELEASE  2009   left  . CATARACT EXTRACTION W/PHACO Left 02/14/2017   Procedure: CATARACT EXTRACTION PHACO AND INTRAOCULAR LENS PLACEMENT (IOC);  Surgeon: PBirder Robson MD;  Location: [facility];  Service: Ophthalmology;  Laterality: Left;  [id number]:42 AP% 16.6 CDE 7.14 Fluid pack lot # [id number]  . CATARACT EXTRACTION W/PHACO Right 02/28/2017   Procedure: CATARACT  EXTRACTION PHACO AND INTRAOCULAR LENS PLACEMENT (IOC);  Surgeon: PBirder Robson MD;  Location: [facility];  Service: Ophthalmology;  Laterality: Right;  [place]01:09 AP% 11.4 CDE 7.98 Fluid pack lot # [id number]  . COLONOSCOPY WITH PROPOFOL N/A  06/29/2017   Procedure: COLONOSCOPY WITH PROPOFOL;  Surgeon: [name], MD;  Location: [place] [facility];  Service: Endoscopy;  Laterality: N/A;  Diabetic - oral meds  . CORONARY ANGIOPLASTY  2011 & 2001   s/p stent  . CORONARY ARTERY BYPASS GRAFT     stent placement  . CORONARY ARTERY BYPASS GRAFT N/A 12/25/2015   Procedure: CORONARY ARTERY BYPASS GRAFTING (CABG) x4  , using left internal mammary artery and right leg greater saphenous vein harvested endoscopically LIMA-LAD SVG-OM2 SVG-DIAG SVG-PD;  Surgeon: Melrose Nakayama, MD;  Location: [facility];  Service: Open Heart Surgery;  Laterality: N/A;  . CORONARY STENT INTERVENTION N/A 01/15/2019   Procedure: CORONARY STENT INTERVENTION;  Surgeon: Nelva Bush, MD;  Location: [facility];  Service: Cardiovascular;  Laterality: N/A;  . [place]  . CYST REMOVAL LEG Right   . ESOPHAGOGASTRODUODENOSCOPY  06/29/2017   Procedure: ESOPHAGOGASTRODUODENOSCOPY (EGD);  Surgeon: [name], MD;  Location: [facility];  Service: Endoscopy;;  . LEFT HEART CATH AND CORS/GRAFTS ANGIOGRAPHY Left 01/15/2019   Procedure: LEFT HEART CATH AND CORS/GRAFTS ANGIOGRAPHY;  Surgeon: Nelva Bush, MD;  Location: [facility];  Service: Cardiovascular;  Laterality: Left;  . POLYPECTOMY  06/29/2017   Procedure: POLYPECTOMY INTESTINAL;  Surgeon: [name], MD;  Location: [facility];  Service: Endoscopy;;  . SKIN GRAFT    . TEE WITHOUT CARDIOVERSION N/A 12/25/2015   Procedure: TRANSESOPHAGEAL ECHOCARDIOGRAM (TEE);  Surgeon: Melrose Nakayama, MD;  Location: [facility];  Service: Open Heart Surgery;  Laterality: N/A;  . TONSILLECTOMY    . UVULOPALATOPHARYNGOPLASTY      Home  Medications:  Allergies as of 01/24/2019      Reactions   Morphine And Related Nausea And Vomiting   Synalgos-dc [aspirin-caff-dihydrocodeine] Itching, Other (See Comments)   Reaction:  Stinging    Synchlor A [chlorpheniramine] Rash, Other (See Comments)   Patient doesn't recall this allergy.      Medication List       Accurate as of January 24, 2019 10:14 AM. If you have any questions, ask your nurse or doctor.        acetaminophen 500 MG tablet Commonly known as: TYLENOL Take 1,000 mg by mouth every 6 (six) hours as needed for moderate pain or headache.   Aspirin 81 81 MG EC tablet Generic drug: aspirin Take 81 mg by mouth daily.   atorvastatin 40 MG tablet Commonly known as: LIPITOR Take 1 tablet (40 mg total) by mouth daily at 6 PM.   buPROPion 300 MG 24 hr tablet Commonly known as: WELLBUTRIN XL Take 300 mg by mouth daily. noon   carvedilol 12.5 MG tablet Commonly known as: COREG Take 12.5 mg by mouth 2 (two) times daily.   clopidogrel 75 MG tablet Commonly known as: PLAVIX Take 1 tablet (75 mg total) by mouth daily with breakfast.   Combivent Respimat 20-100 MCG/ACT Aers respimat Generic drug: Ipratropium-Albuterol INHALE ONE PUFF INTO THE LUNGS EVERY 6 HOURS What changed:   how much to take  how to take this  when to take this  additional instructions   doxepin 25 MG capsule Commonly known as: SINEQUAN Take 50 mg by mouth at bedtime.   fluticasone 50 MCG/ACT nasal spray Commonly known as: FLONASE Place 2 sprays into the nose daily.   gabapentin 300 MG capsule Commonly known as: NEURONTIN Take 300 mg by mouth daily.   hydrALAZINE 50 MG tablet Commonly known as: APRESOLINE Take 1 tablet (50  mg total) by mouth 3 (three) times daily.   isosorbide mononitrate 60 MG 24 hr tablet Commonly known as: IMDUR Take 1 tablet (60 mg total) by mouth daily.   Janumet 50-1000 MG tablet Generic drug: sitaGLIPtin-metformin Take 1 tablet by mouth 2 (two)  times daily. Am and pm   lisinopril 20 MG tablet Commonly known as: ZESTRIL Take 20 mg by mouth 2 (two) times daily.   LORazepam 1 MG tablet Commonly known as: ATIVAN Take 1-2 tablets (1-2 mg total) by mouth See admin instructions. Take 1 mg by mouth in the morning and take 2 mg by mouth at bedtime   nitroGLYCERIN 0.4 MG/SPRAY spray Commonly known as: NITROLINGUAL Place 1 spray under the tongue every 5 (five) minutes x 3 doses as needed for chest pain.   pantoprazole 40 MG tablet Commonly known as: PROTONIX Take 40 mg by mouth daily.   risperiDONE 1 MG tablet Commonly known as: RISPERDAL Take 1 tablet (1 mg total) by mouth at bedtime.   Symbicort 160-4.5 MCG/ACT inhaler Generic drug: budesonide-formoterol INHALE TWO PUFFS INTO THE LUNGS TWICE DAILY   tamsulosin 0.4 MG Caps capsule Commonly known as: FLOMAX Take 1 capsule (0.4 mg total) by mouth daily.   topiramate 100 MG tablet Commonly known as: TOPAMAX Take 100 mg by mouth daily at 12 noon.       Allergies:  Allergies  Allergen Reactions  . Morphine And Related Nausea And Vomiting  . Synalgos-Dc [Aspirin-Caff-Dihydrocodeine] Itching and Other (See Comments)    Reaction:  Stinging   . Synchlor A [Chlorpheniramine] Rash and Other (See Comments)    Patient doesn't recall this allergy.    Family History: Family History  Problem Relation Age of Onset  . Heart disease Mother   . Heart disease Brother   . Prostate cancer Father   . Heart disease Other   . Heart disease Brother   . Kidney disease Neg Hx   . Kidney cancer Neg Hx   . Bladder Cancer Neg Hx     Social History:  reports that he quit smoking about 19 years ago. His smoking use included cigarettes. He has a 7.50 pack-year smoking history. He has never used smokeless tobacco. He reports that he does not drink alcohol or use drugs.  ROS: UROLOGY Frequent Urination?: Yes Hard to postpone urination?: No Burning/pain with urination?: No Get up at  night to urinate?: No Leakage of urine?: Yes Urine stream starts and stops?: No Trouble starting stream?: No Do you have to strain to urinate?: No Blood in urine?: No Urinary tract infection?: No Sexually transmitted disease?: No Injury to kidneys or bladder?: No Painful intercourse?: No Weak stream?: Yes Erection problems?: Yes Penile pain?: Yes  Gastrointestinal Nausea?: No Vomiting?: No Indigestion/heartburn?: No Diarrhea?: No Constipation?: No  Constitutional Fever: No Night sweats?: No Weight loss?: No Fatigue?: No  Skin Skin rash/lesions?: No Itching?: No  Eyes Blurred vision?: No Double vision?: No  Ears/Nose/Throat Sore throat?: No  Hematologic/Lymphatic Swollen glands?: No Easy bruising?: Yes  Cardiovascular Leg swelling?: No Chest pain?: Yes  Respiratory Cough?: No Shortness of breath?: Yes  Endocrine Excessive thirst?: No  Musculoskeletal Back pain?: Yes Joint pain?: No  Neurological Headaches?: No Dizziness?: Yes  Psychologic Depression?: Yes Anxiety?: No  Physical Exam: BP (!) 101/57   Pulse 73   Ht '6\' 3"'  (1.905 m)   Wt 215 lb (97.5 kg)   BMI 26.87 kg/m   Constitutional:  Well nourished. Alert and oriented, No acute distress. HEENT:  [place] AT, moist mucus membranes.  Trachea midline, no masses. Cardiovascular: No clubbing, cyanosis, or edema. Respiratory: Normal respiratory effort, no increased work of breathing. Neurologic: Grossly intact, no focal deficits, moving all 4 extremities. Psychiatric: Normal mood and affect.  Laboratory Data: Urinalysis  Component     Latest Ref Rng & Units 01/07/2019  Specific Gravity, UA     1.005 - 1.030 1.010  pH, UA     5.0 - 7.5 6.5  Color, UA     Yellow Yellow  Appearance Ur     Clear Hazy (A)  Leukocytes,UA     Negative Trace (A)  Protein,UA     Negative/Trace Negative  Glucose, UA     Negative Negative  Ketones, UA     Negative Negative  RBC, UA     Negative Negative   Bilirubin, UA     Negative Negative  Urobilinogen, Ur     0.2 - 1.0 mg/dL 0.2  Nitrite, UA     Negative Negative  Microscopic Examination      See below:   Component     Latest Ref Rng & Units 01/07/2019          WBC, UA     0 - 5 /hpf 11-30 (A)  RBC     4.14 - 5.80 x10E6/uL 0-2  Epithelial Cells (non renal)     0 - 10 /hpf 0-10  Renal Epithel, UA     None seen /hpf 0-10 (A)  Bacteria, UA     None seen/Few None seen   Lab Results  Component Value Date   WBC 9.3 01/16/2019   HGB 11.8 (L) 01/16/2019   HCT 37.1 (L) 01/16/2019   MCV 90.3 01/16/2019   PLT 185 01/16/2019    Lab Results  Component Value Date   CREATININE 0.86 01/16/2019    No results found for: PSA  No results found for: TESTOSTERONE  Lab Results  Component Value Date   HGBA1C 5.7 (H) 12/11/2018    Lab Results  Component Value Date   TSH 1.191 11/30/2018       Component Value Date/Time   CHOL 91 09/05/2018 0438   CHOL 124 01/31/2014 0448   HDL 27 (L) 09/05/2018 0438   HDL 25 (L) 01/31/2014 0448   CHOLHDL 3.4 09/05/2018 0438   VLDL 12 09/05/2018 0438   VLDL 23 01/31/2014 0448   LDLCALC 52 09/05/2018 0438   LDLCALC 76 01/31/2014 0448    Lab Results  Component Value Date   AST 15 01/02/2019   Lab Results  Component Value Date   ALT 16 01/02/2019   No components found for: ALKALINEPHOPHATASE No components found for: BILIRUBINTOTAL  No results found for: ESTRADIOL  I have reviewed the labs.  Assessment & Plan:    1. rUTI's Asymptomatic at this visit Criteria for recurrent UTI has been met with 2 or more infections in 6 months or 3 or greater infections in one year  Likely due to infrequent catheterizations and retained urine We have been in conversation with the catheter company and will follow up to ensure that they make contact with the patient and get him established with monthly catheter deliveries.                                       2. Urinary retention Foley  removed today He will self cath 3 times daily.  We will postpone cystoscopy at this time until he is recovered from his cardiac caths.  He will follow-up in 1 month with IPSS and PVR.  3. History of hematuria Hematuria work up completed in 2011, 2015 and 2017 - findings positive for bilateral nephrolithiasis, bilateral renal cysts and enlarged friable prostate No report of gross hematuria   4. BPH with LUTS Continue conservative management, avoiding bladder irritants and timed voiding's Most bothersome symptoms is/are frequency and a weak urinary stream Continue tamsulosin 0.4 mg daily; refills given  5. Prostate nodule Will hold on prostate MRI for now and continue to monitor with exams and PSA's                                  Return in about 1 month (around 02/24/2019) for IPSS and PVR.  These notes generated with voice recognition software. I apologize for typographical errors.  Zara Council, PA-C  [facility] [address]  [place], [place] [address] [phone]

## 2019-01-24 ENCOUNTER — Telehealth: Payer: Self-pay

## 2019-01-24 ENCOUNTER — Ambulatory Visit (INDEPENDENT_AMBULATORY_CARE_PROVIDER_SITE_OTHER): Payer: Medicare Other | Admitting: Urology

## 2019-01-24 ENCOUNTER — Other Ambulatory Visit: Payer: Self-pay

## 2019-01-24 ENCOUNTER — Encounter: Payer: Self-pay | Admitting: Urology

## 2019-01-24 VITALS — BP 101/57 | HR 73 | Ht 75.0 in | Wt 215.0 lb

## 2019-01-24 DIAGNOSIS — N390 Urinary tract infection, site not specified: Secondary | ICD-10-CM

## 2019-01-24 DIAGNOSIS — R339 Retention of urine, unspecified: Secondary | ICD-10-CM

## 2019-01-24 DIAGNOSIS — N401 Enlarged prostate with lower urinary tract symptoms: Secondary | ICD-10-CM

## 2019-01-24 DIAGNOSIS — Z87448 Personal history of other diseases of urinary system: Secondary | ICD-10-CM

## 2019-01-24 DIAGNOSIS — N402 Nodular prostate without lower urinary tract symptoms: Secondary | ICD-10-CM | POA: Diagnosis not present

## 2019-01-24 DIAGNOSIS — N138 Other obstructive and reflux uropathy: Secondary | ICD-10-CM

## 2019-01-24 DIAGNOSIS — I200 Unstable angina: Secondary | ICD-10-CM

## 2019-01-24 NOTE — Telephone Encounter (Signed)
Patient was seen in office today.  He says he has never gotten a call from [employer] for catheters.  Spoke with [employer] rep and they do not have an order on file.  New order faxed.

## 2019-01-28 ENCOUNTER — Encounter: Payer: Self-pay | Admitting: Emergency Medicine

## 2019-01-28 ENCOUNTER — Ambulatory Visit
Admission: EM | Admit: 2019-01-28 | Discharge: 2019-01-28 | Discharge status: Against Medical Advice | Payer: Medicare Other | Attending: Family Medicine | Admitting: Family Medicine

## 2019-01-28 ENCOUNTER — Other Ambulatory Visit: Payer: Self-pay

## 2019-01-28 NOTE — ED Triage Notes (Signed)
Patient had 3 stents placed.

## 2019-01-28 NOTE — ED Triage Notes (Signed)
Patient refuses Covid and Flu test.

## 2019-01-28 NOTE — ED Triage Notes (Signed)
Patient in today c/o cough, fever (100) and body aches since yesterday. Patient has not tried any OTC medications. Patient was recently in hospital for a heart cath and discharged 01/18/19.

## 2019-01-29 ENCOUNTER — Other Ambulatory Visit: Payer: Self-pay

## 2019-01-29 DIAGNOSIS — Z20822 Contact with and (suspected) exposure to covid-19: Secondary | ICD-10-CM

## 2019-01-30 LAB — NOVEL CORONAVIRUS, NAA: SARS-CoV-2, NAA: NOT DETECTED

## 2019-01-31 ENCOUNTER — Telehealth: Payer: Self-pay

## 2019-01-31 ENCOUNTER — Other Ambulatory Visit: Payer: Medicare Other

## 2019-01-31 NOTE — Telephone Encounter (Signed)

## 2019-02-01 ENCOUNTER — Ambulatory Visit: Payer: Medicare Other | Admitting: Nurse Practitioner

## 2019-02-01 ENCOUNTER — Other Ambulatory Visit: Payer: Self-pay | Admitting: *Deleted

## 2019-02-01 ENCOUNTER — Emergency Department
Admission: EM | Admit: 2019-02-01 | Discharge: 2019-02-01 | Disposition: A | Discharge status: Home / Self Care | Payer: Medicare Other

## 2019-02-01 ENCOUNTER — Other Ambulatory Visit: Payer: Self-pay

## 2019-02-01 DIAGNOSIS — R339 Retention of urine, unspecified: Secondary | ICD-10-CM

## 2019-02-01 NOTE — ED Notes (Signed)
No answer throughout the lobby or in the bathroom or outside lobby

## 2019-02-03 ENCOUNTER — Emergency Department
Admission: EM | Admit: 2019-02-03 | Discharge: 2019-02-03 | Disposition: A | Discharge status: Home / Self Care | Payer: Medicare Other | Attending: Emergency Medicine | Admitting: Emergency Medicine

## 2019-02-03 ENCOUNTER — Other Ambulatory Visit: Payer: Self-pay

## 2019-02-03 DIAGNOSIS — I119 Hypertensive heart disease without heart failure: Secondary | ICD-10-CM | POA: Insufficient documentation

## 2019-02-03 DIAGNOSIS — G20 Parkinson's disease: Secondary | ICD-10-CM | POA: Insufficient documentation

## 2019-02-03 DIAGNOSIS — E119 Type 2 diabetes mellitus without complications: Secondary | ICD-10-CM | POA: Insufficient documentation

## 2019-02-03 DIAGNOSIS — Z7982 Long term (current) use of aspirin: Secondary | ICD-10-CM | POA: Insufficient documentation

## 2019-02-03 DIAGNOSIS — Z7984 Long term (current) use of oral hypoglycemic drugs: Secondary | ICD-10-CM | POA: Insufficient documentation

## 2019-02-03 DIAGNOSIS — Z79899 Other long term (current) drug therapy: Secondary | ICD-10-CM | POA: Diagnosis not present

## 2019-02-03 DIAGNOSIS — Z87891 Personal history of nicotine dependence: Secondary | ICD-10-CM | POA: Insufficient documentation

## 2019-02-03 DIAGNOSIS — R52 Pain, unspecified: Secondary | ICD-10-CM | POA: Diagnosis not present

## 2019-02-03 DIAGNOSIS — N3000 Acute cystitis without hematuria: Secondary | ICD-10-CM | POA: Diagnosis not present

## 2019-02-03 DIAGNOSIS — R1084 Generalized abdominal pain: Secondary | ICD-10-CM | POA: Diagnosis not present

## 2019-02-03 DIAGNOSIS — R109 Unspecified abdominal pain: Secondary | ICD-10-CM | POA: Diagnosis present

## 2019-02-03 LAB — URINALYSIS, COMPLETE (UACMP) WITH MICROSCOPIC
Bilirubin Urine: NEGATIVE
Glucose, UA: NEGATIVE mg/dL
Hgb urine dipstick: NEGATIVE
Ketones, ur: NEGATIVE mg/dL
Nitrite: POSITIVE — AB
Protein, ur: 30 mg/dL — AB
Specific Gravity, Urine: 1.01 (ref 1.005–1.030)
WBC, UA: >50 WBC/hpf — ABNORMAL HIGH (ref 0–5)
pH: 6 (ref 5.0–8.0)

## 2019-02-03 LAB — COMPREHENSIVE METABOLIC PANEL
ALT: 11 U/L (ref 0–44)
AST: 14 U/L — ABNORMAL LOW (ref 15–41)
Albumin: 3.5 g/dL (ref 3.5–5.0)
Alkaline Phosphatase: 53 U/L (ref 38–126)
Anion gap: 10 (ref 5–15)
BUN: 22 mg/dL (ref 8–23)
CO2: 21 mmol/L — ABNORMAL LOW (ref 22–32)
Calcium: 9.3 mg/dL (ref 8.9–10.3)
Chloride: 107 mmol/L (ref 98–111)
Creatinine, Ser: 0.98 mg/dL (ref 0.61–1.24)
GFR calc Af Amer: >60 mL/min (ref >60)
GFR calc non Af Amer: >60 mL/min (ref >60)
Glucose, Bld: 96 mg/dL (ref 70–99)
Potassium: 3.5 mmol/L (ref 3.5–5.1)
Sodium: 138 mmol/L (ref 135–145)
Total Bilirubin: 1.2 mg/dL (ref 0.3–1.2)
Total Protein: 6.7 g/dL (ref 6.5–8.1)

## 2019-02-03 LAB — CBC
HCT: 37.4 % — ABNORMAL LOW (ref 39.0–52.0)
Hemoglobin: 12.1 g/dL — ABNORMAL LOW (ref 13.0–17.0)
MCH: 28.3 pg (ref 26.0–34.0)
MCHC: 32.4 g/dL (ref 30.0–36.0)
MCV: 87.6 fL (ref 80.0–100.0)
Platelets: 215 10*3/uL (ref 150–400)
RBC: 4.27 MIL/uL (ref 4.22–5.81)
RDW: 14.3 % (ref 11.5–15.5)
WBC: 9.1 10*3/uL (ref 4.0–10.5)
nRBC: 0 % (ref 0.0–0.2)

## 2019-02-03 LAB — CK: Total CK: 50 U/L (ref 49–397)

## 2019-02-03 LAB — LIPASE, BLOOD: Lipase: 20 U/L (ref 11–51)

## 2019-02-03 LAB — LACTIC ACID, PLASMA: Lactic Acid, Venous: 1.4 mmol/L (ref 0.5–1.9)

## 2019-02-03 MED ORDER — SODIUM CHLORIDE 0.9 % IV SOLN
1.0000 g | Freq: Once | INTRAVENOUS | Status: AC
  Administered 2019-02-03: 1 g via INTRAVENOUS
  Filled 2019-02-03: qty 10

## 2019-02-03 MED ORDER — SODIUM CHLORIDE 0.9 % IV BOLUS
1000.0000 mL | Freq: Once | INTRAVENOUS | Status: AC
  Administered 2019-02-03: 1000 mL via INTRAVENOUS

## 2019-02-03 MED ORDER — CEPHALEXIN 500 MG PO CAPS: 500.0000 mg | ORAL_CAPSULE | Freq: Four times a day (QID) | ORAL | 0 refills | Status: AC

## 2019-02-03 MED ORDER — CEPHALEXIN 500 MG PO CAPS
500.0000 mg | ORAL_CAPSULE | Freq: Once | ORAL | Status: AC
  Administered 2019-02-03: 22:00:00 500 mg via ORAL
  Filled 2019-02-03: qty 1

## 2019-02-03 NOTE — ED Triage Notes (Signed)
Pt arrives via ems from home. Ems reports abd pain starting this morning across abd over umbilical region. Pt states he hurts all over, denied any n/v/d. Pt a&ox 4 on arrival NAD noted at this time

## 2019-02-03 NOTE — ED Notes (Signed)
Pt given urinal and encouraged to void so specimen can be sent to the lab; denies pain;

## 2019-02-03 NOTE — ED Provider Notes (Signed)
[facility] Emergency Department Provider Note  ____________________________________________  Time seen: Approximately 6:03 PM  I have reviewed the triage vital signs and the nursing notes.   HISTORY  Chief Complaint Abdominal Pain    HPI Roger Pruitt is a 68 y.o. male who presents the emergency department complaining of diffuse abdominal pain.  Patient reports that he had some "body aches" yesterday and then woke this morning with worsening body aches and specifically abdominal pain.  Patient reports generalized abdominal pain with no region more painful than others.  Patient states that he has had a low-grade fever but no chills.  Patient denies any nasal congestion, sore throat, cough, shortness of breath.  No chest pain.  Patient denies any emesis.  No diarrhea or constipation.  Patient has a long history of recurrent UTIs and states that he has had some darkening of his urine as well as a foul odor to his urine.  Patient does intermittently self cath.  Patient has a history of chronic abdominal pain, recurrent UTIs, angina, BPH, coronary artery disease, hypertension, anemia, MI, Parkinson's, sleep apnea, CVA, diabetes.  Patient states that he took some Tylenol earlier today with no relief of symptoms.         Past Medical History:  Diagnosis Date  . Abdominal pain   . Acute cystitis without hematuria   . Anemia   . Anginal pain ([place])    rarely  . Arthritis    knees and hands  . Asthma   . Bipolar disorder ([place])    BIPOLAR/ SCHIZO/ AFFECTIVE   . BPD (bronchopulmonary dysplasia)   . BPH (benign prostatic hyperplasia)   . CAD (coronary artery disease)    a. s/p MI 2001, 2012;  b. s/p prior LCX stenting; b. 05/2012 Abnl MV; c. 11/2015 Cath: 3VD-->12/2015 CABG x 4 (LIMA->LAD, VG->D1, VG->OM2, VG->RPDA); d. 12/2018 MV: mid-dist inflat ischemia. Basal inf lat infarct. EF 55%.  . Carpal tunnel syndrome    surgery both hands  . Depression   . Diastolic  dysfunction    a. 07/2014 Echo: EF 60-65%, no rwma, nl LA size, nl RV/PASP; b. 12/2018 Echo: EF 50-55%, mod LVH. Inf HK. Impaired relaxation. Mildly red RV fxn. Mildly dil LA. Ao root 9mm.  . Disc degeneration, lumbar   . Dyspnea    WITH EXERTION  . Elevated PSA   . GERD (gastroesophageal reflux disease)   . Hematuria    history of gross and microscopic  . Hemorrhoid   . History of balanitis   . History of bladder infections   . History of urethral stricture   . Hyperlipidemia   . Hypertensive heart disease   . Incomplete bladder emptying   . Iron deficiency anemia, unspecified   . Lower urinary tract symptoms   . Myocardial infarction ([place])    X7 LAST 2001  . Neuropathy   . Nocturia   . Obsessive compulsive disorder   . Other dysphagia   . Panic attack   . Parkinson's disease ([place])   . Sleep apnea    SURGICAL TX  . Stroke ([place])   . Type II diabetes mellitus ([place])   . Unspecified vitamin D deficiency   . Urethral stricture   . Urinary retention   . Varicose vein of leg   . Vertigo    hx of  . Vitamin D deficiency   . Wears dentures    upper and lowers    Patient Active Problem List   Diagnosis Date  Noted  . UTI (urinary tract infection) 01/02/2019  . Polypharmacy 12/21/2018  . Urinary retention 12/21/2018  . Altered mental status 12/10/2018  . Chest pain 09/05/2018  . Iron deficiency anemia due to chronic blood loss   . Benign neoplasm of ascending colon   . Benign neoplasm of cecum   . Gastritis without bleeding   . Parkinsonism due to drug ([place]) 07/22/2016  . Chronic bilateral low back pain without sciatica 04/07/2016  . Syncope 12/31/2015  . Status post coronary artery bypass grafting 12/31/2015  . S/P CABG x 4 12/25/2015  . Unstable angina ([place]) 12/01/2015  . Hypertensive heart disease   . Type II diabetes mellitus ([place])   . Abnormal chest CT 07/10/2015  . Pulmonary nodules 07/09/2015  . Basal cell carcinoma of skin 05/27/2015  . Bipolar disorder  ([place]) 05/27/2015  . CAD (coronary artery disease), native coronary artery 05/27/2015  . DDD (degenerative disc disease), lumbosacral 05/27/2015  . Esophageal reflux 05/27/2015  . Benign essential hypertension 05/27/2015  . Sleep apnea 05/27/2015  . Urethral stricture 05/27/2015  . Benign prostatic hypertrophy (BPH) with incomplete bladder emptying 11/29/2014  . CVA (cerebral infarction) 08/23/2014  . Hypertension   . S/P coronary artery stent placement 09/30/2011  . Hyperlipidemia 09/30/2011  . History of myocardial infarction 09/03/2011  . Seasonal allergies 09/03/2011    Past Surgical History:  Procedure Laterality Date  . BACK SURGERY     lumbar disc  . BACK SURGERY  1984  . CARDIAC CATHETERIZATION  6/10;2011;Aug.2012;Oct. 2012;   . CARDIAC CATHETERIZATION  05/2012   [facility]; EF 60%, patent stent in the left circumflex, moderate three-vessel disease with no flow-limiting lesions. Unchanged from most recent catheterization  . CARDIAC CATHETERIZATION N/A 12/10/2015   Procedure: Left Heart Cath and Coronary Angiography;  Surgeon: Corey Skains, MD;  Location: [place] CV [facility];  Service: Cardiovascular;  Laterality: N/A;  . CARPAL TUNNEL RELEASE  2009   left  . CATARACT EXTRACTION W/PHACO Left 02/14/2017   Procedure: CATARACT EXTRACTION PHACO AND INTRAOCULAR LENS PLACEMENT (IOC);  Surgeon: Birder Robson, MD;  Location: [facility] ORS;  Service: Ophthalmology;  Laterality: Left;  [place] 00:42 AP% 16.6 CDE 7.14 Fluid pack lot # [id number]  . CATARACT EXTRACTION W/PHACO Right 02/28/2017   Procedure: CATARACT EXTRACTION PHACO AND INTRAOCULAR LENS PLACEMENT (IOC);  Surgeon: Birder Robson, MD;  Location: [facility] ORS;  Service: Ophthalmology;  Laterality: Right;  [place] 01:09 AP% 11.4 CDE 7.98 Fluid pack lot # [id number]  . COLONOSCOPY WITH PROPOFOL N/A 06/29/2017   Procedure: COLONOSCOPY WITH PROPOFOL;  Surgeon: [name], MD;  Location: [place];  Service: Endoscopy;  Laterality:  N/A;  Diabetic - oral meds  . CORONARY ANGIOPLASTY  2011 & 2001   s/p stent  . CORONARY ARTERY BYPASS GRAFT     stent placement  . CORONARY ARTERY BYPASS GRAFT N/A 12/25/2015   Procedure: CORONARY ARTERY BYPASS GRAFTING (CABG) x4  , using left internal mammary artery and right leg greater saphenous vein harvested endoscopically LIMA-LAD SVG-OM2 SVG-DIAG SVG-PD;  Surgeon: Melrose Nakayama, MD;  Location: [place];  Service: Open Heart Surgery;  Laterality: N/A;  . CORONARY STENT INTERVENTION N/A 01/15/2019   Procedure: CORONARY STENT INTERVENTION;  Surgeon: Nelva Bush, MD;  Location: [place] CV [facility];  Service: Cardiovascular;  Laterality: N/A;  . [place]  . CYST REMOVAL LEG Right   . ESOPHAGOGASTRODUODENOSCOPY  06/29/2017   Procedure: ESOPHAGOGASTRODUODENOSCOPY (EGD);  Surgeon: [name], MD;  Location: [place];  Service: Endoscopy;;  . LEFT HEART CATH AND CORS/GRAFTS ANGIOGRAPHY Left 01/15/2019   Procedure: LEFT HEART CATH AND CORS/GRAFTS ANGIOGRAPHY;  Surgeon: Nelva Bush, MD;  Location: [place] [facility];  Service: Cardiovascular;  Laterality: Left;  . POLYPECTOMY  06/29/2017   Procedure: POLYPECTOMY INTESTINAL;  Surgeon: [name], MD;  Location: [place];  Service: Endoscopy;;  . SKIN GRAFT    . TEE WITHOUT CARDIOVERSION N/A 12/25/2015   Procedure: TRANSESOPHAGEAL ECHOCARDIOGRAM (TEE);  Surgeon: Melrose Nakayama, MD;  Location: [place];  Service: Open Heart Surgery;  Laterality: N/A;  . TONSILLECTOMY    . UVULOPALATOPHARYNGOPLASTY      Prior to Admission medications   Medication Sig Start Date End Date Taking? Authorizing Provider  acetaminophen (TYLENOL) 500 MG tablet Take 1,000 mg by mouth every 6 (six) hours as needed for moderate pain or headache.     [provider]  aspirin (ASPIRIN 81) 81 MG EC tablet Take 81 mg by mouth daily.     [provider]  atorvastatin (LIPITOR) 40 MG tablet Take 1 tablet (40  mg total) by mouth daily at 6 PM. 12/30/15   Barrett, Erin R, PA-C  buPROPion (WELLBUTRIN XL) 300 MG 24 hr tablet Take 300 mg by mouth daily. noon    [provider]  carvedilol (COREG) 12.5 MG tablet Take 12.5 mg by mouth 2 (two) times daily.     [provider]  cephALEXin (KEFLEX) 500 MG capsule Take 1 capsule (500 mg total) by mouth 4 (four) times daily. 02/03/19   Cuthriell, Charline Bills, PA-C  clopidogrel (PLAVIX) 75 MG tablet Take 1 tablet (75 mg total) by mouth daily with breakfast. 01/17/19   Marrianne Mood D, PA-C  doxepin (SINEQUAN) 25 MG capsule Take 50 mg by mouth at bedtime.     [provider]  fluticasone (FLONASE) 50 MCG/ACT nasal spray Place 2 sprays into the nose daily.  06/19/18   [provider]  gabapentin (NEURONTIN) 300 MG capsule Take 300 mg by mouth daily.     [provider]  hydrALAZINE (APRESOLINE) 50 MG tablet Take 1 tablet (50 mg total) by mouth 3 (three) times daily. 01/08/19   Theora Gianotti, NP  Ipratropium-Albuterol (COMBIVENT RESPIMAT) 20-100 MCG/ACT AERS respimat INHALE ONE PUFF INTO THE LUNGS EVERY 6 HOURS Patient taking differently: Inhale 1 puff into the lungs every 6 (six) hours.  11/21/18   Flora Lipps, MD  isosorbide mononitrate (IMDUR) 60 MG 24 hr tablet Take 1 tablet (60 mg total) by mouth daily. 01/08/19 04/08/19  Theora Gianotti, NP  JANUMET 50-1000 MG tablet Take 1 tablet by mouth 2 (two) times daily. Am and pm 08/02/16   [provider]  lisinopril (PRINIVIL,ZESTRIL) 20 MG tablet Take 20 mg by mouth 2 (two) times daily.     [provider]  LORazepam (ATIVAN) 1 MG tablet Take 1-2 tablets (1-2 mg total) by mouth See admin instructions. Take 1 mg by mouth in the morning and take 2 mg by mouth at bedtime 12/12/18   Fritzi Mandes, MD  nitroGLYCERIN (NITROLINGUAL) 0.4 MG/SPRAY spray Place 1 spray under the tongue every 5 (five) minutes x 3 doses as needed for chest pain.    [provider]  pantoprazole (PROTONIX) 40 MG tablet Take 40 mg by mouth daily.     [provider]  risperidone (RISPERDAL) 1 MG tablet Take 1 tablet (1 mg total) by mouth at bedtime. 12/12/18   Fritzi Mandes, MD  [facility]  160-4.5 MCG/ACT inhaler INHALE TWO PUFFS INTO THE LUNGS TWICE DAILY 12/20/18   Flora Lipps, MD  tamsulosin (FLOMAX) 0.4 MG CAPS capsule Take 1 capsule (0.4 mg total) by mouth daily. 09/26/18   Zara Council A, PA-C  topiramate (TOPAMAX) 100 MG tablet Take 100 mg by mouth daily at 12 noon.     [provider]    Allergies Morphine and related, Synalgos-dc [aspirin-caff-dihydrocodeine], and Synchlor a [chlorpheniramine]  Family History  Problem Relation Age of Onset  . Heart disease Mother   . Heart disease Brother   . Prostate cancer Father   . Heart disease Other   . Heart disease Brother   . Kidney disease Neg Hx   . Kidney cancer Neg Hx   . Bladder Cancer Neg Hx     Social History Social History   Tobacco Use  . Smoking status: Former Smoker    Packs/day: 0.50    Years: 15.00    Pack years: 7.50    Types: Cigarettes    Quit date: 05/18/1999    Years since quitting: 19.7  . Smokeless tobacco: Never Used  . Tobacco comment: quit 17 + years  Substance Use Topics  . Alcohol use: No    Alcohol/week: 0.0 standard drinks  . Drug use: No     Review of Systems  Constitutional: Low-grade fever/chills Eyes: No visual changes. No discharge ENT: No upper respiratory complaints. Cardiovascular: no chest pain. Respiratory: no cough. No SOB. Gastrointestinal: Diffuse abdominal pain.  No nausea, no vomiting.  No diarrhea.  No constipation. Genitourinary: Negative for dysuria. No hematuria.  Positive for foul-smelling and dark urine. Musculoskeletal: Negative for musculoskeletal pain. Skin: Negative for rash, abrasions, lacerations, ecchymosis. Neurological: Negative for headaches, focal weakness or numbness. 10-point ROS otherwise  negative.  ____________________________________________   PHYSICAL EXAM:  VITAL SIGNS: ED Triage Vitals  Enc Vitals Group     BP 02/03/19 1745 (!) 145/88     Pulse Rate 02/03/19 1748 65     Resp 02/03/19 1748 16     Temp 02/03/19 1750 98.6 F (37 C)     Temp Source 02/03/19 1750 Oral     SpO2 02/03/19 1748 99 %     Weight 02/03/19 1752 212 lb (96.2 kg)     Height 02/03/19 1752 6\' 3"  (1.905 m)     Head Circumference --      Peak Flow --      Pain Score 02/03/19 1751 9     Pain Loc --      Pain Edu? --      Excl. in [place]? --      Constitutional: Alert and oriented. Well appearing and in no acute distress. Eyes: Conjunctivae are normal. PERRL. EOMI. Head: Atraumatic. ENT:      Ears: EACs and TMs unremarkable bilaterally.      Nose: No congestion/rhinnorhea.      Mouth/Throat: Mucous membranes are moist.  Oropharynx is nonerythematous and nonedematous.  Uvula is midline. Neck: No stridor.  Neck is supple full range of motion Hematological/Lymphatic/Immunilogical: No cervical lymphadenopathy. Cardiovascular: Normal rate, regular rhythm. Normal S1 and S2.  Good peripheral circulation. Respiratory: Normal respiratory effort without tachypnea or retractions. Lungs CTAB. Good air entry to the bases with no decreased or absent breath sounds. Gastrointestinal: Bowel sounds 4 quadrants.  Soft to palpation all quadrants.  Patient reports diffuse tenderness throughout the abdomen.  There is no area more tender than others.  No rebound tenderness.  No Murphy sign.  No  guarding or rigidity. No palpable masses. No distention. No CVA tenderness. Musculoskeletal: Full range of motion to all extremities. No gross deformities appreciated. Neurologic:  Normal speech and language. No gross focal neurologic deficits are appreciated.  Skin:  Skin is warm, dry and intact. No rash noted. Psychiatric: Mood and affect are normal. Speech and behavior are normal. Patient exhibits appropriate insight and  judgement.   ____________________________________________   LABS (all labs ordered are listed, but only abnormal results are displayed)  Labs Reviewed  COMPREHENSIVE METABOLIC PANEL - Abnormal; Notable for the following components:      Result Value   CO2 21 (*)    AST 14 (*)    All other components within normal limits  CBC - Abnormal; Notable for the following components:   Hemoglobin 12.1 (*)    HCT 37.4 (*)    All other components within normal limits  URINALYSIS, COMPLETE (UACMP) WITH MICROSCOPIC - Abnormal; Notable for the following components:   Color, Urine YELLOW (*)    APPearance TURBID (*)    Protein, ur 30 (*)    Nitrite POSITIVE (*)    Leukocytes,Ua LARGE (*)    WBC, UA >50 (*)    Bacteria, UA RARE (*)    All other components within normal limits  URINE CULTURE  LIPASE, BLOOD  CK  LACTIC ACID, PLASMA  LACTIC ACID, PLASMA   ____________________________________________  EKG   ____________________________________________  RADIOLOGY   No results found.  ____________________________________________    PROCEDURES  Procedure(s) performed:    Procedures    Medications  cefTRIAXone (ROCEPHIN) 1 g in sodium chloride 0.9 % 100 mL IVPB (has no administration in time range)  cephALEXin (KEFLEX) capsule 500 mg (has no administration in time range)  sodium chloride 0.9 % bolus 1,000 mL (0 mLs Intravenous Stopped 02/03/19 2008)     ____________________________________________   INITIAL IMPRESSION / ASSESSMENT AND PLAN / ED COURSE  Pertinent labs & imaging results that were available during my care of the patient were reviewed by me and considered in my medical decision making (see chart for details).  Review of the  [place]RS was performed in accordance of the [place] prior to dispensing any controlled drugs.           Patient's diagnosis is consistent with urinary tract infection.  Patient presented to the emergency department complaining of  low-grade fever and diffuse abdominal pain.  Patient has a history of recurrent UTIs.  Patient stated that he believes his symptoms were UTI.  Differential included gastritis, cholecystitis, pancreatitis, urinary tract infection.  Labs are overall reassuring.  Patient does have findings consistent with urinary tract infection.  Patient does again have a long history of same and urine will be sent for culture..  Patient is given IV Rocephin and initial dose of Keflex here in the emergency department.  Patient will be prescribed Keflex outpatient and is instructed to follow-up with urology as needed.  At this time, no indication for further work-up to include imaging.  Patient is given ED precautions to return to the ED for any worsening or new symptoms.     ____________________________________________  FINAL CLINICAL IMPRESSION(S) / ED DIAGNOSES  Final diagnoses:  Acute cystitis without hematuria      NEW MEDICATIONS STARTED DURING THIS VISIT:  ED Discharge Orders         Ordered    cephALEXin (KEFLEX) 500 MG capsule  4 times daily     02/03/19 2053  This chart was dictated using voice recognition software/Dragon. Despite best efforts to proofread, errors can occur which can change the meaning. Any change was purely unintentional.    Darletta Moll, PA-C 02/03/19 2210    Carrie Mew, MD 02/07/19 [phone]

## 2019-02-04 ENCOUNTER — Telehealth: Payer: Self-pay | Admitting: Urology

## 2019-02-04 NOTE — Telephone Encounter (Signed)
Done

## 2019-02-04 NOTE — Telephone Encounter (Signed)
-----   Message from Nori Riis, PA-C sent at 02/02/2019 10:41 AM EST ----- Regarding: cysto Please cancel his cystoscopy with Dr. Diamantina [place].

## 2019-02-05 ENCOUNTER — Other Ambulatory Visit: Payer: Medicare Other | Admitting: Urology

## 2019-02-06 LAB — URINE CULTURE
Culture: >=100000 — AB
Special Requests: NORMAL

## 2019-02-07 ENCOUNTER — Other Ambulatory Visit: Payer: Medicare Other | Admitting: Urology

## 2019-02-07 NOTE — Progress Notes (Addendum)
Brief Pharmacy Note  Patient is a 68 y/o M with history of re-current UTIs and BPH who presented to [facility] ED 11/8 c/o abdominal pain and low-grade fever. He reports intermittently self-catheterization. UA with pyuria, nitrite positive, rare bacteria c/w UTI. Patient was discharged on cephalexin. Urine culture has resulted >100k colonies/mL E coli (cefazolin sensitive) and >50k colonies/mL Enterobacter cloacae (cefazolin resistant). UA did have some squamous cells which could be indicative of contamination. Spoke with EDP regarding culture result and resistance of Enterobacter to discharge antibiotic with plan to assess patient via telephone and prescribe Bactrim 1 DS BID x 7 days if persistent symptoms despite discharge abx.   Update: Patient was admitted 11/14-11/16 with AMS of unknown etiology. Negative work-up for acute stroke. Antibiotics were not continued on admission due to suspicion for chronic colonization. No further intervention indicated.   [place] Resident 11 February 2019

## 2019-02-08 ENCOUNTER — Other Ambulatory Visit: Payer: Self-pay

## 2019-02-08 ENCOUNTER — Ambulatory Visit (INDEPENDENT_AMBULATORY_CARE_PROVIDER_SITE_OTHER): Payer: Medicare Other | Admitting: Nurse Practitioner

## 2019-02-08 ENCOUNTER — Encounter: Payer: Self-pay | Admitting: Nurse Practitioner

## 2019-02-08 VITALS — BP 140/68 | HR 81 | Temp 97.5°F | Ht 75.0 in | Wt 213.5 lb

## 2019-02-08 DIAGNOSIS — E785 Hyperlipidemia, unspecified: Secondary | ICD-10-CM

## 2019-02-08 DIAGNOSIS — Z23 Encounter for immunization: Secondary | ICD-10-CM | POA: Diagnosis not present

## 2019-02-08 DIAGNOSIS — I10 Essential (primary) hypertension: Secondary | ICD-10-CM

## 2019-02-08 DIAGNOSIS — I200 Unstable angina: Secondary | ICD-10-CM | POA: Diagnosis not present

## 2019-02-08 DIAGNOSIS — I2510 Atherosclerotic heart disease of native coronary artery without angina pectoris: Secondary | ICD-10-CM

## 2019-02-08 MED ORDER — CARVEDILOL 12.5 MG PO TABS: 18.7500 mg | ORAL_TABLET | Freq: Two times a day (BID) | ORAL | 3 refills | Status: AC

## 2019-02-08 NOTE — Progress Notes (Addendum)
Office Visit    Patient Name: Roger Pruitt Date of Encounter: 02/08/2019  Primary Care Provider:  Casilda Carls, MD Primary Cardiologist:  Kate Sable, MD  Chief Complaint    68 year old male with a history of CAD status post four-vessel bypass in 2017, poorly controlled hypertension, remote tobacco abuse, hyperlipidemia, diabetes, depression, and anxiety, who presents for follow-up after recent PCI.  Past Medical History    Past Medical History:  Diagnosis Date   Abdominal pain    Acute cystitis without hematuria    Anemia    Anginal pain ([place])    rarely   Arthritis    knees and hands   Asthma    Bipolar disorder ([place])    BIPOLAR/ SCHIZO/ AFFECTIVE    BPD (bronchopulmonary dysplasia)    BPH (benign prostatic hyperplasia)    CAD (coronary artery disease)    a. s/p MI 2001, 2012;  b. s/p prior LCX PCI; b. 05/2012 Abnl MV; c. 11/2015 Cath: 3VD-->12/2015 CABG x 4 (LIMA->LAD, VG->D1, VG->OM2, VG->RPDA); d. 12/2018 MV: mid-dist inflat ischemia. Basal inf lat infarct. EF 55%; e. 12/2018 PCI: LM nl, LAD 75/132m, 80d, D1 80/90/90 (DES x 3), RI 95, LCX 99ost, 110m, OM1 100, OM2 85, RCA 45p, 40m, 70/30d, RPDA 45ost/p, VG->RPDA 100, VG->D2->OM2 40, LIMA->LAD ok.   Carpal tunnel syndrome    surgery both hands   Depression    Diastolic dysfunction    a. 07/2014 Echo: EF 60-65%, no rwma, nl LA size, nl RV/PASP; b. 12/2018 Echo: EF 50-55%, mod LVH. Inf HK. Impaired relaxation. Mildly red RV fxn. Mildly dil LA. Ao root 17mm.   Disc degeneration, lumbar    Dyspnea    WITH EXERTION   Elevated PSA    GERD (gastroesophageal reflux disease)    Hematuria    history of gross and microscopic   Hemorrhoid    History of balanitis    History of bladder infections    History of urethral stricture    Hyperlipidemia    Hypertensive heart disease    Incomplete bladder emptying    Iron deficiency anemia, unspecified    Lower urinary tract symptoms     Myocardial infarction ([place])    X7 LAST 2001   Neuropathy    Nocturia    Obsessive compulsive disorder    Other dysphagia    Panic attack    Parkinson's disease ([place])    Sleep apnea    SURGICAL TX   Stroke ([place])    Type II diabetes mellitus ([place])    Unspecified vitamin D deficiency    Urethral stricture    Urinary retention    Varicose vein of leg    Vertigo    hx of   Vitamin D deficiency    Wears dentures    upper and lowers   Past Surgical History:  Procedure Laterality Date   BACK SURGERY     lumbar disc   BACK SURGERY  1984   CARDIAC CATHETERIZATION  6/10;2011;Aug.2012;Oct. 2012;    CARDIAC CATHETERIZATION  05/2012   [facility]; EF 60%, patent stent in the left circumflex, moderate three-vessel disease with no flow-limiting lesions. Unchanged from most recent catheterization   CARDIAC CATHETERIZATION N/A 12/10/2015   Procedure: Left Heart Cath and Coronary Angiography;  Surgeon: Corey Skains, MD;  Location: [place] [facility];  Service: Cardiovascular;  Laterality: N/A;   CARPAL TUNNEL RELEASE  2009   left   CATARACT EXTRACTION W/PHACO Left 02/14/2017   Procedure: CATARACT EXTRACTION PHACO  AND INTRAOCULAR LENS PLACEMENT (IOC);  Surgeon: Birder Robson, MD;  Location: [facility];  Service: Ophthalmology;  Laterality: Left;  [place] 00:42 AP% 16.6 CDE 7.14 Fluid pack lot # [id number]   CATARACT EXTRACTION W/PHACO Right 02/28/2017   Procedure: CATARACT EXTRACTION PHACO AND INTRAOCULAR LENS PLACEMENT (IOC);  Surgeon: Birder Robson, MD;  Location: [facility];  Service: Ophthalmology;  Laterality: Right;  [place] 01:09 AP% 11.4 CDE 7.98 Fluid pack lot # [id number]   COLONOSCOPY WITH PROPOFOL N/A 06/29/2017   Procedure: COLONOSCOPY WITH PROPOFOL;  Surgeon: [name], MD;  Location: [place];  Service: Endoscopy;  Laterality: N/A;  Diabetic - oral meds   CORONARY ANGIOPLASTY  2011 & 2001   s/p stent   CORONARY ARTERY BYPASS GRAFT     stent  placement   CORONARY ARTERY BYPASS GRAFT N/A 12/25/2015   Procedure: CORONARY ARTERY BYPASS GRAFTING (CABG) x4  , using left internal mammary artery and right leg greater saphenous vein harvested endoscopically LIMA-LAD SVG-OM2 SVG-DIAG SVG-PD;  Surgeon: Melrose Nakayama, MD;  Location: [place];  Service: Open Heart Surgery;  Laterality: N/A;   CORONARY STENT INTERVENTION N/A 01/15/2019   Procedure: CORONARY STENT INTERVENTION;  Surgeon: Nelva Bush, MD;  Location: [place] [facility];  Service: Cardiovascular;  Laterality: N/A;   COSMETIC SURGERY  1967   CYST REMOVAL LEG Right    ESOPHAGOGASTRODUODENOSCOPY  06/29/2017   Procedure: ESOPHAGOGASTRODUODENOSCOPY (EGD);  Surgeon: [name], MD;  Location: [place];  Service: Endoscopy;;   LEFT HEART CATH AND CORS/GRAFTS ANGIOGRAPHY Left 01/15/2019   Procedure: LEFT HEART CATH AND CORS/GRAFTS ANGIOGRAPHY;  Surgeon: Nelva Bush, MD;  Location: [place] [facility];  Service: Cardiovascular;  Laterality: Left;   POLYPECTOMY  06/29/2017   Procedure: POLYPECTOMY INTESTINAL;  Surgeon: [name], MD;  Location: [place];  Service: Endoscopy;;   SKIN GRAFT     TEE WITHOUT CARDIOVERSION N/A 12/25/2015   Procedure: TRANSESOPHAGEAL ECHOCARDIOGRAM (TEE);  Surgeon: Melrose Nakayama, MD;  Location: [place];  Service: Open Heart Surgery;  Laterality: N/A;   TONSILLECTOMY     UVULOPALATOPHARYNGOPLASTY      Allergies  Allergies  Allergen Reactions   Morphine And Related Nausea And Vomiting   Synalgos-Dc [Aspirin-Caff-Dihydrocodeine] Itching and Other (See Comments)    Reaction:  Stinging    Synchlor A [Chlorpheniramine] Rash and Other (See Comments)    Patient doesn't recall this allergy.    History of Present Illness    68 year old male with above complex past medical history including CAD status post four-vessel bypass in 2017, poorly controlled hypertension, remote tobacco abuse, hyperlipidemia,  diabetes, depression, and anxiety.  He recently established care in our clinic after being followed at [facility] for 3 years.  In September, he reported a 47-month history of progressive exertional chest pain and dyspnea as well as occasional dizziness and unsteady gait.  Stress testing showed mid to distal inferolateral ischemia and he subsequently underwent diagnostic catheterization on October 20 revealing severe, native, multivessel disease with 3 of 4 patent grafts (vein graft to the RPDA was occluded, while the LIMA to the LAD and sequential graft to the second diagonal and second obtuse marginal were patent).  The first diagonal had serial 80 and 90% stenoses in this area was successfully treated with 3 drug-eluting stents.  Post PCI course was complicated by urinary retention and also chest pain.  ECG was nonacute and chest pain resolved.  He was seen by urology with recommendation for Foley catheter placement and  outpatient urology follow-up and cystoscopy.  He was subsequently discharged on October 21.  Since discharge, he has done well.  He has had no recurrence of chest pain or dyspnea.  He was seen in the emergency department on November 8 and diagnosed with cystitis.  He has been feeling better from the standpoint and he has follow-up with urology on November 30.  He denies palpitations, PND, orthopnea, dizziness, syncope, edema, or early satiety.  His blood pressure is moderately elevated today and he does not routinely check this at home.  Home Medications    Prior to Admission medications   Medication Sig Start Date End Date Taking? Authorizing Provider  acetaminophen (TYLENOL) 500 MG tablet Take 1,000 mg by mouth every 6 (six) hours as needed for moderate pain or headache.     [provider]  aspirin (ASPIRIN 81) 81 MG EC tablet Take 81 mg by mouth daily.     [provider]  atorvastatin (LIPITOR) 40 MG tablet Take 1 tablet (40 mg total) by mouth daily at 6 PM.  12/30/15   [place]rett, Erin R, PA-C  buPROPion (WELLBUTRIN XL) 300 MG 24 hr tablet Take 300 mg by mouth daily. noon    [provider]  carvedilol (COREG) 12.5 MG tablet Take 12.5 mg by mouth 2 (two) times daily.     [provider]  cephALEXin (KEFLEX) 500 MG capsule Take 1 capsule (500 mg total) by mouth 4 (four) times daily. 02/03/19   Cuthriell, Charline Bills, PA-C  clopidogrel (PLAVIX) 75 MG tablet Take 1 tablet (75 mg total) by mouth daily with breakfast. 01/17/19   Marrianne Mood D, PA-C  doxepin (SINEQUAN) 25 MG capsule Take 50 mg by mouth at bedtime.     [provider]  fluticasone (FLONASE) 50 MCG/ACT nasal spray Place 2 sprays into the nose daily.  06/19/18   [provider]  gabapentin (NEURONTIN) 300 MG capsule Take 300 mg by mouth daily.     [provider]  hydrALAZINE (APRESOLINE) 50 MG tablet Take 1 tablet (50 mg total) by mouth 3 (three) times daily. 01/08/19   Theora Gianotti, NP  Ipratropium-Albuterol (COMBIVENT RESPIMAT) 20-100 MCG/ACT AERS respimat INHALE ONE PUFF INTO THE LUNGS EVERY 6 HOURS Patient taking differently: Inhale 1 puff into the lungs every 6 (six) hours.  11/21/18   Flora Lipps, MD  isosorbide mononitrate (IMDUR) 60 MG 24 hr tablet Take 1 tablet (60 mg total) by mouth daily. 01/08/19 04/08/19  Theora Gianotti, NP  JANUMET 50-1000 MG tablet Take 1 tablet by mouth 2 (two) times daily. Am and pm 08/02/16   [provider]  lisinopril (PRINIVIL,ZESTRIL) 20 MG tablet Take 20 mg by mouth 2 (two) times daily.     [provider]  LORazepam (ATIVAN) 1 MG tablet Take 1-2 tablets (1-2 mg total) by mouth See admin instructions. Take 1 mg by mouth in the morning and take 2 mg by mouth at bedtime 12/12/18   Fritzi Mandes, MD  nitroGLYCERIN (NITROLINGUAL) 0.4 MG/SPRAY spray Place 1 spray under the tongue every 5 (five) minutes x 3 doses as needed for chest pain.    [provider]  pantoprazole  (PROTONIX) 40 MG tablet Take 40 mg by mouth daily.     [provider]  risperidone (RISPERDAL) 1 MG tablet Take 1 tablet (1 mg total) by mouth at bedtime. 12/12/18   Fritzi Mandes, MD  SYMBICORT 160-4.5 MCG/ACT inhaler INHALE TWO PUFFS INTO THE LUNGS TWICE DAILY 12/20/18  Flora Lipps, MD  tamsulosin (FLOMAX) 0.4 MG CAPS capsule Take 1 capsule (0.4 mg total) by mouth daily. 09/26/18   Zara Council A, PA-C  topiramate (TOPAMAX) 100 MG tablet Take 100 mg by mouth daily at 12 noon.     [provider]    Review of Systems    Doing well post PCI. He denies chest pain, palpitations, dyspnea, pnd, orthopnea, n, v, dizziness, syncope, edema, weight gain, or early satiety.  All other systems reviewed and are otherwise negative except as noted above.  Physical Exam    VS:  BP 140/68 (BP Location: Left Arm, Patient Position: Sitting, Cuff Size: Normal)    Pulse 81    Temp (!) 97.5 F (36.4 C)    Ht 6\' 3"  (1.905 m)    Wt 213 lb 8 oz (96.8 kg)    BMI 26.69 kg/m  , BMI Body mass index is 26.69 kg/m. GEN: Well nourished, well developed, in no acute distress. HEENT: normal. Neck: Supple, no JVD, carotid bruits, or masses. Cardiac: RRR, 2/6 systolic murmur heard throughout, no rubs, or gallops. No clubbing, cyanosis, edema.  Radials/PT 2+ and equal bilaterally.  Left radial catheterization site without bleeding, bruit, or hematoma. Respiratory:  Respirations regular and unlabored, clear to auscultation bilaterally. GI: Soft, nontender, nondistended, BS + x 4. MS: no deformity or atrophy. Skin: warm and dry, no rash. Neuro:  Strength and sensation are intact. Psych: Flat affect.  Accessory Clinical Findings    ECG personally reviewed by me today -regular sinus rhythm, 81, left axis deviation, left anterior fascicular block left atrial enlargement- no acute changes.  Lab Results  Component Value Date   WBC 9.1 02/03/2019   HGB 12.1 (L) 02/03/2019   HCT 37.4 (L) 02/03/2019   MCV  87.6 02/03/2019   PLT 215 02/03/2019   Lab Results  Component Value Date   CREATININE 0.98 02/03/2019   BUN 22 02/03/2019   NA 138 02/03/2019   K 3.5 02/03/2019   CL 107 02/03/2019   CO2 21 (L) 02/03/2019   Lab Results  Component Value Date   ALT 11 02/03/2019   AST 14 (L) 02/03/2019   ALKPHOS 53 02/03/2019   BILITOT 1.2 02/03/2019   Lab Results  Component Value Date   CHOL 91 09/05/2018   HDL 27 (L) 09/05/2018   LDLCALC 52 09/05/2018   TRIG 58 09/05/2018   CHOLHDL 3.4 09/05/2018     Assessment & Plan    1.  Coronary artery disease: Status post prior circumflex stenting and subsequent CABG x4 in 2017.  Recent abnormal stress test in the setting of progressive dyspnea and chest pain with diagnostic catheterization in late October revealing 3 of 4 patent grafts with severe disease in the first diagonal.  This was successfully treated with 3 drug-eluting stents.  Since his PCI, he has not had any chest pain or dyspnea.  He remains on aspirin, statin, beta-blocker, Plavix, nitrate, and ACE inhibitor therapy.  I have encouraged him to consider cardiac rehabilitation.  2.  Essential hypertension: Blood pressure elevated at 140/68 today.  He does not routinely check this at home.  This is an improvement since his last visit when he was 180/90.  I am going to increase his carvedilol to 18.75 mg twice daily and he otherwise remains on ACE inhibitor, nitrate, hydralazine.  3.  Hyperlipidemia: LDL 52 in June.  Normal LFTs at that time.  Continue statin therapy.  4.  Type 2 diabetes mellitus: A1c 5.7  in September.  He remains on oral therapy and is followed by primary care.   5.  Disposition: Patient is doing well and will follow up in clinic in 2 to 3 months or sooner if necessary.  Murray Hodgkins, NP 02/08/2019, 5:05 PM

## 2019-02-08 NOTE — Patient Instructions (Signed)
Medication Instructions:  1- INCREASE Coreg Take 1.5 tablets (18.75 mg total) by mouth 2 (two) times daily. *If you need a refill on your cardiac medications before your next appointment, please call your pharmacy*  Lab Work: None ordered  If you have labs (blood work) drawn today and your tests are completely normal, you will receive your results only by: Marland Kitchen MyChart Message (if you have MyChart) OR . A paper copy in the mail If you have any lab test that is abnormal or we need to change your treatment, we will call you to review the results.  Testing/Procedures: 1- You received your flu shot today!  Follow-Up: At [facility], you and your health needs are our priority.  As part of our continuing mission to provide you with exceptional heart care, we have created designated Provider Care Teams.  These Care Teams include your primary Cardiologist (physician) and Advanced Practice Providers (APPs -  Physician Assistants and Nurse Practitioners) who all work together to provide you with the care you need, when you need it.  Your next appointment:   2 month  The format for your next appointment:   In Person  Provider:    You may see Kate Sable, MD or Murray Hodgkins, NP.

## 2019-02-09 ENCOUNTER — Other Ambulatory Visit: Payer: Self-pay

## 2019-02-09 ENCOUNTER — Inpatient Hospital Stay
Admission: EM | Admit: 2019-02-09 | Discharge: 2019-02-11 | DRG: 092 | Disposition: A | Discharge status: Home Health | Payer: Medicare Other | Attending: Internal Medicine | Admitting: Internal Medicine

## 2019-02-09 ENCOUNTER — Observation Stay: Payer: Medicare Other

## 2019-02-09 ENCOUNTER — Emergency Department: Payer: Medicare Other

## 2019-02-09 DIAGNOSIS — Z955 Presence of coronary angioplasty implant and graft: Secondary | ICD-10-CM

## 2019-02-09 DIAGNOSIS — T50905A Adverse effect of unspecified drugs, medicaments and biological substances, initial encounter: Secondary | ICD-10-CM | POA: Diagnosis present

## 2019-02-09 DIAGNOSIS — Z7982 Long term (current) use of aspirin: Secondary | ICD-10-CM

## 2019-02-09 DIAGNOSIS — I2510 Atherosclerotic heart disease of native coronary artery without angina pectoris: Secondary | ICD-10-CM | POA: Diagnosis present

## 2019-02-09 DIAGNOSIS — E785 Hyperlipidemia, unspecified: Secondary | ICD-10-CM | POA: Diagnosis present

## 2019-02-09 DIAGNOSIS — E119 Type 2 diabetes mellitus without complications: Secondary | ICD-10-CM

## 2019-02-09 DIAGNOSIS — R471 Dysarthria and anarthria: Secondary | ICD-10-CM | POA: Diagnosis present

## 2019-02-09 DIAGNOSIS — J45909 Unspecified asthma, uncomplicated: Secondary | ICD-10-CM | POA: Diagnosis not present

## 2019-02-09 DIAGNOSIS — G4710 Hypersomnia, unspecified: Secondary | ICD-10-CM | POA: Diagnosis present

## 2019-02-09 DIAGNOSIS — R29703 NIHSS score 3: Secondary | ICD-10-CM | POA: Diagnosis present

## 2019-02-09 DIAGNOSIS — E1159 Type 2 diabetes mellitus with other circulatory complications: Secondary | ICD-10-CM | POA: Diagnosis present

## 2019-02-09 DIAGNOSIS — I252 Old myocardial infarction: Secondary | ICD-10-CM

## 2019-02-09 DIAGNOSIS — Z9842 Cataract extraction status, left eye: Secondary | ICD-10-CM

## 2019-02-09 DIAGNOSIS — N390 Urinary tract infection, site not specified: Secondary | ICD-10-CM | POA: Diagnosis not present

## 2019-02-09 DIAGNOSIS — Z20828 Contact with and (suspected) exposure to other viral communicable diseases: Secondary | ICD-10-CM | POA: Diagnosis present

## 2019-02-09 DIAGNOSIS — Z951 Presence of aortocoronary bypass graft: Secondary | ICD-10-CM

## 2019-02-09 DIAGNOSIS — R531 Weakness: Secondary | ICD-10-CM | POA: Diagnosis not present

## 2019-02-09 DIAGNOSIS — G9340 Encephalopathy, unspecified: Secondary | ICD-10-CM | POA: Diagnosis present

## 2019-02-09 DIAGNOSIS — E876 Hypokalemia: Secondary | ICD-10-CM | POA: Diagnosis present

## 2019-02-09 DIAGNOSIS — G4730 Sleep apnea, unspecified: Secondary | ICD-10-CM | POA: Diagnosis present

## 2019-02-09 DIAGNOSIS — R338 Other retention of urine: Secondary | ICD-10-CM | POA: Diagnosis present

## 2019-02-09 DIAGNOSIS — Z87440 Personal history of urinary (tract) infections: Secondary | ICD-10-CM

## 2019-02-09 DIAGNOSIS — F429 Obsessive-compulsive disorder, unspecified: Secondary | ICD-10-CM | POA: Diagnosis present

## 2019-02-09 DIAGNOSIS — Z86010 Personal history of colonic polyps: Secondary | ICD-10-CM

## 2019-02-09 DIAGNOSIS — Z9841 Cataract extraction status, right eye: Secondary | ICD-10-CM

## 2019-02-09 DIAGNOSIS — Z961 Presence of intraocular lens: Secondary | ICD-10-CM | POA: Diagnosis present

## 2019-02-09 DIAGNOSIS — Z03818 Encounter for observation for suspected exposure to other biological agents ruled out: Secondary | ICD-10-CM | POA: Diagnosis not present

## 2019-02-09 DIAGNOSIS — Z888 Allergy status to other drugs, medicaments and biological substances status: Secondary | ICD-10-CM

## 2019-02-09 DIAGNOSIS — G92 Toxic encephalopathy: Secondary | ICD-10-CM | POA: Diagnosis not present

## 2019-02-09 DIAGNOSIS — Z91048 Other nonmedicinal substance allergy status: Secondary | ICD-10-CM

## 2019-02-09 DIAGNOSIS — Z8673 Personal history of transient ischemic attack (TIA), and cerebral infarction without residual deficits: Secondary | ICD-10-CM

## 2019-02-09 DIAGNOSIS — R5381 Other malaise: Secondary | ICD-10-CM | POA: Diagnosis not present

## 2019-02-09 DIAGNOSIS — F418 Other specified anxiety disorders: Secondary | ICD-10-CM | POA: Diagnosis present

## 2019-02-09 DIAGNOSIS — R4781 Slurred speech: Secondary | ICD-10-CM | POA: Diagnosis not present

## 2019-02-09 DIAGNOSIS — F319 Bipolar disorder, unspecified: Secondary | ICD-10-CM | POA: Diagnosis not present

## 2019-02-09 DIAGNOSIS — R29818 Other symptoms and signs involving the nervous system: Secondary | ICD-10-CM | POA: Diagnosis not present

## 2019-02-09 DIAGNOSIS — Z87891 Personal history of nicotine dependence: Secondary | ICD-10-CM

## 2019-02-09 DIAGNOSIS — Z7951 Long term (current) use of inhaled steroids: Secondary | ICD-10-CM

## 2019-02-09 DIAGNOSIS — R404 Transient alteration of awareness: Secondary | ICD-10-CM | POA: Diagnosis not present

## 2019-02-09 DIAGNOSIS — Z79899 Other long term (current) drug therapy: Secondary | ICD-10-CM

## 2019-02-09 DIAGNOSIS — E1169 Type 2 diabetes mellitus with other specified complication: Secondary | ICD-10-CM | POA: Diagnosis present

## 2019-02-09 DIAGNOSIS — N401 Enlarged prostate with lower urinary tract symptoms: Secondary | ICD-10-CM | POA: Diagnosis present

## 2019-02-09 DIAGNOSIS — G2119 Other drug induced secondary parkinsonism: Secondary | ICD-10-CM

## 2019-02-09 DIAGNOSIS — Z7902 Long term (current) use of antithrombotics/antiplatelets: Secondary | ICD-10-CM

## 2019-02-09 DIAGNOSIS — M5136 Other intervertebral disc degeneration, lumbar region: Secondary | ICD-10-CM | POA: Diagnosis present

## 2019-02-09 DIAGNOSIS — K219 Gastro-esophageal reflux disease without esophagitis: Secondary | ICD-10-CM | POA: Diagnosis present

## 2019-02-09 DIAGNOSIS — R4182 Altered mental status, unspecified: Secondary | ICD-10-CM

## 2019-02-09 DIAGNOSIS — Z8249 Family history of ischemic heart disease and other diseases of the circulatory system: Secondary | ICD-10-CM

## 2019-02-09 DIAGNOSIS — E559 Vitamin D deficiency, unspecified: Secondary | ICD-10-CM | POA: Diagnosis present

## 2019-02-09 DIAGNOSIS — I152 Hypertension secondary to endocrine disorders: Secondary | ICD-10-CM | POA: Diagnosis present

## 2019-02-09 DIAGNOSIS — G20 Parkinson's disease: Secondary | ICD-10-CM | POA: Diagnosis present

## 2019-02-09 DIAGNOSIS — Z885 Allergy status to narcotic agent status: Secondary | ICD-10-CM

## 2019-02-09 LAB — CBC WITH DIFFERENTIAL/PLATELET
Abs Immature Granulocytes: 0.03 10*3/uL (ref 0.00–0.07)
Basophils Absolute: 0 10*3/uL (ref 0.0–0.1)
Basophils Relative: 1 %
Eosinophils Absolute: 0.2 10*3/uL (ref 0.0–0.5)
Eosinophils Relative: 4 %
HCT: 32 % — ABNORMAL LOW (ref 39.0–52.0)
Hemoglobin: 10.3 g/dL — ABNORMAL LOW (ref 13.0–17.0)
Immature Granulocytes: 1 %
Lymphocytes Relative: 23 %
Lymphs Abs: 1.2 10*3/uL (ref 0.7–4.0)
MCH: 27.9 pg (ref 26.0–34.0)
MCHC: 32.2 g/dL (ref 30.0–36.0)
MCV: 86.7 fL (ref 80.0–100.0)
Monocytes Absolute: 0.4 10*3/uL (ref 0.1–1.0)
Monocytes Relative: 8 %
Neutro Abs: 3.4 10*3/uL (ref 1.7–7.7)
Neutrophils Relative %: 63 %
Platelets: 187 10*3/uL (ref 150–400)
RBC: 3.69 MIL/uL — ABNORMAL LOW (ref 4.22–5.81)
RDW: 13.9 % (ref 11.5–15.5)
WBC: 5.3 10*3/uL (ref 4.0–10.5)
nRBC: 0 % (ref 0.0–0.2)

## 2019-02-09 LAB — APTT: aPTT: 32 seconds (ref 24–36)

## 2019-02-09 LAB — COMPREHENSIVE METABOLIC PANEL
ALT: 10 U/L (ref 0–44)
AST: 11 U/L — ABNORMAL LOW (ref 15–41)
Albumin: 3.3 g/dL — ABNORMAL LOW (ref 3.5–5.0)
Alkaline Phosphatase: 54 U/L (ref 38–126)
Anion gap: 6 (ref 5–15)
BUN: 22 mg/dL (ref 8–23)
CO2: 23 mmol/L (ref 22–32)
Calcium: 8.5 mg/dL — ABNORMAL LOW (ref 8.9–10.3)
Chloride: 111 mmol/L (ref 98–111)
Creatinine, Ser: 0.9 mg/dL (ref 0.61–1.24)
GFR calc Af Amer: >60 mL/min (ref >60)
GFR calc non Af Amer: >60 mL/min (ref >60)
Glucose, Bld: 91 mg/dL (ref 70–99)
Potassium: 3.3 mmol/L — ABNORMAL LOW (ref 3.5–5.1)
Sodium: 140 mmol/L (ref 135–145)
Total Bilirubin: 0.6 mg/dL (ref 0.3–1.2)
Total Protein: 5.8 g/dL — ABNORMAL LOW (ref 6.5–8.1)

## 2019-02-09 LAB — URINALYSIS, COMPLETE (UACMP) WITH MICROSCOPIC
Bilirubin Urine: NEGATIVE
Glucose, UA: NEGATIVE mg/dL
Hgb urine dipstick: NEGATIVE
Ketones, ur: NEGATIVE mg/dL
Nitrite: NEGATIVE
Protein, ur: 30 mg/dL — AB
Specific Gravity, Urine: 1.018 (ref 1.005–1.030)
WBC, UA: >50 WBC/hpf — ABNORMAL HIGH (ref 0–5)
pH: 5 (ref 5.0–8.0)

## 2019-02-09 LAB — URINE DRUG SCREEN, QUALITATIVE (ARMC ONLY)
Amphetamines, Ur Screen: NOT DETECTED
Barbiturates, Ur Screen: NOT DETECTED
Benzodiazepine, Ur Scrn: POSITIVE — AB
Cannabinoid 50 Ng, Ur ~~LOC~~: NOT DETECTED
Cocaine Metabolite,Ur ~~LOC~~: NOT DETECTED
MDMA (Ecstasy)Ur Screen: NOT DETECTED
Methadone Scn, Ur: NOT DETECTED
Opiate, Ur Screen: NOT DETECTED
Phencyclidine (PCP) Ur S: NOT DETECTED
Tricyclic, Ur Screen: POSITIVE — AB

## 2019-02-09 LAB — AMMONIA: Ammonia: 20 umol/L (ref 9–35)

## 2019-02-09 LAB — LACTIC ACID, PLASMA: Lactic Acid, Venous: 1 mmol/L (ref 0.5–1.9)

## 2019-02-09 LAB — ETHANOL: Alcohol, Ethyl (B): <10 mg/dL (ref <10)

## 2019-02-09 LAB — PROTIME-INR
INR: 1.1 (ref 0.8–1.2)
Prothrombin Time: 14.3 seconds (ref 11.4–15.2)

## 2019-02-09 LAB — MAGNESIUM: Magnesium: 1.7 mg/dL (ref 1.7–2.4)

## 2019-02-09 LAB — GLUCOSE, CAPILLARY: Glucose-Capillary: 83 mg/dL (ref 70–99)

## 2019-02-09 MED ORDER — ATORVASTATIN CALCIUM 20 MG PO TABS
40.0000 mg | ORAL_TABLET | Freq: Every day | ORAL | Status: DC
  Administered 2019-02-10: 40 mg via ORAL
  Filled 2019-02-09 (×3): qty 2

## 2019-02-09 MED ORDER — ENOXAPARIN SODIUM 40 MG/0.4ML ~~LOC~~ SOLN
40.0000 mg | SUBCUTANEOUS | Status: DC
  Administered 2019-02-10 – 2019-02-11 (×2): 40 mg via SUBCUTANEOUS
  Filled 2019-02-09 (×2): qty 0.4

## 2019-02-09 MED ORDER — POTASSIUM CHLORIDE IN NACL 40-0.9 MEQ/L-% IV SOLN
INTRAVENOUS | Status: AC
  Administered 2019-02-09: 100 mL/h via INTRAVENOUS
  Filled 2019-02-09: qty 1000

## 2019-02-09 MED ORDER — STROKE: EARLY STAGES OF RECOVERY BOOK
Freq: Once | Status: AC
  Administered 2019-02-10: 17:00:00

## 2019-02-09 MED ORDER — ACETAMINOPHEN 325 MG PO TABS: 650.0000 mg | ORAL_TABLET | ORAL | Status: DC | PRN

## 2019-02-09 MED ORDER — SODIUM CHLORIDE 0.9 % IV SOLN
1.0000 g | Freq: Once | INTRAVENOUS | Status: AC
  Administered 2019-02-09: 22:00:00 1 g via INTRAVENOUS
  Filled 2019-02-09: qty 10

## 2019-02-09 MED ORDER — MAGNESIUM SULFATE 2 GM/50ML IV SOLN
2.0000 g | Freq: Once | INTRAVENOUS | Status: AC
  Administered 2019-02-09: 2 g via INTRAVENOUS
  Filled 2019-02-09: qty 50

## 2019-02-09 MED ORDER — SODIUM CHLORIDE 0.9 % IV SOLN: INTRAVENOUS | Status: DC

## 2019-02-09 MED ORDER — ACETAMINOPHEN 160 MG/5ML PO SOLN
650.0000 mg | ORAL | Status: DC | PRN
  Filled 2019-02-09: qty 20.3

## 2019-02-09 MED ORDER — ASPIRIN EC 81 MG PO TBEC
81.0000 mg | DELAYED_RELEASE_TABLET | Freq: Every day | ORAL | Status: DC
  Administered 2019-02-10 – 2019-02-11 (×2): 81 mg via ORAL
  Filled 2019-02-09 (×2): qty 1

## 2019-02-09 MED ORDER — CLOPIDOGREL BISULFATE 75 MG PO TABS
75.0000 mg | ORAL_TABLET | Freq: Every day | ORAL | Status: DC
  Administered 2019-02-10 – 2019-02-11 (×2): 75 mg via ORAL
  Filled 2019-02-09 (×2): qty 1

## 2019-02-09 MED ORDER — SENNOSIDES-DOCUSATE SODIUM 8.6-50 MG PO TABS
1.0000 | ORAL_TABLET | Freq: Every evening | ORAL | Status: DC | PRN
  Filled 2019-02-09: qty 1

## 2019-02-09 MED ORDER — TAMSULOSIN HCL 0.4 MG PO CAPS
0.4000 mg | ORAL_CAPSULE | Freq: Every day | ORAL | Status: DC
  Administered 2019-02-10 – 2019-02-11 (×2): 0.4 mg via ORAL
  Filled 2019-02-09 (×2): qty 1

## 2019-02-09 MED ORDER — ACETAMINOPHEN 650 MG RE SUPP: 650.0000 mg | RECTAL | Status: DC | PRN

## 2019-02-09 NOTE — ED Notes (Signed)
Spoke to pt's neighbor Malachy Mood who states she is also occasionally his caregiver (checks on pt, brings him meals) as he does not have any family. Per neighbor, pt was last known well at 1100 this morning, neighbor brought dinner to him around 1700, noticed some confusion and severe slurred speech, and called 911. Neighbor states pt has had CVA before but has no chronic deficits.  Pt is lethargic, answers questions appropriately but falls asleep immediately when not being talked to. Difficult to complete NIH scale d/t AMS.

## 2019-02-09 NOTE — ED Notes (Signed)
Notified by EDP that pt is now code stroke. Pt transported to [place] by [employer], Therapist, sports

## 2019-02-09 NOTE — Consult Note (Signed)
[facility] Tele[facility]urology Consult Services   Date of Service:   02/09/2019 19:04:39  Impression:     .  Rule Out Acute Ischemic Stroke     .  Altered mental status (drowsiness); possible metabolic / toxic encephalopathy  Comments/Sign-Out: 68 year old male with altered mental status, dysarthria, and facial droop. His past medical history is significant for previous stroke (with no chronic deficits), coronary artery disease with coronary angioplasty and stents (12/2018), hypertension, hyperlipidemia, diabetes mellitus. He takes Plavix but is not on anticoagulation.  Mechanism of Stroke: Small Vessel Disease  Metrics: Last Known Well: 02/09/2019 11:00:36 [facility] Notification Time: 02/09/2019 19:04:39 Arrival Time: 02/09/2019 17:54:23 Stamp Time: 02/09/2019 19:04:39 Time First Login Attempt: 02/09/2019 19:07:53 Video Start Time: 02/09/2019 19:07:53  Symptoms: altered mental status, dysarthria, facial droop NIHSS Start Assessment Time: 02/09/2019 19:12:13 Patient is not a candidate for Alteplase/Activase. Patient was not deemed candidate for Alteplase/Activase thrombolytics because of Last Well Known Above 4.5 Hours.  CT head showed no acute hemorrhage or acute core infarct.  Clinical Presentation is not Suggestive of Large Vessel Occlusive Disease  ED Physician notified of diagnostic impression and management plan on 02/09/2019 19:18:55  Our recommendations are outlined below.  Recommendations:     .  Activate Stroke Protocol Admission/Order Set     .  Stroke/Telemetry Floor     .  Neuro Checks     .  Bedside Swallow Eval     .  DVT Prophylaxis     .  IV Fluids, Normal Saline     .  Head of Bed 30 Degrees     .  Euglycemia and Avoid Hyperthermia (PRN Acetaminophen)     .  Antiplatelet Therapy Recommended    Sign Out:     .  Discussed with Emergency Department  Provider    ------------------------------------------------------------------------------  History of Present Illness: Patient is a 68 year old Male.  Patient was brought by EMS for symptoms of altered mental status, dysarthria, facial droop  68 year old male with altered mental status, dysarthria, and facial droop. His past medical history is significant for previous stroke (with no chronic deficits), coronary artery disease with coronary angioplasty and stents (12/2018), hypertension, hyperlipidemia, diabetes mellitus. He takes Plavix but is not on anticoagulation.  Last seen normal was beyond 4.5 hours of presentation. There is no history of hemorrhagic complications or intracranial hemorrhage. There is no history of Recent Anticoagulants. There is no history of recent major surgery. There is no history of recent stroke.      Examination: BP(134/87), Pulse(65), Blood Glucose(833) 1A: Level of Consciousness - Arouses to minor stimulation + 1 1B: Ask Month and Age - Both Questions Right + 0 1C: Blink Eyes & Squeeze Hands - Performs Both Tasks + 0 2: Test Horizontal Extraocular Movements - Normal + 0 3: Test Visual Fields - No Visual Loss + 0 4: Test Facial Palsy (Use Grimace if Obtunded) - Normal symmetry + 0 5A: Test Left Arm Motor Drift - Drift, but doesn't hit bed + 1 5B: Test Right Arm Motor Drift - No Drift for 10 Seconds + 0 6A: Test Left Leg Motor Drift - No Drift for 5 Seconds + 0 6B: Test Right Leg Motor Drift - Drift, but doesn't hit bed + 1 7: Test Limb Ataxia (FNF/Heel-Shin) - No Ataxia + 0 8: Test Sensation - Normal; No sensory loss + 0 9: Test Language/Aphasia - Normal; No aphasia + 0 10: Test Dysarthria - Normal + 0 11:  Test Extinction/Inattention - No abnormality + 0  NIHSS Score: 3  Pre-Morbid Modified Ranking Scale: 0 Points = No symptoms at all  Patient/Family was informed the Neurology Consult would happen via TeleHealth consult by way of interactive  audio and video telecommunications and consented to receiving care in this manner.   Due to the immediate potential for life-threatening deterioration due to underlying acute neurologic illness, I spent 35 minutes providing critical care. This time includes time for face to face visit via telemedicine, review of medical records, imaging studies and discussion of findings with providers, the patient and/or family.   Dr Ellin Goodie   [facility] [phone]  Case [id number]

## 2019-02-09 NOTE — H&P (Signed)
History and Physical    Roger Pruitt ZOX:[id number] DOB: 1950-08-23 DOA: 02/09/2019  PCP: Casilda Carls, MD  Patient coming from: Home  I have personally briefly reviewed patient's old medical records in [place]  Chief Complaint: Slurred speech, altered mental status  HPI: Roger Pruitt is a 68 y.o. male with medical history significant for CAD s/p CABG and DES, type 2 diabetes, hypertension, hyperlipidemia, BPH with urinary retention and recurrent UTIs, and depression with anxiety who presents to the ED for evaluation of slurred speech and altered mental status.  History limited from patient due to hypersomnolence and encephalopathy therefore entirety of history is obtained from EDP, chart review, and patient's neighbor Roger Pruitt and husband who are listed as his contact.  Per patient's neighbors, he was at his usual state of health morning of 02/09/2019.  His neighbor Roger Pruitt who sometimes helps as a caretaker some later in the day and noticed that he was "out of it" and had slurred speech.  They report he has been having issues with urinary retention and self catheterizes at home.  They say he has home health nursing coming once a month but feel that he is requiring more frequent assistance as it is unclear that he is managing to fully care for himself in the interim.  At time of my evaluation on admission, patient is very somnolent and will intermittently and briefly awaken to painful stimuli.  His speech is slurred when attempting to speak 1 or 2 words.  He will follow commands but movement is sluggish in all extremities.  ED Course:  Initial vitals show BP 141/89, pulse 66, RR 18, temp 97.8 Fahrenheit, SPO2 100% on room air.  Labs notable for sodium 140, potassium 3.3, bicarb 23, BUN 22, creatinine 0.9, AST 11, ALT 10, alk phos 54, total bilirubin 0.6, WBC 5.3, hemoglobin 10.3, platelets 187,000, lactic acid 1.0, serum ethanol level undetectable, ammonia 20.  Urinalysis  showed negative nitrites, large leukocytes, 11-20 RBCs, > 50 WBCs, few bacteria on microscopy.  Urine culture was obtained and pending.  UDS was positive for tricyclic's and benzodiazepines (patient is on doxepin and lorazepam as an outpatient).  Blood cultures were obtained and pending.  CT head was negative for acute intracranial infarct or hemorrhage.  Few small remote lacunar infarcts are noted within the bilateral basal ganglia.  Age-related cerebral atrophy with mild chronic small vessel ischemic disease was seen and stable from prior.  Portable chest x-ray showed prior CABG changes, low lung volumes, and airway thickening.  Patient was given IV ceftriaxone.  Teleneurology evaluated patient and recommended further stroke work-up.  The hospitalist service was consulted to admit for further evaluation and management.  Review of Systems:  Unable to obtain full review of systems due to encephalopathy and hypersomnolence.   Past Medical History:  Diagnosis Date  . Abdominal pain   . Acute cystitis without hematuria   . Anemia   . Anginal pain ([place])    rarely  . Arthritis    knees and hands  . Asthma   . Bipolar disorder ([place])    BIPOLAR/ SCHIZO/ AFFECTIVE   . BPD (bronchopulmonary dysplasia)   . BPH (benign prostatic hyperplasia)   . CAD (coronary artery disease)    a. s/p MI 2001, 2012;  b. s/p prior LCX PCI; b. 05/2012 Abnl MV; c. 11/2015 Cath: 3VD-->12/2015 CABG x 4 (LIMA->LAD, VG->D1, VG->OM2, VG->RPDA); d. 12/2018 MV: mid-dist inflat ischemia. Basal inf lat infarct. EF 55%; e. 12/2018 PCI: LM  nl, LAD 75/165m 80d, D1 80/90/90 (DES x 3), RI 95, LCX 99ost, 1040mOM1 100, OM2 85, RCA 45p, 6077m0/30d, RPDA 45ost/p, VG->RPDA 100, VG->D2->OM2 40, LIMA->LAD ok.  . Carpal tunnel syndrome    surgery both hands  . Depression   . Diastolic dysfunction    a. 07/2014 Echo: EF 60-65%, no rwma, nl LA size, nl RV/PASP; b. 12/2018 Echo: EF 50-55%, mod LVH. Inf HK. Impaired relaxation. Mildly red  RV fxn. Mildly dil LA. Ao root 27m34m. Disc degeneration, lumbar   . Dyspnea    WITH EXERTION  . Elevated PSA   . GERD (gastroesophageal reflux disease)   . Hematuria    history of gross and microscopic  . Hemorrhoid   . History of balanitis   . History of bladder infections   . History of urethral stricture   . Hyperlipidemia   . Hypertensive heart disease   . Incomplete bladder emptying   . Iron deficiency anemia, unspecified   . Lower urinary tract symptoms   . Myocardial infarction (HCC)[place] X7 LAST 2001  . Neuropathy   . Nocturia   . Obsessive compulsive disorder   . Other dysphagia   . Panic attack   . Parkinson's disease (HCC)[place]. Sleep apnea    SURGICAL TX  . Stroke (HCC)[place]. Type II diabetes mellitus (HCC)[place]. Unspecified vitamin D deficiency   . Urethral stricture   . Urinary retention   . Varicose vein of leg   . Vertigo    hx of  . Vitamin D deficiency   . Wears dentures    upper and lowers    Past Surgical History:  Procedure Laterality Date  . BACK SURGERY     lumbar disc  . BACK SURGERY  1984  . CARDIAC CATHETERIZATION  6/10;2011;Aug.2012;Oct. 2012;   . CARDIAC CATHETERIZATION  05/2012   [facility]; EF 60%, patent stent in the left circumflex, moderate three-vessel disease with no flow-limiting lesions. Unchanged from most recent catheterization  . CARDIAC CATHETERIZATION N/A 12/10/2015   Procedure: Left Heart Cath and Coronary Angiography;  Surgeon: BrucCorey Skains;  [facility];  Service: Cardiovascular;  Laterality: N/A;  . CARPAL TUNNEL RELEASE  2009   left  . CATARACT EXTRACTION W/PHACO Left 02/14/2017   Procedure: CATARACT EXTRACTION PHACO AND INTRAOCULAR LENS PLACEMENT (IOC);  Surgeon: PorfBirder Robson;  Location: [facility] ORS;  Service: Ophthalmology;  Laterality: Left;  US [id number] AP% 16.6 CDE 7.14 Fluid pack lot # [id number]. CATARACT EXTRACTION W/PHACO Right 02/28/2017   Procedure: CATARACT EXTRACTION PHACO AND  INTRAOCULAR LENS PLACEMENT (IOC);  Surgeon: PorfBirder Robson;  Location: [facility] ORS;  Service: Ophthalmology;  Laterality: Right;  US 0[place]09 AP% 11.4 CDE 7.98 Fluid pack lot # [id number]. COLONOSCOPY WITH PROPOFOL N/A 06/29/2017   Procedure: COLONOSCOPY WITH PROPOFOL;  Surgeon: WohlLucilla Lame;  [facility];  Laterality: N/A;  Diabetic - oral meds  . CORONARY ANGIOPLASTY  2011 & 2001   s/p stent  . CORONARY ARTERY BYPASS GRAFT     stent placement  . CORONARY ARTERY BYPASS GRAFT N/A 12/25/2015   Procedure: CORONARY ARTERY BYPASS GRAFTING (CABG) x4  , using left internal mammary artery and right leg greater saphenous vein harvested endoscopically LIMA-LAD SVG-OM2 SVG-DIAG SVG-PD;  Surgeon: StevMelrose Nakayama;  [facility];  Laterality: N/A;  . CORONARY  STENT INTERVENTION N/A 01/15/2019   Procedure: CORONARY STENT INTERVENTION;  Surgeon: Nelva Bush, MD;  Location: [place] [facility] LAB;  Service: Cardiovascular;  Laterality: N/A;  . [place]  . CYST REMOVAL LEG Right   . ESOPHAGOGASTRODUODENOSCOPY  06/29/2017   Procedure: ESOPHAGOGASTRODUODENOSCOPY (EGD);  Surgeon: [name], MD;  Location: [place];  Service: Endoscopy;;  . LEFT HEART CATH AND CORS/GRAFTS ANGIOGRAPHY Left 01/15/2019   Procedure: LEFT HEART CATH AND CORS/GRAFTS ANGIOGRAPHY;  Surgeon: Nelva Bush, MD;  Location: [place] [facility];  Service: Cardiovascular;  Laterality: Left;  . POLYPECTOMY  06/29/2017   Procedure: POLYPECTOMY INTESTINAL;  Surgeon: [name], MD;  Location: [place];  Service: Endoscopy;;  . SKIN GRAFT    . TEE WITHOUT CARDIOVERSION N/A 12/25/2015   Procedure: TRANSESOPHAGEAL ECHOCARDIOGRAM (TEE);  Surgeon: Melrose Nakayama, MD;  Location: [place];  Service: Open Heart Surgery;  Laterality: N/A;  . TONSILLECTOMY    . UVULOPALATOPHARYNGOPLASTY      Social History:  reports that he  quit smoking about 19 years ago. His smoking use included cigarettes. He has a 7.50 pack-year smoking history. He has never used smokeless tobacco. He reports that he does not drink alcohol or use drugs.  Allergies  Allergen Reactions  . Morphine And Related Nausea And Vomiting  . Synalgos-Dc [Aspirin-Caff-Dihydrocodeine] Itching and Other (See Comments)    Reaction:  Stinging   . Synchlor A [Chlorpheniramine] Rash and Other (See Comments)    Patient doesn't recall this allergy.    Family History  Problem Relation Age of Onset  . Heart disease Mother   . Heart disease Brother   . Prostate cancer Father   . Heart disease Other   . Heart disease Brother   . Kidney disease Neg Hx   . Kidney cancer Neg Hx   . Bladder Cancer Neg Hx      Prior to Admission medications   Medication Sig Start Date End Date Taking? Authorizing Provider  aspirin (ASPIRIN 81) 81 MG EC tablet Take 81 mg by mouth daily.    Yes [provider]  atorvastatin (LIPITOR) 40 MG tablet Take 1 tablet (40 mg total) by mouth daily at 6 PM. 12/30/15  Yes Barrett, Erin R, PA-C  buPROPion (WELLBUTRIN XL) 300 MG 24 hr tablet Take 300 mg by mouth daily. noon   Yes [provider]  carvedilol (COREG) 12.5 MG tablet Take 1.5 tablets (18.75 mg total) by mouth 2 (two) times daily. 02/08/19  Yes Theora Gianotti, NP  cephALEXin (KEFLEX) 500 MG capsule Take 1 capsule (500 mg total) by mouth 4 (four) times daily. 02/03/19  Yes Cuthriell, Charline Bills, PA-C  clopidogrel (PLAVIX) 75 MG tablet Take 1 tablet (75 mg total) by mouth daily with breakfast. 01/17/19  Yes Visser, Jacquelyn D, PA-C  doxepin (SINEQUAN) 25 MG capsule Take 50 mg by mouth at bedtime.    Yes [provider]  fluticasone (FLONASE) 50 MCG/ACT nasal spray Place 2 sprays into the nose daily.  06/19/18  Yes [provider]  gabapentin (NEURONTIN) 300 MG capsule Take 300 mg by mouth daily.    Yes [provider]   hydrALAZINE (APRESOLINE) 50 MG tablet Take 1 tablet (50 mg total) by mouth 3 (three) times daily. 01/08/19  Yes Theora Gianotti, NP  Ipratropium-Albuterol (COMBIVENT RESPIMAT) 20-100 MCG/ACT AERS respimat INHALE ONE PUFF INTO THE LUNGS EVERY 6 HOURS Patient taking differently: Inhale 1 puff into the lungs every 6 (six) hours.  11/21/18  Yes Flora Lipps, MD  isosorbide mononitrate (IMDUR) 60 MG 24 hr tablet Take 1 tablet (60 mg total) by mouth daily. 01/08/19 04/08/19 Yes Theora Gianotti, NP  JANUMET 50-1000 MG tablet Take 1 tablet by mouth 2 (two) times daily. Am and pm 08/02/16  Yes [provider]  lisinopril (PRINIVIL,ZESTRIL) 20 MG tablet Take 20 mg by mouth 2 (two) times daily.    Yes [provider]  LORazepam (ATIVAN) 1 MG tablet Take 1-2 tablets (1-2 mg total) by mouth See admin instructions. Take 1 mg by mouth in the morning and take 2 mg by mouth at bedtime 12/12/18  Yes Fritzi Mandes, MD  pantoprazole (PROTONIX) 40 MG tablet Take 40 mg by mouth daily.    Yes [provider]  risperidone (RISPERDAL) 1 MG tablet Take 1 tablet (1 mg total) by mouth at bedtime. 12/12/18  Yes Fritzi Mandes, MD  SYMBICORT 160-4.5 MCG/ACT inhaler INHALE TWO PUFFS INTO THE LUNGS TWICE DAILY 12/20/18  Yes Flora Lipps, MD  tamsulosin (FLOMAX) 0.4 MG CAPS capsule Take 1 capsule (0.4 mg total) by mouth daily. 09/26/18  Yes McGowan, Larene [place] A, PA-C  topiramate (TOPAMAX) 100 MG tablet Take 100 mg by mouth daily at 12 noon.    Yes [provider]  acetaminophen (TYLENOL) 500 MG tablet Take 1,000 mg by mouth every 6 (six) hours as needed for moderate pain or headache.     [provider]  nitroGLYCERIN (NITROLINGUAL) 0.4 MG/SPRAY spray Place 1 spray under the tongue every 5 (five) minutes x 3 doses as needed for chest pain.    [provider]    Physical Exam: Vitals:   02/09/19 2030 02/09/19 2100 02/09/19 2200 02/09/19 2257  BP: 131/83 121/79 (!) 140/97  (!) 143/96  Pulse: 61 63 62 64  Resp: _0 Temp:    97.6 F (36.4 C)  TempSrc:    Oral  SpO2: 97% 97% 98% 98%  Weight:      Height:       Exam limited due to hypersomnolence. Constitutional: Hypersomnolent, will intermittently awaken to painful stimuli Eyes: PERRL ENMT: Mucous membranes are dry. Posterior pharynx clear of any exudate or lesions. Neck: normal, supple, no masses. Respiratory: clear to auscultation anteriorly. Normal respiratory effort. No accessory muscle use.  Cardiovascular: Regular rate and rhythm, no murmurs / rubs / gallops. No extremity edema. 2+ pedal pulses. Abdomen: no tenderness, no masses palpated. No hepatosplenomegaly. Bowel sounds positive.  Musculoskeletal: no clubbing / cyanosis. No joint deformity upper and lower extremities.  Sluggish movement of extremities. Skin: no rashes, lesions, ulcers. No induration Neurologic: Limited exam due to hypersomnolence, will follow commands and minimally move all extremities.  Left facial droop present.  Speech is slurred.  Tongue protrusion intact.  Finger grip intact bilaterally.  Plantars are downgoing bilaterally. Psychiatric: Hypersomnolent.    Labs on Admission: I have personally reviewed following labs and imaging studies  CBC: Recent Labs  Lab 02/03/19 1758 02/09/19 1832  WBC 9.1 5.3  NEUTROABS  --  3.4  HGB 12.1* 10.3*  HCT 37.4* 32.0*  MCV 87.6 86.7  PLT 215 330   Basic Metabolic Panel: Recent Labs  Lab 02/03/19 1758 02/09/19 1832  NA 138 140  K 3.5 3.3*  CL 107 111  CO2 21* 23  GLUCOSE 96 91  BUN 22 22  CREATININE 0.98 0.90  CALCIUM 9.3 8.5*  MG  --  1.7   GFR: Estimated Creatinine Clearance: 93.9 mL/min (by  C-G formula based on SCr of 0.9 mg/dL). Liver Function Tests: Recent Labs  Lab 02/03/19 1758 02/09/19 1832  AST 14* 11*  ALT 11 10  ALKPHOS 53 54  BILITOT 1.2 0.6  PROT 6.7 5.8*  ALBUMIN 3.5 3.3*   Recent Labs  Lab 02/03/19 1758  LIPASE 20   Recent Labs   Lab 02/09/19 2018  AMMONIA 20   Coagulation Profile: Recent Labs  Lab 02/09/19 1836  INR 1.1   Cardiac Enzymes: Recent Labs  Lab 02/03/19 1758  CKTOTAL 50   BNP (last 3 results) No results for input(s): PROBNP in the last 8760 hours. HbA1C: No results for input(s): HGBA1C in the last 72 hours. CBG: Recent Labs  Lab 02/09/19 1837  GLUCAP 83   Lipid Profile: No results for input(s): CHOL, HDL, LDLCALC, TRIG, CHOLHDL, LDLDIRECT in the last 72 hours. Thyroid Function Tests: No results for input(s): TSH, T4TOTAL, FREET4, T3FREE, THYROIDAB in the last 72 hours. Anemia Panel: No results for input(s): VITAMINB12, FOLATE, FERRITIN, TIBC, IRON, RETICCTPCT in the last 72 hours. Urine analysis:    Component Value Date/Time   COLORURINE YELLOW (A) 02/09/2019 1832   APPEARANCEUR TURBID (A) 02/09/2019 1832   APPEARANCEUR Hazy (A) 01/07/2019 1409   LABSPEC 1.018 02/09/2019 1832   LABSPEC 1.006 02/11/2014 1716   PHURINE 5.0 02/09/2019 1832   GLUCOSEU NEGATIVE 02/09/2019 1832   GLUCOSEU Negative 02/11/2014 1716   HGBUR NEGATIVE 02/09/2019 1832   BILIRUBINUR NEGATIVE 02/09/2019 1832   BILIRUBINUR Negative 01/07/2019 1409   BILIRUBINUR Negative 02/11/2014 [place] 02/09/2019 1832   PROTEINUR 30 (A) 02/09/2019 1832   NITRITE NEGATIVE 02/09/2019 1832   LEUKOCYTESUR LARGE (A) 02/09/2019 1832   LEUKOCYTESUR Negative 02/11/2014 1716    Radiological Exams on Admission: Dg Chest Portable 1 View  Result Date: 02/09/2019 CLINICAL DATA:  Altered mental status. Lethargy. Slurred speech. EXAM: PORTABLE CHEST 1 VIEW COMPARISON:  12/10/2018 FINDINGS: Low lung volumes are present, causing crowding of the pulmonary vasculature. Prior CABG. Accentuation of pulmonary vasculature including the upper lung zones favoring pulmonary venous hypertension. Mild enlargement of the cardiopericardial silhouette. No blunting of the costophrenic angles. Airway thickening is present,  suggesting bronchitis or reactive airways disease. IMPRESSION: 1. Airway thickening is present, suggesting bronchitis or reactive airways disease. 2. Low lung volumes. 3. Mild enlargement of the cardiopericardial silhouette. 4. Accentuation of the pulmonary vasculature including the upper lung zones favoring pulmonary venous hypertension. Electronically Signed   By: Van Clines M.D.   On: 02/09/2019 19:16   Ct Head Code Stroke Wo Contrast`  Result Date: 02/09/2019 CLINICAL DATA:  Code stroke. Initial evaluation for acute slurred speech. EXAM: CT HEAD WITHOUT CONTRAST TECHNIQUE: Contiguous axial images were obtained from the base of the skull through the vertex without intravenous contrast. COMPARISON:  Prior head CT from 01/02/2019. FINDINGS: Brain: Generalized age-related cerebral atrophy with mild chronic small vessel ischemic disease. Few small remote lacunar infarcts noted within the bilateral basal ganglia, stable. No acute intracranial hemorrhage. No acute large vessel territory infarct. No mass lesion, midline shift or mass effect. No hydrocephalus. No extra-axial fluid collection. Vascular: No hyperdense vessel. Scattered vascular calcifications noted within the carotid siphons. Skull: Scalp soft tissues and calvarium within normal limits. Sinuses/Orbits: Globes and orbital soft tissues demonstrate no acute finding. Paranasal sinuses are largely clear. No mastoid effusion. Other: None. ASPECTS [facility] Stroke Program Early CT Score) - Ganglionic level infarction (caudate, lentiform nuclei, internal capsule, insula, M1-M3 cortex): 7 - Supraganglionic infarction (M4-M6 cortex):  3 Total score (0-10 with 10 being normal): 10 IMPRESSION: 1. No acute intracranial infarct or other abnormality identified. 2. ASPECTS is 10. 3. Age-related cerebral atrophy with mild chronic small vessel ischemic disease, stable. Critical Value/emergent results were called by telephone at the time of interpretation on  02/09/2019 at 7:00 pm to [place] , who verbally acknowledged these results. Electronically Signed   By: Jeannine Boga M.D.   On: 02/09/2019 19:01    EKG: Independently reviewed. Sinus rhythm with LVH and IVCD, QTC 477, first-degree AV block.  Not significantly changed when compared to prior.  Assessment/Plan Principal Problem:   Dysarthria Active Problems:   S/P coronary artery stent placement   Hyperlipidemia associated with type 2 diabetes mellitus ([place])   Hypertension associated with diabetes ([place])   Benign prostatic hyperplasia with incomplete bladder emptying   CAD (coronary artery disease), native coronary artery   Type II diabetes mellitus (HCC)   S/P CABG x 4   Altered mental status   Depression with anxiety  Roger Pruitt is a 68 y.o. male with medical history significant for CAD s/p CABG and DES, type 2 diabetes, hypertension, hyperlipidemia, BPH with urinary retention and recurrent UTIs, and depression with anxiety who is admitted with slurred speech/encephalopathy and left facial droop.   Dysarthria/encephalopathy: Patient hypersomnolent on admission with slurred speech and left facial droop.  Teleneurology recommended further stroke work-up.  CT head is negative.  Symptoms may also be secondary to sedating home medications.  Has had multiple similar presentations in the past. -Obtain MRI brain -Carotid Dopplers -Echocardiogram 01/04/2019 showed EF 50-55%, moderate LVH, inferior wall hypokinesis -Continue aspirin, Plavix, atorvastatin -Monitor telemetry, neurochecks -PT/OT/SLP eval -Check A1c, lipid panel -Will hold home doxepin, Wellbutrin, Ativan, Risperdal, topiramate for now -Allow for permissive hypertension pending MRI brain -Hydrate with maintenance IV fluids overnight  BPH with urinary retention and recurrent UTI: Seen in ED on 02/03/2019 and treated for E. Coli/Enterobacter cloacae UTI with oral Keflex.  Repeat UA similar to prior except  nitrates are negative this time.  He was given IV ceftriaxone in the ED.  Will hold further antibiotics since he is likely colonized.  He reportedly intermittently self catheterizes at home. -Continue Flomax -Bladder scan and in and out catheterization as needed  CAD s/p CABG and DES: Continue aspirin, Plavix, atorvastatin.  Holding Coreg and Imdur for now to allow permissive hypertension pending MRI brain.  Hypokalemia: Magnesium 1.7, will replete.  Add potassium to IV fluids.  Type 2 diabetes: Managed with Janumet as an outpatient.  A1c 5.7 on 12/11/2018.  Will hold oral hypoglycemics for now on continue to monitor.  Add sliding scale insulin as needed.  Hypertension: Holding home Coreg, hydralazine, Imdur, lisinopril as above for now to allow for permissive hypertension.  Hyperlipidemia: Continue atorvastatin.  Depression with anxiety: Holding home doxepin, Wellbutrin, Ativan, Risperdal, topiramate for now due to concern for medication induced encephalopathy/hypersomnolence.  DVT prophylaxis: Lovenox Code Status: Full code, patient does not have ability to make or express own decisions at the time of admission.  I did discuss with his neighbors were listed as emergency contact and they have stated he has previously expressed wishes to be a DNR however no formal documentation has been completed.  Will need to reassess once mental status improves. Family Communication: Discussed with patient's neighbor Roger Pruitt [phone] listed as patient contact. Disposition Plan: Pending clinical progress Consults called: Teleneurology Admission status: Observation   Zada Finders MD [facility]  If 7PM-7AM, please contact  night-coverage [email or web address]  02/09/2019, 11:41 PM
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
## 2019-02-09 NOTE — ED Triage Notes (Signed)
Pt arrives via EMS form home d/t AMS- pt is being treated for a UTI x1 week with keflex- pt lethargic but able to answer questions per EMS- pt has gotten 568ml of NS- pt's speech is slurred

## 2019-02-10 ENCOUNTER — Observation Stay: Payer: Medicare Other

## 2019-02-10 ENCOUNTER — Encounter: Payer: Self-pay | Admitting: Internal Medicine

## 2019-02-10 ENCOUNTER — Other Ambulatory Visit: Payer: Self-pay

## 2019-02-10 DIAGNOSIS — I152 Hypertension secondary to endocrine disorders: Secondary | ICD-10-CM | POA: Diagnosis present

## 2019-02-10 DIAGNOSIS — M5136 Other intervertebral disc degeneration, lumbar region: Secondary | ICD-10-CM | POA: Diagnosis present

## 2019-02-10 DIAGNOSIS — Z955 Presence of coronary angioplasty implant and graft: Secondary | ICD-10-CM | POA: Diagnosis not present

## 2019-02-10 DIAGNOSIS — G92 Toxic encephalopathy: Secondary | ICD-10-CM | POA: Diagnosis present

## 2019-02-10 DIAGNOSIS — N390 Urinary tract infection, site not specified: Secondary | ICD-10-CM | POA: Diagnosis present

## 2019-02-10 DIAGNOSIS — I2510 Atherosclerotic heart disease of native coronary artery without angina pectoris: Secondary | ICD-10-CM | POA: Diagnosis not present

## 2019-02-10 DIAGNOSIS — I10 Essential (primary) hypertension: Secondary | ICD-10-CM

## 2019-02-10 DIAGNOSIS — T50905A Adverse effect of unspecified drugs, medicaments and biological substances, initial encounter: Secondary | ICD-10-CM | POA: Diagnosis present

## 2019-02-10 DIAGNOSIS — F418 Other specified anxiety disorders: Secondary | ICD-10-CM | POA: Diagnosis present

## 2019-02-10 DIAGNOSIS — R4182 Altered mental status, unspecified: Secondary | ICD-10-CM

## 2019-02-10 DIAGNOSIS — R4781 Slurred speech: Secondary | ICD-10-CM | POA: Diagnosis not present

## 2019-02-10 DIAGNOSIS — R471 Dysarthria and anarthria: Secondary | ICD-10-CM | POA: Diagnosis not present

## 2019-02-10 DIAGNOSIS — N401 Enlarged prostate with lower urinary tract symptoms: Secondary | ICD-10-CM | POA: Diagnosis present

## 2019-02-10 DIAGNOSIS — G9340 Encephalopathy, unspecified: Secondary | ICD-10-CM | POA: Diagnosis not present

## 2019-02-10 DIAGNOSIS — J45909 Unspecified asthma, uncomplicated: Secondary | ICD-10-CM | POA: Diagnosis present

## 2019-02-10 DIAGNOSIS — E1159 Type 2 diabetes mellitus with other circulatory complications: Secondary | ICD-10-CM

## 2019-02-10 DIAGNOSIS — E876 Hypokalemia: Secondary | ICD-10-CM | POA: Diagnosis present

## 2019-02-10 DIAGNOSIS — F429 Obsessive-compulsive disorder, unspecified: Secondary | ICD-10-CM | POA: Diagnosis present

## 2019-02-10 DIAGNOSIS — E559 Vitamin D deficiency, unspecified: Secondary | ICD-10-CM | POA: Diagnosis present

## 2019-02-10 DIAGNOSIS — E785 Hyperlipidemia, unspecified: Secondary | ICD-10-CM | POA: Diagnosis present

## 2019-02-10 DIAGNOSIS — I6523 Occlusion and stenosis of bilateral carotid arteries: Secondary | ICD-10-CM | POA: Diagnosis not present

## 2019-02-10 DIAGNOSIS — F319 Bipolar disorder, unspecified: Secondary | ICD-10-CM | POA: Diagnosis present

## 2019-02-10 DIAGNOSIS — G4730 Sleep apnea, unspecified: Secondary | ICD-10-CM | POA: Diagnosis present

## 2019-02-10 DIAGNOSIS — E1169 Type 2 diabetes mellitus with other specified complication: Secondary | ICD-10-CM | POA: Diagnosis present

## 2019-02-10 DIAGNOSIS — K219 Gastro-esophageal reflux disease without esophagitis: Secondary | ICD-10-CM | POA: Diagnosis present

## 2019-02-10 DIAGNOSIS — G4710 Hypersomnia, unspecified: Secondary | ICD-10-CM | POA: Diagnosis present

## 2019-02-10 DIAGNOSIS — R29703 NIHSS score 3: Secondary | ICD-10-CM | POA: Diagnosis present

## 2019-02-10 DIAGNOSIS — G20 Parkinson's disease: Secondary | ICD-10-CM | POA: Diagnosis present

## 2019-02-10 DIAGNOSIS — R338 Other retention of urine: Secondary | ICD-10-CM | POA: Diagnosis present

## 2019-02-10 DIAGNOSIS — Z961 Presence of intraocular lens: Secondary | ICD-10-CM | POA: Diagnosis present

## 2019-02-10 DIAGNOSIS — Z20828 Contact with and (suspected) exposure to other viral communicable diseases: Secondary | ICD-10-CM | POA: Diagnosis present

## 2019-02-10 LAB — LIPID PANEL
Cholesterol: 90 mg/dL (ref 0–200)
HDL: 29 mg/dL — ABNORMAL LOW (ref >40)
LDL Cholesterol: 48 mg/dL (ref 0–99)
Total CHOL/HDL Ratio: 3.1 RATIO
Triglycerides: 66 mg/dL (ref <150)
VLDL: 13 mg/dL (ref 0–40)

## 2019-02-10 LAB — GLUCOSE, CAPILLARY: Glucose-Capillary: 72 mg/dL (ref 70–99)

## 2019-02-10 LAB — HEMOGLOBIN A1C
Hgb A1c MFr Bld: 5.5 % (ref 4.8–5.6)
Mean Plasma Glucose: 111.15 mg/dL

## 2019-02-10 LAB — SARS CORONAVIRUS 2 (TAT 6-24 HRS): SARS Coronavirus 2: NEGATIVE

## 2019-02-10 MED ORDER — HYDRALAZINE HCL 50 MG PO TABS
50.0000 mg | ORAL_TABLET | Freq: Three times a day (TID) | ORAL | Status: DC
  Administered 2019-02-10 – 2019-02-11 (×2): 50 mg via ORAL
  Filled 2019-02-10 (×2): qty 1

## 2019-02-10 MED ORDER — IPRATROPIUM-ALBUTEROL 20-100 MCG/ACT IN AERS: 1.0000 | INHALATION_SPRAY | Freq: Four times a day (QID) | RESPIRATORY_TRACT | Status: DC | PRN

## 2019-02-10 MED ORDER — NITROGLYCERIN 0.4 MG SL SUBL: 0.4000 mg | SUBLINGUAL_TABLET | SUBLINGUAL | Status: DC | PRN

## 2019-02-10 MED ORDER — LISINOPRIL 20 MG PO TABS
20.0000 mg | ORAL_TABLET | Freq: Two times a day (BID) | ORAL | Status: DC
  Administered 2019-02-10 – 2019-02-11 (×2): 20 mg via ORAL
  Filled 2019-02-10 (×2): qty 1

## 2019-02-10 MED ORDER — IPRATROPIUM-ALBUTEROL 0.5-2.5 (3) MG/3ML IN SOLN: 3.0000 mL | Freq: Four times a day (QID) | RESPIRATORY_TRACT | Status: DC | PRN

## 2019-02-10 MED ORDER — PANTOPRAZOLE SODIUM 40 MG PO TBEC
40.0000 mg | DELAYED_RELEASE_TABLET | Freq: Every day | ORAL | Status: DC
  Administered 2019-02-11: 40 mg via ORAL
  Filled 2019-02-10: qty 1

## 2019-02-10 MED ORDER — NITROGLYCERIN 0.4 MG/SPRAY TL SOLN: 1.0000 | Status: DC | PRN

## 2019-02-10 MED ORDER — TOPIRAMATE 100 MG PO TABS
100.0000 mg | ORAL_TABLET | Freq: Every day | ORAL | Status: DC
  Administered 2019-02-11: 100 mg via ORAL
  Filled 2019-02-10: qty 1

## 2019-02-10 MED ORDER — GABAPENTIN 300 MG PO CAPS
300.0000 mg | ORAL_CAPSULE | Freq: Every day | ORAL | Status: DC
  Administered 2019-02-11: 300 mg via ORAL
  Filled 2019-02-10: qty 1

## 2019-02-10 MED ORDER — ISOSORBIDE MONONITRATE ER 30 MG PO TB24
60.0000 mg | ORAL_TABLET | Freq: Every day | ORAL | Status: DC
  Administered 2019-02-11: 60 mg via ORAL
  Filled 2019-02-10: qty 2

## 2019-02-10 MED ORDER — CARVEDILOL 3.125 MG PO TABS
18.7500 mg | ORAL_TABLET | Freq: Two times a day (BID) | ORAL | Status: DC
  Administered 2019-02-10 – 2019-02-11 (×2): 18.75 mg via ORAL
  Filled 2019-02-10 (×2): qty 6

## 2019-02-10 NOTE — ED Notes (Signed)
Pt returned from MRI °

## 2019-02-10 NOTE — ED Notes (Signed)
Attempted to call report without success

## 2019-02-10 NOTE — ED Notes (Signed)
Pt in MRI.
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
## 2019-02-10 NOTE — Progress Notes (Signed)
Progress Note    Roger Pruitt  [id number] DOB: 12-30-1950  DOA: 02/09/2019 PCP: Casilda Carls, MD        Assessment/Plan:   Principal Problem:   Dysarthria Active Problems:   S/P coronary artery stent placement   Hyperlipidemia associated with type 2 diabetes mellitus ([place])   Hypertension associated with diabetes ([place])   Benign prostatic hyperplasia with incomplete bladder emptying   CAD (coronary artery disease), native coronary artery   Type II diabetes mellitus ([facility])   S/P CABG x 4   Altered mental status   Depression with anxiety   Encephalopathy   Body mass index is 27.12 kg/m.   Acute dysarthria and encephalopathy: This is probably toxic encephalopathy from medications.  Work-up for stroke was negative.  Continue to monitor on telemetry.  BPH with urinary retention, recurrent UTI: Monitor off of antibiotics for now.  Continue Flomax.  CAD with history of CABG and DES: Continue aspirin, Plavix and Lipitor  Uncontrolled hypertension: Resume home antihypertensives.  Type 2 diabetes mellitus: Janumet on hold  Depression/anxiety: Hold psychotropics for now given somnolence and slurred speech   Family Communication/Anticipated D/C date and plan/Code Status   DVT prophylaxis: Lovenox Code Status: Full code Family Communication: Plan discussed with patient Disposition Plan: Possible discharge to home     Subjective:   No complains at the time of my visit. He thinks he is feeling better.  Objective:    Vitals:   02/10/19 1436 02/10/19 1438 02/10/19 1537 02/10/19 1631  BP:  (!) 180/99 (!) 168/85 (!) 187/99  Pulse: 65 66 69 70  Resp:  14 16 18   Temp:    98.7 F (37.1 C)  TempSrc:    Oral  SpO2:  97% 96% 99%  Weight:      Height:        Intake/Output Summary (Last 24 hours) at 02/10/2019 1711 Last data filed at 02/10/2019 1326 Gross per 24 hour  Intake 395.31 ml  Output --  Net 395.31 ml   Filed Weights   02/09/19 1801   Weight: 98.4 kg    Exam:  GEN: NAD SKIN: No rash. Large scar with contracture on right arm Alden Hipp EYES: EOMI, PERRLA ENT: MMM CV: RRR PULM: CTA B ABD: soft, ND, NT, +BS CNS: Drowsy but arousable, slurred speech, non focal EXT: No edema or tenderness   Data Reviewed:   I have personally reviewed following labs and imaging studies:  Labs: Labs show the following:   Basic Metabolic Panel: Recent Labs  Lab 02/03/19 1758 02/09/19 1832  NA 138 140  K 3.5 3.3*  CL 107 111  CO2 21* 23  GLUCOSE 96 91  BUN 22 22  CREATININE 0.98 0.90  CALCIUM 9.3 8.5*  MG  --  1.7   GFR Estimated Creatinine Clearance: 93.9 mL/min (by C-G formula based on SCr of 0.9 mg/dL). Liver Function Tests: Recent Labs  Lab 02/03/19 1758 02/09/19 1832  AST 14* 11*  ALT 11 10  ALKPHOS 53 54  BILITOT 1.2 0.6  PROT 6.7 5.8*  ALBUMIN 3.5 3.3*   Recent Labs  Lab 02/03/19 1758  LIPASE 20   Recent Labs  Lab 02/09/19 2018  AMMONIA 20   Coagulation profile Recent Labs  Lab 02/09/19 1836  INR 1.1    CBC: Recent Labs  Lab 02/03/19 1758 02/09/19 1832  WBC 9.1 5.3  NEUTROABS  --  3.4  HGB 12.1* 10.3*  HCT 37.4* 32.0*  MCV 87.6 86.7  PLT 215 187   Cardiac Enzymes: Recent Labs  Lab 02/03/19 1758  CKTOTAL 50   BNP (last 3 results) No results for input(s): PROBNP in the last 8760 hours. CBG: Recent Labs  Lab 02/09/19 1837 02/09/19 2255  GLUCAP 83 72   D-Dimer: No results for input(s): DDIMER in the last 72 hours. Hgb A1c: Recent Labs    02/09/19 1832  HGBA1C 5.5   Lipid Profile: Recent Labs    02/09/19 1832  CHOL 90  HDL 29*  LDLCALC 48  TRIG 66  CHOLHDL 3.1   Thyroid function studies: No results for input(s): TSH, T4TOTAL, T3FREE, THYROIDAB in the last 72 hours.  Invalid input(s): FREET3 Anemia work up: No results for input(s): VITAMINB12, FOLATE, FERRITIN, TIBC, IRON, RETICCTPCT in the last 72 hours. Sepsis Labs: Recent Labs  Lab 02/03/19 1758  02/09/19 1832  WBC 9.1 5.3  LATICACIDVEN 1.4 1.0    Microbiology Recent Results (from the past 240 hour(s))  Urine culture     Status: Abnormal   Collection Time: 02/03/19  8:08 PM   Specimen: Urine, Random  Result Value Ref Range Status   Specimen Description   Final    URINE, RANDOM Performed at [facility], [address], [place], [place] [address]    Special Requests   Final    Normal Performed at [facility], [address] [place], [place] [address]    Culture (A)  Final    >=100,000 COLONIES/mL ESCHERICHIA COLI 50,000 COLONIES/mL ENTEROBACTER CLOACAE    Report Status 02/06/2019 FINAL  Final   Organism ID, Bacteria ESCHERICHIA COLI (A)  Final   Organism ID, Bacteria ENTEROBACTER CLOACAE (A)  Final      Susceptibility   Enterobacter cloacae - MIC*    CEFAZOLIN >=64 RESISTANT Resistant     CEFTRIAXONE <=1 SENSITIVE Sensitive     CIPROFLOXACIN <=0.25 SENSITIVE Sensitive     GENTAMICIN <=1 SENSITIVE Sensitive     IMIPENEM <=0.25 SENSITIVE Sensitive     NITROFURANTOIN 64 INTERMEDIATE Intermediate     TRIMETH/SULFA <=20 SENSITIVE Sensitive     PIP/TAZO <=4 SENSITIVE Sensitive     * 50,000 COLONIES/mL ENTEROBACTER CLOACAE   Escherichia coli - MIC*    AMPICILLIN >=32 RESISTANT Resistant     CEFAZOLIN <=4 SENSITIVE Sensitive     CEFTRIAXONE <=1 SENSITIVE Sensitive     CIPROFLOXACIN <=0.25 SENSITIVE Sensitive     GENTAMICIN <=1 SENSITIVE Sensitive     IMIPENEM <=0.25 SENSITIVE Sensitive     NITROFURANTOIN <=16 SENSITIVE Sensitive     TRIMETH/SULFA <=20 SENSITIVE Sensitive     AMPICILLIN/SULBACTAM 16 INTERMEDIATE Intermediate     PIP/TAZO <=4 SENSITIVE Sensitive     Extended ESBL NEGATIVE Sensitive     * >=100,000 COLONIES/mL ESCHERICHIA COLI  SARS CORONAVIRUS 2 (TAT 6-24 HRS) Nasopharyngeal Nasopharyngeal Swab     Status: None   Collection Time: 02/09/19  8:18 PM   Specimen: Nasopharyngeal Swab  Result Value Ref Range Status   SARS  Coronavirus 2 NEGATIVE NEGATIVE Final    Comment: (NOTE) SARS-CoV-2 target nucleic acids are NOT DETECTED. The SARS-CoV-2 RNA is generally detectable in upper and lower respiratory specimens during the acute phase of infection. Negative results do not preclude SARS-CoV-2 infection, do not rule out co-infections with other pathogens, and should not be used as the sole basis for treatment or other patient management decisions. Negative results must be combined with clinical observations, patient history, and epidemiological information. The expected result is Negative. Fact Sheet  for Patients: SugarRoll.be Fact Sheet for Healthcare Providers: https://www.woods-mathews.com/ This test is not yet approved or cleared by the [place] FDA and  has been authorized for detection and/or diagnosis of SARS-CoV-2 by FDA under an Emergency Use Authorization (EUA). This EUA will remain  in effect (meaning this test can be used) for the duration of the COVID-19 declaration under Section 56 4(b)(1) of the Act, 21 U.S.C. section 360bbb-3(b)(1), unless the authorization is terminated or revoked sooner. Performed at [facility], [place] [address], [place], [place] [address]     Procedures and diagnostic studies:  Mr Brain Wo Contrast  Result Date: 02/10/2019 CLINICAL DATA:  Slurred speech EXAM: MRI HEAD WITHOUT CONTRAST TECHNIQUE: Multiplanar, multiecho pulse sequences of the brain and surrounding structures were obtained without intravenous contrast. COMPARISON:  Brain MRI 12/10/2018 FINDINGS: BRAIN: There is no acute infarct, acute hemorrhage or extra-axial collection. Multifocal white matter hyperintensity, most commonly due to chronic ischemic microangiopathy. There are 2 old small vessel infarcts of the right basal ganglia. The cerebral and cerebellar volume are age-appropriate. There is no hydrocephalus. The midline structures are normal. VASCULAR:  The major intracranial arterial and venous sinus flow voids are normal. Susceptibility-sensitive sequences show no chronic microhemorrhage or superficial siderosis. SKULL AND UPPER CERVICAL SPINE: Calvarial bone marrow signal is normal. There is no skull base mass. The visualized upper cervical spine and soft tissues are normal. SINUSES/ORBITS: There are no fluid levels or advanced mucosal thickening. The mastoid air cells and middle ear cavities are free of fluid. The orbits are normal. IMPRESSION: 1. No acute intracranial abnormality. 2. Old small vessel infarcts of the right basal ganglia. Electronically Signed   By: Ulyses Jarred M.D.   On: 02/10/2019 02:46   US Carotid Bilateral (at [facility] And [facility] Only)  Result Date: 02/10/2019 CLINICAL DATA:  Dysarthria. Altered mental status. History of hypertension, CAD (post CABG), hyperlipidemia and diabetes. Former smoker. EXAM: BILATERAL CAROTID DUPLEX ULTRASOUND TECHNIQUE: Pearline Cables scale imaging, color Doppler and duplex ultrasound were performed of bilateral carotid and vertebral arteries in the neck. COMPARISON:  None. FINDINGS: Criteria: Quantification of carotid stenosis is based on velocity parameters that correlate the residual internal carotid diameter with NASCET-based stenosis levels, using the diameter of the distal internal carotid lumen as the denominator for stenosis measurement. The following velocity measurements were obtained: RIGHT ICA: 102/38 cm/sec CCA: [id number] cm/sec SYSTOLIC ICA/CCA RATIO:  1.4 ECA: 121 cm/sec LEFT ICA: 98/41 cm/sec CCA: [id number] cm/sec SYSTOLIC ICA/CCA RATIO:  1.8 ECA: 124 cm/sec RIGHT CAROTID ARTERY: There is a moderate to large amount of eccentric echogenic plaque within the right carotid bulb (image 15), extending to involve the origin and proximal aspects the right internal carotid artery (image 22), not resulting in elevated peak systolic velocities within the interrogated course the right internal carotid artery to suggest a  hemodynamically significant stenosis. RIGHT VERTEBRAL ARTERY:  Antegrade flow LEFT CAROTID ARTERY: There is a minimal amount of eccentric echogenic plaque within the left carotid bulb (image 48), extending to involve the origin and proximal aspect the left internal carotid artery (image 55, not resulting in elevated peak systolic velocities within the interrogated course the left internal carotid artery to suggest a hemodynamically significant stenosis. LEFT VERTEBRAL ARTERY:  Antegrade flow IMPRESSION: 1. Moderate to large amount of right-sided atherosclerotic plaque, not resulting in a hemodynamically significant stenosis. 2. Minimal amount of left-sided atherosclerotic plaque, not resulting in a hemodynamically significant stenosis. Electronically Signed   By: Sandi Mariscal M.D.   On:  02/10/2019 08:53   Dg Chest Portable 1 View  Result Date: 02/09/2019 CLINICAL DATA:  Altered mental status. Lethargy. Slurred speech. EXAM: PORTABLE CHEST 1 VIEW COMPARISON:  12/10/2018 FINDINGS: Low lung volumes are present, causing crowding of the pulmonary vasculature. Prior CABG. Accentuation of pulmonary vasculature including the upper lung zones favoring pulmonary venous hypertension. Mild enlargement of the cardiopericardial silhouette. No blunting of the costophrenic angles. Airway thickening is present, suggesting bronchitis or reactive airways disease. IMPRESSION: 1. Airway thickening is present, suggesting bronchitis or reactive airways disease. 2. Low lung volumes. 3. Mild enlargement of the cardiopericardial silhouette. 4. Accentuation of the pulmonary vasculature including the upper lung zones favoring pulmonary venous hypertension. Electronically Signed   By: Van Clines M.D.   On: 02/09/2019 19:16   Ct Head Code Stroke Wo Contrast`  Result Date: 02/09/2019 CLINICAL DATA:  Code stroke. Initial evaluation for acute slurred speech. EXAM: CT HEAD WITHOUT CONTRAST TECHNIQUE: Contiguous axial images were  obtained from the base of the skull through the vertex without intravenous contrast. COMPARISON:  Prior head CT from 01/02/2019. FINDINGS: Brain: Generalized age-related cerebral atrophy with mild chronic small vessel ischemic disease. Few small remote lacunar infarcts noted within the bilateral basal ganglia, stable. No acute intracranial hemorrhage. No acute large vessel territory infarct. No mass lesion, midline shift or mass effect. No hydrocephalus. No extra-axial fluid collection. Vascular: No hyperdense vessel. Scattered vascular calcifications noted within the carotid siphons. Skull: Scalp soft tissues and calvarium within normal limits. Sinuses/Orbits: Globes and orbital soft tissues demonstrate no acute finding. Paranasal sinuses are largely clear. No mastoid effusion. Other: None. ASPECTS [facility] Stroke Program Early CT Score) - Ganglionic level infarction (caudate, lentiform nuclei, internal capsule, insula, M1-M3 cortex): 7 - Supraganglionic infarction (M4-M6 cortex): 3 Total score (0-10 with 10 being normal): 10 IMPRESSION: 1. No acute intracranial infarct or other abnormality identified. 2. ASPECTS is 10. 3. Age-related cerebral atrophy with mild chronic small vessel ischemic disease, stable. Critical Value/emergent results were called by telephone at the time of interpretation on 02/09/2019 at 7:00 pm to [place] , who verbally acknowledged these results. Electronically Signed   By: Jeannine Boga M.D.   On: 02/09/2019 19:01    Medications:     stroke: mapping our early stages of recovery book   Does not apply Once   aspirin EC  81 mg Oral Daily   atorvastatin  40 mg Oral q1800   clopidogrel  75 mg Oral Q breakfast   enoxaparin (LOVENOX) injection  40 mg Subcutaneous Q24H   tamsulosin  0.4 mg Oral Daily   Continuous Infusions:   LOS: 0 days   Caterra Ostroff  [facility] Pager [phone].   *Please refer to [email or web address], password [email or web address] to get updated  schedule on who will round on this patient, as hospitalists switch teams weekly. If 7PM-7AM, please contact night-coverage at [email or web address], password [email or web address] for any overnight needs.  02/10/2019, 5:11 PM

## 2019-02-11 LAB — GLUCOSE, CAPILLARY
Glucose-Capillary: 88 mg/dL (ref 70–99)
Glucose-Capillary: 105 mg/dL — ABNORMAL HIGH (ref 70–99)

## 2019-02-11 MED ORDER — POTASSIUM CHLORIDE CRYS ER 20 MEQ PO TBCR
40.0000 meq | EXTENDED_RELEASE_TABLET | Freq: Once | ORAL | Status: AC
  Administered 2019-02-11: 09:00:00 40 meq via ORAL
  Filled 2019-02-11: qty 2

## 2019-02-11 NOTE — TOC Initial Note (Signed)
Transition of Care [facility]) - Initial/Assessment Note    Patient Details  Name: Roger Pruitt MRN: [id number] Date of Birth: [birth year]  Transition of Care [facility]) CM/SW Contact:    Shelbie Hutching, RN Phone Number: 02/11/2019, 2:57 PM  Clinical Narrative:                 Patient admitted with dysarthria.  Patient lives at home alone in [place].  Patient reports that he has a friend that is coming to pick him up this afternoon to take him home.  Patient reports that he does drive if needed but not much.  Patient is current with [facility].  Floydene Flock with [facility] notified of admission.  Plan for patient to discharge home today.   Expected Discharge Plan: [place] Barriers to Discharge: No Barriers Identified   Patient Goals and CMS Choice   CMS Medicare.gov Compare Post Acute Care list provided to:: Patient Choice offered to / list presented to : Patient  Expected Discharge Plan and Services Expected Discharge Plan: [place]   Discharge Planning Services: CM Consult Post Acute Care Choice: Resumption of Svcs/PTA Provider Living arrangements for the past 2 months: Mobile Home Expected Discharge Date: 02/11/19                         HH Arranged: RN, PT [facility] Agency: [place] ([place]) Date [place]: 02/11/19 Time [place]: [place] Representative spoke with at [place]: [place] Arrangements/Services Living arrangements for the past 2 months: Mobile Home Lives with:: Self Patient language and need for interpreter reviewed:: Yes Do you feel safe going back to the place where you live?: Yes      Need for Family Participation in Patient Care: Yes (Comment) Care giver support system in place?: No (comment) Current home services: Other (comment)(rollator) Criminal Activity/Legal Involvement Pertinent to Current Situation/Hospitalization: No - Comment as  needed  Activities of Daily Living Home Assistive Devices/Equipment: Shower chair without back, Environmental consultant (specify type)(Rollator) ADL Screening (condition at time of admission) Patient's cognitive ability adequate to safely complete daily activities?: Yes Is the patient deaf or have difficulty hearing?: No Does the patient have difficulty seeing, even when wearing glasses/contacts?: No Does the patient have difficulty concentrating, remembering, or making decisions?: No Patient able to express need for assistance with ADLs?: Yes Does the patient have difficulty dressing or bathing?: Yes Independently performs ADLs?: No Communication: Independent Dressing (OT): Needs assistance Is this a change from baseline?: Pre-admission baseline Grooming: Needs assistance Is this a change from baseline?: Pre-admission baseline Feeding: Independent Bathing: Needs assistance Is this a change from baseline?: Pre-admission baseline Toileting: Needs assistance Is this a change from baseline?: Pre-admission baseline In/Out Bed: Needs assistance Is this a change from baseline?: Pre-admission baseline Walks in Home: Needs assistance Is this a change from baseline?: Pre-admission baseline Does the patient have difficulty walking or climbing stairs?: Yes Weakness of Legs: Both Weakness of Arms/Hands: Right  Permission Sought/Granted Permission sought to share information with : Case Manager, Other (comment) Permission granted to share information with : Yes, Verbal Permission Granted     Permission granted to share info w AGENCY: [facility]        Emotional Assessment Appearance:: Appears stated age Attitude/Demeanor/Rapport: Engaged Affect (typically observed): Accepting, Flat Orientation: : Oriented to Self, Oriented to Place, Oriented to  Time, Oriented to Situation Alcohol /  Substance Use: Not Applicable Psych Involvement: No (comment)  Admission diagnosis:  Dysarthria  [R47.1] Focal neurological deficit [R29.818] Urinary tract infection without hematuria, site unspecified [N39.0] Altered mental status, unspecified altered mental status type [R41.82] Patient Active Problem List   Diagnosis Date Noted  . Encephalopathy 02/10/2019  . Dysarthria 02/09/2019  . Depression with anxiety 02/09/2019  . UTI (urinary tract infection) 01/02/2019  . Polypharmacy 12/21/2018  . Urinary retention 12/21/2018  . Altered mental status 12/10/2018  . Chest pain 09/05/2018  . Iron deficiency anemia due to chronic blood loss   . Benign neoplasm of ascending colon   . Benign neoplasm of cecum   . Gastritis without bleeding   . Parkinsonism due to drug ([place]) 07/22/2016  . Chronic bilateral low back pain without sciatica 04/07/2016  . Syncope 12/31/2015  . Status post coronary artery bypass grafting 12/31/2015  . S/P CABG x 4 12/25/2015  . Unstable angina ([place]) 12/01/2015  . Hypertensive heart disease   . Type II diabetes mellitus ([place])   . Abnormal chest CT 07/10/2015  . Pulmonary nodules 07/09/2015  . Basal cell carcinoma of skin 05/27/2015  . Bipolar disorder ([place]) 05/27/2015  . CAD (coronary artery disease), native coronary artery 05/27/2015  . DDD (degenerative disc disease), lumbosacral 05/27/2015  . Esophageal reflux 05/27/2015  . Benign essential hypertension 05/27/2015  . Sleep apnea 05/27/2015  . Urethral stricture 05/27/2015  . Benign prostatic hyperplasia with incomplete bladder emptying 11/29/2014  . CVA (cerebral infarction) 08/23/2014  . Hypertension associated with diabetes ([place])   . S/P coronary artery stent placement 09/30/2011  . Hyperlipidemia associated with type 2 diabetes mellitus ([place]) 09/30/2011  . History of myocardial infarction 09/03/2011  . Seasonal allergies 09/03/2011   PCP:  Casilda Carls, MD Pharmacy:   [facility] [place] [address] [place] [address] Phone:  [phone] Fax: [place], [place] [facility] [address] [place] [address] Phone: [phone] Fax: [phone]     Social Determinants of Health (SDOH) Interventions    Readmission Risk Interventions Readmission Risk Prevention Plan 01/04/2019 01/04/2019 12/11/2018  Transportation Screening Complete Complete Complete  HRI or Home Care Consult - - Complete  Social Work Consult for [place] Planning/Counseling - - Complete  Palliative Care Screening - - Not Applicable  Medication Review (RN Care Manager) Complete Complete Complete  PCP or Specialist appointment within 3-5 days of discharge Complete Complete -  [place] or Home Care Consult Complete Complete -  Palliative Care Screening Not Applicable Not Applicable -  [place] Not Applicable Patient Refused -  Some recent data might be hidden

## 2019-02-11 NOTE — Progress Notes (Signed)
Patient discharged home with home health per MD order. All discharge instructions given and all questions answered. 

## 2019-02-11 NOTE — Progress Notes (Signed)
SLP Cancellation Note  Patient Details Name: Roger Pruitt MRN: [id number] DOB: 10/26/1950   Cancelled treatment:       Reason Eval/Treat Not Completed: SLP screened, no needs identified, will sign off(chart reviewed; consulted NSG then met w/ pt in room). Pt denied any difficulty swallowing and is currently on a regular diet; tolerates swallowing pills w/ water per NSG. NSG denied any difficulty swallowing. Pt conversed at conversational level w/ SLP and NSG (using call bell to verbally request "help w/ going to the bathroom". No gross cognitive-linguistic deficits noted; pt appears he could have a baseline lateral-lisp like quality to his speech. Pt denied any speech changes and stated his speech "sounds normal to me".  No further skilled ST services indicated as pt appears at his baseline. Pt agreed. NSG to reconsult if any change in status.      Orinda Kenner, MS, CCC-SLP Watson,Katherine 02/11/2019, 10:11 AM
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
## 2019-02-11 NOTE — Progress Notes (Signed)
Physical Therapy Treatment Patient Details Name: Roger Pruitt MRN: [id number] DOB: 08-24-1950 Today's Date: 02/11/2019    History of Present Illness 68 year old male admitted with encephalopathy following c/o slurred speech, AMS.  No infarct evident on imaging so far.  Polypharmacy suspected.  PMH includes PD, CVA, MI, bipolar disorder, asthma and arthritis.    PT Comments    Pt is able to ambulate reasonably well with FWW and minimal cuing. He displayed some minimally ataxic gait but this appears to be at/near baseline (pt confirms this subjectively).  Pt was eager to show what he can do and be able to go home, which he did.  Pt no longer appears to need STR, would benefit from HHPT to insure smooth and safe transition home alone (though he does endorse QD help checking in).   Follow Up Recommendations  Home health PT     Equipment Recommendations  None recommended by PT    Recommendations for Other Services       Precautions / Restrictions Precautions Precautions: Fall Precaution Comments: chair alarm placed after session with OT Restrictions Weight Bearing Restrictions: No    Mobility  Bed Mobility               General bed mobility comments: in recliner on arrival, not tested   Transfers Overall transfer level: Modified independent Equipment used: Rolling walker (2 wheeled);None Transfers: Sit to/from Stand Sit to Stand: Modified independent (Device/Increase time)         General transfer comment: Pt stood from recliner w/o hesitation or safety issues, did not need UEs to maintain balance   Ambulation/Gait Ambulation/Gait assistance: Modified independent (Device/Increase time) Gait Distance (Feet): 250 Feet Assistive device: Rolling walker (2 wheeled)       General Gait Details: Pt able to ambulate around nurses station x2 with consistent (minimally ataxic, very likely baseline) and nearly community appropriate speed. Did have some scissoring with LEs  (especially during turns) but was able to ambulate with good confidence and speed and subjectively reports being close to his baseline.    Stairs             Wheelchair Mobility    Modified Rankin (Stroke Patients Only)       Balance Overall balance assessment: Modified Independent   Sitting balance-Leahy Scale: Normal       Standing balance-Leahy Scale: Good Standing balance comment: able to maintain static standing balance as well as take a few steps without AD (reports he does not have room to use 4WW in his trailer and just uses walls/furniture with no AD)                            Cognition Arousal/Alertness: Awake/alert Behavior During Therapy: WFL for tasks assessed/performed Overall Cognitive Status: Within Functional Limits for tasks assessed                                 General Comments: Mildly impulsive      Exercises      General Comments        Pertinent Vitals/Pain Pain Assessment: No/denies pain    Home Living Family/patient expects to be discharged to:: Private residence Living Arrangements: Alone Available Help at Discharge: Friend(s);Neighbor;Available PRN/intermittently Type of Home: Mobile home Home Access: Ramped entrance   Home Layout: One level Home Equipment: [place] - 4 wheels;Cane - single point;Tub bench  Prior Function Level of Independence: Needs assistance  Gait / Transfers Assistance Needed: Uses SPC and leans on shopping cart for community ambulation.  Drives ADL's / Homemaking Assistance Needed: I "have a lady who comes in" and assists around the home.  "She is supposed to help with meals but I'm usually not hungry". Comments: Indep with RW for ADLs, household and community mobilization; does endorse single fall in recent months.  Has aide 3.5 hours/day, 6 days/week for assist with household responsibilities.   PT Goals (current goals can now be found in the care plan section) Acute Rehab  PT Goals Patient Stated Goal: To return home as soon as possible. Progress towards PT goals: Progressing toward goals    Frequency    Min 2X/week      PT Plan Discharge plan needs to be updated    Co-evaluation              AM-PAC PT "6 Clicks" Mobility   Outcome Measure  Help needed turning from your back to your side while in a flat bed without using bedrails?: None Help needed moving from lying on your back to sitting on the side of a flat bed without using bedrails?: None Help needed moving to and from a bed to a chair (including a wheelchair)?: None Help needed standing up from a chair using your arms (e.g., wheelchair or bedside chair)?: None Help needed to walk in hospital room?: None Help needed climbing 3-5 steps with a railing? : A Little 6 Click Score: 23    End of Session Equipment Utilized During Treatment: Gait belt Activity Tolerance: Patient tolerated treatment well Patient left: with bed alarm set;with call bell/phone within reach Nurse Communication: Mobility status PT Visit Diagnosis: Unsteadiness on feet (R26.81);Repeated falls (R29.6);Muscle weakness (generalized) (M62.81);Difficulty in walking, not elsewhere classified (R26.2)     Time: [id number] PT Time Calculation (min) (ACUTE ONLY): 16 min  Charges:  $Gait Training: 8-22 mins                     Kreg Shropshire, DPT 02/11/2019, 2:15 PM

## 2019-02-11 NOTE — Progress Notes (Signed)
Discontinue stroke protocol orders per Dr Mal Misty

## 2019-02-11 NOTE — Discharge Summary (Signed)
Physician Discharge Summary  Jkai Elfman [id number] DOB: [birth year] DOA: 02/09/2019  PCP: Casilda Carls, MD  Admit date: 02/09/2019 Discharge date: 02/11/2019  Discharge disposition: Home with home health care   Recommendations for Outpatient Follow-Up:   Outpatient follow-up with PCP in 1 week   Discharge Diagnosis:   Principal Problem:   Dysarthria Active Problems:   S/P coronary artery stent placement   Hyperlipidemia associated with type 2 diabetes mellitus ([place])   Hypertension associated with diabetes ([place])   Benign prostatic hyperplasia with incomplete bladder emptying   CAD (coronary artery disease), native coronary artery   Type II diabetes mellitus ([facility])   S/P CABG x 4   Altered mental status   Depression with anxiety   Encephalopathy    Discharge Condition: Stable.  Diet recommendation: Low-salt low sugar diet  Code status: Full code.    Hospital Course:   Mr. Parmeter is a 68 year old man with history of CAD status post CABG and recent placement of drug-eluting stent, remote history of stroke, Parkinson's disease, type II DM, hypertension, hyperlipidemia, BPH, recurrent UTIs, depression, anxiety.  He was brought to the hospital because of slurred speech and altered mental status.  He was admitted to telemetry for monitoring.  Of note, he was evaluated by tele neurologist.  His work-up was negative for acute stroke.  He was on multiple psychotropics that were held on admission.  Etiology of his symptoms is not clear.  However, patient's condition has improved significantly and he is back to his baseline mental status.  He was initially evaluated by PT who recommended SNF at discharge but this was because at the time of evaluation patient was still not back to his baseline.  However, the following day when he was evaluated by this occupational therapist, he was back to his baseline mental status and he did much better at the time of his evaluation.  He  prefers to go back home.  He is deemed medically stable for discharge to home today.  His blood pressure has been uncontrolled and close follow-up with his PCP is strongly recommended. Home health care has been arranged for the patient.      Discharge Exam:   Vitals:   02/11/19 0419 02/11/19 0811  BP: (!) 185/95 (!) 170/90  Pulse: 68 75  Resp:  18  Temp:  98.6 F (37 C)  SpO2:  98%   Vitals:   02/10/19 2340 02/11/19 0418 02/11/19 0419 02/11/19 0811  BP: (!) 145/88 (!) 184/109 (!) 185/95 (!) 170/90  Pulse: 67 67 68 75  Resp: 20 20  18   Temp: 99.1 F (37.3 C) 98.7 F (37.1 C)  98.6 F (37 C)  TempSrc: Oral Oral  Oral  SpO2: 96% 98%  98%  Weight:      Height:         GEN: NAD SKIN: No rash. Scar on right upper extremity with contracture at the elbow joint EYES: EOMI, PERRLA,  ENT: MMM CV: RRR PULM: CTA B ABD: soft, ND, NT, +BS CNS: AAO x 3, no slurred speech, non focal EXT: No edema or tenderness   The results of significant diagnostics from this hospitalization (including imaging, microbiology, ancillary and laboratory) are listed below for reference.     Procedures and Diagnostic Studies:   Mr Brain Wo Contrast  Result Date: 02/10/2019 CLINICAL DATA:  Slurred speech EXAM: MRI HEAD WITHOUT CONTRAST TECHNIQUE: Multiplanar, multiecho pulse sequences of the brain and surrounding structures were obtained without intravenous contrast. COMPARISON:  Brain MRI 12/10/2018 FINDINGS: BRAIN: There is no acute infarct, acute hemorrhage or extra-axial collection. Multifocal white matter hyperintensity, most commonly due to chronic ischemic microangiopathy. There are 2 old small vessel infarcts of the right basal ganglia. The cerebral and cerebellar volume are age-appropriate. There is no hydrocephalus. The midline structures are normal. VASCULAR: The major intracranial arterial and venous sinus flow voids are normal. Susceptibility-sensitive sequences show no chronic  microhemorrhage or superficial siderosis. SKULL AND UPPER CERVICAL SPINE: Calvarial bone marrow signal is normal. There is no skull base mass. The visualized upper cervical spine and soft tissues are normal. SINUSES/ORBITS: There are no fluid levels or advanced mucosal thickening. The mastoid air cells and middle ear cavities are free of fluid. The orbits are normal. IMPRESSION: 1. No acute intracranial abnormality. 2. Old small vessel infarcts of the right basal ganglia. Electronically Signed   By: Ulyses Jarred M.D.   On: 02/10/2019 02:46   US Carotid Bilateral (at [facility] Only)  Result Date: 02/10/2019 CLINICAL DATA:  Dysarthria. Altered mental status. History of hypertension, CAD (post CABG), hyperlipidemia and diabetes. Former smoker. EXAM: BILATERAL CAROTID DUPLEX ULTRASOUND TECHNIQUE: Pearline Cables scale imaging, color Doppler and duplex ultrasound were performed of bilateral carotid and vertebral arteries in the neck. COMPARISON:  None. FINDINGS: Criteria: Quantification of carotid stenosis is based on velocity parameters that correlate the residual internal carotid diameter with NASCET-based stenosis levels, using the diameter of the distal internal carotid lumen as the denominator for stenosis measurement. The following velocity measurements were obtained: RIGHT ICA: 102/38 cm/sec CCA: [id number] cm/sec SYSTOLIC ICA/CCA RATIO:  1.4 ECA: 121 cm/sec LEFT ICA: 98/41 cm/sec CCA: [id number] cm/sec SYSTOLIC ICA/CCA RATIO:  1.8 ECA: 124 cm/sec RIGHT CAROTID ARTERY: There is a moderate to large amount of eccentric echogenic plaque within the right carotid bulb (image 15), extending to involve the origin and proximal aspects the right internal carotid artery (image 22), not resulting in elevated peak systolic velocities within the interrogated course the right internal carotid artery to suggest a hemodynamically significant stenosis. RIGHT VERTEBRAL ARTERY:  Antegrade flow LEFT CAROTID ARTERY: There is a minimal amount of  eccentric echogenic plaque within the left carotid bulb (image 48), extending to involve the origin and proximal aspect the left internal carotid artery (image 55, not resulting in elevated peak systolic velocities within the interrogated course the left internal carotid artery to suggest a hemodynamically significant stenosis. LEFT VERTEBRAL ARTERY:  Antegrade flow IMPRESSION: 1. Moderate to large amount of right-sided atherosclerotic plaque, not resulting in a hemodynamically significant stenosis. 2. Minimal amount of left-sided atherosclerotic plaque, not resulting in a hemodynamically significant stenosis. Electronically Signed   By: Sandi Mariscal M.D.   On: 02/10/2019 08:53   Dg Chest Portable 1 View  Result Date: 02/09/2019 CLINICAL DATA:  Altered mental status. Lethargy. Slurred speech. EXAM: PORTABLE CHEST 1 VIEW COMPARISON:  12/10/2018 FINDINGS: Low lung volumes are present, causing crowding of the pulmonary vasculature. Prior CABG. Accentuation of pulmonary vasculature including the upper lung zones favoring pulmonary venous hypertension. Mild enlargement of the cardiopericardial silhouette. No blunting of the costophrenic angles. Airway thickening is present, suggesting bronchitis or reactive airways disease. IMPRESSION: 1. Airway thickening is present, suggesting bronchitis or reactive airways disease. 2. Low lung volumes. 3. Mild enlargement of the cardiopericardial silhouette. 4. Accentuation of the pulmonary vasculature including the upper lung zones favoring pulmonary venous hypertension. Electronically Signed   By: Van Clines M.D.   On: 02/09/2019 19:16   Ct Head Code Stroke Wo  Contrast`  Result Date: 02/09/2019 CLINICAL DATA:  Code stroke. Initial evaluation for acute slurred speech. EXAM: CT HEAD WITHOUT CONTRAST TECHNIQUE: Contiguous axial images were obtained from the base of the skull through the vertex without intravenous contrast. COMPARISON:  Prior head CT from 01/02/2019.  FINDINGS: Brain: Generalized age-related cerebral atrophy with mild chronic small vessel ischemic disease. Few small remote lacunar infarcts noted within the bilateral basal ganglia, stable. No acute intracranial hemorrhage. No acute large vessel territory infarct. No mass lesion, midline shift or mass effect. No hydrocephalus. No extra-axial fluid collection. Vascular: No hyperdense vessel. Scattered vascular calcifications noted within the carotid siphons. Skull: Scalp soft tissues and calvarium within normal limits. Sinuses/Orbits: Globes and orbital soft tissues demonstrate no acute finding. Paranasal sinuses are largely clear. No mastoid effusion. Other: None. ASPECTS [facility] Stroke Program Early CT Score) - Ganglionic level infarction (caudate, lentiform nuclei, internal capsule, insula, M1-M3 cortex): 7 - Supraganglionic infarction (M4-M6 cortex): 3 Total score (0-10 with 10 being normal): 10 IMPRESSION: 1. No acute intracranial infarct or other abnormality identified. 2. ASPECTS is 10. 3. Age-related cerebral atrophy with mild chronic small vessel ischemic disease, stable. Critical Value/emergent results were called by telephone at the time of interpretation on 02/09/2019 at 7:00 pm to [place] , who verbally acknowledged these results. Electronically Signed   By: Jeannine Boga M.D.   On: 02/09/2019 19:01     Labs:   Basic Metabolic Panel: Recent Labs  Lab 02/09/19 1832  NA 140  K 3.3*  CL 111  CO2 23  GLUCOSE 91  BUN 22  CREATININE 0.90  CALCIUM 8.5*  MG 1.7   GFR Estimated Creatinine Clearance: 93.9 mL/min (by C-G formula based on SCr of 0.9 mg/dL). Liver Function Tests: Recent Labs  Lab 02/09/19 1832  AST 11*  ALT 10  ALKPHOS 54  BILITOT 0.6  PROT 5.8*  ALBUMIN 3.3*   No results for input(s): LIPASE, AMYLASE in the last 168 hours. Recent Labs  Lab 02/09/19 2018  AMMONIA 20   Coagulation profile Recent Labs  Lab 02/09/19 1836  INR 1.1     CBC: Recent Labs  Lab 02/09/19 1832  WBC 5.3  NEUTROABS 3.4  HGB 10.3*  HCT 32.0*  MCV 86.7  PLT 187   Cardiac Enzymes: No results for input(s): CKTOTAL, CKMB, CKMBINDEX, TROPONINI in the last 168 hours. BNP: Invalid input(s): POCBNP CBG: Recent Labs  Lab 02/09/19 1837 02/09/19 2255 02/10/19 2341 02/11/19 0750  GLUCAP 83 72 105* 88   D-Dimer No results for input(s): DDIMER in the last 72 hours. Hgb A1c Recent Labs    02/09/19 1832  HGBA1C 5.5   Lipid Profile Recent Labs    02/09/19 1832  CHOL 90  HDL 29*  LDLCALC 48  TRIG 66  CHOLHDL 3.1   Thyroid function studies No results for input(s): TSH, T4TOTAL, T3FREE, THYROIDAB in the last 72 hours.  Invalid input(s): FREET3 Anemia work up No results for input(s): VITAMINB12, FOLATE, FERRITIN, TIBC, IRON, RETICCTPCT in the last 72 hours. Microbiology Recent Results (from the past 240 hour(s))  Urine culture     Status: Abnormal   Collection Time: 02/03/19  8:08 PM   Specimen: Urine, Random  Result Value Ref Range Status   Specimen Description   Final    URINE, RANDOM Performed at [facility], [address] [place], [place] [address]    Special Requests   Final    Normal Performed at [facility], [place],  [place], [place] [address]    Culture (A)  Final    >=100,000 COLONIES/mL ESCHERICHIA COLI 50,000 COLONIES/mL ENTEROBACTER CLOACAE    Report Status 02/06/2019 FINAL  Final   Organism ID, Bacteria ESCHERICHIA COLI (A)  Final   Organism ID, Bacteria ENTEROBACTER CLOACAE (A)  Final      Susceptibility   Enterobacter cloacae - MIC*    CEFAZOLIN >=64 RESISTANT Resistant     CEFTRIAXONE <=1 SENSITIVE Sensitive     CIPROFLOXACIN <=0.25 SENSITIVE Sensitive     GENTAMICIN <=1 SENSITIVE Sensitive     IMIPENEM <=0.25 SENSITIVE Sensitive     NITROFURANTOIN 64 INTERMEDIATE Intermediate     TRIMETH/SULFA <=20 SENSITIVE Sensitive     PIP/TAZO <=4 SENSITIVE Sensitive     * 50,000  COLONIES/mL ENTEROBACTER CLOACAE   Escherichia coli - MIC*    AMPICILLIN >=32 RESISTANT Resistant     CEFAZOLIN <=4 SENSITIVE Sensitive     CEFTRIAXONE <=1 SENSITIVE Sensitive     CIPROFLOXACIN <=0.25 SENSITIVE Sensitive     GENTAMICIN <=1 SENSITIVE Sensitive     IMIPENEM <=0.25 SENSITIVE Sensitive     NITROFURANTOIN <=16 SENSITIVE Sensitive     TRIMETH/SULFA <=20 SENSITIVE Sensitive     AMPICILLIN/SULBACTAM 16 INTERMEDIATE Intermediate     PIP/TAZO <=4 SENSITIVE Sensitive     Extended ESBL NEGATIVE Sensitive     * >=100,000 COLONIES/mL ESCHERICHIA COLI  Urine culture     Status: Abnormal (Preliminary result)   Collection Time: 02/09/19  6:32 PM   Specimen: Urine, Random  Result Value Ref Range Status   Specimen Description   Final    URINE, RANDOM Performed at [facility], [address] [place], [place] [address]    Special Requests   Final    NONE Performed at [facility], [address] [place], [place] [address]    Culture (A)  Final    >=100,000 COLONIES/mL PSEUDOMONAS AERUGINOSA SUSCEPTIBILITIES TO FOLLOW Performed at [facility], [address] [place], [place] [address]    Report Status PENDING  Incomplete  SARS CORONAVIRUS 2 (TAT 6-24 HRS) Nasopharyngeal Nasopharyngeal Swab     Status: None   Collection Time: 02/09/19  8:18 PM   Specimen: Nasopharyngeal Swab  Result Value Ref Range Status   SARS Coronavirus 2 NEGATIVE NEGATIVE Final    Comment: (NOTE) SARS-CoV-2 target nucleic acids are NOT DETECTED. The SARS-CoV-2 RNA is generally detectable in upper and lower respiratory specimens during the acute phase of infection. Negative results do not preclude SARS-CoV-2 infection, do not rule out co-infections with other pathogens, and should not be used as the sole basis for treatment or other patient management decisions. Negative results must be combined with clinical observations, patient history, and epidemiological information. The  expected result is Negative. Fact Sheet for Patients: SugarRoll.be Fact Sheet for Healthcare Providers: https://www.woods-mathews.com/ This test is not yet approved or cleared by the [place] FDA and  has been authorized for detection and/or diagnosis of SARS-CoV-2 by FDA under an Emergency Use Authorization (EUA). This EUA will remain  in effect (meaning this test can be used) for the duration of the COVID-19 declaration under Section 56 4(b)(1) of the Act, 21 U.S.C. section 360bbb-3(b)(1), unless the authorization is terminated or revoked sooner. Performed at [facility], [place] [address] [place], [place] [address]   Culture, blood (routine x 2)     Status: None (Preliminary result)   Collection Time: 02/09/19  9:44 PM   Specimen: BLOOD  Result  Value Ref Range Status   Specimen Description BLOOD LEFT ANTECUBITAL  Final   Special Requests   Final    BOTTLES DRAWN AEROBIC AND ANAEROBIC Blood Culture adequate volume   Culture   Final    NO GROWTH 2 DAYS Performed at [facility], [address], [place], [place] [address]    Report Status PENDING  Incomplete  Culture, blood (routine x 2)     Status: None (Preliminary result)   Collection Time: 02/09/19  9:44 PM   Specimen: BLOOD  Result Value Ref Range Status   Specimen Description BLOOD RIGHT FOREARM  Final   Special Requests   Final    BOTTLES DRAWN AEROBIC AND ANAEROBIC Blood Culture results may not be optimal due to an excessive volume of blood received in culture bottles   Culture   Final    NO GROWTH 2 DAYS Performed at [facility], [address], [place],  [address]    Report Status PENDING  Incomplete     Discharge Instructions:   Discharge Instructions    Diet - low sodium heart healthy   Complete by: As directed    Face-to-face encounter (required for Medicare/Medicaid patients)   Complete by: As directed    I Shirlena Brinegar  certify that this patient is under my care and that I, or a nurse practitioner or physician's assistant working with me, had a face-to-face encounter that meets the physician face-to-face encounter requirements with this patient on 02/11/2019. The encounter with the patient was in whole, or in part for the following medical condition(s) which is the primary reason for home health care (List medical condition): Parkinson's disease, history of stroke   The encounter with the patient was in whole, or in part, for the following medical condition, which is the primary reason for home health care: Parkinson's disease, history of stroke   I certify that, based on my findings, the following services are medically necessary home health services:  Nursing Physical therapy     Reason for Medically Necessary Home Health Services:  Therapy- Personnel officer, Public librarian Skilled Nursing- Changes in Medication/Medication Management     My clinical findings support the need for the above services: Unsafe ambulation due to balance issues   Further, I certify that my clinical findings support that this patient is homebound due to: Unsafe ambulation due to balance issues   Home Health   Complete by: As directed    To provide the following care/treatments:  PT RN     Increase activity slowly   Complete by: As directed      Allergies as of 02/11/2019      Reactions   Morphine And Related Nausea And Vomiting   Synalgos-dc [aspirin-caff-dihydrocodeine] Itching, Other (See Comments)   Reaction:  Stinging    Synchlor A [chlorpheniramine] Rash, Other (See Comments)   Patient doesn't recall this allergy.      Medication List    TAKE these medications   acetaminophen 500 MG tablet Commonly known as: TYLENOL Take 1,000 mg by mouth every 6 (six) hours as needed for moderate pain or headache.   Aspirin 81 81 MG EC tablet Generic drug: aspirin Take 81 mg by mouth daily.   atorvastatin 40  MG tablet Commonly known as: LIPITOR Take 1 tablet (40 mg total) by mouth daily at 6 PM.   buPROPion 300 MG 24 hr tablet Commonly known as: WELLBUTRIN XL Take 300 mg by mouth daily. noon  carvedilol 12.5 MG tablet Commonly known as: COREG Take 1.5 tablets (18.75 mg total) by mouth 2 (two) times daily.   cephALEXin 500 MG capsule Commonly known as: KEFLEX Take 1 capsule (500 mg total) by mouth 4 (four) times daily.   clopidogrel 75 MG tablet Commonly known as: PLAVIX Take 1 tablet (75 mg total) by mouth daily with breakfast.   Combivent Respimat 20-100 MCG/ACT Aers respimat Generic drug: Ipratropium-Albuterol INHALE ONE PUFF INTO THE LUNGS EVERY 6 HOURS What changed:   how much to take  how to take this  when to take this  additional instructions   doxepin 25 MG capsule Commonly known as: SINEQUAN Take 50 mg by mouth at bedtime.   fluticasone 50 MCG/ACT nasal spray Commonly known as: FLONASE Place 2 sprays into the nose daily.   gabapentin 300 MG capsule Commonly known as: NEURONTIN Take 300 mg by mouth daily.   hydrALAZINE 50 MG tablet Commonly known as: APRESOLINE Take 1 tablet (50 mg total) by mouth 3 (three) times daily.   isosorbide mononitrate 60 MG 24 hr tablet Commonly known as: IMDUR Take 1 tablet (60 mg total) by mouth daily.   Janumet 50-1000 MG tablet Generic drug: sitaGLIPtin-metformin Take 1 tablet by mouth 2 (two) times daily. Am and pm   lisinopril 20 MG tablet Commonly known as: ZESTRIL Take 20 mg by mouth 2 (two) times daily.   LORazepam 1 MG tablet Commonly known as: ATIVAN Take 1-2 tablets (1-2 mg total) by mouth See admin instructions. Take 1 mg by mouth in the morning and take 2 mg by mouth at bedtime   nitroGLYCERIN 0.4 MG/SPRAY spray Commonly known as: NITROLINGUAL Place 1 spray under the tongue every 5 (five) minutes x 3 doses as needed for chest pain.   pantoprazole 40 MG tablet Commonly known as: PROTONIX Take 40 mg by  mouth daily.   risperiDONE 1 MG tablet Commonly known as: RISPERDAL Take 1 tablet (1 mg total) by mouth at bedtime.   Symbicort 160-4.5 MCG/ACT inhaler Generic drug: budesonide-formoterol INHALE TWO PUFFS INTO THE LUNGS TWICE DAILY   tamsulosin 0.4 MG Caps capsule Commonly known as: FLOMAX Take 1 capsule (0.4 mg total) by mouth daily.   topiramate 100 MG tablet Commonly known as: TOPAMAX Take 100 mg by mouth daily at 12 noon.         Time coordinating discharge: 32 minutes  Signed:  Jennye Boroughs  Pager [phone] [facility] 02/11/2019, 9:15 PM

## 2019-02-12 LAB — URINE CULTURE: Culture: >=100000 — AB

## 2019-02-14 ENCOUNTER — Other Ambulatory Visit: Payer: Self-pay | Admitting: *Deleted

## 2019-02-14 DIAGNOSIS — E559 Vitamin D deficiency, unspecified: Secondary | ICD-10-CM | POA: Diagnosis not present

## 2019-02-14 DIAGNOSIS — J449 Chronic obstructive pulmonary disease, unspecified: Secondary | ICD-10-CM | POA: Diagnosis not present

## 2019-02-14 DIAGNOSIS — M545 Low back pain: Secondary | ICD-10-CM | POA: Diagnosis not present

## 2019-02-14 DIAGNOSIS — D509 Iron deficiency anemia, unspecified: Secondary | ICD-10-CM | POA: Diagnosis not present

## 2019-02-14 DIAGNOSIS — I10 Essential (primary) hypertension: Secondary | ICD-10-CM | POA: Diagnosis not present

## 2019-02-14 DIAGNOSIS — Z0000 Encounter for general adult medical examination without abnormal findings: Secondary | ICD-10-CM | POA: Diagnosis not present

## 2019-02-14 DIAGNOSIS — N400 Enlarged prostate without lower urinary tract symptoms: Secondary | ICD-10-CM | POA: Diagnosis not present

## 2019-02-14 DIAGNOSIS — F329 Major depressive disorder, single episode, unspecified: Secondary | ICD-10-CM | POA: Diagnosis not present

## 2019-02-14 DIAGNOSIS — K219 Gastro-esophageal reflux disease without esophagitis: Secondary | ICD-10-CM | POA: Diagnosis not present

## 2019-02-14 LAB — CULTURE, BLOOD (ROUTINE X 2)
Culture: NO GROWTH
Culture: NO GROWTH
Special Requests: ADEQUATE

## 2019-02-14 NOTE — Patient Outreach (Signed)
[facility]) Care Management  02/14/2019  Roger Pruitt [birth year] [id number]   EMMI-GENERAL DISCHARGE RED ON EMMI ALERT Day #1 Date: 02/13/2019 Red Alert Reason:   Know who to call with any changes in condition  Outreach Call #1 RN attempted outreach however unsuccessful. RN able to leave a HIPAA approved voice message requesting a call back. Will address RED EMMI at that time and further inquired on pt's needs.  PLAN: Will schedule another outreach call within the next 4 business days and send outreach letter in hopes of a response.  Raina Mina, RN Care Management Coordinator [place] Office [phone]

## 2019-02-18 ENCOUNTER — Other Ambulatory Visit: Payer: Self-pay | Admitting: *Deleted

## 2019-02-18 NOTE — Patient Outreach (Signed)
[facility] [facility]) Care Management  02/18/2019  Roger Pruitt 09-Apr-1950 [id number]    EMMI-GENERAL DISCHARGE RED ON EMMI ALERT Day #1 &4 Date: 02/13/2019 & 02/18/2019 Red Alert Reason: #1-KNOW WHO TO CALL ABOUT CHANGES/#4 LOST INTEREST IN THINGS   Outreach #2 RN attempted outreach to pt however remains unsuccessful.  RN able to leave a HIPAA approved voice message requesting a call back.  PLAN: Will send outreach letter and attempt another outreach call within the next week.  Raina Mina, RN Care Management Coordinator [place] Office [phone]

## 2019-02-19 ENCOUNTER — Ambulatory Visit: Payer: Self-pay | Admitting: *Deleted

## 2019-02-24 NOTE — Progress Notes (Deleted)
02/25/2019 6:59 PM   Roi [name] 1950/04/13 [id number]  Referring provider: Casilda Carls, [address],  [place] [address]  No chief complaint on file.   HPI: Mr. Hutmacher is a 68 year old male with a history of hematuria, BPH with LU TS, rUTI's a prostate nodule who presents today for follow up.  Patient had cardiac stents placed on 01/15/2019.    History of hematuria (high risk) Former smoker.  Hematuria work up x 3, 2011, 2015 & 2017 - findings positive for bilateral nephrolithiasis, bilateral renal cysts and enlarged friable prostate - s/p KTP in 2016.  Contrast CT on 12/10/2018 for abdominal pain revealed marked urinary bladder distention, possible outlet obstruction.  Post TURP changes of the prostate  Nonobstructing kidney stones.  No report of gross hematuria. Recent UA's with microscopic hematuria associated with infection.  Most recent UA on 01/07/2019 negative for microscopic hematuria.    BPH WITH LUTS  (prostate and/or bladder) I PSS: ***      PVR:***       Patient had Foley placed on 01/07/2019 for a PVR of > 700cc.  Previous score: 20/5   Previous PVR: 781 mL   Major complaint(s):  Frequency, incontinence and weak stream.  Patient denies any gross hematuria, dysuria or suprapubic/flank pain.  Patient denies any fevers, chills, nausea or vomiting.     Currently taking: Flomax.  His has had KTP in 2016.    He still has not received word from the catheter company, but he has catheter samples at home.-   He does not have a family history of PCa.  rUTI's Risk factors: age, BPH, urinary retention and self cathing Recently hospitalized for UTI - urine cultures positive for Viridans streptococcus   Prostate nodule Prostate MRI pending for 1 cm slightly firm nodule in the right lobe  Component     Latest Ref Rng & Units 11/21/2014 10/26/2015 10/18/2016 10/26/2016  Prostate Specific Ag, Serum     0.0 - 4.0 ng/mL 0.4 0.6 3.3 3.0   Component     Latest Ref  Rng & Units 05/08/2017  Prostate Specific Ag, Serum     0.0 - 4.0 ng/mL 1.4   Component     Latest Ref Rng & Units 12/24/2018  Prostatic Specific Antigen     0.00 - 4.00 ng/mL 1.37    PMH: Past Medical History:  Diagnosis Date  . Abdominal pain   . Acute cystitis without hematuria   . Anemia   . Anginal pain ([place])    rarely  . Arthritis    knees and hands  . Asthma   . Bipolar disorder ([place])    BIPOLAR/ SCHIZO/ AFFECTIVE   . BPD (bronchopulmonary dysplasia)   . BPH (benign prostatic hyperplasia)   . CAD (coronary artery disease)    a. s/p MI 2001, 2012;  b. s/p prior LCX PCI; b. 05/2012 Abnl MV; c. 11/2015 Cath: 3VD-->12/2015 CABG x 4 (LIMA->LAD, VG->D1, VG->OM2, VG->RPDA); d. 12/2018 MV: mid-dist inflat ischemia. Basal inf lat infarct. EF 55%; e. 12/2018 PCI: LM nl, LAD 75/157m 80d, D1 80/90/90 (DES x 3), RI 95, LCX 99ost, 1039mOM1 100, OM2 85, RCA 45p, 6047m0/30d, RPDA 45ost/p, VG->RPDA 100, VG->D2->OM2 40, LIMA->LAD ok.  . Carpal tunnel syndrome    surgery both hands  . Depression   . Diastolic dysfunction    a. 07/2014 Echo: EF 60-65%, no rwma, nl LA size, nl RV/PASP; b. 12/2018 Echo: EF 50-55%, mod LVH. Inf  HK. Impaired relaxation. Mildly red RV fxn. Mildly dil LA. Ao root 67m.  . Disc degeneration, lumbar   . Dyspnea    WITH EXERTION  . Elevated PSA   . GERD (gastroesophageal reflux disease)   . Hematuria    history of gross and microscopic  . Hemorrhoid   . History of balanitis   . History of bladder infections   . History of urethral stricture   . Hyperlipidemia   . Hypertensive heart disease   . Incomplete bladder emptying   . Iron deficiency anemia, unspecified   . Lower urinary tract symptoms   . Myocardial infarction ([place]    X7 LAST 2001  . Neuropathy   . Nocturia   . Obsessive compulsive disorder   . Other dysphagia   . Panic attack   . Parkinson's disease ([place]   . Sleep apnea    SURGICAL TX  . Stroke ([place]   . Type II diabetes mellitus ([place]    . Unspecified vitamin D deficiency   . Urethral stricture   . Urinary retention   . Varicose vein of leg   . Vertigo    hx of  . Vitamin D deficiency   . Wears dentures    upper and lowers    Surgical History: Past Surgical History:  Procedure Laterality Date  . BACK SURGERY     lumbar disc  . BACK SURGERY  1984  . CARDIAC CATHETERIZATION  6/10;2011;Aug.2012;Oct. 2012;   . CARDIAC CATHETERIZATION  05/2012   [facility]; EF 60%, patent stent in the left circumflex, moderate three-vessel disease with no flow-limiting lesions. Unchanged from most recent catheterization  . CARDIAC CATHETERIZATION N/A 12/10/2015   Procedure: Left Heart Cath and Coronary Angiography;  Surgeon: BCorey Skains MD;  [facility];  Service: Cardiovascular;  Laterality: N/A;  . CARPAL TUNNEL RELEASE  2009   left  . CATARACT EXTRACTION W/PHACO Left 02/14/2017   Procedure: CATARACT EXTRACTION PHACO AND INTRAOCULAR LENS PLACEMENT (IOC);  Surgeon: PBirder Robson MD;  Location: [facility];  Service: Ophthalmology;  Laterality: Left;  [id number]:42 AP% 16.6 CDE 7.14 Fluid pack lot # [id number]  . CATARACT EXTRACTION W/PHACO Right 02/28/2017   Procedure: CATARACT EXTRACTION PHACO AND INTRAOCULAR LENS PLACEMENT (IOC);  Surgeon: PBirder Robson MD;  Location: [facility];  Service: Ophthalmology;  Laterality: Right;  [place]01:09 AP% 11.4 CDE 7.98 Fluid pack lot # [id number]  . COLONOSCOPY WITH PROPOFOL N/A 06/29/2017   Procedure: COLONOSCOPY WITH PROPOFOL;  Surgeon: WLucilla Lame MD;  [place] [facility]  Service: Endoscopy;  Laterality: N/A;  Diabetic - oral meds  . CORONARY ANGIOPLASTY  2011 & 2001   s/p stent  . CORONARY ARTERY BYPASS GRAFT     stent placement  . CORONARY ARTERY BYPASS GRAFT N/A 12/25/2015   Procedure: CORONARY ARTERY BYPASS GRAFTING (CABG) x4  , using left internal mammary artery and right leg greater saphenous vein harvested endoscopically LIMA-LAD SVG-OM2 SVG-DIAG SVG-PD;   Surgeon: SMelrose Nakayama MD;  [facility] [facility]  Service: Open Heart Surgery;  Laterality: N/A;  . CORONARY STENT INTERVENTION N/A 01/15/2019   Procedure: CORONARY STENT INTERVENTION;  Surgeon: ENelva Bush MD;  [facility];  Service: Cardiovascular;  Laterality: N/A;  . [place] . CYST REMOVAL LEG Right   . ESOPHAGOGASTRODUODENOSCOPY  06/29/2017   Procedure: ESOPHAGOGASTRODUODENOSCOPY (EGD);  Surgeon: WLucilla Lame MD;  [place] [facility]  Service: Endoscopy;;  . LEFT HEART CATH AND CORS/GRAFTS  ANGIOGRAPHY Left 01/15/2019   Procedure: LEFT HEART CATH AND CORS/GRAFTS ANGIOGRAPHY;  Surgeon: Nelva Bush, MD;  Location: [facility];  Service: Cardiovascular;  Laterality: Left;  . POLYPECTOMY  06/29/2017   Procedure: POLYPECTOMY INTESTINAL;  Surgeon: [name], MD;  Location: [facility];  Service: Endoscopy;;  . SKIN GRAFT    . TEE WITHOUT CARDIOVERSION N/A 12/25/2015   Procedure: TRANSESOPHAGEAL ECHOCARDIOGRAM (TEE);  Surgeon: Melrose Nakayama, MD;  Location: [facility];  Service: Open Heart Surgery;  Laterality: N/A;  . TONSILLECTOMY    . UVULOPALATOPHARYNGOPLASTY      Home Medications:  Allergies as of 02/25/2019      Reactions   Morphine And Related Nausea And Vomiting   Synalgos-dc [aspirin-caff-dihydrocodeine] Itching, Other (See Comments)   Reaction:  Stinging    Synchlor A [chlorpheniramine] Rash, Other (See Comments)   Patient doesn't recall this allergy.      Medication List       Accurate as of February 24, 2019  6:59 PM. If you have any questions, ask your nurse or doctor.        acetaminophen 500 MG tablet Commonly known as: TYLENOL Take 1,000 mg by mouth every 6 (six) hours as needed for moderate pain or headache.   Aspirin 81 81 MG EC tablet Generic drug: aspirin Take 81 mg by mouth daily.   atorvastatin 40 MG tablet Commonly known as: LIPITOR Take 1 tablet (40 mg total) by mouth daily  at 6 PM.   buPROPion 300 MG 24 hr tablet Commonly known as: WELLBUTRIN XL Take 300 mg by mouth daily. noon   carvedilol 12.5 MG tablet Commonly known as: COREG Take 1.5 tablets (18.75 mg total) by mouth 2 (two) times daily.   cephALEXin 500 MG capsule Commonly known as: KEFLEX Take 1 capsule (500 mg total) by mouth 4 (four) times daily.   clopidogrel 75 MG tablet Commonly known as: PLAVIX Take 1 tablet (75 mg total) by mouth daily with breakfast.   Combivent Respimat 20-100 MCG/ACT Aers respimat Generic drug: Ipratropium-Albuterol INHALE ONE PUFF INTO THE LUNGS EVERY 6 HOURS What changed:   how much to take  how to take this  when to take this  additional instructions   doxepin 25 MG capsule Commonly known as: SINEQUAN Take 50 mg by mouth at bedtime.   fluticasone 50 MCG/ACT nasal spray Commonly known as: FLONASE Place 2 sprays into the nose daily.   gabapentin 300 MG capsule Commonly known as: NEURONTIN Take 300 mg by mouth daily.   hydrALAZINE 50 MG tablet Commonly known as: APRESOLINE Take 1 tablet (50 mg total) by mouth 3 (three) times daily.   isosorbide mononitrate 60 MG 24 hr tablet Commonly known as: IMDUR Take 1 tablet (60 mg total) by mouth daily.   Janumet 50-1000 MG tablet Generic drug: sitaGLIPtin-metformin Take 1 tablet by mouth 2 (two) times daily. Am and pm   lisinopril 20 MG tablet Commonly known as: ZESTRIL Take 20 mg by mouth 2 (two) times daily.   LORazepam 1 MG tablet Commonly known as: ATIVAN Take 1-2 tablets (1-2 mg total) by mouth See admin instructions. Take 1 mg by mouth in the morning and take 2 mg by mouth at bedtime   nitroGLYCERIN 0.4 MG/SPRAY spray Commonly known as: NITROLINGUAL Place 1 spray under the tongue every 5 (five) minutes x 3 doses as needed for chest pain.   pantoprazole 40 MG tablet Commonly known as: PROTONIX Take 40 mg by mouth daily.  risperiDONE 1 MG tablet Commonly known as: RISPERDAL Take 1  tablet (1 mg total) by mouth at bedtime.   Symbicort 160-4.5 MCG/ACT inhaler Generic drug: budesonide-formoterol INHALE TWO PUFFS INTO THE LUNGS TWICE DAILY   tamsulosin 0.4 MG Caps capsule Commonly known as: FLOMAX Take 1 capsule (0.4 mg total) by mouth daily.   topiramate 100 MG tablet Commonly known as: TOPAMAX Take 100 mg by mouth daily at 12 noon.       Allergies:  Allergies  Allergen Reactions  . Morphine And Related Nausea And Vomiting  . Synalgos-Dc [Aspirin-Caff-Dihydrocodeine] Itching and Other (See Comments)    Reaction:  Stinging   . Synchlor A [Chlorpheniramine] Rash and Other (See Comments)    Patient doesn't recall this allergy.    Family History: Family History  Problem Relation Age of Onset  . Heart disease Mother   . Heart disease Brother   . Prostate cancer Father   . Heart disease Other   . Heart disease Brother   . Kidney disease Neg Hx   . Kidney cancer Neg Hx   . Bladder Cancer Neg Hx     Social History:  reports that he quit smoking about 19 years ago. His smoking use included cigarettes. He has a 7.50 pack-year smoking history. He has never used smokeless tobacco. He reports that he does not drink alcohol or use drugs.  ROS:                                        Physical Exam: There were no vitals taken for this visit.  Constitutional:  Well nourished. Alert and oriented, No acute distress. HEENT: [place] AT, moist mucus membranes.  Trachea midline, no masses. Cardiovascular: No clubbing, cyanosis, or edema. Respiratory: Normal respiratory effort, no increased work of breathing. Neurologic: Grossly intact, no focal deficits, moving all 4 extremities. Psychiatric: Normal mood and affect.  Laboratory Data: Urinalysis  Component     Latest Ref Rng & Units 01/07/2019  Specific Gravity, UA     1.005 - 1.030 1.010  pH, UA     5.0 - 7.5 6.5  Color, UA     Yellow Yellow  Appearance Ur     Clear Hazy (A)   Leukocytes,UA     Negative Trace (A)  Protein,UA     Negative/Trace Negative  Glucose, UA     Negative Negative  Ketones, UA     Negative Negative  RBC, UA     Negative Negative  Bilirubin, UA     Negative Negative  Urobilinogen, Ur     0.2 - 1.0 mg/dL 0.2  Nitrite, UA     Negative Negative  Microscopic Examination      See below:   Component     Latest Ref Rng & Units 01/07/2019          WBC, UA     0 - 5 /hpf 11-30 (A)  RBC     4.14 - 5.80 x10E6/uL 0-2  Epithelial Cells (non renal)     0 - 10 /hpf 0-10  Renal Epithel, UA     None seen /hpf 0-10 (A)  Bacteria, UA     None seen/Few None seen   Lab Results  Component Value Date   WBC 5.3 02/09/2019   HGB 10.3 (L) 02/09/2019   HCT 32.0 (L) 02/09/2019   MCV 86.7 02/09/2019   PLT 187  02/09/2019    Lab Results  Component Value Date   CREATININE 0.90 02/09/2019    No results found for: PSA  No results found for: TESTOSTERONE  Lab Results  Component Value Date   HGBA1C 5.5 02/09/2019    Lab Results  Component Value Date   TSH 1.191 11/30/2018       Component Value Date/Time   CHOL 90 02/09/2019 1832   CHOL 124 01/31/2014 0448   HDL 29 (L) 02/09/2019 1832   HDL 25 (L) 01/31/2014 0448   CHOLHDL 3.1 02/09/2019 1832   VLDL 13 02/09/2019 1832   VLDL 23 01/31/2014 0448   LDLCALC 48 02/09/2019 1832   LDLCALC 76 01/31/2014 0448    Lab Results  Component Value Date   AST 11 (L) 02/09/2019   Lab Results  Component Value Date   ALT 10 02/09/2019   No components found for: ALKALINEPHOPHATASE No components found for: BILIRUBINTOTAL  No results found for: ESTRADIOL  I have reviewed the labs.  Assessment & Plan:    1. rUTI's Asymptomatic at this visit Criteria for recurrent UTI has been met with 2 or more infections in 6 months or 3 or greater infections in one year  Likely due to infrequent catheterizations and retained urine We have been in conversation with the catheter company and will  follow up to ensure that they make contact with the patient and get him established with monthly catheter deliveries.                                       2. Urinary retention Foley removed today He will self cath 3 times daily.  We will postpone cystoscopy at this time until he is recovered from his cardiac caths.  He will follow-up in 1 month with IPSS and PVR.  3. History of hematuria Hematuria work up completed in 2011, 2015 and 2017 - findings positive for bilateral nephrolithiasis, bilateral renal cysts and enlarged friable prostate No report of gross hematuria   4. BPH with LUTS Continue conservative management, avoiding bladder irritants and timed voiding's Most bothersome symptoms is/are frequency and a weak urinary stream Continue tamsulosin 0.4 mg daily; refills given  5. Prostate nodule Will hold on prostate MRI for now and continue to monitor with exams and PSA's                                  No follow-ups on file.  These notes generated with voice recognition software. I apologize for typographical errors.  Zara Council, PA-C  [facility] [address]  [place], [place] [address] [phone]

## 2019-02-25 ENCOUNTER — Ambulatory Visit: Payer: Medicare Other | Admitting: Urology

## 2019-02-25 ENCOUNTER — Other Ambulatory Visit: Payer: Self-pay | Admitting: *Deleted

## 2019-02-25 NOTE — Patient Outreach (Signed)
[facility]) Care Management  02/25/2019  Devante Pennella 06/04/1950 [id number]   EMMI-General Discharge RED ON EMMI ALERT Day #1 & 4 Date:02/13/19 & 27-Feb-2019 Red Alert Reason: Know who to call about changes in condition/Lost of interest in things.   Outreach # 3 RN has made several attempts to reach pt and sent outreach letter with no response.   PLAN: Will allow time for pt to respond with an outreach however if no call back will close this case on 12/2 per protocol on case closures.  Raina Mina, RN Care Management Coordinator [place] Office [phone]

## 2019-02-26 DEATH — deceased

## 2019-02-27 ENCOUNTER — Ambulatory Visit: Payer: Medicare Other | Admitting: Urology

## 2019-02-28 ENCOUNTER — Other Ambulatory Visit: Payer: Self-pay | Admitting: *Deleted

## 2019-02-28 NOTE — Patient Outreach (Signed)
[facility]) Care Management  02/28/2019  Roger Pruitt [birth year] [id number]    RN has made several outreach attempts however unsuccessful. No response to voice messages or outreach letter to this pt. Will close this case from further outreach calls at this time and notify pt's provider of pt's disposition with [facility] services.   Raina Mina, RN Care Management Coordinator [place] Office [phone]

## 2019-04-10 ENCOUNTER — Ambulatory Visit: Payer: Medicare Other | Admitting: Nurse Practitioner
